# Patient Record
Sex: Male | Born: 1975 | Race: White | Hispanic: No | Marital: Single | Smoking: Former smoker
Health system: Southern US, Community
[De-identification: ages and names within clinical notes are randomized; demographics above are authoritative.]

## PROBLEM LIST (undated history)

## (undated) DIAGNOSIS — I5030 Unspecified diastolic (congestive) heart failure: Secondary | ICD-10-CM

## (undated) DIAGNOSIS — N509 Disorder of male genital organs, unspecified: Secondary | ICD-10-CM

## (undated) DIAGNOSIS — N183 Chronic kidney disease, stage 3 unspecified: Secondary | ICD-10-CM

## (undated) DIAGNOSIS — J449 Chronic obstructive pulmonary disease, unspecified: Secondary | ICD-10-CM

## (undated) DIAGNOSIS — I255 Ischemic cardiomyopathy: Secondary | ICD-10-CM

## (undated) DIAGNOSIS — L03119 Cellulitis of unspecified part of limb: Secondary | ICD-10-CM

## (undated) DIAGNOSIS — I2510 Atherosclerotic heart disease of native coronary artery without angina pectoris: Secondary | ICD-10-CM

## (undated) DIAGNOSIS — I10 Essential (primary) hypertension: Secondary | ICD-10-CM

## (undated) DIAGNOSIS — Z7902 Long term (current) use of antithrombotics/antiplatelets: Secondary | ICD-10-CM

## (undated) DIAGNOSIS — G4733 Obstructive sleep apnea (adult) (pediatric): Secondary | ICD-10-CM

## (undated) DIAGNOSIS — I700 Atherosclerosis of aorta: Secondary | ICD-10-CM

## (undated) DIAGNOSIS — I219 Acute myocardial infarction, unspecified: Secondary | ICD-10-CM

## (undated) DIAGNOSIS — F1910 Other psychoactive substance abuse, uncomplicated: Secondary | ICD-10-CM

## (undated) DIAGNOSIS — G4730 Sleep apnea, unspecified: Secondary | ICD-10-CM

## (undated) DIAGNOSIS — R3911 Hesitancy of micturition: Secondary | ICD-10-CM

## (undated) DIAGNOSIS — K449 Diaphragmatic hernia without obstruction or gangrene: Secondary | ICD-10-CM

## (undated) DIAGNOSIS — I209 Angina pectoris, unspecified: Secondary | ICD-10-CM

## (undated) DIAGNOSIS — D72829 Elevated white blood cell count, unspecified: Secondary | ICD-10-CM

## (undated) DIAGNOSIS — K219 Gastro-esophageal reflux disease without esophagitis: Secondary | ICD-10-CM

## (undated) DIAGNOSIS — E785 Hyperlipidemia, unspecified: Secondary | ICD-10-CM

## (undated) DIAGNOSIS — Z7982 Long term (current) use of aspirin: Secondary | ICD-10-CM

## (undated) DIAGNOSIS — A630 Anogenital (venereal) warts: Secondary | ICD-10-CM

## (undated) DIAGNOSIS — F1410 Cocaine abuse, uncomplicated: Secondary | ICD-10-CM

## (undated) DIAGNOSIS — I5032 Chronic diastolic (congestive) heart failure: Secondary | ICD-10-CM

## (undated) DIAGNOSIS — N179 Acute kidney failure, unspecified: Secondary | ICD-10-CM

## (undated) HISTORY — DX: Atherosclerotic heart disease of native coronary artery without angina pectoris: I25.10

## (undated) HISTORY — PX: IRRIGATION AND DEBRIDEMENT KNEE: SHX5185

## (undated) HISTORY — PX: TYMPANOSTOMY TUBE PLACEMENT: SHX32

---

## 2004-02-21 ENCOUNTER — Emergency Department: Payer: Self-pay | Admitting: Emergency Medicine

## 2007-09-29 ENCOUNTER — Inpatient Hospital Stay: Payer: Self-pay | Admitting: Internal Medicine

## 2008-05-10 ENCOUNTER — Emergency Department: Payer: Self-pay | Admitting: Emergency Medicine

## 2013-06-30 ENCOUNTER — Emergency Department: Payer: Self-pay | Admitting: Emergency Medicine

## 2013-06-30 LAB — URINALYSIS, COMPLETE
Bacteria: NONE SEEN
Bilirubin,UR: NEGATIVE
Glucose,UR: NEGATIVE mg/dL (ref 0–75)
Ketone: NEGATIVE
Leukocyte Esterase: NEGATIVE
Nitrite: NEGATIVE
Ph: 7 (ref 4.5–8.0)
SPECIFIC GRAVITY: 1.015 (ref 1.003–1.030)
WBC UR: <1 /HPF (ref 0–5)

## 2013-06-30 LAB — CBC WITH DIFFERENTIAL/PLATELET
Basophil #: 0.1 10*3/uL (ref 0.0–0.1)
Basophil %: 1.1 %
EOS ABS: 0.3 10*3/uL (ref 0.0–0.7)
EOS PCT: 4.7 %
HCT: 49 % (ref 40.0–52.0)
HGB: 16.7 g/dL (ref 13.0–18.0)
LYMPHS ABS: 2.1 10*3/uL (ref 1.0–3.6)
Lymphocyte %: 30 %
MCH: 32.4 pg (ref 26.0–34.0)
MCHC: 34.2 g/dL (ref 32.0–36.0)
MCV: 95 fL (ref 80–100)
Monocyte #: 0.7 x10 3/mm (ref 0.2–1.0)
Monocyte %: 10.5 %
NEUTROS ABS: 3.8 10*3/uL (ref 1.4–6.5)
Neutrophil %: 53.7 %
Platelet: 242 10*3/uL (ref 150–440)
RBC: 5.17 10*6/uL (ref 4.40–5.90)
RDW: 13.1 % (ref 11.5–14.5)
WBC: 7.1 10*3/uL (ref 3.8–10.6)

## 2013-06-30 LAB — BASIC METABOLIC PANEL
Anion Gap: 5 — ABNORMAL LOW (ref 7–16)
BUN: 9 mg/dL (ref 7–18)
CHLORIDE: 105 mmol/L (ref 98–107)
Calcium, Total: 8.8 mg/dL (ref 8.5–10.1)
Co2: 29 mmol/L (ref 21–32)
Creatinine: 1.31 mg/dL — ABNORMAL HIGH (ref 0.60–1.30)
GLUCOSE: 114 mg/dL — AB (ref 65–99)
Osmolality: 277 (ref 275–301)
POTASSIUM: 3.7 mmol/L (ref 3.5–5.1)
SODIUM: 139 mmol/L (ref 136–145)

## 2013-06-30 LAB — TROPONIN I

## 2013-10-03 ENCOUNTER — Emergency Department: Payer: Self-pay | Admitting: Student

## 2013-10-03 LAB — CBC WITH DIFFERENTIAL/PLATELET
Basophil #: 0.1 10*3/uL (ref 0.0–0.1)
Basophil %: 1.1 %
EOS PCT: 3.8 %
Eosinophil #: 0.5 10*3/uL (ref 0.0–0.7)
HCT: 48.3 % (ref 40.0–52.0)
HGB: 16.3 g/dL (ref 13.0–18.0)
Lymphocyte #: 2 10*3/uL (ref 1.0–3.6)
Lymphocyte %: 16.7 %
MCH: 32.3 pg (ref 26.0–34.0)
MCHC: 33.8 g/dL (ref 32.0–36.0)
MCV: 95 fL (ref 80–100)
MONO ABS: 0.9 x10 3/mm (ref 0.2–1.0)
MONOS PCT: 7.2 %
Neutrophil #: 8.7 10*3/uL — ABNORMAL HIGH (ref 1.4–6.5)
Neutrophil %: 71.2 %
PLATELETS: 239 10*3/uL (ref 150–440)
RBC: 5.06 10*6/uL (ref 4.40–5.90)
RDW: 12.5 % (ref 11.5–14.5)
WBC: 12.2 10*3/uL — AB (ref 3.8–10.6)

## 2013-10-03 LAB — BASIC METABOLIC PANEL
Anion Gap: 8 (ref 7–16)
BUN: 12 mg/dL (ref 7–18)
CO2: 26 mmol/L (ref 21–32)
Calcium, Total: 8.4 mg/dL — ABNORMAL LOW (ref 8.5–10.1)
Chloride: 106 mmol/L (ref 98–107)
Creatinine: 1.39 mg/dL — ABNORMAL HIGH (ref 0.60–1.30)
EGFR (African American): >60
EGFR (Non-African Amer.): >60
Glucose: 80 mg/dL (ref 65–99)
Osmolality: 278 (ref 275–301)
POTASSIUM: 3.8 mmol/L (ref 3.5–5.1)
SODIUM: 140 mmol/L (ref 136–145)

## 2013-10-08 LAB — CULTURE, BLOOD (SINGLE)

## 2013-11-28 ENCOUNTER — Emergency Department: Payer: Self-pay | Admitting: Emergency Medicine

## 2014-10-20 DIAGNOSIS — I10 Essential (primary) hypertension: Secondary | ICD-10-CM | POA: Diagnosis present

## 2015-05-11 IMAGING — CR RIGHT MIDDLE FINGER 2+V
1 series · 3 of 3 positions shown · non-contrast
Comparison: None.

CLINICAL DATA: Pain and swelling after getting metal in middle
finger last week.

EXAM:
RIGHT MIDDLE FINGER 2+V

[Series 1: pa · 0.17mm/px · 3 of 3 slices shown]
[im 1/3]
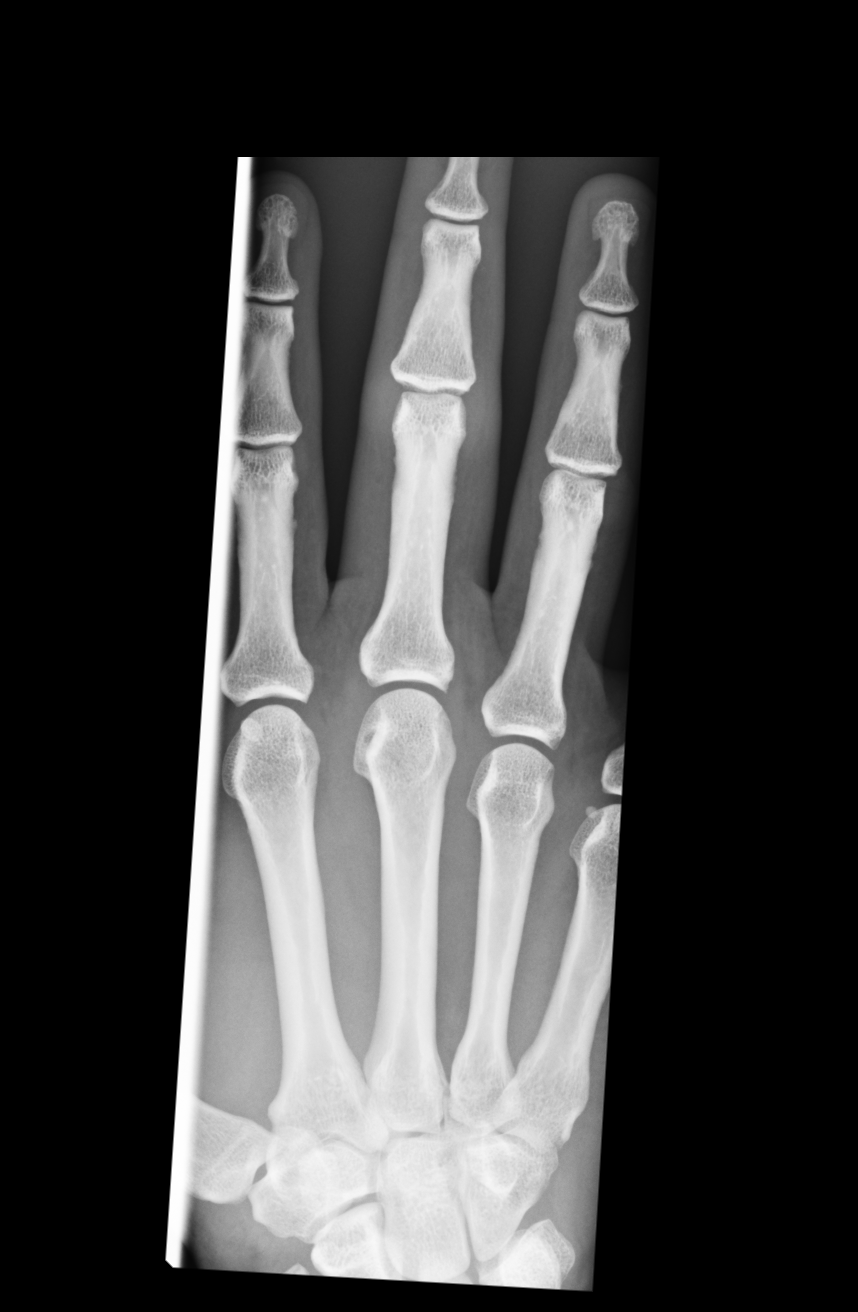
[im 2/3]
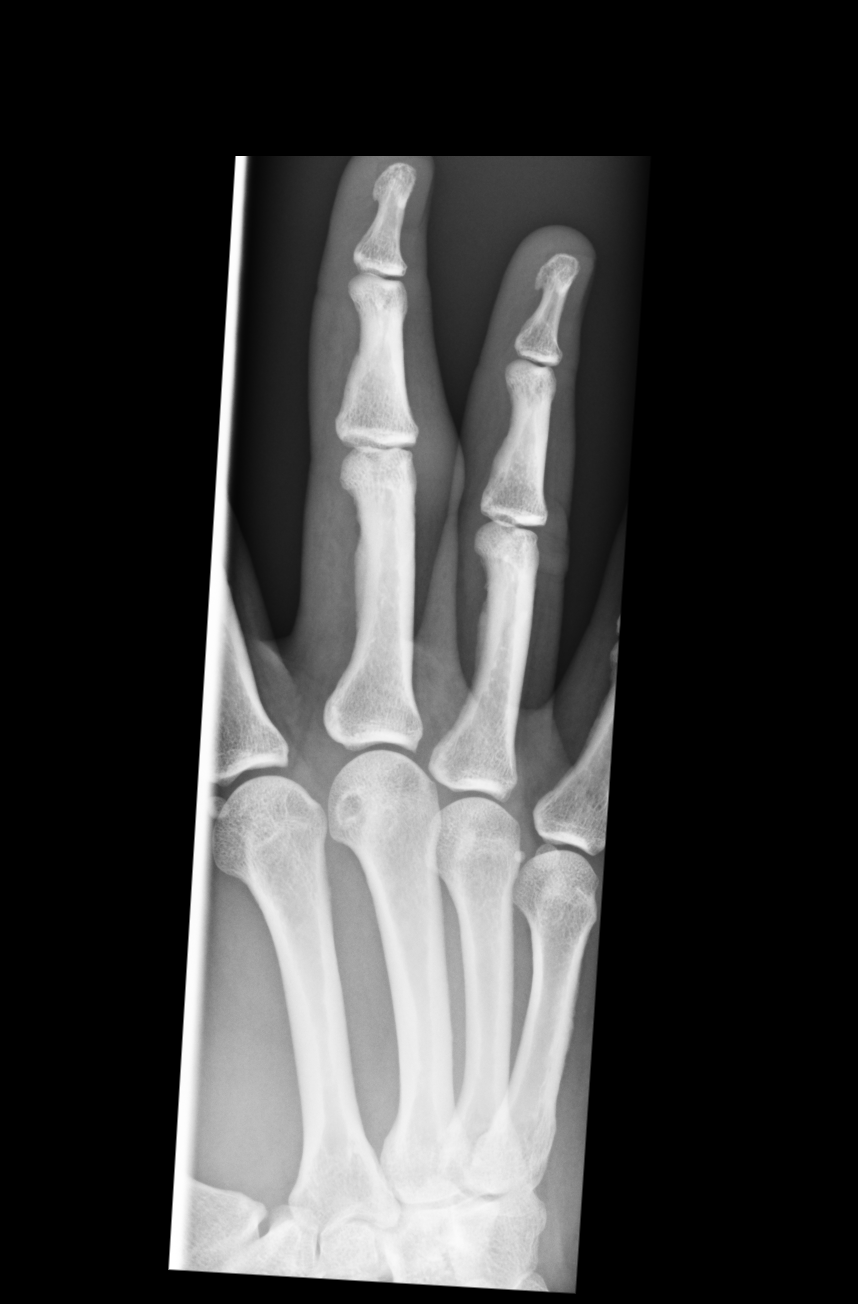
[im 3/3]
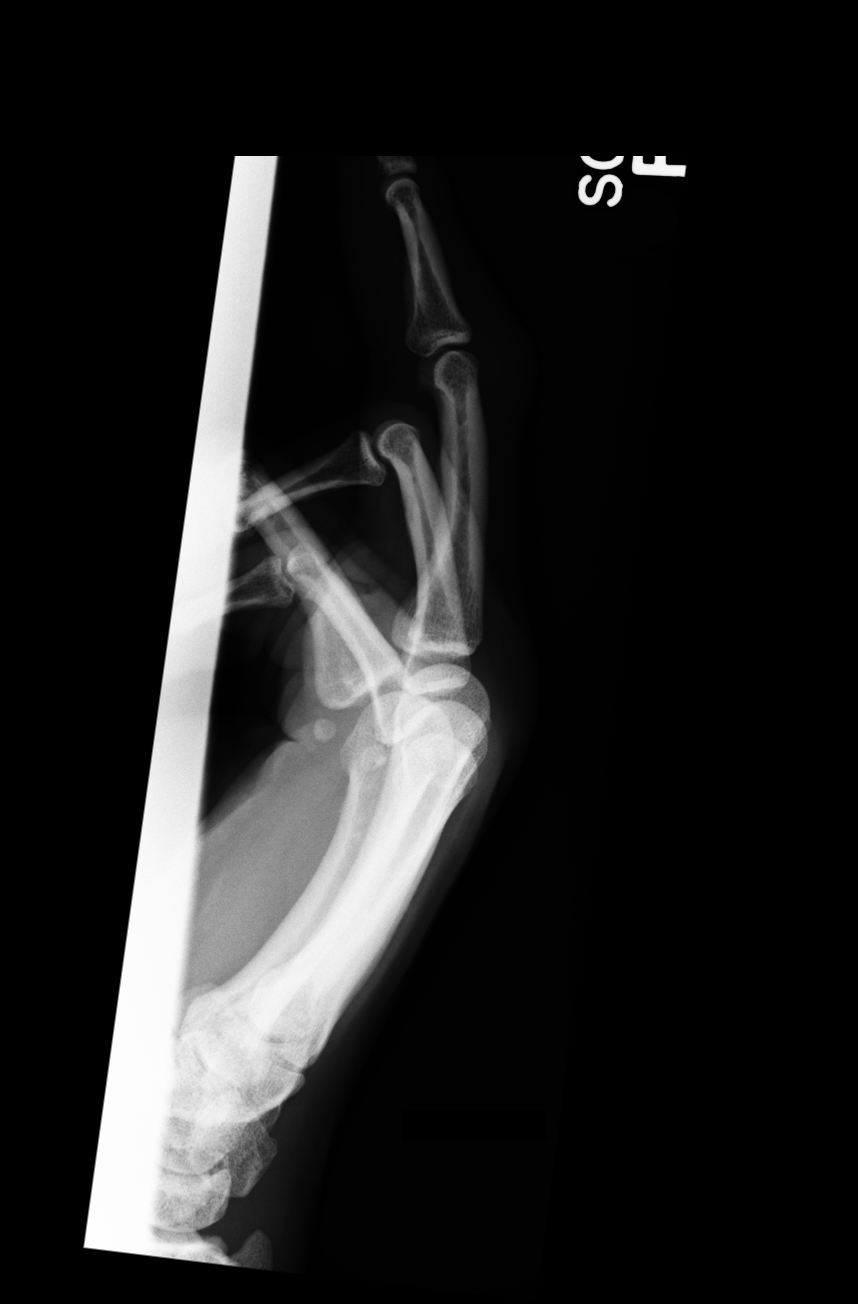

[3 of 3 positions shown; findings below may reference images not displayed]

FINDINGS: Soft tissue swelling, centered about the proximal interphalangeal
joint. No osseous destruction, fracture, or dislocation. Lateral
view is overpenetrated. No evidence of radiopaque or metallic
foreign object.
IMPRESSION: Soft tissue swelling, without radiopaque foreign object.

## 2016-07-10 DIAGNOSIS — E785 Hyperlipidemia, unspecified: Secondary | ICD-10-CM | POA: Diagnosis present

## 2016-07-10 DIAGNOSIS — E7849 Other hyperlipidemia: Secondary | ICD-10-CM | POA: Diagnosis present

## 2017-01-24 ENCOUNTER — Encounter: Payer: Self-pay | Admitting: Emergency Medicine

## 2017-01-24 ENCOUNTER — Emergency Department
Admission: EM | Admit: 2017-01-24 | Discharge: 2017-01-24 | Disposition: A | Discharge status: Home / Self Care | Payer: No Typology Code available for payment source | Attending: Emergency Medicine | Admitting: Emergency Medicine

## 2017-01-24 ENCOUNTER — Emergency Department: Payer: No Typology Code available for payment source

## 2017-01-24 ENCOUNTER — Other Ambulatory Visit: Payer: Self-pay

## 2017-01-24 DIAGNOSIS — Y92410 Unspecified street and highway as the place of occurrence of the external cause: Secondary | ICD-10-CM | POA: Insufficient documentation

## 2017-01-24 DIAGNOSIS — Y9389 Activity, other specified: Secondary | ICD-10-CM | POA: Diagnosis not present

## 2017-01-24 DIAGNOSIS — S39012A Strain of muscle, fascia and tendon of lower back, initial encounter: Secondary | ICD-10-CM | POA: Insufficient documentation

## 2017-01-24 DIAGNOSIS — Y999 Unspecified external cause status: Secondary | ICD-10-CM | POA: Diagnosis not present

## 2017-01-24 DIAGNOSIS — I10 Essential (primary) hypertension: Secondary | ICD-10-CM | POA: Diagnosis not present

## 2017-01-24 DIAGNOSIS — F17210 Nicotine dependence, cigarettes, uncomplicated: Secondary | ICD-10-CM | POA: Diagnosis not present

## 2017-01-24 DIAGNOSIS — S0990XA Unspecified injury of head, initial encounter: Secondary | ICD-10-CM | POA: Diagnosis present

## 2017-01-24 DIAGNOSIS — S161XXA Strain of muscle, fascia and tendon at neck level, initial encounter: Secondary | ICD-10-CM | POA: Diagnosis not present

## 2017-01-24 HISTORY — DX: Essential (primary) hypertension: I10

## 2017-01-24 MED ORDER — OXYCODONE-ACETAMINOPHEN 5-325 MG PO TABS
1.0000 | ORAL_TABLET | Freq: Once | ORAL | Status: AC
  Administered 2017-01-24: 1 via ORAL
  Filled 2017-01-24: qty 1

## 2017-01-24 MED ORDER — CYCLOBENZAPRINE HCL 10 MG PO TABS: 10.0000 mg | ORAL_TABLET | Freq: Three times a day (TID) | ORAL | 0 refills | Status: DC | PRN
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
## 2017-01-24 NOTE — ED Notes (Signed)

## 2019-11-25 ENCOUNTER — Emergency Department: Payer: HRSA Program

## 2019-11-25 ENCOUNTER — Observation Stay
Admission: EM | Admit: 2019-11-25 | Discharge: 2019-11-27 | Disposition: A | Discharge status: Home / Self Care | Payer: HRSA Program | Attending: Internal Medicine | Admitting: Internal Medicine

## 2019-11-25 ENCOUNTER — Other Ambulatory Visit: Payer: Self-pay

## 2019-11-25 DIAGNOSIS — R9431 Abnormal electrocardiogram [ECG] [EKG]: Secondary | ICD-10-CM | POA: Diagnosis not present

## 2019-11-25 DIAGNOSIS — U071 COVID-19: Secondary | ICD-10-CM

## 2019-11-25 DIAGNOSIS — N489 Disorder of penis, unspecified: Secondary | ICD-10-CM | POA: Diagnosis present

## 2019-11-25 DIAGNOSIS — R739 Hyperglycemia, unspecified: Secondary | ICD-10-CM | POA: Diagnosis present

## 2019-11-25 DIAGNOSIS — L03115 Cellulitis of right lower limb: Secondary | ICD-10-CM | POA: Diagnosis not present

## 2019-11-25 DIAGNOSIS — I10 Essential (primary) hypertension: Secondary | ICD-10-CM | POA: Diagnosis present

## 2019-11-25 DIAGNOSIS — E785 Hyperlipidemia, unspecified: Secondary | ICD-10-CM | POA: Diagnosis present

## 2019-11-25 DIAGNOSIS — R062 Wheezing: Secondary | ICD-10-CM | POA: Diagnosis present

## 2019-11-25 DIAGNOSIS — R55 Syncope and collapse: Secondary | ICD-10-CM | POA: Diagnosis present

## 2019-11-25 DIAGNOSIS — L03119 Cellulitis of unspecified part of limb: Secondary | ICD-10-CM | POA: Diagnosis present

## 2019-11-25 DIAGNOSIS — N4889 Other specified disorders of penis: Secondary | ICD-10-CM | POA: Diagnosis not present

## 2019-11-25 DIAGNOSIS — Z720 Tobacco use: Secondary | ICD-10-CM | POA: Diagnosis present

## 2019-11-25 DIAGNOSIS — Z79899 Other long term (current) drug therapy: Secondary | ICD-10-CM | POA: Insufficient documentation

## 2019-11-25 DIAGNOSIS — R7989 Other specified abnormal findings of blood chemistry: Secondary | ICD-10-CM | POA: Diagnosis present

## 2019-11-25 DIAGNOSIS — Z8616 Personal history of COVID-19: Secondary | ICD-10-CM

## 2019-11-25 DIAGNOSIS — E663 Overweight: Secondary | ICD-10-CM

## 2019-11-25 DIAGNOSIS — E7849 Other hyperlipidemia: Secondary | ICD-10-CM | POA: Diagnosis present

## 2019-11-25 DIAGNOSIS — Z6827 Body mass index (BMI) 27.0-27.9, adult: Secondary | ICD-10-CM

## 2019-11-25 DIAGNOSIS — Z87891 Personal history of nicotine dependence: Secondary | ICD-10-CM | POA: Diagnosis present

## 2019-11-25 DIAGNOSIS — R778 Other specified abnormalities of plasma proteins: Secondary | ICD-10-CM | POA: Diagnosis present

## 2019-11-25 HISTORY — DX: Hyperlipidemia, unspecified: E78.5

## 2019-11-25 HISTORY — DX: Personal history of COVID-19: Z86.16

## 2019-11-25 HISTORY — DX: Tobacco use: Z72.0

## 2019-11-25 HISTORY — DX: COVID-19: U07.1

## 2019-11-25 LAB — URINALYSIS, COMPLETE (UACMP) WITH MICROSCOPIC
Bacteria, UA: NONE SEEN
Bilirubin Urine: NEGATIVE
Glucose, UA: NEGATIVE mg/dL
Ketones, ur: NEGATIVE mg/dL
Leukocytes,Ua: NEGATIVE
Nitrite: NEGATIVE
Protein, ur: 30 mg/dL — AB
Specific Gravity, Urine: 1.018 (ref 1.005–1.030)
pH: 5 (ref 5.0–8.0)

## 2019-11-25 LAB — CBC
HCT: 48 % (ref 39.0–52.0)
Hemoglobin: 16.7 g/dL (ref 13.0–17.0)
MCH: 30.7 pg (ref 26.0–34.0)
MCHC: 34.8 g/dL (ref 30.0–36.0)
MCV: 88.2 fL (ref 80.0–100.0)
Platelets: 263 10*3/uL (ref 150–400)
RBC: 5.44 MIL/uL (ref 4.22–5.81)
RDW: 12.7 % (ref 11.5–15.5)
WBC: 5.6 10*3/uL (ref 4.0–10.5)
nRBC: 0 % (ref 0.0–0.2)

## 2019-11-25 LAB — HEPATIC FUNCTION PANEL
ALT: 18 U/L (ref 0–44)
AST: 20 U/L (ref 15–41)
Albumin: 3.7 g/dL (ref 3.5–5.0)
Alkaline Phosphatase: 74 U/L (ref 38–126)
Bilirubin, Direct: <0.1 mg/dL (ref 0.0–0.2)
Total Bilirubin: 0.5 mg/dL (ref 0.3–1.2)
Total Protein: 7.1 g/dL (ref 6.5–8.1)

## 2019-11-25 LAB — BASIC METABOLIC PANEL
Anion gap: 10 (ref 5–15)
BUN: 17 mg/dL (ref 6–20)
CO2: 22 mmol/L (ref 22–32)
Calcium: 9.1 mg/dL (ref 8.9–10.3)
Chloride: 107 mmol/L (ref 98–111)
Creatinine, Ser: 1.09 mg/dL (ref 0.61–1.24)
GFR calc Af Amer: >60 mL/min (ref >60)
GFR calc non Af Amer: >60 mL/min (ref >60)
Glucose, Bld: 114 mg/dL — ABNORMAL HIGH (ref 70–99)
Potassium: 4.2 mmol/L (ref 3.5–5.1)
Sodium: 139 mmol/L (ref 135–145)

## 2019-11-25 LAB — RESPIRATORY PANEL BY RT PCR (FLU A&B, COVID)
Influenza A by PCR: NEGATIVE
Influenza B by PCR: NEGATIVE
SARS Coronavirus 2 by RT PCR: POSITIVE — AB

## 2019-11-25 LAB — TROPONIN I (HIGH SENSITIVITY)
Troponin I (High Sensitivity): 16 ng/L (ref <18)
Troponin I (High Sensitivity): 32 ng/L — ABNORMAL HIGH (ref <18)
Troponin I (High Sensitivity): 24 ng/L — ABNORMAL HIGH (ref <18)

## 2019-11-25 LAB — BRAIN NATRIURETIC PEPTIDE: B Natriuretic Peptide: 27.6 pg/mL (ref 0.0–100.0)

## 2019-11-25 MED ORDER — METHYLPREDNISOLONE SODIUM SUCC 125 MG IJ SOLR: 80.0000 mg | Freq: Once | INTRAMUSCULAR | Status: DC | PRN

## 2019-11-25 MED ORDER — ENOXAPARIN SODIUM 40 MG/0.4ML ~~LOC~~ SOLN
40.0000 mg | SUBCUTANEOUS | Status: DC
  Administered 2019-11-25 – 2019-11-26 (×2): 40 mg via SUBCUTANEOUS
  Filled 2019-11-25 (×2): qty 0.4

## 2019-11-25 MED ORDER — FAMOTIDINE IN NACL 20-0.9 MG/50ML-% IV SOLN: 20.0000 mg | Freq: Once | INTRAVENOUS | Status: DC | PRN

## 2019-11-25 MED ORDER — ONDANSETRON HCL 4 MG/2ML IJ SOLN: 4.0000 mg | Freq: Four times a day (QID) | INTRAMUSCULAR | Status: DC | PRN

## 2019-11-25 MED ORDER — ACETAMINOPHEN 650 MG RE SUPP: 650.0000 mg | Freq: Four times a day (QID) | RECTAL | Status: DC | PRN

## 2019-11-25 MED ORDER — ACETAMINOPHEN 325 MG PO TABS: 650.0000 mg | ORAL_TABLET | Freq: Four times a day (QID) | ORAL | Status: DC | PRN

## 2019-11-25 MED ORDER — METHYLPREDNISOLONE SODIUM SUCC 125 MG IJ SOLR: 125.0000 mg | Freq: Once | INTRAMUSCULAR | Status: DC | PRN

## 2019-11-25 MED ORDER — HYDRALAZINE HCL 20 MG/ML IJ SOLN
10.0000 mg | Freq: Four times a day (QID) | INTRAMUSCULAR | Status: DC | PRN
  Administered 2019-11-25 – 2019-11-26 (×2): 10 mg via INTRAVENOUS
  Filled 2019-11-25 (×2): qty 1

## 2019-11-25 MED ORDER — ONDANSETRON HCL 4 MG PO TABS: 4.0000 mg | ORAL_TABLET | Freq: Four times a day (QID) | ORAL | Status: DC | PRN

## 2019-11-25 MED ORDER — LABETALOL HCL 5 MG/ML IV SOLN
10.0000 mg | INTRAVENOUS | Status: DC | PRN
  Administered 2019-11-26 (×2): 10 mg via INTRAVENOUS
  Filled 2019-11-25 (×2): qty 4

## 2019-11-25 MED ORDER — CEPHALEXIN 500 MG PO CAPS: 500.0000 mg | ORAL_CAPSULE | Freq: Three times a day (TID) | ORAL | Status: DC

## 2019-11-25 MED ORDER — METHYLPREDNISOLONE SODIUM SUCC 40 MG IJ SOLR
40.0000 mg | Freq: Once | INTRAMUSCULAR | Status: AC
  Administered 2019-11-25: 22:00:00 40 mg via INTRAVENOUS
  Filled 2019-11-25: qty 1

## 2019-11-25 MED ORDER — ENOXAPARIN SODIUM 40 MG/0.4ML ~~LOC~~ SOLN: 40.0000 mg | SUBCUTANEOUS | Status: DC

## 2019-11-25 MED ORDER — EPINEPHRINE 0.3 MG/0.3ML IJ SOAJ
0.3000 mg | Freq: Once | INTRAMUSCULAR | Status: DC | PRN
  Filled 2019-11-25: qty 0.3

## 2019-11-25 MED ORDER — CEPHALEXIN 500 MG PO CAPS
500.0000 mg | ORAL_CAPSULE | Freq: Four times a day (QID) | ORAL | Status: DC
  Administered 2019-11-25 – 2019-11-27 (×8): 500 mg via ORAL
  Filled 2019-11-25 (×7): qty 1

## 2019-11-25 MED ORDER — ALBUTEROL SULFATE HFA 108 (90 BASE) MCG/ACT IN AERS
2.0000 | INHALATION_SPRAY | Freq: Once | RESPIRATORY_TRACT | Status: DC | PRN
  Filled 2019-11-25: qty 6.7

## 2019-11-25 MED ORDER — SODIUM CHLORIDE 0.9% FLUSH
3.0000 mL | Freq: Two times a day (BID) | INTRAVENOUS | Status: DC
  Administered 2019-11-25 – 2019-11-27 (×4): 3 mL via INTRAVENOUS

## 2019-11-25 MED ORDER — NICOTINE 21 MG/24HR TD PT24: 21.0000 mg | MEDICATED_PATCH | Freq: Every day | TRANSDERMAL | Status: DC | PRN

## 2019-11-25 MED ORDER — SODIUM CHLORIDE 0.9 % IV SOLN
1200.0000 mg | Freq: Once | INTRAVENOUS | Status: AC
  Administered 2019-11-25: 22:00:00 1200 mg via INTRAVENOUS
  Filled 2019-11-25: qty 10

## 2019-11-25 MED ORDER — SODIUM CHLORIDE 0.9 % IV SOLN: INTRAVENOUS | Status: DC | PRN

## 2019-11-25 MED ORDER — MELATONIN 5 MG PO TABS
5.0000 mg | ORAL_TABLET | Freq: Every day | ORAL | Status: DC
  Administered 2019-11-26 (×2): 5 mg via ORAL
  Filled 2019-11-25 (×3): qty 1

## 2019-11-25 MED ORDER — IPRATROPIUM-ALBUTEROL 20-100 MCG/ACT IN AERS
2.0000 | INHALATION_SPRAY | Freq: Four times a day (QID) | RESPIRATORY_TRACT | Status: DC
  Administered 2019-11-25 – 2019-11-27 (×7): 2 via RESPIRATORY_TRACT
  Filled 2019-11-25: qty 4

## 2019-11-25 MED ORDER — DIPHENHYDRAMINE HCL 50 MG/ML IJ SOLN: 50.0000 mg | Freq: Once | INTRAMUSCULAR | Status: DC | PRN

## 2019-11-25 NOTE — ED Notes (Signed)
First Nurse Note: Pt to ED from [facility] for syncopal episode last night and multiple pre syncopal episodes this morning. Pt is currently c/o dizziness

## 2019-11-25 NOTE — ED Notes (Signed)
See triage note  Presents after having a syncopal episode last pm  But states he felt like he was going to pass out several times today

## 2019-11-25 NOTE — ED Notes (Signed)
Repeat EKG completed per request of MD Isaacs.

## 2019-11-25 NOTE — ED Triage Notes (Signed)
Pt comes via POV from home with c/o LOC. Pt states yesterday he was just sitting on couch watching TV and passed out. Pt states pain to left arm. Pt states no CP really.  Pt states some SOB.

## 2019-11-25 NOTE — H&P (Signed)
History and Physical    Logan Crawford TKZ:[id number] DOB: [birth year] DOA: 11/25/2019  PCP: Pcp, No   Patient coming from: Home.   I have personally briefly reviewed patient's old medical records in [facility]  Chief Complaint: Loss of consciousness.  HPI: Logan Crawford is a 44 y.o. male with medical history significant of hypertension, hyperlipidemia, tobacco use who is coming to the emergency department with a history of progressively worse weakness for the past 2 to 3 weeks associated with diarrhea earlier in the course of it, fatigue, dyspnea, wheezing, occasional nonproductive cough (particularly since yesterday) which he thinks led to him losing consciousness at home while he was sitting in his couch.  He denies any prodromal symptoms like palpitations, dizziness, diaphoresis or dyspnea.  Today he felt pressure-like, left chest pain radiated to his left arm associated with fainting sensation, but did not lose consciousness.  His chest pain resolved shortly after that.  He was chest pain-free by the time he arrived to the emergency department. He denies PND, orthopnea or pitting edema of the lower extremities. He denies fever, but complains of chills occasional night sweats.  No rhinorrhea, sore throat, but state he gets wheezing often.  He smokes almost a pack of cigarettes a day.  No abdominal pain, nausea, emesis, constipation, melena or hematochezia.  Denies dysuria, frequency hematuria.  No polyuria, polydipsia, polyphagia or blurred vision.  He states that he has a recurrent skin lesion on his inner right thigh that occasionally gets painful, swells up and then drains.  This is also affecting his glans.  He mentioned that he had a similar picture years ago, was treated with antibiotics and refer to the dermatologist for the penile lesion, which he says is mostly painless.  ED Course: His temperature was 98 F, pulse 96, RR 16, blood pressure 132/91 mmHg O2 sat 100%.  Cardiology  was consulted (Dr. Gala Romney) who suggested a repeat troponin and echocardiogram.  Discharge home if normal, but his second troponin doubled up on the second troponin measurement.  Urinalysis shows small hemoglobinuria and proteinuria 30 mg/dL.  Covid 19 PCR was positive.  CBC was normal.  BMP showed a glucose of 114 mg/dL, but all other values were normal.  LFTs were normal.  Troponin was 16 and then 32 ng/L.  BNP was 27.6 pg/mL.  EKG is sinus rhythm, but is showing now T wave inversion on the lateral leads when compared from his previous EKG from 2015.  His chest radiograph did not have any acute cardiopulmonary normality.  Review of Systems: As per HPI otherwise all other systems reviewed and are negative.  Past Medical History:  Diagnosis Date  . Hyperlipidemia   . Hypertension   . Tobacco use 11/25/2019   Past Surgical History:  Procedure Laterality Date  . IRRIGATION AND DEBRIDEMENT KNEE Left   . TYMPANOSTOMY TUBE PLACEMENT Bilateral    Social History  reports that he has been smoking. He has never used smokeless tobacco. He reports current alcohol use. He reports that he does not use drugs.  No Known Allergies  Family History  Problem Relation Age of Onset  . Coronary artery disease Mother   . Arrhythmia Mother        Pacemaker placed in 2021  . Coronary artery disease Father    Prior to Admission medications   Medication Sig Start Date End Date Taking? Authorizing Provider  amLODipine (NORVASC) 10 MG tablet Take 10 mg by mouth daily.   Yes [provider]  atorvastatin (LIPITOR) 20 MG tablet Take 20 mg by mouth daily.   Yes [provider]  sertraline (ZOLOFT) 50 MG tablet Take 50 mg by mouth daily.   Yes [provider]   Physical Exam: Vitals:   11/25/19 1209 11/25/19 1809 11/25/19 2027 11/25/19 2105  BP:  130/88 (!) 177/115 (!) 187/128  Pulse:  88 85 83  Resp:  16 18 16   Temp:    (!) 97.4 F (36.3 C)  TempSrc:    Oral  SpO2:  100% 99%    Weight: 90.7 kg     Height: 6' (1.829 m)      Constitutional: NAD, calm, comfortable Eyes: PERRL, lids and conjunctivae normal mildly injected. ENMT: Mucous membranes are moist. Posterior pharynx clear of any exudate or lesions. Neck: normal, supple, no masses, no thyromegaly Respiratory: Mildly decreased breath sounds with bilateral wheezing and rhonchi, no crackles. Normal respiratory effort. No accessory muscle use.  Cardiovascular: Regular rate and rhythm, no murmurs / rubs / gallops. No extremity edema. 2+ pedal pulses. No carotid bruits.  Abdomen:  Nondistended.  BS positive.  Soft, no tenderness, no masses palpated. No hepatosplenomegaly. GU:  Small punctuated erythematous lesion of the penile glans.  (See picture below) Musculoskeletal: no clubbing / cyanosis.  Good ROM, no contractures. Normal muscle tone.  Skin: There is 3 x 2 cm mildly erythematosus, mildly edematous and tender to palpation area in the patient's right inguinal.  Please see picture below Neurologic: CN 2-12 grossly intact. Sensation intact, DTR normal. Strength 5/5 in all 4.  Psychiatric: Normal judgment and insight. Alert and oriented x 3. Normal mood.         Labs on Admission: I have personally reviewed following labs and imaging studies  CBC: Recent Labs  Lab 11/25/19 1207  WBC 5.6  HGB 16.7  HCT 48.0  MCV 88.2  PLT 263   Basic Metabolic Panel: Recent Labs  Lab 11/25/19 1207  NA 139  K 4.2  CL 107  CO2 22  GLUCOSE 114*  BUN 17  CREATININE 1.09  CALCIUM 9.1   GFR: Estimated Creatinine Clearance: 93.9 mL/min (by C-G formula based on SCr of 1.09 mg/dL).  Liver Function Tests: Recent Labs  Lab 11/25/19 1920  AST 20  ALT 18  ALKPHOS 74  BILITOT 0.5  PROT 7.1  ALBUMIN 3.7   Urine analysis:    Component Value Date/Time   COLORURINE YELLOW (A) 11/25/2019 1759   APPEARANCEUR CLEAR (A) 11/25/2019 1759        LABSPEC 1.018 11/25/2019 1759        PHURINE 5.0 11/25/2019 1759    GLUCOSEU NEGATIVE 11/25/2019 1759        HGBUR SMALL (A) 11/25/2019 1759   BILIRUBINUR NEGATIVE 11/25/2019 1759        KETONESUR NEGATIVE 11/25/2019 1759   PROTEINUR 30 (A) 11/25/2019 1759   NITRITE NEGATIVE 11/25/2019 1759   LEUKOCYTESUR NEGATIVE 11/25/2019 1759        Radiological Exams on Admission: DG Chest 1 View  Result Date: 11/25/2019 CLINICAL DATA:  COVID LOC EXAM: CHEST  1 VIEW COMPARISON:  None. FINDINGS: The heart size and mediastinal contours are within normal limits. Both lungs are clear. The visualized skeletal structures are unremarkable. IMPRESSION: No active disease. Electronically Signed   By: 01/25/2020 M.D.   On: 11/25/2019 15:47   EKG: Independently reviewed.  Vent. rate 86 BPM PR interval 140 ms QRS duration 70 ms QT/QTc 362/433 ms P-R-T axes  63 54 150 Normal sinus rhythm ST & T wave abnormality, consider inferolateral ischemia Abnormal ECG  Assessment/Plan Principal Problem:   Syncopal episodes Observation/telemetry. Fall precaution. Trend troponin level. Check echocardiogram in a.m. Consult cardiology in a.m.  Active Problems:   Elevated troponin   Abnormal EKG Multiple risk factors for CAD: (Age, tobacco use, hypertension, hyperlipidemia, overweight) (Strong nonmodifiable risk factors with CAD history in both parents) Smoking cessation advised multiple times. Resume treatment for hypertension and hyperlipidemia. Lifestyle modifications. Consult cardiology in the morning.    COVID-19 virus infection No significant signs of respiratory disease. His symptoms have mostly been generalized and gastrointestinal. He came to the hospital due to syncopal episode/chest pain. His oxygen saturation and chest radiograph are normal. Monoclonal antibody infusion was ordered.    Wheezing Early lower COVID-19 infection?. Tobacco related chronic airway/lung disease. Smoking cessation advised. Solu-Medrol 40 mg IVP x1 dose. Combivent 2 puffs every 6  hours.    Tobacco use Tobacco cessation strongly advised. Declined nicotine replacement therapy at the moment. Nuclear patch 20 mg daily ordered as needed. Staff to provide tobacco cessation information.    Recurrent cellulitis of thigh Occasional edema and purulent discharge. Cephalexin 500 mg p.o. every 6 hours. Local care of the affected area.. May benefit from weight loss.    Penile lesion Not sure where there is correlation with tight lesion. Check RPR, HIV, hep B surface antigen and antibody. Follow as an outpatient with public health or return to dermatology.    Hyperglycemia This is not a fasting level. Repeat fasting glucose in a.m.    Hypertension Has not taken amlodipine in a while. Restart 5 mg p.o. daily. Monitor blood pressure.    Hyperlipidemia LFTs are normal. Resume atorvastatin 20 mg p.o. daily.    Overweight with body mass index (BMI) of 27 to 27.9 in adult Lifestyle modifications advised given risk for cardiovascular disease.   DVT prophylaxis: Lovenox SQ.   Code Status:   Full code. Family Communication:   Disposition Plan:   Patient is from:  Home.  Anticipated DC to:  Home.  Anticipated DC date:  11/26/2019.  Anticipated DC barriers: Clinical status.  Consults called: Admission status:  Observation/telemetry.  Severity of Illness:  Bobette Mo MD [facility]  How to contact the [facility] Attending or Consulting provider 7A - 7P or covering provider during after hours 7P -7A, for this patient?   1. Check the care team in [facility] and look for a) attending/consulting [facility] provider listed and b) the [facility] team listed 2. Log into [email or web address] and use [facility] universal password to access. If you do not have the password, please contact the hospital operator. 3. Locate the [facility] provider you are looking for under [facility] and page to a number that you can be directly reached. 4. If you still have difficulty reaching the  provider, please page the [facility] (Director on Call) for the Hospitalists listed on amion for assistance.  11/25/2019, 9:14 PM   This document was prepared using Dragon voice recognition software and may contain some unintended transcription.

## 2019-11-25 NOTE — Progress Notes (Signed)
Pharmacy COVID-19 Monoclonal Antibody Screening  Logan Crawford was identified as being not hospitalized with symptoms from Covid-19 on admission but an incidental positive PCR has been documented.  The patient may qualify for the use of monoclonal antibodies (mAB) for COVID-19 viral infection to prevent worsening symptoms stemming from Covid-19 infection.  The patient was identified based on a positive COVID-19 PCR and not requiring the use of supplemental oxygen at this time.  This patient meets the FDA criteria for Emergency Use Authorization of casirivimab/imdevimab or bamlanivimab/etesevimab.  Has a (+) direct SARS-CoV-2 viral test result  Is NOT hospitalized due to COVID-19  Is within 10 days of symptom onset  Has at least one of the high risk factor(s) for progression to severe COVID-19 and/or hospitalization as defined in EUA.  Specific high risk criteria : BMI > 25 and Cardiovascular disease or hypertension  Additionally: The patient has not had a positive COVID-19 PCR in the last 90 days.  The patient is unvaccinated against COVID-19.  Since the patient is unvaccinated and meets high risk criteria, the patient is eligible for mAB administration.   This eligibility and indication for treatment was discussed with the patient's physician: Dr. Robb Matar  Plan: Based on the above discussion, it was decided that the patient will receive one dose of the available COVID-19 mAB combination. Pharmacy will coordinate administration timing with patient's nurse. Recommended infusion monitoring parameters communicated to the nursing team.   [name] 11/25/2019  8:48 PM

## 2019-11-25 NOTE — Plan of Care (Signed)
  Problem: Education: Goal: Knowledge of risk factors and measures for prevention of condition will improve Outcome: Progressing   Problem: Coping: Goal: Psychosocial and spiritual needs will be supported Outcome: Progressing   Problem: Respiratory: Goal: Will maintain a patent airway Outcome: Progressing Goal: Complications related to the disease process, condition or treatment will be avoided or minimized Outcome: Progressing   

## 2019-11-25 NOTE — ED Notes (Signed)
Messaged NP regarding BP. Awaiting orders.

## 2019-11-25 NOTE — ED Provider Notes (Signed)
[facility] Emergency Department Provider Note  Time seen: 8:58 PM  I have reviewed the triage vital signs and the nursing notes.   HISTORY  Chief Complaint Loss of Consciousness   HPI Logan Crawford is a 44 y.o. male with a past medical history of hypertension, hyperlipidemia, presents to the emergency department  for syncopal event.  According to the patient had a loss of consciousness yesterday while sitting on the couch.  States he has had some pain to his left chest and left arm and today felt lightheaded like he was going to pass out again.  Patient does state he has had a slight cough for the past 24 hours but states over the past 2 weeks he has been feeling extremely fatigued.  Denies any chest pain currently.  Denies any fever.  No vomiting or diarrhea.  Past Medical History:  Diagnosis Date  . Hyperlipidemia   . Hypertension   . Tobacco use 11/25/2019    Patient Active Problem List   Diagnosis Date Noted  . Syncopal episodes 11/25/2019  . Tobacco use 11/25/2019  . Hyperglycemia 11/25/2019  . COVID-19 virus infection 11/25/2019  . Elevated troponin 11/25/2019  . Abnormal EKG 11/25/2019  . Wheezing 11/25/2019  . Hypertension   . Hyperlipidemia   . Overweight with body mass index (BMI) of 27 to 27.9 in adult     Past Surgical History:  Procedure Laterality Date  . IRRIGATION AND DEBRIDEMENT KNEE Left   . TYMPANOSTOMY TUBE PLACEMENT Bilateral     Prior to Admission medications   Medication Sig Start Date End Date Taking? Authorizing Provider  amLODipine (NORVASC) 10 MG tablet Take 10 mg by mouth daily.   Yes [provider]  atorvastatin (LIPITOR) 20 MG tablet Take 20 mg by mouth daily.   Yes [provider]  sertraline (ZOLOFT) 50 MG tablet Take 50 mg by mouth daily.   Yes [provider]    No Known Allergies  Family History  Problem Relation Age of Onset  . Coronary artery disease Mother   . Arrhythmia  Mother        Pacemaker placed in 2021  . Coronary artery disease Father     Social History Social History   Tobacco Use  . Smoking status: Current Some Day Smoker  . Smokeless tobacco: Never Used  Substance Use Topics  . Alcohol use: Yes    Comment: rarely  . Drug use: No    Review of Systems Constitutional: Negative for fever.  Positive for generalized fatigue. Cardiovascular: Chest tightness yesterday, now resolved.  Syncopal episode yesterday. Respiratory: Negative for shortness of breath.  Slight cough x2 to 3 days. Gastrointestinal: Negative for abdominal pain, vomiting and diarrhea. Musculoskeletal: Negative for musculoskeletal complaints Neurological: Negative for headache All other ROS negative  ____________________________________________   PHYSICAL EXAM:  VITAL SIGNS: ED Triage Vitals  Enc Vitals Group     BP 11/25/19 1204 (!) 132/91     Pulse Rate 11/25/19 1204 96     Resp 11/25/19 1204 18     Temp 11/25/19 1204 98 F (36.7 C)     Temp src --      SpO2 11/25/19 1204 100 %     Weight 11/25/19 1209 200 lb (90.7 kg)     Height 11/25/19 1209 6' (1.829 m)     Head Circumference --      Peak Flow --      Pain Score 11/25/19 1206 0  Pain Loc --      Pain Edu? --      Excl. in GC? --     Constitutional: Alert and oriented. Well appearing and in no distress. Eyes: Normal exam ENT      Head: Normocephalic and atraumatic.      Mouth/Throat: Mucous membranes are moist. Cardiovascular: Normal rate, regular rhythm. Respiratory: Normal respiratory effort without tachypnea nor retractions. Breath sounds are clear  Gastrointestinal: Soft and nontender. No distention.  Musculoskeletal: Nontender with normal range of motion in all extremities.  Neurologic:  Normal speech and language. No gross focal neurologic deficits Skin:  Skin is warm, dry and intact.  Psychiatric: Mood and affect are normal.  ____________________________________________     EKG  EKG viewed and interpreted by myself shows a sinus rhythm 86 bpm with a narrow QRS, normal axis, normal intervals.  Patient has lateral T wave inversions, new from prior EKG 5+ years ago.  ____________________________________________    RADIOLOGY  X-rays negative  ____________________________________________   INITIAL IMPRESSION / ASSESSMENT AND PLAN / ED COURSE  Pertinent labs & imaging results that were available during my care of the patient were reviewed by me and considered in my medical decision making (see chart for details).   Patient presents to the emergency department with symptoms of generalized fatigue x1 to 2 weeks, syncopal episode with chest discomfort yesterday, lightheadedness again today.  Patient has not been vaccinated against Covid, Covid test today is positive.  Patient's initial troponin of 16 however given the patient's EKG findings which are changed from his prior I spoke to Dr. Adriana Reams who recommends repeat heart enzyme if unchanged could be worked up as an outpatient.  However patient's repeat enzyme has doubled to 32.  Given the patient's increasing troponin with EKG changes and Covid positive status we will admit to the hospitalist service for further work-up and treatment and likely echocardiogram.  Patient agreeable to plan of care.  Logan Crawford was evaluated in Emergency Department on 11/25/2019 for the symptoms described in the history of present illness. He was evaluated in the context of the global COVID-19 pandemic, which necessitated consideration that the patient might be at risk for infection with the SARS-CoV-2 virus that causes COVID-19. Institutional protocols and algorithms that pertain to the evaluation of patients at risk for COVID-19 are in a state of rapid change based on information released by regulatory bodies including the CDC and federal and state organizations. These policies and algorithms were followed during the patient's  care in the ED.  ____________________________________________   FINAL CLINICAL IMPRESSION(S) / ED DIAGNOSES  COVID-19 Elevated troponin Abnormal EKG   Minna Antis, MD 11/25/19 2102

## 2019-11-26 ENCOUNTER — Observation Stay
Admit: 2019-11-26 | Discharge: 2019-11-26 | Disposition: A | Discharge status: Home / Self Care | Payer: HRSA Program | Attending: Internal Medicine | Admitting: Internal Medicine

## 2019-11-26 DIAGNOSIS — R55 Syncope and collapse: Secondary | ICD-10-CM | POA: Diagnosis not present

## 2019-11-26 DIAGNOSIS — R778 Other specified abnormalities of plasma proteins: Secondary | ICD-10-CM | POA: Diagnosis not present

## 2019-11-26 DIAGNOSIS — L03119 Cellulitis of unspecified part of limb: Secondary | ICD-10-CM

## 2019-11-26 DIAGNOSIS — U071 COVID-19: Secondary | ICD-10-CM | POA: Diagnosis not present

## 2019-11-26 DIAGNOSIS — E663 Overweight: Secondary | ICD-10-CM

## 2019-11-26 DIAGNOSIS — R9431 Abnormal electrocardiogram [ECG] [EKG]: Secondary | ICD-10-CM | POA: Diagnosis not present

## 2019-11-26 DIAGNOSIS — N489 Disorder of penis, unspecified: Secondary | ICD-10-CM

## 2019-11-26 DIAGNOSIS — Z6827 Body mass index (BMI) 27.0-27.9, adult: Secondary | ICD-10-CM

## 2019-11-26 DIAGNOSIS — R739 Hyperglycemia, unspecified: Secondary | ICD-10-CM

## 2019-11-26 DIAGNOSIS — Z720 Tobacco use: Secondary | ICD-10-CM

## 2019-11-26 DIAGNOSIS — I10 Essential (primary) hypertension: Secondary | ICD-10-CM

## 2019-11-26 DIAGNOSIS — E785 Hyperlipidemia, unspecified: Secondary | ICD-10-CM

## 2019-11-26 LAB — LIPID PANEL
Cholesterol: 266 mg/dL — ABNORMAL HIGH (ref 0–200)
HDL: 46 mg/dL (ref >40)
LDL Cholesterol: 191 mg/dL — ABNORMAL HIGH (ref 0–99)
Total CHOL/HDL Ratio: 5.8 RATIO
Triglycerides: 145 mg/dL (ref <150)
VLDL: 29 mg/dL (ref 0–40)

## 2019-11-26 LAB — ECHOCARDIOGRAM COMPLETE
AR max vel: 1.59 cm2
AV Area VTI: 1.81 cm2
AV Area mean vel: 1.84 cm2
AV Mean grad: 4 mmHg
AV Peak grad: 7.1 mmHg
Ao pk vel: 1.33 m/s
Height: 72 in
S' Lateral: 2.87 cm
Weight: 3200 oz

## 2019-11-26 LAB — HIV ANTIBODY (ROUTINE TESTING W REFLEX): HIV Screen 4th Generation wRfx: NONREACTIVE

## 2019-11-26 LAB — HEPATITIS B SURFACE ANTIGEN: Hepatitis B Surface Ag: NONREACTIVE

## 2019-11-26 LAB — TROPONIN I (HIGH SENSITIVITY): Troponin I (High Sensitivity): 13 ng/L (ref <18)

## 2019-11-26 LAB — HEMOGLOBIN A1C
Hgb A1c MFr Bld: 5.7 % — ABNORMAL HIGH (ref 4.8–5.6)
Mean Plasma Glucose: 116.89 mg/dL

## 2019-11-26 MED ORDER — ATORVASTATIN CALCIUM 20 MG PO TABS
20.0000 mg | ORAL_TABLET | Freq: Every day | ORAL | Status: DC
  Administered 2019-11-26: 14:00:00 20 mg via ORAL
  Filled 2019-11-26: qty 1

## 2019-11-26 MED ORDER — POLYETHYLENE GLYCOL 3350 17 G PO PACK: 17.0000 g | PACK | Freq: Every evening | ORAL | Status: DC | PRN

## 2019-11-26 MED ORDER — PANTOPRAZOLE SODIUM 40 MG PO TBEC
40.0000 mg | DELAYED_RELEASE_TABLET | Freq: Every day | ORAL | Status: DC
  Administered 2019-11-26 – 2019-11-27 (×2): 40 mg via ORAL
  Filled 2019-11-26 (×2): qty 1

## 2019-11-26 MED ORDER — POLYETHYLENE GLYCOL 3350 17 G PO PACK: 17.0000 g | PACK | Freq: Every day | ORAL | Status: DC | PRN

## 2019-11-26 MED ORDER — ALUM & MAG HYDROXIDE-SIMETH 200-200-20 MG/5ML PO SUSP
30.0000 mL | Freq: Four times a day (QID) | ORAL | Status: DC | PRN
  Administered 2019-11-26: 12:00:00 30 mL via ORAL
  Filled 2019-11-26: qty 30

## 2019-11-26 MED ORDER — LISINOPRIL 20 MG PO TABS
20.0000 mg | ORAL_TABLET | Freq: Every day | ORAL | Status: DC
  Administered 2019-11-26 – 2019-11-27 (×2): 20 mg via ORAL
  Filled 2019-11-26 (×2): qty 1

## 2019-11-26 MED ORDER — AMLODIPINE BESYLATE 10 MG PO TABS
10.0000 mg | ORAL_TABLET | Freq: Every day | ORAL | Status: DC
  Administered 2019-11-26 – 2019-11-27 (×2): 10 mg via ORAL
  Filled 2019-11-26 (×2): qty 1

## 2019-11-26 NOTE — Progress Notes (Signed)
   11/26/19 1135  Assess: MEWS Score  Temp 98 F (36.7 C)  BP (!) 196/98  Pulse Rate (!) 115  Resp 20  Level of Consciousness Alert  SpO2 100 %  O2 Device Room Air  Assess: MEWS Score  MEWS Temp 0  MEWS Systolic 0  MEWS Pulse 2  MEWS RR 0  MEWS LOC 0  MEWS Score 2  MEWS Score Color Yellow  Assess: if the MEWS score is Yellow or Red  Were vital signs taken at a resting state? Yes  Focused Assessment No change from prior assessment  Early Detection of Sepsis Score *See Row Information* Low  MEWS guidelines implemented *See Row Information* Yes  Treat  MEWS Interventions Administered prn meds/treatments  Pain Scale 0-10  Pain Score 0  Take Vital Signs  Increase Vital Sign Frequency  Yellow: Q 2hr X 2 then Q 4hr X 2, if remains yellow, continue Q 4hrs  Escalate  MEWS: Escalate Yellow: discuss with charge nurse/RN and consider discussing with provider and RRT  Notify: Charge Nurse/RN  Name of Charge Nurse/RN Notified Sharlett Lienemann Maisie Fus  Date Charge Nurse/RN Notified 11/26/19  Time Charge Nurse/RN Notified 1135  previously gave PRN IV hydralazine for BP; will give another PRN IV medication for BP

## 2019-11-26 NOTE — Progress Notes (Signed)
PROGRESS NOTE    Logan Crawford  ZOX:[id number] DOB: 1975-10-05 DOA: 11/25/2019 PCP: Oneita Hurt, No    Brief Narrative:  Logan Crawford is a 44 year old male with past medical history notable for essential hypertension, hyperlipidemia, tobacco abuse who presented to the emergency department with progressive weakness over the past 2-3 weeks associated with diarrhea earlier in the course, fatigue, dyspnea and nonproductive cough.  Symptoms progressed up to patient having presyncopal episode.  Patient also reported pressure-like left-sided chest pain radiation to left arm that resolved quickly.  Patient also reports skin lesion to right inner thigh that has been draining, has been present for multiple years.  Also has been referred to a dermatologist for a penile lesion that is painless.  In the ED, temperature 98.0, HR 96, RR 16, BP 132/91, SPO2 100% on room air.  Cardiology was consulted, Dr. Gala Romney who recommended trending troponin with an echocardiogram; and to discharge home if normal.  Urinalysis with small hemoglobinuria and proteinuria.  Covid-19 PCR positive.  LFTs within normal limits.  Troponin XVI, 32.  BNP 27.6.  EKG NSR.  Chest x-ray negative for acute cardiopulmonary disease process.  Hospitalist service consulted for further evaluation and treatment of Covid-19 viral pneumonia, presyncope, chest pain, groin lesion.   Assessment & Plan:   Principal Problem:   Syncopal episodes Active Problems:   Tobacco use   Hyperglycemia   COVID-19 virus infection   Elevated troponin   Abnormal EKG   Hypertension   Hyperlipidemia   Overweight with body mass index (BMI) of 27 to 27.9 in adult   Wheezing   Recurrent cellulitis of thigh   Penile lesion   Atypical chest pain Elevated troponin Patient presenting with chest pain with associated radiation to left arm that has resolved following presentation to ED.  Troponin elevated 16, 32, 24, 13.  EKG with normal sinus rhythm, rate 93, QTc  442 with T wave inversions in lead I, aVL, II, V4-V6; which is changed in comparison to previous EKG dated 06/30/2013.  BNP within normal limits.  Patient with risk factors of tobacco use, hypertension, hyperlipidemia, overweight and CAD in both parents. --TTE pending --Repeat EKG --Check lipid panel --continue to monitor on telemetry  Right groin lesion/cellulitis --Keflex 500 mg p.o. every 6 hours  Penile lesion --HIV/RPR pending --Outpatient follow-up with dermatology  Covid-19 viral infection Covid-19 PCR + 11/25/2019.  Unvaccinated.  Asymptomatic with no signs of respiratory disease.  Was having previous GI discomfort with diarrhea.  This x-ray unrevealing.  Oxygenating well on room air.  Patient received a monoclonal antibody infusion on 11/25/2019.  Recommend receive vaccination in 90 days.  Essential hypertension BP 157/97.  On amlodipine 10 mg p.o. daily at home. --Restart amlodipine 10 mg p.o. daily  Hyperlipidemia --Check lipid panel --Atorvastatin 20 mg p.o. daily  Tobacco use disorder Counseled on need for complete cessation.  Nicotine patch   DVT prophylaxis: Lovenox Code Status: Full code Family Communication: Updated patient extensively at bedside  Disposition Plan:  Status is: Observation  The patient remains OBS appropriate and will d/c before 2 midnights.  Dispo: The patient is from: Home              Anticipated d/c is to: Home              Anticipated d/c date is: 1 day              Patient currently is not medically stable to d/c.     Consultants:  Cardiology, Bensimhon; discussed with the ED provider  Procedures:   TTE: Pending  Antimicrobials:   Keflex 10/1   Subjective: Patient seen and examined at bedside, resting comfortably.  No complaints this morning.  States TTE recently accomplished this morning.  Denies headache, no current chest pain, no palpitations, no shortness of breath, no abdominal pain.  No acute events overnight per  nursing staff.  Objective: Vitals:   11/26/19 0837 11/26/19 1055 11/26/19 1135 11/26/19 1152  BP: (!) 164/112 (!) 156/94 (!) 196/98 (!) 162/87  Pulse: 92  (!) 115 (!) 104  Resp: 18  20 11   Temp: 98.5 F (36.9 C)  98 F (36.7 C)   TempSrc: Oral  Oral   SpO2: 99%  100%   Weight:      Height:       No intake or output data in the 24 hours ending 11/26/19 1154 Filed Weights   11/25/19 1209  Weight: 90.7 kg    Examination:  General exam: Appears calm and comfortable  Respiratory system: Clear to auscultation. Respiratory effort normal.  Oxygenating well on room air Cardiovascular system: S1 & S2 heard, RRR. No JVD, murmurs, rubs, gallops or clicks. No pedal edema. Gastrointestinal system: Abdomen is nondistended, soft and nontender. No organomegaly or masses felt. Normal bowel sounds heard. Central nervous system: Alert and oriented. No focal neurological deficits. Extremities: Symmetric 5 x 5 power. Skin: See pictures below regarding skin lesions Psychiatry: Judgement and insight appear normal. Mood & affect appropriate.         Data Reviewed: I have personally reviewed following labs and imaging studies  CBC: Recent Labs  Lab 11/25/19 1207  WBC 5.6  HGB 16.7  HCT 48.0  MCV 88.2  PLT 263   Basic Metabolic Panel: Recent Labs  Lab 11/25/19 1207  NA 139  K 4.2  CL 107  CO2 22  GLUCOSE 114*  BUN 17  CREATININE 1.09  CALCIUM 9.1   GFR: Estimated Creatinine Clearance: 93.9 mL/min (by C-G formula based on SCr of 1.09 mg/dL). Liver Function Tests: Recent Labs  Lab 11/25/19 1920  AST 20  ALT 18  ALKPHOS 74  BILITOT 0.5  PROT 7.1  ALBUMIN 3.7   No results for input(s): LIPASE, AMYLASE in the last 168 hours. No results for input(s): AMMONIA in the last 168 hours. Coagulation Profile: No results for input(s): INR, PROTIME in the last 168 hours. Cardiac Enzymes: No results for input(s): CKTOTAL, CKMB, CKMBINDEX, TROPONINI in the last 168 hours. BNP  (last 3 results) No results for input(s): PROBNP in the last 8760 hours. HbA1C: No results for input(s): HGBA1C in the last 72 hours. CBG: No results for input(s): GLUCAP in the last 168 hours. Lipid Profile: No results for input(s): CHOL, HDL, LDLCALC, TRIG, CHOLHDL, LDLDIRECT in the last 72 hours. Thyroid Function Tests: No results for input(s): TSH, T4TOTAL, FREET4, T3FREE, THYROIDAB in the last 72 hours. Anemia Panel: No results for input(s): VITAMINB12, FOLATE, FERRITIN, TIBC, IRON, RETICCTPCT in the last 72 hours. Sepsis Labs: No results for input(s): PROCALCITON, LATICACIDVEN in the last 168 hours.  Recent Results (from the past 240 hour(s))  Respiratory Panel by RT PCR (Flu A&B, Covid) - Nasopharyngeal Swab     Status: Abnormal   Collection Time: 11/25/19 12:24 PM   Specimen: Nasopharyngeal Swab  Result Value Ref Range Status   SARS Coronavirus 2 by RT PCR POSITIVE (A) NEGATIVE Final    Comment: RESULT CALLED TO, READ BACK BY AND VERIFIED WITH: ANGELA  ROBBINS AT 1316 ON 11/25/2019 [facility]. (NOTE) SARS-CoV-2 target nucleic acids are DETECTED.  SARS-CoV-2 RNA is generally detectable in upper respiratory specimens  during the acute phase of infection. Positive results are indicative of the presence of the identified virus, but do not rule out bacterial infection or co-infection with other pathogens not detected by the test. Clinical correlation with patient history and other diagnostic information is necessary to determine patient infection status. The expected result is Negative.  Fact Sheet for Patients:  https://www.moore.com/  Fact Sheet for Healthcare Providers: https://www.young.biz/  This test is not yet approved or cleared by the [place] FDA and  has been authorized for detection and/or diagnosis of SARS-CoV-2 by FDA under an Emergency Use Authorization (EUA).  This EUA will remain in effect (meaning this test c an be  used) for the duration of  the COVID-19 declaration under Section 564(b)(1) of the Act, 21 U.S.C. section 360bbb-3(b)(1), unless the authorization is terminated or revoked sooner.      Influenza A by PCR NEGATIVE NEGATIVE Final   Influenza B by PCR NEGATIVE NEGATIVE Final    Comment: (NOTE) The Xpert Xpress SARS-CoV-2/FLU/RSV assay is intended as an aid in  the diagnosis of influenza from Nasopharyngeal swab specimens and  should not be used as a sole basis for treatment. Nasal washings and  aspirates are unacceptable for Xpert Xpress SARS-CoV-2/FLU/RSV  testing.  Fact Sheet for Patients: https://www.moore.com/  Fact Sheet for Healthcare Providers: https://www.young.biz/  This test is not yet approved or cleared by the [place] FDA and  has been authorized for detection and/or diagnosis of SARS-CoV-2 by  FDA under an Emergency Use Authorization (EUA). This EUA will remain  in effect (meaning this test can be used) for the duration of the  Covid-19 declaration under Section 564(b)(1) of the Act, 21  U.S.C. section 360bbb-3(b)(1), unless the authorization is  terminated or revoked. Performed at [facility], [address], [place], [place] [address]          Radiology Studies: DG Chest 1 View  Result Date: 11/25/2019 CLINICAL DATA:  COVID LOC EXAM: CHEST  1 VIEW COMPARISON:  None. FINDINGS: The heart size and mediastinal contours are within normal limits. Both lungs are clear. The visualized skeletal structures are unremarkable. IMPRESSION: No active disease. Electronically Signed   By: Jasmine Pang M.D.   On: 11/25/2019 15:47        Scheduled Meds: . cephALEXin  500 mg Oral Q6H WA  . enoxaparin (LOVENOX) injection  40 mg Subcutaneous Q24H  . Ipratropium-Albuterol  2 puff Inhalation Q6H  . melatonin  5 mg Oral QHS  . sodium chloride flush  3 mL Intravenous Q12H   Continuous Infusions: . sodium chloride    .  famotidine (PEPCID) IV       LOS: 0 days    Time spent: 38 minutes spent on chart review, discussion with nursing staff, consultants, updating family and interview/physical exam; more than 50% of that time was spent in counseling and/or coordination of care.    Alvira Philips [place], DO [facility] Available via Epic secure chat 7am-7pm After these hours, please refer to coverage provider listed on [email or web address] 11/26/2019, 11:54 AM

## 2019-11-26 NOTE — Progress Notes (Signed)
Dr [place] ordered in [facility] Norvasc for pt, EKG once and Lipitor due to lab results for add ons with cholesterol and LDL

## 2019-11-26 NOTE — Progress Notes (Signed)
   11/26/19 1147  Treat  Complains of Other (Comment) (blood pressure)  Interventions Medication (see MAR)  gave PRN IV labetalol for BP

## 2019-11-26 NOTE — Progress Notes (Signed)
   11/26/19 1145  Treat  Complains of Heartburn  Interventions Medication (see MAR)  gave PRN PO Maalox for pt c/o heartburn

## 2019-11-27 DIAGNOSIS — R062 Wheezing: Secondary | ICD-10-CM

## 2019-11-27 DIAGNOSIS — U071 COVID-19: Secondary | ICD-10-CM | POA: Diagnosis not present

## 2019-11-27 DIAGNOSIS — R55 Syncope and collapse: Secondary | ICD-10-CM | POA: Diagnosis not present

## 2019-11-27 DIAGNOSIS — R9431 Abnormal electrocardiogram [ECG] [EKG]: Secondary | ICD-10-CM | POA: Diagnosis not present

## 2019-11-27 DIAGNOSIS — R778 Other specified abnormalities of plasma proteins: Secondary | ICD-10-CM | POA: Diagnosis not present

## 2019-11-27 LAB — RPR: RPR Ser Ql: NONREACTIVE

## 2019-11-27 LAB — HEPATITIS B SURFACE ANTIBODY, QUANTITATIVE: Hep B S AB Quant (Post): <3.1 m[IU]/mL — ABNORMAL LOW (ref >9.9)

## 2019-11-27 MED ORDER — ALBUTEROL SULFATE HFA 108 (90 BASE) MCG/ACT IN AERS: 2.0000 | INHALATION_SPRAY | Freq: Four times a day (QID) | RESPIRATORY_TRACT | 0 refills | Status: DC | PRN

## 2019-11-27 MED ORDER — CEPHALEXIN 500 MG PO CAPS: 500.0000 mg | ORAL_CAPSULE | Freq: Four times a day (QID) | ORAL | 0 refills | Status: AC

## 2019-11-27 MED ORDER — ATORVASTATIN CALCIUM 40 MG PO TABS: 40.0000 mg | ORAL_TABLET | Freq: Every day | ORAL | 0 refills | Status: DC

## 2019-11-27 MED ORDER — AMLODIPINE BESYLATE 10 MG PO TABS: 10.0000 mg | ORAL_TABLET | Freq: Every day | ORAL | 0 refills | Status: DC

## 2019-11-27 MED ORDER — LISINOPRIL 20 MG PO TABS: 20.0000 mg | ORAL_TABLET | Freq: Every day | ORAL | 0 refills | Status: DC

## 2019-11-27 MED ORDER — PANTOPRAZOLE SODIUM 40 MG PO TBEC: 40.0000 mg | DELAYED_RELEASE_TABLET | Freq: Every day | ORAL | 0 refills | Status: DC

## 2019-11-27 MED ORDER — ATORVASTATIN CALCIUM 20 MG PO TABS
40.0000 mg | ORAL_TABLET | Freq: Every day | ORAL | Status: DC
  Administered 2019-11-27: 40 mg via ORAL
  Filled 2019-11-27: qty 2

## 2019-11-27 NOTE — Evaluation (Signed)
Physical Therapy Evaluation Patient Details Name: Logan Crawford MRN: [id number] DOB: [birth year] Today's Date: 11/27/2019   History of Present Illness  Patient is a 44 year old male with past medical history notable for essential hypertension, hyperlipidemia, tobacco abuse who presented to the emergency department with progressive weakness over the past 2-3 weeks associated with diarrhea earlier in the course, fatigue, dyspnea and nonproductive cough.  Symptoms progressed up to patient having presyncopal episode.  Patient also reported pressure-like left-sided chest pain radiation to left arm that resolved quickly.  Patient also reports skin lesion to right inner thigh that has been draining, has been present for multiple years.  Also has been referred to a dermatologist for a penile lesion that is painless. COVID-19 virus infection with monoclonal antibody infusion on 11/25/2019  Clinical Impression  PT evaluation completed. Patient reports no pain or syncopal episodes recently. Patient agreeable to progress activity as able with physical therapy as patient reports he has not ambulated outside the room. Patient is independent with bed mobility and sit to stand transfer from bed. Patient ambulated around the nursing station on room air without loss of balance and with no assistive device. Occasional cues for safety, to decrease cadence, and for energy conservation techniques. Patient reports mild dizziness after walking with Sp02 89% on room and and heart rate 122bpm. Sp02 increased into the low 90's within a minute of seated rest break and dizziness improved. Patient is anticipated to discharge later today. No further acute PT needs at this time.      Follow Up Recommendations No PT follow up    Equipment Recommendations  None recommended by PT    Recommendations for Other Services       Precautions / Restrictions Precautions Precautions: Fall Restrictions Weight Bearing Restrictions: No       Mobility  Bed Mobility Overal bed mobility: Independent                Transfers Overall transfer level: Independent               General transfer comment: for sit to stand transfers to and from bed   Ambulation/Gait Ambulation/Gait assistance: Modified independent (Device/Increase time) Gait Distance (Feet): 360 Feet Assistive device: None Gait Pattern/deviations: WFL(Within Functional Limits)     General Gait Details: Patient ambulated around nursing unit and in the room without loss of balance with no assistive device. Sp02 89% after walking with heart rate 122bpm. Patient does report mild dizziness after ambulating that subsided with seated rest break. Educated patient to slowly progress activity as tolerated, importance of deep breathing, and taking rest breaks as needed with daily tasks for endurance management at discharge. Cues for safety with ambulation provided   Stairs            Wheelchair Mobility    Modified Rankin (Stroke Patients Only)       Balance Overall balance assessment: Needs assistance Sitting-balance support: Feet unsupported Sitting balance-Leahy Scale: Normal     Standing balance support: No upper extremity supported;During functional activity Standing balance-Leahy Scale: Normal Standing balance comment: patient is able to reach outside base of support without UE support and maintian balance without difficulty with functional ambulation and dynamic standing tasks.                              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       Higher education careers adviser        Extremity/Trunk Assessment   Upper Extremity Assessment Upper Extremity Assessment: Overall WFL for tasks assessed    Lower Extremity Assessment Lower Extremity Assessment: Overall WFL for tasks assessed       Communication   Communication: No difficulties  Cognition Arousal/Alertness: Awake/alert Behavior During Therapy: WFL for tasks assessed/performed Overall Cognitive Status: Within Functional Limits for tasks assessed                                        General Comments      Exercises     Assessment/Plan    PT Assessment Patent does not need any further PT services  PT Problem List         PT Treatment Interventions      PT Goals (Current goals can be found in the Care Plan section)  Acute Rehab PT Goals Patient Stated Goal: to go home  PT Goal Formulation: With patient Time For Goal Achievement: 11/27/19 Potential to Achieve Goals: Good    Frequency     Barriers to discharge        Co-evaluation               AM-PAC PT "6 Clicks" Mobility  Outcome Measure Help needed turning from your back to your side while in a flat bed without using bedrails?: None Help needed moving from lying on your back to sitting on the side of a flat bed without using bedrails?: None Help needed moving to and from a bed to a chair (including a wheelchair)?: None Help needed standing up from a chair using your arms (e.g., wheelchair or bedside chair)?: None Help needed to walk in hospital room?: None Help needed climbing 3-5 steps with a railing? : None 6 Click Score: 24    End of Session   Activity Tolerance: Patient tolerated treatment well Patient left: in bed;with call bell/phone within reach Nurse Communication:  (white board up to date with mobility status ) PT Visit Diagnosis: Muscle weakness (generalized) (M62.81)    Time: 0240-9735 PT Time Calculation (min) (ACUTE ONLY): 25 min   Charges:   PT  Evaluation $PT Eval Moderate Complexity: 1 Mod PT Treatments $Gait Training: 8-22 mins        Donna Bernard, PT, MPT  [employer] [employer] 11/27/2019, 11:06 AM

## 2019-11-27 NOTE — Plan of Care (Signed)
  Problem: Education: Goal: Knowledge of risk factors and measures for prevention of condition will improve Outcome: Progressing   

## 2019-11-27 NOTE — Plan of Care (Signed)

## 2019-11-27 NOTE — Progress Notes (Signed)
MD order received in [facility] to discharge pt home today; verbally reviewed AVS with pt, Rxs escribed to [facility] on [address] in [place], [place]; no questions voiced at this time; pt's discharge pending arrival of orderly for transport from 1C floor to the ED parking lot to his personal car

## 2019-11-27 NOTE — Discharge Instructions (Signed)
10 Things You Can Do to Manage Your COVID-19 Symptoms at Home If you have possible or confirmed COVID-19: 1. Stay home from work and school. And stay away from other public places. If you must go out, avoid using any kind of public transportation, ridesharing, or taxis. 2. Monitor your symptoms carefully. If your symptoms get worse, call your healthcare provider immediately. 3. Get rest and stay hydrated. 4. If you have a medical appointment, call the healthcare provider ahead of time and tell them that you have or may have COVID-19. 5. For medical emergencies, call 911 and notify the dispatch personnel that you have or may have COVID-19. 6. Cover your cough and sneezes with a tissue or use the inside of your elbow. 7. Wash your hands often with soap and water for at least 20 seconds or clean your hands with an alcohol-based hand sanitizer that contains at least 60% alcohol. 8. As much as possible, stay in a specific room and away from other people in your home. Also, you should use a separate bathroom, if available. If you need to be around other people in or outside of the home, wear a mask. 9. Avoid sharing personal items with other people in your household, like dishes, towels, and bedding. 10. Clean all surfaces that are touched often, like counters, tabletops, and doorknobs. Use household cleaning sprays or wipes according to the label instructions. cdc.gov/coronavirus 08/25/2018 This information is not intended to replace advice given to you by your health care provider. Make sure you discuss any questions you have with your health care provider. Document Revised: 01/27/2019 Document Reviewed: 01/27/2019 Elsevier Patient Education  2020 Elsevier Inc.  COVID-19: How to Protect Yourself and Others Know how it spreads  There is currently no vaccine to prevent coronavirus disease 2019 (COVID-19).  The best way to prevent illness is to avoid being exposed to this virus.  The virus is  thought to spread mainly from person-to-person. ? Between people who are in close contact with one another (within about 6 feet). ? Through respiratory droplets produced when an infected person coughs, sneezes or talks. ? These droplets can land in the mouths or noses of people who are nearby or possibly be inhaled into the lungs. ? COVID-19 may be spread by people who are not showing symptoms. Everyone should Clean your hands often  Wash your hands often with soap and water for at least 20 seconds especially after you have been in a public place, or after blowing your nose, coughing, or sneezing.  If soap and water are not readily available, use a hand sanitizer that contains at least 60% alcohol. Cover all surfaces of your hands and rub them together until they feel dry.  Avoid touching your eyes, nose, and mouth with unwashed hands. Avoid close contact  Limit contact with others as much as possible.  Avoid close contact with people who are sick.  Put distance between yourself and other people. ? Remember that some people without symptoms may be able to spread virus. ? This is especially important for people who are at higher risk of getting very sick.www.cdc.gov/coronavirus/2019-ncov/need-extra-precautions/people-at-higher-risk.html Cover your mouth and nose with a mask when around others  You could spread COVID-19 to others even if you do not feel sick.  Everyone should wear a mask in public settings and when around people not living in their household, especially when social distancing is difficult to maintain. ? Masks should not be placed on young children under age 2, anyone who   has trouble breathing, or is unconscious, incapacitated or otherwise unable to remove the mask without assistance.  The mask is meant to protect other people in case you are infected.  Do NOT use a facemask meant for a Research scientist (physical sciences).  Continue to keep about 6 feet between yourself and others. The  mask is not a substitute for social distancing. Cover coughs and sneezes  Always cover your mouth and nose with a tissue when you cough or sneeze or use the inside of your elbow.  Throw used tissues in the trash.  Immediately wash your hands with soap and water for at least 20 seconds. If soap and water are not readily available, clean your hands with a hand sanitizer that contains at least 60% alcohol. Clean and disinfect  Clean AND disinfect frequently touched surfaces daily. This includes tables, doorknobs, light switches, countertops, handles, desks, phones, keyboards, toilets, faucets, and sinks. [email or web address]  If surfaces are dirty, clean them: Use detergent or soap and water prior to disinfection.  Then, use a household disinfectant. You can see a list of EPA-registered household disinfectants here. [email or web address] 10/27/2018 This information is not intended to replace advice given to you by your health care provider. Make sure you discuss any questions you have with your health care provider. Document Revised: 11/04/2018 Document Reviewed: 09/02/2018 Elsevier Patient Education  2020 Elsevier Inc.  Hypertension, Adult High blood pressure (hypertension) is when the force of blood pumping through the arteries is too strong. The arteries are the blood vessels that carry blood from the heart throughout the body. Hypertension forces the heart to work harder to pump blood and may cause arteries to become narrow or stiff. Untreated or uncontrolled hypertension can cause a heart attack, heart failure, a stroke, kidney disease, and other problems. A blood pressure reading consists of a higher number over a lower number. Ideally, your blood pressure should be below 120/80. The first ("top") number is called the systolic pressure. It is a measure of the pressure in your arteries as your heart beats. The second ("bottom")  number is called the diastolic pressure. It is a measure of the pressure in your arteries as the heart relaxes. What are the causes? The exact cause of this condition is not known. There are some conditions that result in or are related to high blood pressure. What increases the risk? Some risk factors for high blood pressure are under your control. The following factors may make you more likely to develop this condition:  Smoking.  Having type 2 diabetes mellitus, high cholesterol, or both.  Not getting enough exercise or physical activity.  Being overweight.  Having too much fat, sugar, calories, or salt (sodium) in your diet.  Drinking too much alcohol. Some risk factors for high blood pressure may be difficult or impossible to change. Some of these factors include:  Having chronic kidney disease.  Having a family history of high blood pressure.  Age. Risk increases with age.  Race. You may be at higher risk if you are African American.  Gender. Men are at higher risk than women before age 83. After age 26, women are at higher risk than men.  Having obstructive sleep apnea.  Stress. What are the signs or symptoms? High blood pressure may not cause symptoms. Very high blood pressure (hypertensive crisis) may cause:  Headache.  Anxiety.  Shortness of breath.  Nosebleed.  Nausea and vomiting.  Vision changes.  Severe chest pain.  Seizures. How is this diagnosed? This condition is  diagnosed by measuring your blood pressure while you are seated, with your arm resting on a flat surface, your legs uncrossed, and your feet flat on the floor. The cuff of the blood pressure monitor will be placed directly against the skin of your upper arm at the level of your heart. It should be measured at least twice using the same arm. Certain conditions can cause a difference in blood pressure between your right and left arms. Certain factors can cause blood pressure readings to be  lower or higher than normal for a short period of time:  When your blood pressure is higher when you are in a health care provider's office than when you are at home, this is called white coat hypertension. Most people with this condition do not need medicines.  When your blood pressure is higher at home than when you are in a health care provider's office, this is called masked hypertension. Most people with this condition may need medicines to control blood pressure. If you have a high blood pressure reading during one visit or you have normal blood pressure with other risk factors, you may be asked to:  Return on a different day to have your blood pressure checked again.  Monitor your blood pressure at home for 1 week or longer. If you are diagnosed with hypertension, you may have other blood or imaging tests to help your health care provider understand your overall risk for other conditions. How is this treated? This condition is treated by making healthy lifestyle changes, such as eating healthy foods, exercising more, and reducing your alcohol intake. Your health care provider may prescribe medicine if lifestyle changes are not enough to get your blood pressure under control, and if:  Your systolic blood pressure is above 130.  Your diastolic blood pressure is above 80. Your personal target blood pressure may vary depending on your medical conditions, your age, and other factors. Follow these instructions at home: Eating and drinking   Eat a diet that is high in fiber and potassium, and low in sodium, added sugar, and fat. An example eating plan is called the DASH (Dietary Approaches to Stop Hypertension) diet. To eat this way: ? Eat plenty of fresh fruits and vegetables. Try to fill one half of your plate at each meal with fruits and vegetables. ? Eat whole grains, such as whole-wheat pasta, brown rice, or whole-grain bread. Fill about one fourth of your plate with whole grains. ? Eat  or drink low-fat dairy products, such as skim milk or low-fat yogurt. ? Avoid fatty cuts of meat, processed or cured meats, and poultry with skin. Fill about one fourth of your plate with lean proteins, such as fish, chicken without skin, beans, eggs, or tofu. ? Avoid pre-made and processed foods. These tend to be higher in sodium, added sugar, and fat.  Reduce your daily sodium intake. Most people with hypertension should eat less than 1,500 mg of sodium a day.  Do not drink alcohol if: ? Your health care provider tells you not to drink. ? You are pregnant, may be pregnant, or are planning to become pregnant.  If you drink alcohol: ? Limit how much you use to:  0-1 drink a day for women.  0-2 drinks a day for men. ? Be aware of how much alcohol is in your drink. In the [place] one drink equals one 12 oz bottle of beer (355 mL), one 5 oz glass of wine (148 mL), or one 1 oz  glass of hard liquor (44 mL). Lifestyle   Work with your health care provider to maintain a healthy body weight or to lose weight. Ask what an ideal weight is for you.  Get at least 30 minutes of exercise most days of the week. Activities may include walking, swimming, or biking.  Include exercise to strengthen your muscles (resistance exercise), such as Pilates or lifting weights, as part of your weekly exercise routine. Try to do these types of exercises for 30 minutes at least 3 days a week.  Do not use any products that contain nicotine or tobacco, such as cigarettes, e-cigarettes, and chewing tobacco. If you need help quitting, ask your health care provider.  Monitor your blood pressure at home as told by your health care provider.  Keep all follow-up visits as told by your health care provider. This is important. Medicines  Take over-the-counter and prescription medicines only as told by your health care provider. Follow directions carefully. Blood pressure medicines must be taken as prescribed.  Do not skip  doses of blood pressure medicine. Doing this puts you at risk for problems and can make the medicine less effective.  Ask your health care provider about side effects or reactions to medicines that you should watch for. Contact a health care provider if you:  Think you are having a reaction to a medicine you are taking.  Have headaches that keep coming back (recurring).  Feel dizzy.  Have swelling in your ankles.  Have trouble with your vision. Get help right away if you:  Develop a severe headache or confusion.  Have unusual weakness or numbness.  Feel faint.  Have severe pain in your chest or abdomen.  Vomit repeatedly.  Have trouble breathing. Summary  Hypertension is when the force of blood pumping through your arteries is too strong. If this condition is not controlled, it may put you at risk for serious complications.  Your personal target blood pressure may vary depending on your medical conditions, your age, and other factors. For most people, a normal blood pressure is less than 120/80.  Hypertension is treated with lifestyle changes, medicines, or a combination of both. Lifestyle changes include losing weight, eating a healthy, low-sodium diet, exercising more, and limiting alcohol. This information is not intended to replace advice given to you by your health care provider. Make sure you discuss any questions you have with your health care provider. Document Revised: 10/21/2017 Document Reviewed: 10/21/2017 Elsevier Patient Education  2020 Elsevier Inc.    Person Under Monitoring Name: Logan Crawford  Location: [address] [place] [address]   Infection Prevention Recommendations for Individuals Confirmed to have, or Being Evaluated for, 2019 Novel Coronavirus (COVID-19) Infection Who Receive Care at Home  Individuals who are confirmed to have, or are being evaluated for, COVID-19 should follow the prevention steps below until a healthcare  provider or local or state health department says they can return to normal activities.  Stay home except to get medical care You should restrict activities outside your home, except for getting medical care. Do not go to work, school, or public areas, and do not use public transportation or taxis.  Call ahead before visiting your doctor Before your medical appointment, call the healthcare provider and tell them that you have, or are being evaluated for, COVID-19 infection. This will help the healthcare provider's office take steps to keep other people from getting infected. Ask your healthcare provider to call the local or  state health department.  Monitor your symptoms Seek prompt medical attention if your illness is worsening (e.g., difficulty breathing). Before going to your medical appointment, call the healthcare provider and tell them that you have, or are being evaluated for, COVID-19 infection. Ask your healthcare provider to call the local or state health department.  Wear a facemask You should wear a facemask that covers your nose and mouth when you are in the same room with other people and when you visit a healthcare provider. People who live with or visit you should also wear a facemask while they are in the same room with you.  Separate yourself from other people in your home As much as possible, you should stay in a different room from other people in your home. Also, you should use a separate bathroom, if available.  Avoid sharing household items You should not share dishes, drinking glasses, cups, eating utensils, towels, bedding, or other items with other people in your home. After using these items, you should wash them thoroughly with soap and water.  Cover your coughs and sneezes Cover your mouth and nose with a tissue when you cough or sneeze, or you can cough or sneeze into your sleeve. Throw used tissues in a lined trash can, and immediately wash your hands  with soap and water for at least 20 seconds or use an alcohol-based hand rub.  Wash your [employer] your hands often and thoroughly with soap and water for at least 20 seconds. You can use an alcohol-based hand sanitizer if soap and water are not available and if your hands are not visibly dirty. Avoid touching your eyes, nose, and mouth with unwashed hands.   Prevention Steps for Caregivers and Household Members of Individuals Confirmed to have, or Being Evaluated for, COVID-19 Infection Being Cared for in the Home  If you live with, or provide care at home for, a person confirmed to have, or being evaluated for, COVID-19 infection please follow these guidelines to prevent infection:  Follow healthcare provider's instructions Make sure that you understand and can help the patient follow any healthcare provider instructions for all care.  Provide for the patient's basic needs You should help the patient with basic needs in the home and provide support for getting groceries, prescriptions, and other personal needs.  Monitor the patient's symptoms If they are getting sicker, call his or her medical provider and tell them that the patient has, or is being evaluated for, COVID-19 infection. This will help the healthcare provider's office take steps to keep other people from getting infected. Ask the healthcare provider to call the local or state health department.  Limit the number of people who have contact with the patient  If possible, have only one caregiver for the patient.  Other household members should stay in another home or place of residence. If this is not possible, they should stay  in another room, or be separated from the patient as much as possible. Use a separate bathroom, if available.  Restrict visitors who do not have an essential need to be in the home.  Keep older adults, very young children, and other sick people away from the patient Keep older adults, very  young children, and those who have compromised immune systems or chronic health conditions away from the patient. This includes people with chronic heart, lung, or kidney conditions, diabetes, and cancer.  Ensure good ventilation Make sure that shared spaces in the home have good air flow, such as  from an air conditioner or an opened window, weather permitting.  Wash your hands often  Wash your hands often and thoroughly with soap and water for at least 20 seconds. You can use an alcohol based hand sanitizer if soap and water are not available and if your hands are not visibly dirty.  Avoid touching your eyes, nose, and mouth with unwashed hands.  Use disposable paper towels to dry your hands. If not available, use dedicated cloth towels and replace them when they become wet.  Wear a facemask and gloves  Wear a disposable facemask at all times in the room and gloves when you touch or have contact with the patient's blood, body fluids, and/or secretions or excretions, such as sweat, saliva, sputum, nasal mucus, vomit, urine, or feces.  Ensure the mask fits over your nose and mouth tightly, and do not touch it during use.  Throw out disposable facemasks and gloves after using them. Do not reuse.  Wash your hands immediately after removing your facemask and gloves.  If your personal clothing becomes contaminated, carefully remove clothing and launder. Wash your hands after handling contaminated clothing.  Place all used disposable facemasks, gloves, and other waste in a lined container before disposing them with other household waste.  Remove gloves and wash your hands immediately after handling these items.  Do not share dishes, glasses, or other household items with the patient  Avoid sharing household items. You should not share dishes, drinking glasses, cups, eating utensils, towels, bedding, or other items with a patient who is confirmed to have, or being evaluated for, COVID-19  infection.  After the person uses these items, you should wash them thoroughly with soap and water.  Wash laundry thoroughly  Immediately remove and wash clothes or bedding that have blood, body fluids, and/or secretions or excretions, such as sweat, saliva, sputum, nasal mucus, vomit, urine, or feces, on them.  Wear gloves when handling laundry from the patient.  Read and follow directions on labels of laundry or clothing items and detergent. In general, wash and dry with the warmest temperatures recommended on the label.  Clean all areas the individual has used often  Clean all touchable surfaces, such as counters, tabletops, doorknobs, bathroom fixtures, toilets, phones, keyboards, tablets, and bedside tables, every day. Also, clean any surfaces that may have blood, body fluids, and/or secretions or excretions on them.  Wear gloves when cleaning surfaces the patient has come in contact with.  Use a diluted bleach solution (e.g., dilute bleach with 1 part bleach and 10 parts water) or a household disinfectant with a label that says EPA-registered for coronaviruses. To make a bleach solution at home, add 1 tablespoon of bleach to 1 quart (4 cups) of water. For a larger supply, add  cup of bleach to 1 gallon (16 cups) of water.  Read labels of cleaning products and follow recommendations provided on product labels. Labels contain instructions for safe and effective use of the cleaning product including precautions you should take when applying the product, such as wearing gloves or eye protection and making sure you have good ventilation during use of the product.  Remove gloves and wash hands immediately after cleaning.  Monitor yourself for signs and symptoms of illness Caregivers and household members are considered close contacts, should monitor their health, and will be asked to limit movement outside of the home to the extent possible. Follow the monitoring steps for close contacts  listed on the symptom monitoring form.   ?  If you have additional questions, contact your local health department or call the epidemiologist on call at [phone] (available 24/7). ? This guidance is subject to change. For the most up-to-date guidance from [facility], please refer to their website: [email or web address]

## 2019-11-27 NOTE — Discharge Summary (Signed)
Physician Discharge Summary  Logan Crawford UUV:[id number] DOB: 08-Sep-1975 DOA: 11/25/2019  PCP: Pcp, No  Admit date: 11/25/2019 Discharge date: 11/27/2019  Admitted From: Home Disposition: Home  Recommendations for Outpatient Follow-up:  1. Follow up with PCP in 1-2 weeks 2. Started on amlodipine and lisinopril for poorly controlled hypertension 3. Increase Lipitor to 40 mg daily for elevated cholesterol panel 4. Referred to outpatient cardiology for abnormal EKG 5. Continue antibiotics with Keflex 500 mg every 6 hours to complete 10-day course for cellulitis 6. Continue to encourage tobacco cessation 7. Recommend Covid-19 vaccination 90 days following monoclonal antibody infusion  Home Health: No Equipment/Devices: None.  Discharge Condition: Stable CODE STATUS: Full code Diet recommendation: Heart healthy diet  History of present illness:  Logan Crawford is a 44 year old male with past medical history notable for essential hypertension, hyperlipidemia, tobacco abuse who presented to the emergency department with progressive weakness over the past 2-3 weeks associated with diarrhea earlier in the course, fatigue, dyspnea and nonproductive cough.  Symptoms progressed up to patient having presyncopal episode.  Patient also reported pressure-like left-sided chest pain radiation to left arm that resolved quickly.  Patient also reports skin lesion to right inner thigh that has been draining, has been present for multiple years.  Also has been referred to a dermatologist for a penile lesion that is painless.  In the ED, temperature 98.0, HR 96, RR 16, BP 132/91, SPO2 100% on room air.  Cardiology was consulted, Dr. Gala Romney who recommended trending troponin with an echocardiogram; and to discharge home if normal.  Urinalysis with small hemoglobinuria and proteinuria.  Covid-19 PCR positive.  LFTs within normal limits.  Troponin XVI, 32.  BNP 27.6.  EKG NSR.  Chest x-ray negative for  acute cardiopulmonary disease process.  Hospitalist service consulted for further evaluation and treatment of Covid-19 viral pneumonia, presyncope, chest pain, groin lesion.  Hospital course:  Atypical chest pain Elevated troponin Patient presenting with chest pain with associated radiation to left arm that has resolved following presentation to ED.  Troponin elevated 16, 32, 24, 13.  EKG with normal sinus rhythm, rate 93, QTc 442 with T wave inversions in lead I, aVL, II, V4-V6; which is changed in comparison to previous EKG dated 06/30/2013.  BNP within normal limits.  Patient with risk factors of tobacco use, hypertension, hyperlipidemia, overweight and CAD in both parents.  TTE with LVEF 50-55% with grade 1 diastolic dysfunction.  Patient referred to outpatient cardiology for further evaluation and follow-up.  Right groin lesion/cellulitis Keflex 500 mg p.o. every 6 hours to complete 10-day course.  May need outpatient follow-up with general surgery versus dermatology if lesion remains for consideration of excisional biopsy for possible underlying cyst.  Penile lesion HIV negative, RPR pending at time of discharge.  Outpatient follow-up with PCP/dermatology.  Covid-19 viral infection Covid-19 PCR + 11/25/2019.  Unvaccinated.  Asymptomatic with no signs of respiratory disease.  Was having previous GI discomfort with diarrhea.  This x-ray unrevealing.  Oxygenating well on room air.  Patient received a monoclonal antibody infusion on 11/25/2019.  Recommend receive vaccination in 90 days.  Essential hypertension Restarted home amlodipine 10 mg p.o. daily and started on lisinopril 20 mg p.o. daily with improvement of his hypertension.  BP 130/90 at time of discharge.  Recommend patient monitor his blood pressure and maintain log at home.  Will need further follow-up and guidance with PCP/cardiology.  Hyperlipidemia Lipid panel with total cholesterol 266, HDL 46, LDL 191, triglycerides 145.  Increase Lipitor to 40 mg p.o. daily.  Tobacco use disorder Counseled on need for complete cessation.    Discharge Diagnoses:  Active Problems:   Tobacco use   COVID-19 virus infection   Abnormal EKG   Hypertension   Hyperlipidemia   Overweight with body mass index (BMI) of 27 to 27.9 in adult   Recurrent cellulitis of thigh   Penile lesion    Discharge Instructions  Discharge Instructions    Ambulatory referral to Cardiology   Complete by: As directed    Call MD for:  difficulty breathing, headache or visual disturbances   Complete by: As directed    Call MD for:  extreme fatigue   Complete by: As directed    Call MD for:  persistant dizziness or light-headedness   Complete by: As directed    Call MD for:  persistant nausea and vomiting   Complete by: As directed    Call MD for:  severe uncontrolled pain   Complete by: As directed    Call MD for:  temperature >100.4   Complete by: As directed    Diet - low sodium heart healthy   Complete by: As directed    Increase activity slowly   Complete by: As directed      Allergies as of 11/27/2019   No Known Allergies     Medication List    TAKE these medications   albuterol 108 (90 Base) MCG/ACT inhaler Commonly known as: VENTOLIN HFA Inhale 2 puffs into the lungs every 6 (six) hours as needed for wheezing or shortness of breath.   amLODipine 10 MG tablet Commonly known as: NORVASC Take 1 tablet (10 mg total) by mouth daily.   atorvastatin 40 MG tablet Commonly known as: LIPITOR Take 1 tablet (40 mg total) by mouth daily. What changed:   medication strength  how much to take   cephALEXin 500 MG capsule Commonly known as: KEFLEX Take 1 capsule (500 mg total) by mouth every 6 (six) hours for 9 days.   lisinopril 20 MG tablet Commonly known as: ZESTRIL Take 1 tablet (20 mg total) by mouth daily.   pantoprazole 40 MG tablet Commonly known as: PROTONIX Take 1 tablet (40 mg total) by mouth daily.     sertraline 50 MG tablet Commonly known as: ZOLOFT Take 50 mg by mouth daily.       No Known Allergies  Consultations:  Cardiology, Dr. Gala Romney; ED physician discussed case   Procedures/Studies: DG Chest 1 View  Result Date: 11/25/2019 CLINICAL DATA:  COVID LOC EXAM: CHEST  1 VIEW COMPARISON:  None. FINDINGS: The heart size and mediastinal contours are within normal limits. Both lungs are clear. The visualized skeletal structures are unremarkable. IMPRESSION: No active disease. Electronically Signed   By: Jasmine Pang M.D.   On: 11/25/2019 15:47   ECHOCARDIOGRAM COMPLETE  Result Date: 11/26/2019    ECHOCARDIOGRAM REPORT   Patient Name:   Logan Crawford Date of Exam: 11/26/2019 Medical Rec #:  [id number]        Height:       72.0 in Accession #:    [id number]       Weight:       200.0 lb Date of Birth:  July 31, 1975       BSA:          2.131 m Patient Age:    43 years         BP:  157/97 mmHg Patient Gender: M                HR:           92 bpm. Exam Location:  [facility] Procedure: 2D Echo Indications:     Elevated Troponin  History:         Patient has no prior history of Echocardiogram examinations.                  Risk Factors:Current Smoker, Uncontrolled HTN and Dyslipidemia.  Sonographer:     L Thornton-Maynard Referring Phys:  [id number] DAVID MANUEL ORTIZ Diagnosing Phys: Alwyn Pea MD IMPRESSIONS  1. Left ventricular ejection fraction, by estimation, is 50 to 55%. The left ventricle has low normal function. The left ventricle has no regional wall motion abnormalities. Left ventricular diastolic parameters are consistent with Grade I diastolic dysfunction (impaired relaxation).  2. Right ventricular systolic function is normal. The right ventricular size is normal.  3. The mitral valve is normal in structure. No evidence of mitral valve regurgitation.  4. The aortic valve is calcified. Aortic valve regurgitation is not visualized. Mild aortic valve sclerosis is present, with  no evidence of aortic valve stenosis. FINDINGS  Left Ventricle: Left ventricular ejection fraction, by estimation, is 50 to 55%. The left ventricle has low normal function. The left ventricle has no regional wall motion abnormalities. The left ventricular internal cavity size was normal in size. There is no left ventricular hypertrophy. Left ventricular diastolic parameters are consistent with Grade I diastolic dysfunction (impaired relaxation). Right Ventricle: The right ventricular size is normal. No increase in right ventricular wall thickness. Right ventricular systolic function is normal. Left Atrium: Left atrial size was normal in size. Right Atrium: Right atrial size was normal in size. Pericardium: There is no evidence of pericardial effusion. Mitral Valve: The mitral valve is normal in structure. No evidence of mitral valve regurgitation. Tricuspid Valve: The tricuspid valve is normal in structure. Tricuspid valve regurgitation is trivial. Aortic Valve: The aortic valve is calcified. Aortic valve regurgitation is not visualized. Mild aortic valve sclerosis is present, with no evidence of aortic valve stenosis. Aortic valve mean gradient measures 4.0 mmHg. Aortic valve peak gradient measures 7.1 mmHg. Aortic valve area, by VTI measures 1.81 cm. Pulmonic Valve: The pulmonic valve was normal in structure. Pulmonic valve regurgitation is not visualized. Aorta: Aortic root could not be assessed. IAS/Shunts: No atrial level shunt detected by color flow Doppler.  LEFT VENTRICLE PLAX 2D LVIDd:         3.76 cm  Diastology LVIDs:         2.87 cm  LV e' medial:    4.46 cm/s LV PW:         1.75 cm  LV E/e' medial:  17.1 LV IVS:        1.87 cm  LV e' lateral:   5.87 cm/s LVOT diam:     1.90 cm  LV E/e' lateral: 13.0 LV SV:         37 LV SV Index:   17 LVOT Area:     2.84 cm  RIGHT VENTRICLE RV S prime:     11.10 cm/s TAPSE (M-mode): 2.4 cm LEFT ATRIUM             Index LA diam:        3.20 cm 1.50 cm/m LA Vol (A2C):    19.3 ml 9.06 ml/m LA Vol (A4C):   39.4 ml 18.49 ml/m LA  Biplane Vol: 28.1 ml 13.19 ml/m  AORTIC VALVE                   PULMONIC VALVE AV Area (Vmax):    1.59 cm    PV Vmax:       1.26 m/s AV Area (Vmean):   1.84 cm    PV Peak grad:  6.4 mmHg AV Area (VTI):     1.81 cm AV Vmax:           133.00 cm/s AV Vmean:          89.100 cm/s AV VTI:            0.202 m AV Peak Grad:      7.1 mmHg AV Mean Grad:      4.0 mmHg LVOT Vmax:         74.40 cm/s LVOT Vmean:        57.800 cm/s LVOT VTI:          0.129 m LVOT/AV VTI ratio: 0.64  AORTA Ao Root diam: 3.50 cm MV E velocity: 76.30 cm/s MV A velocity: 89.50 cm/s  SHUNTS MV E/A ratio:  0.85        Systemic VTI:  0.13 m                            Systemic Diam: 1.90 cm Dwayne Salome Arnt MD Electronically signed by Alwyn Pea MD Signature Date/Time: 11/26/2019/12:26:34 PM    Final       Subjective: Patient seen and examined bedside, resting comfortably.  Chest pain has been resolved that hypertension under control.  Discussed need for complete tobacco cessation and need for Covid-19 vaccination.  No other complaints or concerns at this time.  Denies headache, no fever/chills/night sweats, no nausea/vomiting/diarrhea, no chest pain, no shortness of breath, no abdominal pain, no weakness, no fatigue, no paresthesias.  No acute events overnight per nursing staff.  Discharge Exam: Vitals:   11/27/19 0537 11/27/19 0851  BP: 130/90 (!) 143/91  Pulse: 82 82  Resp: 16 11  Temp: 97.6 F (36.4 C) (!) 97.5 F (36.4 C)  SpO2: 99% 97%   Vitals:   11/26/19 2333 11/27/19 0500 11/27/19 0537 11/27/19 0851  BP: (!) 141/74  130/90 (!) 143/91  Pulse: 90  82 82  Resp: Temp: 97.8 F (36.6 C)  97.6 F (36.4 C) (!) 97.5 F (36.4 C)  TempSrc: Oral  Oral Oral  SpO2: 98%  99% 97%  Weight:  91 kg    Height:        General: Pt is alert, awake, not in acute distress Cardiovascular: RRR, S1/S2 +, no rubs, no gallops Respiratory: CTA bilaterally, no  wheezing, no rhonchi Abdominal: Soft, NT, ND, bowel sounds + Extremities: no edema, no cyanosis Integumentary: See pictures below.         The results of significant diagnostics from this hospitalization (including imaging, microbiology, ancillary and laboratory) are listed below for reference.     Microbiology: Recent Results (from the past 240 hour(s))  Respiratory Panel by RT PCR (Flu A&B, Covid) - Nasopharyngeal Swab     Status: Abnormal   Collection Time: 11/25/19 12:24 PM   Specimen: Nasopharyngeal Swab  Result Value Ref Range Status   SARS Coronavirus 2 by RT PCR POSITIVE (A) NEGATIVE Final    Comment: RESULT CALLED TO, READ BACK BY AND VERIFIED WITH: ANGELA ROBBINS AT 1316  ON 11/25/2019 [facility]. (NOTE) SARS-CoV-2 target nucleic acids are DETECTED.  SARS-CoV-2 RNA is generally detectable in upper respiratory specimens  during the acute phase of infection. Positive results are indicative of the presence of the identified virus, but do not rule out bacterial infection or co-infection with other pathogens not detected by the test. Clinical correlation with patient history and other diagnostic information is necessary to determine patient infection status. The expected result is Negative.  Fact Sheet for Patients:  https://www.moore.com/  Fact Sheet for Healthcare Providers: https://www.young.biz/  This test is not yet approved or cleared by the [place] FDA and  has been authorized for detection and/or diagnosis of SARS-CoV-2 by FDA under an Emergency Use Authorization (EUA).  This EUA will remain in effect (meaning this test c an be used) for the duration of  the COVID-19 declaration under Section 564(b)(1) of the Act, 21 U.S.C. section 360bbb-3(b)(1), unless the authorization is terminated or revoked sooner.      Influenza A by PCR NEGATIVE NEGATIVE Final   Influenza B by PCR NEGATIVE NEGATIVE Final    Comment:  (NOTE) The Xpert Xpress SARS-CoV-2/FLU/RSV assay is intended as an aid in  the diagnosis of influenza from Nasopharyngeal swab specimens and  should not be used as a sole basis for treatment. Nasal washings and  aspirates are unacceptable for Xpert Xpress SARS-CoV-2/FLU/RSV  testing.  Fact Sheet for Patients: https://www.moore.com/  Fact Sheet for Healthcare Providers: https://www.young.biz/  This test is not yet approved or cleared by the [place] FDA and  has been authorized for detection and/or diagnosis of SARS-CoV-2 by  FDA under an Emergency Use Authorization (EUA). This EUA will remain  in effect (meaning this test can be used) for the duration of the  Covid-19 declaration under Section 564(b)(1) of the Act, 21  U.S.C. section 360bbb-3(b)(1), unless the authorization is  terminated or revoked. Performed at [facility], [address] [place], [place] [address]      Labs: BNP (last 3 results) Recent Labs    11/25/19 1904  BNP 27.6   Basic Metabolic Panel: Recent Labs  Lab 11/25/19 1207  NA 139  K 4.2  CL 107  CO2 22  GLUCOSE 114*  BUN 17  CREATININE 1.09  CALCIUM 9.1   Liver Function Tests: Recent Labs  Lab 11/25/19 1920  AST 20  ALT 18  ALKPHOS 74  BILITOT 0.5  PROT 7.1  ALBUMIN 3.7   No results for input(s): LIPASE, AMYLASE in the last 168 hours. No results for input(s): AMMONIA in the last 168 hours. CBC: Recent Labs  Lab 11/25/19 1207  WBC 5.6  HGB 16.7  HCT 48.0  MCV 88.2  PLT 263   Cardiac Enzymes: No results for input(s): CKTOTAL, CKMB, CKMBINDEX, TROPONINI in the last 168 hours. BNP: Invalid input(s): POCBNP CBG: No results for input(s): GLUCAP in the last 168 hours. D-Dimer No results for input(s): DDIMER in the last 72 hours. Hgb A1c Recent Labs    11/26/19 0702  HGBA1C 5.7*   Lipid Profile Recent Labs    11/26/19 0702  CHOL 266*  HDL 46  LDLCALC 191*  TRIG  145  CHOLHDL 5.8   Thyroid function studies No results for input(s): TSH, T4TOTAL, T3FREE, THYROIDAB in the last 72 hours.  Invalid input(s): FREET3 Anemia work up No results for input(s): VITAMINB12, FOLATE, FERRITIN, TIBC, IRON, RETICCTPCT in the last 72 hours. Urinalysis    Component Value Date/Time   COLORURINE YELLOW (A) 11/25/2019 1759  APPEARANCEUR CLEAR (A) 11/25/2019 1759   APPEARANCEUR Clear 06/30/2013 1513   LABSPEC 1.018 11/25/2019 1759   LABSPEC 1.015 06/30/2013 1513   PHURINE 5.0 11/25/2019 1759   GLUCOSEU NEGATIVE 11/25/2019 1759   GLUCOSEU Negative 06/30/2013 1513   HGBUR SMALL (A) 11/25/2019 1759   BILIRUBINUR NEGATIVE 11/25/2019 1759   BILIRUBINUR Negative 06/30/2013 1513   KETONESUR NEGATIVE 11/25/2019 1759   PROTEINUR 30 (A) 11/25/2019 1759   NITRITE NEGATIVE 11/25/2019 1759   LEUKOCYTESUR NEGATIVE 11/25/2019 1759   LEUKOCYTESUR Negative 06/30/2013 1513   Sepsis Labs Invalid input(s): PROCALCITONIN,  WBC,  LACTICIDVEN Microbiology Recent Results (from the past 240 hour(s))  Respiratory Panel by RT PCR (Flu A&B, Covid) - Nasopharyngeal Swab     Status: Abnormal   Collection Time: 11/25/19 12:24 PM   Specimen: Nasopharyngeal Swab  Result Value Ref Range Status   SARS Coronavirus 2 by RT PCR POSITIVE (A) NEGATIVE Final    Comment: RESULT CALLED TO, READ BACK BY AND VERIFIED WITH: ANGELA ROBBINS AT 1316 ON 11/25/2019 [facility]. (NOTE) SARS-CoV-2 target nucleic acids are DETECTED.  SARS-CoV-2 RNA is generally detectable in upper respiratory specimens  during the acute phase of infection. Positive results are indicative of the presence of the identified virus, but do not rule out bacterial infection or co-infection with other pathogens not detected by the test. Clinical correlation with patient history and other diagnostic information is necessary to determine patient infection status. The expected result is Negative.  Fact Sheet for Patients:   https://www.moore.com/https://www.fda.gov/media/142436/download  Fact Sheet for Healthcare Providers: https://www.young.biz/https://www.fda.gov/media/142435/download  This test is not yet approved or cleared by the [place] FDA and  has been authorized for detection and/or diagnosis of SARS-CoV-2 by FDA under an Emergency Use Authorization (EUA).  This EUA will remain in effect (meaning this test c an be used) for the duration of  the COVID-19 declaration under Section 564(b)(1) of the Act, 21 U.S.C. section 360bbb-3(b)(1), unless the authorization is terminated or revoked sooner.      Influenza A by PCR NEGATIVE NEGATIVE Final   Influenza B by PCR NEGATIVE NEGATIVE Final    Comment: (NOTE) The Xpert Xpress SARS-CoV-2/FLU/RSV assay is intended as an aid in  the diagnosis of influenza from Nasopharyngeal swab specimens and  should not be used as a sole basis for treatment. Nasal washings and  aspirates are unacceptable for Xpert Xpress SARS-CoV-2/FLU/RSV  testing.  Fact Sheet for Patients: https://www.moore.com/https://www.fda.gov/media/142436/download  Fact Sheet for Healthcare Providers: https://www.young.biz/https://www.fda.gov/media/142435/download  This test is not yet approved or cleared by the [place] FDA and  has been authorized for detection and/or diagnosis of SARS-CoV-2 by  FDA under an Emergency Use Authorization (EUA). This EUA will remain  in effect (meaning this test can be used) for the duration of the  Covid-19 declaration under Section 564(b)(1) of the Act, 21  U.S.C. section 360bbb-3(b)(1), unless the authorization is  terminated or revoked. Performed at [facility], [address] [place], [place] [address]      Time coordinating discharge: Over 30 minutes  SIGNED:   Alvira PhilipsEric J [place], DO  [facility] 11/27/2019, 9:16 AM

## 2019-11-27 NOTE — Progress Notes (Signed)
Orderly present for pt discharge from 1C to the ED parking lot to his personal automobile

## 2019-12-17 ENCOUNTER — Other Ambulatory Visit: Payer: Self-pay

## 2020-01-23 ENCOUNTER — Encounter: Payer: Self-pay | Admitting: Emergency Medicine

## 2020-01-23 ENCOUNTER — Emergency Department: Payer: Self-pay

## 2020-01-23 ENCOUNTER — Other Ambulatory Visit: Payer: Self-pay

## 2020-01-23 DIAGNOSIS — I214 Non-ST elevation (NSTEMI) myocardial infarction: Secondary | ICD-10-CM

## 2020-01-23 DIAGNOSIS — Z8616 Personal history of COVID-19: Secondary | ICD-10-CM

## 2020-01-23 DIAGNOSIS — N179 Acute kidney failure, unspecified: Secondary | ICD-10-CM | POA: Diagnosis present

## 2020-01-23 DIAGNOSIS — K219 Gastro-esophageal reflux disease without esophagitis: Secondary | ICD-10-CM | POA: Diagnosis present

## 2020-01-23 DIAGNOSIS — R7303 Prediabetes: Secondary | ICD-10-CM | POA: Diagnosis present

## 2020-01-23 DIAGNOSIS — Z79899 Other long term (current) drug therapy: Secondary | ICD-10-CM

## 2020-01-23 DIAGNOSIS — F17210 Nicotine dependence, cigarettes, uncomplicated: Secondary | ICD-10-CM | POA: Diagnosis present

## 2020-01-23 DIAGNOSIS — I129 Hypertensive chronic kidney disease with stage 1 through stage 4 chronic kidney disease, or unspecified chronic kidney disease: Secondary | ICD-10-CM | POA: Diagnosis present

## 2020-01-23 DIAGNOSIS — N189 Chronic kidney disease, unspecified: Secondary | ICD-10-CM | POA: Diagnosis present

## 2020-01-23 DIAGNOSIS — I2510 Atherosclerotic heart disease of native coronary artery without angina pectoris: Secondary | ICD-10-CM | POA: Diagnosis present

## 2020-01-23 DIAGNOSIS — E785 Hyperlipidemia, unspecified: Secondary | ICD-10-CM | POA: Diagnosis present

## 2020-01-23 HISTORY — DX: Non-ST elevation (NSTEMI) myocardial infarction: I21.4

## 2020-01-23 LAB — CBC WITH DIFFERENTIAL/PLATELET
Abs Immature Granulocytes: 0.02 10*3/uL (ref 0.00–0.07)
Basophils Absolute: 0.1 10*3/uL (ref 0.0–0.1)
Basophils Relative: 1 %
Eosinophils Absolute: 0.6 10*3/uL — ABNORMAL HIGH (ref 0.0–0.5)
Eosinophils Relative: 7 %
HCT: 45.1 % (ref 39.0–52.0)
Hemoglobin: 15.4 g/dL (ref 13.0–17.0)
Immature Granulocytes: 0 %
Lymphocytes Relative: 29 %
Lymphs Abs: 2.5 10*3/uL (ref 0.7–4.0)
MCH: 29.8 pg (ref 26.0–34.0)
MCHC: 34.1 g/dL (ref 30.0–36.0)
MCV: 87.4 fL (ref 80.0–100.0)
Monocytes Absolute: 0.6 10*3/uL (ref 0.1–1.0)
Monocytes Relative: 7 %
Neutro Abs: 4.8 10*3/uL (ref 1.7–7.7)
Neutrophils Relative %: 56 %
Platelets: 361 10*3/uL (ref 150–400)
RBC: 5.16 MIL/uL (ref 4.22–5.81)
RDW: 13 % (ref 11.5–15.5)
WBC: 8.7 10*3/uL (ref 4.0–10.5)
nRBC: 0 % (ref 0.0–0.2)

## 2020-01-23 NOTE — ED Triage Notes (Signed)
EMS brings pt in from home for c/o CP intermittently x 2wks, worse tonight; pt denies any c/o at present; pt to triage via w/c with no distress noted

## 2020-01-24 ENCOUNTER — Inpatient Hospital Stay
Admission: EM | Admit: 2020-01-24 | Discharge: 2020-01-25 | DRG: 247 | Disposition: A | Discharge status: Home / Self Care | Payer: Self-pay | Attending: Internal Medicine | Admitting: Internal Medicine

## 2020-01-24 ENCOUNTER — Other Ambulatory Visit: Payer: Self-pay

## 2020-01-24 ENCOUNTER — Inpatient Hospital Stay (HOSPITAL_COMMUNITY)
Admit: 2020-01-24 | Discharge: 2020-01-24 | Disposition: A | Discharge status: Home / Self Care | Payer: Self-pay | Attending: Cardiology | Admitting: Cardiology

## 2020-01-24 ENCOUNTER — Encounter: Payer: Self-pay | Admitting: Internal Medicine

## 2020-01-24 ENCOUNTER — Encounter
Admission: EM | Disposition: A | Discharge status: Home / Self Care | Payer: Self-pay | Source: Home / Self Care | Attending: Family Medicine

## 2020-01-24 DIAGNOSIS — I214 Non-ST elevation (NSTEMI) myocardial infarction: Secondary | ICD-10-CM

## 2020-01-24 DIAGNOSIS — K219 Gastro-esophageal reflux disease without esophagitis: Secondary | ICD-10-CM

## 2020-01-24 DIAGNOSIS — I2510 Atherosclerotic heart disease of native coronary artery without angina pectoris: Secondary | ICD-10-CM

## 2020-01-24 DIAGNOSIS — N179 Acute kidney failure, unspecified: Secondary | ICD-10-CM

## 2020-01-24 DIAGNOSIS — Z720 Tobacco use: Secondary | ICD-10-CM | POA: Diagnosis present

## 2020-01-24 DIAGNOSIS — I10 Essential (primary) hypertension: Secondary | ICD-10-CM

## 2020-01-24 DIAGNOSIS — E785 Hyperlipidemia, unspecified: Secondary | ICD-10-CM | POA: Diagnosis present

## 2020-01-24 DIAGNOSIS — E7800 Pure hypercholesterolemia, unspecified: Secondary | ICD-10-CM

## 2020-01-24 DIAGNOSIS — E7849 Other hyperlipidemia: Secondary | ICD-10-CM | POA: Diagnosis present

## 2020-01-24 DIAGNOSIS — Z87891 Personal history of nicotine dependence: Secondary | ICD-10-CM | POA: Diagnosis present

## 2020-01-24 HISTORY — PX: LEFT HEART CATH AND CORONARY ANGIOGRAPHY: CATH118249

## 2020-01-24 HISTORY — PX: CORONARY STENT INTERVENTION: CATH118234

## 2020-01-24 LAB — ECHOCARDIOGRAM COMPLETE
AR max vel: 2.16 cm2
AV Area VTI: 2.28 cm2
AV Area mean vel: 1.97 cm2
AV Mean grad: 4 mmHg
AV Peak grad: 6.1 mmHg
Ao pk vel: 1.24 m/s
Area-P 1/2: 3.17 cm2
Calc EF: 47.8 %
S' Lateral: 2.3 cm
Single Plane A2C EF: 45.5 %
Single Plane A4C EF: 48 %

## 2020-01-24 LAB — BASIC METABOLIC PANEL
Anion gap: 10 (ref 5–15)
BUN: 18 mg/dL (ref 6–20)
CO2: 23 mmol/L (ref 22–32)
Calcium: 8.9 mg/dL (ref 8.9–10.3)
Chloride: 106 mmol/L (ref 98–111)
Creatinine, Ser: 1.33 mg/dL — ABNORMAL HIGH (ref 0.61–1.24)
GFR, Estimated: >60 mL/min (ref >60)
Glucose, Bld: 127 mg/dL — ABNORMAL HIGH (ref 70–99)
Potassium: 3.8 mmol/L (ref 3.5–5.1)
Sodium: 139 mmol/L (ref 135–145)

## 2020-01-24 LAB — CBC
HCT: 40.1 % (ref 39.0–52.0)
Hemoglobin: 14.2 g/dL (ref 13.0–17.0)
MCH: 30.7 pg (ref 26.0–34.0)
MCHC: 35.4 g/dL (ref 30.0–36.0)
MCV: 86.6 fL (ref 80.0–100.0)
Platelets: 324 10*3/uL (ref 150–400)
RBC: 4.63 MIL/uL (ref 4.22–5.81)
RDW: 13.2 % (ref 11.5–15.5)
WBC: 10.1 10*3/uL (ref 4.0–10.5)
nRBC: 0 % (ref 0.0–0.2)

## 2020-01-24 LAB — COMPREHENSIVE METABOLIC PANEL
ALT: 31 U/L (ref 0–44)
AST: 37 U/L (ref 15–41)
Albumin: 4 g/dL (ref 3.5–5.0)
Alkaline Phosphatase: 85 U/L (ref 38–126)
Anion gap: 8 (ref 5–15)
BUN: 17 mg/dL (ref 6–20)
CO2: 23 mmol/L (ref 22–32)
Calcium: 8.9 mg/dL (ref 8.9–10.3)
Chloride: 107 mmol/L (ref 98–111)
Creatinine, Ser: 1.39 mg/dL — ABNORMAL HIGH (ref 0.61–1.24)
GFR, Estimated: >60 mL/min (ref >60)
Glucose, Bld: 122 mg/dL — ABNORMAL HIGH (ref 70–99)
Potassium: 3.9 mmol/L (ref 3.5–5.1)
Sodium: 138 mmol/L (ref 135–145)
Total Bilirubin: 0.4 mg/dL (ref 0.3–1.2)
Total Protein: 7.7 g/dL (ref 6.5–8.1)

## 2020-01-24 LAB — TROPONIN I (HIGH SENSITIVITY)
Troponin I (High Sensitivity): 3520 ng/L (ref <18)
Troponin I (High Sensitivity): 2990 ng/L (ref <18)

## 2020-01-24 LAB — HEPARIN LEVEL (UNFRACTIONATED): Heparin Unfractionated: 0.21 IU/mL — ABNORMAL LOW (ref 0.30–0.70)

## 2020-01-24 LAB — LIPID PANEL
Cholesterol: 173 mg/dL (ref 0–200)
HDL: 38 mg/dL — ABNORMAL LOW (ref >40)
LDL Cholesterol: 83 mg/dL (ref 0–99)
Total CHOL/HDL Ratio: 4.6 RATIO
Triglycerides: 260 mg/dL — ABNORMAL HIGH (ref <150)
VLDL: 52 mg/dL — ABNORMAL HIGH (ref 0–40)

## 2020-01-24 LAB — PROTIME-INR
INR: 0.9 (ref 0.8–1.2)
Prothrombin Time: 11.9 seconds (ref 11.4–15.2)

## 2020-01-24 LAB — APTT: aPTT: 29 seconds (ref 24–36)

## 2020-01-24 LAB — POCT ACTIVATED CLOTTING TIME: Activated Clotting Time: 309 seconds

## 2020-01-24 LAB — GLUCOSE, CAPILLARY: Glucose-Capillary: 101 mg/dL — ABNORMAL HIGH (ref 70–99)

## 2020-01-24 SURGERY — LEFT HEART CATH AND CORONARY ANGIOGRAPHY
Anesthesia: Moderate Sedation

## 2020-01-24 MED ORDER — SODIUM CHLORIDE 0.9% FLUSH: 3.0000 mL | INTRAVENOUS | Status: DC | PRN

## 2020-01-24 MED ORDER — ASPIRIN EC 81 MG PO TBEC
81.0000 mg | DELAYED_RELEASE_TABLET | Freq: Every day | ORAL | Status: DC
  Administered 2020-01-25: 81 mg via ORAL
  Filled 2020-01-24: qty 1

## 2020-01-24 MED ORDER — PANTOPRAZOLE SODIUM 40 MG PO TBEC
40.0000 mg | DELAYED_RELEASE_TABLET | Freq: Every day | ORAL | Status: DC
  Administered 2020-01-25: 40 mg via ORAL
  Filled 2020-01-24: qty 1

## 2020-01-24 MED ORDER — VERAPAMIL HCL 2.5 MG/ML IV SOLN
INTRAVENOUS | Status: AC
  Filled 2020-01-24: qty 2

## 2020-01-24 MED ORDER — AMLODIPINE BESYLATE 10 MG PO TABS: 10.0000 mg | ORAL_TABLET | Freq: Every day | ORAL | Status: DC

## 2020-01-24 MED ORDER — HEPARIN (PORCINE) IN NACL 1000-0.9 UT/500ML-% IV SOLN
INTRAVENOUS | Status: AC
  Filled 2020-01-24: qty 1000

## 2020-01-24 MED ORDER — HEPARIN SODIUM (PORCINE) 1000 UNIT/ML IJ SOLN
INTRAMUSCULAR | Status: AC
  Filled 2020-01-24: qty 1

## 2020-01-24 MED ORDER — SODIUM CHLORIDE 0.9 % IV SOLN: INTRAVENOUS | Status: DC

## 2020-01-24 MED ORDER — HEPARIN SODIUM (PORCINE) 1000 UNIT/ML IJ SOLN
INTRAMUSCULAR | Status: DC | PRN
  Administered 2020-01-24 (×2): 5000 [IU] via INTRAVENOUS

## 2020-01-24 MED ORDER — ASPIRIN 81 MG PO CHEW
CHEWABLE_TABLET | ORAL | Status: AC
  Filled 2020-01-24: qty 2

## 2020-01-24 MED ORDER — NITROGLYCERIN 1 MG/10 ML FOR IR/CATH LAB
INTRA_ARTERIAL | Status: DC | PRN
  Administered 2020-01-24: 200 ug via INTRACORONARY
  Administered 2020-01-24: 150 ug via INTRACORONARY

## 2020-01-24 MED ORDER — SODIUM CHLORIDE 0.9% FLUSH
3.0000 mL | Freq: Two times a day (BID) | INTRAVENOUS | Status: DC
  Administered 2020-01-25: 3 mL via INTRAVENOUS

## 2020-01-24 MED ORDER — LIDOCAINE HCL (PF) 1 % IJ SOLN
INTRAMUSCULAR | Status: AC
  Filled 2020-01-24: qty 30

## 2020-01-24 MED ORDER — ASPIRIN 81 MG PO CHEW: 81.0000 mg | CHEWABLE_TABLET | ORAL | Status: DC

## 2020-01-24 MED ORDER — TICAGRELOR 90 MG PO TABS
ORAL_TABLET | ORAL | Status: AC
  Filled 2020-01-24: qty 2

## 2020-01-24 MED ORDER — CARVEDILOL 6.25 MG PO TABS
6.2500 mg | ORAL_TABLET | Freq: Two times a day (BID) | ORAL | Status: DC
  Administered 2020-01-24 – 2020-01-25 (×2): 6.25 mg via ORAL
  Filled 2020-01-24 (×6): qty 1

## 2020-01-24 MED ORDER — TICAGRELOR 90 MG PO TABS
90.0000 mg | ORAL_TABLET | Freq: Two times a day (BID) | ORAL | Status: DC
  Administered 2020-01-25: 90 mg via ORAL
  Filled 2020-01-24: qty 1

## 2020-01-24 MED ORDER — ENOXAPARIN SODIUM 40 MG/0.4ML ~~LOC~~ SOLN
40.0000 mg | SUBCUTANEOUS | Status: DC
  Administered 2020-01-25: 40 mg via SUBCUTANEOUS
  Filled 2020-01-24: qty 0.4

## 2020-01-24 MED ORDER — FENTANYL CITRATE (PF) 100 MCG/2ML IJ SOLN
INTRAMUSCULAR | Status: DC | PRN
  Administered 2020-01-24 (×2): 50 ug via INTRAVENOUS

## 2020-01-24 MED ORDER — ATORVASTATIN CALCIUM 20 MG PO TABS: 40.0000 mg | ORAL_TABLET | Freq: Every day | ORAL | Status: DC

## 2020-01-24 MED ORDER — NITROGLYCERIN 0.4 MG SL SUBL: 0.4000 mg | SUBLINGUAL_TABLET | SUBLINGUAL | Status: DC | PRN

## 2020-01-24 MED ORDER — MIDAZOLAM HCL 2 MG/2ML IJ SOLN
INTRAMUSCULAR | Status: AC
  Filled 2020-01-24: qty 2

## 2020-01-24 MED ORDER — SODIUM CHLORIDE 0.9% FLUSH
3.0000 mL | Freq: Two times a day (BID) | INTRAVENOUS | Status: DC
  Administered 2020-01-25 (×2): 3 mL via INTRAVENOUS

## 2020-01-24 MED ORDER — ASPIRIN 81 MG PO CHEW
CHEWABLE_TABLET | ORAL | Status: DC | PRN
  Administered 2020-01-24: 162 mg via ORAL

## 2020-01-24 MED ORDER — MIDAZOLAM HCL 2 MG/2ML IJ SOLN
INTRAMUSCULAR | Status: DC | PRN
  Administered 2020-01-24 (×2): 1 mg via INTRAVENOUS

## 2020-01-24 MED ORDER — IOHEXOL 300 MG/ML  SOLN
INTRAMUSCULAR | Status: DC | PRN
  Administered 2020-01-24: 140 mL

## 2020-01-24 MED ORDER — TICAGRELOR 90 MG PO TABS
ORAL_TABLET | ORAL | Status: DC | PRN
  Administered 2020-01-24: 180 mg via ORAL

## 2020-01-24 MED ORDER — ASPIRIN 81 MG PO CHEW
324.0000 mg | CHEWABLE_TABLET | Freq: Once | ORAL | Status: AC
  Filled 2020-01-24: qty 4

## 2020-01-24 MED ORDER — FENTANYL CITRATE (PF) 100 MCG/2ML IJ SOLN
INTRAMUSCULAR | Status: AC
  Filled 2020-01-24: qty 2

## 2020-01-24 MED ORDER — LIDOCAINE HCL (PF) 1 % IJ SOLN
INTRAMUSCULAR | Status: DC | PRN
  Administered 2020-01-24: 2 mL

## 2020-01-24 MED ORDER — SODIUM CHLORIDE 0.9 % IV SOLN: 250.0000 mL | INTRAVENOUS | Status: DC | PRN

## 2020-01-24 MED ORDER — ONDANSETRON HCL 4 MG/2ML IJ SOLN: 4.0000 mg | Freq: Four times a day (QID) | INTRAMUSCULAR | Status: DC | PRN

## 2020-01-24 MED ORDER — METOPROLOL TARTRATE 25 MG PO TABS: 12.5000 mg | ORAL_TABLET | Freq: Two times a day (BID) | ORAL | Status: DC

## 2020-01-24 MED ORDER — ACETAMINOPHEN 325 MG PO TABS: 650.0000 mg | ORAL_TABLET | ORAL | Status: DC | PRN

## 2020-01-24 MED ORDER — HEPARIN BOLUS VIA INFUSION
4000.0000 [IU] | Freq: Once | INTRAVENOUS | Status: AC
  Administered 2020-01-24: 4000 [IU] via INTRAVENOUS
  Filled 2020-01-24: qty 4000

## 2020-01-24 MED ORDER — HEPARIN BOLUS VIA INFUSION
1400.0000 [IU] | Freq: Once | INTRAVENOUS | Status: AC
  Administered 2020-01-24: 1400 [IU] via INTRAVENOUS
  Filled 2020-01-24: qty 1400

## 2020-01-24 MED ORDER — HEPARIN (PORCINE) 25000 UT/250ML-% IV SOLN
1500.0000 [IU]/h | INTRAVENOUS | Status: DC
  Administered 2020-01-24: 1300 [IU]/h via INTRAVENOUS
  Filled 2020-01-24: qty 250

## 2020-01-24 MED ORDER — VERAPAMIL HCL 2.5 MG/ML IV SOLN
INTRAVENOUS | Status: DC | PRN
  Administered 2020-01-24 (×2): 2.5 mg via INTRA_ARTERIAL

## 2020-01-24 MED ORDER — HEPARIN (PORCINE) IN NACL 1000-0.9 UT/500ML-% IV SOLN
INTRAVENOUS | Status: DC | PRN
  Administered 2020-01-24: 500 mL

## 2020-01-24 MED ORDER — SODIUM CHLORIDE 0.9 % IV SOLN: INTRAVENOUS | Status: AC

## 2020-01-24 SURGICAL SUPPLY — 14 items
BALLN TREK RX 2.5X12 (BALLOONS) ×3
BALLN ~~LOC~~ EUPHORA RX 3.0X15 (BALLOONS) ×3
BALLOON TREK RX 2.5X12 (BALLOONS) IMPLANT
BALLOON ~~LOC~~ EUPHORA RX 3.0X15 (BALLOONS) IMPLANT
CATH INFINITI 5FR JK (CATHETERS) ×2 IMPLANT
CATH LAUNCHER 6FR EBU3.5 (CATHETERS) ×2 IMPLANT
DEVICE RAD TR BAND REGULAR (VASCULAR PRODUCTS) ×2 IMPLANT
GLIDESHEATH SLEND SS 6F .021 (SHEATH) ×2 IMPLANT
GUIDEWIRE INQWIRE 1.5J.035X260 (WIRE) IMPLANT
INQWIRE 1.5J .035X260CM (WIRE) ×3
KIT MANI 3VAL PERCEP (MISCELLANEOUS) ×3 IMPLANT
PACK CARDIAC CATH (CUSTOM PROCEDURE TRAY) ×3 IMPLANT
STENT RESOLUTE ONYX 2.75X22 (Permanent Stent) ×2 IMPLANT
WIRE RUNTHROUGH .014X180CM (WIRE) ×2 IMPLANT

## 2020-01-24 NOTE — Progress Notes (Signed)
44 year old gentleman with history of hypertension, hyperlipidemia, tobacco abuse, CAD in both parents who was admitted early morning secondary to chest pain and possible non-ST elevation MI.  Seen and examined.  Still in the ED.  He tells me that he does not have any chest pain but he feels generalized weakness.  No shortness of breath or any other complaint.  Waiting anxiously for cardiology to see him.  Troponin significantly elevated, likely indicating non-ST elevation MI.  Will likely require cardiac catheterization.  Will defer to cardiology about that decision.  Continue heparin in the meantime.  On examination, he is obese, alert and oriented, lungs clear to auscultation.  No peripheral edema.  Abdomen soft and nontender.

## 2020-01-24 NOTE — ED Notes (Signed)
Resumed care from [place], [place]. Wife at bedside. No needs at this time. Awaiting cath lab, pt stated they told him about 12pm he would go to cath lab, state already met cardiologist this AM.

## 2020-01-24 NOTE — H&P (View-Only) (Signed)
Cardiology Consultation:   Patient ID: Logan Crawford MRN: [id number]; DOB: August 01, 1975  Admit date: 01/24/2020 Date of Consult: 01/24/2020  Primary Care Provider: Oneita Hurt No [facility] Cardiologist: Debbe Odea, MD  [facility] Electrophysiologist:  None    Patient Profile:   Logan Crawford is a 44 y.o. male with a hx of hypertension, hyperlipidemia, asthma, previous COVID-19 infection, and current tobacco use who is being seen today for the evaluation of non-ST elevation myocardial infarction at the request of Dr. Jacqulyn Bath.  History of Present Illness:   Logan Crawford is a 44 year old male with PMH as above.  He reports a family history of CAD with mother having stents placed and PPM placed recently.  He denies any family history of early cardiac death to his knowledge.  He is a current smoker and has recently cut back from 1 pack/day to 1 pack every 3 days.  He reports occasional alcohol use with 1 -2 beers every 1 to 2 weeks.  He reports that back in October 2021 he had COVID-19.  Echo performed at that time showed EF 50 to 55%, G1DD, mild aortic sclerosis without stenosis.  He was reportedly then diagnosed with high blood pressure and started on blood pressure medication (amlodipine 10mg  daily, lisinopril 20mg  daily) that he unfortunately ran out of and did not refill.  On review of EMR, he was also started on Lipitor 40 mg daily.  He did not follow-up with a cardiologist, was lost to follow-up.  Approximately 2 weeks ago, he reports having episodes of chest pain that started both with exertion and at rest.  Initially, these episodes lasted approximately 5 minutes.  They are further characterized as a generalized precordial/anterior chest feeling of acid reflux that would self resolve.  Yesterday, he was bringing groceries in from outside when he had another episode of chest discomfort and eventually went away.  He then had an additional episode while lying in bed that lasted for  1 hour.  He attempted to take Tums without relief.  He further describes the CP as a severe reflux that was nonpositional, nonradiating, not tender to palpation, and not pleuritic in nature.  Associated symptoms included malaise/fatigue and shortness of breath. He reports some diaphoresis but also states this may have been related to the level of pain. He reports that the fatigue had been persistent for some time when thinking on it today and possibly since his COVID-19 infection.    He decided to present to the [facility] emergency department.   In the [facility] ED, initial vitals significant for 143/89 with heart rate 110 bpm and 97% on room air.  Labs significant for potassium 3.8, creatinine 1.39  1.33, BUN 17   18, high-sensitivity troponin 3520.0  2990.0. H&H stable.  Chest x-ray without active disease.Initial EKG NSR, 88 bpm, ischemic changes noted in the anterolateral and septal leads with TWI most notable in I, avL, V1-V6.  At the time of evaluation, he was CP free but reported an episode of CP that had resolved approximately 5 minutes before my entry into the room.   Past Medical History:  Diagnosis Date  . Hyperlipidemia   . Hypertension   . Tobacco use 11/25/2019    Past Surgical History:  Procedure Laterality Date  . IRRIGATION AND DEBRIDEMENT KNEE Left   . TYMPANOSTOMY TUBE PLACEMENT Bilateral      Home Medications:  Prior to Admission medications   Medication Sig Start Date End Date Taking? Authorizing Provider  albuterol (VENTOLIN HFA)  108 (90 Base) MCG/ACT inhaler Inhale 2 puffs into the lungs every 6 (six) hours as needed for wheezing or shortness of breath. 11/27/19 02/25/20 Yes [place], Eric J, DO  amLODipine (NORVASC) 10 MG tablet Take 1 tablet (10 mg total) by mouth daily. 11/27/19 02/25/20 Yes [place], Eric J, DO  atorvastatin (LIPITOR) 40 MG tablet Take 1 tablet (40 mg total) by mouth daily. 11/27/19 02/25/20 Yes [place], Eric J, DO  lisinopril (ZESTRIL) 20 MG tablet Take 1 tablet (20  mg total) by mouth daily. 11/27/19 02/25/20 Yes [place], Eric J, DO  pantoprazole (PROTONIX) 40 MG tablet Take 1 tablet (40 mg total) by mouth daily. 11/27/19 02/25/20 Yes [place], Eric J, DO  sertraline (ZOLOFT) 50 MG tablet Take 50 mg by mouth daily.    [provider]    Inpatient Medications: Scheduled Meds: . amLODipine  10 mg Oral Daily  . [START ON 01/25/2020] aspirin EC  81 mg Oral Daily  . atorvastatin  40 mg Oral Daily  . metoprolol tartrate  12.5 mg Oral BID  . pantoprazole  40 mg Oral Daily   Continuous Infusions: . heparin 1,500 Units/hr (01/24/20 0832)   PRN Meds: acetaminophen, nitroGLYCERIN, ondansetron (ZOFRAN) IV  Allergies:   No Known Allergies  Social History:   Social History   Socioeconomic History  . Marital status: Single    Spouse name: Not on file  . Number of children: Not on file  . Years of education: Not on file  . Highest education level: Not on file  Occupational History  . Not on file  Tobacco Use  . Smoking status: Current Some Day Smoker  . Smokeless tobacco: Never Used  Vaping Use  . Vaping Use: Never used  Substance and Sexual Activity  . Alcohol use: Yes    Comment: rarely  . Drug use: No  . Sexual activity: Not on file  Other Topics Concern  . Not on file  Social History Narrative  . Not on file   Social Determinants of Health   Financial Resource Strain:   . Difficulty of Paying Living Expenses: Not on file  Food Insecurity:   . Worried About Programme researcher, broadcasting/film/video in the Last Year: Not on file  . Ran Out of Food in the Last Year: Not on file  Transportation Needs:   . Lack of Transportation (Medical): Not on file  . Lack of Transportation (Non-Medical): Not on file  Physical Activity:   . Days of Exercise per Week: Not on file  . Minutes of Exercise per Session: Not on file  Stress:   . Feeling of Stress : Not on file  Social Connections:   . Frequency of Communication with Friends and Family: Not on file  .  Frequency of Social Gatherings with Friends and Family: Not on file  . Attends Religious Services: Not on file  . Active Member of Clubs or Organizations: Not on file  . Attends Banker Meetings: Not on file  . Marital Status: Not on file  Intimate Partner Violence:   . Fear of Current or Ex-Partner: Not on file  . Emotionally Abused: Not on file  . Physically Abused: Not on file  . Sexually Abused: Not on file    Family History:    Family History  Problem Relation Age of Onset  . Coronary artery disease Mother   . Arrhythmia Mother        Pacemaker placed in 2021  . Coronary artery disease Father  ROS:  Please see the history of present illness.  Review of Systems  Constitutional: Positive for malaise/fatigue.  Respiratory: Positive for shortness of breath. Negative for hemoptysis.   Cardiovascular: Positive for chest pain. Negative for palpitations, orthopnea and leg swelling.  Gastrointestinal: Positive for heartburn. Negative for blood in stool, melena and vomiting.  Genitourinary: Negative for hematuria.  Musculoskeletal: Negative for falls.  Neurological: Negative for dizziness and loss of consciousness.  All other systems reviewed and are negative.   All other ROS reviewed and negative.     Physical Exam/Data:   Vitals:   01/24/20 0600 01/24/20 0700 01/24/20 0730 01/24/20 0830  BP: 132/72 121/79 111/86 116/79  Pulse: 74 77 82 72  Resp: 19 14 (!) 21 11  Temp:      TempSrc:      SpO2: 96% 99% 99% 99%  Weight:      Height:       No intake or output data in the 24 hours ending 01/24/20 0840 Last 3 Weights 01/23/2020 11/27/2019 11/25/2019  Weight (lbs) 205 lb 200 lb 9.9 oz 200 lb  Weight (kg) 92.987 kg 91 kg 90.719 kg     Body mass index is 27.8 kg/m.  General:  Well nourished, well developed, in no acute distress HEENT: normal Lymph: no adenopathy Neck: no JVD Endocrine:  No thryomegaly Vascular: No carotid bruits; FA pulses 2+ bilaterally  without bruits  Cardiac:  normal S1, S2; RRR; no murmur  Lungs:  clear to auscultation bilaterally, no wheezing, rhonchi or rales  Abd: soft, nontender, no hepatomegaly  Ext: no edema Musculoskeletal:  No deformities, BUE and BLE strength normal and equal Skin: warm and dry  Neuro:  CNs 2-12 intact, no focal abnormalities noted Psych:  Normal affect   EKG:  The EKG was personally reviewed and demonstrates:  NSR, 88 bpm, ischemic changes noted in the anterolateral and septal leads with TWI most notable in I, avL, V1-V6.  Telemetry:  Telemetry was personally reviewed and demonstrates: NSR, 70s to 90s, PVCs  Relevant CV Studies: Echo 11/2019 1. Left ventricular ejection fraction, by estimation, is 50 to 55%. The  left ventricle has low normal function. The left ventricle has no regional  wall motion abnormalities. Left ventricular diastolic parameters are  consistent with Grade I diastolic  dysfunction (impaired relaxation).  2. Right ventricular systolic function is normal. The right ventricular  size is normal.  3. The mitral valve is normal in structure. No evidence of mitral valve  regurgitation.  4. The aortic valve is calcified. Aortic valve regurgitation is not  visualized. Mild aortic valve sclerosis is present, with no evidence of  aortic valve stenosis.   Laboratory Data:  High Sensitivity Troponin:   Recent Labs  Lab 01/23/20 2325 01/24/20 0105  TROPONINIHS 3,520* 2,990*     Chemistry Recent Labs  Lab 01/23/20 2325 01/24/20 0359  NA 138 139  K 3.9 3.8  CL 107 106  CO2 23 23  GLUCOSE 122* 127*  BUN 17 18  CREATININE 1.39* 1.33*  CALCIUM 8.9 8.9  GFRNONAA >60 >60  ANIONGAP 8 10    Recent Labs  Lab 01/23/20 2325  PROT 7.7  ALBUMIN 4.0  AST 37  ALT 31  ALKPHOS 85  BILITOT 0.4   Hematology Recent Labs  Lab 01/23/20 2325 01/24/20 0359  WBC 8.7 10.1  RBC 5.16 4.63  HGB 15.4 14.2  HCT 45.1 40.1  MCV 87.4 86.6  MCH 29.8 30.7  MCHC 34.1  35.4  RDW 13.0 13.2  PLT 361 324   BNPNo results for input(s): BNP, PROBNP in the last 168 hours.  DDimer No results for input(s): DDIMER in the last 168 hours.   Radiology/Studies:  DG Chest 2 View  Result Date: 01/23/2020 CLINICAL DATA:  Chest pain EXAM: CHEST - 2 VIEW COMPARISON:  11/25/2019 FINDINGS: The heart size and mediastinal contours are within normal limits. Both lungs are clear. The visualized skeletal structures are unremarkable. IMPRESSION: No active cardiopulmonary disease. Electronically Signed   By: Helyn Numbers MD   On: 01/23/2020 23:40     Assessment and Plan:   1. Non STEMI: No current chest pain, though he continues to have episodes of chest pain that occur both with exertion and at rest with longest episode lasting 1 hour, leading to his presentation to the [facility] ED.  Episode started approximately 2 weeks ago.  Risk factors for cardiac etiology include family history, hypertension, hyperlipidemia, and current tobacco use.  High-sensitivity troponin elevated with peak 3520.0.  EKG with diffuse T wave inversion and changes noted in the septal and anterolateral leads.    Presentation consistent with ACS as above.  Further ischemic workup needed with Echo and LHC.  Echo ordered and pending official results.   LHC tentatively planned with Dr. Kirke Corin for later this afternoon. Risks and benefits of cardiac catheterization have been discussed with the patient.  These include bleeding, infection, kidney damage, stroke, heart attack, death.  The patient understands these risks and is willing to proceed.  NPO in preparation for LHC today 01/24/20.  IV heparin.   Daily CBC, BMET.  Continue medical management with ASA 81mg , metoprolol, atorvastatin, and as needed sublingual nitro for chest pain.    After catheterization, consider escalation of atorvastatin to 80 mg daily for risk factor modification / high intensity statin, consolidation of metoprolol to Toprol-XL, and  transition from amlodipine to losartan for BP control.   Further recommendations s/p echo and LHC.   2. Hypertension  Current BP well controlled at 111/86.  Goal BP 130/80 or lower. As above, following LHC, consider transition from amlodipine to losartan to avoid pt being on both BB and CCB. Continue BB with consolidation to Toprol before discharge. Will defer changes for now and until after LHC.  3. HLD, goal LDL <70  Total cholesterol 173 with LDL 83.  Goal LDL below 70.  Continue atorvastatin. As above, recommend increased to atorvastatin 80 mg daily after LHC for aggressive risk factor modification.  Repeat lipid and liver function in 6 to 8 weeks as an outpatient.  4. Tobacco use, current  Smoking cessation advised. Discussed his triggers today and stressed the importance of smoking cessation from a cardiovascular perspective and given the benefits of smoking cessation on his overall health.  He is in agreement and motivated to quit smoking.  Continue nicotine patch as needed.  5. CKD  Cr appears at his baseline on review of labs. Daily BMET to monitor. Most recent Cr 1.39  1.33 with BUN 18 as above.   6. Prediabetes  A1C 10/2 was 5.7. Per IM.         TIMI Risk Score for Unstable Angina or Non-ST Elevation MI:   The patient's TIMI risk score is 5, which indicates a 26% risk of all cause mortality, new or recurrent myocardial infarction or need for urgent revascularization in the next 14 days.           For questions or updates, please contact [facility]  Please consult [email or web address] for contact info under    Signed, Lennon AlstromJacquelyn D Marc Sivertsen, PA-C  01/24/2020 8:40 AM

## 2020-01-24 NOTE — Progress Notes (Signed)
*  PRELIMINARY RESULTS* Echocardiogram 2D Echocardiogram has been performed.  Cristela Blue 01/24/2020, 8:45 AM

## 2020-01-24 NOTE — Progress Notes (Signed)
   01/24/20 1233  Vitals  Temp (!) 97.4 F (36.3 C)  Temp Source Oral  BP 115/78  MAP (mmHg) 89  BP Location Right Arm  BP Method Automatic  Patient Position (if appropriate) Sitting  Pulse Rate 72  Resp 19  Level of Consciousness  Level of Consciousness Alert  MEWS COLOR  MEWS Score Color Green  Oxygen Therapy  SpO2 98 %  O2 Device Room Air   Pt arrived to room 253 at 1230. Pt A&O. Call bell within reached.

## 2020-01-24 NOTE — Interval H&P Note (Signed)
Cath Lab Visit (complete for each Cath Lab visit)  Clinical Evaluation Leading to the Procedure:   ACS: Yes.    Non-ACS:  n/a       History and Physical Interval Note:  01/24/2020 3:54 PM  Logan Crawford  has presented today for surgery, with the diagnosis of Non ST Elevation Myocardial Infarction.  The various methods of treatment have been discussed with the patient and family. After consideration of risks, benefits and other options for treatment, the patient has consented to  Procedure(s): LEFT HEART CATH AND CORONARY ANGIOGRAPHY (N/A) as a surgical intervention.  The patient's history has been reviewed, patient examined, no change in status, stable for surgery.  I have reviewed the patient's chart and labs.  Questions were answered to the patient's satisfaction.     Lorine Bears

## 2020-01-24 NOTE — ED Provider Notes (Signed)
[facility] Emergency Department Provider Note  ____________________________________________   First MD Initiated Contact with Patient 01/24/20 [phone]     (approximate)  I have reviewed the triage vital signs and the nursing notes.   HISTORY  Chief Complaint Chest Pain    HPI Logan Crawford is a 44 y.o. male with medical history as listed below who presents for evaluation of acute onset and severe sharp and pressure-like pain in his chest.  This started about an hour prior to his arrival in the emergency department.  It was accompanied with shortness of breath and some sweating.  He feels better now and was given a full dose aspirin by the paramedics on route to the hospital.  He said that he has had episodes of chest pain for at least the last 2 weeks but they were milder than this and went away on their own in a shorter period of time.  He smokes and has high blood pressure as well as a family history of heart attacks.  He had COVID-19 approximately 2 months ago and has recovered although he said he never got back all of his energy.  When he was not having the severe chest pain, he was not having any shortness of breath, cough, or other respiratory symptoms.  He denies headache, sore throat, abdominal pain, and dysuria.  He is not having any body aches, fever, nor chills.        Past Medical History:  Diagnosis Date  . Hyperlipidemia   . Hypertension   . Tobacco use 11/25/2019    Patient Active Problem List   Diagnosis Date Noted  . NSTEMI (non-ST elevated myocardial infarction) ([place]) 01/24/2020  . Tobacco use 11/25/2019  . COVID-19 virus infection 11/25/2019  . Abnormal EKG 11/25/2019  . 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 Penile lesion 11/25/2019  . Hypertension   . Hyperlipidemia   . Overweight with body mass index (BMI) of 27 to 27.9 in adult     Past Surgical History:  Procedure Laterality Date  . IRRIGATION AND DEBRIDEMENT KNEE  Left   . TYMPANOSTOMY TUBE PLACEMENT Bilateral     Prior to Admission medications   Medication Sig Start Date End Date Taking? Authorizing Provider  albuterol (VENTOLIN HFA) 108 (90 Base) MCG/ACT inhaler Inhale 2 puffs into the lungs every 6 (six) hours as needed for wheezing or shortness of breath. 11/27/19 02/25/20  [place] Donnamarie Poag, DO  amLODipine (NORVASC) 10 MG tablet Take 1 tablet (10 mg total) by mouth daily. 11/27/19 02/25/20  [place] Donnamarie Poag, DO  atorvastatin (LIPITOR) 40 MG tablet Take 1 tablet (40 mg total) by mouth daily. 11/27/19 02/25/20  [place] Donnamarie Poag, DO  lisinopril (ZESTRIL) 20 MG tablet Take 1 tablet (20 mg total) by mouth daily. 11/27/19 02/25/20  [place] Donnamarie Poag, DO  pantoprazole (PROTONIX) 40 MG tablet Take 1 tablet (40 mg total) by mouth daily. 11/27/19 02/25/20  [place] Donnamarie Poag, DO  sertraline (ZOLOFT) 50 MG tablet Take 50 mg by mouth daily.    [provider]    Allergies Patient has no known allergies.  Family History  Problem Relation Age of Onset  . Coronary artery disease Mother   . Arrhythmia Mother        Pacemaker placed in 2021  . Coronary artery disease Father     Social History Social History   Tobacco Use  . Smoking status: Current Some Day Smoker  . Smokeless tobacco: Never Used  Vaping  Use  . Vaping Use: Never used  Substance Use Topics  . Alcohol use: Yes    Comment: rarely  . Drug use: No    Review of Systems Constitutional: No fever/chills Eyes: No visual changes. ENT: No sore throat. Cardiovascular: +chest pain. Respiratory: Shortness of breath associated with the chest pain Gastrointestinal: No abdominal pain.  No nausea, no vomiting.  No diarrhea.  No constipation. Genitourinary: Negative for dysuria. Musculoskeletal: Negative for neck pain.  Negative for back pain. Integumentary: Negative for rash. Neurological: Negative for headaches, focal weakness or numbness.   ____________________________________________   PHYSICAL  EXAM:  VITAL SIGNS: ED Triage Vitals [01/23/20 2322]  Enc Vitals Group     BP (!) 143/89     Pulse Rate (!) 110     Resp 18     Temp 98 F (36.7 C)     Temp Source Oral     SpO2 97 %     Weight 93 kg (205 lb)     Height 1.829 m (6')     Head Circumference      Peak Flow      Pain Score 0     Pain Loc      Pain Edu?      Excl. in [place]?     Constitutional: Alert and oriented.  Eyes: Conjunctivae are normal.  Head: Atraumatic. Nose: No congestion/rhinnorhea. Mouth/Throat: Patient is wearing a mask. Neck: No stridor.  No meningeal signs.   Cardiovascular: Normal rate, regular rhythm. Good peripheral circulation. Grossly normal heart sounds. Respiratory: Normal respiratory effort.  No retractions. Gastrointestinal: Soft and nontender. No distention.  Musculoskeletal: No lower extremity tenderness nor edema. No gross deformities of extremities. Neurologic:  Normal speech and language. No gross focal neurologic deficits are appreciated.  Skin:  Skin is warm, dry and intact. Psychiatric: Mood and affect are normal. Speech and behavior are normal.  ____________________________________________   LABS (all labs ordered are listed, but only abnormal results are displayed)  Labs Reviewed  CBC WITH DIFFERENTIAL/PLATELET - Abnormal; Notable for the following components:      Result Value   Eosinophils Absolute 0.6 (*)    All other components within normal limits  COMPREHENSIVE METABOLIC PANEL - Abnormal; Notable for the following components:   Glucose, Bld 122 (*)    Creatinine, Ser 1.39 (*)    All other components within normal limits  TROPONIN I (HIGH SENSITIVITY) - Abnormal; Notable for the following components:   Troponin I (High Sensitivity) 3,520 (*)    All other components within normal limits  TROPONIN I (HIGH SENSITIVITY) - Abnormal; Notable for the following components:   Troponin I (High Sensitivity) 2,990 (*)    All other components within normal limits  PROTIME-INR   APTT  HEPARIN LEVEL (UNFRACTIONATED)  BASIC METABOLIC PANEL  LIPID PANEL  CBC   ____________________________________________  EKG  ED ECG REPORT I, Hinda Kehr, the attending physician, personally viewed and interpreted this ECG.  Date: 01/23/2020 EKG Time: 23: 17 Rate: 112 Rhythm: Sinus tachycardia QRS Axis: normal Intervals: normal ST/T Wave abnormalities: Patient has some anteroseptal ST segment elevation but not enough to qualify as a STEMI with deep T waves in leads V4 and V5. Narrative Interpretation: Concerning for ischemia but does not meet STEMI criteria.    ED ECG REPORT I, Hinda Kehr, the attending physician, personally viewed and interpreted this ECG.  Date: 01/24/2020 EKG Time: 1:03 AM Rate: 88 Rhythm: normal sinus rhythm QRS Axis: Right axis deviation Intervals: normal ST/T  Wave abnormalities: Non-specific ST segment / T-wave changes, but no clear evidence of acute ischemia. Narrative Interpretation: no definitive evidence of acute ischemia; does not meet STEMI criteria.  Improved from original EKG.   ____________________________________________  RADIOLOGY Ursula Alert, personally viewed and evaluated these images (plain radiographs) as part of my medical decision making, as well as reviewing the written report by the radiologist.  ED MD interpretation:  No acute abnormalities  Official radiology report(s): DG Chest 2 View  Result Date: 01/23/2020 CLINICAL DATA:  Chest pain EXAM: CHEST - 2 VIEW COMPARISON:  11/25/2019 FINDINGS: The heart size and mediastinal contours are within normal limits. Both lungs are clear. The visualized skeletal structures are unremarkable. IMPRESSION: No active cardiopulmonary disease. Electronically Signed   By: Fidela Salisbury MD   On: 01/23/2020 23:40    ____________________________________________   PROCEDURES   Procedure(s) performed (including Critical Care):  .Critical Care Performed by: Hinda Kehr,  MD Authorized by: Hinda Kehr, MD   Critical care provider statement:    Critical care time (minutes):  45   Critical care time was exclusive of:  Separately billable procedures and treating other patients   Critical care was necessary to treat or prevent imminent or life-threatening deterioration of the following conditions:  Cardiac failure   Critical care was time spent personally by me on the following activities:  Development of treatment plan with patient or surrogate, discussions with consultants, evaluation of patient's response to treatment, examination of patient, obtaining history from patient or surrogate, ordering and performing treatments and interventions, ordering and review of laboratory studies, ordering and review of radiographic studies, pulse oximetry, re-evaluation of patient's condition and review of old charts .1-3 Lead EKG Interpretation Performed by: Hinda Kehr, MD Authorized by: Hinda Kehr, MD     Interpretation: normal     ECG rate:  86   ECG rate assessment: normal     Rhythm: sinus rhythm     Ectopy: none     Conduction: normal       ____________________________________________   INITIAL IMPRESSION / MDM / ASSESSMENT AND PLAN / ED COURSE  As part of my medical decision making, I reviewed the following data within the electronic MEDICAL RECORD NUMBER History obtained from family, Nursing notes reviewed and incorporated, Labs reviewed , EKG interpreted , Old chart reviewed, Discussed with admitting physician (Dr. Damita Dunnings), Discussed with cardiologist (Dr. Fletcher Anon) and reviewed Notes from prior ED visits.   Differential diagnosis includes, but is not limited to, ACS, PE, acute aortic syndrome.  Vital signs are stable.  The patient was asymptomatic with no chest pain upon arrival after receiving a full dose of aspirin by EMS.  I did not receive report about whether or not he got nitroglycerin.  He is currently calm and denying pain.  His initial EKG was  concerning for ischemia but I did not felt it met STEMI criteria.  A repeat EKG just under 2 hours later looked much improved.  Initial troponin was about 3500.  I started him on heparin per protocol with a bolus and infusion.  I talked with him and his wife about his history and the current diagnosis of NSTEMI and they understand and agree with the plan for admission and cardiology consultation later this morning.  The patient had COVID-19 about 2 months ago and thus as per hospital protocol he should not be tested again given that he is asymptomatic from a Covid perspective.  Given his dynamic EKG changes and history, I discussed  the case by phone with Dr. Fletcher Anon with cardiology who reviewed the EKGs and agreed with the plan for medical management rather than emergent catheterization although the patient will likely receive urgent catheterization in the morning.  The patient is on the cardiac monitor to evaluate for evidence of arrhythmia and/or significant heart rate changes.  Repeat troponin is pending.     Clinical Course as of Jan 23 250  Tue Jan 24, 2020  0203 Discussed case in person with Dr. Damita Dunnings with the hospitalist service who will admit.   [CF]  0234 Troponin is trending down.  Troponin I (High Sensitivity)(!!): 2,990 [CF]    Clinical Course User Index [CF] Hinda Kehr, MD     ____________________________________________  FINAL CLINICAL IMPRESSION(S) / ED DIAGNOSES  Final diagnoses:  NSTEMI (non-ST elevated myocardial infarction) ([place])     MEDICATIONS GIVEN DURING THIS VISIT:  Medications  heparin ADULT infusion 100 units/mL (25000 units/247m sodium chloride 0.45%) (1,300 Units/hr Intravenous New Bag/Given 01/24/20 0136)  pantoprazole (PROTONIX) EC tablet 40 mg (has no administration in time range)  amLODipine (NORVASC) tablet 10 mg (has no administration in time range)  atorvastatin (LIPITOR) tablet 40 mg (has no administration in time range)  aspirin EC tablet 81  mg (has no administration in time range)  nitroGLYCERIN (NITROSTAT) SL tablet 0.4 mg (has no administration in time range)  acetaminophen (TYLENOL) tablet 650 mg (has no administration in time range)  ondansetron (ZOFRAN) injection 4 mg (has no administration in time range)  metoprolol tartrate (LOPRESSOR) tablet 12.5 mg (has no administration in time range)  aspirin chewable tablet 324 mg (324 mg Oral Given by EMS 01/24/20 0125)  heparin bolus via infusion 4,000 Units (4,000 Units Intravenous Bolus from Bag 01/24/20 0137)     ED Discharge Orders    None      *Please note:  JMARCIN HOLTEwas evaluated in Emergency Department on 01/24/2020 for the symptoms described in the history of present illness. He was evaluated in the context of the global COVID-19 pandemic, which necessitated consideration that the patient might be at risk for infection with the SARS-CoV-2 virus that causes COVID-19. Institutional protocols and algorithms that pertain to the evaluation of patients at risk for COVID-19 are in a state of rapid change based on information released by regulatory bodies including the CDC and federal and state organizations. These policies and algorithms were followed during the patient's care in the ED.  Some ED evaluations and interventions may be delayed as a result of limited staffing during and after the pandemic.*  Note:  This document was prepared using Dragon voice recognition software and may include unintentional dictation errors.   FHinda Kehr MD 01/24/20 [phone]

## 2020-01-24 NOTE — H&P (Signed)
History and Physical    Logan Crawford QMG:[id number] DOB: October 24, 1975 DOA: 01/24/2020  PCP: Pcp, No   Patient coming from: Home  I have personally briefly reviewed patient's old medical records in [facility]  Chief Complaint: Chest pain  HPI: Logan Crawford is a 44 y.o. 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  Describes it as tightness radiating to the left arm with associated shortness of breath but no nausea vomiting or diaphoresis.  Since this hospitalization in October he has cut back on his tobacco from a pack a day to about less than half pack a day.  Patient said positive for Covid during his hospitalization but asymptomatic and was treated with monoclonal antibodies. ED Course: On arrival patient was chest pain-free.  He was afebrile, BP 143/89 slightly tachycardic at 110 with O2 sat 97% on room air.   Troponin 3520 >>2990 EKG as reviewed by me : ST depression D2-D3 aVF, T wave inversion anterolateral leads. Chest x-ray clear The emergency room provider, Dr. York Cerise spoke with on-call Dr. Kirke Corin who reviewed EKG. will see patient in the a.m.  Patient started on heparin infusion.  Hospitalist consulted for admission.  Review of Systems: As per HPI otherwise all other systems on review of systems negative.    Past Medical History:  Diagnosis Date  . Hyperlipidemia   . Hypertension   . Tobacco use 11/25/2019    Past Surgical History:  Procedure Laterality Date  . IRRIGATION AND DEBRIDEMENT KNEE Left   . TYMPANOSTOMY TUBE PLACEMENT Bilateral      reports that he has been smoking. He has never used smokeless  tobacco. He reports current alcohol use. He reports that he does not use drugs.  No Known Allergies  Family History  Problem Relation Age of Onset  . Coronary artery disease Mother   . Arrhythmia Mother        Pacemaker placed in 2021  . Coronary artery disease Father       Prior to Admission medications   Medication Sig Start Date End Date Taking? Authorizing Provider  albuterol (VENTOLIN HFA) 108 (90 Base) MCG/ACT inhaler Inhale 2 puffs into the lungs every 6 (six) hours as needed for wheezing or shortness of breath. 11/27/19 02/25/20  [place], Alvira Philips, DO  amLODipine (NORVASC) 10 MG tablet Take 1 tablet (10 mg total) by mouth daily. 11/27/19 02/25/20  [place], Alvira Philips, DO  atorvastatin (LIPITOR) 40 MG tablet Take 1 tablet (40 mg total) by mouth daily. 11/27/19 02/25/20  [place], Alvira Philips, DO  lisinopril (ZESTRIL) 20 MG tablet Take 1 tablet (20 mg total) by mouth daily. 11/27/19 02/25/20  [place], Alvira Philips, DO  pantoprazole (PROTONIX) 40 MG tablet Take 1 tablet (40 mg total) by mouth daily. 11/27/19 02/25/20  [place], Alvira Philips, DO  sertraline (ZOLOFT) 50 MG tablet Take 50 mg by mouth daily.    [provider]    Physical Exam: Vitals:   01/23/20 2322 01/24/20 0058 01/24/20 0242  BP: (!) 143/89 126/85 102/81  Pulse: (!) 110 88 84  Resp: 18 17 13   Temp: 98 F (36.7 C)    TempSrc: Oral  SpO2: 97% 100% 97%  Weight: 93 kg    Height: 6' (1.829 m)       Vitals:   01/23/20 2322 01/24/20 0058 01/24/20 0242  BP: (!) 143/89 126/85 102/81  Pulse: (!) 110 88 84  Resp: 18 17 13   Temp: 98 F (36.7 C)    TempSrc: Oral    SpO2: 97% 100% 97%  Weight: 93 kg    Height: 6' (1.829 m)        Constitutional: Alert and oriented x 3 . Not in any apparent distress HEENT:      Head: Normocephalic and atraumatic.         Eyes: PERLA, EOMI, Conjunctivae are normal. Sclera is non-icteric.       Mouth/Throat: Mucous membranes are moist.       Neck: Supple with no signs of  meningismus. Cardiovascular: Regular rate and rhythm. No murmurs, gallops, or rubs. 2+ symmetrical distal pulses are present . No JVD. No LE edema Respiratory: Respiratory effort normal .Lungs sounds clear bilaterally. No wheezes, crackles, or rhonchi.  Gastrointestinal: Soft, non tender, and non distended with positive bowel sounds. No rebound or guarding. Genitourinary: No CVA tenderness. Musculoskeletal: Nontender with normal range of motion in all extremities. No cyanosis, or erythema of extremities. Neurologic:  Face is symmetric. Moving all extremities. No gross focal neurologic deficits . Skin: Skin is warm, dry.  No rash or ulcers Psychiatric: Mood and affect are normal    Labs on Admission: I have personally reviewed following labs and imaging studies  CBC: Recent Labs  Lab 01/23/20 2325  WBC 8.7  NEUTROABS 4.8  HGB 15.4  HCT 45.1  MCV 87.4  PLT 361   Basic Metabolic Panel: Recent Labs  Lab 01/23/20 2325  NA 138  K 3.9  CL 107  CO2 23  GLUCOSE 122*  BUN 17  CREATININE 1.39*  CALCIUM 8.9   GFR: Estimated Creatinine Clearance: 72.9 mL/min (A) (by C-G formula based on SCr of 1.39 mg/dL (H)). Liver Function Tests: Recent Labs  Lab 01/23/20 2325  AST 37  ALT 31  ALKPHOS 85  BILITOT 0.4  PROT 7.7  ALBUMIN 4.0   No results for input(s): LIPASE, AMYLASE in the last 168 hours. No results for input(s): AMMONIA in the last 168 hours. Coagulation Profile: Recent Labs  Lab 01/24/20 0105  INR 0.9   Cardiac Enzymes: No results for input(s): CKTOTAL, CKMB, CKMBINDEX, TROPONINI in the last 168 hours. BNP (last 3 results) No results for input(s): PROBNP in the last 8760 hours. HbA1C: No results for input(s): HGBA1C in the last 72 hours. CBG: No results for input(s): GLUCAP in the last 168 hours. Lipid Profile: No results for input(s): CHOL, HDL, LDLCALC, TRIG, CHOLHDL, LDLDIRECT in the last 72 hours. Thyroid Function Tests: No results for input(s): TSH,  T4TOTAL, FREET4, T3FREE, THYROIDAB in the last 72 hours. Anemia Panel: No results for input(s): VITAMINB12, FOLATE, FERRITIN, TIBC, IRON, RETICCTPCT in the last 72 hours. Urine analysis:    Component Value Date/Time   COLORURINE YELLOW (A) 11/25/2019 1759   APPEARANCEUR CLEAR (A) 11/25/2019 1759   APPEARANCEUR Clear 06/30/2013 1513   LABSPEC 1.018 11/25/2019 1759   LABSPEC 1.015 06/30/2013 1513   PHURINE 5.0 11/25/2019 1759   GLUCOSEU NEGATIVE 11/25/2019 1759   GLUCOSEU Negative 06/30/2013 1513   HGBUR SMALL (A) 11/25/2019 1759   BILIRUBINUR NEGATIVE 11/25/2019 1759   BILIRUBINUR Negative 06/30/2013 1513   KETONESUR NEGATIVE 11/25/2019 1759   PROTEINUR 30 (A) 11/25/2019 1759  NITRITE NEGATIVE 11/25/2019 1759   LEUKOCYTESUR NEGATIVE 11/25/2019 1759   LEUKOCYTESUR Negative 06/30/2013 1513    Radiological Exams on Admission: DG Chest 2 View  Result Date: 01/23/2020 CLINICAL DATA:  Chest pain EXAM: CHEST - 2 VIEW COMPARISON:  11/25/2019 FINDINGS: The heart size and mediastinal contours are within normal limits. Both lungs are clear. The visualized skeletal structures are unremarkable. IMPRESSION: No active cardiopulmonary disease. 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  Hospitalized in October for same.    NSTEMI (non-ST elevated myocardial infarction) (HCC) -Patient with multiple risk factors including HTN, HLD, tobacco use and family history -Acute ST-T wave changes on EKG, reviewed by Dr. Kirke Corin -Troponin in the 3000s -Continue heparin infusion, antiplatelets and statins -Consult cardiology    Tobacco use -Patient cutting back.  States since he was hospitalized in October went down from a pack a day to less than half pack a day -Does not want nicotine patch -Continued encouragement     Hypertension -Continue home meds    Hyperlipidemia -Continue atorvastatin    DVT prophylaxis: Full dose heparin Code Status: full code  Family Communication:  none  Disposition Plan: Back to previous home environment Consults called: Dr. Kirke Corin Status:At the time of admission, it appears that the appropriate admission status for this patient is INPATIENT. This is judged to be reasonable and necessary in order to provide the required intensity of service to ensure the patient's safety given the presenting symptoms, physical exam findings, and initial radiographic and laboratory data in the context of their  Comorbid conditions.   Patient requires inpatient status due to high intensity of service, high risk for further deterioration and high frequency of surveillance required.   I certify that at the point of admission it is my clinical judgment that the patient will require inpatient hospital care spanning beyond 2 midnights     Andris Baumann MD [facility]     01/24/2020, 2:49 AM

## 2020-01-24 NOTE — Progress Notes (Signed)
ANTICOAGULATION CONSULT NOTE - Initial Consult  Pharmacy Consult for Heparin  Indication: chest pain/ACS  No Known Allergies  Patient Measurements: Height: 6' (182.9 cm) Weight: 93 kg (205 lb) IBW/kg (Calculated) : 77.6 Heparin Dosing Weight: 93 kg   Vital Signs: Temp: 98 F (36.7 C) (11/29 2322) Temp Source: Oral (11/29 2322) BP: 126/85 (11/30 0058) Pulse Rate: 88 (11/30 0058)  Labs: Recent Labs    01/23/20 2325  HGB 15.4  HCT 45.1  PLT 361  CREATININE 1.39*  TROPONINIHS 3,520*    Estimated Creatinine Clearance: 72.9 mL/min (A) (by C-G formula based on SCr of 1.39 mg/dL (H)).   Medical History: Past Medical History:  Diagnosis Date  . Hyperlipidemia   . Hypertension   . Tobacco use 11/25/2019    Medications:  (Not in a hospital admission)   Assessment: Pharmacy consulted dose heparin in this 44 year old male admitted with ACS/NSTEMI.  No prior anticoag noted.  CrCl = 72.9 ml/min  Goal of Therapy:  Heparin level 0.3-0.7 units/ml Monitor platelets by anticoagulation protocol: Yes   Plan:  Give 4000 units bolus x 1 Start heparin infusion at 1300 units/hr Check anti-Xa level in 6 hours and daily while on heparin Continue to monitor H&H and platelets  Logan Crawford D 01/24/2020,1:21 AM

## 2020-01-24 NOTE — ED Notes (Signed)
Heparin rate changed with Paulino Rily

## 2020-01-24 NOTE — Consult Note (Addendum)
Cardiology Consultation:   Patient ID: Logan Crawford MRN: [id number]; DOB: August 01, 1975  Admit date: 01/24/2020 Date of Consult: 01/24/2020  Primary Care Provider: Oneita Hurt No [facility] Cardiologist: Debbe Odea, MD  [facility] Electrophysiologist:  None    Patient Profile:   EIN Logan Crawford is a 44 y.o. male with a hx of hypertension, hyperlipidemia, asthma, previous COVID-19 infection, and current tobacco use who is being seen today for the evaluation of non-ST elevation myocardial infarction at the request of Dr. Jacqulyn Bath.  History of Present Illness:   Logan Crawford is a 44 year old male with PMH as above.  He reports a family history of CAD with mother having stents placed and PPM placed recently.  He denies any family history of early cardiac death to his knowledge.  He is a current smoker and has recently cut back from 1 pack/day to 1 pack every 3 days.  He reports occasional alcohol use with 1 -2 beers every 1 to 2 weeks.  He reports that back in October 2021 he had COVID-19.  Echo performed at that time showed EF 50 to 55%, G1DD, mild aortic sclerosis without stenosis.  He was reportedly then diagnosed with high blood pressure and started on blood pressure medication (amlodipine 10mg  daily, lisinopril 20mg  daily) that he unfortunately ran out of and did not refill.  On review of EMR, he was also started on Lipitor 40 mg daily.  He did not follow-up with a cardiologist, was lost to follow-up.  Approximately 2 weeks ago, he reports having episodes of chest pain that started both with exertion and at rest.  Initially, these episodes lasted approximately 5 minutes.  They are further characterized as a generalized precordial/anterior chest feeling of acid reflux that would self resolve.  Yesterday, he was bringing groceries in from outside when he had another episode of chest discomfort and eventually went away.  He then had an additional episode while lying in bed that lasted for  1 hour.  He attempted to take Tums without relief.  He further describes the CP as a severe reflux that was nonpositional, nonradiating, not tender to palpation, and not pleuritic in nature.  Associated symptoms included malaise/fatigue and shortness of breath. He reports some diaphoresis but also states this may have been related to the level of pain. He reports that the fatigue had been persistent for some time when thinking on it today and possibly since his COVID-19 infection.    He decided to present to the [facility] emergency department.   In the [facility] ED, initial vitals significant for 143/89 with heart rate 110 bpm and 97% on room air.  Labs significant for potassium 3.8, creatinine 1.39  1.33, BUN 17   18, high-sensitivity troponin 3520.0  2990.0. H&H stable.  Chest x-ray without active disease.Initial EKG NSR, 88 bpm, ischemic changes noted in the anterolateral and septal leads with TWI most notable in I, avL, V1-V6.  At the time of evaluation, he was CP free but reported an episode of CP that had resolved approximately 5 minutes before my entry into the room.   Past Medical History:  Diagnosis Date  . Hyperlipidemia   . Hypertension   . Tobacco use 11/25/2019    Past Surgical History:  Procedure Laterality Date  . IRRIGATION AND DEBRIDEMENT KNEE Left   . TYMPANOSTOMY TUBE PLACEMENT Bilateral      Home Medications:  Prior to Admission medications   Medication Sig Start Date End Date Taking? Authorizing Provider  albuterol (VENTOLIN HFA)  108 (90 Base) MCG/ACT inhaler Inhale 2 puffs into the lungs every 6 (six) hours as needed for wheezing or shortness of breath. 11/27/19 02/25/20 Yes [place], Eric J, DO  amLODipine (NORVASC) 10 MG tablet Take 1 tablet (10 mg total) by mouth daily. 11/27/19 02/25/20 Yes [place], Eric J, DO  atorvastatin (LIPITOR) 40 MG tablet Take 1 tablet (40 mg total) by mouth daily. 11/27/19 02/25/20 Yes [place], Eric J, DO  lisinopril (ZESTRIL) 20 MG tablet Take 1 tablet (20  mg total) by mouth daily. 11/27/19 02/25/20 Yes [place], Eric J, DO  pantoprazole (PROTONIX) 40 MG tablet Take 1 tablet (40 mg total) by mouth daily. 11/27/19 02/25/20 Yes [place], Eric J, DO  sertraline (ZOLOFT) 50 MG tablet Take 50 mg by mouth daily.    [provider]    Inpatient Medications: Scheduled Meds: . amLODipine  10 mg Oral Daily  . [START ON 01/25/2020] aspirin EC  81 mg Oral Daily  . atorvastatin  40 mg Oral Daily  . metoprolol tartrate  12.5 mg Oral BID  . pantoprazole  40 mg Oral Daily   Continuous Infusions: . heparin 1,500 Units/hr (01/24/20 0832)   PRN Meds: acetaminophen, nitroGLYCERIN, ondansetron (ZOFRAN) IV  Allergies:   No Known Allergies  Social History:   Social History   Socioeconomic History  . Marital status: Single    Spouse name: Not on file  . Number of children: Not on file  . Years of education: Not on file  . Highest education level: Not on file  Occupational History  . Not on file  Tobacco Use  . Smoking status: Current Some Day Smoker  . Smokeless tobacco: Never Used  Vaping Use  . Vaping Use: Never used  Substance and Sexual Activity  . Alcohol use: Yes    Comment: rarely  . Drug use: No  . Sexual activity: Not on file  Other Topics Concern  . Not on file  Social History Narrative  . Not on file   Social Determinants of Health   Financial Resource Strain:   . Difficulty of Paying Living Expenses: Not on file  Food Insecurity:   . Worried About Running Out of Food in the Last Year: Not on file  . Ran Out of Food in the Last Year: Not on file  Transportation Needs:   . Lack of Transportation (Medical): Not on file  . Lack of Transportation (Non-Medical): Not on file  Physical Activity:   . Days of Exercise per Week: Not on file  . Minutes of Exercise per Session: Not on file  Stress:   . Feeling of Stress : Not on file  Social Connections:   . Frequency of Communication with Friends and Family: Not on file  .  Frequency of Social Gatherings with Friends and Family: Not on file  . Attends Religious Services: Not on file  . Active Member of Clubs or Organizations: Not on file  . Attends Club or Organization Meetings: Not on file  . Marital Status: Not on file  Intimate Partner Violence:   . Fear of Current or Ex-Partner: Not on file  . Emotionally Abused: Not on file  . Physically Abused: Not on file  . Sexually Abused: Not on file    Family History:    Family History  Problem Relation Age of Onset  . Coronary artery disease Mother   . Arrhythmia Mother        Pacemaker placed in 2021  . Coronary artery disease Father        ROS:  Please see the history of present illness.  Review of Systems  Constitutional: Positive for malaise/fatigue.  Respiratory: Positive for shortness of breath. Negative for hemoptysis.   Cardiovascular: Positive for chest pain. Negative for palpitations, orthopnea and leg swelling.  Gastrointestinal: Positive for heartburn. Negative for blood in stool, melena and vomiting.  Genitourinary: Negative for hematuria.  Musculoskeletal: Negative for falls.  Neurological: Negative for dizziness and loss of consciousness.  All other systems reviewed and are negative.   All other ROS reviewed and negative.     Physical Exam/Data:   Vitals:   01/24/20 0600 01/24/20 0700 01/24/20 0730 01/24/20 0830  BP: 132/72 121/79 111/86 116/79  Pulse: 74 77 82 72  Resp: 19 14 (!) 21 11  Temp:      TempSrc:      SpO2: 96% 99% 99% 99%  Weight:      Height:       No intake or output data in the 24 hours ending 01/24/20 0840 Last 3 Weights 01/23/2020 11/27/2019 11/25/2019  Weight (lbs) 205 lb 200 lb 9.9 oz 200 lb  Weight (kg) 92.987 kg 91 kg 90.719 kg     Body mass index is 27.8 kg/m.  General:  Well nourished, well developed, in no acute distress HEENT: normal Lymph: no adenopathy Neck: no JVD Endocrine:  No thryomegaly Vascular: No carotid bruits; FA pulses 2+ bilaterally  without bruits  Cardiac:  normal S1, S2; RRR; no murmur  Lungs:  clear to auscultation bilaterally, no wheezing, rhonchi or rales  Abd: soft, nontender, no hepatomegaly  Ext: no edema Musculoskeletal:  No deformities, BUE and BLE strength normal and equal Skin: warm and dry  Neuro:  CNs 2-12 intact, no focal abnormalities noted Psych:  Normal affect   EKG:  The EKG was personally reviewed and demonstrates:  NSR, 88 bpm, ischemic changes noted in the anterolateral and septal leads with TWI most notable in I, avL, V1-V6.  Telemetry:  Telemetry was personally reviewed and demonstrates: NSR, 70s to 90s, PVCs  Relevant CV Studies: Echo 11/2019 1. Left ventricular ejection fraction, by estimation, is 50 to 55%. The  left ventricle has low normal function. The left ventricle has no regional  wall motion abnormalities. Left ventricular diastolic parameters are  consistent with Grade I diastolic  dysfunction (impaired relaxation).  2. Right ventricular systolic function is normal. The right ventricular  size is normal.  3. The mitral valve is normal in structure. No evidence of mitral valve  regurgitation.  4. The aortic valve is calcified. Aortic valve regurgitation is not  visualized. Mild aortic valve sclerosis is present, with no evidence of  aortic valve stenosis.   Laboratory Data:  High Sensitivity Troponin:   Recent Labs  Lab 01/23/20 2325 01/24/20 0105  TROPONINIHS 3,520* 2,990*     Chemistry Recent Labs  Lab 01/23/20 2325 01/24/20 0359  NA 138 139  K 3.9 3.8  CL 107 106  CO2 23 23  GLUCOSE 122* 127*  BUN 17 18  CREATININE 1.39* 1.33*  CALCIUM 8.9 8.9  GFRNONAA >60 >60  ANIONGAP 8 10    Recent Labs  Lab 01/23/20 2325  PROT 7.7  ALBUMIN 4.0  AST 37  ALT 31  ALKPHOS 85  BILITOT 0.4   Hematology Recent Labs  Lab 01/23/20 2325 01/24/20 0359  WBC 8.7 10.1  RBC 5.16 4.63  HGB 15.4 14.2  HCT 45.1 40.1  MCV 87.4 86.6  MCH 29.8 30.7  MCHC 34.1  35.4  RDW 13.0 13.2  PLT 361 324   BNPNo results for input(s): BNP, PROBNP in the last 168 hours.  DDimer No results for input(s): DDIMER in the last 168 hours.   Radiology/Studies:  DG Chest 2 View  Result Date: 01/23/2020 CLINICAL DATA:  Chest pain EXAM: CHEST - 2 VIEW COMPARISON:  11/25/2019 FINDINGS: The heart size and mediastinal contours are within normal limits. Both lungs are clear. The visualized skeletal structures are unremarkable. IMPRESSION: No active cardiopulmonary disease. Electronically Signed   By: Helyn Numbers MD   On: 01/23/2020 23:40     Assessment and Plan:   1. Non STEMI: No current chest pain, though he continues to have episodes of chest pain that occur both with exertion and at rest with longest episode lasting 1 hour, leading to his presentation to [facility] ED.  Episode started approximately 2 weeks ago.  Risk factors for cardiac etiology include family history, hypertension, hyperlipidemia, and current tobacco use.  High-sensitivity troponin elevated with peak 3520.0.  EKG with diffuse T wave inversion and changes noted in the septal and anterolateral leads.    Presentation consistent with ACS as above.  Further ischemic workup needed with Echo and LHC.  Echo ordered and pending official results.   LHC tentatively planned with Dr. Kirke Corin for later this afternoon. Risks and benefits of cardiac catheterization have been discussed with the patient.  These include bleeding, infection, kidney damage, stroke, heart attack, death.  The patient understands these risks and is willing to proceed.  NPO in preparation for LHC today 01/24/20.  IV heparin.   Daily CBC, BMET.  Continue medical management with ASA 81mg , metoprolol, atorvastatin, and as needed sublingual nitro for chest pain.    After catheterization, consider escalation of atorvastatin to 80 mg daily for risk factor modification / high intensity statin, consolidation of metoprolol to Toprol-XL, and  transition from amlodipine to losartan for BP control.   Further recommendations s/p echo and LHC.   2. Hypertension  Current BP well controlled at 111/86.  Goal BP 130/80 or lower. As above, following LHC, consider transition from amlodipine to losartan to avoid pt being on both BB and CCB. Continue BB with consolidation to Toprol before discharge. Will defer changes for now and until after LHC.  3. HLD, goal LDL <70  Total cholesterol 173 with LDL 83.  Goal LDL below 70.  Continue atorvastatin. As above, recommend increased to atorvastatin 80 mg daily after LHC for aggressive risk factor modification.  Repeat lipid and liver function in 6 to 8 weeks as an outpatient.  4. Tobacco use, current  Smoking cessation advised. Discussed his triggers today and stressed the importance of smoking cessation from a cardiovascular perspective and given the benefits of smoking cessation on his overall health.  He is in agreement and motivated to quit smoking.  Continue nicotine patch as needed.  5. CKD  Cr appears at his baseline on review of labs. Daily BMET to monitor. Most recent Cr 1.39  1.33 with BUN 18 as above.   6. Prediabetes  A1C 10/2 was 5.7. Per IM.         TIMI Risk Score for Unstable Angina or Non-ST Elevation MI:   The patient's TIMI risk score is 5, which indicates a 26% risk of all cause mortality, new or recurrent myocardial infarction or need for urgent revascularization in the next 14 days.           For questions or updates, please contact [facility]  Please consult [email or web address] for contact info under    Signed, Lennon AlstromJacquelyn D Bentley Haralson, PA-C  01/24/2020 8:40 AM

## 2020-01-24 NOTE — Plan of Care (Signed)

## 2020-01-24 NOTE — Progress Notes (Signed)
ANTICOAGULATION CONSULT NOTE   Pharmacy Consult for Heparin  Indication: chest pain/ACS  No Known Allergies  Patient Measurements: Height: 6' (182.9 cm) Weight: 93 kg (205 lb) IBW/kg (Calculated) : 77.6 Heparin Dosing Weight: 93 kg   Vital Signs: Temp: 98 F (36.7 C) (11/29 2322) Temp Source: Oral (11/29 2322) BP: 132/72 (11/30 0600) Pulse Rate: 74 (11/30 0600)  Labs: Recent Labs    01/23/20 2325 01/24/20 0105 01/24/20 0359 01/24/20 0725  HGB 15.4  --  14.2  --   HCT 45.1  --  40.1  --   PLT 361  --  324  --   APTT  --  29  --   --   LABPROT  --  11.9  --   --   INR  --  0.9  --   --   HEPARINUNFRC  --   --   --  0.21*  CREATININE 1.39*  --  1.33*  --   TROPONINIHS 3,520* 2,990*  --   --     Estimated Creatinine Clearance: 76.2 mL/min (A) (by C-G formula based on SCr of 1.33 mg/dL (H)).   Medical History: Past Medical History:  Diagnosis Date  . Hyperlipidemia   . Hypertension   . Tobacco use 11/25/2019    Medications:  (Not in a hospital admission)   Assessment: Pharmacy consulted dose heparin in this 44 year old male admitted with ACS/NSTEMI.  No prior anticoag noted.  CrCl = 72.9 ml/min  Goal of Therapy:  Heparin level 0.3-0.7 units/ml Monitor platelets by anticoagulation protocol: Yes   Plan:  Give 4000 units bolus x 1 Start heparin infusion at 1300 units/hr Check anti-Xa level in 6 hours and daily while on heparin Continue to monitor H&H and platelets   11/30 @0725  HL=0.21 subtherapeutic. Will order heparin bolus of 1400 units x 1 and increase drip to 1500 units/hr. Recheck HL in 6 hours.    Tyrea Froberg A 01/24/2020,8:20 AM

## 2020-01-24 NOTE — ED Notes (Addendum)
RN to bedside to introduce self. Pt states was having "chest tightness" within the last few minutes that has now subsided, denies CP at this time. NAD noted, RR even and unlabored. Dr Jacqulyn Bath messaged via secure chat to notify, no new orders at this time

## 2020-01-25 ENCOUNTER — Encounter: Payer: Self-pay | Admitting: Cardiovascular Disease

## 2020-01-25 DIAGNOSIS — K219 Gastro-esophageal reflux disease without esophagitis: Secondary | ICD-10-CM

## 2020-01-25 DIAGNOSIS — N179 Acute kidney failure, unspecified: Secondary | ICD-10-CM

## 2020-01-25 DIAGNOSIS — E785 Hyperlipidemia, unspecified: Secondary | ICD-10-CM

## 2020-01-25 DIAGNOSIS — Z955 Presence of coronary angioplasty implant and graft: Secondary | ICD-10-CM

## 2020-01-25 LAB — COMPREHENSIVE METABOLIC PANEL
ALT: 26 U/L (ref 0–44)
AST: 24 U/L (ref 15–41)
Albumin: 3.6 g/dL (ref 3.5–5.0)
Alkaline Phosphatase: 73 U/L (ref 38–126)
Anion gap: 11 (ref 5–15)
BUN: 19 mg/dL (ref 6–20)
CO2: 23 mmol/L (ref 22–32)
Calcium: 9 mg/dL (ref 8.9–10.3)
Chloride: 103 mmol/L (ref 98–111)
Creatinine, Ser: 0.97 mg/dL (ref 0.61–1.24)
GFR, Estimated: >60 mL/min (ref >60)
Glucose, Bld: 97 mg/dL (ref 70–99)
Potassium: 4.1 mmol/L (ref 3.5–5.1)
Sodium: 137 mmol/L (ref 135–145)
Total Bilirubin: 0.7 mg/dL (ref 0.3–1.2)
Total Protein: 6.8 g/dL (ref 6.5–8.1)

## 2020-01-25 LAB — CBC
HCT: 41.5 % (ref 39.0–52.0)
Hemoglobin: 14.8 g/dL (ref 13.0–17.0)
MCH: 31.2 pg (ref 26.0–34.0)
MCHC: 35.7 g/dL (ref 30.0–36.0)
MCV: 87.6 fL (ref 80.0–100.0)
Platelets: 319 10*3/uL (ref 150–400)
RBC: 4.74 MIL/uL (ref 4.22–5.81)
RDW: 13.3 % (ref 11.5–15.5)
WBC: 9.4 10*3/uL (ref 4.0–10.5)
nRBC: 0 % (ref 0.0–0.2)

## 2020-01-25 LAB — SARS CORONAVIRUS 2 (TAT 6-24 HRS): SARS Coronavirus 2: NEGATIVE

## 2020-01-25 MED ORDER — ATORVASTATIN CALCIUM 80 MG PO TABS
80.0000 mg | ORAL_TABLET | Freq: Every day | ORAL | Status: DC
  Administered 2020-01-25: 80 mg via ORAL
  Filled 2020-01-25: qty 1

## 2020-01-25 MED ORDER — LISINOPRIL 20 MG PO TABS
20.0000 mg | ORAL_TABLET | Freq: Every day | ORAL | Status: DC
  Administered 2020-01-25: 20 mg via ORAL
  Filled 2020-01-25: qty 1

## 2020-01-25 MED ORDER — PANTOPRAZOLE SODIUM 40 MG PO TBEC: 40.0000 mg | DELAYED_RELEASE_TABLET | Freq: Every day | ORAL | 0 refills | Status: DC

## 2020-01-25 MED ORDER — NITROGLYCERIN 0.4 MG SL SUBL: 0.4000 mg | SUBLINGUAL_TABLET | SUBLINGUAL | 0 refills | Status: DC | PRN

## 2020-01-25 MED ORDER — TICAGRELOR 90 MG PO TABS: 90.0000 mg | ORAL_TABLET | Freq: Two times a day (BID) | ORAL | 0 refills | Status: DC

## 2020-01-25 MED ORDER — IRBESARTAN 150 MG PO TABS: 150.0000 mg | ORAL_TABLET | Freq: Every day | ORAL | Status: DC

## 2020-01-25 MED ORDER — ASPIRIN 81 MG PO TBEC
81.0000 mg | DELAYED_RELEASE_TABLET | Freq: Every day | ORAL | 0 refills | Status: DC
Start: 2020-01-26 — End: 2021-11-19

## 2020-01-25 MED ORDER — ATORVASTATIN CALCIUM 80 MG PO TABS
80.0000 mg | ORAL_TABLET | Freq: Every day | ORAL | 0 refills | Status: DC
Start: 2020-01-26 — End: 2020-01-30

## 2020-01-25 MED ORDER — LISINOPRIL 20 MG PO TABS: 20.0000 mg | ORAL_TABLET | Freq: Every day | ORAL | 0 refills | Status: DC

## 2020-01-25 MED ORDER — CARVEDILOL 6.25 MG PO TABS
6.2500 mg | ORAL_TABLET | Freq: Two times a day (BID) | ORAL | 0 refills | Status: DC
Start: 2020-01-25 — End: 2020-01-30

## 2020-01-25 MED ORDER — SERTRALINE HCL 50 MG PO TABS
50.0000 mg | ORAL_TABLET | Freq: Every day | ORAL | 0 refills | Status: DC
Start: 2020-01-25 — End: 2021-05-21

## 2020-01-25 NOTE — Progress Notes (Signed)
Progress Note  Patient Name: Logan Crawford Date of Encounter: 01/25/2020  [facility] Cardiologist: Debbe Odea, MD   Subjective   Patient feels well this morning, denies chest pain.  Ambulated to bathroom without any symptoms.  Inpatient Medications    Scheduled Meds: . aspirin EC  81 mg Oral Daily  . atorvastatin  80 mg Oral Daily  . carvedilol  6.25 mg Oral BID WC  . enoxaparin (LOVENOX) injection  40 mg Subcutaneous Q24H  . lisinopril  20 mg Oral Daily  . pantoprazole  40 mg Oral Daily  . sodium chloride flush  3 mL Intravenous Q12H  . sodium chloride flush  3 mL Intravenous Q12H  . ticagrelor  90 mg Oral Q12H   Continuous Infusions: . sodium chloride     PRN Meds: sodium chloride, acetaminophen, nitroGLYCERIN, ondansetron (ZOFRAN) IV, sodium chloride flush   Vital Signs    Vitals:   01/24/20 1959 01/25/20 0018 01/25/20 0410 01/25/20 0819  BP: 103/73 127/90 126/84 125/85  Pulse: 69 69 64 75  Resp: 18 18 17    Temp: 97.7 F (36.5 C) 98.1 F (36.7 C) 97.6 F (36.4 C) 97.9 F (36.6 C)  TempSrc: Oral Oral  Oral  SpO2: 99% 100% 96% 100%  Weight:   92.9 kg   Height:        Intake/Output Summary (Last 24 hours) at 01/25/2020 0939 Last data filed at 01/25/2020 0033 Gross per 24 hour  Intake 1260.64 ml  Output 1500 ml  Net -239.36 ml   Last 3 Weights 01/25/2020 01/24/2020 01/23/2020  Weight (lbs) 204 lb 12.8 oz 203 lb 11.2 oz 205 lb  Weight (kg) 92.897 kg 92.398 kg 92.987 kg      Telemetry    Sinus rhythm- Personally Reviewed  ECG    New tracing- Personally Reviewed  Physical Exam   GEN: No acute distress.   Neck: No JVD Cardiac: RRR, no murmurs, rubs, or gallops.  Respiratory: Clear to auscultation bilaterally. GI: Soft, nontender, non-distended  MS: No edema; No deformity. Neuro:  Nonfocal  Psych: Normal affect   Labs    High Sensitivity Troponin:   Recent Labs  Lab 01/23/20 2325 01/24/20 0105  TROPONINIHS 3,520* 2,990*       Chemistry Recent Labs  Lab 01/23/20 2325 01/24/20 0359 01/25/20 0513  NA 138 139 137  K 3.9 3.8 4.1  CL 107 106 103  CO2 23 23 23   GLUCOSE 122* 127* 97  BUN 17 18 19   CREATININE 1.39* 1.33* 0.97  CALCIUM 8.9 8.9 9.0  PROT 7.7  --  6.8  ALBUMIN 4.0  --  3.6  AST 37  --  24  ALT 31  --  26  ALKPHOS 85  --  73  BILITOT 0.4  --  0.7  GFRNONAA >60 >60 >60  ANIONGAP 8 10 11      Hematology Recent Labs  Lab 01/23/20 2325 01/24/20 0359 01/25/20 0513  WBC 8.7 10.1 9.4  RBC 5.16 4.63 4.74  HGB 15.4 14.2 14.8  HCT 45.1 40.1 41.5  MCV 87.4 86.6 87.6  MCH 29.8 30.7 31.2  MCHC 34.1 35.4 35.7  RDW 13.0 13.2 13.3  PLT 361 324 319    BNPNo results for input(s): BNP, PROBNP in the last 168 hours.   DDimer No results for input(s): DDIMER in the last 168 hours.   Radiology    DG Chest 2 View  Result Date: 01/23/2020 CLINICAL DATA:  Chest pain EXAM: CHEST - 2  VIEW COMPARISON:  11/25/2019 FINDINGS: The heart size and mediastinal contours are within normal limits. Both lungs are clear. The visualized skeletal structures are unremarkable. IMPRESSION: No active cardiopulmonary disease. Electronically Signed   By: Helyn Numbers MD   On: 01/23/2020 23:40   CARDIAC CATHETERIZATION  Result Date: 01/24/2020  There is mild left ventricular systolic dysfunction.  The left ventricular ejection fraction is 45-50% by visual estimate.  LV end diastolic pressure is moderately elevated.  Ost RCA to Prox RCA lesion is 60% stenosed.  Prox RCA to Mid RCA lesion is 40% stenosed.  Mid LAD lesion is 90% stenosed.  Post intervention, there is a 0% residual stenosis.  A drug-eluting stent was successfully placed using a STENT RESOLUTE ONYX [id number].  Dist LAD lesion is 40% stenosed.  1.  Severe one-vessel coronary artery disease with 90% stenosis in the mid LAD which is the likely culprit for non-ST elevation myocardial infarction.  There is also moderate right coronary artery disease. 2.   Mildly reduced LV systolic function with an EF of 45% with moderate to severe anteroapical hypokinesis. 3.  Moderately elevated left ventricular end-diastolic pressure at 22 mmHg 4.  Successful angioplasty and drug-eluting stent placement to the mid LAD. Recommendations: Dual antiplatelet therapy for at least 1 year.  Consult case manager for assistance with Brilinta.  Brilinta can be switched to clopidogrel or prasugrel after 1 month of treatment if cost is an issue. I switch metoprolol to carvedilol.  Resume ACE inhibitor if renal function returns to baseline. Smoking cessation. Cardiac rehab. Likely discharge home tomorrow.   ECHOCARDIOGRAM COMPLETE  Result Date: 01/24/2020    ECHOCARDIOGRAM REPORT   Patient Name:   Logan Crawford Date of Exam: 01/24/2020 Medical Rec #:  [id number]        Height:       72.0 in Accession #:    [id number]       Weight:       205.0 lb Date of Birth:  1975-11-23       BSA:          2.153 m Patient Age:    44 years         BP:           132/72 mmHg Patient Gender: M                HR:           74 bpm. Exam Location:  [facility] Procedure: 2D Echo, Color Doppler, Cardiac Doppler and Strain Analysis Indications:     NSTEMI 121.4  History:         Patient has prior history of Echocardiogram examinations, most                  recent 11/26/2019. Risk Factors:Hypertension, Dyslipidemia and                  Current Smoker.  Sonographer:     Cristela Blue RDCS (AE) Referring Phys:  [id number] Debbe Odea Diagnosing Phys: Debbe Odea MD  Sonographer Comments: Global longitudinal strain was attempted. IMPRESSIONS  1. Left ventricular ejection fraction, by estimation, is 50%. The left ventricle has low normal function. The left ventricle demonstrates regional wall motion abnormalities (see scoring diagram/findings for description). There is mild left ventricular hypertrophy. Left ventricular diastolic parameters are consistent with Grade I diastolic dysfunction (impaired relaxation).  There is moderate hypokinesis of the left ventricular, mid-apical anteroseptal wall.  2. Right ventricular systolic function is normal.  The right ventricular size is normal.  3. The mitral valve is normal in structure. No evidence of mitral valve regurgitation.  4. The aortic valve is tricuspid. Aortic valve regurgitation is not visualized. Mild aortic valve sclerosis is present, with no evidence of aortic valve stenosis. FINDINGS  Left Ventricle: Left ventricular ejection fraction, by estimation, is 50%. The left ventricle has low normal function. The left ventricle demonstrates regional wall motion abnormalities. Moderate hypokinesis of the left ventricular, mid-apical anteroseptal wall. The left ventricular internal cavity size was normal in size. There is mild left ventricular hypertrophy. Left ventricular diastolic parameters are consistent with Grade I diastolic dysfunction (impaired relaxation). Right Ventricle: The right ventricular size is normal. No increase in right ventricular wall thickness. Right ventricular systolic function is normal. Left Atrium: Left atrial size was normal in size. Right Atrium: Right atrial size was normal in size. Pericardium: There is no evidence of pericardial effusion. Mitral Valve: The mitral valve is normal in structure. No evidence of mitral valve regurgitation. Tricuspid Valve: The tricuspid valve is normal in structure. Tricuspid valve regurgitation is not demonstrated. Aortic Valve: The aortic valve is tricuspid. Aortic valve regurgitation is not visualized. Mild aortic valve sclerosis is present, with no evidence of aortic valve stenosis. Aortic valve mean gradient measures 4.0 mmHg. Aortic valve peak gradient measures 6.1 mmHg. Aortic valve area, by VTI measures 2.28 cm. Pulmonic Valve: The pulmonic valve was not well visualized. Pulmonic valve regurgitation is not visualized. Aorta: The aortic root is normal in size and structure. Venous: The inferior vena cava was  not well visualized. IAS/Shunts: No atrial level shunt detected by color flow Doppler.  LEFT VENTRICLE PLAX 2D LVIDd:         3.55 cm     Diastology LVIDs:         2.30 cm     LV e' medial:    5.44 cm/s LV PW:         1.29 cm     LV E/e' medial:  12.6 LV IVS:        1.77 cm     LV e' lateral:   5.77 cm/s LVOT diam:     2.00 cm     LV E/e' lateral: 11.9 LV SV:         54 LV SV Index:   25 LVOT Area:     3.14 cm  LV Volumes (MOD) LV vol d, MOD A2C: 93.1 ml LV vol d, MOD A4C: 95.8 ml LV vol s, MOD A2C: 50.7 ml LV vol s, MOD A4C: 49.8 ml LV SV MOD A2C:     42.4 ml LV SV MOD A4C:     95.8 ml LV SV MOD BP:      47.5 ml RIGHT VENTRICLE RV Basal diam:  3.68 cm RV S prime:     14.40 cm/s TAPSE (M-mode): 3.3 cm LEFT ATRIUM             Index       RIGHT ATRIUM           Index LA diam:        3.40 cm 1.58 cm/m  RA Area:     12.60 cm LA Vol (A2C):   28.3 ml 13.14 ml/m RA Volume:   27.40 ml  12.73 ml/m LA Vol (A4C):   39.6 ml 18.39 ml/m LA Biplane Vol: 35.7 ml 16.58 ml/m  AORTIC VALVE  PULMONIC VALVE AV Area (Vmax):    2.16 cm    PV Vmax:        0.90 m/s AV Area (Vmean):   1.97 cm    PV Peak grad:   3.2 mmHg AV Area (VTI):     2.28 cm    RVOT Peak grad: 4 mmHg AV Vmax:           123.50 cm/s AV Vmean:          94.450 cm/s AV VTI:            0.237 m AV Peak Grad:      6.1 mmHg AV Mean Grad:      4.0 mmHg LVOT Vmax:         85.00 cm/s LVOT Vmean:        59.200 cm/s LVOT VTI:          0.172 m LVOT/AV VTI ratio: 0.73  AORTA Ao Root diam: 3.20 cm MITRAL VALVE               TRICUSPID VALVE MV Area (PHT): 3.17 cm    TR Peak grad:   8.8 mmHg MV Decel Time: 239 msec    TR Vmax:        148.00 cm/s MV E velocity: 68.40 cm/s MV A velocity: 72.30 cm/s  SHUNTS MV E/A ratio:  0.95        Systemic VTI:  0.17 m                            Systemic Diam: 2.00 cm Debbe OdeaBrian Agbor-Etang MD Electronically signed by Debbe OdeaBrian Agbor-Etang MD Signature Date/Time: 01/24/2020/9:59:04 AM    Final     Cardiac Studies   LHC  01/24/2020  There is mild left ventricular systolic dysfunction.  The left ventricular ejection fraction is 45-50% by visual estimate.  LV end diastolic pressure is moderately elevated.  Ost RCA to Prox RCA lesion is 60% stenosed.  Prox RCA to Mid RCA lesion is 40% stenosed.  Mid LAD lesion is 90% stenosed.  Post intervention, there is a 0% residual stenosis.  A drug-eluting stent was successfully placed using a STENT RESOLUTE ONYX [id number].  Dist LAD lesion is 40% stenosed.   1.  Severe one-vessel coronary artery disease with 90% stenosis in the mid LAD which is the likely culprit for non-ST elevation myocardial infarction.  There is also moderate right coronary artery disease. 2.  Mildly reduced LV systolic function with an EF of 45% with moderate to severe anteroapical hypokinesis. 3.  Moderately elevated left ventricular end-diastolic pressure at 22 mmHg 4.  Successful angioplasty and drug-eluting stent placement to the mid LAD.  Echo 11/30 1. Left ventricular ejection fraction, by estimation, is 50%. The left  ventricle has low normal function. The left ventricle demonstrates  regional wall motion abnormalities (see scoring diagram/findings for  description). There is mild left ventricular  hypertrophy. Left ventricular diastolic parameters are consistent with  Grade I diastolic dysfunction (impaired relaxation). There is moderate  hypokinesis of the left ventricular, mid-apical anteroseptal wall.  2. Right ventricular systolic function is normal. The right ventricular  size is normal.  3. The mitral valve is normal in structure. No evidence of mitral valve  regurgitation.  4. The aortic valve is tricuspid. Aortic valve regurgitation is not  visualized. Mild aortic valve sclerosis is present, with no evidence of  aortic valve stenosis.   Patient Profile  44 y.o. male with history of hypertension, hyperlipidemia, current smoker presenting with chest pain.   Diagnosed with NSTEMI, found to have significant stenosis 90% in the mid LAD.  Status post drug-eluting stent to the LAD.  Assessment & Plan    1. NSTEMI s/p DES to mLAD -RCA moderate disease 60% proximal -Aspirin, Brilinta x12 months -Increase Lipitor to 80 mg daily -Coreg 6.25 mg twice daily -Echo with low normal ejection fraction, hypokinesis of mid to apical anteroseptal wall consistent with LAD territory -Ambulate patient.  If symptom free, can be discharged on current medications with close outpatient follow-up.  2.  Hypertension -BP controlled -Coreg 6.25 mg twice daily, lisinopril 20 mg daily -Stop PTA amlodipine. -Coreg and lisinopril can be titrated as needed for adequate blood pressure control outpatient.  3.  Hyperlipidemia -Increase Lipitor to 80 mg daily -Goal LDL less than 70  4.  Current smoker -Cessation advised  Total encounter time 35 minutes  Greater than 50% was spent in counseling and coordination of care with the patient     Signed, Debbe Odea, MD  01/25/2020, 9:39 AM

## 2020-01-25 NOTE — TOC Initial Note (Signed)
Transition of Care [facility]) - Initial/Assessment Note    Patient Details  Name: Logan Crawford MRN: [id number] Date of Birth: 08-Aug-1975  [facility]) CM/SW Contact:    Hetty Ely, RN Phone Number: 01/25/2020, 10:44 AM  Clinical Narrative:   Patient alert and oriented x4, in no acute distress. Given Brilinta coupon per Attending order, patient states he will use the [facility] pharmacy to get medication, no issues with transportation, has a car. Lives alone, works part-time able to do own shopping and prepare meals. Mother lives nearby, 18yo kids. Will establish PCP in the [facility], brochure and application given. Patient voices understanding states he will make appointment. Barriers resolved.                      Patient Goals and CMS Choice        Expected Discharge Plan and Services           Expected Discharge Date: 01/25/20                                    Prior Living Arrangements/Services                       Activities of Daily Living Home Assistive Devices/Equipment: None ADL Screening (condition at time of admission) Patient's cognitive ability adequate to safely complete daily activities?: Yes Is the patient deaf or have difficulty hearing?: No Does the patient have difficulty seeing, even when wearing glasses/contacts?: No Does the patient have difficulty concentrating, remembering, or making decisions?: No Patient able to express need for assistance with ADLs?: No Does the patient have difficulty dressing or bathing?: No Independently performs ADLs?: Yes (appropriate for developmental age) Does the patient have difficulty walking or climbing stairs?: No Weakness of Legs: None Weakness of Arms/Hands: None  Permission Sought/Granted                  Emotional Assessment              Admission diagnosis:  NSTEMI (non-ST elevated myocardial infarction) [facility]) [I21.4] Patient Active Problem List    Diagnosis Date Noted  . NSTEMI (non-ST elevated myocardial infarction) (HCC) 01/24/2020  . Tobacco use 11/25/2019  . COVID-19 virus infection 11/25/2019  . Abnormal EKG 11/25/2019  . 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 Penile lesion 11/25/2019  . Hypertension   . Hyperlipidemia   . Overweight with body mass index (BMI) of 27 to 27.9 in adult    PCP:  Pcp, No Pharmacy:   [facility] [address] [place] - [address] [place] [address] Phone: [phone] Fax: [phone]     Social Determinants of Health (SDOH) Interventions    Readmission Risk Interventions No flowsheet data found.

## 2020-01-25 NOTE — Discharge Instructions (Signed)
Heart Attack A heart attack occurs when blood and oxygen supply to the heart is cut off. A heart attack causes damage to the heart that cannot be fixed. A heart attack is also called a myocardial infarction, or MI. If you think you are having a heart attack, do not wait to see if the symptoms will go away. Get medical help right away. What are the causes? This condition may be caused by:  A fatty substance (plaque) in the blood vessels (arteries). This can block the flow of blood to the heart.  A blood clot in the blood vessels that go to the heart. The blood clot blocks blood flow.  Low blood pressure.  An abnormal heartbeat.  Some diseases, such as problems in red blood cells (anemia)orproblems in breathing (respiratory failure).  Tightening (spasm) of a blood vessel that cuts off blood to the heart.  A tear in a blood vessel of the heart.  High blood pressure. What increases the risk? The following factors may make you more likely to develop this condition:  Aging. The older you are, the higher your risk.  Having a personal or family history of chest pain, heart attack, stroke, or narrowing of the arteries in the legs, arms, head, or stomach (peripheral artery disease).  Being male.  Smoking.  Not getting regular exercise.  Being overweight or obese.  Having high blood pressure.  Having high cholesterol.  Having diabetes.  Drinking too much alcohol.  Using illegal drugs, such as cocaine or methamphetamine. What are the signs or symptoms? Symptoms of this condition include:  Chest pain. It may feel like: ? Crushing or squeezing. ? Tightness, pressure, fullness, or heaviness.  Pain in the arm, neck, jaw, back, or upper body.  Shortness of breath.  Heartburn.  Upset stomach (indigestion).  Feeling like you may vomit (nauseous).  Cold sweats.  Feeling tired.  Sudden light-headedness. How is this treated? A heart attack must be treated as soon as  possible. Treatment may include:  Medicines to: ? Break up or dissolve blood clots. ? Thin blood and help prevent blood clots. ? Treat blood pressure. ? Improve blood flow to the heart. ? Reduce pain. ? Reduce cholesterol.  Procedures to widen a blocked artery and keep it open.  Open heart surgery.  Receiving oxygen.  Making your heart strong again (cardiac rehabilitation) through exercise, education, and counseling. Follow these instructions at home: Medicines  Take over-the-counter and prescription medicines only as told by your doctor. You may need to take medicine: ? To keep your blood from clotting too easily. ? To control blood pressure. ? To lower cholesterol. ? To control heart rhythms.  Do not take these medicines unless your doctor says it is okay: ? NSAIDs, such as ibuprofen. ? Supplements that have vitamin A, vitamin E, or both. ? Hormone replacement therapy that has estrogen with or without progestin. Lifestyle      Do not use any products that have nicotine or tobacco, such as cigarettes, e-cigarettes, and chewing tobacco. If you need help quitting, ask your doctor.  Avoid secondhand smoke.  Exercise regularly. Ask your doctor about a cardiac rehab program.  Eat heart-healthy foods. Your doctor will tell you what foods to eat.  Stay at a healthy weight.  Lower your stress level.  Do not use illegal drugs. Alcohol use  Do not drink alcohol if: ? Your doctor tells you not to drink. ? You are pregnant, may be pregnant, or are planning to become  pregnant.  If you drink alcohol: ? Limit how much you use to:  0-1 drink a day for women.  0-2 drinks a day for men. ? Know how much alcohol is in your drink. In the [place] one drink equals one 12 oz bottle of beer (355 mL), one 5 oz glass of wine (148 mL), or one 1 oz glass of hard liquor (44 mL). General instructions  Work with your doctor to treat other problems you may have, such as diabetes or high  blood pressure.  Get screened for depression. Get treatment if needed.  Keep your vaccines up to date. Get the flu shot (influenza vaccine) every year.  Keep all follow-up visits as told by your doctor. This is important. Contact a doctor if:  You feel very sad.  You have trouble doing your daily activities. Get help right away if:  You have sudden, unexplained discomfort in your chest, arms, back, neck, jaw, or upper body.  You have shortness of breath.  You have sudden sweating or clammy skin.  You feel like you may vomit.  You vomit.  You feel tired or weak.  You get light-headed or dizzy.  You feel your heart beating fast.  You feel your heart skipping beats.  You have blood pressure that is higher than 180/120. These symptoms may be an emergency. Do not wait to see if the symptoms will go away. Get medical help right away. Call your local emergency services (911 in the [place] Do not drive yourself to the hospital. Summary  A heart attack occurs when blood and oxygen supply to the heart is cut off.  Do not take NSAIDs unless your doctor says it is okay.  Do not smoke. Avoid secondhand smoke.  Exercise regularly. Ask your doctor about a cardiac rehab program. This information is not intended to replace advice given to you by your health care provider. Make sure you discuss any questions you have with your health care provider. Document Revised: 05/24/2018 Document Reviewed: 05/24/2018 [employer] Patient Education  2020 [employer] Heart-Healthy Eating Plan Many factors influence your heart (coronary) health, including eating and exercise habits. Coronary risk increases with abnormal blood fat (lipid) levels. Heart-healthy meal planning includes limiting unhealthy fats, increasing healthy fats, and making other diet and lifestyle changes. What is my plan? Your health care provider may recommend that you:  Limit your fat intake to _________% or less of your total  calories each day.  Limit your saturated fat intake to _________% or less of your total calories each day.  Limit the amount of cholesterol in your diet to less than _________ mg per day. What are tips for following this plan? Cooking Cook foods using methods other than frying. Baking, boiling, grilling, and broiling are all good options. Other ways to reduce fat include:  Removing the skin from poultry.  Removing all visible fats from meats.  Steaming vegetables in water or broth. Meal planning   At meals, imagine dividing your plate into fourths: ? Fill one-half of your plate with vegetables and green salads. ? Fill one-fourth of your plate with whole grains. ? Fill one-fourth of your plate with lean protein foods.  Eat 4-5 servings of vegetables per day. One serving equals 1 cup raw or cooked vegetable, or 2 cups raw leafy greens.  Eat 4-5 servings of fruit per day. One serving equals 1 medium whole fruit,  cup dried fruit,  cup fresh, frozen, or canned fruit, or  cup 100% fruit juice.  Eat more foods that contain soluble fiber. Examples include apples, broccoli, carrots, beans, peas, and barley. Aim to get 25-30 g of fiber per day.  Increase your consumption of legumes, nuts, and seeds to 4-5 servings per week. One serving of dried beans or legumes equals  cup cooked, 1 serving of nuts is  cup, and 1 serving of seeds equals 1 tablespoon. Fats  Choose healthy fats more often. Choose monounsaturated and polyunsaturated fats, such as olive and canola oils, flaxseeds, walnuts, almonds, and seeds.  Eat more omega-3 fats. Choose salmon, mackerel, sardines, tuna, flaxseed oil, and ground flaxseeds. Aim to eat fish at least 2 times each week.  Check food labels carefully to identify foods with trans fats or high amounts of saturated fat.  Limit saturated fats. These are found in animal products, such as meats, butter, and cream. Plant sources of saturated fats include palm oil,  palm kernel oil, and coconut oil.  Avoid foods with partially hydrogenated oils in them. These contain trans fats. Examples are stick margarine, some tub margarines, cookies, crackers, and other baked goods.  Avoid fried foods. General information  Eat more home-cooked food and less restaurant, buffet, and fast food.  Limit or avoid alcohol.  Limit foods that are high in starch and sugar.  Lose weight if you are overweight. Losing just 5-10% of your body weight can help your overall health and prevent diseases such as diabetes and heart disease.  Monitor your salt (sodium) intake, especially if you have high blood pressure. Talk with your health care provider about your sodium intake.  Try to incorporate more vegetarian meals weekly. What foods can I eat? Fruits All fresh, canned (in natural juice), or frozen fruits. Vegetables Fresh or frozen vegetables (raw, steamed, roasted, or grilled). Green salads. Grains Most grains. Choose whole wheat and whole grains most of the time. Rice and pasta, including brown rice and pastas made with whole wheat. Meats and other proteins Lean, well-trimmed beef, veal, pork, and lamb. Chicken and [place] without skin. All fish and shellfish. Wild duck, rabbit, pheasant, and venison. Egg whites or low-cholesterol egg substitutes. Dried beans, peas, lentils, and tofu. Seeds and most nuts. Dairy Low-fat or nonfat cheeses, including ricotta and mozzarella. Skim or 1% milk (liquid, powdered, or evaporated). Buttermilk made with low-fat milk. Nonfat or low-fat yogurt. Fats and oils Non-hydrogenated (trans-free) margarines. Vegetable oils, including soybean, sesame, sunflower, olive, peanut, safflower, corn, canola, and cottonseed. Salad dressings or mayonnaise made with a vegetable oil. Beverages Water (mineral or sparkling). Coffee and tea. Diet carbonated beverages. Sweets and desserts Sherbet, gelatin, and fruit ice. Small amounts of dark  chocolate. Limit all sweets and desserts. Seasonings and condiments All seasonings and condiments. The items listed above may not be a complete list of foods and beverages you can eat. Contact a dietitian for more options. What foods are not recommended? Fruits Canned fruit in heavy syrup. Fruit in cream or butter sauce. Fried fruit. Limit coconut. Vegetables Vegetables cooked in cheese, cream, or butter sauce. Fried vegetables. Grains Breads made with saturated or trans fats, oils, or whole milk. Croissants. Sweet rolls. Donuts. High-fat crackers, such as cheese crackers. Meats and other proteins Fatty meats, such as hot dogs, ribs, sausage, bacon, rib-eye roast or steak. High-fat deli meats, such as salami and bologna. Caviar. Domestic duck and goose. Organ meats, such as liver. Dairy Cream, sour cream, cream cheese, and creamed cottage cheese. Whole milk cheeses. Whole or 2% milk (liquid, evaporated, or condensed). Whole buttermilk. Cream  sauce or high-fat cheese sauce. Whole-milk yogurt. Fats and oils Meat fat, or shortening. Cocoa butter, hydrogenated oils, palm oil, coconut oil, palm kernel oil. Solid fats and shortenings, including bacon fat, salt pork, lard, and butter. Nondairy cream substitutes. Salad dressings with cheese or sour cream. Beverages Regular sodas and any drinks with added sugar. Sweets and desserts Frosting. Pudding. Cookies. Cakes. Pies. Milk chocolate or white chocolate. Buttered syrups. Full-fat ice cream or ice cream drinks. The items listed above may not be a complete list of foods and beverages to avoid. Contact a dietitian for more information. Summary  Heart-healthy meal planning includes limiting unhealthy fats, increasing healthy fats, and making other diet and lifestyle changes.  Lose weight if you are overweight. Losing just 5-10% of your body weight can help your overall health and prevent diseases such as diabetes and heart disease.  Focus on eating  a balance of foods, including fruits and vegetables, low-fat or nonfat dairy, lean protein, nuts and legumes, whole grains, and heart-healthy oils and fats. This information is not intended to replace advice given to you by your health care provider. Make sure you discuss any questions you have with your health care provider. Document Revised: 03/20/2017 Document Reviewed: 03/20/2017 [employer] Patient Education  2020 [employer].

## 2020-01-25 NOTE — Discharge Summary (Signed)
[facility] [place] [facility]   PATIENT NAME: Logan Crawford    MR#:  [id number]  DATE OF BIRTH:  1976/02/19  DATE OF ADMISSION:  01/24/2020 ADMITTING PHYSICIAN: Andris Baumann, MD  DATE OF DISCHARGE: 01/25/2020 11:32 AM  PRIMARY CARE PHYSICIAN: [facility]   ADMISSION DIAGNOSIS:  NSTEMI (non-ST elevated myocardial infarction) (HCC) [I21.4]  DISCHARGE DIAGNOSIS:  Principal Problem:   NSTEMI (non-ST elevated myocardial infarction) (HCC) Active Problems:   Tobacco use   Hypertension   Hyperlipidemia   SECONDARY DIAGNOSIS:   Past Medical History:  Diagnosis Date  . Hyperlipidemia   . Hypertension   . Tobacco use 11/25/2019    HOSPITAL COURSE:   1.  NSTEMI.  The patient was taken to the cardiac Cath Lab by Dr. Kirke Corin and a stent was placed in the LAD.  Patient is on aspirin, Brilinta, Coreg, atorvastatin and lisinopril.  Cardiac rehab referral.  30-day free Brilinta card given by transitional care team.  Troponin did peak at 3520 2.  Essential hypertension.  Norvasc stopped.  On Coreg and lisinopril. 3.  GERD on PPI 4.  Hyperlipidemia unspecified on atorvastatin increased dose.  LDL 83. 5.  Tobacco abuse.  Patient states that he will stop smoking. 6.  Acute kidney injury.  Creatinine 1.39 on presentation improved to 0.97 upon discharge.  DISCHARGE CONDITIONS:   Satisfactory  CONSULTS OBTAINED:  Treatment Team:  Debbe Odea, MD  DRUG ALLERGIES:  No Known Allergies  DISCHARGE MEDICATIONS:   Allergies as of 01/25/2020   No Known Allergies     Medication List    STOP taking these medications   amLODipine 10 MG tablet Commonly known as: NORVASC     TAKE these medications   albuterol 108 (90 Base) MCG/ACT inhaler Commonly known as: VENTOLIN HFA Inhale 2 puffs into the lungs every 6 (six) hours as needed for wheezing or shortness of breath.   aspirin 81 MG EC tablet Take 1 tablet (81 mg total) by mouth daily. Swallow  whole. Start taking on: January 26, 2020   atorvastatin 80 MG tablet Commonly known as: LIPITOR Take 1 tablet (80 mg total) by mouth daily. Start taking on: January 26, 2020 What changed:   medication strength  how much to take   carvedilol 6.25 MG tablet Commonly known as: COREG Take 1 tablet (6.25 mg total) by mouth 2 (two) times daily with a meal.   lisinopril 20 MG tablet Commonly known as: ZESTRIL Take 1 tablet (20 mg total) by mouth daily.   nitroGLYCERIN 0.4 MG SL tablet Commonly known as: NITROSTAT Place 1 tablet (0.4 mg total) under the tongue every 5 (five) minutes x 3 doses as needed for chest pain.   pantoprazole 40 MG tablet Commonly known as: PROTONIX Take 1 tablet (40 mg total) by mouth daily.   sertraline 50 MG tablet Commonly known as: ZOLOFT Take 1 tablet (50 mg total) by mouth daily.   ticagrelor 90 MG Tabs tablet Commonly known as: BRILINTA Take 1 tablet (90 mg total) by mouth every 12 (twelve) hours.        DISCHARGE INSTRUCTIONS:   [facility] when appointment available Follow-up cardiology 1 week  If you experience worsening of your admission symptoms, develop shortness of breath, life threatening emergency, suicidal or homicidal thoughts you must seek medical attention immediately by calling 911 or calling your MD immediately  if symptoms less severe.  You Must read complete instructions/literature along with all the possible adverse reactions/side effects  for all the Medicines you take and that have been prescribed to you. Take any new Medicines after you have completely understood and accept all the possible adverse reactions/side effects.   Please note  You were cared for by a hospitalist during your hospital stay. If you have any questions about your discharge medications or the care you received while you were in the hospital after you are discharged, you can call the unit and asked to speak with the hospitalist on call  if the hospitalist that took care of you is not available. Once you are discharged, your primary care physician will handle any further medical issues. Please note that NO REFILLS for any discharge medications will be authorized once you are discharged, as it is imperative that you return to your primary care physician (or establish a relationship with a primary care physician if you do not have one) for your aftercare needs so that they can reassess your need for medications and monitor your lab values.    Today   CHIEF COMPLAINT:   Chief Complaint  Patient presents with  . Chest Pain    HISTORY OF PRESENT ILLNESS:  Logan Crawford  is a 44 y.o. male came in with chest pain and found to have an NSTEMI.   VITAL SIGNS:  Blood pressure 125/85, pulse 75, temperature 97.9 F (36.6 C), temperature source Oral, resp. rate 17, height 6' (1.829 m), weight 92.9 kg, SpO2 100 %.  I/O:    Intake/Output Summary (Last 24 hours) at 01/25/2020 1607 Last data filed at 01/25/2020 1025 Gross per 24 hour  Intake 1620.64 ml  Output 1500 ml  Net 120.64 ml     PHYSICAL EXAMINATION:  GENERAL:  44 y.o.-year-old patient lying in the bed with no acute distress.  EYES: Pupils equal, round, reactive to light and accommodation. No scleral icterus. HEENT: Head atraumatic, normocephalic. Oropharynx and nasopharynx clear.  LUNGS: Normal breath sounds bilaterally, no wheezing, rales,rhonchi or crepitation. No use of accessory muscles of respiration.  CARDIOVASCULAR: S1, S2 normal. No murmurs, rubs, or gallops.  ABDOMEN: Soft, non-tender, non-distended.  EXTREMITIES: No pedal edema.  NEUROLOGIC: Cranial nerves II through XII are intact. Muscle strength 5/5 in all extremities. Sensation intact. Gait not checked.  PSYCHIATRIC: The patient is alert and oriented x 3.  SKIN: No obvious rash, lesion, or ulcer.   DATA REVIEW:   CBC Recent Labs  Lab 01/25/20 0513  WBC 9.4  HGB 14.8  HCT 41.5  PLT 319     Chemistries  Recent Labs  Lab 01/25/20 0513  NA 137  K 4.1  CL 103  CO2 23  GLUCOSE 97  BUN 19  CREATININE 0.97  CALCIUM 9.0  AST 24  ALT 26  ALKPHOS 73  BILITOT 0.7    Microbiology Results  Results for orders placed or performed during the hospital encounter of 01/24/20  SARS CORONAVIRUS 2 (TAT 6-24 HRS) Nasopharyngeal Nasopharyngeal Swab     Status: None   Collection Time: 01/24/20  1:18 AM   Specimen: Nasopharyngeal Swab  Result Value Ref Range Status   SARS Coronavirus 2 NEGATIVE NEGATIVE Final    Comment: (NOTE) SARS-CoV-2 target nucleic acids are NOT DETECTED.  The SARS-CoV-2 RNA is generally detectable in upper and lower respiratory specimens during the acute phase of infection. Negative results do not preclude SARS-CoV-2 infection, do not rule out co-infections with other pathogens, and should not be used as the sole basis for treatment or other patient management decisions. Negative results must  be combined with clinical observations, patient history, and epidemiological information. The expected result is Negative.  Fact Sheet for Patients: HairSlick.no  Fact Sheet for Healthcare Providers: quierodirigir.com  This test is not yet approved or cleared by the [place] FDA and  has been authorized for detection and/or diagnosis of SARS-CoV-2 by FDA under an Emergency Use Authorization (EUA). This EUA will remain  in effect (meaning this test can be used) for the duration of the COVID-19 declaration under Se ction 564(b)(1) of the Act, 21 U.S.C. section 360bbb-3(b)(1), unless the authorization is terminated or revoked sooner.  Performed at [facility], [address], [place], [place] [address]     RADIOLOGY:  DG Chest 2 View  Result Date: 01/23/2020 CLINICAL DATA:  Chest pain EXAM: CHEST - 2 VIEW COMPARISON:  11/25/2019 FINDINGS: The heart size and mediastinal contours are within  normal limits. Both lungs are clear. The visualized skeletal structures are unremarkable. IMPRESSION: No active cardiopulmonary disease. Electronically Signed   By: Helyn Numbers MD   On: 01/23/2020 23:40   CARDIAC CATHETERIZATION  Result Date: 01/24/2020  There is mild left ventricular systolic dysfunction.  The left ventricular ejection fraction is 45-50% by visual estimate.  LV end diastolic pressure is moderately elevated.  Ost RCA to Prox RCA lesion is 60% stenosed.  Prox RCA to Mid RCA lesion is 40% stenosed.  Mid LAD lesion is 90% stenosed.  Post intervention, there is a 0% residual stenosis.  A drug-eluting stent was successfully placed using a STENT RESOLUTE ONYX [id number].  Dist LAD lesion is 40% stenosed.  1.  Severe one-vessel coronary artery disease with 90% stenosis in the mid LAD which is the likely culprit for non-ST elevation myocardial infarction.  There is also moderate right coronary artery disease. 2.  Mildly reduced LV systolic function with an EF of 45% with moderate to severe anteroapical hypokinesis. 3.  Moderately elevated left ventricular end-diastolic pressure at 22 mmHg 4.  Successful angioplasty and drug-eluting stent placement to the mid LAD. Recommendations: Dual antiplatelet therapy for at least 1 year.  Consult case manager for assistance with Brilinta.  Brilinta can be switched to clopidogrel or prasugrel after 1 month of treatment if cost is an issue. I switch metoprolol to carvedilol.  Resume ACE inhibitor if renal function returns to baseline. Smoking cessation. Cardiac rehab. Likely discharge home tomorrow.   ECHOCARDIOGRAM COMPLETE  Result Date: 01/24/2020    ECHOCARDIOGRAM REPORT   Patient Name:   BUTCH OTTERSON Date of Exam: 01/24/2020 Medical Rec #:  [id number]        Height:       72.0 in Accession #:    [id number]       Weight:       205.0 lb Date of Birth:  [birth year]       BSA:          2.153 m Patient Age:    44 years         BP:           132/72  mmHg Patient Gender: M                HR:           74 bpm. Exam Location:  [facility] Procedure: 2D Echo, Color Doppler, Cardiac Doppler and Strain Analysis Indications:     NSTEMI 121.4  History:         Patient has prior history of Echocardiogram examinations, most  recent 11/26/2019. Risk Factors:Hypertension, Dyslipidemia and                  Current Smoker.  Sonographer:     Cristela Blue RDCS (AE) Referring Phys:  [id number] Debbe Odea Diagnosing Phys: Debbe Odea MD  Sonographer Comments: Global longitudinal strain was attempted. IMPRESSIONS  1. Left ventricular ejection fraction, by estimation, is 50%. The left ventricle has low normal function. The left ventricle demonstrates regional wall motion abnormalities (see scoring diagram/findings for description). There is mild left ventricular hypertrophy. Left ventricular diastolic parameters are consistent with Grade I diastolic dysfunction (impaired relaxation). There is moderate hypokinesis of the left ventricular, mid-apical anteroseptal wall.  2. Right ventricular systolic function is normal. The right ventricular size is normal.  3. The mitral valve is normal in structure. No evidence of mitral valve regurgitation.  4. The aortic valve is tricuspid. Aortic valve regurgitation is not visualized. Mild aortic valve sclerosis is present, with no evidence of aortic valve stenosis. FINDINGS  Left Ventricle: Left ventricular ejection fraction, by estimation, is 50%. The left ventricle has low normal function. The left ventricle demonstrates regional wall motion abnormalities. Moderate hypokinesis of the left ventricular, mid-apical anteroseptal wall. The left ventricular internal cavity size was normal in size. There is mild left ventricular hypertrophy. Left ventricular diastolic parameters are consistent with Grade I diastolic dysfunction (impaired relaxation). Right Ventricle: The right ventricular size is normal. No increase in right  ventricular wall thickness. Right ventricular systolic function is normal. Left Atrium: Left atrial size was normal in size. Right Atrium: Right atrial size was normal in size. Pericardium: There is no evidence of pericardial effusion. Mitral Valve: The mitral valve is normal in structure. No evidence of mitral valve regurgitation. Tricuspid Valve: The tricuspid valve is normal in structure. Tricuspid valve regurgitation is not demonstrated. Aortic Valve: The aortic valve is tricuspid. Aortic valve regurgitation is not visualized. Mild aortic valve sclerosis is present, with no evidence of aortic valve stenosis. Aortic valve mean gradient measures 4.0 mmHg. Aortic valve peak gradient measures 6.1 mmHg. Aortic valve area, by VTI measures 2.28 cm. Pulmonic Valve: The pulmonic valve was not well visualized. Pulmonic valve regurgitation is not visualized. Aorta: The aortic root is normal in size and structure. Venous: The inferior vena cava was not well visualized. IAS/Shunts: No atrial level shunt detected by color flow Doppler.  LEFT VENTRICLE PLAX 2D LVIDd:         3.55 cm     Diastology LVIDs:         2.30 cm     LV e' medial:    5.44 cm/s LV PW:         1.29 cm     LV E/e' medial:  12.6 LV IVS:        1.77 cm     LV e' lateral:   5.77 cm/s LVOT diam:     2.00 cm     LV E/e' lateral: 11.9 LV SV:         54 LV SV Index:   25 LVOT Area:     3.14 cm  LV Volumes (MOD) LV vol d, MOD A2C: 93.1 ml LV vol d, MOD A4C: 95.8 ml LV vol s, MOD A2C: 50.7 ml LV vol s, MOD A4C: 49.8 ml LV SV MOD A2C:     42.4 ml LV SV MOD A4C:     95.8 ml LV SV MOD BP:      47.5 ml RIGHT VENTRICLE RV Basal  diam:  3.68 cm RV S prime:     14.40 cm/s TAPSE (M-mode): 3.3 cm LEFT ATRIUM             Index       RIGHT ATRIUM           Index LA diam:        3.40 cm 1.58 cm/m  RA Area:     12.60 cm LA Vol (A2C):   28.3 ml 13.14 ml/m RA Volume:   27.40 ml  12.73 ml/m LA Vol (A4C):   39.6 ml 18.39 ml/m LA Biplane Vol: 35.7 ml 16.58 ml/m  AORTIC VALVE                    PULMONIC VALVE AV Area (Vmax):    2.16 cm    PV Vmax:        0.90 m/s AV Area (Vmean):   1.97 cm    PV Peak grad:   3.2 mmHg AV Area (VTI):     2.28 cm    RVOT Peak grad: 4 mmHg AV Vmax:           123.50 cm/s AV Vmean:          94.450 cm/s AV VTI:            0.237 m AV Peak Grad:      6.1 mmHg AV Mean Grad:      4.0 mmHg LVOT Vmax:         85.00 cm/s LVOT Vmean:        59.200 cm/s LVOT VTI:          0.172 m LVOT/AV VTI ratio: 0.73  AORTA Ao Root diam: 3.20 cm MITRAL VALVE               TRICUSPID VALVE MV Area (PHT): 3.17 cm    TR Peak grad:   8.8 mmHg MV Decel Time: 239 msec    TR Vmax:        148.00 cm/s MV E velocity: 68.40 cm/s MV A velocity: 72.30 cm/s  SHUNTS MV E/A ratio:  0.95        Systemic VTI:  0.17 m                            Systemic Diam: 2.00 cm Debbe Odea MD Electronically signed by Debbe Odea MD Signature Date/Time: 01/24/2020/9:59:04 AM    Final      Management plans discussed with the patient, and he is in agreement.  Patient deferred me calling any family  CODE STATUS:     Code Status Orders  (From admission, onward)         Start     Ordered   01/24/20 0244  Full code  Continuous        01/24/20 0247        Code Status History    Date Active Date Inactive Code Status Order ID Comments User Context   11/25/2019 1909 11/27/2019 1858 Full Code [id number]  Bobette Mo, MD ED   11/25/2019 1909 11/25/2019 1909 Full Code [id number]  Bobette Mo, MD ED   Advance Care Planning Activity      TOTAL TIME TAKING CARE OF THIS PATIENT: 35 minutes.    Alford Highland M.D on 01/25/2020 at 4:07 PM  Between 7am to 6pm - Pager - [phone]  After 6pm go to [email or web address] - password [email or web address]  [facility]  CC: Primary care physician; [facility]

## 2020-01-25 NOTE — TOC Progression Note (Signed)
Transition of Care [facility]) - Progression Note    Patient Details  Name: Logan Crawford MRN: [id number] Date of Birth: 26-Feb-1975  Transition of [facility]) CM/SW Contact  Hetty Ely, RN Phone Number: 01/25/2020, 10:48 AM  Clinical Narrative:  Patient with Brilinta coupon and [facility] brochure/application. Understands to establish primary care and to get medication and take as prescribed by Attending. Patient voices understanding, will use the [facility] in [place]. Barriers resolved.          Expected Discharge Plan and Services           Expected Discharge Date: 01/25/20                                     Social Determinants of Health (SDOH) Interventions    Readmission Risk Interventions No flowsheet data found.

## 2020-01-29 NOTE — Progress Notes (Signed)
Office Visit    Patient Name: Logan Crawford Date of Encounter: 01/30/2020  Primary Care Provider:  Pcp, No Primary Cardiologist:  Debbe Odea, MD Electrophysiologist:  None   Chief Complaint    Logan Crawford is a 44 y.o. male with a hx of  CAD s/p NSTEMI and DES to LAD 01/24/20, HTN, HLD, tobacco use presents today for follow up after NSTEMI.   Past Medical History    Past Medical History:  Diagnosis Date  . Hyperlipidemia   . Hypertension   . Tobacco use 11/25/2019   Past Surgical History:  Procedure Laterality Date  . CORONARY STENT INTERVENTION N/A 01/24/2020   Procedure: CORONARY STENT INTERVENTION;  Surgeon: Iran Ouch, MD;  Location: [facility] INVASIVE CV LAB;  Service: Cardiovascular;  Laterality: N/A;  . IRRIGATION AND DEBRIDEMENT KNEE Left   . LEFT HEART CATH AND CORONARY ANGIOGRAPHY N/A 01/24/2020   Procedure: LEFT HEART CATH AND CORONARY ANGIOGRAPHY;  Surgeon: Iran Ouch, MD;  Location: [facility] INVASIVE CV LAB;  Service: Cardiovascular;  Laterality: N/A;  . TYMPANOSTOMY TUBE PLACEMENT Bilateral     Allergies  No Known Allergies  History of Present Illness    Logan Crawford is a 44 y.o. male with a hx of CAD s/p NSTEMI and DES to LAD 01/24/20, HTN, HLD, tobacco use  last seen while hospitalized.  He presents to the [facility] ED 01/24/20 due to chest pain. Family history notable for coronary disease in his mother in her 37s. He was diagnosed with NSTEMI. Echocardiogram 01/24/20 LVEF 50%, wall motion abnormalities noted, mild LVH, gr1DD, mild aortic valve sclerosis without stenosis . Cardiac catheterization 01/24/20 with severe one-vessel coronary disease with 90% LAD stenosis with DES to mLAD. Also noted moderate RCA disease. Mildly reduced LVEF 45% with moderate to sever anteroapical hypokinesis. Moderately elevated LVEDP . Recommended for DAPT for at least 1 year. He was discharged on Brilinta, Coreg.   He presents today with his wife for  follow up. Notes not following a heart healthy diet but that his wife has been helping him make changes. He did go out with some friends over the weekends and felt poorly after 3 beers. We discussed the importance of abstaining from alcohol. He does not some right shoulder pain over the last few days that is different than his anginal equivalent. He has been playing pool and uses his right arm for this.   Reports no exertional dyspnea nor shortness of breath. He does note one episode of chest pain last night which lasted for 30 seconds and woke him from sleep. Tells me it felt similar to his anginal equivalent and he took one nitroglycerin with good relief. The pain has not recurred.   EKGs/Labs/Other Studies Reviewed:   The following studies were reviewed today: LHC 04/01/19  There is mild left ventricular systolic dysfunction.  The left ventricular ejection fraction is 45-50% by visual estimate.  LV end diastolic pressure is moderately elevated.  Ost RCA to Prox RCA lesion is 60% stenosed.  Prox RCA to Mid RCA lesion is 40% stenosed.  Mid LAD lesion is 90% stenosed.  Post intervention, there is a 0% residual stenosis.  A drug-eluting stent was successfully placed using a STENT RESOLUTE ONYX [id number].  Dist LAD lesion is 40% stenosed.   1.  Severe one-vessel coronary artery disease with 90% stenosis in the mid LAD which is the likely culprit for non-ST elevation myocardial infarction.  There is also moderate right coronary artery disease. 2.  Mildly reduced LV systolic function with an EF of 45% with moderate to severe anteroapical hypokinesis. 3.  Moderately elevated left ventricular end-diastolic pressure at 22 mmHg 4.  Successful angioplasty and drug-eluting stent placement to the mid LAD.   Recommendations: Dual antiplatelet therapy for at least 1 year.  Consult case manager for assistance with Brilinta.  Brilinta can be switched to clopidogrel or prasugrel after 1 month of  treatment if cost is an issue. I switch metoprolol to carvedilol.  Resume ACE inhibitor if renal function returns to baseline. Smoking cessation. Cardiac rehab. Likely discharge home tomorrow.  Echo 01/24/20   1. Left ventricular ejection fraction, by estimation, is 50%. The left  ventricle has low normal function. The left ventricle demonstrates  regional wall motion abnormalities (see scoring diagram/findings for  description). There is mild left ventricular  hypertrophy. Left ventricular diastolic parameters are consistent with  Grade I diastolic dysfunction (impaired relaxation). There is moderate  hypokinesis of the left ventricular, mid-apical anteroseptal wall.   2. Right ventricular systolic function is normal. The right ventricular  size is normal.   3. The mitral valve is normal in structure. No evidence of mitral valve  regurgitation.   4. The aortic valve is tricuspid. Aortic valve regurgitation is not  visualized. Mild aortic valve sclerosis is present, with no evidence of  aortic valve stenosis.   EKG:  EKG is ordered today.  The ekg ordered today demonstrates NSR 75 bpm with stable prior inferior infarct and anterolateral TWI (V2-V6). No acute St/T wave changes.   Recent Labs: 11/25/2019: B Natriuretic Peptide 27.6 01/25/2020: ALT 26; BUN 19; Creatinine, Ser 0.97; Hemoglobin 14.8; Platelets 319; Potassium 4.1; Sodium 137  Recent Lipid Panel    Component Value Date/Time   CHOL 173 01/24/2020 0359   TRIG 260 (H) 01/24/2020 0359   HDL 38 (L) 01/24/2020 0359   CHOLHDL 4.6 01/24/2020 0359   VLDL 52 (H) 01/24/2020 0359   LDLCALC 83 01/24/2020 0359    Home Medications   Current Meds  Medication Sig  . albuterol (VENTOLIN HFA) 108 (90 Base) MCG/ACT inhaler Inhale 2 puffs into the lungs every 6 (six) hours as needed for wheezing or shortness of breath.  Marland Kitchen aspirin EC 81 MG EC tablet Take 1 tablet (81 mg total) by mouth daily. Swallow whole.  Marland Kitchen atorvastatin (LIPITOR) 80  MG tablet Take 1 tablet (80 mg total) by mouth daily.  . carvedilol (COREG) 6.25 MG tablet Take 1 tablet (6.25 mg total) by mouth 2 (two) times daily with a meal.  . lisinopril (ZESTRIL) 20 MG tablet Take 1 tablet (20 mg total) by mouth daily.  . Multiple Vitamin (MULTIVITAMIN) tablet Take 1 tablet by mouth daily.  . Niacin (VITAMIN B-3 PO) Take by mouth daily.  . nitroGLYCERIN (NITROSTAT) 0.4 MG SL tablet Place 1 tablet (0.4 mg total) under the tongue every 5 (five) minutes x 3 doses as needed for chest pain.  . pantoprazole (PROTONIX) 40 MG tablet Take 1 tablet (40 mg total) by mouth daily.  . sertraline (ZOLOFT) 50 MG tablet Take 1 tablet (50 mg total) by mouth daily.  . ticagrelor (BRILINTA) 90 MG TABS tablet Take 1 tablet (90 mg total) by mouth every 12 (twelve) hours.  . [DISCONTINUED] atorvastatin (LIPITOR) 80 MG tablet Take 1 tablet (80 mg total) by mouth daily.  . [DISCONTINUED] carvedilol (COREG) 6.25 MG tablet Take 1 tablet (6.25 mg total) by mouth 2 (two) times daily with a meal.  . [DISCONTINUED] lisinopril (ZESTRIL)  20 MG tablet Take 1 tablet (20 mg total) by mouth daily.  . [DISCONTINUED] ticagrelor (BRILINTA) 90 MG TABS tablet Take 1 tablet (90 mg total) by mouth every 12 (twelve) hours.     Review of Systems  All other systems reviewed and are otherwise negative except as noted above.  Physical Exam    VS:  BP 120/80 (BP Location: Left Arm, Patient Position: Sitting, Cuff Size: Normal)   Pulse 75   Ht 6' (1.829 m)   Wt 211 lb 2 oz (95.8 kg)   SpO2 98%   BMI 28.63 kg/m  , BMI Body mass index is 28.63 kg/m.  Wt Readings from Last 3 Encounters:  01/30/20 211 lb 2 oz (95.8 kg)  01/25/20 204 lb 12.8 oz (92.9 kg)  11/27/19 200 lb 9.9 oz (91 kg)    GEN: Well nourished, well developed, in no acute distress. HEENT: normal. Neck: Supple, no JVD, carotid bruits, or masses. Cardiac: RRR, no murmurs, rubs, or gallops. No clubbing, cyanosis, edema.  Radials/DP/PT 2+ and equal  bilaterally.  Respiratory:  Respirations regular and unlabored, clear to auscultation bilaterally. GI: Soft, nontender, nondistended. MS: No deformity or atrophy. R shoulder and R trapezius tender on palpation.  Skin: Warm and dry, no rash. R radial cath site clean, dry, intact with no ecchymosis nor hematoma.  Neuro:  Strength and sensation are intact. Psych: Normal affect.  Assessment & Plan    1. CAD with NSTEMI s/p DES to mLAD 01/24/20 - LVEF during cath estimated at 45-50%. By echo 01/24/20 LVEF 50%. Known residual disease to RCA and distal LAD. Recommended for DAPT for at least 1 year from intervention - on brilinta and aspirin at discharge. 8 day Brilinta sample provided and filled out Brilinta patient assistance. If assistance denied, will transition to Plavix. Additional GDMT includes Coreg, Atorvastatin, Lisinopril, PRN nitroglycerin. Single episode of chest pain waking him from sleep since discharge relieved by 1 nitroglycerin. Encouraged to utilize PRN nitroglycerin and report recurrence. Low suspicion stent failure as pain has not recurred and EKG today shows stable anterolateral TWI with prior inferior infarct unable to be excluded. If angina recurs, consider Imdur. Encouraged to participate in cardiac rehab.   2. HTN - BP well controlled. Continue current antihypertensive regimen including Coreg 6.25mg  BID, Lisinopril 20mg  QD. Refills provided.  3. Right shoulder pain - R shoulder and R trapezius tender on palpation. R radial pulse 2+. No swelling nor redness. Full ROM and strength of bilateral UE 5/5. Suspect musculoskeletal injury. Encouraged to utilize heat, ice, Tylenol and rest the arm.   4. HLD, LDL goal <70 - 01/24/20 with HDL 38, LDL 83, triglycerides 260. His Atorvastatin was up-titrated to 80mg  QD while hospitalized. Plan for repeat lipid panel in 6 weeks. He will have lipid/CMP collected a few days prior to [facility].  5. Tobacco abuse - Has not  smoked since hospital discharge. Continued cessation encouraged.   Disposition: Follow up in 6 week(s) with Dr. 01/26/20 or APP   Signed, , NP 01/30/2020, 10:55 AM [facility]

## 2020-01-30 ENCOUNTER — Other Ambulatory Visit: Payer: Self-pay

## 2020-01-30 ENCOUNTER — Ambulatory Visit (INDEPENDENT_AMBULATORY_CARE_PROVIDER_SITE_OTHER): Payer: Self-pay | Admitting: Family

## 2020-01-30 ENCOUNTER — Encounter: Payer: Self-pay | Admitting: Family

## 2020-01-30 VITALS — BP 120/80 | HR 75 | Ht 72.0 in | Wt 211.1 lb

## 2020-01-30 DIAGNOSIS — I25118 Atherosclerotic heart disease of native coronary artery with other forms of angina pectoris: Secondary | ICD-10-CM

## 2020-01-30 DIAGNOSIS — E785 Hyperlipidemia, unspecified: Secondary | ICD-10-CM

## 2020-01-30 DIAGNOSIS — Z720 Tobacco use: Secondary | ICD-10-CM

## 2020-01-30 DIAGNOSIS — M25511 Pain in right shoulder: Secondary | ICD-10-CM

## 2020-01-30 DIAGNOSIS — E782 Mixed hyperlipidemia: Secondary | ICD-10-CM

## 2020-01-30 DIAGNOSIS — I10 Essential (primary) hypertension: Secondary | ICD-10-CM

## 2020-01-30 MED ORDER — LISINOPRIL 20 MG PO TABS: 20.0000 mg | ORAL_TABLET | Freq: Every day | ORAL | 1 refills | Status: DC

## 2020-01-30 MED ORDER — ATORVASTATIN CALCIUM 80 MG PO TABS: 80.0000 mg | ORAL_TABLET | Freq: Every day | ORAL | 1 refills | Status: DC

## 2020-01-30 MED ORDER — TICAGRELOR 90 MG PO TABS: 90.0000 mg | ORAL_TABLET | Freq: Two times a day (BID) | ORAL | 1 refills | Status: DC

## 2020-01-30 MED ORDER — CARVEDILOL 6.25 MG PO TABS: 6.2500 mg | ORAL_TABLET | Freq: Two times a day (BID) | ORAL | 1 refills | Status: DC

## 2020-01-30 NOTE — Patient Instructions (Addendum)
Medication Instructions:  No medication changes today.   Medication Samples have been provided to the patient.  Drug name: Brilinta        Strength: 90 mg         Qty: 2 bottles   LOT: [id number]   Exp.Date: 03/2022  *If you need a refill on your cardiac medications before your next appointment, please call your pharmacy*   Lab Work: Your physician recommends that you return for lab work a few days prior to your next appointment. Roughly around January 17th go to [facility] Entr[facility] to have labs done. No appointment is needed but make sure to not eat or drink anything after midnight the day before.   Testing/Procedures: Your EKG today was stable compared to previous.   Follow-Up: At [facility] you and your health needs are our priority.  As part of our continuing mission to provide you with exceptional heart care, we have created designated Provider Care Teams.  These Care Teams include your primary Cardiologist (physician) and Advanced Practice Providers (APPs -  Physician Assistants and Nurse Practitioners) who all work together to provide you with the care you need, when you need it.   Your next appointment:   6 week(s)  The format for your next appointment:   In Person  Provider:   You may see Debbe Odea, MD or one of the following Advanced Practice Providers on your designated Care Team:    Nicolasa Ducking, NP  Eula Listen, PA-C  Marisue Ivan, PA-C  Cadence [place], [place]  Gillian Shields, NP  Other instructions:   You have been referred to cardiac rehab. This is a combination program including monitored exercise, dietary education, and support group. We strongly recommend participating in the program. Expect a phone call from them in approximately 2 weeks. If you do not hear from them, the phone number for cardiac rehab at [facility] is [phone].    Fat and Cholesterol Restricted Eating Plan Getting too much fat and cholesterol in your diet may  cause health problems. Choosing the right foods helps keep your fat and cholesterol at normal levels. This can keep you from getting certain diseases.  What are tips for following this plan? Meal planning  At meals, divide your plate into four equal parts: ? Fill one-half of your plate with vegetables and green salads. ? Fill one-fourth of your plate with whole grains. ? Fill one-fourth of your plate with low-fat (lean) protein foods.  Eat fish that is high in omega-3 fats at least two times a week. This includes mackerel, tuna, sardines, and salmon.  Eat foods that are high in fiber, such as whole grains, beans, apples, broccoli, carrots, peas, and barley. General tips   Work with your doctor to lose weight if you need to.  Avoid: ? Foods with added sugar. ? Fried foods. ? Foods with partially hydrogenated oils.  Limit alcohol intake to no more than 1 drink a day for nonpregnant women and 2 drinks a day for men. One drink equals 12 oz of beer, 5 oz of wine, or 1 oz of hard liquor. Reading food labels  Check food labels for: ? Trans fats. ? Partially hydrogenated oils. ? Saturated fat (g) in each serving. ? Cholesterol (mg) in each serving. ? Fiber (g) in each serving.  Choose foods with healthy fats, such as: ? Monounsaturated fats. ? Polyunsaturated fats. ? Omega-3 fats.  Choose grain products that have whole grains. Look for the word "whole" as the  first word in the ingredient list. Cooking  Cook foods using low-fat methods. These include baking, boiling, grilling, and broiling.  Eat more home-cooked foods. Eat at restaurants and buffets less often.  Avoid cooking using saturated fats, such as butter, cream, palm oil, palm kernel oil, and coconut oil. Recommended foods  Fruits  All fresh, canned (in natural juice), or frozen fruits. Vegetables  Fresh or frozen vegetables (raw, steamed, roasted, or grilled). Green salads. Grains  Whole grains, such as whole  wheat or whole grain breads, crackers, cereals, and pasta. Unsweetened oatmeal, bulgur, barley, quinoa, or brown rice. Corn or whole wheat flour tortillas. Meats and other protein foods  Ground beef (85% or leaner), grass-fed beef, or beef trimmed of fat. Skinless chicken or [place]. Ground chicken or [place]. Pork trimmed of fat. All fish and seafood. Egg whites. Dried beans, peas, or lentils. Unsalted nuts or seeds. Unsalted canned beans. Nut butters without added sugar or oil. Dairy  Low-fat or nonfat dairy products, such as skim or 1% milk, 2% or reduced-fat cheeses, low-fat and fat-free ricotta or cottage cheese, or plain low-fat and nonfat yogurt. Fats and oils  Tub margarine without trans fats. Light or reduced-fat mayonnaise and salad dressings. Avocado. Olive, canola, sesame, or safflower oils. The items listed above may not be a complete list of foods and beverages you can eat. Contact a dietitian for more information. Foods to avoid Fruits  Canned fruit in heavy syrup. Fruit in cream or butter sauce. Fried fruit. Vegetables  Vegetables cooked in cheese, cream, or butter sauce. Fried vegetables. Grains  White bread. White pasta. White rice. Cornbread. Bagels, pastries, and croissants. Crackers and snack foods that contain trans fat and hydrogenated oils. Meats and other protein foods  Fatty cuts of meat. Ribs, chicken wings, bacon, sausage, bologna, salami, chitterlings, fatback, hot dogs, bratwurst, and packaged lunch meats. Liver and organ meats. Whole eggs and egg yolks. Chicken and [place] with skin. Fried meat. Dairy  Whole or 2% milk, cream, half-and-half, and cream cheese. Whole milk cheeses. Whole-fat or sweetened yogurt. Full-fat cheeses. Nondairy creamers and whipped toppings. Processed cheese, cheese spreads, and cheese curds. Beverages  Alcohol. Sugar-sweetened drinks such as sodas, lemonade, and fruit drinks. Fats and oils  Butter, stick margarine, lard,  shortening, ghee, or bacon fat. Coconut, palm kernel, and palm oils. Sweets and desserts  Corn syrup, sugars, honey, and molasses. Candy. Jam and jelly. Syrup. Sweetened cereals. Cookies, pies, cakes, donuts, muffins, and ice cream. The items listed above may not be a complete list of foods and beverages you should avoid. Contact a dietitian for more information. Summary  Choosing the right foods helps keep your fat and cholesterol at normal levels. This can keep you from getting certain diseases.  At meals, fill one-half of your plate with vegetables and green salads.  Eat high-fiber foods, like whole grains, beans, apples, carrots, peas, and barley.  Limit added sugar, saturated fats, alcohol, and fried foods. This information is not intended to replace advice given to you by your health care provider. Make sure you discuss any questions you have with your health care provider. Document Revised: 10/14/2017 Document Reviewed: 10/28/2016 Elsevier Patient Education  2020 [employer].

## 2020-02-10 ENCOUNTER — Encounter: Payer: Self-pay | Admitting: Family

## 2020-03-12 ENCOUNTER — Ambulatory Visit: Payer: Self-pay | Admitting: Cardiology

## 2020-03-13 ENCOUNTER — Ambulatory Visit: Payer: Self-pay | Admitting: Cardiology

## 2020-03-22 ENCOUNTER — Ambulatory Visit (INDEPENDENT_AMBULATORY_CARE_PROVIDER_SITE_OTHER): Payer: Self-pay | Admitting: Cardiology

## 2020-03-22 ENCOUNTER — Other Ambulatory Visit: Payer: Self-pay

## 2020-03-22 ENCOUNTER — Encounter: Payer: Self-pay | Admitting: Cardiology

## 2020-03-22 VITALS — BP 100/70 | HR 85 | Ht 72.0 in | Wt 207.1 lb

## 2020-03-22 DIAGNOSIS — R0683 Snoring: Secondary | ICD-10-CM

## 2020-03-22 DIAGNOSIS — I2510 Atherosclerotic heart disease of native coronary artery without angina pectoris: Secondary | ICD-10-CM

## 2020-03-22 DIAGNOSIS — E785 Hyperlipidemia, unspecified: Secondary | ICD-10-CM

## 2020-03-22 DIAGNOSIS — I10 Essential (primary) hypertension: Secondary | ICD-10-CM

## 2020-03-22 MED ORDER — ISOSORBIDE MONONITRATE ER 30 MG PO TB24: 30.0000 mg | ORAL_TABLET | Freq: Every day | ORAL | 1 refills | Status: DC

## 2020-03-22 MED ORDER — LISINOPRIL 5 MG PO TABS: 5.0000 mg | ORAL_TABLET | Freq: Every day | ORAL | 1 refills | Status: DC

## 2020-03-22 MED ORDER — NITROGLYCERIN 0.4 MG SL SUBL: 0.4000 mg | SUBLINGUAL_TABLET | SUBLINGUAL | 0 refills | Status: DC | PRN

## 2020-03-22 NOTE — Patient Instructions (Addendum)
Medication Instructions:   Your physician has recommended you make the following change in your medication:   Decrease your Lisinopril 20 mg to Lisinopril 5 mg daily.  Start Isosorbide (Imdur) 30 mg daily.  *If you need a refill on your cardiac medications before your next appointment, please call your pharmacy*   Lab Work:   None  If you have labs (blood work) drawn today and your tests are completely normal, you will receive your results only by: Marland Kitchen MyChart Message (if you have MyChart) OR . A paper copy in the mail If you have any lab test that is abnormal or we need to change your treatment, we will call you to review the results.   Testing/Procedures:  None   Follow-Up: At [facility], you and your health needs are our priority.  As part of our continuing mission to provide you with exceptional heart care, we have created designated Provider Care Teams.  These Care Teams include your primary Cardiologist (physician) and Advanced Practice Providers (APPs -  Physician Assistants and Nurse Practitioners) who all work together to provide you with the care you need, when you need it.  We recommend signing up for the patient portal called "MyChart".  Sign up information is provided on this After Visit Summary.  MyChart is used to connect with patients for Virtual Visits (Telemedicine).  Patients are able to view lab/test results, encounter notes, upcoming appointments, etc.  Non-urgent messages can be sent to your provider as well.   To learn more about what you can do with MyChart, go to [email or web address].    Your next appointment:   2 month(s)  The format for your next appointment:   In Person  Provider:   You may see Debbe Odea, MD or one of the following Advanced Practice Providers on your designated Care Team:    Nicolasa Ducking, NP  Eula Listen, PA-C  Marisue Ivan, PA-C  Cadence [place], [place]  Gillian Shields, NP    Other Instructions

## 2020-03-22 NOTE — Progress Notes (Signed)
Cardiology Office Note:    Date:  03/22/2020   ID:  [name], DOB 02-06-1976, MRN [id number]  PCP:  Aviva Kluver  [facility] Cardiologist:  Debbe Odea, MD  [facility] Electrophysiologist:  None   Referring MD: No ref. provider found   Chief Complaint  Patient presents with  . Other    6 wk f/u c/o chest pain at bedtime pt has used nitro only has 3 pills left, fatigue and not sleeping well. Meds reviewed verbally with pt.    History of Present Illness:    Logan Crawford is a 45 y.o. male with a hx of history of CAD/NSTEMI (12/2019 s/p DES to mLAD), hypertension, hyperlipidemia, former smoker x25+ years presenting for follow-up.  Patient initially seen in the hospital 12/2019 with symptoms of chest pain.  Diagnosed with NSTEMI, found to have significant 90% stenosis in the mid LAD.  Underwent drug-eluting stent placement to the mid LAD.  Was placed on aspirin and Brilinta.  Started on Coreg and lisinopril for BP support.  Amlodipine was stopped on discharge.  He states having fatigue, snoring, daytime somnolence over the past several months.  Occasionally has chest discomfort when he goes to sleep.  Relief with sublingual nitro.   Prior notes Echo 11/30, EF 50% hypokinesis of the mid to apical anteroseptal wall. Left heart cath 12/2019 severe one-vessel CAD 90% stenosis, s/p DES to mid LAD.  Proximal RCA 60%, mid RCA 40%.  Past Medical History:  Diagnosis Date  . CAD (coronary artery disease)   . Hyperlipidemia   . Hypertension   . Tobacco use 11/25/2019    Past Surgical History:  Procedure Laterality Date  . CORONARY STENT INTERVENTION N/A 01/24/2020   Procedure: CORONARY STENT INTERVENTION;  Surgeon: Iran Ouch, MD;  Location: [facility] INVASIVE CV LAB;  Service: Cardiovascular;  Laterality: N/A;  . IRRIGATION AND DEBRIDEMENT KNEE Left   . LEFT HEART CATH AND CORONARY ANGIOGRAPHY N/A 01/24/2020   Procedure: LEFT HEART CATH AND CORONARY ANGIOGRAPHY;   Surgeon: Iran Ouch, MD;  Location: [facility] INVASIVE CV LAB;  Service: Cardiovascular;  Laterality: N/A;  . TYMPANOSTOMY TUBE PLACEMENT Bilateral     Current Medications: Current Meds  Medication Sig  . albuterol (VENTOLIN HFA) 108 (90 Base) MCG/ACT inhaler Inhale 2 puffs into the lungs every 6 (six) hours as needed for wheezing or shortness of breath.  Marland Kitchen amLODipine (NORVASC) 10 MG tablet Take by mouth.  Marland Kitchen aspirin EC 81 MG EC tablet Take 1 tablet (81 mg total) by mouth daily. Swallow whole.  Marland Kitchen atorvastatin (LIPITOR) 80 MG tablet Take 1 tablet (80 mg total) by mouth daily.  . carvedilol (COREG) 6.25 MG tablet Take 1 tablet (6.25 mg total) by mouth 2 (two) times daily with a meal.  . isosorbide mononitrate (IMDUR) 30 MG 24 hr tablet Take 1 tablet (30 mg total) by mouth daily.  . Multiple Vitamin (MULTIVITAMIN) tablet Take 1 tablet by mouth daily.  . Niacin (VITAMIN B-3 PO) Take by mouth daily.  . pantoprazole (PROTONIX) 40 MG tablet Take 1 tablet (40 mg total) by mouth daily.  . ticagrelor (BRILINTA) 90 MG TABS tablet Take 1 tablet (90 mg total) by mouth every 12 (twelve) hours.  . [DISCONTINUED] lisinopril (ZESTRIL) 20 MG tablet Take 1 tablet (20 mg total) by mouth daily.  . [DISCONTINUED] nitroGLYCERIN (NITROSTAT) 0.4 MG SL tablet Place 1 tablet (0.4 mg total) under the tongue every 5 (five) minutes x 3 doses as needed for chest pain.  Allergies:   Patient has no known allergies.   Social History   Socioeconomic History  . Marital status: Single    Spouse name: Not on file  . Number of children: Not on file  . Years of education: Not on file  . Highest education level: Not on file  Occupational History  . Not on file  Tobacco Use  . Smoking status: Former Smoker    Packs/day: 1.00    Years: 22.00    Pack years: 22.00    Types: Cigarettes    Quit date: 01/23/2020    Years since quitting: 0.1  . Smokeless tobacco: Never Used  Vaping Use  . Vaping Use: Never used   Substance and Sexual Activity  . Alcohol use: Yes    Comment: rarely  . Drug use: No  . Sexual activity: Not on file  Other Topics Concern  . Not on file  Social History Narrative  . Not on file   Social Determinants of Health   Financial Resource Strain: Not on file  Food Insecurity: Not on file  Transportation Needs: Not on file  Physical Activity: Not on file  Stress: Not on file  Social Connections: Not on file     Family History: The patient's family history includes Arrhythmia in his mother; Coronary artery disease in his father and mother; Heart failure in his mother.  ROS:   Please see the history of present illness.     All other systems reviewed and are negative.  EKGs/Labs/Other Studies Reviewed:    The following studies were reviewed today:   EKG:  EKG is  ordered today.  The ekg ordered today demonstrates sinus rhythm, heart rate 85 possible old inferior infarct  Recent Labs: 11/25/2019: B Natriuretic Peptide 27.6 01/25/2020: ALT 26; BUN 19; Creatinine, Ser 0.97; Hemoglobin 14.8; Platelets 319; Potassium 4.1; Sodium 137  Recent Lipid Panel    Component Value Date/Time   CHOL 173 01/24/2020 0359   TRIG 260 (H) 01/24/2020 0359   HDL 38 (L) 01/24/2020 0359   CHOLHDL 4.6 01/24/2020 0359   VLDL 52 (H) 01/24/2020 0359   LDLCALC 83 01/24/2020 0359     Risk Assessment/Calculations:      Physical Exam:    VS:  BP 100/70 (BP Location: Left Arm, Patient Position: Sitting, Cuff Size: Normal)   Pulse 85   Ht 6' (1.829 m)   Wt 207 lb 2 oz (94 kg)   SpO2 98%   BMI 28.09 kg/m     Wt Readings from Last 3 Encounters:  03/22/20 207 lb 2 oz (94 kg)  01/30/20 211 lb 2 oz (95.8 kg)  01/25/20 204 lb 12.8 oz (92.9 kg)     GEN:  Well nourished, well developed in no acute distress HEENT: Normal NECK: No JVD; No carotid bruits LYMPHATICS: No lymphadenopathy CARDIAC: RRR, no murmurs, rubs, gallops RESPIRATORY:  Clear to auscultation without rales, wheezing  or rhonchi  ABDOMEN: Soft, non-tender, non-distended MUSCULOSKELETAL:  No edema; No deformity  SKIN: Warm and dry NEUROLOGIC:  Alert and oriented x 3 PSYCHIATRIC:  Normal affect   ASSESSMENT:    1. Coronary artery disease involving native heart, unspecified vessel or lesion type, unspecified whether angina present   2. Primary hypertension   3. Hyperlipidemia LDL goal <70   4. Snoring    PLAN:    In order of problems listed above:  1. NSTEMI, DES to mid LAD.  Occasional chest pain.  Start Imdur 30 mg daily.  Continue aspirin,  Brilinta, Lipitor, Coreg. 2. Hypertension, BP low normal.  Reduce lisinopril to 5 mg daily.  Continue Coreg and Imdur. 3. Hyperlipidemia, high intensity statin 80 mg daily. 4. Patient snores, daytime somnolence.  Symptoms consistent with OSA.  Refer to pulmonary medicine for sleep apnea eval and management if diagnosed.  Follow-up in 6 to 8 weeks      Medication Adjustments/Labs and Tests Ordered: Current medicines are reviewed at length with the patient today.  Concerns regarding medicines are outlined above.  Orders Placed This Encounter  Procedures  . Ambulatory referral to Pulmonology   Meds ordered this encounter  Medications  . nitroGLYCERIN (NITROSTAT) 0.4 MG SL tablet    Sig: Place 1 tablet (0.4 mg total) under the tongue every 5 (five) minutes x 3 doses as needed for chest pain.    Dispense:  25 tablet    Refill:  0  . isosorbide mononitrate (IMDUR) 30 MG 24 hr tablet    Sig: Take 1 tablet (30 mg total) by mouth daily.    Dispense:  30 tablet    Refill:  1  . lisinopril (ZESTRIL) 5 MG tablet    Sig: Take 1 tablet (5 mg total) by mouth daily.    Dispense:  30 tablet    Refill:  1    Patient Instructions  Medication Instructions:   Your physician has recommended you make the following change in your medication:   Decrease your Lisinopril 20 mg to Lisinopril 5 mg daily.  Start Isosorbide (Imdur) 30 mg daily.  *If you need a refill  on your cardiac medications before your next appointment, please call your pharmacy*   Lab Work:   None  If you have labs (blood work) drawn today and your tests are completely normal, you will receive your results only by: Marland Kitchen MyChart Message (if you have MyChart) OR . A paper copy in the mail If you have any lab test that is abnormal or we need to change your treatment, we will call you to review the results.   Testing/Procedures:  None   Follow-Up: At [facility], you and your health needs are our priority.  As part of our continuing mission to provide you with exceptional heart care, we have created designated Provider Care Teams.  These Care Teams include your primary Cardiologist (physician) and Advanced Practice Providers (APPs -  Physician Assistants and Nurse Practitioners) who all work together to provide you with the care you need, when you need it.  We recommend signing up for the patient portal called "MyChart".  Sign up information is provided on this After Visit Summary.  MyChart is used to connect with patients for Virtual Visits (Telemedicine).  Patients are able to view lab/test results, encounter notes, upcoming appointments, etc.  Non-urgent messages can be sent to your provider as well.   To learn more about what you can do with MyChart, go to [email or web address].    Your next appointment:   2 month(s)  The format for your next appointment:   In Person  Provider:   You may see Debbe Odea, MD or one of the following Advanced Practice Providers on your designated Care Team:    Nicolasa Ducking, NP  Eula Listen, PA-C  Marisue Ivan, PA-C  Cadence Troy, [place]  Gillian Shields, NP    Other Instructions       Signed, Debbe Odea, MD  03/22/2020 12:30 PM    [facility]

## 2020-03-23 NOTE — Addendum Note (Signed)
Addended by: Ileene Musa D on: 03/23/2020 11:24 AM   Modules accepted: Orders

## 2020-03-28 ENCOUNTER — Other Ambulatory Visit: Payer: Self-pay

## 2020-03-28 ENCOUNTER — Encounter: Payer: Self-pay | Admitting: Primary Care

## 2020-03-28 ENCOUNTER — Ambulatory Visit (INDEPENDENT_AMBULATORY_CARE_PROVIDER_SITE_OTHER): Payer: Self-pay | Admitting: Primary Care

## 2020-03-28 VITALS — BP 112/72 | HR 94 | Temp 97.1°F | Ht 72.0 in | Wt 208.6 lb

## 2020-03-28 DIAGNOSIS — R0683 Snoring: Secondary | ICD-10-CM

## 2020-03-28 NOTE — Patient Instructions (Addendum)
Untreated sleep apnea puts you at high risk for cardiovascular disease, cardiac arrhythmias, pulmonary hypertension, stroke, diabetes, increased risk for daytime accidents   Treatment options for sleep apnea include weight loss, side sleeping position, oral appliance, CPAP, referral to ENT for possible surgical intervention   Do not drive if experiencing excessive daytime fatigue or somnolence   Orders: Split night sleep study re: snoring     Follow-up: 4 weeks fu televisit (after sleep study)    Sleep Apnea Sleep apnea is a condition in which breathing pauses or becomes shallow during sleep. Episodes of sleep apnea usually last 10 seconds or longer, and they may occur as many as 20 times an hour. Sleep apnea disrupts your sleep and keeps your body from getting the rest that it needs. This condition can increase your risk of certain health problems, including:  Heart attack.  Stroke.  Obesity.  Diabetes.  Heart failure.  Irregular heartbeat. What are the causes? There are three kinds of sleep apnea:  Obstructive sleep apnea. This kind is caused by a blocked or collapsed airway.  Central sleep apnea. This kind happens when the part of the brain that controls breathing does not send the correct signals to the muscles that control breathing.  Mixed sleep apnea. This is a combination of obstructive and central sleep apnea. The most common cause of this condition is a collapsed or blocked airway. An airway can collapse or become blocked if:  Your throat muscles are abnormally relaxed.  Your tongue and tonsils are larger than normal.  You are overweight.  Your airway is smaller than normal.   What increases the risk? You are more likely to develop this condition if you:  Are overweight.  Smoke.  Have a smaller than normal airway.  Are elderly.  Are male.  Drink alcohol.  Take sedatives or tranquilizers.  Have a family history of sleep apnea. What are the  signs or symptoms? Symptoms of this condition include:  Trouble staying asleep.  Daytime sleepiness and tiredness.  Irritability.  Loud snoring.  Morning headaches.  Trouble concentrating.  Forgetfulness.  Decreased interest in sex.  Unexplained sleepiness.  Mood swings.  Personality changes.  Feelings of depression.  Waking up often during the night to urinate.  Dry mouth.  Sore throat. How is this diagnosed? This condition may be diagnosed with:  A medical history.  A physical exam.  A series of tests that are done while you are sleeping (sleep study). These tests are usually done in a sleep lab, but they may also be done at home. How is this treated? Treatment for this condition aims to restore normal breathing and to ease symptoms during sleep. It may involve managing health issues that can affect breathing, such as high blood pressure or obesity. Treatment may include:  Sleeping on your side.  Using a decongestant if you have nasal congestion.  Avoiding the use of depressants, including alcohol, sedatives, and narcotics.  Losing weight if you are overweight.  Making changes to your diet.  Quitting smoking.  Using a device to open your airway while you sleep, such as: ? An oral appliance. This is a custom-made mouthpiece that shifts your lower jaw forward. ? A continuous positive airway pressure (CPAP) device. This device blows air through a mask when you breathe out (exhale). ? A nasal expiratory positive airway pressure (EPAP) device. This device has valves that you put into each nostril. ? A bi-level positive airway pressure (BPAP) device. This device blows air  through a mask when you breathe in (inhale) and breathe out (exhale).  Having surgery if other treatments do not work. During surgery, excess tissue is removed to create a wider airway. It is important to get treatment for sleep apnea. Without treatment, this condition can lead to:  High  blood pressure.  Coronary artery disease.  In men, an inability to achieve or maintain an erection (impotence).  Reduced thinking abilities.   Follow these instructions at home: Lifestyle  Make any lifestyle changes that your health care provider recommends.  Eat a healthy, well-balanced diet.  Take steps to lose weight if you are overweight.  Avoid using depressants, including alcohol, sedatives, and narcotics.  Do not use any products that contain nicotine or tobacco, such as cigarettes, e-cigarettes, and chewing tobacco. If you need help quitting, ask your health care provider. General instructions  Take over-the-counter and prescription medicines only as told by your health care provider.  If you were given a device to open your airway while you sleep, use it only as told by your health care provider.  If you are having surgery, make sure to tell your health care provider you have sleep apnea. You may need to bring your device with you.  Keep all follow-up visits as told by your health care provider. This is important. Contact a health care provider if:  The device that you received to open your airway during sleep is uncomfortable or does not seem to be working.  Your symptoms do not improve.  Your symptoms get worse. Get help right away if:  You develop: ? Chest pain. ? Shortness of breath. ? Discomfort in your back, arms, or stomach.  You have: ? Trouble speaking. ? Weakness on one side of your body. ? Drooping in your face. These symptoms may represent a serious problem that is an emergency. Do not wait to see if the symptoms will go away. Get medical help right away. Call your local emergency services (911 in the [place] Do not drive yourself to the hospital. Summary  Sleep apnea is a condition in which breathing pauses or becomes shallow during sleep.  The most common cause is a collapsed or blocked airway.  The goal of treatment is to restore normal  breathing and to ease symptoms during sleep. This information is not intended to replace advice given to you by your health care provider. Make sure you discuss any questions you have with your health care provider. Document Revised: 07/28/2018 Document Reviewed: 10/06/2017 Elsevier Patient Education  2021 [employer].

## 2020-03-28 NOTE — Assessment & Plan Note (Signed)
-   Patient reports symptoms of loud snoring, choking/gasping for air, restless sleep, daytime fatigue. Epworth 22/24. I have a very strong suspicion patient has underlying sleep apnea, he needs split night sleep study to evaluate d/t severity of his symptoms.  - Discussed with patient that untreated sleep apnea puts patient at a higher risk for cardiovascular disease, cardiac arrhythmias, pulmonary hypertension, diabetes, stroke and increase in daytime accidents. We reviewed treatment options including weight loss, side sleeping position, oral appliance, CPAP or referral to ENT for possible surgical interventions - Advised patient not to drive if experiencing excessive daytime fatigue or somnolence - FU in 4 weeks after sleep study to review result and treatment options

## 2020-03-28 NOTE — Progress Notes (Signed)
@Patient  ID: , male    DOB: Jan 15, 1976, 45 y.o.   MRN: [id number]  Chief Complaint  Patient presents with  . sleep consult    No prior sleep study. C/o daytime sleepiness, loud snoring and chocking during sleep.     Referring provider: [id number], MD  HPI: 45 year old male, former smoker quit in November 2021. PMH significant for HTN, STEMI, GERD, hyperlipidemia, covid-19 infection, overweight.   03/28/2020 Patient presents today for sleep consult, referred by cardiology. Patient had NSTEMI in November 2021. He reports symptoms of loud snoring, choking/gasping for air at night, restless sleep and daytime fatigue. He is not currently working. He has never had a previous sleep study. He wakes up on average 10-15 times a night. His weight is down 20 lbs in the last two years. He is not on oxygen. No symptoms of sleep walking/talking, sleep paralysis, cataplexy, narcolepsy.  Sleep Questionnaire Symptoms: Loud snoring, choking/gasping for air, restless sleep, daytime fatigue Previous sleep study: None Typical bedtime: 9 pm- 12am Length of time to fall asleep: Depends Nocturnal awakenings: 10-15 times  Out of bed in the morning: Varies  Epworth Score: 22/24  No Known Allergies  Immunization History  Administered Date(s) Administered  . Pneumococcal Polysaccharide-23 06/12/2016    Past Medical History:  Diagnosis Date  . CAD (coronary artery disease)   . Hyperlipidemia   . Hypertension   . Tobacco use 11/25/2019    Tobacco History: Social History   Tobacco Use  Smoking Status Former Smoker  . Packs/day: 1.00  . Years: 22.00  . Pack years: 22.00  . Types: Cigarettes  . Quit date: 01/23/2020  . Years since quitting: 0.1  Smokeless Tobacco Never Used   Counseling given: Not Answered   Outpatient Medications Prior to Visit  Medication Sig Dispense Refill  . amLODipine (NORVASC) 10 MG tablet Take by mouth.    01/25/2020 aspirin EC 81 MG EC tablet Take 1  tablet (81 mg total) by mouth daily. Swallow whole. 30 tablet 0  . atorvastatin (LIPITOR) 80 MG tablet Take 1 tablet (80 mg total) by mouth daily. 90 tablet 1  . carvedilol (COREG) 6.25 MG tablet Take 1 tablet (6.25 mg total) by mouth 2 (two) times daily with a meal. 180 tablet 1  . isosorbide mononitrate (IMDUR) 30 MG 24 hr tablet Take 1 tablet (30 mg total) by mouth daily. 30 tablet 1  . lisinopril (ZESTRIL) 5 MG tablet Take 1 tablet (5 mg total) by mouth daily. 30 tablet 1  . Multiple Vitamin (MULTIVITAMIN) tablet Take 1 tablet by mouth daily.    . Niacin (VITAMIN B-3 PO) Take by mouth daily.    . nitroGLYCERIN (NITROSTAT) 0.4 MG SL tablet Place 1 tablet (0.4 mg total) under the tongue every 5 (five) minutes x 3 doses as needed for chest pain. 25 tablet 0  . pantoprazole (PROTONIX) 40 MG tablet Take 1 tablet (40 mg total) by mouth daily. 30 tablet 0  . ticagrelor (BRILINTA) 90 MG TABS tablet Take 1 tablet (90 mg total) by mouth every 12 (twelve) hours. 180 tablet 1  . albuterol (VENTOLIN HFA) 108 (90 Base) MCG/ACT inhaler Inhale 2 puffs into the lungs every 6 (six) hours as needed for wheezing or shortness of breath. 1 each 0  . sertraline (ZOLOFT) 50 MG tablet Take 1 tablet (50 mg total) by mouth daily. (Patient not taking: Reported on 03/28/2020) 30 tablet 0   No facility-administered medications prior to visit.  Review of Systems  Review of Systems  Constitutional: Positive for fatigue.  Psychiatric/Behavioral: Positive for sleep disturbance.   Physical Exam  BP 112/72 (BP Location: Left Arm, Cuff Size: Normal)   Pulse 94   Temp (!) 97.1 F (36.2 C) (Temporal)   Ht 6' (1.829 m)   Wt 208 lb 9.6 oz (94.6 kg)   SpO2 98%   BMI 28.29 kg/m  Physical Exam Constitutional:      Appearance: Normal appearance.  HENT:     Head: Normocephalic and atraumatic.     Mouth/Throat:     Comments: Deferred d/t masking Cardiovascular:     Rate and Rhythm: Normal rate and regular rhythm.      Heart sounds: No murmur heard.   Pulmonary:     Effort: Pulmonary effort is normal.     Breath sounds: Normal breath sounds. No wheezing or rhonchi.  Musculoskeletal:        General: Normal range of motion.  Skin:    General: Skin is warm and dry.  Neurological:     General: No focal deficit present.     Mental Status: He is alert and oriented to person, place, and time. Mental status is at baseline.  Psychiatric:        Mood and Affect: Mood normal.        Behavior: Behavior normal.        Thought Content: Thought content normal.        Judgment: Judgment normal.      Lab Results:  CBC    Component Value Date/Time   WBC 9.4 01/25/2020 0513   RBC 4.74 01/25/2020 0513   HGB 14.8 01/25/2020 0513   HGB 16.3 10/03/2013 1306   HCT 41.5 01/25/2020 0513   HCT 48.3 10/03/2013 1306   PLT 319 01/25/2020 0513   PLT 239 10/03/2013 1306   MCV 87.6 01/25/2020 0513   MCV 95 10/03/2013 1306   MCH 31.2 01/25/2020 0513   MCHC 35.7 01/25/2020 0513   RDW 13.3 01/25/2020 0513   RDW 12.5 10/03/2013 1306   LYMPHSABS 2.5 01/23/2020 2325   LYMPHSABS 2.0 10/03/2013 1306   MONOABS 0.6 01/23/2020 2325   MONOABS 0.9 10/03/2013 1306   EOSABS 0.6 (H) 01/23/2020 2325   EOSABS 0.5 10/03/2013 1306   BASOSABS 0.1 01/23/2020 2325   BASOSABS 0.1 10/03/2013 1306    BMET    Component Value Date/Time   NA 137 01/25/2020 0513   NA 140 10/03/2013 1306   K 4.1 01/25/2020 0513   K 3.8 10/03/2013 1306   CL 103 01/25/2020 0513   CL 106 10/03/2013 1306   CO2 23 01/25/2020 0513   CO2 26 10/03/2013 1306   GLUCOSE 97 01/25/2020 0513   GLUCOSE 80 10/03/2013 1306   BUN 19 01/25/2020 0513   BUN 12 10/03/2013 1306   CREATININE 0.97 01/25/2020 0513   CREATININE 1.39 (H) 10/03/2013 1306   CALCIUM 9.0 01/25/2020 0513   CALCIUM 8.4 (L) 10/03/2013 1306   GFRNONAA >60 01/25/2020 0513   GFRNONAA >60 10/03/2013 1306   GFRAA >60 11/25/2019 1207   GFRAA >60 10/03/2013 1306    BNP    Component Value  Date/Time   BNP 27.6 11/25/2019 1904    ProBNP No results found for: PROBNP  Imaging: No results found.   Assessment & Plan:   Loud snoring - Patient reports symptoms of loud snoring, choking/gasping for air, restless sleep, daytime fatigue. Epworth 22/24. I have a very strong suspicion patient has underlying  sleep apnea, he needs split night sleep study to evaluate d/t severity of his symptoms.  - Discussed with patient that untreated sleep apnea puts patient at a higher risk for cardiovascular disease, cardiac arrhythmias, pulmonary hypertension, diabetes, stroke and increase in daytime accidents. We reviewed treatment options including weight loss, side sleeping position, oral appliance, CPAP or referral to ENT for possible surgical interventions - Advised patient not to drive if experiencing excessive daytime fatigue or somnolence - FU in 4 weeks after sleep study to review result and treatment options   Glenford Bayley, NP 03/28/2020

## 2020-03-29 NOTE — Progress Notes (Signed)
Reviewed and agree with assessment/plan.   Coralyn Helling, MD [facility] 03/29/2020, 8:52 AM Pager:  [phone]

## 2020-04-24 ENCOUNTER — Telehealth: Payer: Self-pay

## 2020-04-24 NOTE — Telephone Encounter (Signed)
Called patient to see if sleep study was scheduled yet, patient says he has not had the chance to schedule that appointment yet. Let patient know we will cancel the appointment for tomorrow and to call the office back once the sleep study is completed and we will set up an appointment 2 weeks after the test is completed.

## 2020-04-25 ENCOUNTER — Ambulatory Visit: Payer: Self-pay | Admitting: Primary Care

## 2020-04-25 ENCOUNTER — Other Ambulatory Visit: Payer: Self-pay

## 2020-05-17 ENCOUNTER — Ambulatory Visit: Payer: Self-pay | Admitting: Cardiology

## 2020-05-18 ENCOUNTER — Encounter: Payer: Self-pay | Admitting: Cardiology

## 2020-05-18 ENCOUNTER — Other Ambulatory Visit: Payer: Self-pay

## 2020-05-18 ENCOUNTER — Ambulatory Visit (INDEPENDENT_AMBULATORY_CARE_PROVIDER_SITE_OTHER): Payer: Self-pay | Admitting: Cardiology

## 2020-05-18 VITALS — BP 130/84 | HR 83 | Ht 72.0 in | Wt 206.0 lb

## 2020-05-18 DIAGNOSIS — I10 Essential (primary) hypertension: Secondary | ICD-10-CM

## 2020-05-18 DIAGNOSIS — E785 Hyperlipidemia, unspecified: Secondary | ICD-10-CM

## 2020-05-18 DIAGNOSIS — I2510 Atherosclerotic heart disease of native coronary artery without angina pectoris: Secondary | ICD-10-CM

## 2020-05-18 DIAGNOSIS — R0683 Snoring: Secondary | ICD-10-CM

## 2020-05-18 MED ORDER — CARVEDILOL 6.25 MG PO TABS: 6.2500 mg | ORAL_TABLET | Freq: Two times a day (BID) | ORAL | 1 refills | Status: DC

## 2020-05-18 MED ORDER — AMLODIPINE BESYLATE 10 MG PO TABS: 10.0000 mg | ORAL_TABLET | Freq: Every day | ORAL | 3 refills | Status: DC

## 2020-05-18 MED ORDER — ISOSORBIDE MONONITRATE ER 60 MG PO TB24: 60.0000 mg | ORAL_TABLET | Freq: Every day | ORAL | 3 refills | Status: DC

## 2020-05-18 MED ORDER — ATORVASTATIN CALCIUM 80 MG PO TABS: 80.0000 mg | ORAL_TABLET | Freq: Every day | ORAL | 1 refills | Status: DC

## 2020-05-18 NOTE — Progress Notes (Signed)
Cardiology Office Note:    Date:  05/18/2020   ID:  Logan Crawford, DOB 12-17-1975, MRN [id number]  PCP:  Aviva KluverPcp, No  [facility] Cardiologist:  Debbe OdeaBrian Agbor-Etang, MD  [facility] Electrophysiologist:  None   Referring MD: No ref. provider found   Chief Complaint  Patient presents with  . Other    2 month follow up - Patient c.o chest pain yesterday - Took 4 nitros. Patient denies active chest pain at this time but is breathing heavy. Patient states he has not taken his lisinopril in 2 days and has not taken any medications today. Meds reviewed verbally with patient.     History of Present Illness:    Logan Crawford is a 45 y.o. male with a hx of history of CAD/NSTEMI (12/2019 s/p DES to mLAD), hypertension, hyperlipidemia, former smoker x25+ years presenting for follow-up.  Previously seen with symptoms of chest discomfort, snoring, daytime somnolence and fatigue.  Imdur 30 mg daily was started for antianginal benefit.  He states still having symptoms of chest discomfort despite starting Imdur.  Occasionally takes nitroglycerin pills which help.  Has not taken lisinopril over the past 2 days.  Was also referred to pulmonary medicine for sleep eval evaluation.  He saw pulmonary medicine on 03/28/2020, there is some suspicion for OSA.  Sleep study and treatment options if diagnosed with OSA being planned by pulmonary medicine.    Prior notes Echo 11/30, EF 50% hypokinesis of the mid to apical anteroseptal wall. Left heart cath 12/2019 severe one-vessel CAD 90% mid lad stenosis, s/p DES to mid LAD.  Proximal RCA 60%, mid RCA 40%.  Past Medical History:  Diagnosis Date  . CAD (coronary artery disease)   . Hyperlipidemia   . Hypertension   . Tobacco use 11/25/2019    Past Surgical History:  Procedure Laterality Date  . CORONARY STENT INTERVENTION N/A 01/24/2020   Procedure: CORONARY STENT INTERVENTION;  Surgeon: Iran OuchArida, Muhammad A, MD;  Location: [facility] INVASIVE CV LAB;   Service: Cardiovascular;  Laterality: N/A;  . IRRIGATION AND DEBRIDEMENT KNEE Left   . LEFT HEART CATH AND CORONARY ANGIOGRAPHY N/A 01/24/2020   Procedure: LEFT HEART CATH AND CORONARY ANGIOGRAPHY;  Surgeon: Iran OuchArida, Muhammad A, MD;  Location: [facility] INVASIVE CV LAB;  Service: Cardiovascular;  Laterality: N/A;  . TYMPANOSTOMY TUBE PLACEMENT Bilateral     Current Medications: Current Meds  Medication Sig  . aspirin EC 81 MG EC tablet Take 1 tablet (81 mg total) by mouth daily. Swallow whole.  . Multiple Vitamin (MULTIVITAMIN) tablet Take 1 tablet by mouth daily.  . Niacin (VITAMIN B-3 PO) Take by mouth daily.  . nitroGLYCERIN (NITROSTAT) 0.4 MG SL tablet Place 1 tablet (0.4 mg total) under the tongue every 5 (five) minutes x 3 doses as needed for chest pain.  . pantoprazole (PROTONIX) 40 MG tablet Take 1 tablet (40 mg total) by mouth daily.  . sertraline (ZOLOFT) 50 MG tablet Take 1 tablet (50 mg total) by mouth daily.  . ticagrelor (BRILINTA) 90 MG TABS tablet Take 1 tablet (90 mg total) by mouth every 12 (twelve) hours.  . [DISCONTINUED] amLODipine (NORVASC) 10 MG tablet Take by mouth.  . [DISCONTINUED] atorvastatin (LIPITOR) 80 MG tablet Take 1 tablet (80 mg total) by mouth daily.  . [DISCONTINUED] carvedilol (COREG) 6.25 MG tablet Take 1 tablet (6.25 mg total) by mouth 2 (two) times daily with a meal.  . [DISCONTINUED] isosorbide mononitrate (IMDUR) 30 MG 24 hr tablet Take 1 tablet (30  mg total) by mouth daily.  . [DISCONTINUED] lisinopril (ZESTRIL) 5 MG tablet Take 1 tablet (5 mg total) by mouth daily.     Allergies:   Patient has no known allergies.   Social History   Socioeconomic History  . Marital status: Single    Spouse name: Not on file  . Number of children: Not on file  . Years of education: Not on file  . Highest education level: Not on file  Occupational History  . Not on file  Tobacco Use  . Smoking status: Former Smoker    Packs/day: 1.00    Years: 22.00    Pack  years: 22.00    Types: Cigarettes    Quit date: 01/23/2020    Years since quitting: 0.3  . Smokeless tobacco: Never Used  Vaping Use  . Vaping Use: Never used  Substance and Sexual Activity  . Alcohol use: Yes    Comment: rarely  . Drug use: No  . Sexual activity: Not on file  Other Topics Concern  . Not on file  Social History Narrative  . Not on file   Social Determinants of Health   Financial Resource Strain: Not on file  Food Insecurity: Not on file  Transportation Needs: Not on file  Physical Activity: Not on file  Stress: Not on file  Social Connections: Not on file     Family History: The patient's family history includes Arrhythmia in his mother; Coronary artery disease in his father and mother; Heart failure in his mother.  ROS:   Please see the history of present illness.     All other systems reviewed and are negative.  EKGs/Labs/Other Studies Reviewed:    The following studies were reviewed today:   EKG:  EKG is  ordered today.  The ekg ordered today demonstrates sinus rhythm, possible old inferior infarct  Recent Labs: 11/25/2019: B Natriuretic Peptide 27.6 01/25/2020: ALT 26; BUN 19; Creatinine, Ser 0.97; Hemoglobin 14.8; Platelets 319; Potassium 4.1; Sodium 137  Recent Lipid Panel    Component Value Date/Time   CHOL 173 01/24/2020 0359   TRIG 260 (H) 01/24/2020 0359   HDL 38 (L) 01/24/2020 0359   CHOLHDL 4.6 01/24/2020 0359   VLDL 52 (H) 01/24/2020 0359   LDLCALC 83 01/24/2020 0359     Risk Assessment/Calculations:      Physical Exam:    VS:  BP 130/84 (BP Location: Left Arm, Patient Position: Sitting, Cuff Size: Normal)   Pulse 83   Ht 6' (1.829 m)   Wt 206 lb (93.4 kg)   SpO2 98%   BMI 27.94 kg/m     Wt Readings from Last 3 Encounters:  05/18/20 206 lb (93.4 kg)  03/28/20 208 lb 9.6 oz (94.6 kg)  03/22/20 207 lb 2 oz (94 kg)     GEN:  Well nourished, well developed in no acute distress HEENT: Normal NECK: No JVD; No carotid  bruits LYMPHATICS: No lymphadenopathy CARDIAC: RRR, no murmurs, rubs, gallops RESPIRATORY:  Clear to auscultation without rales, wheezing or rhonchi  ABDOMEN: Soft, non-tender, non-distended MUSCULOSKELETAL:  No edema; No deformity  SKIN: Warm and dry NEUROLOGIC:  Alert and oriented x 3 PSYCHIATRIC:  Normal affect   ASSESSMENT:    1. Coronary artery disease involving native heart, unspecified vessel or lesion type, unspecified whether angina present   2. Primary hypertension   3. Hyperlipidemia LDL goal <70   4. Snoring    PLAN:    In order of problems listed above:  1. NSTEMI, DES to mid LAD on 12/2019.  Still with occasional chest pain.  Increase Imdur to 30 mg daily.  Continue aspirin, Brilinta, Lipitor, Coreg.  Get a [employer].  If BP is elevated, will plan to increase Coreg for additional antiangina benefit and BP benefit. 2. Hypertension, BP controlled .  Continue Coreg, Imdur, amlodipine. 3. Hyperlipidemia, on high intensity statin 80 mg daily. 4. Snoring, daytime somnolence.  High suspicion for sleep apnea.  Testing and management as per pulmonary medicine.  Follow-up in 1 month.  Shared Decision Making/Informed Consent The risks [chest pain, shortness of breath, cardiac arrhythmias, dizziness, blood pressure fluctuations, myocardial infarction, stroke/transient ischemic attack, nausea, vomiting, allergic reaction, radiation exposure, metallic taste sensation and life-threatening complications (estimated to be 1 in 10,000)], benefits (risk stratification, diagnosing coronary artery disease, treatment guidance) and alternatives of a nuclear stress test were discussed in detail with Mr. Lave and he agrees to proceed.   Medication Adjustments/Labs and Tests Ordered: Current medicines are reviewed at length with the patient today.  Concerns regarding medicines are outlined above.  Orders Placed This Encounter  Procedures  . NM Myocar Multi W/Spect W/Wall Motion / EF  .  EKG 12-Lead   Meds ordered this encounter  Medications  . isosorbide mononitrate (IMDUR) 60 MG 24 hr tablet    Sig: Take 1 tablet (60 mg total) by mouth daily.    Dispense:  30 tablet    Refill:  3  . carvedilol (COREG) 6.25 MG tablet    Sig: Take 1 tablet (6.25 mg total) by mouth 2 (two) times daily with a meal.    Dispense:  180 tablet    Refill:  1  . atorvastatin (LIPITOR) 80 MG tablet    Sig: Take 1 tablet (80 mg total) by mouth daily.    Dispense:  90 tablet    Refill:  1  . amLODipine (NORVASC) 10 MG tablet    Sig: Take 1 tablet (10 mg total) by mouth daily.    Dispense:  90 tablet    Refill:  3    Patient Instructions  Medication Instructions:   Your physician has recommended you make the following change in your medication:   1.  STOP taking you Lisinopril.  2.  INCREASE your IMDUR to 60 MG once daily.  *If you need a refill on your cardiac medications before your next appointment, please call your pharmacy*   Lab Work: None ordered If you have labs (blood work) drawn today and your tests are completely normal, you will receive your results only by: Marland Kitchen MyChart Message (if you have MyChart) OR . A paper copy in the mail If you have any lab test that is abnormal or we need to change your treatment, we will call you to review the results.   Testing/Procedures:   [facility] MYOVIEW       Your caregiver has ordered a Stress Test with nuclear imaging for next week. The purpose of this test is to evaluate the blood supply to your heart muscle. This procedure is referred to as a "Non-Invasive Stress Test." This is because other than having an IV started in your vein, nothing is inserted or "invades" your body. Cardiac stress tests are done to find areas of poor blood flow to the heart by determining the extent of coronary artery disease (CAD). Some patients exercise on a treadmill, which naturally increases the blood flow to your heart, while others who are  unable to walk  on a  treadmill due to physical limitations have a pharmacologic/chemical stress agent called Lexiscan . This medicine will mimic walking on a treadmill by temporarily increasing your coronary blood flow.      PLEASE REPORT TO [facility] MEDICAL MALL ENTRANCE   THE VOLUNTEERS AT THE FIRST DESK WILL DIRECT YOU WHERE TO GO     *Please note: these test may take anywhere between 2-4 hours to complete       Date of Procedure:_____________________________________   Arrival Time for Procedure:______________________________    PLEASE NOTIFY THE OFFICE AT LEAST 24 HOURS IN ADVANCE IF YOU ARE UNABLE TO KEEP YOUR APPOINTMENT.  [phone]  PLEASE NOTIFY NUCLEAR MEDICINE AT [facility] AT LEAST 24 HOURS IN ADVANCE IF YOU ARE UNABLE TO KEEP YOUR APPOINTMENT. [phone]         How to prepare for your Myoview test:       _XX___:  Hold betablocker(s) night before procedure and morning of procedure:   carvedilol (COREG)    1. Do not eat or drink after midnight  2. No caffeine for 24 hours prior to test  3. No smoking 24 hours prior to test.  4. Unless instructed otherwise, Take your medication with a small sips of water.    5. No perfume, cologne or lotion.  6. Wear comfortable walking shoes. No heels!     Follow-Up: At [facility], you and your health needs are our priority.  As part of our continuing mission to provide you with exceptional heart care, we have created designated Provider Care Teams.  These Care Teams include your primary Cardiologist (physician) and Advanced Practice Providers (APPs -  Physician Assistants and Nurse Practitioners) who all work together to provide you with the care you need, when you need it.  We recommend signing up for the patient portal called "MyChart".  Sign up information is provided on this After Visit Summary.  MyChart is used to connect with patients for Virtual Visits (Telemedicine).  Patients are able to view lab/test results, encounter notes, upcoming  appointments, etc.  Non-urgent messages can be sent to your provider as well.   To learn more about what you can do with MyChart, go to [email or web address].    Your next appointment:   1 month(s)  The format for your next appointment:   In Person  Provider:   Debbe Odea, MD   Other Instructions      Signed, Debbe Odea, MD  05/18/2020 5:10 PM    [facility]

## 2020-05-18 NOTE — Patient Instructions (Signed)
Medication Instructions:   Your physician has recommended you make the following change in your medication:   1.  STOP taking you Lisinopril.  2.  INCREASE your IMDUR to 60 MG once daily.  *If you need a refill on your cardiac medications before your next appointment, please call your pharmacy*   Lab Work: None ordered If you have labs (blood work) drawn today and your tests are completely normal, you will receive your results only by: Marland Kitchen MyChart Message (if you have MyChart) OR . A paper copy in the mail If you have any lab test that is abnormal or we need to change your treatment, we will call you to review the results.   Testing/Procedures:   [facility] MYOVIEW       Your caregiver has ordered a Stress Test with nuclear imaging for next week. The purpose of this test is to evaluate the blood supply to your heart muscle. This procedure is referred to as a "Non-Invasive Stress Test." This is because other than having an IV started in your vein, nothing is inserted or "invades" your body. Cardiac stress tests are done to find areas of poor blood flow to the heart by determining the extent of coronary artery disease (CAD). Some patients exercise on a treadmill, which naturally increases the blood flow to your heart, while others who are  unable to walk on a treadmill due to physical limitations have a pharmacologic/chemical stress agent called Lexiscan . This medicine will mimic walking on a treadmill by temporarily increasing your coronary blood flow.      PLEASE REPORT TO [facility] MEDICAL MALL ENTRANCE   THE VOLUNTEERS AT THE FIRST DESK WILL DIRECT YOU WHERE TO GO     *Please note: these test may take anywhere between 2-4 hours to complete       Date of Procedure:_____________________________________   Arrival Time for Procedure:______________________________    PLEASE NOTIFY THE OFFICE AT LEAST 24 HOURS IN ADVANCE IF YOU ARE UNABLE TO KEEP YOUR APPOINTMENT.  [phone]  PLEASE  NOTIFY NUCLEAR MEDICINE AT [facility] AT LEAST 24 HOURS IN ADVANCE IF YOU ARE UNABLE TO KEEP YOUR APPOINTMENT. [phone]         How to prepare for your Myoview test:       _XX___:  Hold betablocker(s) night before procedure and morning of procedure:   carvedilol (COREG)    1. Do not eat or drink after midnight  2. No caffeine for 24 hours prior to test  3. No smoking 24 hours prior to test.  4. Unless instructed otherwise, Take your medication with a small sips of water.    5. No perfume, cologne or lotion.  6. Wear comfortable walking shoes. No heels!     Follow-Up: At [facility], you and your health needs are our priority.  As part of our continuing mission to provide you with exceptional heart care, we have created designated Provider Care Teams.  These Care Teams include your primary Cardiologist (physician) and Advanced Practice Providers (APPs -  Physician Assistants and Nurse Practitioners) who all work together to provide you with the care you need, when you need it.  We recommend signing up for the patient portal called "MyChart".  Sign up information is provided on this After Visit Summary.  MyChart is used to connect with patients for Virtual Visits (Telemedicine).  Patients are able to view lab/test results, encounter notes, upcoming appointments, etc.  Non-urgent messages can be sent to your provider as well.  To learn more about what you can do with MyChart, go to [email or web address].    Your next appointment:   1 month(s)  The format for your next appointment:   In Person  Provider:   Kate Sable, MD   Other Instructions

## 2020-05-23 ENCOUNTER — Other Ambulatory Visit: Payer: Self-pay

## 2020-05-23 ENCOUNTER — Encounter: Admission: RE | Admit: 2020-05-23 | Payer: Self-pay | Source: Ambulatory Visit

## 2020-06-21 ENCOUNTER — Telehealth: Payer: Self-pay

## 2020-06-21 ENCOUNTER — Encounter: Payer: Self-pay | Admitting: Cardiology

## 2020-06-21 NOTE — Telephone Encounter (Signed)
-----   Message from Andi Devon sent at 06/21/2020  7:59 AM EDT ----- Regarding: no show  ----- Message ----- From: Thayer Headings, Brandy L Sent: 06/19/2020   1:05 PM EDT To: Mickie Bail Burl Scheduling Subject: no show                                        Patient did not show for myoview-patient scheduled for F/U appointment with Dr. Azucena Cecil on 4/29. Please reschedule after testing. Thank you!

## 2020-06-21 NOTE — Telephone Encounter (Signed)
This encounter was created in error - please disregard.

## 2020-06-21 NOTE — Telephone Encounter (Signed)
Patient needs to cancel appointment for 4/29 due to no show for his lexiscan myoview. Unable to leave message, due to no vm set up. Patient needs to schedule lexiscan and follow up afterwards with Agbor-Etang

## 2020-06-22 ENCOUNTER — Ambulatory Visit: Payer: Self-pay | Admitting: Cardiology

## 2020-06-22 NOTE — Telephone Encounter (Signed)
Attempted to reschedule.  No ans no vm.  Cancelling visit . Will fu .

## 2020-06-27 ENCOUNTER — Other Ambulatory Visit: Payer: Self-pay

## 2020-06-27 MED ORDER — NITROGLYCERIN 0.4 MG SL SUBL: 0.4000 mg | SUBLINGUAL_TABLET | SUBLINGUAL | 0 refills | Status: DC | PRN

## 2020-08-06 ENCOUNTER — Other Ambulatory Visit: Payer: Self-pay

## 2020-08-06 ENCOUNTER — Emergency Department
Admission: EM | Admit: 2020-08-06 | Discharge: 2020-08-06 | Disposition: A | Discharge status: Home / Self Care | Payer: Self-pay | Attending: Emergency Medicine | Admitting: Emergency Medicine

## 2020-08-06 ENCOUNTER — Emergency Department: Payer: Self-pay

## 2020-08-06 DIAGNOSIS — I2510 Atherosclerotic heart disease of native coronary artery without angina pectoris: Secondary | ICD-10-CM | POA: Insufficient documentation

## 2020-08-06 DIAGNOSIS — R079 Chest pain, unspecified: Secondary | ICD-10-CM

## 2020-08-06 DIAGNOSIS — Z8616 Personal history of COVID-19: Secondary | ICD-10-CM | POA: Insufficient documentation

## 2020-08-06 DIAGNOSIS — Z7982 Long term (current) use of aspirin: Secondary | ICD-10-CM | POA: Insufficient documentation

## 2020-08-06 DIAGNOSIS — R531 Weakness: Secondary | ICD-10-CM | POA: Insufficient documentation

## 2020-08-06 DIAGNOSIS — Z87891 Personal history of nicotine dependence: Secondary | ICD-10-CM | POA: Insufficient documentation

## 2020-08-06 DIAGNOSIS — Z79899 Other long term (current) drug therapy: Secondary | ICD-10-CM | POA: Insufficient documentation

## 2020-08-06 DIAGNOSIS — R21 Rash and other nonspecific skin eruption: Secondary | ICD-10-CM | POA: Insufficient documentation

## 2020-08-06 DIAGNOSIS — Z20822 Contact with and (suspected) exposure to covid-19: Secondary | ICD-10-CM | POA: Insufficient documentation

## 2020-08-06 DIAGNOSIS — Z955 Presence of coronary angioplasty implant and graft: Secondary | ICD-10-CM | POA: Insufficient documentation

## 2020-08-06 DIAGNOSIS — R55 Syncope and collapse: Secondary | ICD-10-CM | POA: Insufficient documentation

## 2020-08-06 DIAGNOSIS — I10 Essential (primary) hypertension: Secondary | ICD-10-CM | POA: Insufficient documentation

## 2020-08-06 DIAGNOSIS — R0789 Other chest pain: Secondary | ICD-10-CM | POA: Insufficient documentation

## 2020-08-06 LAB — TROPONIN I (HIGH SENSITIVITY)
Troponin I (High Sensitivity): 18 ng/L — ABNORMAL HIGH (ref <18)
Troponin I (High Sensitivity): 17 ng/L (ref <18)

## 2020-08-06 LAB — BASIC METABOLIC PANEL
Anion gap: 8 (ref 5–15)
BUN: 20 mg/dL (ref 6–20)
CO2: 27 mmol/L (ref 22–32)
Calcium: 9.5 mg/dL (ref 8.9–10.3)
Chloride: 101 mmol/L (ref 98–111)
Creatinine, Ser: 1.62 mg/dL — ABNORMAL HIGH (ref 0.61–1.24)
GFR, Estimated: 53 mL/min — ABNORMAL LOW (ref >60)
Glucose, Bld: 103 mg/dL — ABNORMAL HIGH (ref 70–99)
Potassium: 3.7 mmol/L (ref 3.5–5.1)
Sodium: 136 mmol/L (ref 135–145)

## 2020-08-06 LAB — CBC
HCT: 41.9 % (ref 39.0–52.0)
Hemoglobin: 14.9 g/dL (ref 13.0–17.0)
MCH: 31.2 pg (ref 26.0–34.0)
MCHC: 35.6 g/dL (ref 30.0–36.0)
MCV: 87.7 fL (ref 80.0–100.0)
Platelets: 331 10*3/uL (ref 150–400)
RBC: 4.78 MIL/uL (ref 4.22–5.81)
RDW: 14.2 % (ref 11.5–15.5)
WBC: 9.8 10*3/uL (ref 4.0–10.5)
nRBC: 0 % (ref 0.0–0.2)

## 2020-08-06 LAB — RESP PANEL BY RT-PCR (FLU A&B, COVID) ARPGX2
Influenza A by PCR: NEGATIVE
Influenza B by PCR: NEGATIVE
SARS Coronavirus 2 by RT PCR: NEGATIVE

## 2020-08-06 MED ORDER — PERMETHRIN 5 % EX CREA: 1.0000 "application " | TOPICAL_CREAM | Freq: Once | CUTANEOUS | 0 refills | Status: AC

## 2020-08-06 MED ORDER — SODIUM CHLORIDE 0.9 % IV BOLUS
1000.0000 mL | Freq: Once | INTRAVENOUS | Status: AC
  Administered 2020-08-06: 1000 mL via INTRAVENOUS

## 2020-08-06 NOTE — ED Provider Notes (Signed)
[facility] Emergency Department Provider Note  Time seen: 6:21 PM  I have reviewed the triage vital signs and the nursing notes.   HISTORY  Chief Complaint Chest Pain   HPI Logan Crawford is a 45 y.o. male with a past medical history of CAD, hypertension, MI with stent 11/21, presents to the emergency department after near syncopal episode and chest discomfort.  According to the patient he was outside working in the heat today where he had been feeling very weak and had a near syncopal event.  Also states throughout the day he has been experiencing some mild chest discomfort although denies any currently.  Patient states a history of a stent in November this past year.  As a secondary complaint patient states he has been noticing a rash over his body little small brown patches that itch over the past several months as well.  No known bites.    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   Penile lesion 11/25/2019   Essential hypertension    Hyperlipidemia    Overweight with body mass index (BMI) of 27 to 27.9 in adult     Past Surgical History:  Procedure Laterality Date   CORONARY STENT INTERVENTION N/A 01/24/2020   Procedure: CORONARY STENT INTERVENTION;  Surgeon: Iran Ouch, MD;  Location: [facility] INVASIVE CV LAB;  Service: Cardiovascular;  Laterality: N/A;   IRRIGATION AND DEBRIDEMENT KNEE Left    LEFT HEART CATH AND CORONARY ANGIOGRAPHY N/A 01/24/2020   Procedure: LEFT HEART CATH AND CORONARY  ANGIOGRAPHY;  Surgeon: Iran Ouch, MD;  Location: [facility] INVASIVE CV LAB;  Service: Cardiovascular;  Laterality: N/A;   TYMPANOSTOMY TUBE PLACEMENT Bilateral     Prior to Admission medications   Medication Sig Start Date End Date Taking? Authorizing Provider  albuterol (VENTOLIN HFA) 108 (90 Base) MCG/ACT inhaler Inhale 2 puffs into the lungs every 6 (six) hours as needed for wheezing or shortness of breath. 11/27/19 02/25/20  [place], Alvira Philips, DO  amLODipine (NORVASC) 10 MG tablet Take 1 tablet (10 mg total) by mouth daily. 05/18/20   Debbe Odea, MD  aspirin EC 81 MG EC tablet Take 1 tablet (81 mg total) by mouth daily. Swallow whole. 01/26/20   Alford Highland, MD  atorvastatin (LIPITOR) 80 MG tablet Take 1 tablet (80 mg total) by mouth daily. 05/18/20   Debbe Odea, MD  carvedilol (COREG) 6.25 MG tablet Take 1 tablet (6.25 mg total) by mouth 2 (two) times daily with a meal. 05/18/20   Debbe Odea, MD  isosorbide mononitrate (IMDUR) 60 MG 24 hr tablet Take 1 tablet (60 mg total) by mouth daily. 05/18/20 08/16/20  Debbe Odea, MD  Multiple Vitamin (MULTIVITAMIN) tablet Take 1 tablet by mouth daily.    [provider]  Niacin (VITAMIN B-3 PO) Take by mouth daily.    [provider]  nitroGLYCERIN (NITROSTAT) 0.4 MG SL tablet Place 1 tablet (0.4 mg total) under the tongue every 5 (five) minutes x 3 doses as  needed for chest pain. 06/27/20   Debbe Odea, MD  pantoprazole (PROTONIX) 40 MG tablet Take 1 tablet (40 mg total) by mouth daily. 01/25/20 04/24/20  Alford Highland, MD  sertraline (ZOLOFT) 50 MG tablet Take 1 tablet (50 mg total) by mouth daily. 01/25/20   Alford Highland, MD  ticagrelor (BRILINTA) 90 MG TABS tablet Take 1 tablet (90 mg total) by mouth every 12 (twelve) hours. 01/30/20   Alver Sorrow, NP    No Known Allergies  Family History  Problem Relation Age of Onset   Coronary artery disease Mother    Arrhythmia Mother         Pacemaker placed in 2021   Heart failure Mother    Coronary artery disease Father     Social History Social History   Tobacco Use   Smoking status: Former    Packs/day: 1.00    Years: 22.00    Pack years: 22.00    Types: Cigarettes    Quit date: 01/23/2020    Years since quitting: 0.5   Smokeless tobacco: Never  Vaping Use   Vaping Use: Never used  Substance Use Topics   Alcohol use: Yes    Comment: rarely   Drug use: No    Review of Systems Constitutional: Negative for fever.  Weakness today. Cardiovascular: Mild chest discomfort over the past day or so.  None currently Respiratory: Negative for shortness of breath. Gastrointestinal: Negative for abdominal pain, vomiting  Musculoskeletal: Negative for musculoskeletal complaints Skin: Small itching brown patches over his skin Neurological: Negative for headache All other ROS negative  ____________________________________________   PHYSICAL EXAM:  VITAL SIGNS: ED Triage Vitals  Enc Vitals Group     BP 08/06/20 1730 139/86     Pulse Rate 08/06/20 1730 96     Resp 08/06/20 1730 18     Temp 08/06/20 1730 98 F (36.7 C)     Temp src --      SpO2 08/06/20 1730 97 %     Weight --      Height --      Head Circumference --      Peak Flow --      Pain Score 08/06/20 1728 2     Pain Loc --      Pain Edu? --      Excl. in GC? --     Constitutional: Alert and oriented. Well appearing and in no distress. Eyes: Normal exam ENT      Head: Normocephalic and atraumatic.      Mouth/Throat: Mucous membranes are moist. Cardiovascular: Normal rate, regular rhythm. No murmurs, rubs, or gallops. Respiratory: Normal respiratory effort without tachypnea nor retractions. Breath sounds are clear Gastrointestinal: Soft and nontender. No distention.   Musculoskeletal: Nontender with normal range of motion in all extremities.  Neurologic:  Normal speech and language. No gross focal neurologic deficits Skin: Small brown patches  upper back and upper extremities with small red area in the middle of some of these patches several which appear excoriated. Psychiatric: Mood and affect are normal.  ____________________________________________    EKG  EKG viewed and interpreted by myself shows a sinus rhythm at 94 bpm with a narrow QRS, normal axis, normal intervals, patient has anterolateral ST changes, overall unchanged from prior EKG.  ____________________________________________    RADIOLOGY  Chest x-ray negative  ____________________________________________   INITIAL IMPRESSION / ASSESSMENT AND PLAN / ED COURSE  Pertinent labs & imaging results that were available during my care of the  patient were reviewed by me and considered in my medical decision making (see chart for details).   Patient presents emergency department with complaints of weakness, near syncope and chest pain today.  Currently the patient appears well, no distress.  Is work-up is overall reassuring.  Troponin unchanged x2.  Lab work largely at baseline.  Patient admits he is stopped taking some of his medications recently.  Has been using nitroglycerin 5 or 6 times per week.  I discussed with patient he really needs to follow-up with his cardiologist, he is agreeable.  As far as the rash I believe a trial of permethrin cream would be useful.  I spoke to the patient regarding this and he would like to try it for possible scabies.  However I also recommended that he follow-up with a dermatologist.  Patient agreeable to plan of care.  Logan Crawford was evaluated in Emergency Department on 08/06/2020 for the symptoms described in the history of present illness. He was evaluated in the context of the global COVID-19 pandemic, which necessitated consideration that the patient might be at risk for infection with the SARS-CoV-2 virus that causes COVID-19. Institutional protocols and algorithms that pertain to the evaluation of patients at risk for  COVID-19 are in a state of rapid change based on information released by regulatory bodies including the CDC and federal and state organizations. These policies and algorithms were followed during the patient's care in the ED.  ____________________________________________   FINAL CLINICAL IMPRESSION(S) / ED DIAGNOSES  Chest pain, weakness, near syncope   Minna Antis, MD 08/06/20 2030

## 2020-08-06 NOTE — Discharge Instructions (Addendum)
Please follow-up with cardiology by calling the number provided to arrange follow-up appointment soon as possible.  As we discussed please apply your permethrin cream to your entire body besides your face allowed to rest overnight 8 hours and then shower off the next day.  Please wash all clothes and bedding.  Please follow-up with dermatology for further evaluation.  Return to the emergency department for any significant increase in chest pain, any syncopal episodes, or any other symptom personally concerning to yourself.

## 2020-08-06 NOTE — ED Triage Notes (Signed)
Pt comes with c/o left sided CP that radiates to left arm. Pt states this started earlier today. Pt states hx of stent placement and blockage. Pt states he was outside working with a friend and may have over done it.  Pt states some SOB. Pt also has several brown spots noted all over upper torso. Pt states he noticed them 2-3 months ago. Pt states the they come and go. Areas looked raised and brown in discoloration.  Pt states he took 4 or 5 nitro with little relief.

## 2020-08-14 ENCOUNTER — Telehealth: Payer: Self-pay | Admitting: Cardiology

## 2020-08-14 NOTE — Telephone Encounter (Signed)
No ans no vm   °

## 2020-08-14 NOTE — Telephone Encounter (Signed)
-----   Message from Loman Chroman sent at 06/19/2020  2:07 PM EDT ----- Regarding: FW: no show Unable to lvm for patient ----- Message ----- From: Thayer Headings, Brandy L Sent: 06/19/2020   1:05 PM EDT To: Mickie Bail Burl Scheduling Subject: no show                                        Patient did not show for myoview-patient scheduled for F/U appointment with Dr. Azucena Cecil on 4/29. Please reschedule after testing. Thank you!

## 2020-10-23 ENCOUNTER — Other Ambulatory Visit: Payer: Self-pay | Admitting: *Deleted

## 2020-10-23 MED ORDER — NITROGLYCERIN 0.4 MG SL SUBL: 0.4000 mg | SUBLINGUAL_TABLET | SUBLINGUAL | 0 refills | Status: DC | PRN

## 2020-11-13 ENCOUNTER — Telehealth: Payer: Self-pay | Admitting: Family

## 2020-11-13 NOTE — Telephone Encounter (Signed)
-----   Message from Alver Sorrow, NP sent at 11/13/2020  7:51 AM EDT ----- Received re-enrollment application for Brilinta for patient assistance. He is overdue for office visit - please facilitate to schedule. At office visit can determine whether there is ongoing need for Brilinta.   TY! Alver Sorrow, NP

## 2020-11-13 NOTE — Telephone Encounter (Signed)
Unable to LVM not set up no answer

## 2020-11-16 NOTE — Telephone Encounter (Signed)
Attempted to schedule.  

## 2020-11-26 ENCOUNTER — Encounter: Admission: RE | Admit: 2020-11-26 | Payer: Self-pay | Source: Ambulatory Visit

## 2020-11-29 ENCOUNTER — Ambulatory Visit
Admission: RE | Admit: 2020-11-29 | Discharge: 2020-11-29 | Disposition: A | Discharge status: Home / Self Care | Payer: Self-pay | Source: Ambulatory Visit | Attending: Cardiology | Admitting: Cardiology

## 2020-11-29 ENCOUNTER — Other Ambulatory Visit: Payer: Self-pay

## 2020-11-29 DIAGNOSIS — I2510 Atherosclerotic heart disease of native coronary artery without angina pectoris: Secondary | ICD-10-CM

## 2020-11-29 LAB — NM MYOCAR MULTI W/SPECT W/WALL MOTION / EF
Estimated workload: 1
Exercise duration (min): 0 min
Exercise duration (sec): 0 s
LV dias vol: 66 mL (ref 62–150)
LV sys vol: 32 mL
MPHR: 174 {beats}/min
Nuc Stress EF: 52 %
Peak HR: 99 {beats}/min
Percent HR: 56 %
Rest HR: 83 {beats}/min
Rest Nuclear Isotope Dose: 9.9 mCi
SDS: 0
SRS: 0
SSS: 0
ST Depression (mm): 0 mm
Stress Nuclear Isotope Dose: 32.5 mCi

## 2020-11-29 MED ORDER — REGADENOSON 0.4 MG/5ML IV SOLN
0.4000 mg | Freq: Once | INTRAVENOUS | Status: AC
  Administered 2020-11-29: 0.4 mg via INTRAVENOUS

## 2020-11-29 MED ORDER — TECHNETIUM TC 99M TETROFOSMIN IV KIT
10.0000 | PACK | Freq: Once | INTRAVENOUS | Status: AC | PRN
  Administered 2020-11-29: 9.9 via INTRAVENOUS

## 2020-11-29 MED ORDER — TECHNETIUM TC 99M TETROFOSMIN IV KIT
32.5000 | PACK | Freq: Once | INTRAVENOUS | Status: AC | PRN
  Administered 2020-11-29: 32.5 via INTRAVENOUS

## 2020-12-07 ENCOUNTER — Ambulatory Visit: Payer: Self-pay | Admitting: Cardiology

## 2020-12-10 ENCOUNTER — Encounter: Payer: Self-pay | Admitting: Cardiology

## 2021-05-18 ENCOUNTER — Inpatient Hospital Stay
Admission: EM | Admit: 2021-05-18 | Discharge: 2021-05-21 | DRG: 247 | Disposition: A | Discharge status: Home / Self Care | Payer: Self-pay | Attending: Internal Medicine | Admitting: Internal Medicine

## 2021-05-18 ENCOUNTER — Other Ambulatory Visit: Payer: Self-pay

## 2021-05-18 ENCOUNTER — Observation Stay (HOSPITAL_COMMUNITY)
Admit: 2021-05-18 | Discharge: 2021-05-18 | Disposition: A | Discharge status: Home / Self Care | Payer: Self-pay | Attending: Cardiovascular Disease | Admitting: Cardiovascular Disease

## 2021-05-18 ENCOUNTER — Encounter: Payer: Self-pay | Admitting: Internal Medicine

## 2021-05-18 ENCOUNTER — Observation Stay: Payer: Self-pay

## 2021-05-18 DIAGNOSIS — I252 Old myocardial infarction: Secondary | ICD-10-CM

## 2021-05-18 DIAGNOSIS — E663 Overweight: Secondary | ICD-10-CM

## 2021-05-18 DIAGNOSIS — Z91128 Patient's intentional underdosing of medication regimen for other reason: Secondary | ICD-10-CM

## 2021-05-18 DIAGNOSIS — Z9114 Patient's other noncompliance with medication regimen: Secondary | ICD-10-CM

## 2021-05-18 DIAGNOSIS — I214 Non-ST elevation (NSTEMI) myocardial infarction: Principal | ICD-10-CM | POA: Diagnosis present

## 2021-05-18 DIAGNOSIS — I2510 Atherosclerotic heart disease of native coronary artery without angina pectoris: Secondary | ICD-10-CM

## 2021-05-18 DIAGNOSIS — T465X6A Underdosing of other antihypertensive drugs, initial encounter: Secondary | ICD-10-CM | POA: Diagnosis present

## 2021-05-18 DIAGNOSIS — Z720 Tobacco use: Secondary | ICD-10-CM | POA: Diagnosis present

## 2021-05-18 DIAGNOSIS — I739 Peripheral vascular disease, unspecified: Secondary | ICD-10-CM | POA: Diagnosis present

## 2021-05-18 DIAGNOSIS — Z8249 Family history of ischemic heart disease and other diseases of the circulatory system: Secondary | ICD-10-CM

## 2021-05-18 DIAGNOSIS — T82855A Stenosis of coronary artery stent, initial encounter: Secondary | ICD-10-CM | POA: Diagnosis present

## 2021-05-18 DIAGNOSIS — E7849 Other hyperlipidemia: Secondary | ICD-10-CM | POA: Diagnosis present

## 2021-05-18 DIAGNOSIS — E785 Hyperlipidemia, unspecified: Secondary | ICD-10-CM

## 2021-05-18 DIAGNOSIS — K219 Gastro-esophageal reflux disease without esophagitis: Secondary | ICD-10-CM | POA: Diagnosis present

## 2021-05-18 DIAGNOSIS — I10 Essential (primary) hypertension: Secondary | ICD-10-CM

## 2021-05-18 DIAGNOSIS — I248 Other forms of acute ischemic heart disease: Secondary | ICD-10-CM

## 2021-05-18 DIAGNOSIS — R778 Other specified abnormalities of plasma proteins: Secondary | ICD-10-CM

## 2021-05-18 DIAGNOSIS — R7989 Other specified abnormal findings of blood chemistry: Secondary | ICD-10-CM

## 2021-05-18 DIAGNOSIS — I160 Hypertensive urgency: Secondary | ICD-10-CM | POA: Diagnosis present

## 2021-05-18 DIAGNOSIS — Z955 Presence of coronary angioplasty implant and graft: Secondary | ICD-10-CM

## 2021-05-18 DIAGNOSIS — E782 Mixed hyperlipidemia: Secondary | ICD-10-CM

## 2021-05-18 DIAGNOSIS — R079 Chest pain, unspecified: Secondary | ICD-10-CM

## 2021-05-18 DIAGNOSIS — Z87891 Personal history of nicotine dependence: Secondary | ICD-10-CM | POA: Diagnosis present

## 2021-05-18 DIAGNOSIS — E669 Obesity, unspecified: Secondary | ICD-10-CM | POA: Diagnosis present

## 2021-05-18 DIAGNOSIS — Z6828 Body mass index (BMI) 28.0-28.9, adult: Secondary | ICD-10-CM

## 2021-05-18 DIAGNOSIS — F17210 Nicotine dependence, cigarettes, uncomplicated: Secondary | ICD-10-CM | POA: Diagnosis present

## 2021-05-18 DIAGNOSIS — I2489 Other forms of acute ischemic heart disease: Secondary | ICD-10-CM

## 2021-05-18 DIAGNOSIS — I255 Ischemic cardiomyopathy: Secondary | ICD-10-CM | POA: Diagnosis present

## 2021-05-18 DIAGNOSIS — Z20822 Contact with and (suspected) exposure to covid-19: Secondary | ICD-10-CM | POA: Diagnosis present

## 2021-05-18 HISTORY — DX: Ischemic cardiomyopathy: I25.5

## 2021-05-18 LAB — CBC WITH DIFFERENTIAL/PLATELET
Abs Immature Granulocytes: 0.03 10*3/uL (ref 0.00–0.07)
Basophils Absolute: 0.1 10*3/uL (ref 0.0–0.1)
Basophils Relative: 1 %
Eosinophils Absolute: 0.8 10*3/uL — ABNORMAL HIGH (ref 0.0–0.5)
Eosinophils Relative: 9 %
HCT: 46.7 % (ref 39.0–52.0)
Hemoglobin: 15.9 g/dL (ref 13.0–17.0)
Immature Granulocytes: 0 %
Lymphocytes Relative: 26 %
Lymphs Abs: 2.3 10*3/uL (ref 0.7–4.0)
MCH: 28.7 pg (ref 26.0–34.0)
MCHC: 34 g/dL (ref 30.0–36.0)
MCV: 84.3 fL (ref 80.0–100.0)
Monocytes Absolute: 0.7 10*3/uL (ref 0.1–1.0)
Monocytes Relative: 8 %
Neutro Abs: 5.1 10*3/uL (ref 1.7–7.7)
Neutrophils Relative %: 56 %
Platelets: 333 10*3/uL (ref 150–400)
RBC: 5.54 MIL/uL (ref 4.22–5.81)
RDW: 14.1 % (ref 11.5–15.5)
WBC: 9 10*3/uL (ref 4.0–10.5)
nRBC: 0 % (ref 0.0–0.2)

## 2021-05-18 LAB — URINE DRUG SCREEN, QUALITATIVE (ARMC ONLY)
Amphetamines, Ur Screen: NOT DETECTED
Barbiturates, Ur Screen: NOT DETECTED
Benzodiazepine, Ur Scrn: NOT DETECTED
Cannabinoid 50 Ng, Ur ~~LOC~~: NOT DETECTED
Cocaine Metabolite,Ur ~~LOC~~: POSITIVE — AB
MDMA (Ecstasy)Ur Screen: NOT DETECTED
Methadone Scn, Ur: NOT DETECTED
Opiate, Ur Screen: POSITIVE — AB
Phencyclidine (PCP) Ur S: NOT DETECTED
Tricyclic, Ur Screen: NOT DETECTED

## 2021-05-18 LAB — HEPARIN LEVEL (UNFRACTIONATED)
Heparin Unfractionated: 0.18 IU/mL — ABNORMAL LOW (ref 0.30–0.70)
Heparin Unfractionated: 0.52 IU/mL (ref 0.30–0.70)

## 2021-05-18 LAB — COMPREHENSIVE METABOLIC PANEL
ALT: 18 U/L (ref 0–44)
AST: 28 U/L (ref 15–41)
Albumin: 3.8 g/dL (ref 3.5–5.0)
Alkaline Phosphatase: 88 U/L (ref 38–126)
Anion gap: 9 (ref 5–15)
BUN: 12 mg/dL (ref 6–20)
CO2: 23 mmol/L (ref 22–32)
Calcium: 8.8 mg/dL — ABNORMAL LOW (ref 8.9–10.3)
Chloride: 105 mmol/L (ref 98–111)
Creatinine, Ser: 1.11 mg/dL (ref 0.61–1.24)
GFR, Estimated: >60 mL/min (ref >60)
Glucose, Bld: 123 mg/dL — ABNORMAL HIGH (ref 70–99)
Potassium: 4 mmol/L (ref 3.5–5.1)
Sodium: 137 mmol/L (ref 135–145)
Total Bilirubin: 0.5 mg/dL (ref 0.3–1.2)
Total Protein: 7.3 g/dL (ref 6.5–8.1)

## 2021-05-18 LAB — APTT: aPTT: 30 seconds (ref 24–36)

## 2021-05-18 LAB — TROPONIN I (HIGH SENSITIVITY)
Troponin I (High Sensitivity): 101 ng/L (ref <18)
Troponin I (High Sensitivity): 110 ng/L (ref <18)
Troponin I (High Sensitivity): 175 ng/L (ref <18)

## 2021-05-18 LAB — PROTIME-INR
INR: 1.3 — ABNORMAL HIGH (ref 0.8–1.2)
Prothrombin Time: 15.7 seconds — ABNORMAL HIGH (ref 11.4–15.2)

## 2021-05-18 LAB — LIPASE, BLOOD: Lipase: 43 U/L (ref 11–51)

## 2021-05-18 LAB — HEMOGLOBIN A1C
Hgb A1c MFr Bld: 5.8 % — ABNORMAL HIGH (ref 4.8–5.6)
Mean Plasma Glucose: 119.76 mg/dL

## 2021-05-18 LAB — HIV ANTIBODY (ROUTINE TESTING W REFLEX): HIV Screen 4th Generation wRfx: NONREACTIVE

## 2021-05-18 MED ORDER — ACETAMINOPHEN 325 MG PO TABS: 650.0000 mg | ORAL_TABLET | Freq: Four times a day (QID) | ORAL | Status: DC | PRN

## 2021-05-18 MED ORDER — HYDRALAZINE HCL 20 MG/ML IJ SOLN: 5.0000 mg | INTRAMUSCULAR | Status: DC | PRN

## 2021-05-18 MED ORDER — HEPARIN BOLUS VIA INFUSION
4000.0000 [IU] | Freq: Once | INTRAVENOUS | Status: AC
  Administered 2021-05-18: 4000 [IU] via INTRAVENOUS
  Filled 2021-05-18: qty 4000

## 2021-05-18 MED ORDER — PANTOPRAZOLE SODIUM 40 MG PO TBEC
40.0000 mg | DELAYED_RELEASE_TABLET | Freq: Every day | ORAL | Status: DC
Start: 2021-05-18 — End: 2021-05-21
  Administered 2021-05-18 – 2021-05-21 (×3): 40 mg via ORAL
  Filled 2021-05-18 (×3): qty 1

## 2021-05-18 MED ORDER — MORPHINE SULFATE (PF) 4 MG/ML IV SOLN
4.0000 mg | Freq: Once | INTRAVENOUS | Status: AC
  Administered 2021-05-18: 4 mg via INTRAVENOUS
  Filled 2021-05-18: qty 1

## 2021-05-18 MED ORDER — NITROGLYCERIN 2 % TD OINT
1.0000 [in_us] | TOPICAL_OINTMENT | Freq: Once | TRANSDERMAL | Status: AC
  Administered 2021-05-18: 1 [in_us] via TOPICAL
  Filled 2021-05-18: qty 1

## 2021-05-18 MED ORDER — PNEUMOCOCCAL 20-VAL CONJ VACC 0.5 ML IM SUSY
0.5000 mL | PREFILLED_SYRINGE | INTRAMUSCULAR | Status: DC
  Filled 2021-05-18: qty 0.5

## 2021-05-18 MED ORDER — ASPIRIN EC 81 MG PO TBEC
81.0000 mg | DELAYED_RELEASE_TABLET | Freq: Every day | ORAL | Status: DC
  Administered 2021-05-19 – 2021-05-21 (×2): 81 mg via ORAL
  Filled 2021-05-18 (×2): qty 1

## 2021-05-18 MED ORDER — ADULT MULTIVITAMIN W/MINERALS CH
1.0000 | ORAL_TABLET | Freq: Every day | ORAL | Status: DC
  Administered 2021-05-18 – 2021-05-21 (×3): 1 via ORAL
  Filled 2021-05-18 (×4): qty 1

## 2021-05-18 MED ORDER — NITROGLYCERIN 0.4 MG SL SUBL: 0.4000 mg | SUBLINGUAL_TABLET | SUBLINGUAL | Status: DC | PRN

## 2021-05-18 MED ORDER — CARVEDILOL 6.25 MG PO TABS
6.2500 mg | ORAL_TABLET | Freq: Two times a day (BID) | ORAL | Status: DC
  Administered 2021-05-18 – 2021-05-21 (×6): 6.25 mg via ORAL
  Filled 2021-05-18 (×6): qty 1

## 2021-05-18 MED ORDER — ISOSORBIDE MONONITRATE ER 60 MG PO TB24
60.0000 mg | ORAL_TABLET | Freq: Every day | ORAL | Status: DC
  Administered 2021-05-18 – 2021-05-21 (×4): 60 mg via ORAL
  Filled 2021-05-18 (×4): qty 1

## 2021-05-18 MED ORDER — ALBUTEROL SULFATE (2.5 MG/3ML) 0.083% IN NEBU
3.0000 mL | INHALATION_SOLUTION | RESPIRATORY_TRACT | Status: DC | PRN
Start: 2021-05-18 — End: 2021-05-21
  Administered 2021-05-19: 3 mL via RESPIRATORY_TRACT
  Filled 2021-05-18: qty 3

## 2021-05-18 MED ORDER — HYDROMORPHONE HCL 1 MG/ML IJ SOLN
1.0000 mg | INTRAMUSCULAR | Status: DC | PRN
  Administered 2021-05-18 – 2021-05-20 (×3): 1 mg via INTRAVENOUS
  Filled 2021-05-18 (×3): qty 1

## 2021-05-18 MED ORDER — ONDANSETRON HCL 4 MG/2ML IJ SOLN
4.0000 mg | Freq: Three times a day (TID) | INTRAMUSCULAR | Status: DC | PRN
Start: 2021-05-18 — End: 2021-05-21
  Administered 2021-05-20: 4 mg via INTRAVENOUS
  Filled 2021-05-18: qty 2

## 2021-05-18 MED ORDER — HEPARIN BOLUS VIA INFUSION
2900.0000 [IU] | Freq: Once | INTRAVENOUS | Status: AC
  Administered 2021-05-18: 2900 [IU] via INTRAVENOUS
  Filled 2021-05-18: qty 2900

## 2021-05-18 MED ORDER — HEPARIN (PORCINE) 25000 UT/250ML-% IV SOLN
1600.0000 [IU]/h | INTRAVENOUS | Status: DC
  Administered 2021-05-18: 1600 [IU]/h via INTRAVENOUS
  Administered 2021-05-18: 1300 [IU]/h via INTRAVENOUS
  Administered 2021-05-19 – 2021-05-20 (×2): 1600 [IU]/h via INTRAVENOUS
  Filled 2021-05-18 (×4): qty 250

## 2021-05-18 MED ORDER — TICAGRELOR 90 MG PO TABS
90.0000 mg | ORAL_TABLET | Freq: Two times a day (BID) | ORAL | Status: DC
  Administered 2021-05-18 – 2021-05-21 (×6): 90 mg via ORAL
  Filled 2021-05-18 (×6): qty 1

## 2021-05-18 MED ORDER — SODIUM CHLORIDE 0.9% FLUSH
3.0000 mL | Freq: Two times a day (BID) | INTRAVENOUS | Status: DC
  Administered 2021-05-19 – 2021-05-21 (×4): 3 mL via INTRAVENOUS

## 2021-05-18 MED ORDER — ATORVASTATIN CALCIUM 80 MG PO TABS
80.0000 mg | ORAL_TABLET | Freq: Every day | ORAL | Status: DC
  Administered 2021-05-18 – 2021-05-21 (×4): 80 mg via ORAL
  Filled 2021-05-18 (×4): qty 1

## 2021-05-18 MED ORDER — SODIUM CHLORIDE 0.9 % IV SOLN: INTRAVENOUS | Status: DC

## 2021-05-18 MED ORDER — NICOTINE 21 MG/24HR TD PT24
21.0000 mg | MEDICATED_PATCH | Freq: Every day | TRANSDERMAL | Status: DC
Start: 2021-05-18 — End: 2021-05-21
  Filled 2021-05-18: qty 1

## 2021-05-18 MED ORDER — ONDANSETRON HCL 4 MG/2ML IJ SOLN
4.0000 mg | INTRAMUSCULAR | Status: AC
  Administered 2021-05-18: 4 mg via INTRAVENOUS
  Filled 2021-05-18: qty 2

## 2021-05-18 MED ORDER — AMLODIPINE BESYLATE 10 MG PO TABS
10.0000 mg | ORAL_TABLET | Freq: Every day | ORAL | Status: DC
Start: 2021-05-18 — End: 2021-05-21
  Administered 2021-05-18 – 2021-05-21 (×4): 10 mg via ORAL
  Filled 2021-05-18: qty 1
  Filled 2021-05-18: qty 2
  Filled 2021-05-18 (×2): qty 1

## 2021-05-18 NOTE — Consult Note (Signed)
ANTICOAGULATION CONSULT NOTE - Initial Consult ? ?Pharmacy Consult for heparin infusion ?Indication: chest pain/ACS ? ?No Known Allergies ? ?Patient Measurements: ?Height: 6' (182.9 cm) ?Weight: 98.1 kg (216 lb 4.3 oz) ?IBW/kg (Calculated) : 77.6 ?Heparin Dosing Weight: 96.5 kg ? ?Vital Signs: ?Temp: 97.9 ?F (36.6 ?C) (03/25 1202) ?Temp Source: Oral (03/25 0827) ?BP: 142/92 (03/25 1202) ?Pulse Rate: 102 (03/25 1202) ? ?Labs: ?Recent Labs  ?  05/18/21 ?0607 05/18/21 ?0711 05/18/21 ?0823 05/18/21 ?1026 05/18/21 ?1318  ?HGB 15.9  --   --   --   --   ?HCT 46.7  --   --   --   --   ?PLT 333  --   --   --   --   ?APTT  --  30  --   --   --   ?LABPROT  --  15.7*  --   --   --   ?INR  --  1.3*  --   --   --   ?HEPARINUNFRC  --   --   --   --  0.18*  ?CREATININE 1.11  --   --   --   --   ?TROPONINIHS 101*  --  110* 175*  --   ? ? ? ?Estimated Creatinine Clearance: 99.8 mL/min (by C-G formula based on SCr of 1.11 mg/dL). ? ? ?Medical History: ?Past Medical History:  ?Diagnosis Date  ? CAD (coronary artery disease)   ? a. 11/2019 NSTEMI/PCI: LM min irregs, LAD 32m (2.75x22 Resolute Onyx DES), 40d, D2 min irregs, LCX/LPAV min irregs, RCA 60ost, 40p/m, EF 45-50%; b. 11/2020 MV: EF 43%, small area of apical ischemia ->? over processing-->low risk.  ? Hyperlipidemia   ? Hypertension   ? Ischemic cardiomyopathy   ? a. 11/2019 LV Gram: EF 45-50%; b. 12/2019 Echo: EF 50% w/ mod mid-apical anteroseptal HK, GrI DD, nl RV fxn, mild Ao sclerosis.  ? Tobacco use 11/25/2019  ? ? ?Medications:  ?No prior anticoagulation noted from chart review ? ?Assessment: ?46 year old male with PHM of CAD, hypertension, MI with stent  presented with chest pain. Troponin 101 ? ?3/25 1318 HL 0.18  ? ?Goal of Therapy:  ?Heparin level 0.3-0.7 units/ml ?Monitor platelets by anticoagulation protocol: Yes ?  ?Plan:  ?Give 2900 units bolus x 1 ?Increase heparin infusion to 1600 units/hr ?Check anti-Xa level in 8 hours following rate change ?Continue to monitor  H&H and platelets ? ?Sharen Hones, PharmD, BCPS ?Clinical Pharmacist  ?05/18/2021,1:44 PM ? ? ?

## 2021-05-18 NOTE — Consult Note (Signed)
ANTICOAGULATION CONSULT NOTE - Initial Consult ? ?Pharmacy Consult for heparin infusion ?Indication: chest pain/ACS ? ?No Known Allergies ? ?Patient Measurements: ?Height: 6' (182.9 cm) ?Weight: 98.1 kg (216 lb 4.3 oz) ?IBW/kg (Calculated) : 77.6 ?Heparin Dosing Weight: 96.5 kg ? ?Vital Signs: ?Temp: 97.7 ?F (36.5 ?C) (03/25 1914) ?Temp Source: Oral (03/25 1651) ?BP: 141/74 (03/25 1914) ?Pulse Rate: 89 (03/25 1914) ? ?Labs: ?Recent Labs  ?  05/18/21 ?0607 05/18/21 ?0711 05/18/21 ?0823 05/18/21 ?1026 05/18/21 ?1318 05/18/21 ?2026  ?HGB 15.9  --   --   --   --   --   ?HCT 46.7  --   --   --   --   --   ?PLT 333  --   --   --   --   --   ?APTT  --  30  --   --   --   --   ?LABPROT  --  15.7*  --   --   --   --   ?INR  --  1.3*  --   --   --   --   ?HEPARINUNFRC  --   --   --   --  0.18* 0.52  ?CREATININE 1.11  --   --   --   --   --   ?TROPONINIHS 101*  --  110* 175*  --   --   ? ? ? ?Estimated Creatinine Clearance: 99.8 mL/min (by C-G formula based on SCr of 1.11 mg/dL). ? ? ?Medical History: ?Past Medical History:  ?Diagnosis Date  ? CAD (coronary artery disease)   ? a. 11/2019 NSTEMI/PCI: LM min irregs, LAD 22m (2.75x22 Resolute Onyx DES), 40d, D2 min irregs, LCX/LPAV min irregs, RCA 60ost, 40p/m, EF 45-50%; b. 11/2020 MV: EF 43%, small area of apical ischemia ->? over processing-->low risk.  ? Hyperlipidemia   ? Hypertension   ? Ischemic cardiomyopathy   ? a. 11/2019 LV Gram: EF 45-50%; b. 12/2019 Echo: EF 50% w/ mod mid-apical anteroseptal HK, GrI DD, nl RV fxn, mild Ao sclerosis.  ? Tobacco use 11/25/2019  ? ? ?Medications:  ?PTA: No prior anticoagulation noted from chart review ?Inpatient: Heparin Drip (3/25 >>>) ?Allergies: NKDA ? ?Assessment: ?46 year old male with PHM of CAD, hypertension, MI with stent  presented with chest pain. Troponin 101 ? ? ?Date Time HL Rate/Comment ?03/25 1318 0.18 Subtherapeutic/ 2900units x1, increase to 1600 u/hr  ?03/25 2026 0.52 Therapeutic x1 ? ?Goal of Therapy:  ?Heparin level  0.3-0.7 units/ml ?Monitor platelets by anticoagulation protocol: Yes ?  ?Plan:  ?Continue heparin infusion to 1600 units/hr ?Check anti-Xa level in 6 hours and daily once consecutively therapeutic. ?Continue to monitor H&H and platelets daily while on heparin gtt. ? ?Selinda Eon, PharmD, MS PGPM ?Clinical Pharmacist ?05/18/2021 ?10:08 PM ? ? ? ?

## 2021-05-18 NOTE — ED Notes (Signed)
Critical lab called. Trop 101. Provider notified. ?

## 2021-05-18 NOTE — Progress Notes (Signed)
*  PRELIMINARY RESULTS* ?Echocardiogram ?2D Echocardiogram has been performed. ? ?Lenor Coffin ?05/18/2021, 5:20 PM ?

## 2021-05-18 NOTE — Consult Note (Signed)
ANTICOAGULATION CONSULT NOTE - Initial Consult ? ?Pharmacy Consult for heparin infusion ?Indication: chest pain/ACS ? ?No Known Allergies ? ?Patient Measurements: ?Height: 6' (182.9 cm) ?Weight: 96.5 kg (212 lb 11.9 oz) ?IBW/kg (Calculated) : 77.6 ?Heparin Dosing Weight: 96.5 kg ? ?Vital Signs: ?Temp: 98 ?F (36.7 ?C) (03/25 0600) ?Temp Source: Oral (03/25 0600) ?BP: 184/106 (03/25 0600) ?Pulse Rate: 110 (03/25 0600) ? ?Labs: ?Recent Labs  ?  05/18/21 ?0607  ?HGB 15.9  ?HCT 46.7  ?PLT 333  ?CREATININE 1.11  ?TROPONINIHS 101*  ? ? ?Estimated Creatinine Clearance: 99.1 mL/min (by C-G formula based on SCr of 1.11 mg/dL). ? ? ?Medical History: ?Past Medical History:  ?Diagnosis Date  ? CAD (coronary artery disease)   ? Hyperlipidemia   ? Hypertension   ? Tobacco use 11/25/2019  ? ? ?Medications:  ?No prior anticoagulation noted from chart review ? ?Assessment: ?46 year old male with PHM of CAD, hypertension, MI with stent  presented with chest pain. Troponin 101 ? ?Goal of Therapy:  ?Heparin level 0.3-0.7 units/ml ?Monitor platelets by anticoagulation protocol: Yes ?  ?Plan:  ?Give 4000 units bolus x 1 ?Start heparin infusion at 1300 units/hr ?Check anti-Xa level in 6 hours and daily while on heparin ?Continue to monitor H&H and platelets ? ?Sharen Hones, PharmD, BCPS ?Clinical Pharmacist  ?05/18/2021,7:01 AM ? ? ?

## 2021-05-18 NOTE — Consult Note (Addendum)
? ?Cardiology Consult  ?  ?Patient ID: Logan Crawford ?MRN: [id number], DOB/AGE: November 12, 1975  ? ?Admit date: 05/18/2021 ?Date of Consult: 05/18/2021 ? ?Primary Physician: Pcp, No ?Primary Cardiologist: Kate Sable, MD ?Requesting Provider: Mora Bellman, MD ? ?Patient Profile  ?  ?Logan Crawford is a 46 y.o. male with a history of CAD s/p NSTEMI/PCI of the LAD in 11/2019, HTN, HL, Ischemic Cardiomyoapthy, and tob abuse, who is being seen today for the evaluation of chest pain/NSTEMI at the request of Dr. Blaine Hamper. ? ?Past Medical History  ? ?Past Medical History:  ?Diagnosis Date  ? CAD (coronary artery disease)   ? a. 11/2019 NSTEMI/PCI: LM min irregs, LAD 55m (2.75x22 Resolute Onyx DES), 40d, D2 min irregs, LCX/LPAV min irregs, RCA 60ost, 40p/m, EF 45-50%; b. 11/2020 MV: EF 43%, small area of apical ischemia ->? over processing-->low risk.  ? Hyperlipidemia   ? Hypertension   ? Ischemic cardiomyopathy   ? a. 11/2019 LV Gram: EF 45-50%; b. 12/2019 Echo: EF 50% w/ mod mid-apical anteroseptal HK, GrI DD, nl RV fxn, mild Ao sclerosis.  ? Tobacco use 11/25/2019  ?  ?Past Surgical History:  ?Procedure Laterality Date  ? CORONARY STENT INTERVENTION N/A 01/24/2020  ? Procedure: CORONARY STENT INTERVENTION;  Surgeon: Wellington Hampshire, MD;  Location: [facility];  Service: Cardiovascular;  Laterality: N/A;  ? IRRIGATION AND DEBRIDEMENT KNEE Left   ? LEFT HEART CATH AND CORONARY ANGIOGRAPHY N/A 01/24/2020  ? Procedure: LEFT HEART CATH AND CORONARY ANGIOGRAPHY;  Surgeon: Wellington Hampshire, MD;  Location: [facility];  Service: Cardiovascular;  Laterality: N/A;  ? TYMPANOSTOMY TUBE PLACEMENT Bilateral   ?  ? ?Allergies ? ?No Known Allergies ? ?History of Present Illness  ?  ?46 y/o ? w/ the above complex PMH including CAD, HTN, HL, ICM, and tob abuse. He presented in 11/2019 w/ chest pain and ruled in for NSTEMI.  Echo showed and EF of 45-50% w/ moderate mid-apical anteroseptal HK.  Cath revealed severe mid LAD  dzs, which was successfully treated w/ a DES.  He had moderate residual ostial/prox RCA dzs, which was medically managed.  At f/u in 04/2020, he reported ongoing intermittent c/p.  Stress testing was ordered, however, pt deferred scheduling until 11/2020, at which time lexiscan myoview was performed and low-risk w/ a small area of apical ischemia, which was present on attenuation correction imaging, and felt to be 2/2 over processing. ? ?Patient notes that he has been taking medication sporadically over the past year.  He experiences chest discomfort once every few weeks, usually at rest.  In the setting of an episode of chest discomfort, he will take all of his medicines, but otherwise does not use them daily.  He has started smoking again-a few cigarettes a week.  He occasionally drinks alcohol.  Over the past 10 days, he has noted more frequent rest and exertional substernal chest heaviness associated with dyspnea, lasting a few minutes, and resolving spontaneously or after taking his medicines.  He had an episode of chest pain following awakening on March 24, which resolved within a few minutes.  He was busy throughout the day without recurrent symptoms but then when he returned home last night, he laid down for bed and had severe substernal chest discomfort associated with dyspnea.  He checked his blood pressure and the systolic was over [id number].  He took 3 nitroglycerin and 3 baby aspirin without relief and then called his son, who brought him into  the emergency department.  Here, he was given nitroglycerin paste with relief of symptoms and about an hour (total duration 1 to 2 hours).  ECG shows sinus tachycardia at 109 with biatrial lodgment, prior inferior infarct, abnormal R wave progression, and inferolateral ST depression with T wave inversion which was less pronounced than on previous ECGs.  Initial troponin returned at 101.  He was placed on heparin.  Follow-up troponin this morning is slightly more elevated  110.  He is currently chest pain-free. ? ?Inpatient Medications  ?  ? amLODipine  10 mg Oral Daily  ? [START ON 05/19/2021] aspirin EC  81 mg Oral Daily  ? atorvastatin  80 mg Oral Daily  ? carvedilol  6.25 mg Oral BID WC  ? isosorbide mononitrate  60 mg Oral Daily  ? multivitamin with minerals  1 tablet Oral Daily  ? nicotine  21 mg Transdermal Daily  ? pantoprazole  40 mg Oral Daily  ? ticagrelor  90 mg Oral Q12H  ? ? ?Family History  ?  ?Family History  ?Problem Relation Age of Onset  ? Coronary artery disease Mother   ? Arrhythmia Mother   ?     Pacemaker placed in 2021  ? Heart failure Mother   ? Coronary artery disease Father   ? ?He indicated that his mother is alive. He indicated that his father is deceased. ? ? ?Social History  ?  ?Social History  ? ?Socioeconomic History  ? Marital status: Single  ?  Spouse name: Not on file  ? Number of children: Not on file  ? Years of education: Not on file  ? Highest education level: Not on file  ?Occupational History  ? Not on file  ?Tobacco Use  ? Smoking status: Former  ?  Packs/day: 1.00  ?  Years: 22.00  ?  Pack years: 22.00  ?  Types: Cigarettes  ?  Quit date: 01/23/2020  ?  Years since quitting: 1.3  ? Smokeless tobacco: Never  ?Vaping Use  ? Vaping Use: Never used  ?Substance and Sexual Activity  ? Alcohol use: Yes  ?  Comment: rarely  ? Drug use: No  ? Sexual activity: Not on file  ?Other Topics Concern  ? Not on file  ?Social History Narrative  ? Not on file  ? ?Social Determinants of Health  ? ?Financial Resource Strain: Not on file  ?Food Insecurity: Not on file  ?Transportation Needs: Not on file  ?Physical Activity: Not on file  ?Stress: Not on file  ?Social Connections: Not on file  ?Intimate Partner Violence: Not on file  ?  ? ?Review of Systems  ?  ?General:  No chills, fever, night sweats or weight changes.  ?Cardiovascular:  +++ chest pain, +++ dyspnea on exertion, no edema, orthopnea, palpitations, paroxysmal nocturnal dyspnea. ?Dermatological: No  rash, lesions/masses ?Respiratory: No cough, +++ dyspnea ?Urologic: No hematuria, dysuria ?Abdominal:   No nausea, vomiting, diarrhea, bright red blood per rectum, melena, or hematemesis ?Neurologic:  No visual changes, wkns, changes in mental status. ?All other systems reviewed and are otherwise negative except as noted above. ? ?Physical Exam  ?  ?Blood pressure (!) 154/96, pulse (!) 103, temperature 97.7 ?F (36.5 ?C), temperature source Oral, resp. rate 14, height 6' (1.829 m), weight 98.1 kg, SpO2 98 %.  ?General: Pleasant, NAD ?Psych: Normal affect. ?Neuro: Alert and oriented X 3. Moves all extremities spontaneously. ?HEENT: Normal  ?Neck: Supple without bruits or JVD. ?Lungs:  Resp regular and  unlabored, CTA. ?Heart: RRR no s3, s4, or murmurs. ?Abdomen: Soft, non-tender, non-distended, BS + x 4.  ?Extremities: No clubbing, cyanosis or edema. DP/PT2+, Radials 2+ and equal bilaterally. ? ?Labs  ?  ?Cardiac Enzymes ?Recent Labs  ?Lab 05/18/21 ?[id number] 05/18/21 ?0823  ?TROPONINIHS 101* 110*  ?   ?BNP ?   ?Component Value Date/Time  ? BNP 27.6 11/25/2019 1904  ? ? ?ProBNP ?No results found for: PROBNP ? ?Lab Results  ?Component Value Date  ? WBC 9.0 05/18/2021  ? HGB 15.9 05/18/2021  ? HCT 46.7 05/18/2021  ? MCV 84.3 05/18/2021  ? PLT 333 05/18/2021  ?  ?Recent Labs  ?Lab 05/18/21 ?0607  ?NA 137  ?K 4.0  ?CL 105  ?CO2 23  ?BUN 12  ?CREATININE 1.11  ?CALCIUM 8.8*  ?PROT 7.3  ?BILITOT 0.5  ?ALKPHOS 88  ?ALT 18  ?AST 28  ?GLUCOSE 123*  ? ?Lab Results  ?Component Value Date  ? CHOL 173 01/24/2020  ? HDL 38 (L) 01/24/2020  ? [place] 83 01/24/2020  ? TRIG 260 (H) 01/24/2020  ?  ?Radiology Studies  ?  ?No results found. ? ?ECG & Cardiac Imaging  ?  ?Sinus tachycardia, 109, biatrial enlargement, prior inf infarct, abnl R progression, inflat ST dep/TWI (less pronounced) - personally reviewed. ? ?Assessment & Plan  ?  ?1.  NSTEMI/CAD: Patient with a history of CAD status post non-STEMI and LAD stenting in October 2021.  He has  residual right coronary artery disease.  Following PCI, he continued to have sporadic chest discomfort and underwent stress testing October 2022, which was low risk with question of a small area of apic

## 2021-05-18 NOTE — ED Triage Notes (Addendum)
Pt states he has been having chest pain for the last week off and on. Reports shortness of breath. Pt took 3 nitroglycerin and (3) 81mg  ASA at home. ?

## 2021-05-18 NOTE — H&P (Addendum)
?History and Physical  ? ? ?Logan HummerJohnathan C Crawford [id number] DOB: 1975/07/26 DOA: 05/18/2021 ? ?Referring MD/NP/PA:  ? ?PCP: Pcp, No  ? ?Patient coming from:  The patient is coming from home.  At baseline, pt is independent for most of ADL.       ? ?Chief Complaint: chest pain ? ?HPI: Logan Crawford is a 46 y.o. male with medical history significant of HTN, HLD, CAD, s/p of DES 2021, GERD, depression, obesity with BMI 28.85, medication noncompliance, tobacco abuse, who presents with chest pain. ? ?Patient states that he has intermittent chest pain for more than a week, which has worsened since this morning at about 3 to 4 AM.  The chest pain is located in substernal area, 8 out of 10 in severity, pressure-like, nonradiating.  Associated with mild shortness of breath.  No cough, fever or chills.  Patient took 324 mg of aspirin at home and nitroglycerin with little improvement.  Patient does not have nausea, vomiting, diarrhea or abdominal pain.  No symptoms of UTI.  No recent fall or head injury.  No rectal bleeding or dark stool. ? ?Data Reviewed and ED Course: pt was found to have troponin 101, WBC 9.0, GFR > 60, blood pressure 189/106, heart rate 110, RR 15, oxygen saturation 94% on room air.  Patient is placed on telemetry bed for patient, Dr. Mariah MillingGollan of cardiology is consulted. ? ? ?EKG: I have personally reviewed.  Sinus rhythm, QTc 437, left axis enlargement, Q waves in the inferior leads, mild T wave inversion in lateral leads, V5-V6 and inferior leads.  ? ? ?Review of Systems:  ? ?General: no fevers, chills, no body weight gain, has fatigue ?HEENT: no blurry vision, hearing changes or sore throat ?Respiratory: has dyspnea, no coughing, wheezing ?CV: has chest pain, no palpitations ?GI: no nausea, vomiting, abdominal pain, diarrhea, constipation ?GU: no dysuria, burning on urination, increased urinary frequency, hematuria  ?Ext: no leg edema ?Neuro: no unilateral weakness, numbness, or tingling, no vision  change or hearing loss ?Skin: no rash, no skin tear. ?MSK: No muscle spasm, no deformity, no limitation of range of movement in spin ?Heme: No easy bruising.  ?Travel history: No recent long distant travel. ? ? ?Allergy: No Known Allergies ? ?Past Medical History:  ?Diagnosis Date  ? CAD (coronary artery disease)   ? a. 11/2019 NSTEMI/PCI: LM min irregs, LAD 468m (2.75x22 Resolute Onyx DES), 40d, D2 min irregs, LCX/LPAV min irregs, RCA 60ost, 40p/m, EF 45-50%; b. 11/2020 MV: EF 43%, small area of apical ischemia ->? over processing-->low risk.  ? Hyperlipidemia   ? Hypertension   ? Ischemic cardiomyopathy   ? a. 11/2019 LV Gram: EF 45-50%; b. 12/2019 Echo: EF 50% w/ mod mid-apical anteroseptal HK, GrI DD, nl RV fxn, mild Ao sclerosis.  ? Tobacco use 11/25/2019  ? ? ?Past Surgical History:  ?Procedure Laterality Date  ? CORONARY STENT INTERVENTION N/A 01/24/2020  ? Procedure: CORONARY STENT INTERVENTION;  Surgeon: Iran OuchArida, Muhammad A, MD;  Location: [facility];  Service: Cardiovascular;  Laterality: N/A;  ? IRRIGATION AND DEBRIDEMENT KNEE Left   ? LEFT HEART CATH AND CORONARY ANGIOGRAPHY N/A 01/24/2020  ? Procedure: LEFT HEART CATH AND CORONARY ANGIOGRAPHY;  Surgeon: Iran OuchArida, Muhammad A, MD;  Location: [facility];  Service: Cardiovascular;  Laterality: N/A;  ? TYMPANOSTOMY TUBE PLACEMENT Bilateral   ? ? ?Social History:  reports that he quit smoking about 15 months ago. His smoking use included cigarettes. He has a 22.00  pack-year smoking history. He has never used smokeless tobacco. He reports current alcohol use. He reports that he does not use drugs. ? ?Family History:  ?Family History  ?Problem Relation Age of Onset  ? Coronary artery disease Mother   ? Arrhythmia Mother   ?     Pacemaker placed in 2021  ? Heart failure Mother   ? Coronary artery disease Father   ?  ? ?Prior to Admission medications   ?Medication Sig Start Date End Date Taking? Authorizing Provider  ?albuterol (VENTOLIN HFA) 108  (90 Base) MCG/ACT inhaler Inhale 2 puffs into the lungs every 6 (six) hours as needed for wheezing or shortness of breath. 11/27/19 02/25/20  [place], Eric J, DO  ?amLODipine (NORVASC) 10 MG tablet Take 1 tablet (10 mg total) by mouth daily. 05/18/20   Debbe Odea, MD  ?aspirin EC 81 MG EC tablet Take 1 tablet (81 mg total) by mouth daily. Swallow whole. 01/26/20   Alford Highland, MD  ?atorvastatin (LIPITOR) 80 MG tablet Take 1 tablet (80 mg total) by mouth daily. 05/18/20   Debbe Odea, MD  ?carvedilol (COREG) 6.25 MG tablet Take 1 tablet (6.25 mg total) by mouth 2 (two) times daily with a meal. 05/18/20   Debbe Odea, MD  ?isosorbide mononitrate (IMDUR) 60 MG 24 hr tablet Take 1 tablet (60 mg total) by mouth daily. 05/18/20 08/16/20  Debbe Odea, MD  ?Multiple Vitamin (MULTIVITAMIN) tablet Take 1 tablet by mouth daily.    [provider]  ?Niacin (VITAMIN B-3 PO) Take by mouth daily.    [provider]  ?nitroGLYCERIN (NITROSTAT) 0.4 MG SL tablet Place 1 tablet (0.4 mg total) under the tongue every 5 (five) minutes x 3 doses as needed for chest pain. 10/23/20   Debbe Odea, MD  ?pantoprazole (PROTONIX) 40 MG tablet Take 1 tablet (40 mg total) by mouth daily. 01/25/20 04/24/20  Alford Highland, MD  ?sertraline (ZOLOFT) 50 MG tablet Take 1 tablet (50 mg total) by mouth daily. 01/25/20   Alford Highland, MD  ?ticagrelor (BRILINTA) 90 MG TABS tablet Take 1 tablet (90 mg total) by mouth every 12 (twelve) hours. 01/30/20   Alver Sorrow, NP  ? ? ?Physical Exam: ?Vitals:  ? 05/18/21 0700 05/18/21 0755 05/18/21 0756 05/18/21 0827  ?BP: (!) 158/96 (!) 142/91 (!) 142/91 (!) 154/96  ?Pulse: (!) 103  (!) 102 (!) 103  ?Resp: 15  19   ?Temp:   98.1 ?F (36.7 ?C) 97.7 ?F (36.5 ?C)  ?TempSrc:   Oral Oral  ?SpO2: 94%  97% 98%  ?Weight:    98.1 kg  ?Height:    6' (1.829 m)  ? ?General: Not in acute distress ?HEENT: ?      Eyes: PERRL, EOMI, no scleral icterus. ?      ENT: No discharge  from the ears and nose, no pharynx injection, no tonsillar enlargement.  ?      Neck: No JVD, no bruit, no mass felt. ?Heme: No neck lymph node enlargement. ?Cardiac: S1/S2, RRR, No murmurs, No gallops or rubs. ?Respiratory: No rales, wheezing, rhonchi or rubs. ?GI: Soft, nondistended, nontender, no rebound pain, no organomegaly, BS present. ?GU: No hematuria ?Ext: No pitting leg edema bilaterally. 1+DP/PT pulse bilaterally. ?Musculoskeletal: No joint deformities, No joint redness or warmth, no limitation of ROM in spin. ?Skin: No rashes.  ?Neuro: Alert, oriented X3, cranial nerves II-XII grossly intact, moves all extremities normally.  ?Psych: Patient is not psychotic, no suicidal or hemocidal ideation. ? ?Labs  on Admission: I have personally reviewed following labs and imaging studies ? ?CBC: ?Recent Labs  ?Lab 05/18/21 ?0607  ?WBC 9.0  ?NEUTROABS 5.1  ?HGB 15.9  ?HCT 46.7  ?MCV 84.3  ?PLT 333  ? ?Basic Metabolic Panel: ?Recent Labs  ?Lab 05/18/21 ?0607  ?NA 137  ?K 4.0  ?CL 105  ?CO2 23  ?GLUCOSE 123*  ?BUN 12  ?CREATININE 1.11  ?CALCIUM 8.8*  ? ?GFR: ?Estimated Creatinine Clearance: 99.8 mL/min (by C-G formula based on SCr of 1.11 mg/dL). ?Liver Function Tests: ?Recent Labs  ?Lab 05/18/21 ?0607  ?AST 28  ?ALT 18  ?ALKPHOS 88  ?BILITOT 0.5  ?PROT 7.3  ?ALBUMIN 3.8  ? ?Recent Labs  ?Lab 05/18/21 ?0607  ?LIPASE 43  ? ?No results for input(s): AMMONIA in the last 168 hours. ?Coagulation Profile: ?Recent Labs  ?Lab 05/18/21 ?0711  ?INR 1.3*  ? ?Cardiac Enzymes: ?No results for input(s): CKTOTAL, CKMB, CKMBINDEX, TROPONINI in the last 168 hours. ?BNP (last 3 results) ?No results for input(s): PROBNP in the last 8760 hours. ?HbA1C: ?No results for input(s): HGBA1C in the last 72 hours. ?CBG: ?No results for input(s): GLUCAP in the last 168 hours. ?Lipid Profile: ?No results for input(s): CHOL, HDL, LDLCALC, TRIG, CHOLHDL, LDLDIRECT in the last 72 hours. ?Thyroid Function Tests: ?No results for input(s): TSH, T4TOTAL,  FREET4, T3FREE, THYROIDAB in the last 72 hours. ?Anemia Panel: ?No results for input(s): VITAMINB12, FOLATE, FERRITIN, TIBC, IRON, RETICCTPCT in the last 72 hours. ?Urine analysis: ?   ?Component Value Date/Ti

## 2021-05-18 NOTE — ED Notes (Signed)
Pt requesting to have son bring him biscuit from [employer] and cup of ice. Per MD, pt to remain NPO for now. Pt updated, verbalized understanding.  ?

## 2021-05-18 NOTE — ED Provider Notes (Signed)
? ?[facility] ?Provider Note ? ? ? Event Date/Time  ? First MD Initiated Contact with Patient 05/18/21 0602   ?  (approximate) ? ? ?History  ? ?Chest Pain and Shortness of Breath ? ? ?HPI ? ?Logan Crawford is a 46 y.o. male with a history that includes multivessel coronary artery disease status post PCI with 1 drug-eluting stent after prior NSTEMI as well as hypertension, hyperlipidemia, medication noncompliance (reported by the patient), and ongoing tobacco use. ? ?He presents tonight for evaluation of persistent severe chest pain and heaviness.  He said that he has been having intermittent episodes of chest pain for the last week but they have been getting steadily worse.  Tonight he was awakened from sleep between 3:00am and 4:00am with severe crushing central chest pain.  He took 3 nitroglycerin at home and 324 mg of aspirin.  Nothing made it better or worse.  It continued for about an hour and a half before he came to the emergency department.  He said it feels like his prior heart attack. ? ?He admits that he is not compliant with his blood pressure medicine or any of his medicines.  He sometimes takes medication if he is feeling bad but he has not consistent.  He has not seen his cardiologist, Dr. Garen Lah, since at least sometime last year and may be longer.  He continues to smoke though not every day.  He admits that his dietary habits are "not great". ? ?He is having some shortness of breath but mostly it is the chest pain not alleviated with nitroglycerin.  His blood pressure was greater than [id number] systolic at home.  No recent fever, no numbness nor tingling or weakness in his extremities.  No ripping or tearing sensation.  No abdominal pain.  No nausea nor vomiting. ?  ? ? ?Physical Exam  ? ?ED Triage Vitals  ?Enc Vitals Group  ?   BP 05/18/21 0600 (!) 184/106  ?   Pulse Rate 05/18/21 0600 (!) 110  ?   Resp 05/18/21 0600 18  ?   Temp 05/18/21 0600 98 ?F (36.7 ?C)  ?   Temp  Source 05/18/21 0600 Oral  ?   SpO2 05/18/21 0600 97 %  ?   Weight 05/18/21 0600 96.5 kg (212 lb 11.9 oz)  ?   Height 05/18/21 0600 1.829 m (6')  ?   Head Circumference --   ?   Peak Flow --   ?   Pain Score 05/18/21 0559 4  ?   Pain Loc --   ?   Pain Edu? --   ?   Excl. in [place]? --   ? ? ? ?Most recent vital signs: ?Vitals:  ? 05/18/21 0600 05/18/21 0700  ?BP: (!) 184/106 (!) 158/96  ?Pulse: (!) 110 (!) 103  ?Resp: 18 15  ?Temp: 98 ?F (36.7 ?C)   ?SpO2: 97% 94%  ? ? ? ?General: Awake, appears to be in some distress due to pain. ?CV:  Good peripheral perfusion.  Normal heart sounds. ?Resp:  Normal effort.  Lungs are clear to auscultation bilaterally. ?Abd:  No distention.  No tenderness to palpation.  No pulsatile mass. ?Other:  No focal neurological deficits. ? ? ?ED Results / Procedures / Treatments  ? ?Labs ?(all labs ordered are listed, but only abnormal results are displayed) ?Labs Reviewed  ?CBC WITH DIFFERENTIAL/PLATELET - Abnormal; Notable for the following components:  ?    Result Value  ? Eosinophils Absolute  0.8 (*)   ? All other components within normal limits  ?COMPREHENSIVE METABOLIC PANEL - Abnormal; Notable for the following components:  ? Glucose, Bld 123 (*)   ? Calcium 8.8 (*)   ? All other components within normal limits  ?TROPONIN I (HIGH SENSITIVITY) - Abnormal; Notable for the following components:  ? Troponin I (High Sensitivity) 101 (*)   ? All other components within normal limits  ?LIPASE, BLOOD  ?APTT  ?PROTIME-INR  ?HEPARIN LEVEL (UNFRACTIONATED)  ?HEMOGLOBIN A1C  ?TROPONIN I (HIGH SENSITIVITY)  ? ? ? ?EKG ? ?ED ECG REPORT ?IHinda Kehr, the attending physician, personally viewed and interpreted this ECG. ? ?Date: 05/18/2021 ?EKG Time: 6:00 AM ?Rate: 109 ?Rhythm: Sinus tachycardia  ?QRS Axis: normal ?Intervals: normal ?ST/T Wave abnormalities: Patient has somewhat notched T waves in lead II and some abnormalities in lead III and aVF.  He has inverted T waves in lead aVL.  There is a  suggestion of ST segment elevation in leads V1, V2, and V3 with some notched T waves in V5 and V6. ?Narrative Interpretation: Does not meet STEMI criteria but is abnormal and suggest ischemia.  When compared to prior EKG, the overall morphology is similar but with some T wave and ST segment changes, most notable with inversions in aVL and some of the lateral changes. ? ? ? ?RADIOLOGY ?No indication for emergent imaging ? ? ? ?PROCEDURES: ? ?Critical Care performed: Yes, see critical care procedure note(s) ? ?.1-3 Lead EKG Interpretation ?Performed by: Hinda Kehr, MD ?Authorized by: Hinda Kehr, MD  ? ?  Interpretation: abnormal   ?  ECG rate:  110 ?  ECG rate assessment: tachycardic   ?  Rhythm: sinus tachycardia   ?  Ectopy: none   ?  Conduction: normal   ?.Critical Care ?Performed by: Hinda Kehr, MD ?Authorized by: Hinda Kehr, MD  ? ?Critical care provider statement:  ?  Critical care time (minutes):  30 ?  Critical care time was exclusive of:  Separately billable procedures and treating other patients ?  Critical care was necessary to treat or prevent imminent or life-threatening deterioration of the following conditions:  Cardiac failure ?  Critical care was time spent personally by me on the following activities:  Development of treatment plan with patient or surrogate, evaluation of patient's response to treatment, examination of patient, obtaining history from patient or surrogate, ordering and performing treatments and interventions, ordering and review of laboratory studies, ordering and review of radiographic studies, pulse oximetry, re-evaluation of patient's condition and review of old charts ? ? ?MEDICATIONS ORDERED IN ED: ?Medications  ?heparin bolus via infusion 4,000 Units (4,000 Units Intravenous Bolus from Bag 05/18/21 0716)  ?  Followed by  ?heparin ADULT infusion 100 units/mL (25000 units/232mL) (1,300 Units/hr Intravenous New Bag/Given 05/18/21 0716)  ?HYDROmorphone (DILAUDID) injection  1 mg (has no administration in time range)  ?albuterol (VENTOLIN HFA) 108 (90 Base) MCG/ACT inhaler 2 puff (has no administration in time range)  ?nitroGLYCERIN (NITROGLYN) 2 % ointment 1 inch (1 inch Topical Given 05/18/21 0623)  ?morphine (PF) 4 MG/ML injection 4 mg (4 mg Intravenous Given 05/18/21 0623)  ?ondansetron [facility]) injection 4 mg (4 mg Intravenous Given 05/18/21 0623)  ? ? ? ?IMPRESSION / MDM / ASSESSMENT AND PLAN / ED COURSE  ?I reviewed the triage vital signs and the nursing notes. ?             ?               ? ?  Differential diagnosis includes, but is not limited to, ACS, AAS, pneumonia, hypertensive urgency/emergency. ? ?The patient is on the cardiac monitor to evaluate for evidence of arrhythmia and/or significant heart rate changes. ? ?Patient has no infectious symptoms.  Symptoms are very consistent with unstable angina that may have progressed NSTEMI.  I personally reviewed his EKG and an old EKG on record.  Morphology is similar with some baseline abnormalities but there is a suggestion of acute ischemia.  However it does not meet STEMI criteria. ? ?Patient has chronic conditions such as hyperlipidemia and hypertension as well as known coronary artery disease, which put him at significant risk of ACS.  He is also admittedly noncompliant with his medications. ? ?Given his strong probability of ACS, I am starting him on heparin bolus plus infusion by IV.  For his hypertension and chest pain I will order nitroglycerin paste 1 inch; if upon reassessment he still has uncontrolled hypertension pain, I will consider nitroglycerin drip, but this may be a good first step.  I have also ordered morphine 4 mg IV and Zofran 4 mg IV. ? ?I anticipate the patient will need to be admitted given the high risk nature of his chest pain.  Doubt AAS and PE at this time. ? ?Clinical Course as of 05/18/21 0731  ?Sat May 18, 2021  ?0707 Troponin I (High Sensitivity)(!!): 101 ?Troponin elevated at 101, consistent with  ACS versus hypertensive emergency.  I checked on the patient and his pain is not completely resolved but much better and only mild at this time.  He is resting comfortably.  His blood pressure is down to about

## 2021-05-19 LAB — ECHOCARDIOGRAM COMPLETE
AR max vel: 2.1 cm2
AV Peak grad: 6.3 mmHg
Ao pk vel: 1.25 m/s
Area-P 1/2: 3.03 cm2
Calc EF: 54.5 %
Height: 72 in
S' Lateral: 3.1 cm
Single Plane A2C EF: 51.2 %
Single Plane A4C EF: 57.8 %
Weight: 3460.34 oz

## 2021-05-19 LAB — CBC
HCT: 44.1 % (ref 39.0–52.0)
Hemoglobin: 14.7 g/dL (ref 13.0–17.0)
MCH: 28.9 pg (ref 26.0–34.0)
MCHC: 33.3 g/dL (ref 30.0–36.0)
MCV: 86.6 fL (ref 80.0–100.0)
Platelets: 281 10*3/uL (ref 150–400)
RBC: 5.09 MIL/uL (ref 4.22–5.81)
RDW: 14.3 % (ref 11.5–15.5)
WBC: 7.1 10*3/uL (ref 4.0–10.5)
nRBC: 0 % (ref 0.0–0.2)

## 2021-05-19 LAB — TROPONIN I (HIGH SENSITIVITY): Troponin I (High Sensitivity): 131 ng/L (ref <18)

## 2021-05-19 LAB — HEPARIN LEVEL (UNFRACTIONATED): Heparin Unfractionated: 0.47 IU/mL (ref 0.30–0.70)

## 2021-05-19 LAB — LIPID PANEL
Cholesterol: 138 mg/dL (ref 0–200)
HDL: 27 mg/dL — ABNORMAL LOW (ref >40)
LDL Cholesterol: 57 mg/dL (ref 0–99)
Total CHOL/HDL Ratio: 5.1 RATIO
Triglycerides: 268 mg/dL — ABNORMAL HIGH (ref <150)
VLDL: 54 mg/dL — ABNORMAL HIGH (ref 0–40)

## 2021-05-19 LAB — GLUCOSE, CAPILLARY: Glucose-Capillary: 107 mg/dL — ABNORMAL HIGH (ref 70–99)

## 2021-05-19 MED ORDER — ASPIRIN 81 MG PO CHEW
81.0000 mg | CHEWABLE_TABLET | ORAL | Status: AC
  Administered 2021-05-20: 81 mg via ORAL
  Filled 2021-05-19: qty 1

## 2021-05-19 MED ORDER — NITROGLYCERIN 2 % TD OINT
1.0000 [in_us] | TOPICAL_OINTMENT | Freq: Four times a day (QID) | TRANSDERMAL | Status: DC | PRN
  Administered 2021-05-19: 1 [in_us] via TOPICAL
  Filled 2021-05-19: qty 1

## 2021-05-19 MED ORDER — SODIUM CHLORIDE 0.9 % IV SOLN: 250.0000 mL | INTRAVENOUS | Status: DC | PRN

## 2021-05-19 MED ORDER — SODIUM CHLORIDE 0.9 % WEIGHT BASED INFUSION
1.0000 mL/kg/h | INTRAVENOUS | Status: DC
  Administered 2021-05-20: 1 mL/kg/h via INTRAVENOUS

## 2021-05-19 MED ORDER — SODIUM CHLORIDE 0.9 % WEIGHT BASED INFUSION
3.0000 mL/kg/h | INTRAVENOUS | Status: DC
  Administered 2021-05-19: 3 mL/kg/h via INTRAVENOUS

## 2021-05-19 MED ORDER — SODIUM CHLORIDE 0.9% FLUSH: 3.0000 mL | INTRAVENOUS | Status: DC | PRN

## 2021-05-19 NOTE — Assessment & Plan Note (Addendum)
With known history of CAD, unstable angina, prior stent.  Presented with persistent chest pain over several days, worsening in severity.  Initial troponin 110, up trended to 175.  EKG without ST elevation but did have T wave changes in lateral and inferior leads. ?Cardiology consulted ?Treated with heparin drip, Nitropaste, as needed pain medication ?Cardiac cath on 3/27 - DES placed to LAD ? ?-DAPT with ASA and Brilinta without interruption for at least 12 months with continuation of ASA indefinitely  ?-Coreg, amlodipine, Imdur, Lipitor ?-Aggressive risk factor modification and secondary prevention ?-Post cath instructions ?-Cardiac rehab ?-Cardiology follow up in 1-2 weeks ?

## 2021-05-19 NOTE — Assessment & Plan Note (Signed)
See NSTEMI 

## 2021-05-19 NOTE — Assessment & Plan Note (Signed)
Initial BP 184/106, due to medication noncompliance.  Resumed on antihypertensives as outlined. ?

## 2021-05-19 NOTE — Assessment & Plan Note (Signed)
Declines nicotine patch.  Ongoing counseling for smoking cessation.  Primary care follow-up. ?

## 2021-05-19 NOTE — Assessment & Plan Note (Signed)
Continue high intensity statin, Lipitor 80 mg ?

## 2021-05-19 NOTE — Assessment & Plan Note (Signed)
Presenting with active chest pain, unstable angina.  History of prior stent to mid LAD October 2021.  Cardiology following.  See NSTEMI for assessment and plan. ?

## 2021-05-19 NOTE — Assessment & Plan Note (Addendum)
Body mass index is 29.33 kg/m?Marland Kitchen ?Recommend weight loss for cardiovascular risk reduction. ?

## 2021-05-19 NOTE — Consult Note (Signed)
ANTICOAGULATION CONSULT NOTE ? ?Pharmacy Consult for heparin infusion ?Indication: chest pain/ACS ? ?No Known Allergies ? ?Patient Measurements: ?Height: 6' (182.9 cm) ?Weight: 98.1 kg (216 lb 4.3 oz) ?IBW/kg (Calculated) : 77.6 ?Heparin Dosing Weight: 96.5 kg ? ?Vital Signs: ?Temp: 98.4 ?F (36.9 ?C) (03/25 2329) ?Temp Source: Oral (03/25 1651) ?BP: 128/76 (03/25 2329) ?Pulse Rate: 83 (03/25 2329) ? ?Labs: ?Recent Labs  ?  05/18/21 ?0607 05/18/21 ?0711 05/18/21 ?0823 05/18/21 ?1026 05/18/21 ?1318 05/18/21 ?2026 05/19/21 ?0223  ?HGB 15.9  --   --   --   --   --  14.7  ?HCT 46.7  --   --   --   --   --  44.1  ?PLT 333  --   --   --   --   --  281  ?APTT  --  30  --   --   --   --   --   ?LABPROT  --  15.7*  --   --   --   --   --   ?INR  --  1.3*  --   --   --   --   --   ?HEPARINUNFRC  --   --   --   --  0.18* 0.52 0.47  ?CREATININE 1.11  --   --   --   --   --   --   ?TROPONINIHS 101*  --  110* 175*  --   --   --   ? ? ? ?Estimated Creatinine Clearance: 99.8 mL/min (by C-G formula based on SCr of 1.11 mg/dL). ? ? ?Medical History: ?Past Medical History:  ?Diagnosis Date  ? CAD (coronary artery disease)   ? a. 11/2019 NSTEMI/PCI: LM min irregs, LAD 52m (2.75x22 Resolute Onyx DES), 40d, D2 min irregs, LCX/LPAV min irregs, RCA 60ost, 40p/m, EF 45-50%; b. 11/2020 MV: EF 43%, small area of apical ischemia ->? over processing-->low risk.  ? Hyperlipidemia   ? Hypertension   ? Ischemic cardiomyopathy   ? a. 11/2019 LV Gram: EF 45-50%; b. 12/2019 Echo: EF 50% w/ mod mid-apical anteroseptal HK, GrI DD, nl RV fxn, mild Ao sclerosis.  ? Tobacco use 11/25/2019  ? ? ?Medications:  ?PTA: No prior anticoagulation noted from chart review ?Inpatient: Heparin Drip (3/25 >>>) ?Allergies: NKDA ? ?Assessment: ?46 year old male with PHM of CAD, hypertension, MI with stent  presented with chest pain. Troponin 101 ? ? ?Date Time HL Rate/Comment ?03/25 1318 0.18 Subtherapeutic/ 2900units x1, increase to 1600  u/hr  ?03/25 2026 0.52 Therapeutic x1 ?3/26 0223 0.47 Therapeutic x 2 ? ?Goal of Therapy:  ?Heparin level 0.3-0.7 units/ml ?Monitor platelets by anticoagulation protocol: Yes ?  ?Plan:  ?Continue heparin infusion to 1600 units/hr ?Check HL daily w/ AM labs while therapeutic on heparin ?Continue to monitor H&H and platelets daily while on heparin gtt. ? ?Renda Rolls, PharmD, MBA ?05/19/2021 ?3:04 AM ? ? ? ? ?

## 2021-05-19 NOTE — Assessment & Plan Note (Signed)
Uncontrolled on admission due to medication noncompliance. ?-- Continue Imdur, Coreg, amlodipine ?-- Monitor BP and titrate meds for adequate control. ?

## 2021-05-19 NOTE — Progress Notes (Signed)
? ?Progress Note ? ?Patient Name: Logan Crawford ?Date of Encounter: 05/19/2021 ? ?[facility] Cardiologist: Debbe Odea, MD ? ?Subjective  ? ?Resting comfortably, ?Remains on heparin infusion, Nitropaste ? ?Inpatient Medications  ?  ?Scheduled Meds: ? amLODipine  10 mg Oral Daily  ? aspirin EC  81 mg Oral Daily  ? atorvastatin  80 mg Oral Daily  ? carvedilol  6.25 mg Oral BID WC  ? isosorbide mononitrate  60 mg Oral Daily  ? multivitamin with minerals  1 tablet Oral Daily  ? nicotine  21 mg Transdermal Daily  ? pantoprazole  40 mg Oral Daily  ? pneumococcal 20-valent conjugate vaccine  0.5 mL Intramuscular Tomorrow-1000  ? sodium chloride flush  3 mL Intravenous Q12H  ? ticagrelor  90 mg Oral Q12H  ? ?Continuous Infusions: ? heparin 1,600 Units/hr (05/19/21 1205)  ? ?PRN Meds: ?acetaminophen, albuterol, hydrALAZINE, HYDROmorphone (DILAUDID) injection, nitroGLYCERIN, ondansetron (ZOFRAN) IV  ? ?Vital Signs  ?  ?Vitals:  ? 05/19/21 0040 05/19/21 0450 05/19/21 0757 05/19/21 1210  ?BP:  139/81 (!) 137/91 112/73  ?Pulse:  88 82 82  ?Resp:  19 19 18   ?Temp:  98.8 ?F (37.1 ?C) 98 ?F (36.7 ?C) 98.5 ?F (36.9 ?C)  ?TempSrc:   Oral   ?SpO2: 99% 99% 97% 98%  ?Weight:      ?Height:      ? ? ?Intake/Output Summary (Last 24 hours) at 05/19/2021 1314 ?Last data filed at 05/19/2021 1211 ?Gross per 24 hour  ?Intake 896.59 ml  ?Output 3150 ml  ?Net -2253.41 ml  ? ? ?  05/18/2021  ?  8:27 AM 05/18/2021  ?  6:00 AM 05/18/2020  ?  3:29 PM  ?Last 3 Weights  ?Weight (lbs) 216 lb 4.3 oz 212 lb 11.9 oz 206 lb  ?Weight (kg) 98.1 kg 96.5 kg 93.441 kg  ?   ? ?Telemetry  ?  ?Normal sinus rhythm- Personally Reviewed ? ?ECG  ?  ?- Personally Reviewed ? ?Physical Exam  ? ?GEN: No acute distress.   ?Neck: No JVD ?Cardiac: RRR, no murmurs, rubs, or gallops.  ?Respiratory: Clear to auscultation bilaterally. ?GI: Soft, nontender, non-distended  ?MS: No edema; No deformity. ?Neuro:  Nonfocal  ?Psych: Normal affect  ? ?Labs  ?  ?High Sensitivity  Troponin:   ?Recent Labs  ?Lab 05/18/21 ?0607 05/18/21 ?0823 05/18/21 ?1026  ?TROPONINIHS 101* 110* 175*  ?   ?Chemistry ?Recent Labs  ?Lab 05/18/21 ?0607  ?NA 137  ?K 4.0  ?CL 105  ?CO2 23  ?GLUCOSE 123*  ?BUN 12  ?CREATININE 1.11  ?CALCIUM 8.8*  ?PROT 7.3  ?ALBUMIN 3.8  ?AST 28  ?ALT 18  ?ALKPHOS 88  ?BILITOT 0.5  ?GFRNONAA >60  ?ANIONGAP 9  ?  ?Lipids  ?Recent Labs  ?Lab 05/19/21 ?0223  ?CHOL 138  ?TRIG 268*  ?HDL 27*  ?LDLCALC 57  ?CHOLHDL 5.1  ?  ?Hematology ?Recent Labs  ?Lab 05/18/21 ?0607 05/19/21 ?0223  ?WBC 9.0 7.1  ?RBC 5.54 5.09  ?HGB 15.9 14.7  ?HCT 46.7 44.1  ?MCV 84.3 86.6  ?MCH 28.7 28.9  ?MCHC 34.0 33.3  ?RDW 14.1 14.3  ?PLT 333 281  ? ?Thyroid No results for input(s): TSH, FREET4 in the last 168 hours.  ?BNPNo results for input(s): BNP, PROBNP in the last 168 hours.  ?DDimer No results for input(s): DDIMER in the last 168 hours.  ? ?Radiology  ?  ?DG Chest Port 1 View ? ?Result Date: 05/18/2021 ?CLINICAL DATA:  CAD. EXAM: PORTABLE CHEST 1 VIEW COMPARISON:  08/06/2020; 01/23/2020 FINDINGS: Suspected enlargement of the cardiac silhouette. Normal mediastinal contours. No focal parenchymal opacities. No pleural effusion or pneumothorax no evidence of edema. No acute osseous abnormalities. IMPRESSION: 1. Suspected enlargement of the cardiac silhouette, potentially artifactual due to AP projection, though could be seen in the setting cardiomegaly and/or a pericardial effusion. Clinical correlation is advised. Further evaluation with cardiac echo could be performed as indicated. 2. Otherwise, no acute cardiopulmonary disease. Electronically Signed   By: Simonne Come M.D.   On: 05/18/2021 09:35   ? ?Cardiac Studies  ? ?Echocardiogram pending ? ?Patient Profile  ?   ?Logan Crawford is a 46 y.o. male with a hx of coronary artery disease, prior stent to mid LAD October 2021, essential hypertension, smoking, hyperlipidemia, medication noncompliance, presenting with unstable angina symptoms ? ?Assessment & Plan   ?  ?Non-STEMI ?Known coronary artery disease with unstable angina ?Smoker, medication noncompliance, poorly controlled blood pressure contributing ?Worsening symptoms past week or 2 with increasing nitro use, angina lasting longer, more intense leading to admission ?-Anginal symptoms stabilized with heparin, Nitropaste, pain medication ?-- Plan is for cardiac catheterization tomorrow morning ?N.p.o. after midnight ?Continue aspirin, beta-blocker, statin ?  ?Smoker ?Smoking cessation recommended ?Yesterday we discussed various strategies ?At that time declined a nicotine patch ?  ?Hyperlipidemia ?High intensity statin, Lipitor 80 restarted ?History of medication noncompliance ?  ?Essential hypertension ?On isosorbide 60 daily, carvedilol, amlodipine ?Noncompliance as outpatient ?On medications, blood pressure well controlled ? ? Total encounter time more than 40 minutes ? Greater than 50% was spent in counseling and coordination of care with the patient ? ?  ? ?For questions or updates, please contact [facility] ?Please consult [email or web address] for contact info under  ? ?  ?   ?Signed, ?Julien Nordmann, MD  ?05/19/2021, 1:14 PM   ? ?

## 2021-05-19 NOTE — TOC Initial Note (Signed)
Transition of Care (TOC) - Initial/Assessment Note  ? ? ?Patient Details  ?Name: Logan Crawford ?MRN: [id number] ?Date of Birth: 1975-04-11 ? ?Transition of Care (TOC) CM/SW Contact:    ?Joseph Art, LCSW ?Phone Number: [phone] ?05/19/2021, 11:43 AM ? ?Clinical Narrative:                 ? ?Patient presents to [facility] due to chest pain. Patient has hx of history significant of HTN, HLD, CAD, s/p of DES 2021, GERD, depression, obesity with BMI 28.85, medication noncompliance, and tobacco abuse. Patient independent with most ADLs. Plan is for patient to receive heart cath tomorrow 05/20/2021.  Main contacts mother, Eh Sesay [phone] and Emilee Hero [phone], friend. ? ?Expected Discharge Plan: Home w Home Health Services ?Barriers to Discharge: Continued Medical Work up ? ? ?Patient Goals and CMS Choice ?  ?  ?  ? ?Expected Discharge Plan and Services ?Expected Discharge Plan: Home w Home Health Services ?In-house Referral: Clinical Social Work ?  ?Post Acute Care Choice: Home Health ?Living arrangements for the past 2 months: Mobile Home ?                ?  ?  ?  ?  ?  ?  ?  ?  ?  ?  ? ?Prior Living Arrangements/Services ?Living arrangements for the past 2 months: Mobile Home ?Lives with:: Self, Domestic Partner ?Patient language and need for interpreter reviewed:: Yes ?Do you feel safe going back to the place where you live?: Yes      ?Need for Family Participation in Patient Care: No (Comment) ?Care giver support system in place?: Yes (comment) ?  ?Criminal Activity/Legal Involvement Pertinent to Current Situation/Hospitalization: No - Comment as needed ? ?Activities of Daily Living ?Home Assistive Devices/Equipment: None ?ADL Screening (condition at time of admission) ?Patient's cognitive ability adequate to safely complete daily activities?: Yes ?Is the patient deaf or have difficulty hearing?: No ?Does the patient have difficulty seeing, even when wearing glasses/contacts?: No ?Does the  patient have difficulty concentrating, remembering, or making decisions?: No ?Patient able to express need for assistance with ADLs?: Yes ?Does the patient have difficulty dressing or bathing?: No ?Independently performs ADLs?: Yes (appropriate for developmental age) ?Does the patient have difficulty walking or climbing stairs?: No ?Weakness of Legs: None ?Weakness of Arms/Hands: None ? ?Permission Sought/Granted ?Permission sought to share information with : Magazine features editor ?  ? Share Information with NAME: Estupinan,Cynthia Mother     [phone] ?   ?   ?   ? ?Emotional Assessment ?Appearance:: Appears older than stated age ?Attitude/Demeanor/Rapport: Engaged ?Affect (typically observed): Stable ?Orientation: : Oriented to Situation, Oriented to  Time, Oriented to Place, Oriented to Self ?  ?Psych Involvement: No (comment) ? ?Admission diagnosis:  Elevated troponin level [R77.8] ?Demand ischemia (HCC) [I24.8] ?Uncontrolled hypertension [I10] ?Chest pain [R07.9] ?Chest pain, unspecified type [R07.9] ?Patient Active Problem List  ? Diagnosis Date Noted  ? Chest pain 05/18/2021  ? Hypertensive urgency 05/18/2021  ? CAD (coronary artery disease) 05/18/2021  ? Loud snoring 03/28/2020  ? Gastroesophageal reflux disease without esophagitis   ? AKI (acute kidney injury) (HCC)   ? NSTEMI (non-ST elevated myocardial infarction) (HCC) 01/24/2020  ? Tobacco use 11/25/2019  ? COVID-19 virus infection 11/25/2019  ? Abnormal EKG 11/25/2019  ? 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 Penile lesion 11/25/2019  ? Essential hypertension   ? Hyperlipidemia   ? Overweight with body mass index (BMI) of 27  to 27.9 in adult   ? ?PCP:  Pcp, No ?Pharmacy:   ?[facility] [address] [place] - [address] ?[address] ?[place] [address] ?Phone: [phone] Fax: [phone] ? ? ? ? ?Social Determinants of Health (SDOH) Interventions ?  ? ?Readmission Risk Interventions ?   ? View : No data to display.  ?   ?  ?  ? ? ? ?

## 2021-05-19 NOTE — Hospital Course (Signed)
Logan Crawford is a 46 y.o. male with medical history significant of HTN, HLD, CAD, s/p of DES 2021, GERD, depression, obesity with BMI 28.85, medication noncompliance, tobacco abuse, who presented to the ED on 05/18/2021 with chest pain that had been intermittent for more than a week but worsened in severity and associated with shortness of breath.  Of note, patient reports poor compliance with medications including antihypertensives.  He had been using nitro with increasing frequency over days prior to presenting 1 nitro did not relieve the pain. ? ?In the ED, initial BP was 189/106, HR 110, initial troponin 101.  EKG was normal sinus rhythm with Q waves in the inferior leads, mild T wave inversions in the lateral leads, V5-V6 and inferior leads. ? ?Troponin trended up to 175.  Cardiology was consulted. ?Patient started on heparin drip with plan for cardiac cath on Monday ?

## 2021-05-19 NOTE — Progress Notes (Signed)
?Progress Note ? ? ?Patient: Logan Crawford JKQ:[id number] DOB: 1975-11-23 DOA: 05/18/2021     0 ?DOS: the patient was seen and examined on 05/19/2021 ?  ?Brief hospital course: ?[name] is a 46 y.o. male with medical history significant of HTN, HLD, CAD, s/p of DES 2021, GERD, depression, obesity with BMI 28.85, medication noncompliance, tobacco abuse, who presented to the ED on 05/18/2021 with chest pain that had been intermittent for more than a week but worsened in severity and associated with shortness of breath.  Of note, patient reports poor compliance with medications including antihypertensives.  He had been using nitro with increasing frequency over days prior to presenting 1 nitro did not relieve the pain. ? ?In the ED, initial BP was 189/106, HR 110, initial troponin 101.  EKG was normal sinus rhythm with Q waves in the inferior leads, mild T wave inversions in the lateral leads, V5-V6 and inferior leads. ? ?Troponin trended up to 175.  Cardiology was consulted. ?Patient started on heparin drip with plan for cardiac cath on Monday ? ?Assessment and Plan: ?* NSTEMI (non-ST elevated myocardial infarction) (HCC) ?With known history of CAD, unstable angina, prior stent.  Presented with persistent chest pain over several days, worsening in severity.  Initial troponin 110, up trended to 175.  EKG without ST elevation but did have T wave changes in lateral and inferior leads. ?-- Cardiology following ?-- Continue heparin drip, Nitropaste, as needed pain medication ?-- Cardiac cath planned for Monday ?-- Continue aspirin statin and beta-blocker ? ?Chest pain ?See NSTEMI ? ?CAD (coronary artery disease) ?Presenting with active chest pain, unstable angina.  History of prior stent to mid LAD October 2021.  Cardiology following.  See NSTEMI for assessment and plan. ? ?Hypertensive urgency ?Initial BP 184/106, due to medication noncompliance.  Resumed on antihypertensives as outlined. ? ?Overweight with body  mass index (BMI) of 27 to 27.9 in adult ?Body mass index is 29.33 kg/m?Marland Kitchen ?Recommend weight loss for cardiovascular risk reduction. ? ?Hyperlipidemia ?Continue high intensity statin, Lipitor 80 mg ? ?Essential hypertension ?Uncontrolled on admission due to medication noncompliance. ?-- Continue Imdur, Coreg, amlodipine ?-- Monitor BP and titrate meds for adequate control. ? ?Tobacco use ?Declines nicotine patch.  Ongoing counseling for smoking cessation.  Primary care follow-up. ? ? ? ? ?  ? ?Subjective: Patient awake resting in bed when seen today.  He denies any active chest pain but is feeling short of breath.  He had a nebulizer last night that he says helped.  Not currently wheezing.  Reports some mild nausea but no vomiting.  No other acute complaints.  He wonders if he has sleep apnea. ? ?Physical Exam: ?Vitals:  ? 05/19/21 0040 05/19/21 0450 05/19/21 0757 05/19/21 1210  ?BP:  139/81 (!) 137/91 112/73  ?Pulse:  88 82 82  ?Resp:  19 19 18   ?Temp:  98.8 ?F (37.1 ?C) 98 ?F (36.7 ?C) 98.5 ?F (36.9 ?C)  ?TempSrc:   Oral   ?SpO2: 99% 99% 97% 98%  ?Weight:      ?Height:      ? ? ?General exam: awake, alert, no acute distress ?HEENT: moist mucus membranes, hearing grossly normal  ?Respiratory system: CTAB, no wheezes, rales or rhonchi, normal respiratory effort. ?Cardiovascular system: normal S1/S2, RRR, no JVD, murmurs, rubs, gallops, no pedal edema.   ?Gastrointestinal system: soft, NT, ND, no HSM felt, +bowel sounds. ?Central nervous system: A&O x3. no gross focal neurologic deficits, normal speech ?Extremities: moves all, no edema,  normal tone ?Skin: dry, intact, normal temperature ?Psychiatry: normal mood, congruent affect, judgement and insight appear normal ? ? ? ? ?Data Reviewed: ? ?Labs reviewed and notable for troponin increased to 175 from initial 101.  Lipid panel notable for HDL 27, triglycerides 268, VLDL 54.  UDS positive for cocaine and opiates.  Echocardiogram is pending ? ? ?Disposition: ?Status is:  Inpatient ?Remains inpatient appropriate because: Severity of illness with ongoing evaluation not appropriate for outpatient setting.  Cardiac cath planned for tomorrow ?  ? ? Planned Discharge Destination: Home ? ? ? ?Time spent: 35 minutes ? ?Author: ?Pennie Banter, DO ?05/19/2021 3:37 PM ? ?For on call review [email or web address].  ?

## 2021-05-20 ENCOUNTER — Encounter
Admission: EM | Disposition: A | Discharge status: Home / Self Care | Payer: Self-pay | Source: Home / Self Care | Attending: Internal Medicine

## 2021-05-20 DIAGNOSIS — I2510 Atherosclerotic heart disease of native coronary artery without angina pectoris: Secondary | ICD-10-CM

## 2021-05-20 HISTORY — PX: CORONARY STENT INTERVENTION: CATH118234

## 2021-05-20 HISTORY — PX: CORONARY ULTRASOUND/IVUS: CATH118244

## 2021-05-20 HISTORY — PX: LEFT HEART CATH AND CORS/GRAFTS ANGIOGRAPHY: CATH118250

## 2021-05-20 LAB — CBC
HCT: 44.3 % (ref 39.0–52.0)
Hemoglobin: 14.9 g/dL (ref 13.0–17.0)
MCH: 28.9 pg (ref 26.0–34.0)
MCHC: 33.6 g/dL (ref 30.0–36.0)
MCV: 85.9 fL (ref 80.0–100.0)
Platelets: 302 10*3/uL (ref 150–400)
RBC: 5.16 MIL/uL (ref 4.22–5.81)
RDW: 14.2 % (ref 11.5–15.5)
WBC: 8.4 10*3/uL (ref 4.0–10.5)
nRBC: 0 % (ref 0.0–0.2)

## 2021-05-20 LAB — POCT ACTIVATED CLOTTING TIME
Activated Clotting Time: 317 seconds
Activated Clotting Time: 444 seconds

## 2021-05-20 LAB — GLUCOSE, CAPILLARY: Glucose-Capillary: 98 mg/dL (ref 70–99)

## 2021-05-20 LAB — HEPARIN LEVEL (UNFRACTIONATED): Heparin Unfractionated: 0.34 IU/mL (ref 0.30–0.70)

## 2021-05-20 SURGERY — LEFT HEART CATH AND CORS/GRAFTS ANGIOGRAPHY
Anesthesia: Moderate Sedation

## 2021-05-20 MED ORDER — SODIUM CHLORIDE 0.9 % IV SOLN: 250.0000 mL | INTRAVENOUS | Status: DC | PRN

## 2021-05-20 MED ORDER — SODIUM CHLORIDE 0.9 % IV SOLN: INTRAVENOUS | Status: AC

## 2021-05-20 MED ORDER — HEPARIN SODIUM (PORCINE) 1000 UNIT/ML IJ SOLN
INTRAMUSCULAR | Status: DC | PRN
  Administered 2021-05-20 (×2): 5000 [IU] via INTRAVENOUS

## 2021-05-20 MED ORDER — HEPARIN (PORCINE) IN NACL 1000-0.9 UT/500ML-% IV SOLN
INTRAVENOUS | Status: AC
  Filled 2021-05-20: qty 1000

## 2021-05-20 MED ORDER — SODIUM CHLORIDE 0.9% FLUSH: 3.0000 mL | INTRAVENOUS | Status: DC | PRN

## 2021-05-20 MED ORDER — MIDAZOLAM HCL 2 MG/2ML IJ SOLN
INTRAMUSCULAR | Status: DC | PRN
  Administered 2021-05-20: 1 mg via INTRAVENOUS

## 2021-05-20 MED ORDER — TICAGRELOR 90 MG PO TABS
ORAL_TABLET | ORAL | Status: AC
  Filled 2021-05-20: qty 1

## 2021-05-20 MED ORDER — NITROGLYCERIN 1 MG/10 ML FOR IR/CATH LAB
INTRA_ARTERIAL | Status: DC | PRN
  Administered 2021-05-20 (×3): 200 ug via INTRACORONARY

## 2021-05-20 MED ORDER — FENTANYL CITRATE (PF) 100 MCG/2ML IJ SOLN
INTRAMUSCULAR | Status: AC
  Filled 2021-05-20: qty 2

## 2021-05-20 MED ORDER — SODIUM CHLORIDE 0.9% FLUSH
3.0000 mL | Freq: Two times a day (BID) | INTRAVENOUS | Status: DC
  Administered 2021-05-20 – 2021-05-21 (×2): 3 mL via INTRAVENOUS

## 2021-05-20 MED ORDER — VERAPAMIL HCL 2.5 MG/ML IV SOLN
INTRAVENOUS | Status: DC | PRN
  Administered 2021-05-20: 2.5 mg via INTRA_ARTERIAL

## 2021-05-20 MED ORDER — HEPARIN (PORCINE) IN NACL 1000-0.9 UT/500ML-% IV SOLN
INTRAVENOUS | Status: DC | PRN
Start: 2021-05-20 — End: 2021-05-20
  Administered 2021-05-20 (×2): 500 mL

## 2021-05-20 MED ORDER — MIDAZOLAM HCL 2 MG/2ML IJ SOLN
INTRAMUSCULAR | Status: AC
  Filled 2021-05-20: qty 2

## 2021-05-20 MED ORDER — VERAPAMIL HCL 2.5 MG/ML IV SOLN
INTRAVENOUS | Status: AC
  Filled 2021-05-20: qty 2

## 2021-05-20 MED ORDER — ACETAMINOPHEN 325 MG PO TABS: 650.0000 mg | ORAL_TABLET | Freq: Four times a day (QID) | ORAL | Status: DC | PRN

## 2021-05-20 MED ORDER — HYDROCODONE-ACETAMINOPHEN 5-325 MG PO TABS
1.0000 | ORAL_TABLET | ORAL | Status: DC | PRN
  Administered 2021-05-21: 1 via ORAL
  Filled 2021-05-20: qty 1

## 2021-05-20 MED ORDER — TICAGRELOR 90 MG PO TABS
ORAL_TABLET | ORAL | Status: DC | PRN
  Administered 2021-05-20: 90 mg via ORAL

## 2021-05-20 MED ORDER — LIDOCAINE HCL (PF) 1 % IJ SOLN
INTRAMUSCULAR | Status: DC | PRN
  Administered 2021-05-20: 2 mL

## 2021-05-20 MED ORDER — LIDOCAINE HCL 1 % IJ SOLN
INTRAMUSCULAR | Status: AC
  Filled 2021-05-20: qty 20

## 2021-05-20 MED ORDER — IOHEXOL 300 MG/ML  SOLN
INTRAMUSCULAR | Status: DC | PRN
  Administered 2021-05-20: 155 mL

## 2021-05-20 MED ORDER — HEPARIN SODIUM (PORCINE) 1000 UNIT/ML IJ SOLN
INTRAMUSCULAR | Status: AC
  Filled 2021-05-20: qty 10

## 2021-05-20 MED ORDER — FENTANYL CITRATE (PF) 100 MCG/2ML IJ SOLN
INTRAMUSCULAR | Status: DC | PRN
  Administered 2021-05-20: 50 ug via INTRAVENOUS

## 2021-05-20 MED ORDER — NITROGLYCERIN 1 MG/10 ML FOR IR/CATH LAB
INTRA_ARTERIAL | Status: AC
  Filled 2021-05-20: qty 10

## 2021-05-20 SURGICAL SUPPLY — 25 items
BALLN TREK RX 2.5X12 (BALLOONS) ×2
BALLN ~~LOC~~ EUPHORA RX 3.25X20 (BALLOONS) ×2
BALLOON TREK RX 2.5X12 (BALLOONS) IMPLANT
BALLOON ~~LOC~~ EUPHORA RX 3.25X20 (BALLOONS) IMPLANT
CATH 5FR JR4 DIAGNOSTIC (CATHETERS) ×1 IMPLANT
CATH EAGLE EYE PLAT IMAGING (CATHETERS) ×1 IMPLANT
CATH JL3.5 FR DIAG (CATHETERS) ×1 IMPLANT
CATH LAUNCHER 5F EBU3.5 (CATHETERS) ×1 IMPLANT
CATH LAUNCHER 6FR EBU3.5 (CATHETERS) ×1 IMPLANT
DEVICE RAD COMP TR BAND LRG (VASCULAR PRODUCTS) ×1 IMPLANT
DRAPE BRACHIAL (DRAPES) ×1 IMPLANT
GLIDESHEATH SLEND SS 6F .021 (SHEATH) ×2 IMPLANT
GUIDEWIRE INQWIRE 1.5J.035X260 (WIRE) IMPLANT
INQWIRE 1.5J .035X260CM (WIRE) ×2
KIT ENCORE 26 ADVANTAGE (KITS) ×1 IMPLANT
PACK CARDIAC CATH (CUSTOM PROCEDURE TRAY) ×2 IMPLANT
PROTECTION STATION PRESSURIZED (MISCELLANEOUS) ×2
SET ATX SIMPLICITY (MISCELLANEOUS) ×1 IMPLANT
SHEATH AVANTI 5FR X 11CM (SHEATH) ×1 IMPLANT
STATION PROTECTION PRESSURIZED (MISCELLANEOUS) IMPLANT
STENT ONYX FRONTIER 2.5X08 (Permanent Stent) ×1 IMPLANT
STENT ONYX FRONTIER 2.75X34 (Permanent Stent) ×1 IMPLANT
TUBING CIL FLEX 10 FLL-RA (TUBING) ×1 IMPLANT
WIRE ASAHI PROWATER 180CM (WIRE) ×1 IMPLANT
WIRE RUNTHROUGH .014X180CM (WIRE) ×1 IMPLANT

## 2021-05-20 NOTE — Consult Note (Signed)
ANTICOAGULATION CONSULT NOTE ? ?Pharmacy Consult for heparin infusion ?Indication: chest pain/ACS ? ?No Known Allergies ? ?Patient Measurements: ?Height: 6' (182.9 cm) ?Weight: 98.3 kg (216 lb 11.4 oz) ?IBW/kg (Calculated) : 77.6 ?Heparin Dosing Weight: 96.5 kg ? ?Vital Signs: ?Temp: 98 ?F (36.7 ?C) (03/27 0406) ?Temp Source: Oral (03/26 2303) ?BP: 134/95 (03/27 0747) ?Pulse Rate: 84 (03/27 0747) ? ?Labs: ?Recent Labs  ?  05/18/21 ?0607 05/18/21 ?0711 05/18/21 ?0823 05/18/21 ?1026 05/18/21 ?1318 05/18/21 ?2026 05/19/21 ?2979 05/19/21 ?1613 05/20/21 ?8921  ?HGB 15.9  --   --   --   --   --  14.7  --  14.9  ?HCT 46.7  --   --   --   --   --  44.1  --  44.3  ?PLT 333  --   --   --   --   --  281  --  302  ?APTT  --  30  --   --   --   --   --   --   --   ?LABPROT  --  15.7*  --   --   --   --   --   --   --   ?INR  --  1.3*  --   --   --   --   --   --   --   ?HEPARINUNFRC  --   --   --   --    < > 0.52 0.47  --  0.34  ?CREATININE 1.11  --   --   --   --   --   --   --   --   ?TROPONINIHS 101*  --  110* 175*  --   --   --  131*  --   ? < > = values in this interval not displayed.  ? ? ? ?Estimated Creatinine Clearance: 100 mL/min (by C-G formula based on SCr of 1.11 mg/dL). ? ? ?Medical History: ?Past Medical History:  ?Diagnosis Date  ? CAD (coronary artery disease)   ? a. 11/2019 NSTEMI/PCI: LM min irregs, LAD 79m (2.75x22 Resolute Onyx DES), 40d, D2 min irregs, LCX/LPAV min irregs, RCA 60ost, 40p/m, EF 45-50%; b. 11/2020 MV: EF 43%, small area of apical ischemia ->? over processing-->low risk.  ? Hyperlipidemia   ? Hypertension   ? Ischemic cardiomyopathy   ? a. 11/2019 LV Gram: EF 45-50%; b. 12/2019 Echo: EF 50% w/ mod mid-apical anteroseptal HK, GrI DD, nl RV fxn, mild Ao sclerosis.  ? Tobacco use 11/25/2019  ? ? ?Medications:  ?PTA: No prior anticoagulation noted from chart review ?Inpatient: Heparin Drip (3/25 >>>) ?Allergies: NKDA ? ?Assessment: ?46 year old male with PHM of CAD, hypertension, MI with stent   presented with chest pain. Troponin 101 ? ? ?Date Time HL Rate/Comment ?03/25 2026 0.52 Therapeutic x1 ?3/26 0223 0.47 Therapeutic x 2 ?3/27 0634 0.34 Therapeutic.  ? ?Goal of Therapy:  ?Heparin level 0.3-0.7 units/ml ?Monitor platelets by anticoagulation protocol: Yes ?  ?Plan:  ?Heparin level is therapeutic. Will continue with heparin infusion at 1600 units/hr. Recheck heparin level and CBC with AM labs.  ? ?Paschal Dopp, PharmD, ?05/20/2021 ?7:49 AM ? ? ? ? ?

## 2021-05-20 NOTE — Progress Notes (Signed)
? ? ?Progress Note ? ?Patient Name: Logan Crawford ?Date of Encounter: 05/20/2021 ? ?Primary Cardiologist: Agbor-Etang ? ?Subjective  ? ?No chest pain. Dyspnea intermittent. He is for [facility] today.  ? ?Inpatient Medications  ?  ?Scheduled Meds: ? amLODipine  10 mg Oral Daily  ? aspirin EC  81 mg Oral Daily  ? atorvastatin  80 mg Oral Daily  ? carvedilol  6.25 mg Oral BID WC  ? isosorbide mononitrate  60 mg Oral Daily  ? multivitamin with minerals  1 tablet Oral Daily  ? nicotine  21 mg Transdermal Daily  ? pantoprazole  40 mg Oral Daily  ? pneumococcal 20-valent conjugate vaccine  0.5 mL Intramuscular Tomorrow-1000  ? sodium chloride flush  3 mL Intravenous Q12H  ? ticagrelor  90 mg Oral Q12H  ? ?Continuous Infusions: ? sodium chloride    ? sodium chloride 1 mL/kg/hr (05/20/21 0015)  ? heparin 1,600 Units/hr (05/20/21 0407)  ? ?PRN Meds: ?sodium chloride, acetaminophen, albuterol, hydrALAZINE, HYDROmorphone (DILAUDID) injection, nitroGLYCERIN, nitroGLYCERIN, ondansetron (ZOFRAN) IV, sodium chloride flush  ? ?Vital Signs  ?  ?Vitals:  ? 05/19/21 1933 05/19/21 2303 05/20/21 0406 05/20/21 0500  ?BP: 112/64 122/71 (!) 141/74   ?Pulse: 83 87 88   ?Resp: 17 12 19    ?Temp: 97.7 ?F (36.5 ?C) 97.7 ?F (36.5 ?C) 98 ?F (36.7 ?C)   ?TempSrc: Oral Oral    ?SpO2: 96% 97% 97%   ?Weight:    98.3 kg  ?Height:      ? ? ?Intake/Output Summary (Last 24 hours) at 05/20/2021 [id number] ?Last data filed at 05/20/2021 [id number] ?Gross per 24 hour  ?Intake 1977.13 ml  ?Output 3550 ml  ?Net -1572.87 ml  ? ?Filed Weights  ? 05/18/21 0600 05/18/21 0827 05/20/21 0500  ?Weight: 96.5 kg 98.1 kg 98.3 kg  ? ? ?Telemetry  ?  ?SR - Personally Reviewed ? ?ECG  ?  ?No new tracings - Personally Reviewed ? ?Physical Exam  ? ?GEN: No acute distress.   ?Neck: No JVD. ?Cardiac: RRR, no murmurs, rubs, or gallops.  ?Respiratory: Clear to auscultation bilaterally.  ?GI: Soft, nontender, non-distended.   ?MS: No edema; No deformity. ?Neuro:  Alert and oriented x 3; Nonfocal.   ?Psych: Normal affect. ? ?Labs  ?  ?Chemistry ?Recent Labs  ?Lab 05/18/21 ?0607  ?NA 137  ?K 4.0  ?CL 105  ?CO2 23  ?GLUCOSE 123*  ?BUN 12  ?CREATININE 1.11  ?CALCIUM 8.8*  ?PROT 7.3  ?ALBUMIN 3.8  ?AST 28  ?ALT 18  ?ALKPHOS 88  ?BILITOT 0.5  ?GFRNONAA >60  ?ANIONGAP 9  ?  ? ?Hematology ?Recent Labs  ?Lab 05/18/21 ?[id number] 05/19/21 ?0223 05/20/21 ?[id number]  ?WBC 9.0 7.1 8.4  ?RBC 5.54 5.09 5.16  ?HGB 15.9 14.7 14.9  ?HCT 46.7 44.1 44.3  ?MCV 84.3 86.6 85.9  ?MCH 28.7 28.9 28.9  ?MCHC 34.0 33.3 33.6  ?RDW 14.1 14.3 14.2  ?PLT 333 281 302  ? ? ?Cardiac EnzymesNo results for input(s): TROPONINI in the last 168 hours. No results for input(s): TROPIPOC in the last 168 hours.  ? ?BNPNo results for input(s): BNP, PROBNP in the last 168 hours.  ? ?DDimer No results for input(s): DDIMER in the last 168 hours.  ? ?Radiology  ?  ?DG Chest Port 1 View ? ?Result Date: 05/18/2021 ?IMPRESSION: 1. Suspected enlargement of the cardiac silhouette, potentially artifactual due to AP projection, though could be seen in the setting cardiomegaly and/or a pericardial effusion. Clinical correlation is  advised. Further evaluation with cardiac echo could be performed as indicated. 2. Otherwise, no acute cardiopulmonary disease. Electronically Signed   By: Sandi Mariscal M.D.   On: 05/18/2021 09:35  ? ?Cardiac Studies  ? ?2D echo 05/18/2021: ?1. Left ventricular ejection fraction, by estimation, is 50 to 55%. The  ?left ventricle has low normal function. The left ventricle demonstrates  ?regional wall motion abnormalities (hypokinesis of the mid to distal  ?anteroseptal/septal, apical region There  ? is moderate left ventricular hypertrophy. Left ventricular diastolic  ?parameters are consistent with Grade I diastolic dysfunction (impaired  ?relaxation).  ? 2. Right ventricular systolic function is normal. The right ventricular  ?size is normal. Tricuspid regurgitation signal is inadequate for assessing  ?PA pressure.  ? 3. The mitral valve is normal in  structure. Mild mitral valve  ?regurgitation. No evidence of mitral stenosis.  ? 4. The aortic valve was not well visualized. Aortic valve regurgitation  ?is not visualized. Aortic valve sclerosis is present, with no evidence of  ?aortic valve stenosis.  ? 5. The inferior vena cava is normal in size with greater than 50%  ?respiratory variability, suggesting right atrial pressure of 3 mmHg. ? ?Patient Profile  ?   ?46 y.o. male with history of CAD s/p LAD stenting in 11/2019, HTN, HLD, medication nonadherence, cocaine use and smoking who was admitted with an NSTEMI.  ? ?Assessment & Plan  ?  ?1. CAD involving the native coronary arteries with NSTEMI: ?-Currently, without angina ?-HS-Tn peaked at 175 ?-Heparin gtt ?-ASA, Brilinta, Coreg, amlodipine, Lipitor, Imdur ?-NPO ?-LHC today ?-Echo with low normal LVSF with anteroseptal/septal and apical hypokinesis (consistent with prior study) ? ?2. HTN: ?-Blood pressure reasonably controlled  ?-Medication nonadherence as an outpatient ?-Continue current therapy ? ?3. HLD: ?-LDL 57 ?-Lipitor ? ?4. Tobacco use/cocaine use: ?-Admits to using cocaine a couple of days prior to admission ?-Complete smoking and cocaine cessation is encouraged  ? ?5. Sleep disordered breathing: ?-Needs outpatient sleep study ? ? ?Shared Decision Making/Informed Consent{ ? ?The risks [stroke (1 in 1000), death (1 in 17), kidney failure [usually temporary] (1 in 500), bleeding (1 in 200), allergic reaction [possibly serious] (1 in 200)], benefits (diagnostic support and management of coronary artery disease) and alternatives of a cardiac catheterization were discussed in detail with Mr. Poznanski and he is willing to proceed.  ? ?For questions or updates, please contact [place] ?Please consult [email or web address] for contact info under Cardiology/STEMI. ?  ? ?Signed, ?Christell Faith, PA-C ?[facility] ?Pager: [phone] ?05/20/2021, 7:22 AM ? ?

## 2021-05-20 NOTE — Progress Notes (Signed)
Patient to his assigned room. Alert and oriented at time of face to face with 2A nurse [place]. Patient again instructed not to use the right arm for anything especially texting on his phone. Verbalized understanding.  ?

## 2021-05-20 NOTE — Progress Notes (Signed)
?Progress Note ? ? ?Patient: Logan Crawford [id number]:[id number] DOB: 1975-04-04 DOA: 05/18/2021     1 ?DOS: the patient was seen and examined on 05/20/2021 ?  ?Brief hospital course: ?Logan Crawford is a 46 y.o. male with medical history significant of HTN, HLD, CAD, s/p of DES 2021, GERD, depression, obesity with BMI 28.85, medication noncompliance, tobacco abuse, who presented to the ED on 05/18/2021 with chest pain that had been intermittent for more than a week but worsened in severity and associated with shortness of breath.  Of note, patient reports poor compliance with medications including antihypertensives.  He had been using nitro with increasing frequency over days prior to presenting 1 nitro did not relieve the pain. ? ?In the ED, initial BP was 189/106, HR 110, initial troponin 101.  EKG was normal sinus rhythm with Q waves in the inferior leads, mild T wave inversions in the lateral leads, V5-V6 and inferior leads. ? ?Troponin trended up to 175.  Cardiology was consulted. ?Patient started on heparin drip with plan for cardiac cath on Monday ? ?Assessment and Plan: ?* NSTEMI (non-ST elevated myocardial infarction) (HCC) ?With known history of CAD, unstable angina, prior stent.  Presented with persistent chest pain over several days, worsening in severity.  Initial troponin 110, up trended to 175.  EKG without ST elevation but did have T wave changes in lateral and inferior leads. ?-- Cardiology following ?-- Continue heparin drip, Nitropaste, as needed pain medication ?-- Cardiac cath today ?-- Continue aspirin statin and beta-blocker ? ?Chest pain ?See NSTEMI ? ?CAD (coronary artery disease) ?Presenting with active chest pain, unstable angina.  History of prior stent to mid LAD October 2021.  Cardiology following.  See NSTEMI for assessment and plan. ? ?Hypertensive urgency ?Initial BP 184/106, due to medication noncompliance.  Resumed on antihypertensives as outlined. ? ?Overweight with body mass index  (BMI) of 27 to 27.9 in adult ?Body mass index is 29.33 kg/m?Marland Kitchen ?Recommend weight loss for cardiovascular risk reduction. ? ?Hyperlipidemia ?Continue high intensity statin, Lipitor 80 mg ? ?Essential hypertension ?Uncontrolled on admission due to medication noncompliance. ?-- Continue Imdur, Coreg, amlodipine ?-- Monitor BP and titrate meds for adequate control. ? ?Tobacco use ?Declines nicotine patch.  Ongoing counseling for smoking cessation.  Primary care follow-up. ? ? ? ? ?  ? ?Subjective: Patient seen awake resting in bed this morning.  He says going for cardiac cath around lunchtime today.  Denies having any chest pain right now.  Intermittent mild shortness of breath but overall feeling better.  No other acute complaints at this time. ? ?Physical Exam: ?Vitals:  ? 05/20/21 1358 05/20/21 1403 05/20/21 1418 05/20/21 1430  ?BP: (!) 128/93 (!) 121/92 118/82 121/86  ?Pulse: 77 79 78 80  ?Resp: (!) 22 (!) 21 18 13   ?Temp:      ?TempSrc:      ?SpO2: 99% 99% 95% 98%  ?Weight:      ?Height:      ? ?General exam: awake, alert, no acute distress ?HEENT: atraumatic, clear conjunctiva, anicteric sclera, moist mucus membranes, hearing grossly normal  ?Respiratory system: CTAB, no wheezes, rales or rhonchi, normal respiratory effort. ?Cardiovascular system: normal S1/S2, RR, no JVD, murmurs, rubs, gallops, no pedal edema.   ?Gastrointestinal system: soft, NT, ND, no HSM felt, +bowel sounds. ?Central nervous system: A&O x3. no gross focal neurologic deficits, normal speech ?Extremities: moves all, no edema, normal tone ?Skin: dry, intact, normal temperature ?Psychiatry: normal mood, congruent affect, judgement and insight appear normal ? ? ? ?  Data Reviewed: ? ?Labs reviewed and unremarkable ? ?Family Communication: None at bedside on rounds.  Patient able to update ? ?Disposition: ?Status is: Inpatient ?Remains inpatient appropriate because: Ongoing diagnostic evaluation not appropriate for the outpatient setting, for cardiac  cath today. ? ? Planned Discharge Destination: Home ? ? ? ?Time spent: 35 minutes ? ?Author: ?Pennie Banter, DO ?05/20/2021 2:48 PM ? ?For on call review [email or web address].  ?

## 2021-05-21 ENCOUNTER — Encounter: Payer: Self-pay | Admitting: Cardiovascular Disease

## 2021-05-21 LAB — GLUCOSE, CAPILLARY: Glucose-Capillary: 105 mg/dL — ABNORMAL HIGH (ref 70–99)

## 2021-05-21 LAB — BASIC METABOLIC PANEL
Anion gap: 8 (ref 5–15)
BUN: 19 mg/dL (ref 6–20)
CO2: 27 mmol/L (ref 22–32)
Calcium: 9.3 mg/dL (ref 8.9–10.3)
Chloride: 102 mmol/L (ref 98–111)
Creatinine, Ser: 1.3 mg/dL — ABNORMAL HIGH (ref 0.61–1.24)
GFR, Estimated: >60 mL/min (ref >60)
Glucose, Bld: 90 mg/dL (ref 70–99)
Potassium: 4.3 mmol/L (ref 3.5–5.1)
Sodium: 137 mmol/L (ref 135–145)

## 2021-05-21 LAB — CBC
HCT: 45.7 % (ref 39.0–52.0)
Hemoglobin: 15.2 g/dL (ref 13.0–17.0)
MCH: 29 pg (ref 26.0–34.0)
MCHC: 33.3 g/dL (ref 30.0–36.0)
MCV: 87 fL (ref 80.0–100.0)
Platelets: 324 10*3/uL (ref 150–400)
RBC: 5.25 MIL/uL (ref 4.22–5.81)
RDW: 14.5 % (ref 11.5–15.5)
WBC: 8.9 10*3/uL (ref 4.0–10.5)
nRBC: 0 % (ref 0.0–0.2)

## 2021-05-21 MED ORDER — ISOSORBIDE MONONITRATE ER 60 MG PO TB24: 60.0000 mg | ORAL_TABLET | Freq: Every day | ORAL | 1 refills | Status: DC

## 2021-05-21 MED ORDER — MELATONIN 5 MG PO TABS: 5.0000 mg | ORAL_TABLET | Freq: Every evening | ORAL | Status: DC | PRN

## 2021-05-21 MED ORDER — AMLODIPINE BESYLATE 10 MG PO TABS: 10.0000 mg | ORAL_TABLET | Freq: Every day | ORAL | 1 refills | Status: DC

## 2021-05-21 MED ORDER — ATORVASTATIN CALCIUM 80 MG PO TABS: 80.0000 mg | ORAL_TABLET | Freq: Every day | ORAL | 1 refills | Status: DC

## 2021-05-21 NOTE — Discharge Summary (Addendum)
?Physician Discharge Summary ?  ?Patient: Logan Crawford MRN: [id number] DOB: September 25, 1975  ?Admit date:     05/18/2021  ?Discharge date: 05/21/2021  ?Discharge Physician: Ezekiel Slocumb  ? ?PCP: Pcp, No  ? ?Recommendations at discharge:  ? ?Follow-up with cardiology in 1 to 2 weeks ? ?Discharge Diagnoses: ?Principal Problem: ?  NSTEMI (non-ST elevated myocardial infarction) ([place]) ?Active Problems: ?  Chest pain ?  Tobacco use ?  Essential hypertension ?  Hyperlipidemia ?  Overweight with body mass index (BMI) of 27 to 27.9 in adult ?  Hypertensive urgency ?  CAD (coronary artery disease) ? ? ?Hospital Course: ?Logan Crawford is a 46 y.o. male with medical history significant of HTN, HLD, CAD, s/p of DES 2021, GERD, depression, obesity with BMI 28.85, medication noncompliance, tobacco abuse, who presented to the ED on 05/18/2021 with chest pain that had been intermittent for more than a week but worsened in severity and associated with shortness of breath.  Of note, patient reports poor compliance with medications including antihypertensives.  He had been using nitro with increasing frequency over days prior to presenting 1 nitro did not relieve the pain. ? ?In the ED, initial BP was 189/106, HR 110, initial troponin 101.  EKG was normal sinus rhythm with Q waves in the inferior leads, mild T wave inversions in the lateral leads, V5-V6 and inferior leads. ? ?Troponin trended up to 175.  Cardiology was consulted. ?Patient started on heparin drip with plan for cardiac cath on Monday ? ?Assessment and Plan: ?* NSTEMI (non-ST elevated myocardial infarction) ([place]) ?With known history of CAD, unstable angina, prior stent.  Presented with persistent chest pain over several days, worsening in severity.  Initial troponin 110, up trended to 175.  EKG without ST elevation but did have T wave changes in lateral and inferior leads. ?Cardiology consulted ?Treated with heparin drip, Nitropaste, as needed pain medication ?Cardiac  cath on 3/27 - DES placed to LAD ? ?-DAPT with ASA and Brilinta without interruption for at least 12 months with continuation of ASA indefinitely  ?-Coreg, amlodipine, Imdur, Lipitor ?-Aggressive risk factor modification and secondary prevention ?-Post cath instructions ?-Cardiac rehab ?-Cardiology follow up in 1-2 weeks ? ?Chest pain ?See NSTEMI ? ?CAD (coronary artery disease) ?Presenting with active chest pain, unstable angina.  History of prior stent to mid LAD October 2021.  Cardiology following.  See NSTEMI for assessment and plan. ? ?Hypertensive urgency ?Initial BP 184/106, due to medication noncompliance.  Resumed on antihypertensives as outlined. ? ?Overweight with body mass index (BMI) of 27 to 27.9 in adult ?Body mass index is 29.33 kg/m?[place] Kitchen ?Recommend weight loss for cardiovascular risk reduction. ? ?Hyperlipidemia ?Continue high intensity statin, Lipitor 80 mg ? ?Essential hypertension ?Uncontrolled on admission due to medication noncompliance. ?-- Continue Imdur, Coreg, amlodipine ?-- Monitor BP and titrate meds for adequate control. ? ?Tobacco use ?Declines nicotine patch.  Ongoing counseling for smoking cessation.  Primary care follow-up. ? ? ? ? ?  ? ? ?Consultants: cardiology ?Procedures performed: echo, left heart cath, stent placed  ?Disposition: Home ?Diet recommendation:  ?Cardiac diet ?DISCHARGE MEDICATION: ?Allergies as of 05/21/2021   ?No Known Allergies ?  ? ?  ?Medication List  ?  ? ?STOP taking these medications   ? ?sertraline 50 MG tablet ?Commonly known as: ZOLOFT ?  ? ?  ? ?TAKE these medications   ? ?albuterol 108 (90 Base) MCG/ACT inhaler ?Commonly known as: VENTOLIN HFA ?Inhale 2 puffs into the lungs every 6 (  six) hours as needed for wheezing or shortness of breath. ?  ?amLODipine 10 MG tablet ?Commonly known as: NORVASC ?Take 1 tablet (10 mg total) by mouth daily. ?  ?aspirin 81 MG EC tablet ?Take 1 tablet (81 mg total) by mouth daily. Swallow whole. ?  ?atorvastatin 80 MG  tablet ?Commonly known as: LIPITOR ?Take 1 tablet (80 mg total) by mouth daily. ?  ?carvedilol 6.25 MG tablet ?Commonly known as: COREG ?Take 1 tablet (6.25 mg total) by mouth 2 (two) times daily with a meal. ?  ?isosorbide mononitrate 60 MG 24 hr tablet ?Commonly known as: IMDUR ?Take 1 tablet (60 mg total) by mouth daily. ?  ?multivitamin tablet ?Take 1 tablet by mouth daily. ?  ?nitroGLYCERIN 0.4 MG SL tablet ?Commonly known as: NITROSTAT ?Place 1 tablet (0.4 mg total) under the tongue every 5 (five) minutes x 3 doses as needed for chest pain. ?  ?pantoprazole 40 MG tablet ?Commonly known as: PROTONIX ?Take 1 tablet (40 mg total) by mouth daily. ?  ?ticagrelor 90 MG Tabs tablet ?Commonly known as: BRILINTA ?Take 1 tablet (90 mg total) by mouth every 12 (twelve) hours. ?  ?VITAMIN B-3 PO ?Take by mouth daily. ?  ? ?  ? ? ?Discharge Exam: ?Filed Weights  ? 05/18/21 0600 05/18/21 0827 05/20/21 0500  ?Weight: 96.5 kg 98.1 kg 98.3 kg  ? ?General exam: awake, alert, no acute distress ?HEENT: atraumatic, clear conjunctiva, anicteric sclera, moist mucus membranes, hearing grossly normal  ?Respiratory system: CTAB, no wheezes, rales or rhonchi, normal respiratory effort. ?Cardiovascular system: normal S1/S2,  RRR, no JVD, murmurs, rubs, gallops,  no pedal edema.   ?Gastrointestinal system: soft, NT, ND, no HSM felt, +bowel sounds. ?Central nervous system: A&O x3. no gross focal neurologic deficits, normal speech ?Extremities: moves all , no edema, normal tone ?Skin: dry, intact, normal temperature, normal color, No rashes, lesions or ulcers ?Psychiatry: normal mood, congruent affect, judgement and insight appear normal ? ? ?Condition at discharge: stable ? ?The results of significant diagnostics from this hospitalization (including imaging, microbiology, ancillary and laboratory) are listed below for reference.  ? ?Imaging Studies: ?CARDIAC CATHETERIZATION ? ?Addendum Date: 05/20/2021   ?  Dist LAD lesion is 40% stenosed.    Prox RCA to Mid RCA lesion is 40% stenosed.   Ost RCA to Prox RCA lesion is 50% stenosed.   RPDA lesion is 40% stenosed.   Ost LM lesion is 30% stenosed.   Mid LAD lesion is 85% stenosed.   2nd Diag lesion is 60% stenosed.   A drug-eluting stent was successfully placed using a [place] [id number].   Balloon angioplasty was performed using a BALL[place] TREK RX 2.5X12.   Post intervention, there is a 0% residual stenosis.   Post intervention, there is a 30% residual stenosis.   LV end diastolic pressure is moderately elevated.   The left ventricular ejection fraction is 50-55% by visual estimate. 1.  Severe one-vessel coronary artery disease due to distal edge restenosis of the previously placed stent in the left anterior descending artery.  In addition, there is extensive plaque distal to the stent involving the whole mid segment.  Stable moderate right coronary artery disease. 2.  Low normal LV systolic function with an EF of 50 to 55% with mild anterior apical hypokinesis.  Moderately elevated left ventricular end-diastolic pressure. 3.  Successful complex IVUS guided bifurcation angioplasty and drug-eluting stent placement to the mid LAD.  IVUS before PCI showed stent underexpansion distally as  well as extensive plaque distal to the stent.  No stent fractures.  Another stent was added distally due to suspected distal edge dissection.  Balloon angioplasty was performed and the ostial second diagonal before placing the stent. Recommendations: Dual antiplatelet therapy for at least 1 year. Aggressive treatment of risk factors. I discussed with the patient the importance of compliance with medications. AVOID catheterization via the right radial artery in the future due to significant radial artery spasm.  Only a 5 [place] guide could be advanced. ? ?Result Date: 05/20/2021 ?  Dist LAD lesion is 40% stenosed.   Prox RCA to Mid RCA lesion is 40% stenosed.   Ost RCA to Prox RCA lesion is 50% stenosed.   RPDA lesion is  40% stenosed.   Ost LM lesion is 30% stenosed.   Mid LAD lesion is 85% stenosed.   2nd Diag lesion is 60% stenosed.   A drug-eluting stent was successfully placed using a [place] [id number].   [place]

## 2021-05-21 NOTE — Progress Notes (Signed)
? ? ?Progress Note ? ?Patient Name: Logan Crawford ?Date of Encounter: 05/21/2021 ? ?Primary Cardiologist: Agbor[place]Etang ? ?Subjective  ? ?No chest pain since admission. Dyspnea improved following PCI. Post cath labs and vitals stable.  ? ?Inpatient Medications  ?  ?Scheduled Meds: ? amLODipine  10 mg Oral Daily  ? aspirin EC  81 mg Oral Daily  ? atorvastatin  80 mg Oral Daily  ? carvedilol  6.25 mg Oral BID WC  ? isosorbide mononitrate  60 mg Oral Daily  ? multivitamin with minerals  1 tablet Oral Daily  ? nicotine  21 mg Transdermal Daily  ? pantoprazole  40 mg Oral Daily  ? pneumococcal 20-valent conjugate vaccine  0.5 mL Intramuscular Tomorrow-1000  ? sodium chloride flush  3 mL Intravenous Q12H  ? sodium chloride flush  3 mL Intravenous Q12H  ? ticagrelor  90 mg Oral Q12H  ? ?Continuous Infusions: ? sodium chloride    ? ?PRN Meds: ?sodium chloride, acetaminophen, albuterol, hydrALAZINE, HYDROcodone-acetaminophen, HYDROmorphone (DILAUDID) injection, melatonin, nitroGLYCERIN, nitroGLYCERIN, ondansetron (ZOFRAN) IV, sodium chloride flush  ? ?Vital Signs  ?  ?Vitals:  ? 05/20/21 1943 05/21/21 0001 05/21/21 0447 05/21/21 0728  ?BP: 114/80 133/88 109/77 127/81  ?Pulse: 79 86 78 81  ?Resp: 20 20 14 16   ?Temp: (!) 97.5 ?F (36.4 ?C) 97.6 ?F (36.4 ?C) 97.7 ?F (36.5 ?C) 98 ?F (36.7 ?C)  ?TempSrc: Oral Oral Oral   ?SpO2: 97% 100% 97% 98%  ?Weight:      ?Height:      ? ? ?Intake/Output Summary (Last 24 hours) at 05/21/2021 0831 ?Last data filed at 05/21/2021 0000 ?Gross per 24 hour  ?Intake 434.93 ml  ?Output --  ?Net 434.93 ml  ? ?Filed Weights  ? 05/18/21 0600 05/18/21 0827 05/20/21 0500  ?Weight: 96.5 kg 98.1 kg 98.3 kg  ? ? ?Telemetry  ?  ?SR - Personally Reviewed ? ?ECG  ?  ?No new tracings available for review - Personally Reviewed ? ?Physical Exam  ? ?GEN: No acute distress.   ?Neck: No JVD. ?Cardiac: RRR, no murmurs, rubs, or gallops. Right radial arteriotomy site without active bleeding, bruising, swelling, warmth,  erythema, swelling, or TTP. Radial pulse 2+ proximal and distal to the arteriotomy site.  ?Respiratory: Clear to auscultation bilaterally.  ?GI: Soft, nontender, non-distended.   ?MS: No edema; No deformity. ?Neuro:  Alert and oriented x 3; Nonfocal.  ?Psych: Normal affect. ? ?Labs  ?  ?Chemistry ?Recent Labs  ?Lab 05/18/21 ?[id number] 05/21/21 ?[id number]  ?NA 137 137  ?K 4.0 4.3  ?CL 105 102  ?CO2 23 27  ?GLUCOSE 123* 90  ?BUN 12 19  ?CREATININE 1.11 1.30*  ?CALCIUM 8.8* 9.3  ?PROT 7.3  --   ?ALBUMIN 3.8  --   ?AST 28  --   ?ALT 18  --   ?ALKPHOS 88  --   ?BILITOT 0.5  --   ?GFRNONAA >60 >60  ?ANIONGAP 9 8  ?  ? ?Hematology ?Recent Labs  ?Lab 05/19/21 ?0223 05/20/21 ?[id number] 05/21/21 ?[id number]  ?WBC 7.1 8.4 8.9  ?RBC 5.09 5.16 5.25  ?HGB 14.7 14.9 15.2  ?HCT 44.1 44.3 45.7  ?MCV 86.6 85.9 87.0  ?MCH 28.9 28.9 29.0  ?MCHC 33.3 33.6 33.3  ?RDW 14.3 14.2 14.5  ?PLT 281 302 324  ? ? ?Cardiac EnzymesNo results for input(s): TROPONINI in the last 168 hours. No results for input(s): TROPIPOC in the last 168 hours.  ? ?BNPNo results for input(s): BNP, PROBNP in the  last 168 hours.  ? ?DDimer No results for input(s): DDIMER in the last 168 hours.  ? ?Radiology  ?  ?DG Chest Port 1 View ?  ?Result Date: 05/18/2021 ?IMPRESSION: 1. Suspected enlargement of the cardiac silhouette, potentially artifactual due to AP projection, though could be seen in the setting cardiomegaly and/or a pericardial effusion. Clinical correlation is advised. Further evaluation with cardiac echo could be performed as indicated. 2. Otherwise, no acute cardiopulmonary disease. Electronically Signed   By: Sandi Mariscal M.D.   On: 05/18/2021 09:35  ? ?Cardiac Studies  ? ?2D echo 05/18/2021: ?1. Left ventricular ejection fraction, by estimation, is 50 to 55%. The  ?left ventricle has low normal function. The left ventricle demonstrates  ?regional wall motion abnormalities (hypokinesis of the mid to distal  ?anteroseptal/septal, apical region There  ? is moderate left  ventricular hypertrophy. Left ventricular diastolic  ?parameters are consistent with Grade I diastolic dysfunction (impaired  ?relaxation).  ? 2. Right ventricular systolic function is normal. The right ventricular  ?size is normal. Tricuspid regurgitation signal is inadequate for assessing  ?PA pressure.  ? 3. The mitral valve is normal in structure. Mild mitral valve  ?regurgitation. No evidence of mitral stenosis.  ? 4. The aortic valve was not well visualized. Aortic valve regurgitation  ?is not visualized. Aortic valve sclerosis is present, with no evidence of  ?aortic valve stenosis.  ? 5. The inferior vena cava is normal in size with greater than 50%  ?respiratory variability, suggesting right atrial pressure of 3 mmHg. ?__________ ? ?LHC 05/20/2021: ?  Dist LAD lesion is 40% stenosed. ?  Prox RCA to Mid RCA lesion is 40% stenosed. ?  Ost RCA to Prox RCA lesion is 50% stenosed. ?  RPDA lesion is 40% stenosed. ?  Ost LM lesion is 30% stenosed. ?  Mid LAD lesion is 85% stenosed. ?  2nd Diag lesion is 60% stenosed. ?  A drug-eluting stent was successfully placed using a [place] 2.75X34. ?  Balloon angioplasty was performed using a BALLN TREK RX 2.5X12. ?  Post intervention, there is a 0% residual stenosis. ?  Post intervention, there is a 30% residual stenosis. ?  LV end diastolic pressure is moderately elevated. ?  The left ventricular ejection fraction is 50-55% by visual estimate. ?  ?1.  Severe one-vessel coronary artery disease due to distal edge restenosis of the previously placed stent in the left anterior descending artery.  In addition, there is extensive plaque distal to the stent involving the whole mid segment.  Stable moderate right coronary artery disease. ?2.  Low normal LV systolic function with an EF of 50 to 55% with mild anterior apical hypokinesis.  Moderately elevated left ventricular end-diastolic pressure. ?3.  Successful complex IVUS guided bifurcation angioplasty and  drug-eluting stent placement to the mid LAD.  IVUS before PCI showed stent underexpansion distally as well as extensive plaque distal to the stent.  No stent fractures.  Another stent was added distally due to suspected distal edge dissection.  Balloon angioplasty was performed and the ostial second diagonal before placing the stent.  ?  ?Recommendations: ?Dual antiplatelet therapy for at least 1 year. ?Aggressive treatment of risk factors. ?I discussed with the patient the importance of compliance with medications. ?AVOID catheterization via the right radial artery in the future due to significant radial artery spasm.  Only a 5 [place] guide could be advanced. ? ?Patient Profile  ?   ?46 y.o. male with history of  CAD s/p LAD stenting in 11/2019, HTN, HLD, medication nonadherence, cocaine use and smoking who was admitted with an NSTEMI.  ? ?Assessment & Plan  ?  ?1. CAD involving the native coronary arteries with NSTEMI: ?-Status post successful complex IVUS-guided bifurcation PCI/DES to the mid LAD as outlined above with a 2nd stent being placed distally due to suspected distal edge dissection and PTCA of the ostial D2 prior to stent placement of the LAD ?-DAPT with ASA and Brilinta without interruption for at least 12 months with continuation of ASA indefinitely  ?-Coreg, amlodipine, Imdur, Lipitor ?-Aggressive risk factor modification and secondary prevention ?-Post cath instructions ?-Cardiac rehab ? ?2. HTN: ?-Blood pressure is well controlled ?-Medication nonadherence as an outpatient ?-Continue current therapy ?  ?3. HLD: ?-LDL 57 ?-Lipitor ?  ?4. Tobacco use/cocaine use: ?-Admits to using cocaine a couple of days prior to admission ?-Declines nicotine patch at discharge  ?-Complete smoking and cocaine cessation is encouraged  ?  ?5. Sleep disordered breathing: ?-Needs outpatient sleep study ? ? ?We will arrange for follow up in our office in 1-2 weeks.  ? ? ? ?For questions or updates, please contact [place] ?Please consult [email or web address] for contact info under Cardiology/STEMI. ?  ? ?Signed, ?Christell Faith, PA-C ?[facility] ?Pager: [phone] ?05/21/2021, 8:31 AM ? ?

## 2021-05-29 NOTE — Progress Notes (Signed)
?Cardiology Office Note:   ? ?Date:  05/30/2021  ? ?ID:  Logan Crawford, DOB 1975-09-08, MRN [id number] ? ?PCP:  Pcp, No  ?[facility] Cardiologist:  Kate Sable, MD  ?[place] Electrophysiologist:  None  ? ?Referring MD: No ref. provider found  ? ?Chief Complaint: Hospital follow-up ? ?History of Present Illness:   ? ?Logan Crawford is a 46 y.o. male with a hx of  CAD s/p LAD stenting in 11/2019, HTN, HLD, medication nonadherence, cocaine use and smoking who was admitted with an NSTEMI.  ? ? He presented in 11/2019 w/ chest pain and ruled in for NSTEMI.  Echo showed and EF of 45-50% w/ moderate mid-apical anteroseptal HK.  Cath revealed severe mid LAD dzs, which was successfully treated w/ a DES.  He had moderate residual ostial/prox RCA dzs, which was medically managed.  At f/u in 04/2020, he reported ongoing intermittent c/p.  Stress testing was ordered, however, pt deferred scheduling until 11/2020, at which time lexiscan myoview was performed and low-risk w/ a small area of apical ischemia, which was present on attenuation correction imaging, and felt to be 2/2 over processing. ? ?The patient was recently admitted for NSTEMI. Reported noncompliance with medications and admitted cocaine use a few days prior to admission. He underwent cardiac cath and was treated with successful complex IVUS-guided bifurcation PCI/DES to the mid LAD as outlined in the report with a 2nd stent being placed distally due to suspected distal edge dissection and PTCA of the ostial D2 prior to stent placement of the LAD. He was started on DAPT with ASA and Brilinta for 12 months.  ? ?Today, the patient reports he has been doing well since being home. He didn't have an episode of chest pain and radiating left arm pain. He took SL NTG with improvement. Has minimal DOE. NO LLE. He has trouble sleeping due to sleep apnea, needs referral. Cath site, right radial is stable. He denies recent tobacco and drug use. He has two  boys that go to the gym and plans on doing this in the future. 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  ? ?Past Medical History:  ?Diagnosis Date  ? CAD (coronary artery disease)   ? a. 11/2019 NSTEMI/PCI: LM min irregs, LAD 30m (2.75x22 Resolute Onyx DES), 40d, D2 min irregs, LCX/LPAV min irregs, RCA 60ost, 40p/m, EF 45-50%; b. 11/2020 MV: EF 43%, small area of apical ischemia ->? over processing-->low risk.  ? Hyperlipidemia   ? Hypertension   ? Ischemic cardiomyopathy   ? a. 11/2019 LV Gram: EF 45-50%; b. 12/2019 Echo: EF 50% w/ mod mid-apical anteroseptal HK, GrI DD, nl RV fxn, mild Ao sclerosis.  ? Tobacco use 11/25/2019  ? ? ?Past Surgical History:  ?Procedure Laterality Date  ? CORONARY STENT INTERVENTION N/A 01/24/2020  ? Procedure: CORONARY STENT INTERVENTION;  Surgeon: Wellington Hampshire, MD;  Location: [place] [facility];  Service: Cardiovascular;  Laterality: N/A;  ? CORONARY STENT INTERVENTION N/A 05/20/2021  ? Procedure: CORONARY STENT INTERVENTION;  Surgeon: Wellington Hampshire, MD;  Location: [place] [facility];  Service: Cardiovascular;  Laterality: N/A;  ? INTRAVASCULAR ULTRASOUND/IVUS N/A 05/20/2021  ? Procedure: Intravascular Ultrasound/IVUS;  Surgeon: Wellington Hampshire, MD;  Location: [place] [facility];  Service: Cardiovascular;  Laterality: N/A;  ? IRRIGATION AND DEBRIDEMENT KNEE Left   ? LEFT HEART CATH AND CORONARY ANGIOGRAPHY N/A 01/24/2020  ? Procedure: LEFT HEART CATH AND CORONARY ANGIOGRAPHY;  Surgeon: [name]  A, MD;  Location: [place] [facility];  Service: Cardiovascular;  Laterality: N/A;  ? LEFT HEART CATH AND CORS/GRAFTS ANGIOGRAPHY N/A 05/20/2021  ? Procedure: LEFT HEART CATH AND CORS/GRAFTS ANGIOGRAPHY;  Surgeon: Wellington Hampshire, MD;  Location: [place] [facility];  Service: Cardiovascular;  Laterality: N/A;  ? TYMPANOSTOMY TUBE PLACEMENT Bilateral   ? ? ?Current Medications: ?Current Meds  ?Medication Sig  ? albuterol (VENTOLIN HFA) 108 (90 Base)  MCG/ACT inhaler Inhale 2 puffs into the lungs every 6 (six) hours as needed for wheezing or shortness of breath.  ? amLODipine (NORVASC) 10 MG tablet Take 1 tablet (10 mg total) by mouth daily.  ? aspirin EC 81 MG EC tablet Take 1 tablet (81 mg total) by mouth daily. Swallow whole.  ? atorvastatin (LIPITOR) 80 MG tablet Take 1 tablet (80 mg total) by mouth daily.  ? carvedilol (COREG) 6.25 MG tablet Take 1 tablet (6.25 mg total) by mouth 2 (two) times daily with a meal.  ? Multiple Vitamin (MULTIVITAMIN) tablet Take 1 tablet by mouth daily.  ? Niacin (VITAMIN B-3 PO) Take by mouth daily.  ? nitroGLYCERIN (NITROSTAT) 0.4 MG SL tablet Place 1 tablet (0.4 mg total) under the tongue every 5 (five) minutes x 3 doses as needed for chest pain.  ? ticagrelor (BRILINTA) 90 MG TABS tablet Take 1 tablet (90 mg total) by mouth every 12 (twelve) hours.  ? [DISCONTINUED] isosorbide mononitrate (IMDUR) 60 MG 24 hr tablet Take 1 tablet (60 mg total) by mouth daily.  ?  ? ?Allergies:   Patient has no known allergies.  ? ?Social History  ? ?Socioeconomic History  ? Marital status: Single  ?  Spouse name: Not on file  ? Number of children: Not on file  ? Years of education: Not on file  ? Highest education level: Not on file  ?Occupational History  ? Not on file  ?Tobacco Use  ? Smoking status: Former  ?  Packs/day: 1.00  ?  Years: 22.00  ?  Pack years: 22.00  ?  Types: Cigarettes  ?  Quit date: 01/23/2020  ?  Years since quitting: 1.3  ? Smokeless tobacco: Never  ?Vaping Use  ? Vaping Use: Never used  ?Substance and Sexual Activity  ? Alcohol use: Yes  ?  Comment: rarely  ? Drug use: No  ? Sexual activity: Not on file  ?Other Topics Concern  ? Not on file  ?Social History Narrative  ? Not on file  ? ?Social Determinants of Health  ? ?Financial Resource Strain: Not on file  ?Food Insecurity: Not on file  ?Transportation Needs: Not on file  ?Physical Activity: Not on file  ?Stress: Not on file  ?Social Connections: Not on file  ?   ? ?Family History: ?The patient's family history includes Arrhythmia in his mother; Coronary artery disease in his father and mother; Heart failure in his mother. ? ?ROS:   ?Please see the history of present illness.    ? All other systems reviewed and are negative. ? ?EKGs/Labs/Other Studies Reviewed:   ? ?The following studies were reviewed today: ? ?2D echo 05/18/2021: ?1. Left ventricular ejection fraction, by estimation, is 50 to 55%. The  ?left ventricle has low normal function. The left ventricle demonstrates  ?regional wall motion abnormalities (hypokinesis of the mid to distal  ?anteroseptal/septal, apical region There  ? is moderate left ventricular hypertrophy. Left ventricular diastolic  ?parameters are consistent with Grade I diastolic dysfunction (impaired  ?relaxation).  ?  2. Right ventricular systolic function is normal. The right ventricular  ?size is normal. Tricuspid regurgitation signal is inadequate for assessing  ?PA pressure.  ? 3. The mitral valve is normal in structure. Mild mitral valve  ?regurgitation. No evidence of mitral stenosis.  ? 4. The aortic valve was not well visualized. Aortic valve regurgitation  ?is not visualized. Aortic valve sclerosis is present, with no evidence of  ?aortic valve stenosis.  ? 5. The inferior vena cava is normal in size with greater than 50%  ?respiratory variability, suggesting right atrial pressure of 3 mmHg. ?__________ ?  ?LHC 05/20/2021: ?  Dist LAD lesion is 40% stenosed. ?  Prox RCA to Mid RCA lesion is 40% stenosed. ?  Ost RCA to Prox RCA lesion is 50% stenosed. ?  RPDA lesion is 40% stenosed. ?  Ost LM lesion is 30% stenosed. ?  Mid LAD lesion is 85% stenosed. ?  2nd Diag lesion is 60% stenosed. ?  A drug-eluting stent was successfully placed using a [place] 2.75X34. ?  Balloon angioplasty was performed using a BALLN TREK RX 2.5X12. ?  Post intervention, there is a 0% residual stenosis. ?  Post intervention, there is a 30% residual  stenosis. ?  LV end diastolic pressure is moderately elevated. ?  The left ventricular ejection fraction is 50-55% by visual estimate. ?  ?1.  Severe one-vessel coronary artery disease due to distal edge restenosis of

## 2021-05-30 ENCOUNTER — Ambulatory Visit (INDEPENDENT_AMBULATORY_CARE_PROVIDER_SITE_OTHER): Payer: Self-pay | Admitting: Medical

## 2021-05-30 ENCOUNTER — Encounter: Payer: Self-pay | Admitting: Medical

## 2021-05-30 VITALS — BP 138/88 | HR 89 | Ht 72.0 in | Wt 214.6 lb

## 2021-05-30 DIAGNOSIS — F1490 Cocaine use, unspecified, uncomplicated: Secondary | ICD-10-CM

## 2021-05-30 DIAGNOSIS — E785 Hyperlipidemia, unspecified: Secondary | ICD-10-CM

## 2021-05-30 DIAGNOSIS — I2510 Atherosclerotic heart disease of native coronary artery without angina pectoris: Secondary | ICD-10-CM

## 2021-05-30 DIAGNOSIS — I214 Non-ST elevation (NSTEMI) myocardial infarction: Secondary | ICD-10-CM

## 2021-05-30 DIAGNOSIS — R0683 Snoring: Secondary | ICD-10-CM

## 2021-05-30 DIAGNOSIS — I10 Essential (primary) hypertension: Secondary | ICD-10-CM

## 2021-05-30 DIAGNOSIS — Z720 Tobacco use: Secondary | ICD-10-CM

## 2021-05-30 MED ORDER — ISOSORBIDE MONONITRATE ER 60 MG PO TB24: 90.0000 mg | ORAL_TABLET | Freq: Every day | ORAL | 5 refills | Status: DC

## 2021-05-30 NOTE — Patient Instructions (Signed)
Medication Instructions:  ?Your physician has recommended you make the following change in your medication:  ? ?INCREASE Imdur to 90 mg (1 and 1/2 tablet) daily. An Rx has been sent to your pharmacy. ? ?*If you need a refill on your cardiac medications before your next appointment, please call your pharmacy* ? ? ?Lab Work: ?None ordered ?If you have labs (blood work) drawn today and your tests are completely normal, you will receive your results only by: ?MyChart Message (if you have MyChart) OR ?A paper copy in the mail ?If you have any lab test that is abnormal or we need to change your treatment, we will call you to review the results. ? ? ?Testing/Procedures: ?None ordered ? ? ?Follow-Up: ?At [facility], you and your health needs are our priority.  As part of our continuing mission to provide you with exceptional heart care, we have created designated Provider Care Teams.  These Care Teams include your primary Cardiologist (physician) and Advanced Practice Providers (APPs -  Physician Assistants and Nurse Practitioners) who all work together to provide you with the care you need, when you need it. ? ?We recommend signing up for the patient portal called "MyChart".  Sign up information is provided on this After Visit Summary.  MyChart is used to connect with patients for Virtual Visits (Telemedicine).  Patients are able to view lab/test results, encounter notes, upcoming appointments, etc.  Non-urgent messages can be sent to your provider as well.   ?To learn more about what you can do with MyChart, go to [email or web address].   ? ?Your next appointment:   ?4 week(s) ? ?The format for your next appointment:   ?In Person ? ?Provider:   ?You may see Debbe Odea, MD or one of the following Advanced Practice Providers on your designated Care Team:   ?Nicolasa Ducking, NP ?Eula Listen, PA-C ?Cadence Fransico Michael, PA-C{ ? ? ? ?Other Instructions ?You have been referred to [facility] Pulmonology for a sleep study  consult. They will call you to schedule. ? ?You have been referred to [facility] Cardiac Rehab. They will call you to schedule. ? ? ? ?

## 2021-06-26 ENCOUNTER — Ambulatory Visit: Payer: Self-pay | Admitting: Primary Care

## 2021-06-27 ENCOUNTER — Ambulatory Visit
Admission: RE | Admit: 2021-06-27 | Discharge: 2021-06-27 | Disposition: A | Discharge status: Home / Self Care | Payer: Self-pay | Attending: Medical | Admitting: Medical

## 2021-06-27 ENCOUNTER — Encounter: Payer: Self-pay | Admitting: Medical

## 2021-06-27 ENCOUNTER — Other Ambulatory Visit
Admission: RE | Admit: 2021-06-27 | Discharge: 2021-06-27 | Disposition: A | Discharge status: Home / Self Care | Payer: Self-pay | Source: Home / Self Care | Attending: Medical | Admitting: Medical

## 2021-06-27 ENCOUNTER — Ambulatory Visit (INDEPENDENT_AMBULATORY_CARE_PROVIDER_SITE_OTHER): Payer: Self-pay | Admitting: Medical

## 2021-06-27 ENCOUNTER — Ambulatory Visit
Admission: RE | Admit: 2021-06-27 | Discharge: 2021-06-27 | Disposition: A | Discharge status: Home / Self Care | Payer: Self-pay | Source: Ambulatory Visit | Attending: Medical | Admitting: Medical

## 2021-06-27 VITALS — BP 110/80 | HR 86 | Ht 72.0 in | Wt 219.0 lb

## 2021-06-27 DIAGNOSIS — I10 Essential (primary) hypertension: Secondary | ICD-10-CM

## 2021-06-27 DIAGNOSIS — R0683 Snoring: Secondary | ICD-10-CM

## 2021-06-27 DIAGNOSIS — E782 Mixed hyperlipidemia: Secondary | ICD-10-CM

## 2021-06-27 DIAGNOSIS — R0602 Shortness of breath: Secondary | ICD-10-CM

## 2021-06-27 DIAGNOSIS — I25118 Atherosclerotic heart disease of native coronary artery with other forms of angina pectoris: Secondary | ICD-10-CM

## 2021-06-27 DIAGNOSIS — Z720 Tobacco use: Secondary | ICD-10-CM

## 2021-06-27 DIAGNOSIS — E785 Hyperlipidemia, unspecified: Secondary | ICD-10-CM

## 2021-06-27 LAB — CBC WITH DIFFERENTIAL/PLATELET
Abs Immature Granulocytes: 0.03 10*3/uL (ref 0.00–0.07)
Basophils Absolute: 0.1 10*3/uL (ref 0.0–0.1)
Basophils Relative: 1 %
Eosinophils Absolute: 1 10*3/uL — ABNORMAL HIGH (ref 0.0–0.5)
Eosinophils Relative: 11 %
HCT: 42.3 % (ref 39.0–52.0)
Hemoglobin: 14.2 g/dL (ref 13.0–17.0)
Immature Granulocytes: 0 %
Lymphocytes Relative: 23 %
Lymphs Abs: 2 10*3/uL (ref 0.7–4.0)
MCH: 28.9 pg (ref 26.0–34.0)
MCHC: 33.6 g/dL (ref 30.0–36.0)
MCV: 86 fL (ref 80.0–100.0)
Monocytes Absolute: 0.6 10*3/uL (ref 0.1–1.0)
Monocytes Relative: 7 %
Neutro Abs: 5.2 10*3/uL (ref 1.7–7.7)
Neutrophils Relative %: 58 %
Platelets: 294 10*3/uL (ref 150–400)
RBC: 4.92 MIL/uL (ref 4.22–5.81)
RDW: 14.2 % (ref 11.5–15.5)
WBC: 8.9 10*3/uL (ref 4.0–10.5)
nRBC: 0 % (ref 0.0–0.2)

## 2021-06-27 LAB — BASIC METABOLIC PANEL
Anion gap: 8 (ref 5–15)
BUN: 17 mg/dL (ref 6–20)
CO2: 26 mmol/L (ref 22–32)
Calcium: 9.2 mg/dL (ref 8.9–10.3)
Chloride: 105 mmol/L (ref 98–111)
Creatinine, Ser: 1.2 mg/dL (ref 0.61–1.24)
GFR, Estimated: >60 mL/min (ref >60)
Glucose, Bld: 119 mg/dL — ABNORMAL HIGH (ref 70–99)
Potassium: 4.2 mmol/L (ref 3.5–5.1)
Sodium: 139 mmol/L (ref 135–145)

## 2021-06-27 LAB — BRAIN NATRIURETIC PEPTIDE: B Natriuretic Peptide: 18.9 pg/mL (ref 0.0–100.0)

## 2021-06-27 NOTE — Patient Instructions (Signed)
Medication Instructions:  ?Your physician recommends that you continue on your current medications as directed. Please refer to the Current Medication list given to you today. ? ?*If you need a refill on your cardiac medications before your next appointment, please call your pharmacy* ? ? ?Lab Work: ?Bmp, Bnp, Cbc today. ? ?Please have your labs drawn at the [facility]. Stop at the Registration desk to check in. ? ?If you have labs (blood work) drawn today and your tests are completely normal, you will receive your results only by: ?MyChart Message (if you have MyChart) OR ?A paper copy in the mail ?If you have any lab test that is abnormal or we need to change your treatment, we will call you to review the results. ? ? ?Testing/Procedures: ?Your physician has requested that you have an echocardiogram. Echocardiography is a painless test that uses sound waves to create images of your heart. It provides your doctor with information about the size and shape of your heart and how well your heart?s chambers and valves are working. This procedure takes approximately one hour. There are no restrictions for this procedure. ? ?A chest x-ray takes a picture of the organs and structures inside the chest, including the heart, lungs, and blood vessels. This test can show several things, including, whether the heart is enlarges; whether fluid is building up in the lungs; and whether pacemaker / defibrillator leads are still in place. ?(Today at the [facility]. Stop at the Registration desk to check in) ? ? ?Follow-Up: ?At [facility], you and your health needs are our priority.  As part of our continuing mission to provide you with exceptional heart care, we have created designated Provider Care Teams.  These Care Teams include your primary Cardiologist (physician) and Advanced Practice Providers (APPs -  Physician Assistants and Nurse Practitioners) who all work together to provide you with the care you need, when you  need it. ? ?We recommend signing up for the patient portal called "MyChart".  Sign up information is provided on this After Visit Summary.  MyChart is used to connect with patients for Virtual Visits (Telemedicine).  Patients are able to view lab/test results, encounter notes, upcoming appointments, etc.  Non-urgent messages can be sent to your provider as well.   ?To learn more about what you can do with MyChart, go to [email or web address].   ? ?Your next appointment:   ?3 week(s) ? ?The format for your next appointment:   ?In Person ? ?Provider:   ?You may see Debbe Odea, MD or one of the following Advanced Practice Providers on your designated Care Team:   ?Nicolasa Ducking, NP ?Eula Listen, PA-C ?Cadence Fransico Michael, PA-C{ ? ? ? ?Other Instructions ?N/A ? ?Important Information About Sugar ? ? ? ? ? ? ?

## 2021-06-27 NOTE — Progress Notes (Signed)
?Cardiology Office Note:   ? ?Date:  06/27/2021  ? ?ID:  Logan Crawford, DOB [birth year], MRN [id number] ? ?PCP:  Pcp, No  ?[facility] Cardiologist:  Kate Sable, MD  ?[place] Electrophysiologist:  None  ? ?Referring MD: No ref. provider found  ? ?Chief Complaint: 4 week follow-up ? ?History of Present Illness:   ? ?Logan Crawford is a 46 y.o. male with a hx of CAD s/p LAD stenting in 11/2019, HTN, HLD, medication nonadherence, cocaine use and smoking who was admitted with an NSTEMI.  ?  ? He presented in 11/2019 w/ chest pain and ruled in for NSTEMI.  Echo showed and EF of 45-50% w/ moderate mid-apical anteroseptal HK.  Cath revealed severe mid LAD dzs, which was successfully treated w/ a DES.  He had moderate residual ostial/prox RCA dzs, which was medically managed.  At f/u in 04/2020, he reported ongoing intermittent c/p.  Stress testing was ordered, however, pt deferred scheduling until 11/2020, at which time lexiscan myoview was performed and low-risk w/ a small area of apical ischemia, which was present on attenuation correction imaging, and felt to be 2/2 over processing. ?  ?The patient was admitted 04/2021 for NSTEMI. Reported noncompliance with medications and admitted cocaine use a few days prior to admission. He underwent cardiac cath and was treated with successful complex IVUS-guided bifurcation PCI/DES to the mid LAD as outlined in the report with a 2nd stent being placed distally due to suspected distal edge dissection and PTCA of the ostial D2 prior to stent placement of the LAD. He was started on DAPT with ASA and Brilinta for 12 months.  ? ?He was seen 05/30/2021 and was overall doing well. Imdur was increased for chest pain. He was referred to pulmonology for a sleep study.  ? ?Today, the patient reports SOB. It is worse with exertion. Feels it has been acutely worse. On exam he is obviously short of breath. No orthopnea or pnd. No LLE. NO chest pain. Also has allergies. He reports  swelling in his left hand, it feels tight and hard to close. He denies any new foods or medications. Also has some bilateral knee pain. He is still smoking cigarettes. He was late to the pulmonology appointment and this was re-scheduled.   ? ?Past Medical History:  ?Diagnosis Date  ? CAD (coronary artery disease)   ? a. 11/2019 NSTEMI/PCI: LM min irregs, LAD 26m (2.75x22 Resolute Onyx DES), 40d, D2 min irregs, LCX/LPAV min irregs, RCA 60ost, 40p/m, EF 45-50%; b. 11/2020 MV: EF 43%, small area of apical ischemia ->? over processing-->low risk.  ? Hyperlipidemia   ? Hypertension   ? Ischemic cardiomyopathy   ? a. 11/2019 LV Gram: EF 45-50%; b. 12/2019 Echo: EF 50% w/ mod mid-apical anteroseptal HK, GrI DD, nl RV fxn, mild Ao sclerosis.  ? Tobacco use 11/25/2019  ? ? ?Past Surgical History:  ?Procedure Laterality Date  ? CORONARY STENT INTERVENTION N/A 01/24/2020  ? Procedure: CORONARY STENT INTERVENTION;  Surgeon: Wellington Hampshire, MD;  Location: [place] [facility];  Service: Cardiovascular;  Laterality: N/A;  ? CORONARY STENT INTERVENTION N/A 05/20/2021  ? Procedure: CORONARY STENT INTERVENTION;  Surgeon: Wellington Hampshire, MD;  Location: [place] [facility];  Service: Cardiovascular;  Laterality: N/A;  ? INTRAVASCULAR ULTRASOUND/IVUS N/A 05/20/2021  ? Procedure: Intravascular Ultrasound/IVUS;  Surgeon: Wellington Hampshire, MD;  Location: [place] [facility];  Service: Cardiovascular;  Laterality: N/A;  ? IRRIGATION AND DEBRIDEMENT KNEE Left   ?  LEFT HEART CATH AND CORONARY ANGIOGRAPHY N/A 01/24/2020  ? Procedure: LEFT HEART CATH AND CORONARY ANGIOGRAPHY;  Surgeon: Wellington Hampshire, MD;  Location: [place] [facility];  Service: Cardiovascular;  Laterality: N/A;  ? LEFT HEART CATH AND CORS/GRAFTS ANGIOGRAPHY N/A 05/20/2021  ? Procedure: LEFT HEART CATH AND CORS/GRAFTS ANGIOGRAPHY;  Surgeon: Wellington Hampshire, MD;  Location: [place] [facility];  Service: Cardiovascular;  Laterality: N/A;  ? TYMPANOSTOMY TUBE  PLACEMENT Bilateral   ? ? ?Current Medications: ?Current Meds  ?Medication Sig  ? albuterol (VENTOLIN HFA) 108 (90 Base) MCG/ACT inhaler Inhale 2 puffs into the lungs every 6 (six) hours as needed for wheezing or shortness of breath.  ? amLODipine (NORVASC) 10 MG tablet Take 1 tablet (10 mg total) by mouth daily.  ? aspirin EC 81 MG EC tablet Take 1 tablet (81 mg total) by mouth daily. Swallow whole.  ? atorvastatin (LIPITOR) 80 MG tablet Take 1 tablet (80 mg total) by mouth daily.  ? carvedilol (COREG) 6.25 MG tablet Take 1 tablet (6.25 mg total) by mouth 2 (two) times daily with a meal.  ? isosorbide mononitrate (IMDUR) 60 MG 24 hr tablet Take 1.5 tablets (90 mg total) by mouth daily.  ? Multiple Vitamin (MULTIVITAMIN) tablet Take 1 tablet by mouth daily.  ? Niacin (VITAMIN B-3 PO) Take by mouth daily.  ? nitroGLYCERIN (NITROSTAT) 0.4 MG SL tablet Place 1 tablet (0.4 mg total) under the tongue every 5 (five) minutes x 3 doses as needed for chest pain.  ? pantoprazole (PROTONIX) 40 MG tablet Take 1 tablet (40 mg total) by mouth daily.  ? ticagrelor (BRILINTA) 90 MG TABS tablet Take 1 tablet (90 mg total) by mouth every 12 (twelve) hours.  ?  ? ?Allergies:   Patient has no known allergies.  ? ?Social History  ? ?Socioeconomic History  ? Marital status: Single  ?  Spouse name: Not on file  ? Number of children: Not on file  ? Years of education: Not on file  ? Highest education level: Not on file  ?Occupational History  ? Not on file  ?Tobacco Use  ? Smoking status: Former  ?  Packs/day: 1.00  ?  Years: 22.00  ?  Pack years: 22.00  ?  Types: Cigarettes  ?  Quit date: 01/23/2020  ?  Years since quitting: 1.4  ? Smokeless tobacco: Never  ? Tobacco comments:  ?  Smokes 1-2 cigarettes daily    ?Vaping Use  ? Vaping Use: Never used  ?Substance and Sexual Activity  ? Alcohol use: Yes  ?  Alcohol/week: 1.0 standard drink  ?  Types: 1 Shots of liquor per week  ?  Comment: socially  ? Drug use: No  ? Sexual activity: Not on  file  ?Other Topics Concern  ? Not on file  ?Social History Narrative  ? Not on file  ? ?Social Determinants of Health  ? ?Financial Resource Strain: Not on file  ?Food Insecurity: Not on file  ?Transportation Needs: Not on file  ?Physical Activity: Not on file  ?Stress: Not on file  ?Social Connections: Not on file  ?  ? ?Family History: ?The patient's family history includes Arrhythmia in his mother; Coronary artery disease in his father and mother; Heart failure in his mother. ? ?ROS:   ?Please see the history of present illness.    ? All other systems reviewed and are negative. ? ?EKGs/Labs/Other Studies Reviewed:   ? ?The following studies were reviewed today: ?  2D echo 05/18/2021: ?1. Left ventricular ejection fraction, by estimation, is 50 to 55%. The  ?left ventricle has low normal function. The left ventricle demonstrates  ?regional wall motion abnormalities (hypokinesis of the mid to distal  ?anteroseptal/septal, apical region There  ? is moderate left ventricular hypertrophy. Left ventricular diastolic  ?parameters are consistent with Grade I diastolic dysfunction (impaired  ?relaxation).  ? 2. Right ventricular systolic function is normal. The right ventricular  ?size is normal. Tricuspid regurgitation signal is inadequate for assessing  ?PA pressure.  ? 3. The mitral valve is normal in structure. Mild mitral valve  ?regurgitation. No evidence of mitral stenosis.  ? 4. The aortic valve was not well visualized. Aortic valve regurgitation  ?is not visualized. Aortic valve sclerosis is present, with no evidence of  ?aortic valve stenosis.  ? 5. The inferior vena cava is normal in size with greater than 50%  ?respiratory variability, suggesting right atrial pressure of 3 mmHg. ?__________ ?  ?LHC 05/20/2021: ?  Dist LAD lesion is 40% stenosed. ?  Prox RCA to Mid RCA lesion is 40% stenosed. ?  Ost RCA to Prox RCA lesion is 50% stenosed. ?  RPDA lesion is 40% stenosed. ?  Ost LM lesion is 30% stenosed. ?  Mid LAD  lesion is 85% stenosed. ?  2nd Diag lesion is 60% stenosed. ?  A drug-eluting stent was successfully placed using a [place] 2.75X34. ?  Balloon angioplasty was performed using a BALLN TREK RX 2

## 2021-07-05 ENCOUNTER — Other Ambulatory Visit: Payer: Self-pay

## 2021-07-16 ENCOUNTER — Ambulatory Visit (INDEPENDENT_AMBULATORY_CARE_PROVIDER_SITE_OTHER): Payer: Self-pay

## 2021-07-16 DIAGNOSIS — R0602 Shortness of breath: Secondary | ICD-10-CM

## 2021-07-16 LAB — ECHOCARDIOGRAM LIMITED
Area-P 1/2: 3.68 cm2
Calc EF: 56.1 %
S' Lateral: 2.6 cm
Single Plane A2C EF: 56.2 %
Single Plane A4C EF: 54.6 %

## 2021-07-17 ENCOUNTER — Telehealth: Payer: Self-pay

## 2021-07-17 NOTE — Telephone Encounter (Signed)
-----   Message from Cadence David Stall, PA-C sent at 07/17/2021  1:12 PM EDT ----- Echo showed normal pump function, impaired relaxation. No significant changes from prior.

## 2021-07-17 NOTE — Telephone Encounter (Signed)
Called to give the patient echo results. Unable to lmom. Pt voicemail is not set up. 

## 2021-07-19 ENCOUNTER — Ambulatory Visit: Payer: Self-pay | Admitting: Medical

## 2021-07-19 NOTE — Progress Notes (Unsigned)
Cardiology Office Note:    Date:  07/19/2021   ID:  ZAHMIR STELLHORN, DOB 1975/04/11, MRN [id number]  PCP:  Kathyrn Lass  [facility] Cardiologist:  Kate Sable, MD  [place] Electrophysiologist:  None   Referring MD: No ref. provider found   Chief Complaint: f/u imaging  History of Present Illness:    Logan Crawford is a 46 y.o. male with a hx of  CAD s/p LAD stenting in 11/2019, HTN, HLD, medication nonadherence, cocaine use and smoking who was admitted with an NSTEMI.     He presented in 11/2019 w/ chest pain and ruled in for NSTEMI.  Echo showed and EF of 45-50% w/ moderate mid-apical anteroseptal HK.  Cath revealed severe mid LAD dzs, which was successfully treated w/ a DES.  He had moderate residual ostial/prox RCA dzs, which was medically managed.  At f/u in 04/2020, he reported ongoing intermittent c/p.  Stress testing was ordered, however, pt deferred scheduling until 11/2020, at which time lexiscan myoview was performed and low-risk w/ a small area of apical ischemia, which was present on attenuation correction imaging, and felt to be 2/2 over processing.   The patient was admitted 04/2021 for NSTEMI. Reported noncompliance with medications and admitted cocaine use a few days prior to admission. He underwent cardiac cath and was treated with successful complex IVUS-guided bifurcation PCI/DES to the mid LAD as outlined in the report with a 2nd stent being placed distally due to suspected distal edge dissection and PTCA of the ostial D2 prior to stent placement of the LAD. He was started on DAPT with ASA and Brilinta for 12 months.    He was seen 05/30/2021 and was overall doing well. Imdur was increased for chest pain. He was referred to pulmonology for a sleep study.   Last seen 06/27/21 and reported worsening shortness of breath. May need to switch Brilinta. Labs were ordered and a limited echo and CXR.   Today,      Past Medical History:  Diagnosis Date   CAD  (coronary artery disease)    a. 11/2019 NSTEMI/PCI: LM min irregs, LAD 32m (2.75x22 Resolute Onyx DES), 40d, D2 min irregs, LCX/LPAV min irregs, RCA 60ost, 40p/m, EF 45-50%; b. 11/2020 MV: EF 43%, small area of apical ischemia ->? over processing-->low risk.   Hyperlipidemia    Hypertension    Ischemic cardiomyopathy    a. 11/2019 LV Gram: EF 45-50%; b. 12/2019 Echo: EF 50% w/ mod mid-apical anteroseptal HK, GrI DD, nl RV fxn, mild Ao sclerosis.   Tobacco use 11/25/2019    Past Surgical History:  Procedure Laterality Date   CORONARY STENT INTERVENTION N/A 01/24/2020   Procedure: CORONARY STENT INTERVENTION;  Surgeon: Wellington Hampshire, MD;  Location: [place] [facility];  Service: Cardiovascular;  Laterality: N/A;   CORONARY STENT INTERVENTION N/A 05/20/2021   Procedure: CORONARY STENT INTERVENTION;  Surgeon: Wellington Hampshire, MD;  Location: [place] [facility];  Service: Cardiovascular;  Laterality: N/A;   INTRAVASCULAR ULTRASOUND/IVUS N/A 05/20/2021   Procedure: Intravascular Ultrasound/IVUS;  Surgeon: Wellington Hampshire, MD;  Location: [place] [facility];  Service: Cardiovascular;  Laterality: N/A;   IRRIGATION AND DEBRIDEMENT KNEE Left    LEFT HEART CATH AND CORONARY ANGIOGRAPHY N/A 01/24/2020   Procedure: LEFT HEART CATH AND CORONARY ANGIOGRAPHY;  Surgeon: Wellington Hampshire, MD;  Location: [place] [facility];  Service: Cardiovascular;  Laterality: N/A;   LEFT HEART CATH AND CORS/GRAFTS ANGIOGRAPHY N/A 05/20/2021   Procedure: LEFT  HEART CATH AND CORS/GRAFTS ANGIOGRAPHY;  Surgeon: Wellington Hampshire, MD;  Location: [place] [facility];  Service: Cardiovascular;  Laterality: N/A;   TYMPANOSTOMY TUBE PLACEMENT Bilateral     Current Medications: No outpatient medications have been marked as taking for the 07/19/21 encounter (Appointment) with Logan Crawford, Logan Common H, PA-C.     Allergies:   Patient has no known allergies.   Social History   Socioeconomic History   Marital status: Single     Spouse name: Not on file   Number of children: Not on file   Years of education: Not on file   Highest education level: Not on file  Occupational History   Not on file  Tobacco Use   Smoking status: Former    Packs/day: 1.00    Years: 22.00    Pack years: 22.00    Types: Cigarettes    Quit date: 01/23/2020    Years since quitting: 1.4   Smokeless tobacco: Never   Tobacco comments:    Smokes 1-2 cigarettes daily    Vaping Use   Vaping Use: Never used  Substance and Sexual Activity   Alcohol use: Yes    Alcohol/week: 1.0 standard drink    Types: 1 Shots of liquor per week    Comment: socially   Drug use: No   Sexual activity: Not on file  Other Topics Concern   Not on file  Social History Narrative   Not on file   Social Determinants of Health   Financial Resource Strain: Not on file  Food Insecurity: Not on file  Transportation Needs: Not on file  Physical Activity: Not on file  Stress: Not on file  Social Connections: Not on file     Family History: The patient's family history includes Arrhythmia in his mother; Coronary artery disease in his father and mother; Heart failure in his mother.  ROS:   Please see the history of present illness.     All other systems reviewed and are negative.  EKGs/Labs/Other Studies Reviewed:    The following studies were reviewed today:  2D echo 05/18/2021: 1. Left ventricular ejection fraction, by estimation, is 50 to 55%. The  left ventricle has low normal function. The left ventricle demonstrates  regional wall motion abnormalities (hypokinesis of the mid to distal  anteroseptal/septal, apical region There   is moderate left ventricular hypertrophy. Left ventricular diastolic  parameters are consistent with Grade I diastolic dysfunction (impaired  relaxation).   2. Right ventricular systolic function is normal. The right ventricular  size is normal. Tricuspid regurgitation signal is inadequate for assessing  PA  pressure.   3. The mitral valve is normal in structure. Mild mitral valve  regurgitation. No evidence of mitral stenosis.   4. The aortic valve was not well visualized. Aortic valve regurgitation  is not visualized. Aortic valve sclerosis is present, with no evidence of  aortic valve stenosis.   5. The inferior vena cava is normal in size with greater than 50%  respiratory variability, suggesting right atrial pressure of 3 mmHg. __________   LHC 05/20/2021:   Dist LAD lesion is 40% stenosed.   Prox RCA to Mid RCA lesion is 40% stenosed.   Ost RCA to Prox RCA lesion is 50% stenosed.   RPDA lesion is 40% stenosed.   Ost LM lesion is 30% stenosed.   Mid LAD lesion is 85% stenosed.   2nd Diag lesion is 60% stenosed.   A drug-eluting stent was successfully placed using a STENT  ONYX FRONTIER [id number].   Balloon angioplasty was performed using a BALLN TREK RX [id number].   Post intervention, there is a 0% residual stenosis.   Post intervention, there is a 30% residual stenosis.   LV end diastolic pressure is moderately elevated.   The left ventricular ejection fraction is 50-55% by visual estimate.   1.  Severe one-vessel coronary artery disease due to distal edge restenosis of the previously placed stent in the left anterior descending artery.  In addition, there is extensive plaque distal to the stent involving the whole mid segment.  Stable moderate right coronary artery disease. 2.  Low normal LV systolic function with an EF of 50 to 55% with mild anterior apical hypokinesis.  Moderately elevated left ventricular end-diastolic pressure. 3.  Successful complex IVUS guided bifurcation angioplasty and drug-eluting stent placement to the mid LAD.  IVUS before PCI showed stent underexpansion distally as well as extensive plaque distal to the stent.  No stent fractures.  Another stent was added distally due to suspected distal edge dissection.  Balloon angioplasty was performed and the ostial second  diagonal before placing the stent.    Recommendations: Dual antiplatelet therapy for at least 1 year. Aggressive treatment of risk factors. I discussed with the patient the importance of compliance with medications. AVOID catheterization via the right radial artery in the future due to significant radial artery spasm.  Only a 5 [place] guide could be advanced.  EKG:  EKG is *** ordered today.  The ekg ordered today demonstrates ***  Recent Labs: 05/18/2021: ALT 18 06/27/2021: B Natriuretic Peptide 18.9; BUN 17; Creatinine, Ser 1.20; Hemoglobin 14.2; Platelets 294; Potassium 4.2; Sodium 139  Recent Lipid Panel    Component Value Date/Time   CHOL 138 05/19/2021 0223   TRIG 268 (Crawford) 05/19/2021 0223   HDL 27 (L) 05/19/2021 0223   CHOLHDL 5.1 05/19/2021 0223   VLDL 54 (Crawford) 05/19/2021 0223   LDLCALC 57 05/19/2021 0223     Risk Assessment/Calculations:   {Does this patient have ATRIAL FIBRILLATION?[phone]}   Physical Exam:    VS:  There were no vitals taken for this visit.    Wt Readings from Last 3 Encounters:  06/27/21 219 lb (99.3 kg)  05/30/21 214 lb 9.6 oz (97.3 kg)  05/20/21 216 lb 11.4 oz (98.3 kg)     GEN: *** Well nourished, well developed in no acute distress HEENT: Normal NECK: No JVD; No carotid bruits LYMPHATICS: No lymphadenopathy CARDIAC: ***RRR, no murmurs, rubs, gallops RESPIRATORY:  Clear to auscultation without rales, wheezing or rhonchi  ABDOMEN: Soft, non-tender, non-distended MUSCULOSKELETAL:  No edema; No deformity  SKIN: Warm and dry NEUROLOGIC:  Alert and oriented x 3 PSYCHIATRIC:  Normal affect   ASSESSMENT:    No diagnosis found. PLAN:    In order of problems listed above:  SOB  CAD s/p DESx2 mLAD and balloon angioplasty  HTN  HLD  Tobacco use  Sleep disorder  Crawford/o cocaine use  Disposition: Follow up {follow up:15908} with ***   Shared Decision Making/Informed Consent   {Are you ordering a CV Procedure (e.g. stress test,  cath, DCCV, TEE, etc)?   Press F2        :[id number]    Signed, Camie Hauss Ninfa Meeker, PA-C  07/19/2021 12:21 PM    [facility]

## 2021-07-23 NOTE — Telephone Encounter (Signed)
2nd attempt to contact the patient with results. Unable to lmom. Pt voicemail is not set up. 

## 2021-08-23 ENCOUNTER — Ambulatory Visit: Payer: Self-pay | Admitting: Primary Care

## 2021-11-06 ENCOUNTER — Encounter: Payer: Self-pay | Admitting: *Deleted

## 2021-11-06 DIAGNOSIS — Z006 Encounter for examination for normal comparison and control in clinical research program: Secondary | ICD-10-CM

## 2021-11-06 NOTE — Research (Signed)
Spoke with Logan Crawford. He states he would like more information about [employer] research study. Mailed consent for him to review. Encouraged him to call with any questions.

## 2021-11-16 ENCOUNTER — Emergency Department: Payer: Self-pay

## 2021-11-16 ENCOUNTER — Observation Stay (HOSPITAL_BASED_OUTPATIENT_CLINIC_OR_DEPARTMENT_OTHER)
Admit: 2021-11-16 | Discharge: 2021-11-16 | Disposition: A | Discharge status: Home / Self Care | Payer: Self-pay | Attending: Cardiovascular Disease | Admitting: Cardiovascular Disease

## 2021-11-16 ENCOUNTER — Other Ambulatory Visit: Payer: Self-pay

## 2021-11-16 ENCOUNTER — Inpatient Hospital Stay
Admission: EM | Admit: 2021-11-16 | Discharge: 2021-11-19 | DRG: 281 | Disposition: A | Discharge status: Home / Self Care | Payer: Self-pay | Attending: Osteopathic Medicine | Admitting: Osteopathic Medicine

## 2021-11-16 DIAGNOSIS — Z7982 Long term (current) use of aspirin: Secondary | ICD-10-CM

## 2021-11-16 DIAGNOSIS — Z955 Presence of coronary angioplasty implant and graft: Secondary | ICD-10-CM

## 2021-11-16 DIAGNOSIS — Z8249 Family history of ischemic heart disease and other diseases of the circulatory system: Secondary | ICD-10-CM

## 2021-11-16 DIAGNOSIS — E7849 Other hyperlipidemia: Secondary | ICD-10-CM | POA: Diagnosis present

## 2021-11-16 DIAGNOSIS — Z7902 Long term (current) use of antithrombotics/antiplatelets: Secondary | ICD-10-CM

## 2021-11-16 DIAGNOSIS — Z20822 Contact with and (suspected) exposure to covid-19: Secondary | ICD-10-CM | POA: Diagnosis present

## 2021-11-16 DIAGNOSIS — Z6829 Body mass index (BMI) 29.0-29.9, adult: Secondary | ICD-10-CM

## 2021-11-16 DIAGNOSIS — F1410 Cocaine abuse, uncomplicated: Secondary | ICD-10-CM | POA: Diagnosis present

## 2021-11-16 DIAGNOSIS — I25118 Atherosclerotic heart disease of native coronary artery with other forms of angina pectoris: Secondary | ICD-10-CM

## 2021-11-16 DIAGNOSIS — Z79899 Other long term (current) drug therapy: Secondary | ICD-10-CM

## 2021-11-16 DIAGNOSIS — I2510 Atherosclerotic heart disease of native coronary artery without angina pectoris: Secondary | ICD-10-CM | POA: Diagnosis present

## 2021-11-16 DIAGNOSIS — Z91148 Patient's other noncompliance with medication regimen for other reason: Secondary | ICD-10-CM

## 2021-11-16 DIAGNOSIS — Y831 Surgical operation with implant of artificial internal device as the cause of abnormal reaction of the patient, or of later complication, without mention of misadventure at the time of the procedure: Secondary | ICD-10-CM | POA: Diagnosis present

## 2021-11-16 DIAGNOSIS — Z720 Tobacco use: Secondary | ICD-10-CM | POA: Diagnosis present

## 2021-11-16 DIAGNOSIS — E663 Overweight: Secondary | ICD-10-CM | POA: Diagnosis present

## 2021-11-16 DIAGNOSIS — I200 Unstable angina: Secondary | ICD-10-CM

## 2021-11-16 DIAGNOSIS — T82855A Stenosis of coronary artery stent, initial encounter: Secondary | ICD-10-CM | POA: Diagnosis present

## 2021-11-16 DIAGNOSIS — Z91199 Patient's noncompliance with other medical treatment and regimen due to unspecified reason: Secondary | ICD-10-CM

## 2021-11-16 DIAGNOSIS — R079 Chest pain, unspecified: Secondary | ICD-10-CM

## 2021-11-16 DIAGNOSIS — Z87891 Personal history of nicotine dependence: Secondary | ICD-10-CM | POA: Diagnosis present

## 2021-11-16 DIAGNOSIS — I25110 Atherosclerotic heart disease of native coronary artery with unstable angina pectoris: Secondary | ICD-10-CM | POA: Diagnosis present

## 2021-11-16 DIAGNOSIS — F1910 Other psychoactive substance abuse, uncomplicated: Secondary | ICD-10-CM | POA: Diagnosis present

## 2021-11-16 DIAGNOSIS — I214 Non-ST elevation (NSTEMI) myocardial infarction: Principal | ICD-10-CM | POA: Diagnosis present

## 2021-11-16 DIAGNOSIS — F17210 Nicotine dependence, cigarettes, uncomplicated: Secondary | ICD-10-CM | POA: Diagnosis present

## 2021-11-16 DIAGNOSIS — Z951 Presence of aortocoronary bypass graft: Secondary | ICD-10-CM

## 2021-11-16 DIAGNOSIS — I255 Ischemic cardiomyopathy: Secondary | ICD-10-CM | POA: Diagnosis present

## 2021-11-16 DIAGNOSIS — I10 Essential (primary) hypertension: Secondary | ICD-10-CM | POA: Diagnosis present

## 2021-11-16 DIAGNOSIS — E781 Pure hyperglyceridemia: Secondary | ICD-10-CM | POA: Diagnosis present

## 2021-11-16 DIAGNOSIS — Z6827 Body mass index (BMI) 27.0-27.9, adult: Secondary | ICD-10-CM

## 2021-11-16 DIAGNOSIS — F1210 Cannabis abuse, uncomplicated: Secondary | ICD-10-CM | POA: Diagnosis present

## 2021-11-16 DIAGNOSIS — E785 Hyperlipidemia, unspecified: Secondary | ICD-10-CM | POA: Diagnosis present

## 2021-11-16 DIAGNOSIS — F1110 Opioid abuse, uncomplicated: Secondary | ICD-10-CM | POA: Diagnosis present

## 2021-11-16 LAB — PROTIME-INR
INR: 1 (ref 0.8–1.2)
Prothrombin Time: 13 seconds (ref 11.4–15.2)

## 2021-11-16 LAB — CBC WITH DIFFERENTIAL/PLATELET
Abs Immature Granulocytes: 0.01 10*3/uL (ref 0.00–0.07)
Basophils Absolute: 0 10*3/uL (ref 0.0–0.1)
Basophils Relative: 0 %
Eosinophils Absolute: 0.6 10*3/uL — ABNORMAL HIGH (ref 0.0–0.5)
Eosinophils Relative: 7 %
HCT: 44.3 % (ref 39.0–52.0)
Hemoglobin: 15.2 g/dL (ref 13.0–17.0)
Immature Granulocytes: 0 %
Lymphocytes Relative: 26 %
Lymphs Abs: 2.1 10*3/uL (ref 0.7–4.0)
MCH: 29.5 pg (ref 26.0–34.0)
MCHC: 34.3 g/dL (ref 30.0–36.0)
MCV: 86 fL (ref 80.0–100.0)
Monocytes Absolute: 0.5 10*3/uL (ref 0.1–1.0)
Monocytes Relative: 6 %
Neutro Abs: 5 10*3/uL (ref 1.7–7.7)
Neutrophils Relative %: 61 %
Platelets: 319 10*3/uL (ref 150–400)
RBC: 5.15 MIL/uL (ref 4.22–5.81)
RDW: 13.7 % (ref 11.5–15.5)
WBC: 8.2 10*3/uL (ref 4.0–10.5)
nRBC: 0 % (ref 0.0–0.2)

## 2021-11-16 LAB — COMPREHENSIVE METABOLIC PANEL
ALT: 20 U/L (ref 0–44)
AST: 29 U/L (ref 15–41)
Albumin: 3.5 g/dL (ref 3.5–5.0)
Alkaline Phosphatase: 79 U/L (ref 38–126)
Anion gap: 8 (ref 5–15)
BUN: 17 mg/dL (ref 6–20)
CO2: 22 mmol/L (ref 22–32)
Calcium: 9.2 mg/dL (ref 8.9–10.3)
Chloride: 110 mmol/L (ref 98–111)
Creatinine, Ser: 1.18 mg/dL (ref 0.61–1.24)
GFR, Estimated: >60 mL/min (ref >60)
Glucose, Bld: 114 mg/dL — ABNORMAL HIGH (ref 70–99)
Potassium: 3.9 mmol/L (ref 3.5–5.1)
Sodium: 140 mmol/L (ref 135–145)
Total Bilirubin: 0.6 mg/dL (ref 0.3–1.2)
Total Protein: 7 g/dL (ref 6.5–8.1)

## 2021-11-16 LAB — URINE DRUG SCREEN, QUALITATIVE (ARMC ONLY)
Amphetamines, Ur Screen: NOT DETECTED
Barbiturates, Ur Screen: NOT DETECTED
Benzodiazepine, Ur Scrn: NOT DETECTED
Cannabinoid 50 Ng, Ur ~~LOC~~: POSITIVE — AB
Cocaine Metabolite,Ur ~~LOC~~: POSITIVE — AB
MDMA (Ecstasy)Ur Screen: NOT DETECTED
Methadone Scn, Ur: NOT DETECTED
Opiate, Ur Screen: POSITIVE — AB
Phencyclidine (PCP) Ur S: NOT DETECTED
Tricyclic, Ur Screen: NOT DETECTED

## 2021-11-16 LAB — ECHOCARDIOGRAM COMPLETE
AR max vel: 2.39 cm2
AV Peak grad: 9.4 mmHg
Ao pk vel: 1.54 m/s
Area-P 1/2: 4.16 cm2
Calc EF: 58.8 %
Height: 73 in
S' Lateral: 2.5 cm
Single Plane A2C EF: 57.1 %
Single Plane A4C EF: 62.3 %
Weight: 3568 oz

## 2021-11-16 LAB — TROPONIN I (HIGH SENSITIVITY)
Troponin I (High Sensitivity): 21 ng/L — ABNORMAL HIGH (ref <18)
Troponin I (High Sensitivity): 29 ng/L — ABNORMAL HIGH (ref <18)

## 2021-11-16 LAB — HEPARIN LEVEL (UNFRACTIONATED): Heparin Unfractionated: 0.44 IU/mL (ref 0.30–0.70)

## 2021-11-16 LAB — APTT: aPTT: 28 seconds (ref 24–36)

## 2021-11-16 LAB — SARS CORONAVIRUS 2 BY RT PCR: SARS Coronavirus 2 by RT PCR: NEGATIVE

## 2021-11-16 MED ORDER — TICAGRELOR 90 MG PO TABS
90.0000 mg | ORAL_TABLET | Freq: Two times a day (BID) | ORAL | Status: DC
  Administered 2021-11-16 – 2021-11-19 (×5): 90 mg via ORAL
  Filled 2021-11-16 (×5): qty 1

## 2021-11-16 MED ORDER — ONDANSETRON HCL 4 MG/2ML IJ SOLN
4.0000 mg | Freq: Once | INTRAMUSCULAR | Status: AC
  Administered 2021-11-16: 4 mg via INTRAVENOUS
  Filled 2021-11-16: qty 2

## 2021-11-16 MED ORDER — OXYCODONE-ACETAMINOPHEN 5-325 MG PO TABS
1.0000 | ORAL_TABLET | Freq: Once | ORAL | Status: DC
  Filled 2021-11-16: qty 1

## 2021-11-16 MED ORDER — PANTOPRAZOLE SODIUM 40 MG PO TBEC
40.0000 mg | DELAYED_RELEASE_TABLET | Freq: Every day | ORAL | Status: DC
  Administered 2021-11-16 – 2021-11-19 (×4): 40 mg via ORAL
  Filled 2021-11-16 (×4): qty 1

## 2021-11-16 MED ORDER — ISOSORBIDE MONONITRATE ER 60 MG PO TB24
90.0000 mg | ORAL_TABLET | Freq: Every day | ORAL | Status: DC
  Administered 2021-11-16 – 2021-11-19 (×4): 90 mg via ORAL
  Filled 2021-11-16: qty 2
  Filled 2021-11-16: qty 1
  Filled 2021-11-16 (×2): qty 2

## 2021-11-16 MED ORDER — ASPIRIN 81 MG PO TBEC
81.0000 mg | DELAYED_RELEASE_TABLET | Freq: Every day | ORAL | Status: DC
  Administered 2021-11-17: 81 mg via ORAL
  Filled 2021-11-16: qty 1

## 2021-11-16 MED ORDER — ROSUVASTATIN CALCIUM 20 MG PO TABS
20.0000 mg | ORAL_TABLET | Freq: Every day | ORAL | Status: DC
  Administered 2021-11-16: 20 mg via ORAL
  Filled 2021-11-16: qty 1

## 2021-11-16 MED ORDER — HEPARIN (PORCINE) 25000 UT/250ML-% IV SOLN
1600.0000 [IU]/h | INTRAVENOUS | Status: DC
  Administered 2021-11-16 – 2021-11-17 (×3): 1600 [IU]/h via INTRAVENOUS
  Filled 2021-11-16 (×3): qty 250

## 2021-11-16 MED ORDER — MORPHINE SULFATE (PF) 4 MG/ML IV SOLN
4.0000 mg | INTRAVENOUS | Status: DC | PRN
  Administered 2021-11-16 (×2): 4 mg via INTRAVENOUS
  Filled 2021-11-16 (×2): qty 1

## 2021-11-16 MED ORDER — CARVEDILOL 6.25 MG PO TABS
6.2500 mg | ORAL_TABLET | Freq: Two times a day (BID) | ORAL | Status: DC
  Administered 2021-11-16 – 2021-11-19 (×5): 6.25 mg via ORAL
  Filled 2021-11-16 (×5): qty 1

## 2021-11-16 MED ORDER — NITROGLYCERIN 0.4 MG SL SUBL
0.4000 mg | SUBLINGUAL_TABLET | SUBLINGUAL | Status: DC | PRN
  Administered 2021-11-16 – 2021-11-17 (×2): 0.4 mg via SUBLINGUAL
  Filled 2021-11-16 (×2): qty 1

## 2021-11-16 MED ORDER — HEPARIN BOLUS VIA INFUSION
4000.0000 [IU] | Freq: Once | INTRAVENOUS | Status: AC
  Administered 2021-11-16: 4000 [IU] via INTRAVENOUS
  Filled 2021-11-16: qty 4000

## 2021-11-16 MED ORDER — ONDANSETRON HCL 4 MG/2ML IJ SOLN
4.0000 mg | Freq: Four times a day (QID) | INTRAMUSCULAR | Status: DC | PRN
  Administered 2021-11-17: 4 mg via INTRAVENOUS
  Filled 2021-11-16: qty 2

## 2021-11-16 MED ORDER — ACETAMINOPHEN 325 MG PO TABS: 650.0000 mg | ORAL_TABLET | ORAL | Status: DC | PRN

## 2021-11-16 MED ORDER — ATORVASTATIN CALCIUM 80 MG PO TABS
80.0000 mg | ORAL_TABLET | Freq: Every day | ORAL | Status: DC
  Administered 2021-11-17 – 2021-11-19 (×3): 80 mg via ORAL
  Filled 2021-11-16: qty 4
  Filled 2021-11-16: qty 1
  Filled 2021-11-16: qty 4

## 2021-11-16 MED ORDER — AMLODIPINE BESYLATE 10 MG PO TABS
10.0000 mg | ORAL_TABLET | Freq: Every day | ORAL | Status: DC
  Administered 2021-11-16 – 2021-11-19 (×4): 10 mg via ORAL
  Filled 2021-11-16: qty 2
  Filled 2021-11-16: qty 1
  Filled 2021-11-16 (×2): qty 2

## 2021-11-16 MED ORDER — ALBUTEROL SULFATE (2.5 MG/3ML) 0.083% IN NEBU
3.0000 mL | INHALATION_SOLUTION | Freq: Four times a day (QID) | RESPIRATORY_TRACT | Status: DC | PRN
  Administered 2021-11-16 – 2021-11-18 (×2): 3 mL via RESPIRATORY_TRACT
  Filled 2021-11-16 (×2): qty 3

## 2021-11-16 NOTE — ED Triage Notes (Signed)
Pt reports central chest pain intermittent last night, constant today starting at 7am. Describes as sharp. Improved some with the SL nitro spray given by EMS.   Pt reports taking 5 baby aspirin at home

## 2021-11-16 NOTE — H&P (Signed)
History and Physical    Patient: Logan Crawford NOM:[id number] DOB: 11/11/1975 DOA: 11/16/2021 DOS: the patient was seen and examined on 11/16/2021 PCP: Pcp, No  Patient coming from: Home  Chief Complaint:  Chief Complaint  Patient presents with   Chest Pain   HPI: Logan Crawford is a 46 y.o. male with PMH of CAD s/p angioplasty and DES to mid LAD on 05/20/2021, HTN, tobacco use disorder and noncompliance with medication presenting with chest pain.  Patient reports brief intermittent chest pain for about a month.  Woke up this morning about 7 AM with significant anterior chest pain.  Describes the pain as sharp and pressure.  No radiation to his arms or his back.  He tried his Imdur, aspirin and Brilinta without improvement.  He was out of his nitroglycerin tablets.  Pain lasted about 15 minutes and resolved after nitro spray by EMS.  He has associated nausea and shortness of breath.  Denies diaphoresis, emesis, fever, chills, abdominal pain, UTI symptoms.  He had another spell of chest pain in route to ED, and another episode while talking to me.  He currently rates his pain 5/10.  He denies leg swelling, orthopnea or PND.  He denies new medication.  He reports missing his medication including DAPT every other day on average.  He reports smoking cigarettes occasionally.  He states a pack lasts him about 10 days or more.  He denies binge drinking.  He denies recreational drug use.  Prefers to remain full code.  In ED, vital signs stable.  98% on RA.  CMP and CBC without significant finding.  Troponin 21.  EKG with sinus tachycardia to 105 but no acute ischemic finding.  Received IV morphine and Zofran.  IV heparin ordered.  Hospitalist service consulted for unstable angina.  Review of Systems: As mentioned in the history of present illness. All other systems reviewed and are negative. Past Medical History:  Diagnosis Date   CAD (coronary artery disease)    a. 11/2019 NSTEMI/PCI: LM min  irregs, LAD 33m (2.75x22 Resolute Onyx DES), 40d, D2 min irregs, LCX/LPAV min irregs, RCA 60ost, 40p/m, EF 45-50%; b. 11/2020 MV: EF 43%, small area of apical ischemia ->? over processing-->low risk.   Hyperlipidemia    Hypertension    Ischemic cardiomyopathy    a. 11/2019 LV Gram: EF 45-50%; b. 12/2019 Echo: EF 50% w/ mod mid-apical anteroseptal HK, GrI DD, nl RV fxn, mild Ao sclerosis.   Tobacco use 11/25/2019   Past Surgical History:  Procedure Laterality Date   CORONARY STENT INTERVENTION N/A 01/24/2020   Procedure: CORONARY STENT INTERVENTION;  Surgeon: Wellington Hampshire, MD;  Location: [place] [facility];  Service: Cardiovascular;  Laterality: N/A;   CORONARY STENT INTERVENTION N/A 05/20/2021   Procedure: CORONARY STENT INTERVENTION;  Surgeon: Wellington Hampshire, MD;  Location: [place] [facility];  Service: Cardiovascular;  Laterality: N/A;   INTRAVASCULAR ULTRASOUND/IVUS N/A 05/20/2021   Procedure: Intravascular Ultrasound/IVUS;  Surgeon: Wellington Hampshire, MD;  Location: [place] [facility];  Service: Cardiovascular;  Laterality: N/A;   IRRIGATION AND DEBRIDEMENT KNEE Left    LEFT HEART CATH AND CORONARY ANGIOGRAPHY N/A 01/24/2020   Procedure: LEFT HEART CATH AND CORONARY ANGIOGRAPHY;  Surgeon: Wellington Hampshire, MD;  Location: [place] [facility];  Service: Cardiovascular;  Laterality: N/A;   LEFT HEART CATH AND CORS/GRAFTS ANGIOGRAPHY N/A 05/20/2021   Procedure: LEFT HEART CATH AND CORS/GRAFTS ANGIOGRAPHY;  Surgeon: Wellington Hampshire, MD;  Location: [place]  [facility];  Service: Cardiovascular;  Laterality: N/A;   TYMPANOSTOMY TUBE PLACEMENT Bilateral    Social History:  reports that he quit smoking about 21 months ago. His smoking use included cigarettes. He has a 22.00 pack-year smoking history. He has never used smokeless tobacco. He reports current alcohol use of about 1.0 standard drink of alcohol per week. He reports that he does not use drugs.  No Known  Allergies  Family History  Problem Relation Age of Onset   Coronary artery disease Mother    Arrhythmia Mother        Pacemaker placed in 2021   Heart failure Mother    Coronary artery disease Father     Prior to Admission medications   Medication Sig Start Date End Date Taking? Authorizing Provider  amLODipine (NORVASC) 10 MG tablet Take 1 tablet (10 mg total) by mouth daily. 05/21/21  Yes Pennie Banter, DO  aspirin EC 81 MG EC tablet Take 1 tablet (81 mg total) by mouth daily. Swallow whole. 01/26/20  Yes Wieting, Richard, MD  atorvastatin (LIPITOR) 80 MG tablet Take 1 tablet (80 mg total) by mouth daily. 05/21/21  Yes Esaw Grandchild A, DO  isosorbide mononitrate (IMDUR) 60 MG 24 hr tablet Take 1.5 tablets (90 mg total) by mouth daily. 05/30/21 11/16/21 Yes Furth, Cadence H, PA-C  ticagrelor (BRILINTA) 90 MG TABS tablet Take 1 tablet (90 mg total) by mouth every 12 (twelve) hours. 01/30/20  Yes Alver Sorrow, NP  albuterol (VENTOLIN HFA) 108 (90 Base) MCG/ACT inhaler Inhale 2 puffs into the lungs every 6 (six) hours as needed for wheezing or shortness of breath. 11/27/19   [place], Alvira Philips, DO  carvedilol (COREG) 6.25 MG tablet Take 1 tablet (6.25 mg total) by mouth 2 (two) times daily with a meal. Patient not taking: Reported on 11/16/2021 05/18/20   Debbe Odea, MD  Multiple Vitamin (MULTIVITAMIN) tablet Take 1 tablet by mouth daily. Patient not taking: Reported on 11/16/2021    [provider]  Niacin (VITAMIN B-3 PO) Take by mouth daily. Patient not taking: Reported on 11/16/2021    [provider]  nitroGLYCERIN (NITROSTAT) 0.4 MG SL tablet Place 1 tablet (0.4 mg total) under the tongue every 5 (five) minutes x 3 doses as needed for chest pain. 10/23/20   Debbe Odea, MD  pantoprazole (PROTONIX) 40 MG tablet Take 1 tablet (40 mg total) by mouth daily. 01/25/20 06/27/21  Alford Highland, MD    Physical Exam: Vitals:   11/16/21 1000 11/16/21 1020 11/16/21  1025 11/16/21 1030  BP: (!) 146/85   (!) 147/95  Pulse: 95   98  Resp: 20   16  Temp:      TempSrc:      SpO2: 97%  98% 97%  Weight:  101.2 kg    Height:       GENERAL: No apparent distress.  Nontoxic. HEENT: MMM.  Vision and hearing grossly intact.  NECK: Supple.  No apparent JVD.  RESP:  No IWOB.  Fair aeration bilaterally. CVS:  RRR. Heart sounds normal.  ABD/GI/GU: BS+. Abd soft, NTND.  MSK/EXT:  Moves extremities. No apparent deformity. No edema.  SKIN: no apparent skin lesion or wound NEURO: Awake and alert. Oriented appropriately.  No apparent focal neuro deficit. PSYCH: Calm. Normal affect.   Data Reviewed: See HPI  Assessment and Plan: Principal Problem:   Unstable angina (HCC) Active Problems:   Tobacco use   Essential hypertension   Other hyperlipidemia  CAD (coronary artery disease)   Noncompliance   Unstable angina in patient with history of CAD and DES stent in 04/2021: History concerning.  Patient is noncompliant with DAPT.  Significant risk factor.  Initial troponin 21.  EKG without acute ischemic finding.  Low suspicion for PE or infectious process. -Continue IV heparin, IV morphine, low-dose aspirin, Brilinta, Coreg, Imdur and Lipitor -Cardiology consulted -Nitro as needed  Essential hypertension: SBP in the 140s. -Resume home antihypertensive meds after med rec  Noncompliance:  -Counseled on the importance of compliance with medication especially with his DAPT  Tobacco use disorder: Reports smoking about 2 cigarettes not consistently. -Encourage smoking cessation  Overweight Body mass index is 29.42 kg/m.    Advance Care Planning:   Code Status: Full Code   Consults: Cardiology  Family Communication: None at bedside  Severity of Illness: The appropriate patient status for this patient is OBSERVATION. Observation status is judged to be reasonable and necessary in order to provide the required intensity of service to ensure the patient's  safety. The patient's presenting symptoms, physical exam findings, and initial radiographic and laboratory data in the context of their medical condition is felt to place them at decreased risk for further clinical deterioration. Furthermore, it is anticipated that the patient will be medically stable for discharge from the hospital within 2 midnights of admission.   Author: Almon Hercules, MD 11/16/2021 12:00 PM  For on call review [email or web address].

## 2021-11-16 NOTE — Progress Notes (Signed)
[place] for initiation and monitoring of heparin infusion Indication: chest pain/ACS  No Known Allergies  Patient Measurements: Height: 6\' 1"  (185.4 cm) Weight: 101.2 kg (223 lb) IBW/kg (Calculated) : 79.9 Heparin Dosing Weight: 101.2 kg  Vital Signs: Temp: 98.3 F (36.8 C) (09/23 1645) Temp Source: Oral (09/23 0924) BP: 121/77 (09/23 1645) Pulse Rate: 88 (09/23 1645)  Labs: Recent Labs    11/16/21 0930 11/16/21 1137 11/16/21 1822  HGB 15.2  --   --   HCT 44.3  --   --   PLT 319  --   --   APTT  --  28  --   LABPROT  --  13.0  --   INR  --  1.0  --   HEPARINUNFRC  --   --  0.44  CREATININE 1.18  --   --   TROPONINIHS 21* 29*  --      Estimated Creatinine Clearance: 96.8 mL/min (by C-G formula based on SCr of 1.18 mg/dL).   Assessment: 46 year old male with PHM of CAD, hypertension, MI with stent  presented with chest pain. A review of dispense records reveals no chronic anticoagulation prior to arrival  9/23 1822   HL 0.44, therapeutic x 1  Goal of Therapy:  Heparin level 0.3-0.7 units/ml Monitor platelets by anticoagulation protocol: Yes   Plan:  Heparin level of 0.44 is therapeutic x 1, continue with current rate of 1600 units/hr Check HL in 6 hours for confirmation Daily CBC while on Heparin drip  Paulina Fusi, PharmD, BCPS 11/16/2021 7:20 PM

## 2021-11-16 NOTE — Progress Notes (Signed)
[place] for initiation and monitoring of heparin infusion Indication: chest pain/ACS  No Known Allergies  Patient Measurements: Height: 6\' 1"  (185.4 cm) IBW/kg (Calculated) : 79.9 Heparin Dosing Weight: 101.2 kg  Vital Signs: Temp: 97.7 F (36.5 C) (09/23 0924) Temp Source: Oral (09/23 0924) BP: 128/77 (09/23 0924) Pulse Rate: 97 (09/23 0924)  Labs: Recent Labs    11/16/21 0930  HGB 15.2  HCT 44.3  PLT 319  CREATININE 1.18  TROPONINIHS 21*    CrCl cannot be calculated (Unknown ideal weight.).   Assessment: 46 year old male with PHM of CAD, hypertension, MI with stent  presented with chest pain. A review of dispense records reveals no chronic anticoagulation prior to arrival  Goal of Therapy:  Heparin level 0.3-0.7 units/ml Monitor platelets by anticoagulation protocol: Yes   Plan:  Give 4000 units bolus x 1 Start heparin infusion at 1600 units/hr (rate based on data from previous admission 04/2021) Check anti-Xa level in 6 hours and daily while on heparin Continue to monitor H&H and platelets  Dallie Piles 11/16/2021,10:14 AM

## 2021-11-16 NOTE — Progress Notes (Signed)
*  PRELIMINARY RESULTS* Echocardiogram 2D Echocardiogram has been performed.  Logan Crawford 11/16/2021, 2:06 PM

## 2021-11-16 NOTE — ED Provider Notes (Signed)
[facility] Provider Note    Event Date/Time   First MD Initiated Contact with Patient 11/16/21 [phone]     (approximate)   History   Chest Pain   HPI  Logan Crawford is a 46 y.o. male sensitive history of CAD hypertension smoking history with stents history of intermittent compliance with his medication presents to the ER for evaluation of chest pain that woke him from sleep described as sharp as well as pressure in his chest as if something is sitting on it.  No radiation to arms or jaw no ripping or tearing pain through to his back.  No diaphoresis.  States it feels similar to his previous heart attacks.  States he has not been compliant with his medication.  States that symptoms did improve with nitro he did take aspirin this morning.  States he still has mild to moderate pain.     Physical Exam   Triage Vital Signs: ED Triage Vitals  Enc Vitals Group     BP 11/16/21 0924 128/77     Pulse Rate 11/16/21 0924 97     Resp 11/16/21 0924 (!) 22     Temp 11/16/21 0924 97.7 F (36.5 C)     Temp Source 11/16/21 0924 Oral     SpO2 11/16/21 0924 98 %     Weight --      Height 11/16/21 0928 6\' 1"  (1.854 m)     Head Circumference --      Peak Flow --      Pain Score 11/16/21 0928 4     Pain Loc --      Pain Edu? --      Excl. in [place]? --     Most recent vital signs: Vitals:   11/16/21 0924  BP: 128/77  Pulse: 97  Resp: (!) 22  Temp: 97.7 F (36.5 C)  SpO2: 98%     Constitutional: Alert  Eyes: Conjunctivae are normal.  Head: Atraumatic. Nose: No congestion/rhinnorhea. Mouth/Throat: Mucous membranes are moist.   Neck: Painless ROM.  Cardiovascular:   Good peripheral circulation. Respiratory: Normal respiratory effort.  No retractions.  Gastrointestinal: Soft and nontender.  Musculoskeletal:  no deformity Neurologic:  MAE spontaneously. No gross focal neurologic deficits are appreciated.  Skin:  Skin is warm, dry and intact. No rash  noted. Psychiatric: Mood and affect are normal. Speech and behavior are normal.    ED Results / Procedures / Treatments   Labs (all labs ordered are listed, but only abnormal results are displayed) Labs Reviewed  CBC WITH DIFFERENTIAL/PLATELET - Abnormal; Notable for the following components:      Result Value   Eosinophils Absolute 0.6 (*)    All other components within normal limits  COMPREHENSIVE METABOLIC PANEL - Abnormal; Notable for the following components:   Glucose, Bld 114 (*)    All other components within normal limits  TROPONIN I (HIGH SENSITIVITY) - Abnormal; Notable for the following components:   Troponin I (High Sensitivity) 21 (*)    All other components within normal limits  SARS CORONAVIRUS 2 BY RT PCR     EKG  ED ECG REPORT I, Merlyn Lot, the attending physician, personally viewed and interpreted this ECG.   Date: 11/16/2021  EKG Time: 15:47  Rate: 85  Rhythm: sinus  Axis: normal  Intervals: normal  ST&T Change: nonspecific st and t wave abn, twave abn less prominent as compared to prior    RADIOLOGY Please see ED Course for  my review and interpretation.  I personally reviewed all radiographic images ordered to evaluate for the above acute complaints and reviewed radiology reports and findings.  These findings were personally discussed with the patient.  Please see medical record for radiology report.    PROCEDURES:  Critical Care performed: yes  .Critical Care  Performed by: Willy Eddy, MD Authorized by: Willy Eddy, MD   Critical care provider statement:    Critical care time (minutes):  35   Critical care was necessary to treat or prevent imminent or life-threatening deterioration of the following conditions:  Cardiac failure   Critical care was time spent personally by me on the following activities:  Ordering and performing treatments and interventions, ordering and review of laboratory studies, ordering and review  of radiographic studies, pulse oximetry, re-evaluation of patient's condition, review of old charts, obtaining history from patient or surrogate, examination of patient, evaluation of patient's response to treatment, discussions with primary provider, discussions with consultants and development of treatment plan with patient or surrogate    MEDICATIONS ORDERED IN ED: Medications  nitroGLYCERIN (NITROSTAT) SL tablet 0.4 mg (has no administration in time range)  morphine (PF) 4 MG/ML injection 4 mg (4 mg Intravenous Given 11/16/21 1013)  ondansetron (ZOFRAN) injection 4 mg (4 mg Intravenous Given 11/16/21 1011)     IMPRESSION / MDM / ASSESSMENT AND PLAN / ED COURSE  I reviewed the triage vital signs and the nursing notes.                              Differential diagnosis includes, but is not limited to, ACS, pericarditis, esophagitis, boerhaaves, pe, dissection, pna, bronchitis, costochondritis  Patient presented to the ER for evaluation of symptoms as described above.  This presenting complaint could reflect a potentially life-threatening illness therefore the patient will be placed on continuous pulse oximetry and telemetry for monitoring.  Laboratory evaluation will be sent to evaluate for the above complaints.      Clinical Course as of 11/16/21 1023  Sat Nov 16, 2021  1022 X-ray on my review and interpretation without evidence of infiltrate or pneumothorax.  Troponin is mildly elevated but improved as compared to prior.  Given his extensive cardiac history known stents intermittent compliance with his medications and symptoms concerning for unstable angina will heparinize consult hospitalist for admission.  Have given IV morphine for pain.  Patient agreeable to plan. [PR]    Clinical Course User Index [PR] Willy Eddy, MD      FINAL CLINICAL IMPRESSION(S) / ED DIAGNOSES   Final diagnoses:  Unstable angina (HCC)     Rx / DC Orders   ED Discharge Orders     None         Note:  This document was prepared using Dragon voice recognition software and may include unintentional dictation errors.    Willy Eddy, MD 11/16/21 1023

## 2021-11-16 NOTE — Consult Note (Signed)
Cardiology Consultation:  Patient ID: Logan Crawford MRN: [id number]; DOB: 1976-02-21  Admit date: 11/16/2021 Date of Consult: 11/16/2021  Primary Care Provider: Merryl Hacker, No Primary Cardiologist: Kate Sable, MD  Primary Electrophysiologist:  None   Patient Profile:  Logan Crawford is a 46 y.o. male with a hx of CAD status post PCI, hypertension, hyperlipidemia, cocaine abuse who is being seen today for the evaluation of chest pain/elevated troponin at the request of Wendee Beavers, MD.  History of Present Illness:  Logan Crawford presents with 1 to 2 days of acute chest pain.  He reports last night he developed sharp chest pain while sitting.  Apparently symptoms resolved within a few minutes and he went to bed.  He awoke this morning at 7 AM with sharp chest pain again.  Symptoms appear to be coming and going.  Symptoms are occurring at rest.  He reports no association with exertion.  He does have an extensive history of CAD.  Underwent PCI to the mid LAD in 2021.  Admitted in March with an acute coronary syndrome found to have a severe stenosis after the mid LAD stent.  He has diffuse mild to moderate disease in the other vessels.  He is still using cocaine and tobacco products.  He tells me he used cocaine 3 weeks ago.  He is not taking his medications reliably.  He is also smoking cigarettes.  He reports he only takes his medications when he has chest pain.  Currently without any chest pain symptoms.  Laboratory data notable for normal serum creatinine at 1.18.  High-sensitivity troponin 21 and 29 on repeat.  Hemoglobin 15.2.  Blood pressure is stable.  EKG demonstrates sinus tachycardia heart rate 105 with anterior Q waves.  He also has inferior Q waves.  No acute ST elevation or acute changes compared to prior tracing.  Heart Pathway Score:       Past Medical History: Past Medical History:  Diagnosis Date   CAD (coronary artery disease)    a. 11/2019 NSTEMI/PCI: LM min irregs, LAD 53m  (2.75x22 Resolute Onyx DES), 40d, D2 min irregs, LCX/LPAV min irregs, RCA 60ost, 40p/m, EF 45-50%; b. 11/2020 MV: EF 43%, small area of apical ischemia ->? over processing-->low risk.   Hyperlipidemia    Hypertension    Ischemic cardiomyopathy    a. 11/2019 LV Gram: EF 45-50%; b. 12/2019 Echo: EF 50% w/ mod mid-apical anteroseptal HK, GrI DD, nl RV fxn, mild Ao sclerosis.   Tobacco use 11/25/2019    Past Surgical History: Past Surgical History:  Procedure Laterality Date   CORONARY STENT INTERVENTION N/A 01/24/2020   Procedure: CORONARY STENT INTERVENTION;  Surgeon: Wellington Hampshire, MD;  Location: [facility];  Service: Cardiovascular;  Laterality: N/A;   CORONARY STENT INTERVENTION N/A 05/20/2021   Procedure: CORONARY STENT INTERVENTION;  Surgeon: Wellington Hampshire, MD;  Location: [facility];  Service: Cardiovascular;  Laterality: N/A;   INTRAVASCULAR ULTRASOUND/IVUS N/A 05/20/2021   Procedure: Intravascular Ultrasound/IVUS;  Surgeon: Wellington Hampshire, MD;  Location: [facility];  Service: Cardiovascular;  Laterality: N/A;   IRRIGATION AND DEBRIDEMENT KNEE Left    LEFT HEART CATH AND CORONARY ANGIOGRAPHY N/A 01/24/2020   Procedure: LEFT HEART CATH AND CORONARY ANGIOGRAPHY;  Surgeon: Wellington Hampshire, MD;  Location: [facility];  Service: Cardiovascular;  Laterality: N/A;   LEFT HEART CATH AND CORS/GRAFTS ANGIOGRAPHY N/A 05/20/2021   Procedure: LEFT HEART CATH AND CORS/GRAFTS ANGIOGRAPHY;  Surgeon: [name]  A, MD;  Location: [facility];  Service: Cardiovascular;  Laterality: N/A;   TYMPANOSTOMY TUBE PLACEMENT Bilateral      Home Medications:  Prior to Admission medications   Medication Sig Start Date End Date Taking? Authorizing Provider  amLODipine (NORVASC) 10 MG tablet Take 1 tablet (10 mg total) by mouth daily. 05/21/21  Yes Ezekiel Slocumb, DO  aspirin EC 81 MG EC tablet Take 1 tablet (81 mg total) by mouth daily. Swallow  whole. 01/26/20  Yes Wieting, Richard, MD  atorvastatin (LIPITOR) 80 MG tablet Take 1 tablet (80 mg total) by mouth daily. 05/21/21  Yes Nicole Kindred A, DO  isosorbide mononitrate (IMDUR) 60 MG 24 hr tablet Take 1.5 tablets (90 mg total) by mouth daily. 05/30/21 11/16/21 Yes Furth, Cadence H, PA-C  ticagrelor (BRILINTA) 90 MG TABS tablet Take 1 tablet (90 mg total) by mouth every 12 (twelve) hours. 01/30/20  Yes Loel Dubonnet, NP  albuterol (VENTOLIN HFA) 108 (90 Base) MCG/ACT inhaler Inhale 2 puffs into the lungs every 6 (six) hours as needed for wheezing or shortness of breath. 11/27/19   [place] Donnamarie Poag, DO  carvedilol (COREG) 6.25 MG tablet Take 1 tablet (6.25 mg total) by mouth 2 (two) times daily with a meal. Patient not taking: Reported on 11/16/2021 05/18/20   Kate Sable, MD  Multiple Vitamin (MULTIVITAMIN) tablet Take 1 tablet by mouth daily. Patient not taking: Reported on 11/16/2021    [provider]  Niacin (VITAMIN B-3 PO) Take by mouth daily. Patient not taking: Reported on 11/16/2021    [provider]  nitroGLYCERIN (NITROSTAT) 0.4 MG SL tablet Place 1 tablet (0.4 mg total) under the tongue every 5 (five) minutes x 3 doses as needed for chest pain. 10/23/20   Kate Sable, MD  pantoprazole (PROTONIX) 40 MG tablet Take 1 tablet (40 mg total) by mouth daily. 01/25/20 06/27/21  Loletha Grayer, MD    Inpatient Medications: Scheduled Meds:  amLODipine  10 mg Oral Daily   [START ON 11/17/2021] aspirin EC  81 mg Oral Daily   [START ON 11/17/2021] atorvastatin  80 mg Oral Daily   carvedilol  6.25 mg Oral BID WC   heparin  4,000 Units Intravenous Once   isosorbide mononitrate  90 mg Oral Daily   pantoprazole  40 mg Oral Daily   ticagrelor  90 mg Oral Q12H   Continuous Infusions:  heparin     PRN Meds: acetaminophen, albuterol, morphine injection, nitroGLYCERIN, ondansetron (ZOFRAN) IV  Allergies:    No Known Allergies  Social History:   Social  History   Socioeconomic History   Marital status: Single    Spouse name: Not on file   Number of children: Not on file   Years of education: Not on file   Highest education level: Not on file  Occupational History   Not on file  Tobacco Use   Smoking status: Former    Packs/day: 1.00    Years: 22.00    Total pack years: 22.00    Types: Cigarettes    Quit date: 01/23/2020    Years since quitting: 1.8   Smokeless tobacco: Never   Tobacco comments:    Smokes 1-2 cigarettes daily    Vaping Use   Vaping Use: Never used  Substance and Sexual Activity   Alcohol use: Yes    Alcohol/week: 1.0 standard drink of alcohol    Types: 1 Shots of liquor per week    Comment: socially   Drug  use: No   Sexual activity: Not on file  Other Topics Concern   Not on file  Social History Narrative   Not on file   Social Determinants of Health   Financial Resource Strain: Not on file  Food Insecurity: Not on file  Transportation Needs: Not on file  Physical Activity: Not on file  Stress: Not on file  Social Connections: Not on file  Intimate Partner Violence: Not on file     Family History:   Family History  Problem Relation Age of Onset   Coronary artery disease Mother    Arrhythmia Mother        Pacemaker placed in 2021   Heart failure Mother    Coronary artery disease Father      ROS:  All other ROS reviewed and negative. Pertinent positives noted in the HPI.     Physical Exam/Data:   Vitals:   11/16/21 1020 11/16/21 1025 11/16/21 1030 11/16/21 1223  BP:   (!) 147/95 (!) 139/92  Pulse:   98 78  Resp:   16 20  Temp:    98.3 F (36.8 C)  TempSrc:      SpO2:  98% 97% 99%  Weight: 101.2 kg     Height:       No intake or output data in the 24 hours ending 11/16/21 1227     11/16/2021   10:20 AM 06/27/2021    3:39 PM 05/30/2021   10:14 AM  Last 3 Weights  Weight (lbs) 223 lb 219 lb 214 lb 9.6 oz  Weight (kg) 101.152 kg 99.338 kg 97.342 kg    Body mass index is 29.42  kg/m.  General: Well nourished, well developed, in no acute distress Head: Atraumatic, normal size  Eyes: PEERLA, EOMI  Neck: Supple, no JVD Endocrine: No thryomegaly Cardiac: Normal S1, S2; RRR; no murmurs, rubs, or gallops Lungs: Clear to auscultation bilaterally, no wheezing, rhonchi or rales  Abd: Soft, nontender, no hepatomegaly  Ext: No edema, pulses 2+ Musculoskeletal: No deformities, BUE and BLE strength normal and equal Skin: Warm and dry, no rashes   Neuro: Alert and oriented to person, place, time, and situation, CNII-XII grossly intact, no focal deficits  Psych: Normal mood and affect   EKG:  The EKG was personally reviewed and demonstrates: Sinus tachycardia heart rate 105, old anteroseptal infarct, inferior infarct, no acute ischemic changes Telemetry:  Telemetry was personally reviewed and demonstrates: None  Relevant CV Studies: TTE 07/16/2021  1. Left ventricular ejection fraction, by estimation, is 60 to 65%. The  left ventricle has normal function. The left ventricle has no regional  wall motion abnormalities. There is moderate left ventricular hypertrophy.  Left ventricular diastolic  parameters are consistent with Grade II diastolic dysfunction  (pseudonormalization). The average left ventricular global longitudinal  strain is -11.7 %.   2. Right ventricular systolic function is normal. The right ventricular  size is normal. Tricuspid regurgitation signal is inadequate for assessing  PA pressure.   3. The mitral valve is normal in structure. Mild mitral valve  regurgitation. No evidence of mitral stenosis.   4. The aortic valve is normal in structure. Aortic valve regurgitation is  not visualized. No aortic stenosis is present.   5. The inferior vena cava is normal in size with greater than 50%  respiratory variability, suggesting right atrial pressure of 3 mmHg.  [place] 3/27/202   Dist LAD lesion is 40% stenosed.   Prox RCA to Mid RCA lesion is 40%  stenosed.   Ost RCA to Prox RCA lesion is 50% stenosed.   RPDA lesion is 40% stenosed.   Ost LM lesion is 30% stenosed.   Mid LAD lesion is 85% stenosed.   2nd Diag lesion is 60% stenosed.   A drug-eluting stent was successfully placed using a [place] [id number].   Balloon angioplasty was performed using a BALLN TREK RX 2.5X12.   Post intervention, there is a 0% residual stenosis.   Post intervention, there is a 30% residual stenosis.   LV end diastolic pressure is moderately elevated.   The left ventricular ejection fraction is 50-55% by visual estimate.   Laboratory Data: High Sensitivity Troponin:   Recent Labs  Lab 11/16/21 0930  TROPONINIHS 21*     Cardiac EnzymesNo results for input(s): "TROPONINI" in the last 168 hours. No results for input(s): "TROPIPOC" in the last 168 hours.  Chemistry Recent Labs  Lab 11/16/21 0930  NA 140  K 3.9  CL 110  CO2 22  GLUCOSE 114*  BUN 17  CREATININE 1.18  CALCIUM 9.2  GFRNONAA >60  ANIONGAP 8    Recent Labs  Lab 11/16/21 0930  PROT 7.0  ALBUMIN 3.5  AST 29  ALT 20  ALKPHOS 79  BILITOT 0.6   Hematology Recent Labs  Lab 11/16/21 0930  WBC 8.2  RBC 5.15  HGB 15.2  HCT 44.3  MCV 86.0  MCH 29.5  MCHC 34.3  RDW 13.7  PLT 319   BNPNo results for input(s): "BNP", "PROBNP" in the last 168 hours.  DDimer No results for input(s): "DDIMER" in the last 168 hours.  Radiology/Studies:  DG Chest Portable 1 View  Result Date: 11/16/2021 CLINICAL DATA:  Chest pain beginning last night. Improved with sublingual nitroglycerin. EXAM: PORTABLE CHEST 1 VIEW COMPARISON:  06/27/2021 FINDINGS: The heart size and mediastinal contours are within normal limits. Both lungs are clear. The visualized skeletal structures are unremarkable. IMPRESSION: No active disease. Electronically Signed   By: Marlaine Hind M.D.   On: 11/16/2021 10:15    Assessment and Plan:   #Acute chest pain, unstable angina #History of CAD status post PCI  to the mid LAD x2 #Medication noncompliance #Cocaine abuse -Presents with acute sharp chest discomfort.  Troponin is minimally elevated 21 and 29 on repeat.  His EKG demonstrates no acute changes compared to his prior tracing. -Known history of CAD with PCI to the LAD in 2021.  He underwent repeat intervention to the mid LAD in March of this year.  He has diffuse 50% disease in the RCA as well as 60% and a second diagonal branch. -Ongoing cocaine use.  Not taking medications reliably. -Overall symptoms are concerning for unstable angina.  Would recommend to continue his aspirin and home Brilinta.  Again he has not been taking this.  We will start him on a heparin drip. -We will control his medications with his home beta-blocker.  He is on a nonselective beta-blocker which is okay.  I am not bothered by this on cocaine. -We will give him his home Imdur.  If symptoms persist we will put him on a nitroglycerin drip. -We will plan to treat this medically for now.  We will tentatively plan for left heart catheterization on Monday. -We will only pursue left heart catheterization if symptoms cannot be managed on medical therapy, he develops congestive heart failure or electrical instability such as ventricular arrhythmias.  Would remain on telemetry. -I have reordered a lipid profile.  Continue high  intensity statin for now as well.  #HTN -Continue Coreg, amlodipine, Imdur.  #Hyperlipidemia -Lipitor 80.  Repeat lipids tomorrow.  #Cocaine Abuse -Repeat UDS.  Cessation advised.  For questions or updates, please contact [place] Please consult [email or web address] for contact info under   Signed, [place] T. Audie Box, MD, [place]  11/16/2021 12:27 PM

## 2021-11-17 DIAGNOSIS — F1910 Other psychoactive substance abuse, uncomplicated: Secondary | ICD-10-CM | POA: Diagnosis present

## 2021-11-17 DIAGNOSIS — E663 Overweight: Secondary | ICD-10-CM

## 2021-11-17 DIAGNOSIS — Z6827 Body mass index (BMI) 27.0-27.9, adult: Secondary | ICD-10-CM

## 2021-11-17 DIAGNOSIS — I214 Non-ST elevation (NSTEMI) myocardial infarction: Secondary | ICD-10-CM

## 2021-11-17 LAB — CBC
HCT: 41.5 % (ref 39.0–52.0)
Hemoglobin: 14.3 g/dL (ref 13.0–17.0)
MCH: 29.9 pg (ref 26.0–34.0)
MCHC: 34.5 g/dL (ref 30.0–36.0)
MCV: 86.6 fL (ref 80.0–100.0)
Platelets: 309 10*3/uL (ref 150–400)
RBC: 4.79 MIL/uL (ref 4.22–5.81)
RDW: 14 % (ref 11.5–15.5)
WBC: 7.4 10*3/uL (ref 4.0–10.5)
nRBC: 0 % (ref 0.0–0.2)

## 2021-11-17 LAB — HEPARIN LEVEL (UNFRACTIONATED): Heparin Unfractionated: 0.39 IU/mL (ref 0.30–0.70)

## 2021-11-17 LAB — LIPID PANEL
Cholesterol: 179 mg/dL (ref 0–200)
HDL: 29 mg/dL — ABNORMAL LOW (ref >40)
LDL Cholesterol: 93 mg/dL (ref 0–99)
Total CHOL/HDL Ratio: 6.2 RATIO
Triglycerides: 284 mg/dL — ABNORMAL HIGH (ref <150)
VLDL: 57 mg/dL — ABNORMAL HIGH (ref 0–40)

## 2021-11-17 LAB — BRAIN NATRIURETIC PEPTIDE: B Natriuretic Peptide: 23.6 pg/mL (ref 0.0–100.0)

## 2021-11-17 MED ORDER — ASPIRIN 81 MG PO CHEW
81.0000 mg | CHEWABLE_TABLET | ORAL | Status: DC
  Filled 2021-11-17: qty 1

## 2021-11-17 MED ORDER — SODIUM CHLORIDE 0.9 % WEIGHT BASED INFUSION
3.0000 mL/kg/h | INTRAVENOUS | Status: DC
  Administered 2021-11-18: 3 mL/kg/h via INTRAVENOUS

## 2021-11-17 MED ORDER — SODIUM CHLORIDE 0.9 % IV SOLN: 250.0000 mL | INTRAVENOUS | Status: DC | PRN

## 2021-11-17 MED ORDER — SODIUM CHLORIDE 0.9% FLUSH: 3.0000 mL | Freq: Two times a day (BID) | INTRAVENOUS | Status: DC

## 2021-11-17 MED ORDER — SODIUM CHLORIDE 0.9 % WEIGHT BASED INFUSION: 1.0000 mL/kg/h | INTRAVENOUS | Status: DC

## 2021-11-17 MED ORDER — SODIUM CHLORIDE 0.9% FLUSH: 3.0000 mL | INTRAVENOUS | Status: DC | PRN

## 2021-11-17 NOTE — H&P (View-Only) (Signed)
Cardiology Progress Note  Patient ID: Logan Crawford MRN: [id number] DOB: Mar 29, 1975 Date of Encounter: 11/17/2021  Primary Cardiologist: Kate Sable, MD  Subjective   Chief Complaint: Chest pain  HPI: Intermittent chest pain last night.  Seems to be improved with nitroglycerin.  Troponin is minimally elevated.  Echo was normal.  UDS positive for cocaine.  ROS:  All other ROS reviewed and negative. Pertinent positives noted in the HPI.     Inpatient Medications  Scheduled Meds:  amLODipine  10 mg Oral Daily   aspirin EC  81 mg Oral Daily   atorvastatin  80 mg Oral Daily   carvedilol  6.25 mg Oral BID WC   isosorbide mononitrate  90 mg Oral Daily   pantoprazole  40 mg Oral Daily   ticagrelor  90 mg Oral Q12H   Continuous Infusions:  heparin 1,600 Units/hr (11/17/21 0801)   PRN Meds: acetaminophen, albuterol, morphine injection, nitroGLYCERIN, ondansetron (ZOFRAN) IV   Vital Signs   Vitals:   11/17/21 0040 11/17/21 0413 11/17/21 0525 11/17/21 0928  BP: 139/80 (!) 141/84 121/81 129/87  Pulse: 89 78 85 89  Resp: 16 17 20 18   Temp: 98.2 F (36.8 C) 98.3 F (36.8 C) 98.1 F (36.7 C) (!) 97.5 F (36.4 C)  TempSrc:  Oral Oral Oral  SpO2: 99% 100% 97% 99%  Weight:   100.8 kg   Height:        Intake/Output Summary (Last 24 hours) at 11/17/2021 1035 Last data filed at 11/17/2021 0801 Gross per 24 hour  Intake 351.06 ml  Output 100 ml  Net 251.06 ml      11/17/2021    5:25 AM 11/16/2021   10:20 AM 06/27/2021    3:39 PM  Last 3 Weights  Weight (lbs) 222 lb 3.2 oz 223 lb 219 lb  Weight (kg) 100.789 kg 101.152 kg 99.338 kg      Telemetry  Overnight telemetry shows sinus rhythm in the 90s, which I personally reviewed.   ECG  The most recent ECG shows sinus tachycardia heart rate 105, old anteroseptal infarct, inferior infarct, no acute ischemic changes, unchanged from prior, which I personally reviewed.   Physical Exam   Vitals:   11/17/21 0040 11/17/21  0413 11/17/21 0525 11/17/21 0928  BP: 139/80 (!) 141/84 121/81 129/87  Pulse: 89 78 85 89  Resp: 16 17 20 18   Temp: 98.2 F (36.8 C) 98.3 F (36.8 C) 98.1 F (36.7 C) (!) 97.5 F (36.4 C)  TempSrc:  Oral Oral Oral  SpO2: 99% 100% 97% 99%  Weight:   100.8 kg   Height:        Intake/Output Summary (Last 24 hours) at 11/17/2021 1035 Last data filed at 11/17/2021 0801 Gross per 24 hour  Intake 351.06 ml  Output 100 ml  Net 251.06 ml       11/17/2021    5:25 AM 11/16/2021   10:20 AM 06/27/2021    3:39 PM  Last 3 Weights  Weight (lbs) 222 lb 3.2 oz 223 lb 219 lb  Weight (kg) 100.789 kg 101.152 kg 99.338 kg    Body mass index is 29.32 kg/m.  General: Well nourished, well developed, in no acute distress Head: Atraumatic, normal size  Eyes: PEERLA, EOMI  Neck: Supple, no JVD Endocrine: No thryomegaly Cardiac: Normal S1, S2; RRR; no murmurs, rubs, or gallops Lungs: Clear to auscultation bilaterally, no wheezing, rhonchi or rales  Abd: Soft, nontender, no hepatomegaly  Ext: No edema, pulses 2+ Musculoskeletal:  No deformities, BUE and BLE strength normal and equal Skin: Warm and dry, no rashes   Neuro: Alert and oriented to person, place, time, and situation, CNII-XII grossly intact, no focal deficits  Psych: Normal mood and affect   Labs  High Sensitivity Troponin:   Recent Labs  Lab 11/16/21 0930 11/16/21 1137  TROPONINIHS 21* 29*     Cardiac EnzymesNo results for input(s): "TROPONINI" in the last 168 hours. No results for input(s): "TROPIPOC" in the last 168 hours.  Chemistry Recent Labs  Lab 11/16/21 0930  NA 140  K 3.9  CL 110  CO2 22  GLUCOSE 114*  BUN 17  CREATININE 1.18  CALCIUM 9.2  PROT 7.0  ALBUMIN 3.5  AST 29  ALT 20  ALKPHOS 79  BILITOT 0.6  GFRNONAA >60  ANIONGAP 8    Hematology Recent Labs  Lab 11/16/21 0930 11/17/21 0043  WBC 8.2 7.4  RBC 5.15 4.79  HGB 15.2 14.3  HCT 44.3 41.5  MCV 86.0 86.6  MCH 29.5 29.9  MCHC 34.3 34.5  RDW 13.7  14.0  PLT 319 309   BNP Recent Labs  Lab 11/17/21 0043  BNP 23.6    DDimer No results for input(s): "DDIMER" in the last 168 hours.   Radiology  ECHOCARDIOGRAM COMPLETE  Result Date: 11/16/2021    ECHOCARDIOGRAM REPORT   Patient Name:   RASHEE MARSCHALL Date of Exam: 11/16/2021 Medical Rec #:  [id number]        Height:       73.0 in Accession #:    [id number]       Weight:       223.0 lb Date of Birth:  [birth year]       BSA:          2.254 m Patient Age:    46 years         BP:           139/92 mmHg Patient Gender: M                HR:           85 bpm. Exam Location:  [facility] Procedure: 2D Echo, Strain Analysis and 3D Echo Indications:     NSTEMI I21.4  History:         Patient has prior history of Echocardiogram examinations, most                  recent 07/16/2021.  Sonographer:     Kathlen Brunswick RDCS Referring Phys:  [id number] [place] Diagnosing Phys: Eleonore Chiquito MD  Sonographer Comments: Global longitudinal strain was attempted. IMPRESSIONS  1. Left ventricular ejection fraction, by estimation, is 60 to 65%. The left ventricle has normal function. The left ventricle has no regional wall motion abnormalities. There is moderate concentric left ventricular hypertrophy. Left ventricular diastolic parameters are consistent with Grade I diastolic dysfunction (impaired relaxation).  2. Right ventricular systolic function is normal. The right ventricular size is normal. Tricuspid regurgitation signal is inadequate for assessing PA pressure.  3. The mitral valve is grossly normal. Trivial mitral valve regurgitation. No evidence of mitral stenosis.  4. The aortic valve is tricuspid. There is mild calcification of the aortic valve. Aortic valve regurgitation is not visualized. Aortic valve sclerosis is present, with no evidence of aortic valve stenosis.  5. The inferior vena cava is normal in size with greater than 50% respiratory variability, suggesting right atrial pressure of 3 mmHg.  Comparison(s): No  significant change from prior study. FINDINGS  Left Ventricle: Left ventricular ejection fraction, by estimation, is 60 to 65%. The left ventricle has normal function. The left ventricle has no regional wall motion abnormalities. Global longitudinal strain performed but not reported based on interpreter judgement due to suboptimal tracking. The left ventricular internal cavity size was normal in size. There is moderate concentric left ventricular hypertrophy. Left ventricular diastolic parameters are consistent with Grade I diastolic dysfunction (impaired relaxation). Right Ventricle: The right ventricular size is normal. No increase in right ventricular wall thickness. Right ventricular systolic function is normal. Tricuspid regurgitation signal is inadequate for assessing PA pressure. Left Atrium: Left atrial size was normal in size. Right Atrium: Right atrial size was normal in size. Pericardium: There is no evidence of pericardial effusion. Mitral Valve: The mitral valve is grossly normal. Trivial mitral valve regurgitation. No evidence of mitral valve stenosis. Tricuspid Valve: The tricuspid valve is grossly normal. Tricuspid valve regurgitation is not demonstrated. No evidence of tricuspid stenosis. Aortic Valve: The aortic valve is tricuspid. There is mild calcification of the aortic valve. Aortic valve regurgitation is not visualized. Aortic valve sclerosis is present, with no evidence of aortic valve stenosis. Aortic valve peak gradient measures 9.4 mmHg. Pulmonic Valve: The pulmonic valve was grossly normal. Pulmonic valve regurgitation is not visualized. No evidence of pulmonic stenosis. Aorta: The aortic root and ascending aorta are structurally normal, with no evidence of dilitation. Venous: The right lower pulmonary vein is normal. The inferior vena cava is normal in size with greater than 50% respiratory variability, suggesting right atrial pressure of 3 mmHg. IAS/Shunts: The atrial  septum is grossly normal.  LEFT VENTRICLE PLAX 2D LVIDd:         3.80 cm     Diastology LVIDs:         2.50 cm     LV e' medial:    5.50 cm/s LV PW:         1.30 cm     LV E/e' medial:  11.9 LV IVS:        1.50 cm     LV e' lateral:   6.35 cm/s LVOT diam:     2.00 cm     LV E/e' lateral: 10.3 LV SV:         66 LV SV Index:   29 LVOT Area:     3.14 cm                             3D Volume EF: LV Volumes (MOD)           3D EF:        55 % LV vol d, MOD A2C: 64.4 ml LV EDV:       96 ml LV vol d, MOD A4C: 62.1 ml LV ESV:       43 ml LV vol s, MOD A2C: 27.6 ml LV SV:        52 ml LV vol s, MOD A4C: 23.4 ml LV SV MOD A2C:     36.8 ml LV SV MOD A4C:     62.1 ml LV SV MOD BP:      37.5 ml RIGHT VENTRICLE RV Basal diam:  2.90 cm RV S prime:     15.30 cm/s TAPSE (M-mode): 1.8 cm LEFT ATRIUM             Index        RIGHT  ATRIUM           Index LA diam:        3.30 cm 1.46 cm/m   RA Area:     11.30 cm LA Vol (A2C):   32.3 ml 14.33 ml/m  RA Volume:   23.00 ml  10.21 ml/m LA Vol (A4C):   32.1 ml 14.24 ml/m LA Biplane Vol: 33.0 ml 14.64 ml/m  AORTIC VALVE                 PULMONIC VALVE AV Area (Vmax): 2.39 cm     PV Vmax:       1.41 m/s AV Vmax:        153.67 cm/s  PV Peak grad:  7.9 mmHg AV Peak Grad:   9.4 mmHg LVOT Vmax:      117.00 cm/s LVOT Vmean:     78.900 cm/s LVOT VTI:       0.210 m  AORTA Ao Root diam: 3.50 cm Ao Asc diam:  2.90 cm MITRAL VALVE MV Area (PHT): 4.16 cm    SHUNTS MV Decel Time: 183 msec    Systemic VTI:  0.21 m MV E velocity: 65.25 cm/s  Systemic Diam: 2.00 cm MV A velocity: 84.90 cm/s MV E/A ratio:  0.77 Eleonore Chiquito MD Electronically signed by Eleonore Chiquito MD Signature Date/Time: 11/16/2021/3:02:31 PM    Final    DG Chest Portable 1 View  Result Date: 11/16/2021 CLINICAL DATA:  Chest pain beginning last night. Improved with sublingual nitroglycerin. EXAM: PORTABLE CHEST 1 VIEW COMPARISON:  06/27/2021 FINDINGS: The heart size and mediastinal contours are within normal limits. Both lungs are  clear. The visualized skeletal structures are unremarkable. IMPRESSION: No active disease. Electronically Signed   By: Marlaine Hind M.D.   On: 11/16/2021 10:15    Cardiac Studies  TTE 11/16/2021  1. Left ventricular ejection fraction, by estimation, is 60 to 65%. The  left ventricle has normal function. The left ventricle has no regional  wall motion abnormalities. There is moderate concentric left ventricular  hypertrophy. Left ventricular  diastolic parameters are consistent with Grade I diastolic dysfunction  (impaired relaxation).   2. Right ventricular systolic function is normal. The right ventricular  size is normal. Tricuspid regurgitation signal is inadequate for assessing  PA pressure.   3. The mitral valve is grossly normal. Trivial mitral valve  regurgitation. No evidence of mitral stenosis.   4. The aortic valve is tricuspid. There is mild calcification of the  aortic valve. Aortic valve regurgitation is not visualized. Aortic valve  sclerosis is present, with no evidence of aortic valve stenosis.   5. The inferior vena cava is normal in size with greater than 50%  respiratory variability, suggesting right atrial pressure of 3 mmHg.   Patient Profile  NICOLAAS LANDUYT is a 46 y.o. male with CAD status post PCI, hypertension, hyperlipidemia, substance abuse who was admitted on 11/16/2021 for unstable angina.  Assessment & Plan   #Unstable angina #Recent PCI to the mid LAD #Medication noncompliance #Cocaine abuse -History of recent PCI to the mid LAD in March 2023.  Underwent PCI to the more proximal segment of the mid LAD 2021.  He also has diffuse 50% disease in the RCA as well as 60% disease in the second diagonal branch. -Not taking his medications.  Ongoing cocaine use. -Now with symptoms of chest discomfort similar to prior episodes as well as minimally elevated troponins. -EKG unchanged from prior.  Echocardiogram shows normal LV function  with no wall motion  abnormality. -Still having intermittent chest discomfort.  Nitro seems to help. -Tentatively planning for left heart catheterization tomorrow.  N.p.o. at midnight. -Continue aspirin and Brilinta.  Again he is not taking this at home.  He is on a heparin drip.  Currently on carvedilol, amlodipine and Imdur.  If needed could add nitro drip.  Seems comfortable in the room during my examination.  #Hypertension -Continue Coreg, amlodipine, Imdur.  #Hyperlipidemia -Lipitor 80.  Repeat lipid should be drawn.  #Cocaine abuse -Cessation advised    For questions or updates, please contact [place] Please consult [email or web address] for contact info under        Signed, [place] T. Audie Box, MD, [place]  11/17/2021 10:35 AM

## 2021-11-17 NOTE — Assessment & Plan Note (Signed)
Counseled.  At this time, declines nicotine patch

## 2021-11-17 NOTE — Assessment & Plan Note (Signed)
History of PCI in March 2023.  Also with diffuse disease and has been noncompliant with medication plus ongoing drug use.  Ejection fraction unremarkable on echocardiogram.  On aspirin and Brilinta and heparin drip.  Tentative plans by cardiology for cardiac cath tomorrow.  Mostly chest pain-free

## 2021-11-17 NOTE — Hospital Course (Addendum)
46 year old male past medical history of CAD status post angioplasty and stent to mid LAD in March 2023 as well as hypertension and tobacco abuse who presents to the emergency room on 9/23 with complaints of chest pain.  Specifically, patient has had intermittent chest pain for the past month and on the morning of presentation, woke up with sharp anterior pressure-like pain which lasted for about 15 minutes and resolved after nitro spray.  Patient reports only taking his medicine including DAPT every other day and smokes a 2 or 3 cigarettes daily as well as uses cocaine.  EKG noteworthy for inferior and anterior Q waves.  Echocardiogram noteworthy for grade 1 diastolic dysfunction. Patient started on heparin with plan for cardiac catheterization on Monday, 9/25.     Consultants:  cardiology  Procedures: 11/18/21 cardiac catheterization       ASSESSMENT & PLAN:   Active Problems:   NSTEMI (non-ST elevated myocardial infarction) ([place])   Polysubstance abuse ([place])   Essential hypertension   Other hyperlipidemia   Tobacco use   Noncompliance   Overweight with body mass index (BMI) of 27 to 27.9 in adult  NSTEMI (non-ST elevated myocardial infarction) ([place]) History of PCI in March 2023.  Also with diffuse disease and has been noncompliant with medication plus ongoing drug use.  Ejection fraction unremarkable on echocardiogram.  On aspirin and Brilinta and heparin drip. Cardiac cath 11/18/21 significant 2 vessel coronary disease, cardiology to discuss options for further management   Polysubstance abuse ([place]) Counseled on drug use.  Urine drug screen positive for cocaine, THC and opiates.  Noncompliance Counseled on the dangers of missing his medication.  Overweight with body mass index (BMI) of 27 to 27.9 in adult Meets criteria with BMI at 29  Tobacco use Counseled.  At this time, declines nicotine patch  Essential hypertension Restarted on medications.  Blood pressure remained  stable.  Other hyperlipidemia Triglycerides of 284 and HDL 29 with LDL 93.  On high-dose Lipitor.    DVT prophylaxis: heparin infusion  Pertinent IV fluids/nutrition: none, tolerating po  Central lines / invasive devices: none  Code Status: FULL Family Communication: none at this time   Disposition: inpatient  TOC needs: none at this time Barriers to discharge / significant pending items: significant 2 vessel coronary disease, cardiology to discuss options for further management

## 2021-11-17 NOTE — Assessment & Plan Note (Signed)
Restarted on medications.  Blood pressure remained stable.

## 2021-11-17 NOTE — Progress Notes (Signed)
[facility] Progress Note  Patient: Logan Crawford    FXT:[id number]  DOA: 11/16/2021    Date of Service: the patient was seen and examined on 11/17/2021  Brief hospital course: 46 year old male past medical history of CAD status post angioplasty and stent to mid LAD in March 2023 as well as hypertension and tobacco abuse who presents to the emergency room on 9/23 with complaints of chest pain.  Specifically, patient has had intermittent chest pain for the past month and on the morning of presentation, woke up with sharp anterior pressure-like pain which lasted for about 15 minutes and resolved after nitro spray.  Patient reports only taking his medicine including DAPT every other day and smokes a 2 or 3 cigarettes daily as well as uses cocaine.  EKG noteworthy for inferior and anterior Q waves.  Patient started on heparin with tentative plan for cardiac catheterization on Monday, 9/25.  Echocardiogram noteworthy for grade 1 diastolic dysfunction.  Assessment and Plan: Assessment and Plan: NSTEMI (non-ST elevated myocardial infarction) [facility]) History of PCI in March 2023.  Also with diffuse disease and has been noncompliant with medication plus ongoing drug use.  Ejection fraction unremarkable on echocardiogram.  On aspirin and Brilinta and heparin drip.  Tentative plans by cardiology for cardiac cath tomorrow.  Mostly chest pain-free  Polysubstance abuse ([place]) Counseled on drug use.  Urine drug screen positive for cocaine, THC and opiates.  Essential hypertension Restarted on medications.  Blood pressure remained stable.  Other hyperlipidemia Triglycerides of 284 and HDL 29 with LDL 93.  On high-dose Lipitor.  Tobacco use Counseled.  At this time, declines nicotine patch  Noncompliance Counseled on the dangers of missing his medication.  Overweight with body mass index (BMI) of 27 to 27.9 in adult Meets criteria with BMI at 29       Body mass index is 29.32 kg/m.         Consultants: Cardiology  Procedures: Echocardiogram with preserved ejection fraction and grade 1 diastolic dysfunction  Antimicrobials: None  Code Status: Full code   Subjective: Patient states for the most part chest pain-free although still having some intermittent chest pain.  Objective: Vital signs were reviewed and unremarkable. Vitals:   11/17/21 0928 11/17/21 1246  BP: 129/87 139/85  Pulse: 89 90  Resp: 18 20  Temp: (!) 97.5 F (36.4 C) 98 F (36.7 C)  SpO2: 99% 98%    Intake/Output Summary (Last 24 hours) at 11/17/2021 1335 Last data filed at 11/17/2021 0801 Gross per 24 hour  Intake 351.06 ml  Output 100 ml  Net 251.06 ml   Filed Weights   11/16/21 1020 11/17/21 0525  Weight: 101.2 kg 100.8 kg   Body mass index is 29.32 kg/m.  Exam:  General: Alert and oriented x3, no acute distress HEENT: Normocephalic, atraumatic, mucous membranes are moist Cardiovascular: Regular rate and rhythm, S1-S2, 2 out of 6 systolic ejection murmur Respiratory: Clear to auscultation bilaterally Abdomen: Soft, nontender, nondistended, positive bowel sounds Musculoskeletal: No clubbing or cyanosis or edema  Skin: No skin breaks, tears or lesions Psychiatry: Appropriate, no evidence of psychoses Neurology: No focal deficits  Data Reviewed: Lipid panel noting high triglycerides and low HDL plus LDL of 93.  BNP of 23.6.  Disposition:  Status is: Observation     Anticipated discharge date: 9/26  Remaining issues to be resolved so that patient can be discharged:  -Cardiac catheterization with possible stent placement   Family Communication: Declined for me to call anyone DVT  Prophylaxis:   Heparin infusion    Author: Annita Brod ,MD 11/17/2021 1:35 PM  To reach On-call, see care teams to locate the attending and reach out via [email or web address]. Between 7PM-7AM, please contact night-coverage If you still have difficulty reaching the attending provider,  please page the [facility] (Director on Call) for [facility] on amion for assistance.

## 2021-11-17 NOTE — Progress Notes (Signed)
[place] for initiation and monitoring of heparin infusion Indication: chest pain/ACS  No Known Allergies  Patient Measurements: Height: 6\' 1"  (185.4 cm) Weight: 101.2 kg (223 lb) IBW/kg (Calculated) : 79.9 Heparin Dosing Weight: 101.2 kg  Vital Signs: Temp: 98.2 F (36.8 C) (09/24 0040) Temp Source: Oral (09/23 1942) BP: 139/80 (09/24 0040) Pulse Rate: 89 (09/24 0040)  Labs: Recent Labs    11/16/21 0930 11/16/21 1137 11/16/21 1822 11/17/21 0043  HGB 15.2  --   --  14.3  HCT 44.3  --   --  41.5  PLT 319  --   --  309  APTT  --  28  --   --   LABPROT  --  13.0  --   --   INR  --  1.0  --   --   HEPARINUNFRC  --   --  0.44 0.39  CREATININE 1.18  --   --   --   TROPONINIHS 21* 29*  --   --      Estimated Creatinine Clearance: 96.8 mL/min (by C-G formula based on SCr of 1.18 mg/dL).   Assessment: 46 year old male with PHM of CAD, hypertension, MI with stent  presented with chest pain. A review of dispense records reveals no chronic anticoagulation prior to arrival  9/23 1822   HL 0.44, therapeutic x 1 9/24 0043  HL 0.39, therapeutic x 2  Goal of Therapy:  Heparin level 0.3-0.7 units/ml Monitor platelets by anticoagulation protocol: Yes   Plan:  Continue with current rate of 1600 units/hr Check HL daily w/ AM labs while therapeutic Daily CBC while on Heparin drip  Renda Rolls, PharmD, [facility] 11/17/2021 1:28 AM

## 2021-11-17 NOTE — Assessment & Plan Note (Signed)
Counseled on the dangers of missing his medication.

## 2021-11-17 NOTE — Progress Notes (Signed)
Cardiology Progress Note  Patient ID: Logan Crawford MRN: [id number] DOB: 1976-01-11 Date of Encounter: 11/17/2021  Primary Cardiologist: Kate Sable, MD  Subjective   Chief Complaint: Chest pain  HPI: Intermittent chest pain last night.  Seems to be improved with nitroglycerin.  Troponin is minimally elevated.  Echo was normal.  UDS positive for cocaine.  ROS:  All other ROS reviewed and negative. Pertinent positives noted in the HPI.     Inpatient Medications  Scheduled Meds:  amLODipine  10 mg Oral Daily   aspirin EC  81 mg Oral Daily   atorvastatin  80 mg Oral Daily   carvedilol  6.25 mg Oral BID WC   isosorbide mononitrate  90 mg Oral Daily   pantoprazole  40 mg Oral Daily   ticagrelor  90 mg Oral Q12H   Continuous Infusions:  heparin 1,600 Units/hr (11/17/21 0801)   PRN Meds: acetaminophen, albuterol, morphine injection, nitroGLYCERIN, ondansetron (ZOFRAN) IV   Vital Signs   Vitals:   11/17/21 0040 11/17/21 0413 11/17/21 0525 11/17/21 0928  BP: 139/80 (!) 141/84 121/81 129/87  Pulse: 89 78 85 89  Resp: 16 17 20 18   Temp: 98.2 F (36.8 C) 98.3 F (36.8 C) 98.1 F (36.7 C) (!) 97.5 F (36.4 C)  TempSrc:  Oral Oral Oral  SpO2: 99% 100% 97% 99%  Weight:   100.8 kg   Height:        Intake/Output Summary (Last 24 hours) at 11/17/2021 1035 Last data filed at 11/17/2021 0801 Gross per 24 hour  Intake 351.06 ml  Output 100 ml  Net 251.06 ml      11/17/2021    5:25 AM 11/16/2021   10:20 AM 06/27/2021    3:39 PM  Last 3 Weights  Weight (lbs) 222 lb 3.2 oz 223 lb 219 lb  Weight (kg) 100.789 kg 101.152 kg 99.338 kg      Telemetry  Overnight telemetry shows sinus rhythm in the 90s, which I personally reviewed.   ECG  The most recent ECG shows sinus tachycardia heart rate 105, old anteroseptal infarct, inferior infarct, no acute ischemic changes, unchanged from prior, which I personally reviewed.   Physical Exam   Vitals:   11/17/21 0040 11/17/21  0413 11/17/21 0525 11/17/21 0928  BP: 139/80 (!) 141/84 121/81 129/87  Pulse: 89 78 85 89  Resp: 16 17 20 18   Temp: 98.2 F (36.8 C) 98.3 F (36.8 C) 98.1 F (36.7 C) (!) 97.5 F (36.4 C)  TempSrc:  Oral Oral Oral  SpO2: 99% 100% 97% 99%  Weight:   100.8 kg   Height:        Intake/Output Summary (Last 24 hours) at 11/17/2021 1035 Last data filed at 11/17/2021 0801 Gross per 24 hour  Intake 351.06 ml  Output 100 ml  Net 251.06 ml       11/17/2021    5:25 AM 11/16/2021   10:20 AM 06/27/2021    3:39 PM  Last 3 Weights  Weight (lbs) 222 lb 3.2 oz 223 lb 219 lb  Weight (kg) 100.789 kg 101.152 kg 99.338 kg    Body mass index is 29.32 kg/m.  General: Well nourished, well developed, in no acute distress Head: Atraumatic, normal size  Eyes: PEERLA, EOMI  Neck: Supple, no JVD Endocrine: No thryomegaly Cardiac: Normal S1, S2; RRR; no murmurs, rubs, or gallops Lungs: Clear to auscultation bilaterally, no wheezing, rhonchi or rales  Abd: Soft, nontender, no hepatomegaly  Ext: No edema, pulses 2+ Musculoskeletal:  No deformities, BUE and BLE strength normal and equal Skin: Warm and dry, no rashes   Neuro: Alert and oriented to person, place, time, and situation, CNII-XII grossly intact, no focal deficits  Psych: Normal mood and affect   Labs  High Sensitivity Troponin:   Recent Labs  Lab 11/16/21 0930 11/16/21 1137  TROPONINIHS 21* 29*     Cardiac EnzymesNo results for input(s): "TROPONINI" in the last 168 hours. No results for input(s): "TROPIPOC" in the last 168 hours.  Chemistry Recent Labs  Lab 11/16/21 0930  NA 140  K 3.9  CL 110  CO2 22  GLUCOSE 114*  BUN 17  CREATININE 1.18  CALCIUM 9.2  PROT 7.0  ALBUMIN 3.5  AST 29  ALT 20  ALKPHOS 79  BILITOT 0.6  GFRNONAA >60  ANIONGAP 8    Hematology Recent Labs  Lab 11/16/21 0930 11/17/21 0043  WBC 8.2 7.4  RBC 5.15 4.79  HGB 15.2 14.3  HCT 44.3 41.5  MCV 86.0 86.6  MCH 29.5 29.9  MCHC 34.3 34.5  RDW 13.7  14.0  PLT 319 309   BNP Recent Labs  Lab 11/17/21 0043  BNP 23.6    DDimer No results for input(s): "DDIMER" in the last 168 hours.   Radiology  ECHOCARDIOGRAM COMPLETE  Result Date: 11/16/2021    ECHOCARDIOGRAM REPORT   Patient Name:   Logan Crawford Date of Exam: 11/16/2021 Medical Rec #:  [id number]        Height:       73.0 in Accession #:    [id number]       Weight:       223.0 lb Date of Birth:  [birth year]       BSA:          2.254 m Patient Age:    46 years         BP:           139/92 mmHg Patient Gender: M                HR:           85 bpm. Exam Location:  [facility] Procedure: 2D Echo, Strain Analysis and 3D Echo Indications:     NSTEMI I21.4  History:         Patient has prior history of Echocardiogram examinations, most                  recent 07/16/2021.  Sonographer:     Kathlen Brunswick RDCS Referring Phys:  [id number] [place] Diagnosing Phys: Eleonore Chiquito MD  Sonographer Comments: Global longitudinal strain was attempted. IMPRESSIONS  1. Left ventricular ejection fraction, by estimation, is 60 to 65%. The left ventricle has normal function. The left ventricle has no regional wall motion abnormalities. There is moderate concentric left ventricular hypertrophy. Left ventricular diastolic parameters are consistent with Grade I diastolic dysfunction (impaired relaxation).  2. Right ventricular systolic function is normal. The right ventricular size is normal. Tricuspid regurgitation signal is inadequate for assessing PA pressure.  3. The mitral valve is grossly normal. Trivial mitral valve regurgitation. No evidence of mitral stenosis.  4. The aortic valve is tricuspid. There is mild calcification of the aortic valve. Aortic valve regurgitation is not visualized. Aortic valve sclerosis is present, with no evidence of aortic valve stenosis.  5. The inferior vena cava is normal in size with greater than 50% respiratory variability, suggesting right atrial pressure of 3 mmHg.  Comparison(s): No  significant change from prior study. FINDINGS  Left Ventricle: Left ventricular ejection fraction, by estimation, is 60 to 65%. The left ventricle has normal function. The left ventricle has no regional wall motion abnormalities. Global longitudinal strain performed but not reported based on interpreter judgement due to suboptimal tracking. The left ventricular internal cavity size was normal in size. There is moderate concentric left ventricular hypertrophy. Left ventricular diastolic parameters are consistent with Grade I diastolic dysfunction (impaired relaxation). Right Ventricle: The right ventricular size is normal. No increase in right ventricular wall thickness. Right ventricular systolic function is normal. Tricuspid regurgitation signal is inadequate for assessing PA pressure. Left Atrium: Left atrial size was normal in size. Right Atrium: Right atrial size was normal in size. Pericardium: There is no evidence of pericardial effusion. Mitral Valve: The mitral valve is grossly normal. Trivial mitral valve regurgitation. No evidence of mitral valve stenosis. Tricuspid Valve: The tricuspid valve is grossly normal. Tricuspid valve regurgitation is not demonstrated. No evidence of tricuspid stenosis. Aortic Valve: The aortic valve is tricuspid. There is mild calcification of the aortic valve. Aortic valve regurgitation is not visualized. Aortic valve sclerosis is present, with no evidence of aortic valve stenosis. Aortic valve peak gradient measures 9.4 mmHg. Pulmonic Valve: The pulmonic valve was grossly normal. Pulmonic valve regurgitation is not visualized. No evidence of pulmonic stenosis. Aorta: The aortic root and ascending aorta are structurally normal, with no evidence of dilitation. Venous: The right lower pulmonary vein is normal. The inferior vena cava is normal in size with greater than 50% respiratory variability, suggesting right atrial pressure of 3 mmHg. IAS/Shunts: The atrial  septum is grossly normal.  LEFT VENTRICLE PLAX 2D LVIDd:         3.80 cm     Diastology LVIDs:         2.50 cm     LV e' medial:    5.50 cm/s LV PW:         1.30 cm     LV E/e' medial:  11.9 LV IVS:        1.50 cm     LV e' lateral:   6.35 cm/s LVOT diam:     2.00 cm     LV E/e' lateral: 10.3 LV SV:         66 LV SV Index:   29 LVOT Area:     3.14 cm                             3D Volume EF: LV Volumes (MOD)           3D EF:        55 % LV vol d, MOD A2C: 64.4 ml LV EDV:       96 ml LV vol d, MOD A4C: 62.1 ml LV ESV:       43 ml LV vol s, MOD A2C: 27.6 ml LV SV:        52 ml LV vol s, MOD A4C: 23.4 ml LV SV MOD A2C:     36.8 ml LV SV MOD A4C:     62.1 ml LV SV MOD BP:      37.5 ml RIGHT VENTRICLE RV Basal diam:  2.90 cm RV S prime:     15.30 cm/s TAPSE (M-mode): 1.8 cm LEFT ATRIUM             Index        RIGHT  ATRIUM           Index LA diam:        3.30 cm 1.46 cm/m   RA Area:     11.30 cm LA Vol (A2C):   32.3 ml 14.33 ml/m  RA Volume:   23.00 ml  10.21 ml/m LA Vol (A4C):   32.1 ml 14.24 ml/m LA Biplane Vol: 33.0 ml 14.64 ml/m  AORTIC VALVE                 PULMONIC VALVE AV Area (Vmax): 2.39 cm     PV Vmax:       1.41 m/s AV Vmax:        153.67 cm/s  PV Peak grad:  7.9 mmHg AV Peak Grad:   9.4 mmHg LVOT Vmax:      117.00 cm/s LVOT Vmean:     78.900 cm/s LVOT VTI:       0.210 m  AORTA Ao Root diam: 3.50 cm Ao Asc diam:  2.90 cm MITRAL VALVE MV Area (PHT): 4.16 cm    SHUNTS MV Decel Time: 183 msec    Systemic VTI:  0.21 m MV E velocity: 65.25 cm/s  Systemic Diam: 2.00 cm MV A velocity: 84.90 cm/s MV E/A ratio:  0.77 Eleonore Chiquito MD Electronically signed by Eleonore Chiquito MD Signature Date/Time: 11/16/2021/3:02:31 PM    Final    DG Chest Portable 1 View  Result Date: 11/16/2021 CLINICAL DATA:  Chest pain beginning last night. Improved with sublingual nitroglycerin. EXAM: PORTABLE CHEST 1 VIEW COMPARISON:  06/27/2021 FINDINGS: The heart size and mediastinal contours are within normal limits. Both lungs are  clear. The visualized skeletal structures are unremarkable. IMPRESSION: No active disease. Electronically Signed   By: Marlaine Hind M.D.   On: 11/16/2021 10:15    Cardiac Studies  TTE 11/16/2021  1. Left ventricular ejection fraction, by estimation, is 60 to 65%. The  left ventricle has normal function. The left ventricle has no regional  wall motion abnormalities. There is moderate concentric left ventricular  hypertrophy. Left ventricular  diastolic parameters are consistent with Grade I diastolic dysfunction  (impaired relaxation).   2. Right ventricular systolic function is normal. The right ventricular  size is normal. Tricuspid regurgitation signal is inadequate for assessing  PA pressure.   3. The mitral valve is grossly normal. Trivial mitral valve  regurgitation. No evidence of mitral stenosis.   4. The aortic valve is tricuspid. There is mild calcification of the  aortic valve. Aortic valve regurgitation is not visualized. Aortic valve  sclerosis is present, with no evidence of aortic valve stenosis.   5. The inferior vena cava is normal in size with greater than 50%  respiratory variability, suggesting right atrial pressure of 3 mmHg.   Patient Profile  Logan Crawford is a 46 y.o. male with CAD status post PCI, hypertension, hyperlipidemia, substance abuse who was admitted on 11/16/2021 for unstable angina.  Assessment & Plan   #Unstable angina #Recent PCI to the mid LAD #Medication noncompliance #Cocaine abuse -History of recent PCI to the mid LAD in March 2023.  Underwent PCI to the more proximal segment of the mid LAD 2021.  He also has diffuse 50% disease in the RCA as well as 60% disease in the second diagonal branch. -Not taking his medications.  Ongoing cocaine use. -Now with symptoms of chest discomfort similar to prior episodes as well as minimally elevated troponins. -EKG unchanged from prior.  Echocardiogram shows normal LV function  with no wall motion  abnormality. -Still having intermittent chest discomfort.  Nitro seems to help. -Tentatively planning for left heart catheterization tomorrow.  N.p.o. at midnight. -Continue aspirin and Brilinta.  Again he is not taking this at home.  He is on a heparin drip.  Currently on carvedilol, amlodipine and Imdur.  If needed could add nitro drip.  Seems comfortable in the room during my examination.  #Hypertension -Continue Coreg, amlodipine, Imdur.  #Hyperlipidemia -Lipitor 80.  Repeat lipid should be drawn.  #Cocaine abuse -Cessation advised    For questions or updates, please contact [place] Please consult [email or web address] for contact info under        Signed, [place] T. Audie Box, MD, [place]  11/17/2021 10:35 AM

## 2021-11-17 NOTE — Assessment & Plan Note (Signed)
Triglycerides of 284 and HDL 29 with LDL 93.  On high-dose Lipitor.

## 2021-11-17 NOTE — Assessment & Plan Note (Signed)
Counseled on drug use.  Urine drug screen positive for cocaine, THC and opiates.

## 2021-11-17 NOTE — Assessment & Plan Note (Signed)
Meets criteria with BMI at 29

## 2021-11-18 ENCOUNTER — Encounter
Admission: EM | Disposition: A | Discharge status: Home / Self Care | Payer: Self-pay | Source: Home / Self Care | Attending: Osteopathic Medicine

## 2021-11-18 DIAGNOSIS — F1410 Cocaine abuse, uncomplicated: Secondary | ICD-10-CM

## 2021-11-18 DIAGNOSIS — I25110 Atherosclerotic heart disease of native coronary artery with unstable angina pectoris: Secondary | ICD-10-CM

## 2021-11-18 HISTORY — PX: LEFT HEART CATH AND CORONARY ANGIOGRAPHY: CATH118249

## 2021-11-18 LAB — CBC
HCT: 42.3 % (ref 39.0–52.0)
Hemoglobin: 14.7 g/dL (ref 13.0–17.0)
MCH: 29.8 pg (ref 26.0–34.0)
MCHC: 34.8 g/dL (ref 30.0–36.0)
MCV: 85.8 fL (ref 80.0–100.0)
Platelets: 313 10*3/uL (ref 150–400)
RBC: 4.93 MIL/uL (ref 4.22–5.81)
RDW: 13.7 % (ref 11.5–15.5)
WBC: 8 10*3/uL (ref 4.0–10.5)
nRBC: 0 % (ref 0.0–0.2)

## 2021-11-18 LAB — BASIC METABOLIC PANEL
Anion gap: 8 (ref 5–15)
BUN: 13 mg/dL (ref 6–20)
CO2: 22 mmol/L (ref 22–32)
Calcium: 9 mg/dL (ref 8.9–10.3)
Chloride: 110 mmol/L (ref 98–111)
Creatinine, Ser: 1.13 mg/dL (ref 0.61–1.24)
GFR, Estimated: >60 mL/min (ref >60)
Glucose, Bld: 88 mg/dL (ref 70–99)
Potassium: 4 mmol/L (ref 3.5–5.1)
Sodium: 140 mmol/L (ref 135–145)

## 2021-11-18 LAB — HEPARIN LEVEL (UNFRACTIONATED): Heparin Unfractionated: 0.51 IU/mL (ref 0.30–0.70)

## 2021-11-18 LAB — LIPOPROTEIN A (LPA): Lipoprotein (a): 50 nmol/L — ABNORMAL HIGH (ref <75.0)

## 2021-11-18 SURGERY — LEFT HEART CATH AND CORONARY ANGIOGRAPHY
Anesthesia: Moderate Sedation

## 2021-11-18 MED ORDER — SODIUM CHLORIDE 0.9 % IV SOLN: 250.0000 mL | INTRAVENOUS | Status: DC | PRN

## 2021-11-18 MED ORDER — SODIUM CHLORIDE 0.9% FLUSH: 3.0000 mL | INTRAVENOUS | Status: DC | PRN

## 2021-11-18 MED ORDER — ASPIRIN 81 MG PO CHEW: 81.0000 mg | CHEWABLE_TABLET | ORAL | Status: DC

## 2021-11-18 MED ORDER — HEPARIN SODIUM (PORCINE) 1000 UNIT/ML IJ SOLN
INTRAMUSCULAR | Status: AC
  Filled 2021-11-18: qty 10

## 2021-11-18 MED ORDER — MIDAZOLAM HCL 2 MG/2ML IJ SOLN
INTRAMUSCULAR | Status: DC | PRN
  Administered 2021-11-18: 2 mg via INTRAVENOUS

## 2021-11-18 MED ORDER — LIDOCAINE HCL 1 % IJ SOLN
INTRAMUSCULAR | Status: AC
  Filled 2021-11-18: qty 20

## 2021-11-18 MED ORDER — SODIUM CHLORIDE 0.9% FLUSH: 3.0000 mL | Freq: Two times a day (BID) | INTRAVENOUS | Status: DC

## 2021-11-18 MED ORDER — MIDAZOLAM HCL 2 MG/2ML IJ SOLN
INTRAMUSCULAR | Status: AC
  Filled 2021-11-18: qty 2

## 2021-11-18 MED ORDER — HEPARIN (PORCINE) IN NACL 1000-0.9 UT/500ML-% IV SOLN
INTRAVENOUS | Status: DC | PRN
  Administered 2021-11-18: 1000 mL

## 2021-11-18 MED ORDER — SODIUM CHLORIDE 0.9 % IV SOLN: INTRAVENOUS | Status: DC

## 2021-11-18 MED ORDER — LIDOCAINE HCL (PF) 1 % IJ SOLN
INTRAMUSCULAR | Status: DC | PRN
  Administered 2021-11-18: 20 mL via SUBCUTANEOUS

## 2021-11-18 MED ORDER — IOHEXOL 300 MG/ML  SOLN
INTRAMUSCULAR | Status: DC | PRN
  Administered 2021-11-18: 72 mL

## 2021-11-18 MED ORDER — ASPIRIN 81 MG PO TBEC
81.0000 mg | DELAYED_RELEASE_TABLET | Freq: Every day | ORAL | Status: DC
  Administered 2021-11-19: 81 mg via ORAL
  Filled 2021-11-18: qty 1

## 2021-11-18 MED ORDER — NITROGLYCERIN 1 MG/10 ML FOR IR/CATH LAB
INTRA_ARTERIAL | Status: DC | PRN
  Administered 2021-11-18: 200 ug via INTRACORONARY

## 2021-11-18 MED ORDER — HEPARIN (PORCINE) IN NACL 1000-0.9 UT/500ML-% IV SOLN
INTRAVENOUS | Status: AC
  Filled 2021-11-18: qty 500

## 2021-11-18 MED ORDER — ORAL CARE MOUTH RINSE: 15.0000 mL | OROMUCOSAL | Status: DC | PRN

## 2021-11-18 MED ORDER — SODIUM CHLORIDE 0.9 % IV SOLN: INTRAVENOUS | Status: AC

## 2021-11-18 MED ORDER — FENTANYL CITRATE (PF) 100 MCG/2ML IJ SOLN
INTRAMUSCULAR | Status: AC
  Filled 2021-11-18: qty 2

## 2021-11-18 MED ORDER — HEPARIN (PORCINE) 25000 UT/250ML-% IV SOLN
INTRAVENOUS | Status: AC
  Filled 2021-11-18: qty 250

## 2021-11-18 SURGICAL SUPPLY — 10 items
CANNULA 5F STIFF (CANNULA) IMPLANT
CATH INFINITI 5FR MULTPACK ANG (CATHETERS) IMPLANT
DEVICE CLOSURE MYNXGRIP 5F (Vascular Products) IMPLANT
DRAPE BRACHIAL (DRAPES) IMPLANT
PACK CARDIAC CATH (CUSTOM PROCEDURE TRAY) ×1 IMPLANT
PROTECTION STATION PRESSURIZED (MISCELLANEOUS) ×1
SET ATX SIMPLICITY (MISCELLANEOUS) IMPLANT
SHEATH AVANTI 5FR X 11CM (SHEATH) IMPLANT
STATION PROTECTION PRESSURIZED (MISCELLANEOUS) IMPLANT
WIRE GUIDERIGHT .035X150 (WIRE) IMPLANT

## 2021-11-18 NOTE — Progress Notes (Signed)
PROGRESS NOTE    Logan Crawford   ZOX:[id number] DOB: 04/20/1975  DOA: 11/16/2021 Date of Service: 11/18/21 PCP: Oneita Hurt, No     Brief Narrative / Hospital Course:  46 year old male past medical history of CAD status post angioplasty and stent to mid LAD in March 2023 as well as hypertension and tobacco abuse who presents to the emergency room on 9/23 with complaints of chest pain.  Specifically, patient has had intermittent chest pain for the past month and on the morning of presentation, woke up with sharp anterior pressure-like pain which lasted for about 15 minutes and resolved after nitro spray.  Patient reports only taking his medicine including DAPT every other day and smokes a 2 or 3 cigarettes daily as well as uses cocaine.  EKG noteworthy for inferior and anterior Q waves.  Echocardiogram noteworthy for grade 1 diastolic dysfunction. Patient started on heparin with plan for cardiac catheterization on Monday, 9/25.     Consultants:  cardiology  Procedures: 11/18/21 cardiac catheterization       ASSESSMENT & PLAN:   Active Problems:   NSTEMI (non-ST elevated myocardial infarction) (HCC)   Polysubstance abuse (HCC)   Essential hypertension   Other hyperlipidemia   Tobacco use   Noncompliance   Overweight with body mass index (BMI) of 27 to 27.9 in adult  NSTEMI (non-ST elevated myocardial infarction) (HCC) History of PCI in March 2023.  Also with diffuse disease and has been noncompliant with medication plus ongoing drug use.  Ejection fraction unremarkable on echocardiogram.  On aspirin and Brilinta and heparin drip. Cardiac cath 11/18/21 significant 2 vessel coronary disease, cardiology to discuss options for further management   Polysubstance abuse (HCC) Counseled on drug use.  Urine drug screen positive for cocaine, THC and opiates.  Noncompliance Counseled on the dangers of missing his medication.  Overweight with body mass index (BMI) of 27 to 27.9 in  adult Meets criteria with BMI at 29  Tobacco use Counseled.  At this time, declines nicotine patch  Essential hypertension Restarted on medications.  Blood pressure remained stable.  Other hyperlipidemia Triglycerides of 284 and HDL 29 with LDL 93.  On high-dose Lipitor.    DVT prophylaxis: heparin infusion  Pertinent IV fluids/nutrition: none, tolerating po  Central lines / invasive devices: none  Code Status: FULL Family Communication: none at this time   Disposition: inpatient  TOC needs: none at this time Barriers to discharge / significant pending items: significant 2 vessel coronary disease, cardiology to discuss options for further management              Subjective:  Patient reports chest pain is resolved, he's hungry prior to procedure.        Objective:  Vitals:   11/18/21 1330 11/18/21 1345 11/18/21 1400 11/18/21 1438  BP: (!) 158/118 (!) 143/107 (!) 160/105 (!) 150/101  Pulse: 91 86 85 81  Resp: 15 13 15 17   Temp:    97.7 F (36.5 C)  TempSrc:    Oral  SpO2: 98% 96% 99% 96%  Weight:      Height:        Intake/Output Summary (Last 24 hours) at 11/18/2021 1536 Last data filed at 11/18/2021 1340 Gross per 24 hour  Intake 396.37 ml  Output 625 ml  Net -228.63 ml   Filed Weights   11/17/21 0525 11/18/21 0400 11/18/21 1127  Weight: 100.8 kg 100.6 kg 99.8 kg    Examination:  Constitutional:  VS as above General Appearance: alert,  well-developed, well-nourished, NAD Eyes: Normal lids and conjunctive, non-icteric sclera Ears, Nose, Mouth, Throat: Normal external appearance MMM Neck: No masses, trachea midline Respiratory: Normal respiratory effort No wheeze No rhonchi No rales Cardiovascular: S1/S2 normal No murmur No rub/gallop auscultated No lower extremity edema Gastrointestinal: No tenderness Musculoskeletal:  No clubbing/cyanosis of digits Symmetrical movement in all extremities Neurological: No cranial nerve  deficit on limited exam Alert Psychiatric: Normal judgment/insight Normal mood and affect       Scheduled Medications:   amLODipine  10 mg Oral Daily   [START ON 11/19/2021] aspirin EC  81 mg Oral Daily   atorvastatin  80 mg Oral Daily   carvedilol  6.25 mg Oral BID WC   isosorbide mononitrate  90 mg Oral Daily   pantoprazole  40 mg Oral Daily   sodium chloride flush  3 mL Intravenous Q12H   ticagrelor  90 mg Oral Q12H    Continuous Infusions:  sodium chloride 100 mL/hr at 11/18/21 1342   sodium chloride      PRN Medications:  sodium chloride, acetaminophen, albuterol, morphine injection, nitroGLYCERIN, ondansetron (ZOFRAN) IV, mouth rinse, sodium chloride flush  Antimicrobials:  Anti-infectives (From admission, onward)    None       Data Reviewed: I have personally reviewed following labs and imaging studies  CBC: Recent Labs  Lab 11/16/21 0930 11/17/21 0043 11/18/21 0625  WBC 8.2 7.4 8.0  NEUTROABS 5.0  --   --   HGB 15.2 14.3 14.7  HCT 44.3 41.5 42.3  MCV 86.0 86.6 85.8  PLT 319 309 [id number]   Basic Metabolic Panel: Recent Labs  Lab 11/16/21 0930 11/18/21 0625  NA 140 140  K 3.9 4.0  CL 110 110  CO2 22 22  GLUCOSE 114* 88  BUN 17 13  CREATININE 1.18 1.13  CALCIUM 9.2 9.0   GFR: Estimated Creatinine Clearance: 98.9 mL/min (by C-G formula based on SCr of 1.13 mg/dL). Liver Function Tests: Recent Labs  Lab 11/16/21 0930  AST 29  ALT 20  ALKPHOS 79  BILITOT 0.6  PROT 7.0  ALBUMIN 3.5   No results for input(s): "LIPASE", "AMYLASE" in the last 168 hours. No results for input(s): "AMMONIA" in the last 168 hours. Coagulation Profile: Recent Labs  Lab 11/16/21 1137  INR 1.0   Cardiac Enzymes: No results for input(s): "CKTOTAL", "CKMB", "CKMBINDEX", "TROPONINI" in the last 168 hours. BNP (last 3 results) No results for input(s): "PROBNP" in the last 8760 hours. HbA1C: No results for input(s): "HGBA1C" in the last 72 hours. CBG: No  results for input(s): "GLUCAP" in the last 168 hours. Lipid Profile: Recent Labs    11/17/21 1216  CHOL 179  HDL 29*  LDLCALC 93  TRIG 284*  CHOLHDL 6.2   Thyroid Function Tests: No results for input(s): "TSH", "T4TOTAL", "FREET4", "T3FREE", "THYROIDAB" in the last 72 hours. Anemia Panel: No results for input(s): "VITAMINB12", "FOLATE", "FERRITIN", "TIBC", "IRON", "RETICCTPCT" in the last 72 hours. Urine analysis:    Component Value Date/Time   COLORURINE YELLOW (A) 11/25/2019 1759   APPEARANCEUR CLEAR (A) 11/25/2019 1759   APPEARANCEUR Clear 06/30/2013 1513   LABSPEC 1.018 11/25/2019 1759   LABSPEC 1.015 06/30/2013 1513   PHURINE 5.0 11/25/2019 1759   GLUCOSEU NEGATIVE 11/25/2019 1759   GLUCOSEU Negative 06/30/2013 1513   HGBUR SMALL (A) 11/25/2019 1759   BILIRUBINUR NEGATIVE 11/25/2019 1759   BILIRUBINUR Negative 06/30/2013 1513   KETONESUR NEGATIVE 11/25/2019 1759   PROTEINUR 30 (A) 11/25/2019 1759   NITRITE  NEGATIVE 11/25/2019 1759   LEUKOCYTESUR NEGATIVE 11/25/2019 1759   LEUKOCYTESUR Negative 06/30/2013 1513   Sepsis Labs: @LABRCNTIP (procalcitonin:4,lacticidven:4)  Recent Results (from the past 240 hour(s))  SARS Coronavirus 2 by RT PCR (hospital order, performed in [facility] lab) *cepheid single result test* Anterior Nasal Swab     Status: None   Collection Time: 11/16/21 10:15 AM   Specimen: Anterior Nasal Swab  Result Value Ref Range Status   SARS Coronavirus 2 by RT PCR NEGATIVE NEGATIVE Final    Comment: (NOTE) SARS-CoV-2 target nucleic acids are NOT DETECTED.  The SARS-CoV-2 RNA is generally detectable in upper and lower respiratory specimens during the acute phase of infection. The lowest concentration of SARS-CoV-2 viral copies this assay can detect is 250 copies / mL. A negative result does not preclude SARS-CoV-2 infection and should not be used as the sole basis for treatment or other patient management decisions.  A negative result may  occur with improper specimen collection / handling, submission of specimen other than nasopharyngeal swab, presence of viral mutation(s) within the areas targeted by this assay, and inadequate number of viral copies (<250 copies / mL). A negative result must be combined with clinical observations, patient history, and epidemiological information.  Fact Sheet for Patients:   https://www.patel.info/  Fact Sheet for Healthcare Providers: https://hall.com/  This test is not yet approved or  cleared by the [place] FDA and has been authorized for detection and/or diagnosis of SARS-CoV-2 by FDA under an Emergency Use Authorization (EUA).  This EUA will remain in effect (meaning this test can be used) for the duration of the COVID-19 declaration under Section 564(b)(1) of the Act, 21 U.S.C. section 360bbb-3(b)(1), unless the authorization is terminated or revoked sooner.  Performed at [facility], [address], [place], [place] [address]          Radiology Studies: ECHOCARDIOGRAM COMPLETE  Result Date: 11/16/2021    ECHOCARDIOGRAM REPORT   Patient Name:   JAYMARI MANCERA Date of Exam: 11/16/2021 Medical Rec #:  [id number]        Height:       73.0 in Accession #:    [id number]       Weight:       223.0 lb Date of Birth:  Nov 02, 1975       BSA:          2.254 m Patient Age:    60 years         BP:           139/92 mmHg Patient Gender: M                HR:           85 bpm. Exam Location:  [facility] Procedure: 2D Echo, Strain Analysis and 3D Echo Indications:     NSTEMI I21.4  History:         Patient has prior history of Echocardiogram examinations, most                  recent 07/16/2021.  Sonographer:     Kathlen Brunswick RDCS Referring Phys:  [id number] [place] Diagnosing Phys: Eleonore Chiquito MD  Sonographer Comments: Global longitudinal strain was attempted. IMPRESSIONS  1. Left ventricular ejection fraction, by estimation, is  60 to 65%. The left ventricle has normal function. The left ventricle has no regional wall motion abnormalities. There is moderate concentric left ventricular hypertrophy. Left ventricular diastolic parameters are consistent with Grade I  diastolic dysfunction (impaired relaxation).  2. Right ventricular systolic function is normal. The right ventricular size is normal. Tricuspid regurgitation signal is inadequate for assessing PA pressure.  3. The mitral valve is grossly normal. Trivial mitral valve regurgitation. No evidence of mitral stenosis.  4. The aortic valve is tricuspid. There is mild calcification of the aortic valve. Aortic valve regurgitation is not visualized. Aortic valve sclerosis is present, with no evidence of aortic valve stenosis.  5. The inferior vena cava is normal in size with greater than 50% respiratory variability, suggesting right atrial pressure of 3 mmHg. Comparison(s): No significant change from prior study. FINDINGS  Left Ventricle: Left ventricular ejection fraction, by estimation, is 60 to 65%. The left ventricle has normal function. The left ventricle has no regional wall motion abnormalities. Global longitudinal strain performed but not reported based on interpreter judgement due to suboptimal tracking. The left ventricular internal cavity size was normal in size. There is moderate concentric left ventricular hypertrophy. Left ventricular diastolic parameters are consistent with Grade I diastolic dysfunction (impaired relaxation). Right Ventricle: The right ventricular size is normal. No increase in right ventricular wall thickness. Right ventricular systolic function is normal. Tricuspid regurgitation signal is inadequate for assessing PA pressure. Left Atrium: Left atrial size was normal in size. Right Atrium: Right atrial size was normal in size. Pericardium: There is no evidence of pericardial effusion. Mitral Valve: The mitral valve is grossly normal. Trivial mitral valve  regurgitation. No evidence of mitral valve stenosis. Tricuspid Valve: The tricuspid valve is grossly normal. Tricuspid valve regurgitation is not demonstrated. No evidence of tricuspid stenosis. Aortic Valve: The aortic valve is tricuspid. There is mild calcification of the aortic valve. Aortic valve regurgitation is not visualized. Aortic valve sclerosis is present, with no evidence of aortic valve stenosis. Aortic valve peak gradient measures 9.4 mmHg. Pulmonic Valve: The pulmonic valve was grossly normal. Pulmonic valve regurgitation is not visualized. No evidence of pulmonic stenosis. Aorta: The aortic root and ascending aorta are structurally normal, with no evidence of dilitation. Venous: The right lower pulmonary vein is normal. The inferior vena cava is normal in size with greater than 50% respiratory variability, suggesting right atrial pressure of 3 mmHg. IAS/Shunts: The atrial septum is grossly normal.  LEFT VENTRICLE PLAX 2D LVIDd:         3.80 cm     Diastology LVIDs:         2.50 cm     LV e' medial:    5.50 cm/s LV PW:         1.30 cm     LV E/e' medial:  11.9 LV IVS:        1.50 cm     LV e' lateral:   6.35 cm/s LVOT diam:     2.00 cm     LV E/e' lateral: 10.3 LV SV:         66 LV SV Index:   29 LVOT Area:     3.14 cm                             3D Volume EF: LV Volumes (MOD)           3D EF:        55 % LV vol d, MOD A2C: 64.4 ml LV EDV:       96 ml LV vol d, MOD A4C: 62.1 ml LV ESV:  43 ml LV vol s, MOD A2C: 27.6 ml LV SV:        52 ml LV vol s, MOD A4C: 23.4 ml LV SV MOD A2C:     36.8 ml LV SV MOD A4C:     62.1 ml LV SV MOD BP:      37.5 ml RIGHT VENTRICLE RV Basal diam:  2.90 cm RV S prime:     15.30 cm/s TAPSE (M-mode): 1.8 cm LEFT ATRIUM             Index        RIGHT ATRIUM           Index LA diam:        3.30 cm 1.46 cm/m   RA Area:     11.30 cm LA Vol (A2C):   32.3 ml 14.33 ml/m  RA Volume:   23.00 ml  10.21 ml/m LA Vol (A4C):   32.1 ml 14.24 ml/m LA Biplane Vol: 33.0 ml 14.64  ml/m  AORTIC VALVE                 PULMONIC VALVE AV Area (Vmax): 2.39 cm     PV Vmax:       1.41 m/s AV Vmax:        153.67 cm/s  PV Peak grad:  7.9 mmHg AV Peak Grad:   9.4 mmHg LVOT Vmax:      117.00 cm/s LVOT Vmean:     78.900 cm/s LVOT VTI:       0.210 m  AORTA Ao Root diam: 3.50 cm Ao Asc diam:  2.90 cm MITRAL VALVE MV Area (PHT): 4.16 cm    SHUNTS MV Decel Time: 183 msec    Systemic VTI:  0.21 m MV E velocity: 65.25 cm/s  Systemic Diam: 2.00 cm MV A velocity: 84.90 cm/s MV E/A ratio:  0.77 Eleonore Chiquito MD Electronically signed by Eleonore Chiquito MD Signature Date/Time: 11/16/2021/3:02:31 PM    Final    DG Chest Portable 1 View  Result Date: 11/16/2021 CLINICAL DATA:  Chest pain beginning last night. Improved with sublingual nitroglycerin. EXAM: PORTABLE CHEST 1 VIEW COMPARISON:  06/27/2021 FINDINGS: The heart size and mediastinal contours are within normal limits. Both lungs are clear. The visualized skeletal structures are unremarkable. IMPRESSION: No active disease. Electronically Signed   By: Marlaine Hind M.D.   On: 11/16/2021 10:15            LOS: 0 days      Emeterio Reeve, DO [facility] 11/18/2021, 3:36 PM   Staff may message me via secure chat in [place]  but this may not receive immediate response,  please page for urgent matters!  If 7PM-7AM, please contact night-coverage [email or web address]  Dictation software was used to generate the above note. Typos may occur and escape review, as with typed/written notes. Please contact Dr Sheppard Coil directly for clarity if needed.

## 2021-11-18 NOTE — Interval H&P Note (Signed)
History and Physical Interval Note:  11/18/2021 12:35 PM  Logan Crawford  has presented today for surgery, with the diagnosis of Unstable angina.  The various methods of treatment have been discussed with the patient and family. After consideration of risks, benefits and other options for treatment, the patient has consented to  Procedure(s): LEFT HEART CATH AND CORONARY ANGIOGRAPHY (N/A) as a surgical intervention.  The patient's history has been reviewed, patient examined, no change in status, stable for surgery.  I have reviewed the patient's chart and labs.  Questions were answered to the patient's satisfaction.     [name]

## 2021-11-18 NOTE — TOC Initial Note (Signed)
Transition of Care [facility]) - Initial/Assessment Note    Patient Details  Name: Logan Crawford MRN: [id number] Date of Birth: 1976/01/12  [facility] CM/SW Contact:    Candie Chroman, LCSW Phone Number: 11/18/2021, 4:33 PM  Clinical Narrative:    CSW met with patient. No supports at bedside. CSW introduced role and inquired about not having a PCP or insurance which patient confirmed. Gave packet for free/low-cost healthcare and intake paperwork for [facility]. No further concerns. CSW encouraged patient to contact CSW as needed. CSW will continue to follow patient for support and facilitate return home once stable.              Expected Discharge Plan: Home/Self Care Barriers to Discharge: Continued Medical Work up   Patient Goals and CMS Choice   CMS Medicare.gov Compare Post Acute Care list provided to::  (N/A) Choice offered to / list presented to :  (N/A)  Expected Discharge Plan and Services Expected Discharge Plan: Home/Self Care     Post Acute Care Choice: NA Living arrangements for the past 2 months: Mobile Home                                      Prior Living Arrangements/Services Living arrangements for the past 2 months: Mobile Home   Patient language and need for interpreter reviewed:: Yes              Criminal Activity/Legal Involvement Pertinent to Current Situation/Hospitalization: No - Comment as needed  Activities of Daily Living Home Assistive Devices/Equipment: None ADL Screening (condition at time of admission) Patient's cognitive ability adequate to safely complete daily activities?: Yes Is the patient deaf or have difficulty hearing?: No Does the patient have difficulty seeing, even when wearing glasses/contacts?: No Does the patient have difficulty concentrating, remembering, or making decisions?: No Patient able to express need for assistance with ADLs?: Yes Does the patient have difficulty dressing or bathing?:  No Independently performs ADLs?: Yes (appropriate for developmental age) Does the patient have difficulty walking or climbing stairs?: No Weakness of Legs: None Weakness of Arms/Hands: None  Permission Sought/Granted                  Emotional Assessment Appearance:: Appears stated age Attitude/Demeanor/Rapport: Engaged, Gracious Affect (typically observed): Accepting, Appropriate, Calm, Pleasant Orientation: : Oriented to Self, Oriented to Place, Oriented to  Time, Oriented to Situation Alcohol / Substance Use: Not Applicable Psych Involvement: No (comment)  Admission diagnosis:  Unstable angina (HCC) [I20.0] NSTEMI (non-ST elevated myocardial infarction) ([place]) [I21.4] Patient Active Problem List   Diagnosis Date Noted   Polysubstance abuse ([place]) 11/17/2021   Unstable angina ([place]) 11/16/2021   Noncompliance 11/16/2021   Chest pain 05/18/2021   Hypertensive urgency 05/18/2021   CAD (coronary artery disease) 05/18/2021   Loud snoring 03/28/2020   Gastroesophageal reflux disease without esophagitis    AKI (acute kidney injury) ([place])    NSTEMI (non-ST elevated myocardial infarction) ([place]) 01/24/2020   Tobacco use 11/25/2019   COVID-19 virus infection 11/25/2019   Abnormal EKG 11/25/2019   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

## 2021-11-18 NOTE — Progress Notes (Signed)
Cardiology Progress Note  Patient ID: MARSDEN WALLACE MRN: [id number] DOB: 10-01-1975 Date of Encounter: 11/18/2021  Primary Cardiologist: Kate Sable, MD  Subjective   Chief Complaint: Chest pain  HPI: He underwent cardiac catheterization earlier today which showed 80% proximal in-stent restenosis in the LAD and mild progression of RCA disease to 70%.  However, the whole RCA is diffusely diseased.  ROS:  All other ROS reviewed and negative. Pertinent positives noted in the HPI.     Inpatient Medications  Scheduled Meds:  amLODipine  10 mg Oral Daily   [START ON 11/19/2021] aspirin EC  81 mg Oral Daily   atorvastatin  80 mg Oral Daily   carvedilol  6.25 mg Oral BID WC   isosorbide mononitrate  90 mg Oral Daily   pantoprazole  40 mg Oral Daily   sodium chloride flush  3 mL Intravenous Q12H   ticagrelor  90 mg Oral Q12H   Continuous Infusions:  sodium chloride 100 mL/hr at 11/18/21 1342   sodium chloride     PRN Meds: sodium chloride, acetaminophen, albuterol, morphine injection, nitroGLYCERIN, ondansetron (ZOFRAN) IV, mouth rinse, sodium chloride flush   Vital Signs   Vitals:   11/18/21 1330 11/18/21 1345 11/18/21 1400 11/18/21 1438  BP: (!) 158/118 (!) 143/107 (!) 160/105 (!) 150/101  Pulse: 91 86 85 81  Resp: 15 13 15 17   Temp:    97.7 F (36.5 C)  TempSrc:    Oral  SpO2: 98% 96% 99% 96%  Weight:      Height:        Intake/Output Summary (Last 24 hours) at 11/18/2021 1721 Last data filed at 11/18/2021 1340 Gross per 24 hour  Intake 266.67 ml  Output 625 ml  Net -358.33 ml       11/18/2021   11:27 AM 11/18/2021    4:00 AM 11/17/2021    5:25 AM  Last 3 Weights  Weight (lbs) 220 lb 221 lb 11.2 oz 222 lb 3.2 oz  Weight (kg) 99.791 kg 100.562 kg 100.789 kg      Telemetry  Overnight telemetry shows sinus rhythm in the 90s, which I personally reviewed.   ECG  The most recent ECG shows sinus tachycardia heart rate 105, old anteroseptal infarct,  inferior infarct, no acute ischemic changes, unchanged from prior, which I personally reviewed.   Physical Exam   Vitals:   11/18/21 1330 11/18/21 1345 11/18/21 1400 11/18/21 1438  BP: (!) 158/118 (!) 143/107 (!) 160/105 (!) 150/101  Pulse: 91 86 85 81  Resp: 15 13 15 17   Temp:    97.7 F (36.5 C)  TempSrc:    Oral  SpO2: 98% 96% 99% 96%  Weight:      Height:        Intake/Output Summary (Last 24 hours) at 11/18/2021 1721 Last data filed at 11/18/2021 1340 Gross per 24 hour  Intake 266.67 ml  Output 625 ml  Net -358.33 ml        11/18/2021   11:27 AM 11/18/2021    4:00 AM 11/17/2021    5:25 AM  Last 3 Weights  Weight (lbs) 220 lb 221 lb 11.2 oz 222 lb 3.2 oz  Weight (kg) 99.791 kg 100.562 kg 100.789 kg    Body mass index is 29.84 kg/m.  General: Well nourished, well developed, in no acute distress Head: Atraumatic, normal size  Eyes: PEERLA, EOMI  Neck: Supple, no JVD Endocrine: No thryomegaly Cardiac: Normal S1, S2; RRR; no murmurs, rubs, or gallops  Lungs: Clear to auscultation bilaterally, no wheezing, rhonchi or rales  Abd: Soft, nontender, no hepatomegaly  Ext: No edema, pulses 2+ Musculoskeletal: No deformities, BUE and BLE strength normal and equal Skin: Warm and dry, no rashes   Neuro: Alert and oriented to person, place, time, and situation, CNII-XII grossly intact, no focal deficits  Psych: Normal mood and affect  Right groin: No hematoma.  Labs  High Sensitivity Troponin:   Recent Labs  Lab 11/16/21 0930 11/16/21 1137  TROPONINIHS 21* 29*      Cardiac EnzymesNo results for input(s): "TROPONINI" in the last 168 hours. No results for input(s): "TROPIPOC" in the last 168 hours.  Chemistry Recent Labs  Lab 11/16/21 0930 11/18/21 0625  NA 140 140  K 3.9 4.0  CL 110 110  CO2 22 22  GLUCOSE 114* 88  BUN 17 13  CREATININE 1.18 1.13  CALCIUM 9.2 9.0  PROT 7.0  --   ALBUMIN 3.5  --   AST 29  --   ALT 20  --   ALKPHOS 79  --   BILITOT 0.6  --    GFRNONAA >60 >60  ANIONGAP 8 8     Hematology Recent Labs  Lab 11/16/21 0930 11/17/21 0043 11/18/21 0625  WBC 8.2 7.4 8.0  RBC 5.15 4.79 4.93  HGB 15.2 14.3 14.7  HCT 44.3 41.5 42.3  MCV 86.0 86.6 85.8  MCH 29.5 29.9 29.8  MCHC 34.3 34.5 34.8  RDW 13.7 14.0 13.7  PLT 319 309 313    BNP Recent Labs  Lab 11/17/21 0043  BNP 23.6     DDimer No results for input(s): "DDIMER" in the last 168 hours.   Radiology  CARDIAC CATHETERIZATION  Result Date: 11/18/2021   Ost LM lesion is 30% stenosed.   Dist LAD lesion is 40% stenosed.   Ost RCA to Prox RCA lesion is 50% stenosed.   Prox RCA to Mid RCA lesion is 40% stenosed.   2nd Diag lesion is 30% stenosed.   RPDA lesion is 40% stenosed.   Mid LAD-3 lesion is 60% stenosed.   Mid LAD-1 lesion is 80% stenosed.   Mid RCA lesion is 70% stenosed.   The left ventricular systolic function is normal.   LV end diastolic pressure is mildly elevated.   The left ventricular ejection fraction is 55-65% by visual estimate. 1.  Significant two-vessel coronary artery disease.  Patent overlapped LAD stents but there is evidence of significant in-stent restenosis in the proximal segment.  In addition, there is borderline 60% stenosis distal to the stent.  Known moderate RCA disease but the distal stenosis appears worse. Intracoronary nitroglycerin was given which improved the overall appearance of the coronary arteries. 2.  Normal LV systolic function and moderately elevated left ventricular end-diastolic pressure. Recommendations: Difficult situation overall given the patient's poor compliance with medical therapy and continued cocaine use.  This is a second in-stent restenosis in the LAD.  Management options include CABG or balloon angioplasty of the proximal portion of the LAD stent.  Will discuss options with the patient.    Cardiac Studies  TTE 11/16/2021  1. Left ventricular ejection fraction, by estimation, is 60 to 65%. The  left ventricle has normal  function. The left ventricle has no regional  wall motion abnormalities. There is moderate concentric left ventricular  hypertrophy. Left ventricular  diastolic parameters are consistent with Grade I diastolic dysfunction  (impaired relaxation).   2. Right ventricular systolic function is normal. The right ventricular  size is normal. Tricuspid regurgitation signal is inadequate for assessing  PA pressure.   3. The mitral valve is grossly normal. Trivial mitral valve  regurgitation. No evidence of mitral stenosis.   4. The aortic valve is tricuspid. There is mild calcification of the  aortic valve. Aortic valve regurgitation is not visualized. Aortic valve  sclerosis is present, with no evidence of aortic valve stenosis.   5. The inferior vena cava is normal in size with greater than 50%  respiratory variability, suggesting right atrial pressure of 3 mmHg.   Patient Profile  ALLYSON HOLZHAUSEN is a 46 y.o. male with CAD status post PCI, hypertension, hyperlipidemia, substance abuse who was admitted on 11/16/2021 for unstable angina.  Assessment & Plan   #Unstable angina #Recent PCI to the mid LAD #Medication noncompliance #Cocaine abuse -History of recent PCI to the mid LAD in March 2023.  Underwent PCI to the more proximal segment of the mid LAD 2021.   -Not taking his medications.  Ongoing cocaine use. -Presented with symptoms of chest discomfort similar to prior episodes as well as minimally elevated troponins. -EKG unchanged from prior.  Echocardiogram shows normal LV function with no wall motion abnormality. -I had a prolonged discussion with him about cardiac cath findings.  I discussed different management options including maximizing his antianginal therapy and making sure he is taking his medications before considering further revascularization.  The other option would be to do balloon angioplasty on the proximal portion of the LAD stent.  However, I suspect that the risk of future  restenosis is high and also I am concerned about his compliance with medications.  The third option is CABG. After discussing all these options, the recommendation is to continue maximal antianginal therapy and outpatient follow-up to determine if he is still symptomatic before considering revascularization.  #Hypertension -Continue Coreg, amlodipine, Imdur.  #Hyperlipidemia -Lipitor 80.    #Cocaine abuse -Cessation advised  Disposition: The patient can likely be discharged home tomorrow if he has no further chest pain. He has no health insurance and thus recommend sending prescription to medication management.  He does have Brilinta at home though.    For questions or updates, please contact [place] Please consult [email or web address] for contact info under        Signed, [name], MD [place]  11/18/2021 5:21 PM

## 2021-11-18 NOTE — Progress Notes (Signed)
81 mg ASA given this morning at 0536 per nurse.

## 2021-11-18 NOTE — Progress Notes (Addendum)
Pt eating meal tray and drinking water; pt talking on cell phone

## 2021-11-19 ENCOUNTER — Other Ambulatory Visit: Payer: Self-pay

## 2021-11-19 ENCOUNTER — Encounter: Payer: Self-pay | Admitting: Cardiovascular Disease

## 2021-11-19 LAB — CBC
HCT: 43.9 % (ref 39.0–52.0)
Hemoglobin: 15.3 g/dL (ref 13.0–17.0)
MCH: 29.7 pg (ref 26.0–34.0)
MCHC: 34.9 g/dL (ref 30.0–36.0)
MCV: 85.2 fL (ref 80.0–100.0)
Platelets: 311 10*3/uL (ref 150–400)
RBC: 5.15 MIL/uL (ref 4.22–5.81)
RDW: 13.6 % (ref 11.5–15.5)
WBC: 7.5 10*3/uL (ref 4.0–10.5)
nRBC: 0 % (ref 0.0–0.2)

## 2021-11-19 LAB — BASIC METABOLIC PANEL
Anion gap: 7 (ref 5–15)
BUN: 15 mg/dL (ref 6–20)
CO2: 24 mmol/L (ref 22–32)
Calcium: 8.9 mg/dL (ref 8.9–10.3)
Chloride: 105 mmol/L (ref 98–111)
Creatinine, Ser: 1.16 mg/dL (ref 0.61–1.24)
GFR, Estimated: >60 mL/min (ref >60)
Glucose, Bld: 162 mg/dL — ABNORMAL HIGH (ref 70–99)
Potassium: 3.8 mmol/L (ref 3.5–5.1)
Sodium: 136 mmol/L (ref 135–145)

## 2021-11-19 MED ORDER — CARVEDILOL 6.25 MG PO TABS
6.2500 mg | ORAL_TABLET | Freq: Two times a day (BID) | ORAL | 0 refills | Status: DC
  Filled 2021-11-19: qty 60, 30d supply, fill #0

## 2021-11-19 MED ORDER — ALBUTEROL SULFATE HFA 108 (90 BASE) MCG/ACT IN AERS
2.0000 | INHALATION_SPRAY | Freq: Four times a day (QID) | RESPIRATORY_TRACT | 0 refills | Status: DC | PRN
  Filled 2021-11-19: qty 6.7, 25d supply, fill #0

## 2021-11-19 MED ORDER — LOSARTAN POTASSIUM 50 MG PO TABS
25.0000 mg | ORAL_TABLET | Freq: Every day | ORAL | 0 refills | Status: DC
  Filled 2021-11-19: qty 30, 60d supply, fill #0

## 2021-11-19 MED ORDER — LOSARTAN POTASSIUM 25 MG PO TABS
25.0000 mg | ORAL_TABLET | Freq: Every day | ORAL | Status: DC
  Administered 2021-11-19: 25 mg via ORAL
  Filled 2021-11-19: qty 1

## 2021-11-19 MED ORDER — TICAGRELOR 90 MG PO TABS
90.0000 mg | ORAL_TABLET | Freq: Two times a day (BID) | ORAL | 0 refills | Status: DC
  Filled 2021-11-19 (×2): qty 60, 30d supply, fill #0

## 2021-11-19 MED ORDER — PANTOPRAZOLE SODIUM 40 MG PO TBEC
40.0000 mg | DELAYED_RELEASE_TABLET | Freq: Every day | ORAL | 0 refills | Status: DC
  Filled 2021-11-19: qty 30, 30d supply, fill #0

## 2021-11-19 MED ORDER — ONE-DAILY MULTI VITAMINS PO TABS
1.0000 | ORAL_TABLET | Freq: Every day | ORAL | 0 refills | Status: DC
  Filled 2021-11-19: qty 30, 30d supply, fill #0

## 2021-11-19 MED ORDER — ISOSORBIDE MONONITRATE ER 30 MG PO TB24
90.0000 mg | ORAL_TABLET | Freq: Every day | ORAL | 0 refills | Status: DC
  Filled 2021-11-19: qty 90, 30d supply, fill #0

## 2021-11-19 MED ORDER — ATORVASTATIN CALCIUM 80 MG PO TABS
80.0000 mg | ORAL_TABLET | Freq: Every day | ORAL | 0 refills | Status: DC
  Filled 2021-11-19: qty 30, 30d supply, fill #0

## 2021-11-19 MED ORDER — ASPIRIN 81 MG PO TBEC
81.0000 mg | DELAYED_RELEASE_TABLET | Freq: Every day | ORAL | 0 refills | Status: DC
  Filled 2021-11-19: qty 30, 30d supply, fill #0

## 2021-11-19 MED ORDER — NITROGLYCERIN 0.4 MG SL SUBL
0.4000 mg | SUBLINGUAL_TABLET | SUBLINGUAL | 0 refills | Status: DC | PRN
  Filled 2021-11-19: qty 25, 8d supply, fill #0

## 2021-11-19 MED ORDER — AMLODIPINE BESYLATE 10 MG PO TABS
5.0000 mg | ORAL_TABLET | Freq: Every day | ORAL | 0 refills | Status: DC
  Filled 2021-11-19: qty 30, 60d supply, fill #0

## 2021-11-19 NOTE — TOC Transition Note (Signed)
Transition of Care [facility]) - CM/SW Discharge Note   Patient Details  Name: Logan Crawford MRN: [id number] Date of Birth: 1975/09/18  [facility] of [facility]) CM/SW Contact:  Candie Chroman, LCSW Phone Number: 11/19/2021, 10:02 AM   Clinical Narrative:   Patient has orders to discharge home today. Pharmacist is aware of prescriptions being sent to employee pharmacy. No further concerns. CSW signing off.  Final next level of care: Home/Self Care Barriers to Discharge: Barriers Resolved   Patient Goals and CMS Choice   CMS Medicare.gov Compare Post Acute Care list provided to::  (N/A) Choice offered to / list presented to :  (N/A)  Discharge Placement                    Patient and family notified of of transfer: 11/19/21  Discharge Plan and Services     Post Acute Care Choice: NA                               Social Determinants of Health (SDOH) Interventions     Readmission Risk Interventions     No data to display

## 2021-11-19 NOTE — Discharge Summary (Signed)
Physician Discharge Summary   Patient: Logan Crawford MRN: [id number]  DOB: 1975/03/26   Admit:     Date of Admission: 11/16/2021 Admitted from: home   Discharge: Date of discharge: 11/19/21 Disposition: Home Condition at discharge: good  CODE STATUS: FULL     Discharge Physician: Emeterio Reeve, DO [facility]     PCP: Pcp, No  Recommendations for Outpatient Follow-up:  Establish with PCP in 2-4 weeks Please obtain labs/tests: CBC, BMP in 2-4 weeks Follow as directed w/ cardiology Please follow up on the following pending results: none PCP AND OTHER OUTPATIENT PROVIDERS: SEE BELOW FOR SPECIFIC DISCHARGE INSTRUCTIONS PRINTED FOR PATIENT IN ADDITION TO GENERIC AVS PATIENT INFO     Discharge Instructions     Call MD for:  difficulty breathing, headache or visual disturbances   Complete by: As directed    Call MD for:  severe uncontrolled pain   Complete by: As directed    Diet - low sodium heart healthy   Complete by: As directed    Increase activity slowly   Complete by: As directed          Discharge Diagnoses: Principal Problem:   NSTEMI (non-ST elevated myocardial infarction) ([place]) Active Problems:   Polysubstance abuse ([place])   Essential hypertension   Other hyperlipidemia   Tobacco use   Noncompliance   Overweight with body mass index (BMI) of 27 to 27.9 in adult       Hospital Course: 46 year old male past medical history of CAD status post angioplasty and stent to mid LAD in March 2023 as well as hypertension and tobacco abuse who presents to the emergency room on 9/23 with complaints of chest pain.  Specifically, patient has had intermittent chest pain for the past month and on the morning of presentation, woke up with sharp anterior pressure-like pain which lasted for about 15 minutes and resolved after nitro spray.  Patient reports only taking his medicine including DAPT every other day and smokes a 2 or 3 cigarettes daily as  well as uses cocaine.  EKG noteworthy for inferior and anterior Q waves.  Echocardiogram noteworthy for grade 1 diastolic dysfunction. Patient started on heparin with plan for cardiac catheterization on Monday, 9/25.     Consultants:  cardiology  Procedures: 11/18/21 cardiac catheterization       ASSESSMENT & PLAN:   Active Problems:   NSTEMI (non-ST elevated myocardial infarction) ([place])   Polysubstance abuse ([place])   Essential hypertension   Other hyperlipidemia   Tobacco use   Noncompliance   Overweight with body mass index (BMI) of 27 to 27.9 in adult  NSTEMI (non-ST elevated myocardial infarction) ([place]) History of PCI in March 2023.  Also with diffuse disease and has been noncompliant with medication plus ongoing drug use.  Ejection fraction unremarkable on echocardiogram.  On aspirin and Brilinta. Cardiac cath 11/18/21 significant 2 vessel coronary disease, cardiology to discuss options for further management --> medical management and may consider revascularization at some point   Polysubstance abuse ([place]) Counseled on drug use.  Urine drug screen positive for cocaine, THC and opiates.  Noncompliance Counseled on the dangers of missing his medication.  Overweight with body mass index (BMI) of 27 to 27.9 in adult Meets criteria with BMI at 29  Tobacco use Counseled.  At this time, declines nicotine patch  Essential hypertension Restarted on medications.  Blood pressure improved, stable.  Other hyperlipidemia Triglycerides of 284 and HDL 29 with LDL 93.  Restarted on  high-dose Lipitor.             Discharge Instructions  Allergies as of 11/19/2021   No Known Allergies      Medication List     STOP taking these medications    VITAMIN B-3 PO       TAKE these medications    albuterol 108 (90 Base) MCG/ACT inhaler Commonly known as: VENTOLIN HFA Inhale 2 puffs into the lungs every 6 (six) hours as needed for wheezing or shortness of  breath.   amLODipine 10 MG tablet Commonly known as: NORVASC Take 0.5 tablets (5 mg total) by mouth daily. What changed: how much to take   aspirin EC 81 MG tablet Take 1 tablet (81 mg total) by mouth daily. Swallow whole.   atorvastatin 80 MG tablet Commonly known as: LIPITOR Take 1 tablet (80 mg total) by mouth daily.   Brilinta 90 MG Tabs tablet Generic drug: ticagrelor Take 1 tablet (90 mg total) by mouth every 12 (twelve) hours.   carvedilol 6.25 MG tablet Commonly known as: COREG Take 1 tablet (6.25 mg total) by mouth 2 (two) times daily with a meal.   isosorbide mononitrate 30 MG 24 hr tablet Commonly known as: IMDUR Take 3 tablets (90 mg total) by mouth daily. What changed: medication strength   losartan 50 MG tablet Commonly known as: COZAAR Take 0.5 tablets (25 mg total) by mouth daily.   multivitamin tablet Take 1 tablet by mouth daily.   nitroGLYCERIN 0.4 MG SL tablet Commonly known as: NITROSTAT Place 1 tablet (0.4 mg total) under the tongue every 5 (five) minutes as needed for chest pain (if chest pain not resolved after 3 doses, please call 911). What changed:  when to take this reasons to take this   pantoprazole 40 MG tablet Commonly known as: PROTONIX Take 1 tablet (40 mg total) by mouth daily.         Follow-up Information     Wellington Hampshire, MD. Go to.   Specialty: Cardiology Why: Appointment on Thursday, 11/21/2021 at 10:05am with Christell Faith, PA Contact information: [address] [place] [address] [phone]                 No Known Allergies   Subjective: feels well today, no chest pain overnight    Discharge Exam: BP 137/88 (BP Location: Right Arm)   Pulse 76   Temp 97.8 F (36.6 C) (Oral)   Resp 16   Ht 6' (1.829 m)   Wt 99.8 kg   SpO2 99%   BMI 29.84 kg/m  General: Pt is alert, awake, not in acute distress Cardiovascular: RRR, S1/S2 +, no rubs, no gallops Respiratory: CTA  bilaterally, no wheezing, no rhonchi Abdominal: Soft, NT, ND, bowel sounds + Extremities: no edema, no cyanosis     The results of significant diagnostics from this hospitalization (including imaging, microbiology, ancillary and laboratory) are listed below for reference.     Microbiology: Recent Results (from the past 240 hour(s))  SARS Coronavirus 2 by RT PCR (hospital order, performed in [facility] hospital lab) *cepheid single result test* Anterior Nasal Swab     Status: None   Collection Time: 11/16/21 10:15 AM   Specimen: Anterior Nasal Swab  Result Value Ref Range Status   SARS Coronavirus 2 by RT PCR NEGATIVE NEGATIVE Final    Comment: (NOTE) SARS-CoV-2 target nucleic acids are NOT DETECTED.  The SARS-CoV-2 RNA is generally detectable in upper and lower  respiratory specimens during the acute phase of infection. The lowest concentration of SARS-CoV-2 viral copies this assay can detect is 250 copies / mL. A negative result does not preclude SARS-CoV-2 infection and should not be used as the sole basis for treatment or other patient management decisions.  A negative result may occur with improper specimen collection / handling, submission of specimen other than nasopharyngeal swab, presence of viral mutation(s) within the areas targeted by this assay, and inadequate number of viral copies (<250 copies / mL). A negative result must be combined with clinical observations, patient history, and epidemiological information.  Fact Sheet for Patients:   https://www.patel.info/  Fact Sheet for Healthcare Providers: https://hall.com/  This test is not yet approved or  cleared by the [place] FDA and has been authorized for detection and/or diagnosis of SARS-CoV-2 by FDA under an Emergency Use Authorization (EUA).  This EUA will remain in effect (meaning this test can be used) for the duration of the COVID-19 declaration under  Section 564(b)(1) of the Act, 21 U.S.C. section 360bbb-3(b)(1), unless the authorization is terminated or revoked sooner.  Performed at [facility], [address] [place], [place] [address]      Labs: BNP (last 3 results) Recent Labs    06/27/21 1707 11/17/21 0043  BNP 18.9 123456   Basic Metabolic Panel: Recent Labs  Lab 11/16/21 0930 11/18/21 0625 11/19/21 0657  NA 140 140 136  K 3.9 4.0 3.8  CL 110 110 105  CO2 22 22 24   GLUCOSE 114* 88 162*  BUN 17 13 15   CREATININE 1.18 1.13 1.16  CALCIUM 9.2 9.0 8.9   Liver Function Tests: Recent Labs  Lab 11/16/21 0930  AST 29  ALT 20  ALKPHOS 79  BILITOT 0.6  PROT 7.0  ALBUMIN 3.5   No results for input(s): "LIPASE", "AMYLASE" in the last 168 hours. No results for input(s): "AMMONIA" in the last 168 hours. CBC: Recent Labs  Lab 11/16/21 0930 11/17/21 0043 11/18/21 0625 11/19/21 0657  WBC 8.2 7.4 8.0 7.5  NEUTROABS 5.0  --   --   --   HGB 15.2 14.3 14.7 15.3  HCT 44.3 41.5 42.3 43.9  MCV 86.0 86.6 85.8 85.2  PLT 319 309 313 311   Cardiac Enzymes: No results for input(s): "CKTOTAL", "CKMB", "CKMBINDEX", "TROPONINI" in the last 168 hours. BNP: Invalid input(s): "POCBNP" CBG: No results for input(s): "GLUCAP" in the last 168 hours. D-Dimer No results for input(s): "DDIMER" in the last 72 hours. Hgb A1c No results for input(s): "HGBA1C" in the last 72 hours. Lipid Profile Recent Labs    11/17/21 1216  CHOL 179  HDL 29*  LDLCALC 93  TRIG 284*  CHOLHDL 6.2   Thyroid function studies No results for input(s): "TSH", "T4TOTAL", "T3FREE", "THYROIDAB" in the last 72 hours.  Invalid input(s): "FREET3" Anemia work up No results for input(s): "VITAMINB12", "FOLATE", "FERRITIN", "TIBC", "IRON", "RETICCTPCT" in the last 72 hours. Urinalysis    Component Value Date/Time   COLORURINE YELLOW (A) 11/25/2019 1759   APPEARANCEUR CLEAR (A) 11/25/2019 1759   APPEARANCEUR Clear 06/30/2013 1513    LABSPEC 1.018 11/25/2019 1759   LABSPEC 1.015 06/30/2013 1513   PHURINE 5.0 11/25/2019 1759   GLUCOSEU NEGATIVE 11/25/2019 1759   GLUCOSEU Negative 06/30/2013 1513   HGBUR SMALL (A) 11/25/2019 1759   BILIRUBINUR NEGATIVE 11/25/2019 1759   BILIRUBINUR Negative 06/30/2013 1513   KETONESUR NEGATIVE 11/25/2019 1759   PROTEINUR 30 (A) 11/25/2019 1759   NITRITE NEGATIVE  11/25/2019 1759   LEUKOCYTESUR NEGATIVE 11/25/2019 1759   LEUKOCYTESUR Negative 06/30/2013 1513   Sepsis Labs Recent Labs  Lab 11/16/21 0930 11/17/21 0043 11/18/21 0625 11/19/21 0657  WBC 8.2 7.4 8.0 7.5   Microbiology Recent Results (from the past 240 hour(s))  SARS Coronavirus 2 by RT PCR (hospital order, performed in [facility] hospital lab) *cepheid single result test* Anterior Nasal Swab     Status: None   Collection Time: 11/16/21 10:15 AM   Specimen: Anterior Nasal Swab  Result Value Ref Range Status   SARS Coronavirus 2 by RT PCR NEGATIVE NEGATIVE Final    Comment: (NOTE) SARS-CoV-2 target nucleic acids are NOT DETECTED.  The SARS-CoV-2 RNA is generally detectable in upper and lower respiratory specimens during the acute phase of infection. The lowest concentration of SARS-CoV-2 viral copies this assay can detect is 250 copies / mL. A negative result does not preclude SARS-CoV-2 infection and should not be used as the sole basis for treatment or other patient management decisions.  A negative result may occur with improper specimen collection / handling, submission of specimen other than nasopharyngeal swab, presence of viral mutation(s) within the areas targeted by this assay, and inadequate number of viral copies (<250 copies / mL). A negative result must be combined with clinical observations, patient history, and epidemiological information.  Fact Sheet for Patients:   https://www.patel.info/  Fact Sheet for Healthcare  Providers: https://hall.com/  This test is not yet approved or  cleared by the [place] FDA and has been authorized for detection and/or diagnosis of SARS-CoV-2 by FDA under an Emergency Use Authorization (EUA).  This EUA will remain in effect (meaning this test can be used) for the duration of the COVID-19 declaration under Section 564(b)(1) of the Act, 21 U.S.C. section 360bbb-3(b)(1), unless the authorization is terminated or revoked sooner.  Performed at [facility], [address] [place], [place] [address]    Imaging ECHOCARDIOGRAM COMPLETE  Result Date: 11/16/2021    ECHOCARDIOGRAM REPORT   Patient Name:   Logan Crawford Date of Exam: 11/16/2021 Medical Rec #:  [id number]        Height:       73.0 in Accession #:    [id number]       Weight:       223.0 lb Date of Birth:  December 25, 1975       BSA:          2.254 m Patient Age:    46 years         BP:           139/92 mmHg Patient Gender: M                HR:           85 bpm. Exam Location:  [facility] Procedure: 2D Echo, Strain Analysis and 3D Echo Indications:     NSTEMI I21.4  History:         Patient has prior history of Echocardiogram examinations, most                  recent 07/16/2021.  Sonographer:     Kathlen Brunswick RDCS Referring Phys:  [id number] [place] Diagnosing Phys: Eleonore Chiquito MD  Sonographer Comments: Global longitudinal strain was attempted. IMPRESSIONS  1. Left ventricular ejection fraction, by estimation, is 60 to 65%. The left ventricle has normal function. The left ventricle has no regional wall motion abnormalities. There is moderate concentric left ventricular  hypertrophy. Left ventricular diastolic parameters are consistent with Grade I diastolic dysfunction (impaired relaxation).  2. Right ventricular systolic function is normal. The right ventricular size is normal. Tricuspid regurgitation signal is inadequate for assessing PA pressure.  3. The mitral valve is grossly  normal. Trivial mitral valve regurgitation. No evidence of mitral stenosis.  4. The aortic valve is tricuspid. There is mild calcification of the aortic valve. Aortic valve regurgitation is not visualized. Aortic valve sclerosis is present, with no evidence of aortic valve stenosis.  5. The inferior vena cava is normal in size with greater than 50% respiratory variability, suggesting right atrial pressure of 3 mmHg. Comparison(s): No significant change from prior study. FINDINGS  Left Ventricle: Left ventricular ejection fraction, by estimation, is 60 to 65%. The left ventricle has normal function. The left ventricle has no regional wall motion abnormalities. Global longitudinal strain performed but not reported based on interpreter judgement due to suboptimal tracking. The left ventricular internal cavity size was normal in size. There is moderate concentric left ventricular hypertrophy. Left ventricular diastolic parameters are consistent with Grade I diastolic dysfunction (impaired relaxation). Right Ventricle: The right ventricular size is normal. No increase in right ventricular wall thickness. Right ventricular systolic function is normal. Tricuspid regurgitation signal is inadequate for assessing PA pressure. Left Atrium: Left atrial size was normal in size. Right Atrium: Right atrial size was normal in size. Pericardium: There is no evidence of pericardial effusion. Mitral Valve: The mitral valve is grossly normal. Trivial mitral valve regurgitation. No evidence of mitral valve stenosis. Tricuspid Valve: The tricuspid valve is grossly normal. Tricuspid valve regurgitation is not demonstrated. No evidence of tricuspid stenosis. Aortic Valve: The aortic valve is tricuspid. There is mild calcification of the aortic valve. Aortic valve regurgitation is not visualized. Aortic valve sclerosis is present, with no evidence of aortic valve stenosis. Aortic valve peak gradient measures 9.4 mmHg. Pulmonic Valve: The  pulmonic valve was grossly normal. Pulmonic valve regurgitation is not visualized. No evidence of pulmonic stenosis. Aorta: The aortic root and ascending aorta are structurally normal, with no evidence of dilitation. Venous: The right lower pulmonary vein is normal. The inferior vena cava is normal in size with greater than 50% respiratory variability, suggesting right atrial pressure of 3 mmHg. IAS/Shunts: The atrial septum is grossly normal.  LEFT VENTRICLE PLAX 2D LVIDd:         3.80 cm     Diastology LVIDs:         2.50 cm     LV e' medial:    5.50 cm/s LV PW:         1.30 cm     LV E/e' medial:  11.9 LV IVS:        1.50 cm     LV e' lateral:   6.35 cm/s LVOT diam:     2.00 cm     LV E/e' lateral: 10.3 LV SV:         66 LV SV Index:   29 LVOT Area:     3.14 cm                             3D Volume EF: LV Volumes (MOD)           3D EF:        55 % LV vol d, MOD A2C: 64.4 ml LV EDV:       96 ml LV vol d, MOD  A4C: 62.1 ml LV ESV:       43 ml LV vol s, MOD A2C: 27.6 ml LV SV:        52 ml LV vol s, MOD A4C: 23.4 ml LV SV MOD A2C:     36.8 ml LV SV MOD A4C:     62.1 ml LV SV MOD BP:      37.5 ml RIGHT VENTRICLE RV Basal diam:  2.90 cm RV S prime:     15.30 cm/s TAPSE (M-mode): 1.8 cm LEFT ATRIUM             Index        RIGHT ATRIUM           Index LA diam:        3.30 cm 1.46 cm/m   RA Area:     11.30 cm LA Vol (A2C):   32.3 ml 14.33 ml/m  RA Volume:   23.00 ml  10.21 ml/m LA Vol (A4C):   32.1 ml 14.24 ml/m LA Biplane Vol: 33.0 ml 14.64 ml/m  AORTIC VALVE                 PULMONIC VALVE AV Area (Vmax): 2.39 cm     PV Vmax:       1.41 m/s AV Vmax:        153.67 cm/s  PV Peak grad:  7.9 mmHg AV Peak Grad:   9.4 mmHg LVOT Vmax:      117.00 cm/s LVOT Vmean:     78.900 cm/s LVOT VTI:       0.210 m  AORTA Ao Root diam: 3.50 cm Ao Asc diam:  2.90 cm MITRAL VALVE MV Area (PHT): 4.16 cm    SHUNTS MV Decel Time: 183 msec    Systemic VTI:  0.21 m MV E velocity: 65.25 cm/s  Systemic Diam: 2.00 cm MV A velocity: 84.90  cm/s MV E/A ratio:  0.77 Eleonore Chiquito MD Electronically signed by Eleonore Chiquito MD Signature Date/Time: 11/16/2021/3:02:31 PM    Final    DG Chest Portable 1 View  Result Date: 11/16/2021 CLINICAL DATA:  Chest pain beginning last night. Improved with sublingual nitroglycerin. EXAM: PORTABLE CHEST 1 VIEW COMPARISON:  06/27/2021 FINDINGS: The heart size and mediastinal contours are within normal limits. Both lungs are clear. The visualized skeletal structures are unremarkable. IMPRESSION: No active disease. Electronically Signed   By: Marlaine Hind M.D.   On: 11/16/2021 10:15      Time coordinating discharge: over 30 minutes  SIGNED:  Emeterio Reeve DO [facility]

## 2021-11-19 NOTE — Progress Notes (Signed)
Rounding Note    Patient Name: Logan Crawford Date of Encounter: 11/19/2021  [place] Cardiologist: Kate Sable, MD   Subjective   Patient seen on AM rounds. Denies any chest pain or shortness of breath. Underwent cardiac catheterization on 11/18/21 that revealed 80% proximal in-stent restenosis in the LAD and mils progression of RCA disease to 70%. The entire RCA is diffusely diseased.  Inpatient Medications    Scheduled Meds:  amLODipine  10 mg Oral Daily   aspirin EC  81 mg Oral Daily   atorvastatin  80 mg Oral Daily   carvedilol  6.25 mg Oral BID WC   isosorbide mononitrate  90 mg Oral Daily   losartan  25 mg Oral Daily   pantoprazole  40 mg Oral Daily   sodium chloride flush  3 mL Intravenous Q12H   ticagrelor  90 mg Oral Q12H   Continuous Infusions:  sodium chloride     PRN Meds: sodium chloride, acetaminophen, albuterol, morphine injection, nitroGLYCERIN, ondansetron (ZOFRAN) IV, mouth rinse, sodium chloride flush   Vital Signs    Vitals:   11/18/21 1944 11/18/21 2333 11/19/21 0445 11/19/21 0829  BP: 127/85 (!) 137/93 (!) 142/96 137/88  Pulse: 80 79 80 76  Resp: 20 20 20 16   Temp: 97.8 F (36.6 C) 97.6 F (36.4 C) 97.9 F (36.6 C) 97.8 F (36.6 C)  TempSrc:  Oral  Oral  SpO2: 98% 97% 98% 99%  Weight:      Height:        Intake/Output Summary (Last 24 hours) at 11/19/2021 0931 Last data filed at 11/18/2021 1801 Gross per 24 hour  Intake 698.34 ml  Output 625 ml  Net 73.34 ml      11/18/2021   11:27 AM 11/18/2021    4:00 AM 11/17/2021    5:25 AM  Last 3 Weights  Weight (lbs) 220 lb 221 lb 11.2 oz 222 lb 3.2 oz  Weight (kg) 99.791 kg 100.562 kg 100.789 kg      Telemetry    Sinus 70-80 - Personally Reviewed  ECG    No new tracings - Personally Reviewed  Physical Exam   GEN: No acute distress.   Neck: No JVD Cardiac: RRR, no murmurs, rubs, or gallops. Right groin cath site with opsite and gauze dressing clean,dry, and  intact, wihout bleeding or hematoma, 2+ femoral and PT pulse Respiratory: Clear to auscultation bilaterally. Respirations are unlabored at rest on room air. GI: Soft, nontender, non-distended  MS: No edema; No deformity. Neuro:  Nonfocal  Psych: Normal affect   Labs    High Sensitivity Troponin:   Recent Labs  Lab 11/16/21 0930 11/16/21 1137  TROPONINIHS 21* 29*     Chemistry Recent Labs  Lab 11/16/21 0930 11/18/21 0625 11/19/21 0657  NA 140 140 136  K 3.9 4.0 3.8  CL 110 110 105  CO2 22 22 24   GLUCOSE 114* 88 162*  BUN 17 13 15   CREATININE 1.18 1.13 1.16  CALCIUM 9.2 9.0 8.9  PROT 7.0  --   --   ALBUMIN 3.5  --   --   AST 29  --   --   ALT 20  --   --   ALKPHOS 79  --   --   BILITOT 0.6  --   --   GFRNONAA >60 >60 >60  ANIONGAP 8 8 7     Lipids  Recent Labs  Lab 11/17/21 1216  CHOL 179  TRIG 284*  HDL 29*  LDLCALC 93  CHOLHDL 6.2    Hematology Recent Labs  Lab 11/17/21 0043 11/18/21 0625 11/19/21 0657  WBC 7.4 8.0 7.5  RBC 4.79 4.93 5.15  HGB 14.3 14.7 15.3  HCT 41.5 42.3 43.9  MCV 86.6 85.8 85.2  MCH 29.9 29.8 29.7  MCHC 34.5 34.8 34.9  RDW 14.0 13.7 13.6  PLT 309 313 311   Thyroid No results for input(s): "TSH", "FREET4" in the last 168 hours.  BNP Recent Labs  Lab 11/17/21 0043  BNP 23.6    DDimer No results for input(s): "DDIMER" in the last 168 hours.   Radiology     Cardiac Studies  [facility] 11/18/21   Ost LM lesion is 30% stenosed.   Dist LAD lesion is 40% stenosed.   Ost RCA to Prox RCA lesion is 50% stenosed.   Prox RCA to Mid RCA lesion is 40% stenosed.   2nd Diag lesion is 30% stenosed.   RPDA lesion is 40% stenosed.   Mid LAD-3 lesion is 60% stenosed.   Mid LAD-1 lesion is 80% stenosed.   Mid RCA lesion is 70% stenosed.   The left ventricular systolic function is normal.   LV end diastolic pressure is mildly elevated.   The left ventricular ejection fraction is 55-65% by visual estimate.   1.  Significant two-vessel  coronary artery disease.  Patent overlapped LAD stents but there is evidence of significant in-stent restenosis in the proximal segment.  In addition, there is borderline 60% stenosis distal to the stent.  Known moderate RCA disease but the distal stenosis appears worse. Intracoronary nitroglycerin was given which improved the overall appearance of the coronary arteries. 2.  Normal LV systolic function and moderately elevated left ventricular end-diastolic pressure.   Recommendations: Difficult situation overall given the patient's poor compliance with medical therapy and continued cocaine use.  This is a second in-stent restenosis in the LAD.  Management options include CABG or balloon angioplasty of the proximal portion of the LAD stent.  Will discuss options with the patient. Diagnostic Dominance: Co-dominant   Patient Profile     46 y.o. male with a history of CAD s/p PCI, hypertension, hyperlipidemia, substance abuse, who is being seen and evaluated for unstable angina.  Assessment & Plan    Unstable angina/ recent PCI to the mid LAD -history of recent PCI to the mid LAD in 04/2021 -underwent PCI to the more proximal segment of the mid LAD in 2021 -presented with symptoms of chest discomfort -EKG was unchanged from previous studies -echocardiogram with normal function and NWMA -underwent cardiac cath on 11/18/21 and discussed the following options of maximizing antianginal therapy, increasing medication compliance prior to revascularization, balloon angioplasty on the proximal portion of the LAD stent, or CABG -after continued discussion patient has opted for maximizing medications  and verbalizing the importance of taking his medications as prescribed  Medication noncompliance -advised to continue to take all medications as prescribed -long standing history of not taking medications as indicated  -patient states understanding  Hypertension -blood pressure 137/88 -continue  amlodipine, coreg, losartan, and imdur -vital signs per unit protocol  Hyperlipidemia -LDL 57 05/19/21 -continued on atorvastatin 80 mg daily  Cocaine abuse -total cessation is advised     For questions or updates, please contact [facility] Please consult [email or web address] for contact info under        Signed, Catricia Scheerer, NP  11/19/2021, 9:31 AM

## 2021-11-20 NOTE — Progress Notes (Unsigned)
Cardiology Office Note    Date:  11/21/2021   ID:  Logan Crawford, DOB 1975-11-23, MRN [id number]  PCP:  Pcp, No  Cardiologist:  Debbe Odea, MD  Electrophysiologist:  None   Chief Complaint: Hospital follow-up  History of Present Illness:   Logan Crawford is a 46 y.o. male with history of CAD with NSTEMI status post PCI/DES to the LAD in 12/2019 status post PCI to the LAD due to in-stent restenosis, medication nonadherence, HTN, HLD, and polysubstance use including cocaine and tobacco use who presents for hospital follow-up as outlined below.  He was admitted to the hospital in 11/2019 with progressive weakness, fatigue, dyspnea, cough, and diarrhea.  He was found to be COVID-positive.  High-sensitivity troponin peaked at 32.  Echo showed an EF of 50 to 55%, no regional wall motion abnormalities, grade 1 diastolic dysfunction, normal RV systolic function and ventricular cavity size, and no significant valvular abnormalities.  He was readmitted to the hospital in 12/2019 with an NSTEMI with troponin peaking at 3520.  Echo at that time showed an EF of 50%, moderate hypokinesis of the mid apical anteroseptal wall, mild LVH, diastolic dysfunction, normal RV systolic function and ventricular cavity size, and mild aortic valve sclerosis without evidence of stenosis.  LHC showed severe one-vessel CAD with 90% stenosis in the mid LAD which was felt to be the likely culprit for the NSTEMI.  There was also 60% ostial to proximal RCA stenosis, 40% proximal to mid RCA stenosis, and 40% distal LAD stenosis.  LVEF was 45% with moderate to severe anterior apical hypokinesis.  He underwent successful PCI/DES to the mid LAD.  Lexiscan MPI in 11/2020 showed a small region of ischemia in the apical wall versus concern for over a processing, and was overall low risk.  He was admitted to the hospital in 04/2021 with chest pain with troponin peaking at 175.  Urine drug screen positive for cocaine.  Echo  showed an EF of 50 to 55%, hypokinesis of the mid to distal anteroseptal/septal and apical region, moderate LVH, grade 1 diastolic dysfunction, normal RV systolic function and ventricular cavity size, mild mitral regurgitation, aortic valve sclerosis without evidence of stenosis, and an estimated right atrial pressure of 3 mmHg.  LHC showed severe one-vessel CAD due to distal edge restenosis of the previously placed stent in the LAD.  In addition, there was extensive plaque distal to the stent involving the whole mid segment.  There was stable moderate RCA disease.  He underwent successful complex IVUS guided bifurcation angioplasty and DES placement to the mid LAD.  IVUS before PCI showed underexpansion distally as well as extensive plaque in distal to the stent.  No stent fractures.  Another stent was added distally due to suspected distal edge dissection.  PTCA was performed in the ostial D2 before placing the stent.  Echo in 06/2021 demonstrated an EF of 60 to 65%, no regional wall motion abnormalities, moderate LVH, grade 2 diastolic dysfunction, normal RV systolic function and ventricular cavity size, mild mitral regurgitation, and an estimated right atrial pressure of 3 mmHg.  He was most recently admitted to the hospital from 9/23 through 11/19/2021 with unstable angina with troponin peaking at 29.  He was not taking his medications.  Urine drug screen positive for cocaine.  Echo showed an EF of 60 to 65%, no regional wall motion abnormalities, moderate concentric LVH, grade 1 diastolic dysfunction, normal RV systolic function and ventricular cavity size, trivial mitral regurgitation, aortic valve  sclerosis without evidence of stenosis, and an estimated right atrial pressure of 3 mmHg.  LHC showed significant two-vessel CAD.  Patent overlapped LAD stents with evidence of significant in-stent restenosis in the proximal segment.  In addition, there was borderline 60% stenosis distal to the stent.  There was  known moderate RCA disease, though the distal stenosis appeared worse.  Intracoronary nitroglycerin was given which improved the overall appearance of the coronary arteries.  Normal LV systolic function with moderately elevated LVEDP.  Intervention was deferred at that time given overall difficult situation due to patient poor compliance and medical therapy and continued cocaine use.  After further discussion with the patient, the decision was made to optimize antianginal therapy with plan for outpatient follow-up and reconsideration of revascularization if he remained symptomatic and adherent to medications.  He comes in doing well from a cardiac perspective, and is without symptoms of angina or decompensation.  No dyspnea, palpitations, dizziness, presyncope, or syncope.  He has not smoked or used cocaine since his hospital discharge.  He reports adherence to all cardiac medications.  No falls, hematochezia, or melena.  He does note some mild erythema at the site of one of his left upper extremity peripheral IVs.  He indicates there was some purulent discharge from the site yesterday.  He has been without fever or chills.  He does not have any active cardiac issues or concerns at this time.   Labs independently reviewed: 10/2021 - potassium 3.8, BUN 15, serum creatinine 1.16, Hgb 15.3, PLT 311, TC 179, TG 284, HDL 29, LDL 93, LP(a) 50, cocaine positive, albumin 3.5, AST/ALT normal 04/2021 - A1c 5.8 07/2017 - TSH normal  Past Medical History:  Diagnosis Date   CAD (coronary artery disease)    a. 11/2019 NSTEMI/PCI: LM min irregs, LAD 74m (2.75x22 Resolute Onyx DES), 40d, D2 min irregs, LCX/LPAV min irregs, RCA 60ost, 40p/m, EF 45-50%; b. 11/2020 MV: EF 43%, small area of apical ischemia ->? over processing-->low risk.   Hyperlipidemia    Hypertension    Ischemic cardiomyopathy    a. 11/2019 LV Gram: EF 45-50%; b. 12/2019 Echo: EF 50% w/ mod mid-apical anteroseptal HK, GrI DD, nl RV fxn, mild Ao  sclerosis.   Tobacco use 11/25/2019    Past Surgical History:  Procedure Laterality Date   CORONARY STENT INTERVENTION N/A 01/24/2020   Procedure: CORONARY STENT INTERVENTION;  Surgeon: Iran Ouch, MD;  Location: [facility];  Service: Cardiovascular;  Laterality: N/A;   CORONARY STENT INTERVENTION N/A 05/20/2021   Procedure: CORONARY STENT INTERVENTION;  Surgeon: Iran Ouch, MD;  Location: [facility];  Service: Cardiovascular;  Laterality: N/A;   INTRAVASCULAR ULTRASOUND/IVUS N/A 05/20/2021   Procedure: Intravascular Ultrasound/IVUS;  Surgeon: Iran Ouch, MD;  Location: [facility];  Service: Cardiovascular;  Laterality: N/A;   IRRIGATION AND DEBRIDEMENT KNEE Left    LEFT HEART CATH AND CORONARY ANGIOGRAPHY N/A 01/24/2020   Procedure: LEFT HEART CATH AND CORONARY ANGIOGRAPHY;  Surgeon: Iran Ouch, MD;  Location: [facility];  Service: Cardiovascular;  Laterality: N/A;   LEFT HEART CATH AND CORONARY ANGIOGRAPHY N/A 11/18/2021   Procedure: LEFT HEART CATH AND CORONARY ANGIOGRAPHY;  Surgeon: Iran Ouch, MD;  Location: [facility];  Service: Cardiovascular;  Laterality: N/A;   LEFT HEART CATH AND CORS/GRAFTS ANGIOGRAPHY N/A 05/20/2021   Procedure: LEFT HEART CATH AND CORS/GRAFTS ANGIOGRAPHY;  Surgeon: Iran Ouch, MD;  Location: [facility];  Service: Cardiovascular;  Laterality: N/A;   TYMPANOSTOMY TUBE PLACEMENT Bilateral     Current Medications: Current Meds  Medication Sig   albuterol (VENTOLIN HFA) 108 (90 Base) MCG/ACT inhaler Inhale 2 puffs into the lungs every 6 (six) hours as needed for wheezing or shortness of breath.   amLODipine (NORVASC) 10 MG tablet Take 0.5 tablets (5 mg total) by mouth daily.   aspirin EC 81 MG tablet Take 1 tablet (81 mg total) by mouth daily. Swallow whole.   atorvastatin (LIPITOR) 80 MG tablet Take 1 tablet (80 mg total) by mouth daily.   carvedilol (COREG) 6.25 MG  tablet Take 1 tablet (6.25 mg total) by mouth 2 (two) times daily with a meal.   cephALEXin (KEFLEX) 500 MG capsule Take 1 capsule (500 mg total) by mouth 2 (two) times daily.   isosorbide mononitrate (IMDUR) 30 MG 24 hr tablet Take 3 tablets (90 mg total) by mouth daily.   losartan (COZAAR) 50 MG tablet Take 0.5 tablets (25 mg total) by mouth daily.   Multiple Vitamin (MULTIVITAMIN) tablet Take 1 tablet by mouth daily.   nitroGLYCERIN (NITROSTAT) 0.4 MG SL tablet Place 1 tablet (0.4 mg total) under the tongue every 5 (five) minutes as needed for chest pain (if chest pain not resolved after 3 doses, please call 911).   pantoprazole (PROTONIX) 40 MG tablet Take 1 tablet (40 mg total) by mouth daily.   ticagrelor (BRILINTA) 90 MG TABS tablet Take 1 tablet (90 mg total) by mouth every 12 (twelve) hours.    Allergies:   Patient has no known allergies.   Social History   Socioeconomic History   Marital status: Single    Spouse name: Not on file   Number of children: Not on file   Years of education: Not on file   Highest education level: Not on file  Occupational History   Not on file  Tobacco Use   Smoking status: Former    Packs/day: 1.00    Years: 22.00    Total pack years: 22.00    Types: Cigarettes    Quit date: 01/23/2020    Years since quitting: 1.8   Smokeless tobacco: Never   Tobacco comments:    Smokes 1-2 cigarettes daily    Vaping Use   Vaping Use: Never used  Substance and Sexual Activity   Alcohol use: Yes    Alcohol/week: 1.0 standard drink of alcohol    Types: 1 Shots of liquor per week    Comment: socially   Drug use: No   Sexual activity: Not on file  Other Topics Concern   Not on file  Social History Narrative   Not on file   Social Determinants of Health   Financial Resource Strain: Not on file  Food Insecurity: No Food Insecurity (11/16/2021)   Hunger Vital Sign    Worried About Running Out of Food in the Last Year: Never true    Ran Out of Food in  the Last Year: Never true  Transportation Needs: No Transportation Needs (11/16/2021)   PRAPARE - Hydrologist (Medical): No    Lack of Transportation (Non-Medical): No  Physical Activity: Not on file  Stress: Not on file  Social Connections: Not on file     Family History:  The patient's family history includes Arrhythmia in his mother; Coronary artery disease in his father and mother; Heart failure in his mother.  ROS:   12-point review of systems is negative unless  otherwise noted in the HPI.   EKGs/Labs/Other Studies Reviewed:    Studies reviewed were summarized above. The additional studies were reviewed today:  LHC 11/18/2021:   Ost LM lesion is 30% stenosed.   Dist LAD lesion is 40% stenosed.   Ost RCA to Prox RCA lesion is 50% stenosed.   Prox RCA to Mid RCA lesion is 40% stenosed.   2nd Diag lesion is 30% stenosed.   RPDA lesion is 40% stenosed.   Mid LAD-3 lesion is 60% stenosed.   Mid LAD-1 lesion is 80% stenosed.   Mid RCA lesion is 70% stenosed.   The left ventricular systolic function is normal.   LV end diastolic pressure is mildly elevated.   The left ventricular ejection fraction is 55-65% by visual estimate.   1.  Significant two-vessel coronary artery disease.  Patent overlapped LAD stents but there is evidence of significant in-stent restenosis in the proximal segment.  In addition, there is borderline 60% stenosis distal to the stent.  Known moderate RCA disease but the distal stenosis appears worse. Intracoronary nitroglycerin was given which improved the overall appearance of the coronary arteries. 2.  Normal LV systolic function and moderately elevated left ventricular end-diastolic pressure.   Recommendations: Difficult situation overall given the patient's poor compliance with medical therapy and continued cocaine use.  This is a second in-stent restenosis in the LAD.  Management options include CABG or balloon angioplasty of  the proximal portion of the LAD stent.  Will discuss options with the patient. __________  2D echo 11/16/2021: 1. Left ventricular ejection fraction, by estimation, is 60 to 65%. The  left ventricle has normal function. The left ventricle has no regional  wall motion abnormalities. There is moderate concentric left ventricular  hypertrophy. Left ventricular  diastolic parameters are consistent with Grade I diastolic dysfunction  (impaired relaxation).   2. Right ventricular systolic function is normal. The right ventricular  size is normal. Tricuspid regurgitation signal is inadequate for assessing  PA pressure.   3. The mitral valve is grossly normal. Trivial mitral valve  regurgitation. No evidence of mitral stenosis.   4. The aortic valve is tricuspid. There is mild calcification of the  aortic valve. Aortic valve regurgitation is not visualized. Aortic valve  sclerosis is present, with no evidence of aortic valve stenosis.   5. The inferior vena cava is normal in size with greater than 50%  respiratory variability, suggesting right atrial pressure of 3 mmHg.   Comparison(s): No significant change from prior study. __________  Limited echo 07/16/2021: 1. Left ventricular ejection fraction, by estimation, is 60 to 65%. The  left ventricle has normal function. The left ventricle has no regional  wall motion abnormalities. There is moderate left ventricular hypertrophy.  Left ventricular diastolic  parameters are consistent with Grade II diastolic dysfunction  (pseudonormalization). The average left ventricular global longitudinal  strain is -11.7 %.   2. Right ventricular systolic function is normal. The right ventricular  size is normal. Tricuspid regurgitation signal is inadequate for assessing  PA pressure.   3. The mitral valve is normal in structure. Mild mitral valve  regurgitation. No evidence of mitral stenosis.   4. The aortic valve is normal in structure. Aortic valve  regurgitation is  not visualized. No aortic stenosis is present.   5. The inferior vena cava is normal in size with greater than 50%  respiratory variability, suggesting right atrial pressure of 3 mmHg. __________  LHC 05/20/2021:   Dist LAD lesion  is 40% stenosed.   Prox RCA to Mid RCA lesion is 40% stenosed.   Ost RCA to Prox RCA lesion is 50% stenosed.   RPDA lesion is 40% stenosed.   Ost LM lesion is 30% stenosed.   Mid LAD lesion is 85% stenosed.   2nd Diag lesion is 60% stenosed.   A drug-eluting stent was successfully placed using a [employer] [id number].   Balloon angioplasty was performed using a BALLN TREK RX [id number].   Post intervention, there is a 0% residual stenosis.   Post intervention, there is a 30% residual stenosis.   LV end diastolic pressure is moderately elevated.   The left ventricular ejection fraction is 50-55% by visual estimate.   1.  Severe one-vessel coronary artery disease due to distal edge restenosis of the previously placed stent in the left anterior descending artery.  In addition, there is extensive plaque distal to the stent involving the whole mid segment.  Stable moderate right coronary artery disease. 2.  Low normal LV systolic function with an EF of 50 to 55% with mild anterior apical hypokinesis.  Moderately elevated left ventricular end-diastolic pressure. 3.  Successful complex IVUS guided bifurcation angioplasty and drug-eluting stent placement to the mid LAD.  IVUS before PCI showed stent underexpansion distally as well as extensive plaque distal to the stent.  No stent fractures.  Another stent was added distally due to suspected distal edge dissection.  Balloon angioplasty was performed and the ostial second diagonal before placing the stent.    Recommendations: Dual antiplatelet therapy for at least 1 year. Aggressive treatment of risk factors. I discussed with the patient the importance of compliance with medications. AVOID  catheterization via the right radial artery in the future due to significant radial artery spasm.  Only a 5 [place] guide could be advanced. __________  2D echo 05/18/2021:  1. Left ventricular ejection fraction, by estimation, is 50 to 55%. The  left ventricle has low normal function. The left ventricle demonstrates  regional wall motion abnormalities (hypokinesis of the mid to distal  anteroseptal/septal, apical region There   is moderate left ventricular hypertrophy. Left ventricular diastolic  parameters are consistent with Grade I diastolic dysfunction (impaired  relaxation).   2. Right ventricular systolic function is normal. The right ventricular  size is normal. Tricuspid regurgitation signal is inadequate for assessing  PA pressure.   3. The mitral valve is normal in structure. Mild mitral valve  regurgitation. No evidence of mitral stenosis.   4. The aortic valve was not well visualized. Aortic valve regurgitation  is not visualized. Aortic valve sclerosis is present, with no evidence of  aortic valve stenosis.   5. The inferior vena cava is normal in size with greater than 50%  respiratory variability, suggesting right atrial pressure of 3 mmHg. __________  [facility] MPI 11/29/2020: Pharmacological myocardial perfusion imaging study with small region of ischemia in the apical wall on attenuation corrected images  This defect is not noted on non-attenuation corrected images, raising the concern for over processing. Normal wall motion, EF estimated at 43% (unable to exclude component of GI uptake artifact) No EKG changes concerning for ischemia at peak stress or in recovery. Resting EKG with diffuse ST and T wave abnormality V3 through V6, I, II and aVL CT attenuation correction images with heavy coronary calcification in the LAD (stent), mild aortic atherosclerosis in the arch Low risk scan __________  [facility] 01/24/2020: There is mild left ventricular systolic dysfunction. The  left ventricular  ejection fraction is 45-50% by visual estimate. LV end diastolic pressure is moderately elevated. Ost RCA to Prox RCA lesion is 60% stenosed. Prox RCA to Mid RCA lesion is 40% stenosed. Mid LAD lesion is 90% stenosed. Post intervention, there is a 0% residual stenosis. A drug-eluting stent was successfully placed using a STENT RESOLUTE ONYX [id number]. Dist LAD lesion is 40% stenosed.   1.  Severe one-vessel coronary artery disease with 90% stenosis in the mid LAD which is the likely culprit for non-ST elevation myocardial infarction.  There is also moderate right coronary artery disease. 2.  Mildly reduced LV systolic function with an EF of 45% with moderate to severe anteroapical hypokinesis. 3.  Moderately elevated left ventricular end-diastolic pressure at 22 mmHg 4.  Successful angioplasty and drug-eluting stent placement to the mid LAD.   Recommendations: Dual antiplatelet therapy for at least 1 year.  Consult case manager for assistance with [place].  Brilinta can be switched to clopidogrel or prasugrel after 1 month of treatment if cost is an issue. I switch metoprolol to carvedilol.  Resume ACE inhibitor if renal function returns to baseline. Smoking cessation. Cardiac rehab. Likely discharge home tomorrow. __________  2D echo 01/24/2020: 1. Left ventricular ejection fraction, by estimation, is 50%. The left  ventricle has low normal function. The left ventricle demonstrates  regional wall motion abnormalities (see scoring diagram/findings for  description). There is mild left ventricular  hypertrophy. Left ventricular diastolic parameters are consistent with  Grade I diastolic dysfunction (impaired relaxation). There is moderate  hypokinesis of the left ventricular, mid-apical anteroseptal wall.   2. Right ventricular systolic function is normal. The right ventricular  size is normal.   3. The mitral valve is normal in structure. No evidence of mitral valve   regurgitation.   4. The aortic valve is tricuspid. Aortic valve regurgitation is not  visualized. Mild aortic valve sclerosis is present, with no evidence of  aortic valve stenosis. __________  2D echo 11/26/2019: 1. Left ventricular ejection fraction, by estimation, is 50 to 55%. The  left ventricle has low normal function. The left ventricle has no regional  wall motion abnormalities. Left ventricular diastolic parameters are  consistent with Grade I diastolic  dysfunction (impaired relaxation).   2. Right ventricular systolic function is normal. The right ventricular  size is normal.   3. The mitral valve is normal in structure. No evidence of mitral valve  regurgitation.   4. The aortic valve is calcified. Aortic valve regurgitation is not  visualized. Mild aortic valve sclerosis is present, with no evidence of  aortic valve stenosis.   EKG:  EKG is ordered today.  The EKG ordered today demonstrates NSR, 87 bpm, prior inferior and anterior infarcts, lateral T wave inversion  Recent Labs: 11/16/2021: ALT 20 11/17/2021: B Natriuretic Peptide 23.6 11/19/2021: BUN 15; Creatinine, Ser 1.16; Hemoglobin 15.3; Platelets 311; Potassium 3.8; Sodium 136  Recent Lipid Panel    Component Value Date/Time   CHOL 179 11/17/2021 1216   TRIG 284 (H) 11/17/2021 1216   HDL 29 (L) 11/17/2021 1216   CHOLHDL 6.2 11/17/2021 1216   VLDL 57 (H) 11/17/2021 1216   LDLCALC 93 11/17/2021 1216    PHYSICAL EXAM:    VS:  BP 104/74 (BP Location: Left Arm, Patient Position: Sitting, Cuff Size: Normal)   Pulse 87   Ht 6' (1.829 m)   Wt 225 lb 12.8 oz (102.4 kg)   SpO2 98%   BMI 30.62 kg/m   BMI: Body mass  index is 30.62 kg/m.  Physical Exam Vitals reviewed.  Constitutional:      Appearance: He is well-developed.  HENT:     Head: Normocephalic and atraumatic.  Eyes:     General:        Right eye: No discharge.        Left eye: No discharge.  Neck:     Vascular: No JVD.  Cardiovascular:      Rate and Rhythm: Normal rate and regular rhythm.     Pulses:          Posterior tibial pulses are 2+ on the right side and 2+ on the left side.     Heart sounds: Normal heart sounds, S1 normal and S2 normal. Heart sounds not distant. No midsystolic click and no opening snap. No murmur heard.    No friction rub.     Comments: No right femoral arteriotomy site complications.  No bleeding, bruising, swelling, warmth, erythema, or tenderness to palpation.  No bruit. Pulmonary:     Effort: Pulmonary effort is normal. No respiratory distress.     Breath sounds: Normal breath sounds. No decreased breath sounds, wheezing or rales.  Chest:     Chest wall: No tenderness.  Abdominal:     General: There is no distension.  Musculoskeletal:     Cervical back: Normal range of motion.     Right lower leg: No edema.     Left lower leg: No edema.  Skin:    General: Skin is warm and dry.     Nails: There is no clubbing.     Comments: Mildly erythematous left antecubital lesion without active bleeding.  Neurological:     Mental Status: He is alert and oriented to person, place, and time.  Psychiatric:        Speech: Speech normal.        Behavior: Behavior normal.        Thought Content: Thought content normal.        Judgment: Judgment normal.     Wt Readings from Last 3 Encounters:  11/21/21 225 lb 12.8 oz (102.4 kg)  11/18/21 220 lb (99.8 kg)  06/27/21 219 lb (99.3 kg)     ASSESSMENT & PLAN:   CAD involving the native coronary arteries with stable angina: No further chest pain since hospital discharge.  Continue aggressive risk factor modification and secondary prevention including aspirin, ticagrelor, amlodipine, atorvastatin, carvedilol, isosorbide mononitrate, losartan, and as needed SL NTG.  Given lack of anginal symptoms, and in the context of prior medication adherence with ongoing cocaine use, preference has been to medically manage at this time with reservation for PTCA of the LAD  in-stent restenosis for refractory angina despite optimization of medical therapy, medication adherence, and polysubstance use cessation.  No right femoral arteriotomy site complications.  Recommend cardiac rehab.  HTN: Blood pressure is well controlled in the office today.  Continue current medical therapy.  HLD: LDL 93 in 10/2021 with normal AST/ALT at that time.  Now on atorvastatin 80 mg.  Follow-up fasting lipid panel and LFT in 2 months.  Polysubstance use: Reports no further cocaine or tobacco use since hospital discharge.  Medication nonadherence: He reports adherence to medications.  Cellulitis: Appears to be from IV site.  Keflex 5 mg twice daily for 7 days.   Disposition: F/u with Dr. Garen Lah or an APP in 1 month.   Medication Adjustments/Labs and Tests Ordered: Current medicines are reviewed at length with the patient today.  Concerns  regarding medicines are outlined above. Medication changes, Labs and Tests ordered today are summarized above and listed in the Patient Instructions accessible in Encounters.   Signed, Christell Faith, PA-C 11/21/2021 11:58 AM     Logan Crawford [address] [place], [place] [address] [phone]

## 2021-11-21 ENCOUNTER — Ambulatory Visit: Payer: Self-pay | Attending: Physician Assistant | Admitting: Physician Assistant

## 2021-11-21 ENCOUNTER — Encounter: Payer: Self-pay | Admitting: Physician Assistant

## 2021-11-21 VITALS — BP 104/74 | HR 87 | Ht 72.0 in | Wt 225.8 lb

## 2021-11-21 DIAGNOSIS — I10 Essential (primary) hypertension: Secondary | ICD-10-CM

## 2021-11-21 DIAGNOSIS — L03114 Cellulitis of left upper limb: Secondary | ICD-10-CM

## 2021-11-21 DIAGNOSIS — Z9189 Other specified personal risk factors, not elsewhere classified: Secondary | ICD-10-CM

## 2021-11-21 DIAGNOSIS — E785 Hyperlipidemia, unspecified: Secondary | ICD-10-CM

## 2021-11-21 DIAGNOSIS — R0602 Shortness of breath: Secondary | ICD-10-CM

## 2021-11-21 DIAGNOSIS — I25118 Atherosclerotic heart disease of native coronary artery with other forms of angina pectoris: Secondary | ICD-10-CM

## 2021-11-21 DIAGNOSIS — Z720 Tobacco use: Secondary | ICD-10-CM

## 2021-11-21 DIAGNOSIS — F1490 Cocaine use, unspecified, uncomplicated: Secondary | ICD-10-CM

## 2021-11-21 MED ORDER — CEPHALEXIN 500 MG PO CAPS: 500.0000 mg | ORAL_CAPSULE | Freq: Two times a day (BID) | ORAL | 0 refills | Status: DC

## 2021-11-21 NOTE — Patient Instructions (Signed)
Medication Instructions:  Your physician has recommended you make the following change in your medication:   START Keflex 500 mg twice a day for 7 days.  *If you need a refill on your cardiac medications before your next appointment, please call your pharmacy*   Lab Work: None  If you have labs (blood work) drawn today and your tests are completely normal, you will receive your results only by: [place] (if you have MyChart) OR A paper copy in the mail If you have any lab test that is abnormal or we need to change your treatment, we will call you to review the results.   Testing/Procedures: None   Follow-Up: At [facility], you and your health needs are our priority.  As part of our continuing mission to provide you with exceptional heart care, we have created designated Provider Care Teams.  These Care Teams include your primary Cardiologist (physician) and Advanced Practice Providers (APPs -  Physician Assistants and Nurse Practitioners) who all work together to provide you with the care you need, when you need it.  Your next appointment:   1 month(s)  The format for your next appointment:   In Person  Provider:   Kate Sable, MD or Christell Faith, PA-C         Important Information About Sugar

## 2021-11-22 ENCOUNTER — Other Ambulatory Visit: Payer: Self-pay

## 2021-12-02 ENCOUNTER — Encounter: Payer: Self-pay | Admitting: Pharmacy Technician

## 2021-12-02 NOTE — Patient Outreach (Signed)
Attempted to contact patient to discuss PAP for Brilinta.  Unable to leave message.  Voice mailbox not set-up.  Completing application and mailing to patient along with requesting patient provide 2023 proof of income.  Jacquelynn Cree Patient Advocate Specialist [place] at [facility]

## 2021-12-10 ENCOUNTER — Encounter: Payer: Self-pay | Admitting: *Deleted

## 2021-12-10 DIAGNOSIS — Z006 Encounter for examination for normal comparison and control in clinical research program: Secondary | ICD-10-CM

## 2021-12-10 NOTE — Research (Signed)
Attempted to reach Logan Crawford about [employer]. His voice mail has not been set up, so I was not able to leave a message.

## 2021-12-12 ENCOUNTER — Other Ambulatory Visit: Payer: Self-pay

## 2021-12-12 MED ORDER — TICAGRELOR 90 MG PO TABS: ORAL_TABLET | ORAL | 3 refills | Status: DC

## 2021-12-20 ENCOUNTER — Other Ambulatory Visit: Payer: Self-pay

## 2021-12-23 ENCOUNTER — Ambulatory Visit: Payer: Self-pay | Admitting: Cardiology

## 2022-01-20 NOTE — Progress Notes (Deleted)
Cardiology Office Note    Date:  01/20/2022   ID:  Logan HummerJohnathan C Chou, DOB 05/02/1975, MRN [id number]  PCP:  Pcp, No  Cardiologist:  Debbe OdeaBrian Agbor-Etang, MD  Electrophysiologist:  None   Chief Complaint: Follow-up  History of Present Illness:   Logan Crawford is a 46 y.o. male with history of CAD with NSTEMI status post PCI/DES to the LAD in 12/2019 status post PCI to the LAD due to in-stent restenosis, medication nonadherence, HTN, HLD, and polysubstance use including cocaine and tobacco use who presents for follow-up of his CAD.   He was admitted to the hospital in 11/2019 with progressive weakness, fatigue, dyspnea, cough, and diarrhea.  He was found to be COVID-positive.  High-sensitivity troponin peaked at 32.  Echo showed an EF of 50 to 55%, no regional wall motion abnormalities, grade 1 diastolic dysfunction, normal RV systolic function and ventricular cavity size, and no significant valvular abnormalities.   He was readmitted to the hospital in 12/2019 with an NSTEMI with troponin peaking at 3520.  Echo at that time showed an EF of 50%, moderate hypokinesis of the mid apical anteroseptal wall, mild LVH, diastolic dysfunction, normal RV systolic function and ventricular cavity size, and mild aortic valve sclerosis without evidence of stenosis.  LHC showed severe one-vessel CAD with 90% stenosis in the mid LAD which was felt to be the likely culprit for the NSTEMI.  There was also 60% ostial to proximal RCA stenosis, 40% proximal to mid RCA stenosis, and 40% distal LAD stenosis.  LVEF was 45% with moderate to severe anterior apical hypokinesis.  He underwent successful PCI/DES to the mid LAD.  Lexiscan MPI in 11/2020 showed a small region of ischemia in the apical wall versus concern for over a processing, and was overall low risk.  He was admitted to the hospital in 04/2021 with chest pain with troponin peaking at 175.  Urine drug screen positive for cocaine.  Echo showed an EF of 50 to 55%,  hypokinesis of the mid to distal anteroseptal/septal and apical region, moderate LVH, grade 1 diastolic dysfunction, normal RV systolic function and ventricular cavity size, mild mitral regurgitation, aortic valve sclerosis without evidence of stenosis, and an estimated right atrial pressure of 3 mmHg.  LHC showed severe one-vessel CAD due to distal edge restenosis of the previously placed stent in the LAD.  In addition, there was extensive plaque distal to the stent involving the whole mid segment.  There was stable moderate RCA disease.  He underwent successful complex IVUS guided bifurcation angioplasty and DES placement to the mid LAD.  IVUS before PCI showed underexpansion distally as well as extensive plaque in distal to the stent.  No stent fractures.  Another stent was added distally due to suspected distal edge dissection.  PTCA was performed in the ostial D2 before placing the stent.  Echo in 06/2021 demonstrated an EF of 60 to 65%, no regional wall motion abnormalities, moderate LVH, grade 2 diastolic dysfunction, normal RV systolic function and ventricular cavity size, mild mitral regurgitation, and an estimated right atrial pressure of 3 mmHg.   He was most recently admitted to the hospital from 9/23 through 11/19/2021 with unstable angina with troponin peaking at 29.  He was not taking his medications.  Urine drug screen positive for cocaine.  Echo showed an EF of 60 to 65%, no regional wall motion abnormalities, moderate concentric LVH, grade 1 diastolic dysfunction, normal RV systolic function and ventricular cavity size, trivial mitral regurgitation, aortic  valve sclerosis without evidence of stenosis, and an estimated right atrial pressure of 3 mmHg.  LHC showed significant two-vessel CAD.  Patent overlapped LAD stents with evidence of significant in-stent restenosis in the proximal segment.  In addition, there was borderline 60% stenosis distal to the stent.  There was known moderate RCA disease,  though the distal stenosis appeared worse.  Intracoronary nitroglycerin was given which improved the overall appearance of the coronary arteries.  Normal LV systolic function with moderately elevated LVEDP.  Intervention was deferred at that time given overall difficult situation due to patient poor compliance and medical therapy and continued cocaine use.  After further discussion with the patient, the decision was made to optimize antianginal therapy with plan for outpatient follow-up and reconsideration of revascularization if he remained symptomatic and adherent to medications.  He was seen in hospital follow-up on 11/21/2021 and was without symptoms of angina or decompensation.  He was abstaining from tobacco/cocaine use.  He reported adherence to cardiac medications.  He did have some cellulitis at his peripheral IV site and was prescribed Keflex.  ***   Labs independently reviewed: 10/2021 - potassium 3.8, BUN 15, serum creatinine 1.16, Hgb 15.3, PLT 311, TC 179, TG 284, HDL 29, LDL 93, LP(a) 50, cocaine positive, albumin 3.5, AST/ALT normal 04/2021 - A1c 5.8 07/2017 - TSH normal  Past Medical History:  Diagnosis Date   CAD (coronary artery disease)    a. 11/2019 NSTEMI/PCI: LM min irregs, LAD 35m (2.75x22 Resolute Onyx DES), 40d, D2 min irregs, LCX/LPAV min irregs, RCA 60ost, 40p/m, EF 45-50%; b. 11/2020 MV: EF 43%, small area of apical ischemia ->? over processing-->low risk.   Hyperlipidemia    Hypertension    Ischemic cardiomyopathy    a. 11/2019 LV Gram: EF 45-50%; b. 12/2019 Echo: EF 50% w/ mod mid-apical anteroseptal HK, GrI DD, nl RV fxn, mild Ao sclerosis.   Tobacco use 11/25/2019    Past Surgical History:  Procedure Laterality Date   CORONARY STENT INTERVENTION N/A 01/24/2020   Procedure: CORONARY STENT INTERVENTION;  Surgeon: Iran Ouch, MD;  Location: [facility] INVASIVE CV LAB;  Service: Cardiovascular;  Laterality: N/A;   CORONARY STENT INTERVENTION N/A 05/20/2021    Procedure: CORONARY STENT INTERVENTION;  Surgeon: Iran Ouch, MD;  Location: [facility] INVASIVE CV LAB;  Service: Cardiovascular;  Laterality: N/A;   INTRAVASCULAR ULTRASOUND/IVUS N/A 05/20/2021   Procedure: Intravascular Ultrasound/IVUS;  Surgeon: Iran Ouch, MD;  Location: [facility] INVASIVE CV LAB;  Service: Cardiovascular;  Laterality: N/A;   IRRIGATION AND DEBRIDEMENT KNEE Left    LEFT HEART CATH AND CORONARY ANGIOGRAPHY N/A 01/24/2020   Procedure: LEFT HEART CATH AND CORONARY ANGIOGRAPHY;  Surgeon: Iran Ouch, MD;  Location: [facility] INVASIVE CV LAB;  Service: Cardiovascular;  Laterality: N/A;   LEFT HEART CATH AND CORONARY ANGIOGRAPHY N/A 11/18/2021   Procedure: LEFT HEART CATH AND CORONARY ANGIOGRAPHY;  Surgeon: Iran Ouch, MD;  Location: [facility] INVASIVE CV LAB;  Service: Cardiovascular;  Laterality: N/A;   LEFT HEART CATH AND CORS/GRAFTS ANGIOGRAPHY N/A 05/20/2021   Procedure: LEFT HEART CATH AND CORS/GRAFTS ANGIOGRAPHY;  Surgeon: Iran Ouch, MD;  Location: [facility] INVASIVE CV LAB;  Service: Cardiovascular;  Laterality: N/A;   TYMPANOSTOMY TUBE PLACEMENT Bilateral     Current Medications: No outpatient medications have been marked as taking for the 01/24/22 encounter (Appointment) with Sondra Barges, PA-C.    Allergies:   Patient has no known allergies.   Social History   Socioeconomic History  Marital status: Single    Spouse name: Not on file   Number of children: Not on file   Years of education: Not on file   Highest education level: Not on file  Occupational History   Not on file  Tobacco Use   Smoking status: Former    Packs/day: 1.00    Years: 22.00    Total pack years: 22.00    Types: Cigarettes    Quit date: 01/23/2020    Years since quitting: 1.9   Smokeless tobacco: Never   Tobacco comments:    Smokes 1-2 cigarettes daily    Vaping Use   Vaping Use: Never used  Substance and Sexual Activity   Alcohol use: Yes    Alcohol/week: 1.0 standard  drink of alcohol    Types: 1 Shots of liquor per week    Comment: socially   Drug use: No   Sexual activity: Not on file  Other Topics Concern   Not on file  Social History Narrative   Not on file   Social Determinants of Health   Financial Resource Strain: Not on file  Food Insecurity: No Food Insecurity (11/16/2021)   Hunger Vital Sign    Worried About Running Out of Food in the Last Year: Never true    Ran Out of Food in the Last Year: Never true  Transportation Needs: No Transportation Needs (11/16/2021)   PRAPARE - Administrator, Civil Service (Medical): No    Lack of Transportation (Non-Medical): No  Physical Activity: Not on file  Stress: Not on file  Social Connections: Not on file     Family History:  The patient's family history includes Arrhythmia in his mother; Coronary artery disease in his father and mother; Heart failure in his mother.  ROS:   ROS   EKGs/Labs/Other Studies Reviewed:    Studies reviewed were summarized above. The additional studies were reviewed today:  LHC 11/18/2021:   Ost LM lesion is 30% stenosed.   Dist LAD lesion is 40% stenosed.   Ost RCA to Prox RCA lesion is 50% stenosed.   Prox RCA to Mid RCA lesion is 40% stenosed.   2nd Diag lesion is 30% stenosed.   RPDA lesion is 40% stenosed.   Mid LAD-3 lesion is 60% stenosed.   Mid LAD-1 lesion is 80% stenosed.   Mid RCA lesion is 70% stenosed.   The left ventricular systolic function is normal.   LV end diastolic pressure is mildly elevated.   The left ventricular ejection fraction is 55-65% by visual estimate.   1.  Significant two-vessel coronary artery disease.  Patent overlapped LAD stents but there is evidence of significant in-stent restenosis in the proximal segment.  In addition, there is borderline 60% stenosis distal to the stent.  Known moderate RCA disease but the distal stenosis appears worse. Intracoronary nitroglycerin was given which improved the overall  appearance of the coronary arteries. 2.  Normal LV systolic function and moderately elevated left ventricular end-diastolic pressure.   Recommendations: Difficult situation overall given the patient's poor compliance with medical therapy and continued cocaine use.  This is a second in-stent restenosis in the LAD.  Management options include CABG or balloon angioplasty of the proximal portion of the LAD stent.  Will discuss options with the patient. __________   2D echo 11/16/2021: 1. Left ventricular ejection fraction, by estimation, is 60 to 65%. The  left ventricle has normal function. The left ventricle has no regional  wall motion  abnormalities. There is moderate concentric left ventricular  hypertrophy. Left ventricular  diastolic parameters are consistent with Grade I diastolic dysfunction  (impaired relaxation).   2. Right ventricular systolic function is normal. The right ventricular  size is normal. Tricuspid regurgitation signal is inadequate for assessing  PA pressure.   3. The mitral valve is grossly normal. Trivial mitral valve  regurgitation. No evidence of mitral stenosis.   4. The aortic valve is tricuspid. There is mild calcification of the  aortic valve. Aortic valve regurgitation is not visualized. Aortic valve  sclerosis is present, with no evidence of aortic valve stenosis.   5. The inferior vena cava is normal in size with greater than 50%  respiratory variability, suggesting right atrial pressure of 3 mmHg.   Comparison(s): No significant change from prior study. __________   Limited echo 07/16/2021: 1. Left ventricular ejection fraction, by estimation, is 60 to 65%. The  left ventricle has normal function. The left ventricle has no regional  wall motion abnormalities. There is moderate left ventricular hypertrophy.  Left ventricular diastolic  parameters are consistent with Grade II diastolic dysfunction  (pseudonormalization). The average left ventricular  global longitudinal  strain is -11.7 %.   2. Right ventricular systolic function is normal. The right ventricular  size is normal. Tricuspid regurgitation signal is inadequate for assessing  PA pressure.   3. The mitral valve is normal in structure. Mild mitral valve  regurgitation. No evidence of mitral stenosis.   4. The aortic valve is normal in structure. Aortic valve regurgitation is  not visualized. No aortic stenosis is present.   5. The inferior vena cava is normal in size with greater than 50%  respiratory variability, suggesting right atrial pressure of 3 mmHg. __________   LHC 05/20/2021:   Dist LAD lesion is 40% stenosed.   Prox RCA to Mid RCA lesion is 40% stenosed.   Ost RCA to Prox RCA lesion is 50% stenosed.   RPDA lesion is 40% stenosed.   Ost LM lesion is 30% stenosed.   Mid LAD lesion is 85% stenosed.   2nd Diag lesion is 60% stenosed.   A drug-eluting stent was successfully placed using a STENT ONYX FRONTIER [id number].   Balloon angioplasty was performed using a BALLN TREK RX 2.5X12.   Post intervention, there is a 0% residual stenosis.   Post intervention, there is a 30% residual stenosis.   LV end diastolic pressure is moderately elevated.   The left ventricular ejection fraction is 50-55% by visual estimate.   1.  Severe one-vessel coronary artery disease due to distal edge restenosis of the previously placed stent in the left anterior descending artery.  In addition, there is extensive plaque distal to the stent involving the whole mid segment.  Stable moderate right coronary artery disease. 2.  Low normal LV systolic function with an EF of 50 to 55% with mild anterior apical hypokinesis.  Moderately elevated left ventricular end-diastolic pressure. 3.  Successful complex IVUS guided bifurcation angioplasty and drug-eluting stent placement to the mid LAD.  IVUS before PCI showed stent underexpansion distally as well as extensive plaque distal to the stent.  No stent  fractures.  Another stent was added distally due to suspected distal edge dissection.  Balloon angioplasty was performed and the ostial second diagonal before placing the stent.    Recommendations: Dual antiplatelet therapy for at least 1 year. Aggressive treatment of risk factors. I discussed with the patient the importance of compliance with medications. AVOID catheterization  via the right radial artery in the future due to significant radial artery spasm.  Only a 5 [place] guide could be advanced. __________   2D echo 05/18/2021:  1. Left ventricular ejection fraction, by estimation, is 50 to 55%. The  left ventricle has low normal function. The left ventricle demonstrates  regional wall motion abnormalities (hypokinesis of the mid to distal  anteroseptal/septal, apical region There   is moderate left ventricular hypertrophy. Left ventricular diastolic  parameters are consistent with Grade I diastolic dysfunction (impaired  relaxation).   2. Right ventricular systolic function is normal. The right ventricular  size is normal. Tricuspid regurgitation signal is inadequate for assessing  PA pressure.   3. The mitral valve is normal in structure. Mild mitral valve  regurgitation. No evidence of mitral stenosis.   4. The aortic valve was not well visualized. Aortic valve regurgitation  is not visualized. Aortic valve sclerosis is present, with no evidence of  aortic valve stenosis.   5. The inferior vena cava is normal in size with greater than 50%  respiratory variability, suggesting right atrial pressure of 3 mmHg. __________   Eugenie Birks MPI 11/29/2020: Pharmacological myocardial perfusion imaging study with small region of ischemia in the apical wall on attenuation corrected images  This defect is not noted on non-attenuation corrected images, raising the concern for over processing. Normal wall motion, EF estimated at 43% (unable to exclude component of GI uptake artifact) No EKG  changes concerning for ischemia at peak stress or in recovery. Resting EKG with diffuse ST and T wave abnormality V3 through V6, I, II and aVL CT attenuation correction images with heavy coronary calcification in the LAD (stent), mild aortic atherosclerosis in the arch Low risk scan __________   [facility] 01/24/2020: There is mild left ventricular systolic dysfunction. The left ventricular ejection fraction is 45-50% by visual estimate. LV end diastolic pressure is moderately elevated. Ost RCA to Prox RCA lesion is 60% stenosed. Prox RCA to Mid RCA lesion is 40% stenosed. Mid LAD lesion is 90% stenosed. Post intervention, there is a 0% residual stenosis. A drug-eluting stent was successfully placed using a STENT RESOLUTE ONYX [id number]. Dist LAD lesion is 40% stenosed.   1.  Severe one-vessel coronary artery disease with 90% stenosis in the mid LAD which is the likely culprit for non-ST elevation myocardial infarction.  There is also moderate right coronary artery disease. 2.  Mildly reduced LV systolic function with an EF of 45% with moderate to severe anteroapical hypokinesis. 3.  Moderately elevated left ventricular end-diastolic pressure at 22 mmHg 4.  Successful angioplasty and drug-eluting stent placement to the mid LAD.   Recommendations: Dual antiplatelet therapy for at least 1 year.  Consult case manager for assistance with Brilinta.  Brilinta can be switched to clopidogrel or prasugrel after 1 month of treatment if cost is an issue. I switch metoprolol to carvedilol.  Resume ACE inhibitor if renal function returns to baseline. Smoking cessation. Cardiac rehab. Likely discharge home tomorrow. __________   2D echo 01/24/2020: 1. Left ventricular ejection fraction, by estimation, is 50%. The left  ventricle has low normal function. The left ventricle demonstrates  regional wall motion abnormalities (see scoring diagram/findings for  description). There is mild left ventricular   hypertrophy. Left ventricular diastolic parameters are consistent with  Grade I diastolic dysfunction (impaired relaxation). There is moderate  hypokinesis of the left ventricular, mid-apical anteroseptal wall.   2. Right ventricular systolic function is normal. The right ventricular  size is  normal.   3. The mitral valve is normal in structure. No evidence of mitral valve  regurgitation.   4. The aortic valve is tricuspid. Aortic valve regurgitation is not  visualized. Mild aortic valve sclerosis is present, with no evidence of  aortic valve stenosis. __________   2D echo 11/26/2019: 1. Left ventricular ejection fraction, by estimation, is 50 to 55%. The  left ventricle has low normal function. The left ventricle has no regional  wall motion abnormalities. Left ventricular diastolic parameters are  consistent with Grade I diastolic  dysfunction (impaired relaxation).   2. Right ventricular systolic function is normal. The right ventricular  size is normal.   3. The mitral valve is normal in structure. No evidence of mitral valve  regurgitation.   4. The aortic valve is calcified. Aortic valve regurgitation is not  visualized. Mild aortic valve sclerosis is present, with no evidence of  aortic valve stenosis.    EKG:  EKG is ordered today.  The EKG ordered today demonstrates ***  Recent Labs: 11/16/2021: ALT 20 11/17/2021: B Natriuretic Peptide 23.6 11/19/2021: BUN 15; Creatinine, Ser 1.16; Hemoglobin 15.3; Platelets 311; Potassium 3.8; Sodium 136  Recent Lipid Panel    Component Value Date/Time   CHOL 179 11/17/2021 1216   TRIG 284 (H) 11/17/2021 1216   HDL 29 (L) 11/17/2021 1216   CHOLHDL 6.2 11/17/2021 1216   VLDL 57 (H) 11/17/2021 1216   LDLCALC 93 11/17/2021 1216    PHYSICAL EXAM:    VS:  There were no vitals taken for this visit.  BMI: There is no height or weight on file to calculate BMI.  Physical Exam  Wt Readings from Last 3 Encounters:  11/21/21 225 lb  12.8 oz (102.4 kg)  11/18/21 220 lb (99.8 kg)  06/27/21 219 lb (99.3 kg)     ASSESSMENT & PLAN:   CAD involving the native coronary arteries with stable***angina:  HTN: Blood pressure  HLD: LDL 93 in 10/2021 with normal AST/ALT at that time.  Polysubstance use:  Cellulitis: Felt to be from IV site.  Medication nonadherence:   {Are you ordering a CV Procedure (e.g. stress test, cath, DCCV, TEE, etc)?   Press F2        :[id number]}     Disposition: F/u with Dr. Azucena Cecil or an APP in ***.   Medication Adjustments/Labs and Tests Ordered: Current medicines are reviewed at length with the patient today.  Concerns regarding medicines are outlined above. Medication changes, Labs and Tests ordered today are summarized above and listed in the Patient Instructions accessible in Encounters.   Signed, Eula Listen, PA-C 01/20/2022 2:56 PM     [facility] [place] [address] [place], [place] [address] [phone]

## 2022-01-24 ENCOUNTER — Ambulatory Visit: Payer: Self-pay | Attending: Cardiology | Admitting: Physician Assistant

## 2022-01-27 ENCOUNTER — Encounter: Payer: Self-pay | Admitting: Physician Assistant

## 2022-02-10 ENCOUNTER — Other Ambulatory Visit (HOSPITAL_BASED_OUTPATIENT_CLINIC_OR_DEPARTMENT_OTHER): Payer: Self-pay | Admitting: Osteopathic Medicine

## 2022-02-10 DIAGNOSIS — E785 Hyperlipidemia, unspecified: Secondary | ICD-10-CM

## 2022-02-10 DIAGNOSIS — I10 Essential (primary) hypertension: Secondary | ICD-10-CM

## 2022-02-11 ENCOUNTER — Inpatient Hospital Stay
Admission: EM | Admit: 2022-02-11 | Discharge: 2022-02-17 | DRG: 281 | Disposition: A | Discharge status: Other Acute Inpatient Hospital | Payer: Medicaid Other | Attending: Family Medicine | Admitting: Family Medicine

## 2022-02-11 ENCOUNTER — Other Ambulatory Visit: Payer: Self-pay

## 2022-02-11 ENCOUNTER — Emergency Department: Payer: Medicaid Other

## 2022-02-11 DIAGNOSIS — I214 Non-ST elevation (NSTEMI) myocardial infarction: Principal | ICD-10-CM | POA: Diagnosis present

## 2022-02-11 DIAGNOSIS — F1410 Cocaine abuse, uncomplicated: Secondary | ICD-10-CM | POA: Diagnosis not present

## 2022-02-11 DIAGNOSIS — I5032 Chronic diastolic (congestive) heart failure: Secondary | ICD-10-CM | POA: Diagnosis present

## 2022-02-11 DIAGNOSIS — Z91148 Patient's other noncompliance with medication regimen for other reason: Secondary | ICD-10-CM

## 2022-02-11 DIAGNOSIS — I252 Old myocardial infarction: Secondary | ICD-10-CM | POA: Diagnosis not present

## 2022-02-11 DIAGNOSIS — Z951 Presence of aortocoronary bypass graft: Secondary | ICD-10-CM | POA: Diagnosis not present

## 2022-02-11 DIAGNOSIS — Z720 Tobacco use: Secondary | ICD-10-CM | POA: Diagnosis present

## 2022-02-11 DIAGNOSIS — I255 Ischemic cardiomyopathy: Secondary | ICD-10-CM | POA: Diagnosis present

## 2022-02-11 DIAGNOSIS — N179 Acute kidney failure, unspecified: Secondary | ICD-10-CM | POA: Diagnosis not present

## 2022-02-11 DIAGNOSIS — T82855A Stenosis of coronary artery stent, initial encounter: Secondary | ICD-10-CM | POA: Diagnosis present

## 2022-02-11 DIAGNOSIS — Z87891 Personal history of nicotine dependence: Secondary | ICD-10-CM | POA: Diagnosis present

## 2022-02-11 DIAGNOSIS — I25110 Atherosclerotic heart disease of native coronary artery with unstable angina pectoris: Secondary | ICD-10-CM | POA: Diagnosis present

## 2022-02-11 DIAGNOSIS — Z1152 Encounter for screening for COVID-19: Secondary | ICD-10-CM | POA: Diagnosis not present

## 2022-02-11 DIAGNOSIS — I259 Chronic ischemic heart disease, unspecified: Secondary | ICD-10-CM

## 2022-02-11 DIAGNOSIS — I071 Rheumatic tricuspid insufficiency: Secondary | ICD-10-CM | POA: Diagnosis present

## 2022-02-11 DIAGNOSIS — I110 Hypertensive heart disease with heart failure: Secondary | ICD-10-CM | POA: Diagnosis present

## 2022-02-11 DIAGNOSIS — Z7902 Long term (current) use of antithrombotics/antiplatelets: Secondary | ICD-10-CM

## 2022-02-11 DIAGNOSIS — Z79899 Other long term (current) drug therapy: Secondary | ICD-10-CM

## 2022-02-11 DIAGNOSIS — I472 Ventricular tachycardia, unspecified: Secondary | ICD-10-CM | POA: Diagnosis not present

## 2022-02-11 DIAGNOSIS — Y831 Surgical operation with implant of artificial internal device as the cause of abnormal reaction of the patient, or of later complication, without mention of misadventure at the time of the procedure: Secondary | ICD-10-CM | POA: Diagnosis present

## 2022-02-11 DIAGNOSIS — Z8249 Family history of ischemic heart disease and other diseases of the circulatory system: Secondary | ICD-10-CM

## 2022-02-11 DIAGNOSIS — F1910 Other psychoactive substance abuse, uncomplicated: Secondary | ICD-10-CM | POA: Diagnosis present

## 2022-02-11 DIAGNOSIS — F17210 Nicotine dependence, cigarettes, uncomplicated: Secondary | ICD-10-CM | POA: Diagnosis present

## 2022-02-11 DIAGNOSIS — Z7982 Long term (current) use of aspirin: Secondary | ICD-10-CM | POA: Diagnosis not present

## 2022-02-11 DIAGNOSIS — I10 Essential (primary) hypertension: Secondary | ICD-10-CM | POA: Diagnosis present

## 2022-02-11 DIAGNOSIS — Z8616 Personal history of COVID-19: Secondary | ICD-10-CM | POA: Diagnosis not present

## 2022-02-11 DIAGNOSIS — R570 Cardiogenic shock: Secondary | ICD-10-CM | POA: Diagnosis not present

## 2022-02-11 DIAGNOSIS — E785 Hyperlipidemia, unspecified: Secondary | ICD-10-CM | POA: Diagnosis present

## 2022-02-11 LAB — CBC
HCT: 44 % (ref 39.0–52.0)
Hemoglobin: 15.1 g/dL (ref 13.0–17.0)
MCH: 30.2 pg (ref 26.0–34.0)
MCHC: 34.3 g/dL (ref 30.0–36.0)
MCV: 88 fL (ref 80.0–100.0)
Platelets: 325 10*3/uL (ref 150–400)
RBC: 5 MIL/uL (ref 4.22–5.81)
RDW: 13.8 % (ref 11.5–15.5)
WBC: 9.2 10*3/uL (ref 4.0–10.5)
nRBC: 0 % (ref 0.0–0.2)

## 2022-02-11 LAB — RESP PANEL BY RT-PCR (RSV, FLU A&B, COVID)  RVPGX2
Influenza A by PCR: NEGATIVE
Influenza B by PCR: NEGATIVE
Resp Syncytial Virus by PCR: NEGATIVE
SARS Coronavirus 2 by RT PCR: NEGATIVE

## 2022-02-11 LAB — BASIC METABOLIC PANEL
Anion gap: 8 (ref 5–15)
BUN: 13 mg/dL (ref 6–20)
CO2: 24 mmol/L (ref 22–32)
Calcium: 9.2 mg/dL (ref 8.9–10.3)
Chloride: 107 mmol/L (ref 98–111)
Creatinine, Ser: 1.34 mg/dL — ABNORMAL HIGH (ref 0.61–1.24)
GFR, Estimated: >60 mL/min (ref >60)
Glucose, Bld: 143 mg/dL — ABNORMAL HIGH (ref 70–99)
Potassium: 3.8 mmol/L (ref 3.5–5.1)
Sodium: 139 mmol/L (ref 135–145)

## 2022-02-11 LAB — PROTIME-INR
INR: 1.1 (ref 0.8–1.2)
Prothrombin Time: 13.9 seconds (ref 11.4–15.2)

## 2022-02-11 LAB — TROPONIN I (HIGH SENSITIVITY)
Troponin I (High Sensitivity): 256 ng/L (ref <18)
Troponin I (High Sensitivity): 429 ng/L (ref <18)

## 2022-02-11 LAB — APTT: aPTT: 28 seconds (ref 24–36)

## 2022-02-11 MED ORDER — MORPHINE SULFATE (PF) 2 MG/ML IV SOLN
2.0000 mg | INTRAVENOUS | Status: DC | PRN
  Administered 2022-02-13 – 2022-02-15 (×3): 2 mg via INTRAVENOUS
  Filled 2022-02-11 (×3): qty 1

## 2022-02-11 MED ORDER — CARVEDILOL 6.25 MG PO TABS
6.2500 mg | ORAL_TABLET | Freq: Two times a day (BID) | ORAL | Status: DC
  Administered 2022-02-12: 6.25 mg via ORAL
  Filled 2022-02-11: qty 1

## 2022-02-11 MED ORDER — ONDANSETRON HCL 4 MG/2ML IJ SOLN
4.0000 mg | Freq: Four times a day (QID) | INTRAMUSCULAR | Status: DC | PRN
  Filled 2022-02-11: qty 2

## 2022-02-11 MED ORDER — ALBUTEROL SULFATE (2.5 MG/3ML) 0.083% IN NEBU
2.5000 mL | INHALATION_SOLUTION | Freq: Four times a day (QID) | RESPIRATORY_TRACT | Status: DC | PRN
  Administered 2022-02-12: 2.5 mL via RESPIRATORY_TRACT
  Filled 2022-02-11: qty 3

## 2022-02-11 MED ORDER — ACETAMINOPHEN 325 MG PO TABS
650.0000 mg | ORAL_TABLET | ORAL | Status: DC | PRN
  Administered 2022-02-13: 650 mg via ORAL
  Filled 2022-02-11: qty 2

## 2022-02-11 MED ORDER — TICAGRELOR 90 MG PO TABS
90.0000 mg | ORAL_TABLET | Freq: Two times a day (BID) | ORAL | Status: DC
  Administered 2022-02-11 – 2022-02-13 (×4): 90 mg via ORAL
  Filled 2022-02-11 (×4): qty 1

## 2022-02-11 MED ORDER — ATORVASTATIN CALCIUM 80 MG PO TABS
80.0000 mg | ORAL_TABLET | Freq: Every day | ORAL | Status: DC
  Administered 2022-02-12 – 2022-02-17 (×6): 80 mg via ORAL
  Filled 2022-02-11 (×3): qty 1
  Filled 2022-02-11: qty 4
  Filled 2022-02-11: qty 1
  Filled 2022-02-11: qty 4

## 2022-02-11 MED ORDER — HEPARIN BOLUS VIA INFUSION
4000.0000 [IU] | Freq: Once | INTRAVENOUS | Status: AC
  Administered 2022-02-11: 4000 [IU] via INTRAVENOUS
  Filled 2022-02-11: qty 4000

## 2022-02-11 MED ORDER — NITROGLYCERIN IN D5W 200-5 MCG/ML-% IV SOLN
0.0000 ug/min | INTRAVENOUS | Status: DC
  Administered 2022-02-11: 5 ug/min via INTRAVENOUS
  Administered 2022-02-11: 15 ug/min via INTRAVENOUS
  Filled 2022-02-11: qty 250

## 2022-02-11 MED ORDER — ONDANSETRON HCL 4 MG PO TABS
4.0000 mg | ORAL_TABLET | Freq: Four times a day (QID) | ORAL | Status: DC | PRN
  Administered 2022-02-16: 4 mg via ORAL

## 2022-02-11 MED ORDER — ACETAMINOPHEN 650 MG RE SUPP: 650.0000 mg | Freq: Four times a day (QID) | RECTAL | Status: DC | PRN

## 2022-02-11 MED ORDER — ASPIRIN 81 MG PO TBEC
81.0000 mg | DELAYED_RELEASE_TABLET | Freq: Every day | ORAL | Status: DC
  Administered 2022-02-12 – 2022-02-17 (×6): 81 mg via ORAL
  Filled 2022-02-11 (×6): qty 1

## 2022-02-11 MED ORDER — HEPARIN (PORCINE) 25000 UT/250ML-% IV SOLN
1500.0000 [IU]/h | INTRAVENOUS | Status: DC
  Administered 2022-02-11: 1200 [IU]/h via INTRAVENOUS
  Administered 2022-02-12 – 2022-02-13 (×2): 1500 [IU]/h via INTRAVENOUS
  Filled 2022-02-11 (×3): qty 250

## 2022-02-11 MED ORDER — LOSARTAN POTASSIUM 25 MG PO TABS
25.0000 mg | ORAL_TABLET | Freq: Every day | ORAL | Status: DC
  Administered 2022-02-12 – 2022-02-15 (×4): 25 mg via ORAL
  Filled 2022-02-11 (×4): qty 1

## 2022-02-11 MED ORDER — ONDANSETRON HCL 4 MG/2ML IJ SOLN
4.0000 mg | Freq: Four times a day (QID) | INTRAMUSCULAR | Status: DC | PRN
  Administered 2022-02-12 – 2022-02-13 (×3): 4 mg via INTRAVENOUS
  Filled 2022-02-11 (×2): qty 2

## 2022-02-11 MED ORDER — PANTOPRAZOLE SODIUM 40 MG PO TBEC
40.0000 mg | DELAYED_RELEASE_TABLET | Freq: Every day | ORAL | Status: DC
  Administered 2022-02-12 – 2022-02-17 (×6): 40 mg via ORAL
  Filled 2022-02-11 (×6): qty 1

## 2022-02-11 MED ORDER — ACETAMINOPHEN 325 MG PO TABS
650.0000 mg | ORAL_TABLET | Freq: Four times a day (QID) | ORAL | Status: DC | PRN
  Administered 2022-02-13: 650 mg via ORAL

## 2022-02-11 NOTE — ED Triage Notes (Signed)
Pts CP started at 1600 today, he took 324 mg of aspirin prior to calling EMS. EMS gave him 4mg  of zofran and three sprays of nitro, last dose at 1722. His pain went from 10/10 to 0/10 after his last dose. Hx of 3 Mis, 3 stents.

## 2022-02-11 NOTE — Assessment & Plan Note (Signed)
Continue atorvastatin

## 2022-02-11 NOTE — ED Notes (Signed)
MD aware of pts elevated trop

## 2022-02-11 NOTE — Assessment & Plan Note (Addendum)
CAD s/p PCI 04/2021 and 11/18/21   cath on 9/25 showing significant two-vessel coronary artery disease treated medically  Continue heparin infusion Continue nitroglycerin infusion started in the ED and hold home Imdur Continue carvedilol, aspirin, Brilinta and atorvastatin Cardiology consult We will keep n.p.o. tonight

## 2022-02-11 NOTE — Consult Note (Addendum)
ANTICOAGULATION CONSULT NOTE - Initial Consult  Pharmacy Consult for heparin Indication: chest pain/ACS  No Known Allergies  Patient Measurements: Height: 6' (182.9 cm) Weight: 95.3 kg (210 lb) IBW/kg (Calculated) : 77.6 Heparin Dosing Weight: 95.3  Vital Signs: Temp: 97.6 F (36.4 C) (12/19 1747) Temp Source: Oral (12/19 1747) BP: 163/113 (12/19 1859) Pulse Rate: 105 (12/19 1859)  Labs: Recent Labs    02/11/22 1800  HGB 15.1  HCT 44.0  PLT 325  CREATININE 1.34*  TROPONINIHS 256*    Estimated Creatinine Clearance: 80.8 mL/min (A) (by C-G formula based on SCr of 1.34 mg/dL (H)).   Medical History: Past Medical History:  Diagnosis Date   CAD (coronary artery disease)    a. 11/2019 NSTEMI/PCI: LM min irregs, LAD 38m (2.75x22 Resolute Onyx DES), 40d, D2 min irregs, LCX/LPAV min irregs, RCA 60ost, 40p/m, EF 45-50%; b. 11/2020 MV: EF 43%, small area of apical ischemia ->? over processing-->low risk.   Hyperlipidemia    Hypertension    Ischemic cardiomyopathy    a. 11/2019 LV Gram: EF 45-50%; b. 12/2019 Echo: EF 50% w/ mod mid-apical anteroseptal HK, GrI DD, nl RV fxn, mild Ao sclerosis.   Tobacco use 11/25/2019    Medications: No chronic anticoag PTA Aspirin 81 mg, Ticagrelor 90 mg  Assessment: 46 yo M with PMH HTN, HLD, CAD, NSTEMI (12/2019), unstable angina presents with chest pain. Troponins elevated at 256 and ECG shows possible STEMI, infarcts of indeterminate age, and sinus tachycardia. No chronic anticoag PTA noted from chart review. Hgb, Hct, PLT stable.  Date Time aPTT/HL Rate/Comment       Baseline Labs: aPTT - 28; INR - 1.1 Hgb - 15.1; Plts - 325  Goal of Therapy:  Heparin level 0.3-0.7 units/ml Monitor platelets by anticoagulation protocol: Yes   Plan:  Give 4000 units bolus x1; then start heparin infusion at 1200 units/hr. Check anti-Xa level in 6 hours and daily once consecutively therapeutic. Continue to monitor H&H and platelets daily while  on heparin gtt.   Will M. Dareen Piano, PharmD PGY-1 Pharmacy Resident 02/11/2022 7:12 PM

## 2022-02-11 NOTE — Assessment & Plan Note (Signed)
Continue losartan and carvedilol

## 2022-02-11 NOTE — ED Provider Notes (Signed)
[facility] Provider Note   Event Date/Time   First MD Initiated Contact with Patient 02/11/22 1740     (approximate) History  Chest Pain  HPI Logan Crawford is a 46 y.o. male with a past medical history of CAD and status post stent placement last year who presents for worsening chest pain via EMS.  Patient states that this pain is not present over the last couple hours with no relieving symptoms except for aspirin and nitroglycerin that was given by EMS prior to arrival.  Patient states that his initial chest pain was 10/10 that reduced to 0/10 after the nitroglycerin however it is starting to come back at this time and is currently 3/10.  Patient does endorse worsening chest pain with exertion and states that this does feel similar to previous episodes of angina that he has had in the past. ROS: Patient currently denies any vision changes, tinnitus, difficulty speaking, facial droop, sore throat, shortness of breath, abdominal pain, nausea/vomiting/diarrhea, dysuria, or weakness/numbness/paresthesias in any extremity   Physical Exam  Triage Vital Signs: ED Triage Vitals  Enc Vitals Group     BP      Pulse      Resp      Temp      Temp src      SpO2      Weight      Height      Head Circumference      Peak Flow      Pain Score      Pain Loc      Pain Edu?      Excl. in GC?    Most recent vital signs: Vitals:   02/11/22 2100 02/11/22 2332  BP: (!) 149/98 (!) 159/102  Pulse: 95 97  Resp: 17 15  Temp:  (!) 97.5 F (36.4 C)  SpO2:  98%   General: Awake, oriented x4. CV:  Good peripheral perfusion.  Resp:  Normal effort.  Abd:  No distention.  Other:  Middle-aged Caucasian male laying in bed in no acute distress ED Results / Procedures / Treatments  Labs (all labs ordered are listed, but only abnormal results are displayed) Labs Reviewed  BASIC METABOLIC PANEL - Abnormal; Notable for the following components:      Result Value   Glucose, Bld  143 (*)    Creatinine, Ser 1.34 (*)    All other components within normal limits  TROPONIN I (HIGH SENSITIVITY) - Abnormal; Notable for the following components:   Troponin I (High Sensitivity) 256 (*)    All other components within normal limits  TROPONIN I (HIGH SENSITIVITY) - Abnormal; Notable for the following components:   Troponin I (High Sensitivity) 429 (*)    All other components within normal limits  RESP PANEL BY RT-PCR (RSV, FLU A&B, COVID)  RVPGX2  CBC  APTT  PROTIME-INR  HEPARIN LEVEL (UNFRACTIONATED)  CBC   EKG ED ECG REPORT I, Merwyn Katos, the attending physician, personally viewed and interpreted this ECG. Date: 02/11/2022 EKG Time: 1808 Rate: 105 Rhythm: Tachycardic sinus rhythm QRS Axis: normal Intervals: normal ST/T Wave abnormalities: normal Narrative Interpretation: Tachycardic sinus rhythm.  No evidence of acute ischemia RADIOLOGY ED MD interpretation: One-view portable chest x-ray interpreted by me shows no evidence of acute abnormalities including no pneumonia, pneumothorax, or widened mediastinum -Agree with radiology assessment Official radiology report(s): DG Chest Port 1 View  Result Date: 02/11/2022 CLINICAL DATA:  Chest pain. EXAM: PORTABLE CHEST 1  VIEW COMPARISON:  November 16, 2021. FINDINGS: The heart size and mediastinal contours are within normal limits. Both lungs are clear. The visualized skeletal structures are unremarkable. IMPRESSION: No active disease. Electronically Signed   By: Lupita Raider M.D.   On: 02/11/2022 18:27   PROCEDURES: Critical Care performed: Yes, see critical care procedure note(s) .1-3 Lead EKG Interpretation  Performed by: Merwyn Katos, MD Authorized by: Merwyn Katos, MD     Interpretation: normal     ECG rate:  96   ECG rate assessment: normal     Rhythm: sinus rhythm     Ectopy: none     Conduction: normal   CRITICAL CARE Performed by: Merwyn Katos  Total critical care time: 33  minutes  Critical care time was exclusive of separately billable procedures and treating other patients.  Critical care was necessary to treat or prevent imminent or life-threatening deterioration.  Critical care was time spent personally by me on the following activities: development of treatment plan with patient and/or surrogate as well as nursing, discussions with consultants, evaluation of patient's response to treatment, examination of patient, obtaining history from patient or surrogate, ordering and performing treatments and interventions, ordering and review of laboratory studies, ordering and review of radiographic studies, pulse oximetry and re-evaluation of patient's condition.  MEDICATIONS ORDERED IN ED: Medications  nitroGLYCERIN 50 mg in dextrose 5 % 250 mL (0.2 mg/mL) infusion (20 mcg/min Intravenous Infusion Verify 02/11/22 2333)  heparin ADULT infusion 100 units/mL (25000 units/267mL) (1,200 Units/hr Intravenous Infusion Verify 02/11/22 2333)  aspirin EC tablet 81 mg (has no administration in time range)  atorvastatin (LIPITOR) tablet 80 mg (has no administration in time range)  carvedilol (COREG) tablet 6.25 mg (has no administration in time range)  losartan (COZAAR) tablet 25 mg (has no administration in time range)  pantoprazole (PROTONIX) EC tablet 40 mg (has no administration in time range)  ticagrelor (BRILINTA) tablet 90 mg (90 mg Oral Given 02/11/22 2217)  albuterol (PROVENTIL) (2.5 MG/3ML) 0.083% nebulizer solution 2.5 mL (has no administration in time range)  acetaminophen (TYLENOL) tablet 650 mg (has no administration in time range)  ondansetron (ZOFRAN) injection 4 mg (has no administration in time range)  acetaminophen (TYLENOL) tablet 650 mg (has no administration in time range)    Or  acetaminophen (TYLENOL) suppository 650 mg (has no administration in time range)  ondansetron (ZOFRAN) tablet 4 mg (has no administration in time range)    Or  ondansetron  (ZOFRAN) injection 4 mg (has no administration in time range)  morphine (PF) 2 MG/ML injection 2 mg (has no administration in time range)  heparin bolus via infusion 4,000 Units (4,000 Units Intravenous Bolus from Bag 02/11/22 1916)   IMPRESSION / MDM / ASSESSMENT AND PLAN / ED COURSE  I reviewed the triage vital signs and the nursing notes.                             The patient is on the cardiac monitor to evaluate for evidence of arrhythmia and/or significant heart rate changes. Patient's presentation is most consistent with acute presentation with potential threat to life or bodily function. Workup: ECG, CXR, CBC, CMP, Troponin Findings: ECG: No overt evidence of STEMI. No evidence of Brugadas sign, delta wave, epsilon wave, significantly prolonged QTc, or malignant arrhythmia Troponin: 256 Other Labs unremarkable for emergent problems. CXR: Without PTX, PNA, or widened mediastinum Last Heart Catheterization:  10/2021 HEART  Score: 5  Given History, Exam, and Workup concern for NSTEMI.  I have low suspicion for Pneumothorax, Pneumonia, Pulmonary Embolus, Tamponade, Aortic Dissection  Interventions: ASA 324or325mg  Heparin Bolus 60-70u/kg (max 5000) Heparin gtt about 12-15u/kg/hr (max 1000/hr) PRN analgesia with fentanyl, morphine PRN antiemetic therapy  Dispo: Admit   FINAL CLINICAL IMPRESSION(S) / ED DIAGNOSES   Final diagnoses:  NSTEMI (non-ST elevated myocardial infarction) (HCC)  Chest pain due to myocardial ischemia, unspecified ischemic chest pain type  Hypertension, unspecified type   Rx / DC Orders   ED Discharge Orders     None      Note:  This document was prepared using Dragon voice recognition software and may include unintentional dictation errors.   Merwyn Katos, MD 02/11/22 [phone]

## 2022-02-11 NOTE — ED Notes (Signed)
EKG initally given to MD when pt arrived. At that time his chest pain was a 0/10. While in the room with the pt drawing blood, he stated that his chest pain increased to a 6/10. EKG done again and given to MD. MD notified that the pt received 324mg  of asprin, 3 nitrosprays with pain relief and 4 of zofran prior to arrival. Blood sent to lab. Cont. Cardiac monitor.

## 2022-02-11 NOTE — IPAL (Signed)
  Interdisciplinary Goals of Care Family Meeting   Date carried out: 02/11/2022  Location of the meeting: Bedside  Member's involved: Physician  Durable Power of Attorney or acting medical decision maker: patient    Discussion: We discussed goals of care for Logan Crawford .    Code status:   Code Status: Full Code   Disposition: Continue current acute care  Time spent for the meeting: 16     Andris Baumann, MD  02/11/2022, 11:21 PM

## 2022-02-11 NOTE — Assessment & Plan Note (Signed)
Clinically euvolemic Echo 11/18/2021 showing G1 DD, EF 60 to 65% Continue Coreg and losartan

## 2022-02-11 NOTE — Assessment & Plan Note (Addendum)
States it has been 6 weeks since he used cocaine

## 2022-02-11 NOTE — Assessment & Plan Note (Addendum)
Down to 2 cigarettes/day

## 2022-02-11 NOTE — H&P (Signed)
History and Physical    Patient: Logan Crawford TIR:[id number] DOB: [birth year] DOA: 02/11/2022 DOS: the patient was seen and examined on 02/11/2022 PCP: Pcp, No  Patient coming from: Home  Chief Complaint:  Chief Complaint  Patient presents with   Chest Pain    HPI: Logan Crawford is a 46 y.o. male with medical history significant for HTN, polysubstance abuse, tobacco use disorder, CAD s/p PCI 04/2021, G1 DD, EF 60-65 10/2021, hospitalized from 9/23 to 9/26 with NSTEMI for which she underwent repeat cardiac cath on 9/25 showing significant two-vessel coronary artery disease treated medically and on DAPT, who presents To the ED with chest pain typical to prior NSTEMI.  EMS administered aspirin and sublingual nitroglycerin which relieved his pain but after 20 minutes the pain recurred.  He had associated nausea but denied shortness of breath, fever or chills, lightheadedness or palpitations. ED course and data review: Afebrile, BP 173/114 on arrival with pulse 106 respirations 20 and O2 sat 97% on room air. Troponin [phone].  CBC and BMP unremarkable.  EKG, personally viewed and interpreted showing sinus tachycardia at 105 with ST depression D2-D3 aVF.  Chest x-ray shows no acute disease. The ED provider spoke with cardiology fellow, Dr. Elayne Guerin on call for [facility] who recommended heparin infusion as well as nitroglycerin infusion given recurrence of chest pain on arrival.  Hospitalist consulted for admission.   Review of Systems: As mentioned in the history of present illness. All other systems reviewed and are negative.  Past Medical History:  Diagnosis Date   CAD (coronary artery disease)    a. 11/2019 NSTEMI/PCI: LM min irregs, LAD 3m (2.75x22 Resolute Onyx DES), 40d, D2 min irregs, LCX/LPAV min irregs, RCA 60ost, 40p/m, EF 45-50%; b. 11/2020 MV: EF 43%, small area of apical ischemia ->? over processing-->low risk.   Hyperlipidemia    Hypertension    Ischemic cardiomyopathy    a.  11/2019 LV Gram: EF 45-50%; b. 12/2019 Echo: EF 50% w/ mod mid-apical anteroseptal HK, GrI DD, nl RV fxn, mild Ao sclerosis.   Tobacco use 11/25/2019   Past Surgical History:  Procedure Laterality Date   CORONARY STENT INTERVENTION N/A 01/24/2020   Procedure: CORONARY STENT INTERVENTION;  Surgeon: Iran Ouch, MD;  Location: [facility] INVASIVE CV LAB;  Service: Cardiovascular;  Laterality: N/A;   CORONARY STENT INTERVENTION N/A 05/20/2021   Procedure: CORONARY STENT INTERVENTION;  Surgeon: Iran Ouch, MD;  Location: [facility] INVASIVE CV LAB;  Service: Cardiovascular;  Laterality: N/A;   INTRAVASCULAR ULTRASOUND/IVUS N/A 05/20/2021   Procedure: Intravascular Ultrasound/IVUS;  Surgeon: Iran Ouch, MD;  Location: [facility] INVASIVE CV LAB;  Service: Cardiovascular;  Laterality: N/A;   IRRIGATION AND DEBRIDEMENT KNEE Left    LEFT HEART CATH AND CORONARY ANGIOGRAPHY N/A 01/24/2020   Procedure: LEFT HEART CATH AND CORONARY ANGIOGRAPHY;  Surgeon: Iran Ouch, MD;  Location: [facility] INVASIVE CV LAB;  Service: Cardiovascular;  Laterality: N/A;   LEFT HEART CATH AND CORONARY ANGIOGRAPHY N/A 11/18/2021   Procedure: LEFT HEART CATH AND CORONARY ANGIOGRAPHY;  Surgeon: Iran Ouch, MD;  Location: [facility] INVASIVE CV LAB;  Service: Cardiovascular;  Laterality: N/A;   LEFT HEART CATH AND CORS/GRAFTS ANGIOGRAPHY N/A 05/20/2021   Procedure: LEFT HEART CATH AND CORS/GRAFTS ANGIOGRAPHY;  Surgeon: Iran Ouch, MD;  Location: [facility] INVASIVE CV LAB;  Service: Cardiovascular;  Laterality: N/A;   TYMPANOSTOMY TUBE PLACEMENT Bilateral    Social History:  reports that he quit smoking about 2 years ago. His smoking use included  cigarettes. He has a 22.00 pack-year smoking history. He has never used smokeless tobacco. He reports current alcohol use of about 1.0 standard drink of alcohol per week. He reports that he does not use drugs.  No Known Allergies  Family History  Problem Relation Age of Onset    Coronary artery disease Mother    Arrhythmia Mother        Pacemaker placed in 2021   Heart failure Mother    Coronary artery disease Father     Prior to Admission medications   Medication Sig Start Date End Date Taking? Authorizing Provider  aspirin EC 81 MG tablet Take 1 tablet (81 mg total) by mouth daily. Swallow whole. 11/19/21  Yes Sunnie Nielsen, DO  carvedilol (COREG) 6.25 MG tablet Take 1 tablet (6.25 mg total) by mouth 2 (two) times daily with a meal. 11/19/21  Yes Sunnie Nielsen, DO  isosorbide mononitrate (IMDUR) 30 MG 24 hr tablet Take 3 tablets (90 mg total) by mouth daily. 11/19/21 02/11/22 Yes Sunnie Nielsen, DO  ticagrelor (BRILINTA) 90 MG TABS tablet Take one tablet by mouth every 12 hours 12/06/21  Yes   albuterol (VENTOLIN HFA) 108 (90 Base) MCG/ACT inhaler Inhale 2 puffs into the lungs every 6 (six) hours as needed for wheezing or shortness of breath. 11/19/21 02/17/22  Sunnie Nielsen, DO  amLODipine (NORVASC) 10 MG tablet Take 0.5 tablets (5 mg total) by mouth daily. Patient not taking: Reported on 02/11/2022 11/19/21   Sunnie Nielsen, DO  atorvastatin (LIPITOR) 80 MG tablet Take 1 tablet (80 mg total) by mouth daily. Patient not taking: Reported on 02/11/2022 11/19/21   Sunnie Nielsen, DO  cephALEXin (KEFLEX) 500 MG capsule Take 1 capsule (500 mg total) by mouth 2 (two) times daily. Patient not taking: Reported on 02/11/2022 11/21/21   Sondra Barges, PA-C  losartan (COZAAR) 50 MG tablet Take 0.5 tablets (25 mg total) by mouth daily. Patient not taking: Reported on 02/11/2022 11/19/21   Sunnie Nielsen, DO  Multiple Vitamin (MULTIVITAMIN) tablet Take 1 tablet by mouth daily. Patient not taking: Reported on 02/11/2022 11/19/21   Sunnie Nielsen, DO  nitroGLYCERIN (NITROSTAT) 0.4 MG SL tablet Place 1 tablet (0.4 mg total) under the tongue every 5 (five) minutes as needed for chest pain (if chest pain not resolved after 3 doses, please call 911). 11/19/21    Sunnie Nielsen, DO  pantoprazole (PROTONIX) 40 MG tablet Take 1 tablet (40 mg total) by mouth daily. Patient not taking: Reported on 02/11/2022 11/19/21 02/17/22  Sunnie Nielsen, DO    Physical Exam: Vitals:   02/11/22 1919 02/11/22 1924 02/11/22 1941 02/11/22 2054  BP: (!) 132/91 (!) 158/98 (!) 151/97 (!) 134/90  Pulse: (!) 102 (!) 102 97 93  Resp: (!) 21 16 11 17   Temp:      TempSrc:      SpO2: 97% 99%  98%  Weight:      Height:       Physical Exam Vitals and nursing note reviewed.  Constitutional:      General: He is not in acute distress. HENT:     Head: Normocephalic and atraumatic.  Cardiovascular:     Rate and Rhythm: Normal rate and regular rhythm.     Heart sounds: Normal heart sounds.  Pulmonary:     Effort: Pulmonary effort is normal.     Breath sounds: Normal breath sounds.  Abdominal:     Palpations: Abdomen is soft.     Tenderness: There is no abdominal tenderness.  Neurological:     Mental Status: Mental status is at baseline.     Labs on Admission: I have personally reviewed following labs and imaging studies  CBC: Recent Labs  Lab 02/11/22 1800  WBC 9.2  HGB 15.1  HCT 44.0  MCV 88.0  PLT 325   Basic Metabolic Panel: Recent Labs  Lab 02/11/22 1800  NA 139  K 3.8  CL 107  CO2 24  GLUCOSE 143*  BUN 13  CREATININE 1.34*  CALCIUM 9.2   GFR: Estimated Creatinine Clearance: 80.8 mL/min (A) (by C-G formula based on SCr of 1.34 mg/dL (H)). Liver Function Tests: No results for input(s): "AST", "ALT", "ALKPHOS", "BILITOT", "PROT", "ALBUMIN" in the last 168 hours. No results for input(s): "LIPASE", "AMYLASE" in the last 168 hours. No results for input(s): "AMMONIA" in the last 168 hours. Coagulation Profile: Recent Labs  Lab 02/11/22 1800  INR 1.1   Cardiac Enzymes: No results for input(s): "CKTOTAL", "CKMB", "CKMBINDEX", "TROPONINI" in the last 168 hours. BNP (last 3 results) No results for input(s): "PROBNP" in the last 8760  hours. HbA1C: No results for input(s): "HGBA1C" in the last 72 hours. CBG: No results for input(s): "GLUCAP" in the last 168 hours. Lipid Profile: No results for input(s): "CHOL", "HDL", "LDLCALC", "TRIG", "CHOLHDL", "LDLDIRECT" in the last 72 hours. Thyroid Function Tests: No results for input(s): "TSH", "T4TOTAL", "FREET4", "T3FREE", "THYROIDAB" in the last 72 hours. Anemia Panel: No results for input(s): "VITAMINB12", "FOLATE", "FERRITIN", "TIBC", "IRON", "RETICCTPCT" in the last 72 hours. Urine analysis:    Component Value Date/Time   COLORURINE YELLOW (A) 11/25/2019 1759   APPEARANCEUR CLEAR (A) 11/25/2019 1759   APPEARANCEUR Clear 06/30/2013 1513   LABSPEC 1.018 11/25/2019 1759   LABSPEC 1.015 06/30/2013 1513   PHURINE 5.0 11/25/2019 1759   GLUCOSEU NEGATIVE 11/25/2019 1759   GLUCOSEU Negative 06/30/2013 1513   HGBUR SMALL (A) 11/25/2019 1759   BILIRUBINUR NEGATIVE 11/25/2019 1759   BILIRUBINUR Negative 06/30/2013 1513   KETONESUR NEGATIVE 11/25/2019 1759   PROTEINUR 30 (A) 11/25/2019 1759   NITRITE NEGATIVE 11/25/2019 1759   LEUKOCYTESUR NEGATIVE 11/25/2019 1759   LEUKOCYTESUR Negative 06/30/2013 1513    Radiological Exams on Admission: DG Chest Port 1 View  Result Date: 02/11/2022 CLINICAL DATA:  Chest pain. EXAM: PORTABLE CHEST 1 VIEW COMPARISON:  November 16, 2021. FINDINGS: The heart size and mediastinal contours are within normal limits. Both lungs are clear. The visualized skeletal structures are unremarkable. IMPRESSION: No active disease. Electronically Signed   By: Lupita RaiderJames  Green Jr M.D.   On: 02/11/2022 18:27     Data Reviewed: Relevant notes from primary care and specialist visits, past discharge summaries as available in EHR, including Care Everywhere. Prior diagnostic testing as pertinent to current admission diagnoses Updated medications and problem lists for reconciliation ED course, including vitals, labs, imaging, treatment and response to  treatment Triage notes, nursing and pharmacy notes and ED provider's notes Notable results as noted in HPI   Assessment and Plan: * NSTEMI (non-ST elevated myocardial infarction) (HCC) CAD s/p PCI 04/2021 and 11/18/21   cath on 9/25 showing significant two-vessel coronary artery disease treated medically  Continue heparin infusion Continue nitroglycerin infusion started in the ED and hold home Imdur Continue carvedilol, aspirin, Brilinta and atorvastatin Cardiology consult We will keep n.p.o. tonight  Polysubstance abuse (HCC) States it has been 6 weeks since he used cocaine  Essential hypertension Continue losartan and carvedilol  Hyperlipidemia Continue atorvastatin  Tobacco use Down to 2 cigarettes/day  Chronic  diastolic CHF (congestive heart failure) (HCC) Clinically euvolemic Echo 11/18/2021 showing G1 DD, EF 60 to 65% Continue Coreg and losartan        DVT prophylaxis: Heparin infusion  Consults: Cardiology, Dr. Jodelle Red  Advance Care Planning:   Code Status: Prior   Family Communication: none  Disposition Plan: Back to previous home environment  Severity of Illness: The appropriate patient status for this patient is INPATIENT. Inpatient status is judged to be reasonable and necessary in order to provide the required intensity of service to ensure the patient's safety. The patient's presenting symptoms, physical exam findings, and initial radiographic and laboratory data in the context of their chronic comorbidities is felt to place them at high risk for further clinical deterioration. Furthermore, it is not anticipated that the patient will be medically stable for discharge from the hospital within 2 midnights of admission.   * I certify that at the point of admission it is my clinical judgment that the patient will require inpatient hospital care spanning beyond 2 midnights from the point of admission due to high intensity of service, high risk for  further deterioration and high frequency of surveillance required.*  Author: Andris Baumann, MD 02/11/2022 9:06 PM  For on call review [email or web address].

## 2022-02-12 ENCOUNTER — Inpatient Hospital Stay (HOSPITAL_COMMUNITY)
Admit: 2022-02-12 | Discharge: 2022-02-12 | Disposition: A | Discharge status: Home / Self Care | Payer: Medicaid Other | Attending: Cardiology | Admitting: Cardiology

## 2022-02-12 ENCOUNTER — Other Ambulatory Visit: Payer: Self-pay

## 2022-02-12 ENCOUNTER — Encounter: Payer: Self-pay | Admitting: Internal Medicine

## 2022-02-12 DIAGNOSIS — I214 Non-ST elevation (NSTEMI) myocardial infarction: Secondary | ICD-10-CM

## 2022-02-12 DIAGNOSIS — Z720 Tobacco use: Secondary | ICD-10-CM

## 2022-02-12 DIAGNOSIS — I10 Essential (primary) hypertension: Secondary | ICD-10-CM

## 2022-02-12 LAB — CBC
HCT: 42.5 % (ref 39.0–52.0)
Hemoglobin: 14.5 g/dL (ref 13.0–17.0)
MCH: 30.3 pg (ref 26.0–34.0)
MCHC: 34.1 g/dL (ref 30.0–36.0)
MCV: 88.7 fL (ref 80.0–100.0)
Platelets: 295 10*3/uL (ref 150–400)
RBC: 4.79 MIL/uL (ref 4.22–5.81)
RDW: 13.7 % (ref 11.5–15.5)
WBC: 8 10*3/uL (ref 4.0–10.5)
nRBC: 0 % (ref 0.0–0.2)

## 2022-02-12 LAB — HEPARIN LEVEL (UNFRACTIONATED)
Heparin Unfractionated: 0.19 IU/mL — ABNORMAL LOW (ref 0.30–0.70)
Heparin Unfractionated: 0.38 IU/mL (ref 0.30–0.70)
Heparin Unfractionated: 0.41 IU/mL (ref 0.30–0.70)

## 2022-02-12 LAB — URINE DRUG SCREEN, QUALITATIVE (ARMC ONLY)
Amphetamines, Ur Screen: NOT DETECTED
Barbiturates, Ur Screen: NOT DETECTED
Benzodiazepine, Ur Scrn: NOT DETECTED
Cannabinoid 50 Ng, Ur ~~LOC~~: NOT DETECTED
Cocaine Metabolite,Ur ~~LOC~~: POSITIVE — AB
MDMA (Ecstasy)Ur Screen: NOT DETECTED
Methadone Scn, Ur: NOT DETECTED
Opiate, Ur Screen: NOT DETECTED
Phencyclidine (PCP) Ur S: NOT DETECTED
Tricyclic, Ur Screen: NOT DETECTED

## 2022-02-12 LAB — ECHOCARDIOGRAM LIMITED
Height: 72 in
S' Lateral: 2.5 cm
Weight: 3360 oz

## 2022-02-12 LAB — TROPONIN I (HIGH SENSITIVITY): Troponin I (High Sensitivity): 839 ng/L (ref <18)

## 2022-02-12 MED ORDER — SODIUM CHLORIDE 0.9% FLUSH: 3.0000 mL | INTRAVENOUS | Status: DC | PRN

## 2022-02-12 MED ORDER — CARVEDILOL 12.5 MG PO TABS
12.5000 mg | ORAL_TABLET | Freq: Two times a day (BID) | ORAL | Status: DC
  Administered 2022-02-13 – 2022-02-15 (×4): 12.5 mg via ORAL
  Filled 2022-02-12: qty 2
  Filled 2022-02-12 (×3): qty 1

## 2022-02-12 MED ORDER — ASPIRIN 81 MG PO CHEW
81.0000 mg | CHEWABLE_TABLET | ORAL | Status: AC
  Administered 2022-02-13: 81 mg via ORAL
  Filled 2022-02-12: qty 1

## 2022-02-12 MED ORDER — SODIUM CHLORIDE 0.9 % WEIGHT BASED INFUSION
1.0000 mL/kg/h | INTRAVENOUS | Status: DC
  Administered 2022-02-13: 1 mL/kg/h via INTRAVENOUS

## 2022-02-12 MED ORDER — HEPARIN BOLUS VIA INFUSION
2800.0000 [IU] | Freq: Once | INTRAVENOUS | Status: AC
  Administered 2022-02-12: 2800 [IU] via INTRAVENOUS
  Filled 2022-02-12: qty 2800

## 2022-02-12 MED ORDER — SODIUM CHLORIDE 0.9 % IV SOLN: 250.0000 mL | INTRAVENOUS | Status: DC | PRN

## 2022-02-12 MED ORDER — ISOSORBIDE MONONITRATE ER 60 MG PO TB24
60.0000 mg | ORAL_TABLET | Freq: Every day | ORAL | Status: DC
  Administered 2022-02-12 – 2022-02-15 (×4): 60 mg via ORAL
  Filled 2022-02-12 (×4): qty 1

## 2022-02-12 MED ORDER — SODIUM CHLORIDE 0.9 % WEIGHT BASED INFUSION
3.0000 mL/kg/h | INTRAVENOUS | Status: AC
  Administered 2022-02-13: 3 mL/kg/h via INTRAVENOUS

## 2022-02-12 MED ORDER — CARVEDILOL 6.25 MG PO TABS
6.2500 mg | ORAL_TABLET | Freq: Once | ORAL | Status: AC
  Administered 2022-02-12: 6.25 mg via ORAL
  Filled 2022-02-12: qty 1

## 2022-02-12 MED ORDER — SODIUM CHLORIDE 0.9% FLUSH
3.0000 mL | Freq: Two times a day (BID) | INTRAVENOUS | Status: DC
  Administered 2022-02-13 (×2): 3 mL via INTRAVENOUS

## 2022-02-12 NOTE — ED Notes (Signed)
Pt remains NPO since midnight per H&P note pending cardiology consult.

## 2022-02-12 NOTE — Consult Note (Signed)
ANTICOAGULATION CONSULT NOTE - Initial Consult  Pharmacy Consult for heparin Indication: chest pain/ACS  No Known Allergies  Patient Measurements: Height: 6' (182.9 cm) Weight: 95.3 kg (210 lb) IBW/kg (Calculated) : 77.6 Heparin Dosing Weight: 95.3  Vital Signs: Temp: 97.4 F (36.3 C) (12/20 0920) Temp Source: Oral (12/20 0920) BP: 160/106 (12/20 0900) Pulse Rate: 81 (12/20 0930)  Labs: Recent Labs    02/11/22 1800 02/11/22 2018 02/12/22 0149 02/12/22 0540 02/12/22 0855  HGB 15.1  --   --  14.5  --   HCT 44.0  --   --  42.5  --   PLT 325  --   --  295  --   APTT 28  --   --   --   --   LABPROT 13.9  --   --   --   --   INR 1.1  --   --   --   --   HEPARINUNFRC  --   --  0.19*  --  0.38  CREATININE 1.34*  --   --   --   --   TROPONINIHS 256* 429*  --   --  839*     Estimated Creatinine Clearance: 80.8 mL/min (A) (by C-G formula based on SCr of 1.34 mg/dL (H)).   Medical History: Past Medical History:  Diagnosis Date   CAD (coronary artery disease)    a. 11/2019 NSTEMI/PCI: LM min irregs, LAD 58m (2.75x22 Resolute Onyx DES), 40d, D2 min irregs, LCX/LPAV min irregs, RCA 60ost, 40p/m, EF 45-50%; b. 11/2020 MV: EF 43%, small area of apical ischemia ->? over processing-->low risk.   Hyperlipidemia    Hypertension    Ischemic cardiomyopathy    a. 11/2019 LV Gram: EF 45-50%; b. 12/2019 Echo: EF 50% w/ mod mid-apical anteroseptal HK, GrI DD, nl RV fxn, mild Ao sclerosis.   Tobacco use 11/25/2019    Medications: No chronic anticoag PTA Aspirin 81 mg, Ticagrelor 90 mg  Assessment: 46 yo M with PMH HTN, HLD, CAD, NSTEMI (12/2019), unstable angina presents with chest pain. Troponins elevated at 256 and ECG shows possible STEMI, infarcts of indeterminate age, and sinus tachycardia. No chronic anticoag PTA noted from chart review. Hgb, Hct, PLT stable.  Date Time aPTT/HL Rate/Comment 12/20 0149 HL 0.19 Subtherapeutic  12/20 0855 HL 0.38 Therapeutic x 1 @ 1500  units/hr     Baseline Labs: aPTT - 28; INR - 1.1 Hgb - 15.1; Plts - 325  Goal of Therapy:  Heparin level 0.3-0.7 units/ml Monitor platelets by anticoagulation protocol: Yes   Plan:  Heparin therapeutic x 1  Continue heparin infusion at 1500 units/hr.  Will recheck HL in 6 hours to confirm  Sharen Hones, PharmD, BCPS Clinical Pharmacist   02/12/2022 9:50 AM

## 2022-02-12 NOTE — Progress Notes (Signed)
[facility]  Date: 02/12/22  Time: 4:39 PM  Patient referred for cardiac catheterization in the setting of NSTEMI.  Due to delays in cath lab related to emergent procedures, Mr. Silber cath could not be completed today.  As he is current chest pain free and hemodynamically stable, cath has been rescheduled for tomorrow (02/13/2022) with Dr. Herbie Baltimore.  Plan has been discussed with Mr. Kruszka, who is in agreement.  Diet order has been placed; he will be NPO after midnight tonight.  Yvonne Kendall, MD [facility]

## 2022-02-12 NOTE — Progress Notes (Signed)
*  PRELIMINARY RESULTS* Echocardiogram 2D Echocardiogram has been performed.  Cristela Blue 02/12/2022, 1:39 PM

## 2022-02-12 NOTE — Consult Note (Signed)
Cardiology Consultation   Patient ID: Logan Crawford MRN: [id number]; DOB: 1976/01/26  Admit date: 02/11/2022 Date of Consult: 02/12/2022  PCP:  Aviva KluverPcp, No   [facility] Providers Cardiologist:  Debbe OdeaBrian Agbor-Etang, MD        Patient Profile:   Logan Crawford is a [age] y.o. male with a hx of coronary artery disease with NSTEMI status post PCI/DES to the LAD in 12/2019, status post PCI to the LAD due to in-stent restenosis, medication nonadherence, hypertension, hyperlipidemia and polysubstance abuse including cocaine and tobacco, who is being seen 02/12/2022 for the evaluation of chest pain and elevated high-sensitivity troponins at the request of Dr. Para Marchuncan.  History of Present Illness:   Logan Crawford was admitted to the hospital 11/2019 with progressive weakness, fatigue, dyspnea, cough and diarrhea.  Was found to be COVID positive.  High-sensitivity troponin peaked at 32.  Echo showed EF 55%, no regional motion abnormalities, G1 DD, normal RV systolic function, with no significant valvular abnormalities.  He was readmitted to the hospital 12/2019 with NSTEMI with high-sensitivity troponin peaking at 3520.  Repeat echocardiogram revealed an EF 50%, moderate hypokinesis of the mid apical anterior septal wall, mild LVH, diastolic dysfunction, and mild aortic valve sclerosis without evidence of stenosis.  Left heart catheterization showed severe one-vessel coronary artery disease 90% stenosis in the mid LAD which was felt to be likely the culprit for the NSTEMI.  There was 60% ostial proximal RCA stenosis, 40% proximal to mid RCA stenosis and 40% distal LAD stenosis.  LVEF was 45% with moderate to severe anterior apical hypokinesis.  He underwent successful PCI/DES to the mid LAD.  Lexiscan MPI 12/13/2020 showed small region of ischemia in the apical wall versus concern for over processing, was overall considered low risk study.  He was readmitted to the hospital again 04/2021 with chest  pain troponin peaked at 175.  Urine drug screen positive for cocaine.  Echocardiogram showed EF of 50-55%, hypokinesis to the mid distal anteroseptal/septal and apical region, G1 DD, mild mitral regurgitation, aortic valve sclerosis without evidence of stenosis.  Left heart cath showed severe one-vessel coronary artery disease and due to distal edge restenosis of the previously placed stent to the LAD.  In addition there was extensive plaque distal to the stent involving the whole mid segment.  There was stable moderate RCA disease.  He underwent successful complex IVUS guided bifurcation angioplasty and DES placement to the mid LAD.  IVUS before PCI showed underexpansion distally as well as extensive plaque in the distal to the stent.  Another stent was added distally due to suspected distal edge dissection.  PTCA was performed to the ostial D2 before placing the stent.  Echocardiogram in 06/2021 demonstrated an EF of 60 to 65%, no regional wall motion abnormalities, moderate LVH, G2 DD, and mild mitral regurgitation.  He was then readmitted to the hospital 11/16/2021 through 11/19/2021 with unstable angina with troponin peaking at 29.  He was not taking his medication.  Urine drug screen was positive for cocaine.  Echo revealed EF 60-65%, no regional wall motion abnormalities, moderate concentric LVH, G1 DD, trivial mitral regurgitation, aortic valve sclerosis without evidence of stenosis.  Left heart cath showed significant two-vessel CAD.  Patient overlapped LAD stents with evidence of significant in-stent restenosis in the proximal segment.  Borderline 60% stenosis distal to the stent.  There was noted moderate RCA disease, though the distal stenosis appeared worse.  Intracoronary nitroglycerin was given which improved the overall appearance  of the coronary arteries.  Normal LV systolic function with moderately elevated LVEDP.  Intervention was deferred at this time given overall difficult situation due to patient  poor compliance and medical therapy as well as continued cocaine use.  He presented to the [facility] emergency department 02/11/2022 with complaints of chest pain.  He states that the pain was heaviness and sharp type of pain was present over a couple of hours with no alleviating or aggravating symptoms. He stated that he was laying on the couch watching TV, so no exertion. He was given aspirin and nitroglycerin by EMS prior to arrival.  Patient states that his initial chest pain was rated 10/10 and reduced to 0/10 after the nitroglycerin however pain returned to 3/10.  He does endorse worsening chest pain with exertion and states that it does feel similar to previous episodes of angina he has had in the past.  Endorses shortness of breath associated with chest pain on exertion and at rest. He denies any associated symptoms of nausea, vomiting, lightheadedness, or palpitations.  Initial vital signs: Blood pressure 149/98, pulse of 97, respirations 15, temperature 97.5.  Labs: Blood glucose of 143, serum creatinine of 1.34, high-sensitivity troponin of 256 and 429, respiratory panel is negative, urine drug screen is still pending, CBC was unremarkable  Medications given in the emergency department: Heparin bolus and infusion, nitro drip was started  Imaging: Chest x-ray reveals mediastinal contours within normal limits, both lungs clear, no active disease   Past Medical History:  Diagnosis Date   CAD (coronary artery disease)    a. 11/2019 NSTEMI/PCI: LM min irregs, LAD 75m (2.75x22 Resolute Onyx DES), 40d, D2 min irregs, LCX/LPAV min irregs, RCA 60ost, 40p/m, EF 45-50%; b. 11/2020 MV: EF 43%, small area of apical ischemia ->? over processing-->low risk.   Hyperlipidemia    Hypertension    Ischemic cardiomyopathy    a. 11/2019 LV Gram: EF 45-50%; b. 12/2019 Echo: EF 50% w/ mod mid-apical anteroseptal HK, GrI DD, nl RV fxn, mild Ao sclerosis.   Tobacco use 11/25/2019    Past Surgical History:   Procedure Laterality Date   CORONARY STENT INTERVENTION N/A 01/24/2020   Procedure: CORONARY STENT INTERVENTION;  Surgeon: Iran Ouch, MD;  Location: [facility] INVASIVE CV LAB;  Service: Cardiovascular;  Laterality: N/A;   CORONARY STENT INTERVENTION N/A 05/20/2021   Procedure: CORONARY STENT INTERVENTION;  Surgeon: Iran Ouch, MD;  Location: [facility] INVASIVE CV LAB;  Service: Cardiovascular;  Laterality: N/A;   INTRAVASCULAR ULTRASOUND/IVUS N/A 05/20/2021   Procedure: Intravascular Ultrasound/IVUS;  Surgeon: Iran Ouch, MD;  Location: [facility] INVASIVE CV LAB;  Service: Cardiovascular;  Laterality: N/A;   IRRIGATION AND DEBRIDEMENT KNEE Left    LEFT HEART CATH AND CORONARY ANGIOGRAPHY N/A 01/24/2020   Procedure: LEFT HEART CATH AND CORONARY ANGIOGRAPHY;  Surgeon: Iran Ouch, MD;  Location: [facility] INVASIVE CV LAB;  Service: Cardiovascular;  Laterality: N/A;   LEFT HEART CATH AND CORONARY ANGIOGRAPHY N/A 11/18/2021   Procedure: LEFT HEART CATH AND CORONARY ANGIOGRAPHY;  Surgeon: Iran Ouch, MD;  Location: [facility] INVASIVE CV LAB;  Service: Cardiovascular;  Laterality: N/A;   LEFT HEART CATH AND CORS/GRAFTS ANGIOGRAPHY N/A 05/20/2021   Procedure: LEFT HEART CATH AND CORS/GRAFTS ANGIOGRAPHY;  Surgeon: Iran Ouch, MD;  Location: [facility] INVASIVE CV LAB;  Service: Cardiovascular;  Laterality: N/A;   TYMPANOSTOMY TUBE PLACEMENT Bilateral      Home Medications:  Prior to Admission medications   Medication Sig Start Date End Date Taking?  Authorizing Provider  aspirin EC 81 MG tablet Take 1 tablet (81 mg total) by mouth daily. Swallow whole. 11/19/21  Yes Sunnie Nielsen, DO  carvedilol (COREG) 6.25 MG tablet Take 1 tablet (6.25 mg total) by mouth 2 (two) times daily with a meal. 11/19/21  Yes Sunnie Nielsen, DO  isosorbide mononitrate (IMDUR) 30 MG 24 hr tablet Take 3 tablets (90 mg total) by mouth daily. 11/19/21 02/11/22 Yes Sunnie Nielsen, DO  ticagrelor (BRILINTA) 90 MG  TABS tablet Take one tablet by mouth every 12 hours 12/06/21  Yes   albuterol (VENTOLIN HFA) 108 (90 Base) MCG/ACT inhaler Inhale 2 puffs into the lungs every 6 (six) hours as needed for wheezing or shortness of breath. 11/19/21 02/17/22  Sunnie Nielsen, DO  amLODipine (NORVASC) 10 MG tablet Take 0.5 tablets (5 mg total) by mouth daily. Patient not taking: Reported on 02/11/2022 11/19/21   Sunnie Nielsen, DO  atorvastatin (LIPITOR) 80 MG tablet Take 1 tablet (80 mg total) by mouth daily. Patient not taking: Reported on 02/11/2022 11/19/21   Sunnie Nielsen, DO  cephALEXin (KEFLEX) 500 MG capsule Take 1 capsule (500 mg total) by mouth 2 (two) times daily. Patient not taking: Reported on 02/11/2022 11/21/21   Sondra Barges, PA-C  losartan (COZAAR) 50 MG tablet Take 0.5 tablets (25 mg total) by mouth daily. Patient not taking: Reported on 02/11/2022 11/19/21   Sunnie Nielsen, DO  Multiple Vitamin (MULTIVITAMIN) tablet Take 1 tablet by mouth daily. Patient not taking: Reported on 02/11/2022 11/19/21   Sunnie Nielsen, DO  nitroGLYCERIN (NITROSTAT) 0.4 MG SL tablet Place 1 tablet (0.4 mg total) under the tongue every 5 (five) minutes as needed for chest pain (if chest pain not resolved after 3 doses, please call 911). 11/19/21   Sunnie Nielsen, DO  pantoprazole (PROTONIX) 40 MG tablet Take 1 tablet (40 mg total) by mouth daily. Patient not taking: Reported on 02/11/2022 11/19/21 02/17/22  Sunnie Nielsen, DO    Inpatient Medications: Scheduled Meds:  aspirin EC  81 mg Oral Daily   atorvastatin  80 mg Oral Daily   carvedilol  6.25 mg Oral BID WC   losartan  25 mg Oral Daily   pantoprazole  40 mg Oral Daily   ticagrelor  90 mg Oral BID   Continuous Infusions:  heparin 1,500 Units/hr (02/12/22 0744)   nitroGLYCERIN 20 mcg/min (02/12/22 0742)   PRN Meds: acetaminophen **OR** acetaminophen, acetaminophen, albuterol, morphine injection, ondansetron (ZOFRAN) IV, ondansetron **OR**  ondansetron (ZOFRAN) IV  Allergies:   No Known Allergies  Social History:   Social History   Socioeconomic History   Marital status: Single    Spouse name: Not on file   Number of children: Not on file   Years of education: Not on file   Highest education level: Not on file  Occupational History   Not on file  Tobacco Use   Smoking status: Former    Packs/day: 1.00    Years: 22.00    Total pack years: 22.00    Types: Cigarettes    Quit date: 01/23/2020    Years since quitting: 2.0   Smokeless tobacco: Never   Tobacco comments:    Smokes 1-2 cigarettes daily    Vaping Use   Vaping Use: Never used  Substance and Sexual Activity   Alcohol use: Yes    Alcohol/week: 1.0 standard drink of alcohol    Types: 1 Shots of liquor per week    Comment: socially   Drug use: No  Sexual activity: Not on file  Other Topics Concern   Not on file  Social History Narrative   Not on file   Social Determinants of Health   Financial Resource Strain: Not on file  Food Insecurity: No Food Insecurity (11/16/2021)   Hunger Vital Sign    Worried About Running Out of Food in the Last Year: Never true    Ran Out of Food in the Last Year: Never true  Transportation Needs: No Transportation Needs (11/16/2021)   PRAPARE - Administrator, Civil Service (Medical): No    Lack of Transportation (Non-Medical): No  Physical Activity: Not on file  Stress: Not on file  Social Connections: Not on file  Intimate Partner Violence: Not At Risk (11/16/2021)   Humiliation, Afraid, Rape, and Kick questionnaire    Fear of Current or Ex-Partner: No    Emotionally Abused: No    Physically Abused: No    Sexually Abused: No    Family History:    Family History  Problem Relation Age of Onset   Coronary artery disease Mother    Arrhythmia Mother        Pacemaker placed in 2021   Heart failure Mother    Coronary artery disease Father      ROS:  Please see the history of present illness.   Review of Systems  Respiratory:  Positive for shortness of breath and wheezing.   Cardiovascular:  Positive for chest pain.    All other ROS reviewed and negative.     Physical Exam/Data:   Vitals:   02/12/22 0600 02/12/22 0700 02/12/22 0800 02/12/22 0857  BP:  (!) 147/103 (!) 167/109   Pulse: 79 83 79   Resp: 17 16 16    Temp:    (!) 97.4 F (36.3 C)  TempSrc:      SpO2: 99%     Weight:      Height:        Intake/Output Summary (Last 24 hours) at 02/12/2022 0904 Last data filed at 02/12/2022 0252 Gross per 24 hour  Intake 179.66 ml  Output --  Net 179.66 ml      02/11/2022    5:49 PM 11/21/2021   10:24 AM 11/18/2021   11:27 AM  Last 3 Weights  Weight (lbs) 210 lb 225 lb 12.8 oz 220 lb  Weight (kg) 95.255 kg 102.422 kg 99.791 kg     Body mass index is 28.48 kg/m.  General:  Well nourished, well developed, in no acute distress HEENT: normal Neck: no JVD Vascular: No carotid bruits; Distal pulses 2+ bilaterally Cardiac:  normal S1, S2; RRR; no murmur  Lungs:  clear to auscultation bilaterally, no wheezing, rhonchi or rales  Abd: soft, nontender, no hepatomegaly  Ext: no edema Musculoskeletal:  No deformities, BUE and BLE strength normal and equal Skin: warm and dry  Neuro:  CNs 2-12 intact, no focal abnormalities noted Psych:  Normal affect   EKG:  The EKG was personally reviewed and demonstrates: Sinus tachycardia with a rate of 109, LVH, ST depression in inferior lateral leads Telemetry:  Telemetry was personally reviewed and demonstrates:  sinus 80's with LVH and ST depression  Relevant CV Studies: LHC 11/18/2021:   Ost LM lesion is 30% stenosed.   Dist LAD lesion is 40% stenosed.   Ost RCA to Prox RCA lesion is 50% stenosed.   Prox RCA to Mid RCA lesion is 40% stenosed.   2nd Diag lesion is 30% stenosed.   RPDA  lesion is 40% stenosed.   Mid LAD-3 lesion is 60% stenosed.   Mid LAD-1 lesion is 80% stenosed.   Mid RCA lesion is 70% stenosed.   The left  ventricular systolic function is normal.   LV end diastolic pressure is mildly elevated.   The left ventricular ejection fraction is 55-65% by visual estimate.   1.  Significant two-vessel coronary artery disease.  Patent overlapped LAD stents but there is evidence of significant in-stent restenosis in the proximal segment.  In addition, there is borderline 60% stenosis distal to the stent.  Known moderate RCA disease but the distal stenosis appears worse. Intracoronary nitroglycerin was given which improved the overall appearance of the coronary arteries. 2.  Normal LV systolic function and moderately elevated left ventricular end-diastolic pressure.   Recommendations: Difficult situation overall given the patient's poor compliance with medical therapy and continued cocaine use.  This is a second in-stent restenosis in the LAD.  Management options include CABG or balloon angioplasty of the proximal portion of the LAD stent.  Will discuss options with the patient. __________   2D echo 11/16/2021: 1. Left ventricular ejection fraction, by estimation, is 60 to 65%. The  left ventricle has normal function. The left ventricle has no regional  wall motion abnormalities. There is moderate concentric left ventricular  hypertrophy. Left ventricular  diastolic parameters are consistent with Grade I diastolic dysfunction  (impaired relaxation).   2. Right ventricular systolic function is normal. The right ventricular  size is normal. Tricuspid regurgitation signal is inadequate for assessing  PA pressure.   3. The mitral valve is grossly normal. Trivial mitral valve  regurgitation. No evidence of mitral stenosis.   4. The aortic valve is tricuspid. There is mild calcification of the  aortic valve. Aortic valve regurgitation is not visualized. Aortic valve  sclerosis is present, with no evidence of aortic valve stenosis.   5. The inferior vena cava is normal in size with greater than 50%  respiratory  variability, suggesting right atrial pressure of 3 mmHg.   Comparison(s): No significant change from prior study. __________   Limited echo 07/16/2021: 1. Left ventricular ejection fraction, by estimation, is 60 to 65%. The  left ventricle has normal function. The left ventricle has no regional  wall motion abnormalities. There is moderate left ventricular hypertrophy.  Left ventricular diastolic  parameters are consistent with Grade II diastolic dysfunction  (pseudonormalization). The average left ventricular global longitudinal  strain is -11.7 %.   2. Right ventricular systolic function is normal. The right ventricular  size is normal. Tricuspid regurgitation signal is inadequate for assessing  PA pressure.   3. The mitral valve is normal in structure. Mild mitral valve  regurgitation. No evidence of mitral stenosis.   4. The aortic valve is normal in structure. Aortic valve regurgitation is  not visualized. No aortic stenosis is present.   5. The inferior vena cava is normal in size with greater than 50%  respiratory variability, suggesting right atrial pressure of 3 mmHg. __________   LHC 05/20/2021:   Dist LAD lesion is 40% stenosed.   Prox RCA to Mid RCA lesion is 40% stenosed.   Ost RCA to Prox RCA lesion is 50% stenosed.   RPDA lesion is 40% stenosed.   Ost LM lesion is 30% stenosed.   Mid LAD lesion is 85% stenosed.   2nd Diag lesion is 60% stenosed.   A drug-eluting stent was successfully placed using a STENT ONYX FRONTIER [id number].   Balloon  angioplasty was performed using a BALLN TREK RX 2.5X12.   Post intervention, there is a 0% residual stenosis.   Post intervention, there is a 30% residual stenosis.   LV end diastolic pressure is moderately elevated.   The left ventricular ejection fraction is 50-55% by visual estimate.   1.  Severe one-vessel coronary artery disease due to distal edge restenosis of the previously placed stent in the left anterior descending  artery.  In addition, there is extensive plaque distal to the stent involving the whole mid segment.  Stable moderate right coronary artery disease. 2.  Low normal LV systolic function with an EF of 50 to 55% with mild anterior apical hypokinesis.  Moderately elevated left ventricular end-diastolic pressure. 3.  Successful complex IVUS guided bifurcation angioplasty and drug-eluting stent placement to the mid LAD.  IVUS before PCI showed stent underexpansion distally as well as extensive plaque distal to the stent.  No stent fractures.  Another stent was added distally due to suspected distal edge dissection.  Balloon angioplasty was performed and the ostial second diagonal before placing the stent.    Recommendations: Dual antiplatelet therapy for at least 1 year. Aggressive treatment of risk factors. I discussed with the patient the importance of compliance with medications. AVOID catheterization via the right radial artery in the future due to significant radial artery spasm.  Only a 5 [place] guide could be advanced. __________   2D echo 05/18/2021:  1. Left ventricular ejection fraction, by estimation, is 50 to 55%. The  left ventricle has low normal function. The left ventricle demonstrates  regional wall motion abnormalities (hypokinesis of the mid to distal  anteroseptal/septal, apical region There   is moderate left ventricular hypertrophy. Left ventricular diastolic  parameters are consistent with Grade I diastolic dysfunction (impaired  relaxation).   2. Right ventricular systolic function is normal. The right ventricular  size is normal. Tricuspid regurgitation signal is inadequate for assessing  PA pressure.   3. The mitral valve is normal in structure. Mild mitral valve  regurgitation. No evidence of mitral stenosis.   4. The aortic valve was not well visualized. Aortic valve regurgitation  is not visualized. Aortic valve sclerosis is present, with no evidence of  aortic valve  stenosis.   5. The inferior vena cava is normal in size with greater than 50%  respiratory variability, suggesting right atrial pressure of 3 mmHg.  Laboratory Data:  High Sensitivity Troponin:   Recent Labs  Lab 02/11/22 1800 02/11/22 2018  TROPONINIHS 256* 429*     Chemistry Recent Labs  Lab 02/11/22 1800  NA 139  K 3.8  CL 107  CO2 24  GLUCOSE 143*  BUN 13  CREATININE 1.34*  CALCIUM 9.2  GFRNONAA >60  ANIONGAP 8    No results for input(s): "PROT", "ALBUMIN", "AST", "ALT", "ALKPHOS", "BILITOT" in the last 168 hours. Lipids No results for input(s): "CHOL", "TRIG", "HDL", "LABVLDL", "LDLCALC", "CHOLHDL" in the last 168 hours.  Hematology Recent Labs  Lab 02/11/22 1800 02/12/22 0540  WBC 9.2 8.0  RBC 5.00 4.79  HGB 15.1 14.5  HCT 44.0 42.5  MCV 88.0 88.7  MCH 30.2 30.3  MCHC 34.3 34.1  RDW 13.8 13.7  PLT 325 295   Thyroid No results for input(s): "TSH", "FREET4" in the last 168 hours.  BNPNo results for input(s): "BNP", "PROBNP" in the last 168 hours.  DDimer No results for input(s): "DDIMER" in the last 168 hours.   Radiology/Studies:  [facility] Chest Port 1 [address]  Result Date: 02/11/2022 CLINICAL DATA:  Chest pain. EXAM: PORTABLE CHEST 1 VIEW COMPARISON:  November 16, 2021. FINDINGS: The heart size and mediastinal contours are within normal limits. Both lungs are clear. The visualized skeletal structures are unremarkable. IMPRESSION: No active disease. Electronically Signed   By: Lupita Raider M.D.   On: 02/11/2022 18:27     Assessment and Plan:   Unstable angina, NSTEMI, CAD s/p PCI/DES -Presented with several hours of chest discomfort -Pain was initially relieved with Nitrostat -Patient continued on nitro drip and heparin drip -He has remained n.p.o. since arrival -He is chest pain-free on nitro drip -He has been scheduled for left heart catheterization this afternoon -Will need cardiac rehab on discharge -States that he has been compliant with taking  aspirin, Brilinta, carvedilol, and isosorbide unfortunately has been out of amlodipine, atorvastatin, and losartan -Continued on atorvastatin 80 mg daily -Continue with aspirin 81 mg daily -Continued on Brilinta 90 mg twice daily,  previously was advised that he will need to continue with DAPT for minimum of 1 year -Known history of CAD with PCI to the LAD in 2021.  Underwent repeat intervention to the mid LAD in March 2023.  Had diffuse 50% disease in the RCA as well as 60% in the second diagonal branch -Further recommendations to follow after left heart catheterization  Essential hypertension -Blood pressure 147/103 -Currently on nitro drip at 20 mics per minute -Carvedilol 6.25 mg twice daily, losartan 25 mg daily have been ordered but not yet given -Vital signs per unit protocol  Hyperlipidemia -LDL 93 in 10/2021 with normal AST and ALT -He was continued on atorvastatin 80 mg daily -Unfortunately states that he had ran out of cholesterol medication  Polysubstance abuse with cocaine and tobacco -Reports no further cocaine use for the last 1 to 2 months -Urine drug screen ordered and pending -Continue to recommend cessation   Medication nonadherence -Patient states he has had several bouts of chest discomfort since his last hospital discharge in 10/2021 -Reports unfortunately he had lost his sublingual Nitrostat -He had also ran out of amlodipine, atorvastatin, and losartan -Reiterated the importance of medication compliance  Shared Decision Making/Informed Consent The risks [stroke (1 in 1000), death (1 in 1000), kidney failure [usually temporary] (1 in 500), bleeding (1 in 200), allergic reaction [possibly serious] (1 in 200)], benefits (diagnostic support and management of coronary artery disease) and alternatives of a cardiac catheterization were discussed in detail with Logan Crawford and he is willing to proceed.  Risk Assessment/Risk Scores:     TIMI Risk Score for Unstable Angina  or Non-ST Elevation MI:   The patient's TIMI risk score is 5, which indicates a 26% risk of all cause mortality, new or recurrent myocardial infarction or need for urgent revascularization in the next 14 days.          For questions or updates, please contact [facility] Please consult [email or web address] for contact info under    Signed, Alylah Blakney, NP  02/12/2022 9:04 AM

## 2022-02-12 NOTE — Progress Notes (Signed)
PROGRESS NOTE    Logan Crawford  FTD:[id number] DOB: 1975-08-17 DOA: 02/11/2022  PCP: Pcp, No    Brief Narrative:  This 46 y.o. male with medical history significant for HTN, polysubstance abuse, tobacco use disorder, CAD s/p PCI in 04/2021, G1 DD, EF 60-65 10/2021, hospitalized from 11/16/21 to 11/19/21 with NSTEMI for which she underwent repeat Left cardiac cath on 9/25 showing significant two-vessel coronary artery disease which was  treated medically and on DAPT, He presents to the ED with chest pain typical to prior NSTEMI. EMS administered aspirin and sublingual nitro which relieved his pain but after 20 minutes the pain recurred.  Troponin trending up 29> 256> 429>835.  EKG shows ST depression V1 to V3 and aVF.  Chest x-ray no acute disease.  Patient is started on IV heparin and admitted for further evaluation.  Patient was evaluated by cardiology,  recommended left heart catheter today.  Assessment & Plan:   Principal Problem:   NSTEMI (non-ST elevated myocardial infarction) (HCC) Active Problems:   Polysubstance abuse (HCC)   Primary hypertension   Hyperlipidemia   Tobacco use   Chronic diastolic CHF (congestive heart failure) (HCC)  NSTEMI: CAD s/p PCI: Patient presented with typical chest pain like prior NSTEMI. LHC on 11/18/2021 showed significant two-vessel coronary artery disease. He was advised medical management only. Troponin trending up 29> 256> 429>835 Continue IV heparin.  Continue nitroglycerin infusion. Continue aspirin, Brilinta, Lipitor, carvedilol. Cardiology is consulted, plan for left heart cath today.  Polysubstance abuse: Urine drug screen positive for cocaine. Counseled on quitting substance abuse.  Essential hypertension Continue losartan and carvedilol.  Hyperlipidemia: Continue Lipitor  Tobacco use: Counseled on quit smoking.  Chronic diastolic CHF: Appears euvolemic. Last echo showed LVEF 60 to 65%. Continue Coreg and losartan.   DVT  prophylaxis: Heparin Code Status: Full code Family Communication: No family at bed side Disposition Plan:   Status is: Inpatient Remains inpatient appropriate because: Admitted for NSTEMI, started on IV heparin , Cardiology is consulted,  patient is scheduled for [facility] today   Consultants:  Cardiology  Procedures: Scheduled left heart cath today Antimicrobials: None  Subjective: Patient was seen and examined at bedside.  Overnight events noted.  Patient denies any further chest pain.  Patient states cardiology is planning to do left heart cath today.  Objective: Vitals:   02/12/22 1409 02/12/22 1430 02/12/22 1445 02/12/22 1500  BP: (!) 133/90 118/79  (!) 111/90  Pulse: 82 76 84 83  Resp: 15 15 13 13   Temp: 97.9 F (36.6 C)     TempSrc: Oral     SpO2: 96%   96%  Weight:      Height:        Intake/Output Summary (Last 24 hours) at 02/12/2022 1525 Last data filed at 02/12/2022 0252 Gross per 24 hour  Intake 179.66 ml  Output --  Net 179.66 ml   Filed Weights   02/11/22 1749  Weight: 95.3 kg    Examination:  General exam: Appears comfortable, not in any acute distress.  Deconditioned. Respiratory system: Clear to auscultation. Respiratory effort normal. Cardiovascular system: S1 & S2 heard, RRR.  Regular rate and rhythm, no murmur. Gastrointestinal system: Abdomen is soft, non tender, non distended, BS+ Central nervous system: Alert and oriented x 3. No focal neurological deficits. Extremities:  No edema, no cyanosis, no clubbing Skin: No rashes, lesions or ulcers Psychiatry: Judgement and insight appear normal. Mood & affect appropriate.     Data Reviewed: I have personally reviewed  following labs and imaging studies  CBC: Recent Labs  Lab 02/11/22 1800 02/12/22 0540  WBC 9.2 8.0  HGB 15.1 14.5  HCT 44.0 42.5  MCV 88.0 88.7  PLT 325 295   Basic Metabolic Panel: Recent Labs  Lab 02/11/22 1800  NA 139  K 3.8  CL 107  CO2 24  GLUCOSE 143*  BUN 13   CREATININE 1.34*  CALCIUM 9.2   GFR: Estimated Creatinine Clearance: 80.8 mL/min (A) (by C-G formula based on SCr of 1.34 mg/dL (H)). Liver Function Tests: No results for input(s): "AST", "ALT", "ALKPHOS", "BILITOT", "PROT", "ALBUMIN" in the last 168 hours. No results for input(s): "LIPASE", "AMYLASE" in the last 168 hours. No results for input(s): "AMMONIA" in the last 168 hours. Coagulation Profile: Recent Labs  Lab 02/11/22 1800  INR 1.1   Cardiac Enzymes: No results for input(s): "CKTOTAL", "CKMB", "CKMBINDEX", "TROPONINI" in the last 168 hours. BNP (last 3 results) No results for input(s): "PROBNP" in the last 8760 hours. HbA1C: No results for input(s): "HGBA1C" in the last 72 hours. CBG: No results for input(s): "GLUCAP" in the last 168 hours. Lipid Profile: No results for input(s): "CHOL", "HDL", "LDLCALC", "TRIG", "CHOLHDL", "LDLDIRECT" in the last 72 hours. Thyroid Function Tests: No results for input(s): "TSH", "T4TOTAL", "FREET4", "T3FREE", "THYROIDAB" in the last 72 hours. Anemia Panel: No results for input(s): "VITAMINB12", "FOLATE", "FERRITIN", "TIBC", "IRON", "RETICCTPCT" in the last 72 hours. Sepsis Labs: No results for input(s): "PROCALCITON", "LATICACIDVEN" in the last 168 hours.  Recent Results (from the past 240 hour(s))  Resp panel by RT-PCR (RSV, Flu A&B, Covid) Anterior Nasal Swab     Status: None   Collection Time: 02/11/22  8:34 PM   Specimen: Anterior Nasal Swab  Result Value Ref Range Status   SARS Coronavirus 2 by RT PCR NEGATIVE NEGATIVE Final    Comment: (NOTE) SARS-CoV-2 target nucleic acids are NOT DETECTED.  The SARS-CoV-2 RNA is generally detectable in upper respiratory specimens during the acute phase of infection. The lowest concentration of SARS-CoV-2 viral copies this assay can detect is 138 copies/mL. A negative result does not preclude SARS-Cov-2 infection and should not be used as the sole basis for treatment or other patient  management decisions. A negative result may occur with  improper specimen collection/handling, submission of specimen other than nasopharyngeal swab, presence of viral mutation(s) within the areas targeted by this assay, and inadequate number of viral copies(<138 copies/mL). A negative result must be combined with clinical observations, patient history, and epidemiological information. The expected result is Negative.  Fact Sheet for Patients:  BloggerCourse.com  Fact Sheet for Healthcare Providers:  SeriousBroker.it  This test is no t yet approved or cleared by the [place] FDA and  has been authorized for detection and/or diagnosis of SARS-CoV-2 by FDA under an Emergency Use Authorization (EUA). This EUA will remain  in effect (meaning this test can be used) for the duration of the COVID-19 declaration under Section 564(b)(1) of the Act, 21 U.S.C.section 360bbb-3(b)(1), unless the authorization is terminated  or revoked sooner.       Influenza A by PCR NEGATIVE NEGATIVE Final   Influenza B by PCR NEGATIVE NEGATIVE Final    Comment: (NOTE) The Xpert Xpress SARS-CoV-2/FLU/RSV plus assay is intended as an aid in the diagnosis of influenza from Nasopharyngeal swab specimens and should not be used as a sole basis for treatment. Nasal washings and aspirates are unacceptable for Xpert Xpress SARS-CoV-2/FLU/RSV testing.  Fact Sheet for Patients: BloggerCourse.com  Fact  Sheet for Healthcare Providers: SeriousBroker.it  This test is not yet approved or cleared by the [place] and has been authorized for detection and/or diagnosis of SARS-CoV-2 by FDA under an Emergency Use Authorization (EUA). This EUA will remain in effect (meaning this test can be used) for the duration of the COVID-19 declaration under Section 564(b)(1) of the Act, 21 U.S.C. section 360bbb-3(b)(1),  unless the authorization is terminated or revoked.     Resp Syncytial Virus by PCR NEGATIVE NEGATIVE Final    Comment: (NOTE) Fact Sheet for Patients: BloggerCourse.com  Fact Sheet for Healthcare Providers: SeriousBroker.it  This test is not yet approved or cleared by the [place] FDA and has been authorized for detection and/or diagnosis of SARS-CoV-2 by FDA under an Emergency Use Authorization (EUA). This EUA will remain in effect (meaning this test can be used) for the duration of the COVID-19 declaration under Section 564(b)(1) of the Act, 21 U.S.C. section 360bbb-3(b)(1), unless the authorization is terminated or revoked.  Performed at [facility], [address] [place], [place] [address]     Radiology Studies: ECHOCARDIOGRAM LIMITED  Result Date: 02/12/2022    ECHOCARDIOGRAM LIMITED REPORT   Patient Name:   GARREN GREENMAN Date of Exam: 02/12/2022 Medical Rec #:  [id number]        Height:       72.0 in Accession #:    [id number]       Weight:       210.0 lb Date of Birth:  17-Jul-1975       BSA:          2.175 m Patient Age:    46 years         BP:           137/92 mmHg Patient Gender: M                HR:           85 bpm. Exam Location:  [facility] Procedure: Limited Echo, Cardiac Doppler and Color Doppler Indications:     NSTEMI I21.4  History:         Patient has prior history of Echocardiogram examinations, most                  recent 11/16/2021. CAD; Risk Factors:Hypertension. Ischemic                  cardiomyopathy, tobacco abuse.  Sonographer:     Cristela Blue Referring Phys:  [id number] Debbe Odea Diagnosing Phys: Debbe Odea MD  Sonographer Comments: Image quality was fair. IMPRESSIONS  1. Left ventricular ejection fraction, by estimation, is 60 to 65%. The left ventricle has normal function. The left ventricle has no regional wall motion abnormalities.  2. Right ventricular systolic function is normal.  The right ventricular size is normal.  3. The mitral valve is normal in structure. Mild mitral valve regurgitation. FINDINGS  Left Ventricle: Left ventricular ejection fraction, by estimation, is 60 to 65%. The left ventricle has normal function. The left ventricle has no regional wall motion abnormalities. Right Ventricle: The right ventricular size is normal. No increase in right ventricular wall thickness. Right ventricular systolic function is normal. Left Atrium: Left atrial size was normal in size. Right Atrium: Right atrial size was normal in size. Pericardium: There is no evidence of pericardial effusion. Mitral Valve: The mitral valve is normal in structure. Mild mitral valve regurgitation. Pulmonic Valve: The pulmonic valve was not well visualized. Pulmonic  valve regurgitation is not visualized. Additional Comments: Spectral Doppler performed. Color Doppler performed.  LEFT VENTRICLE PLAX 2D LVIDd:         4.10 cm LVIDs:         2.50 cm LV PW:         1.30 cm LV IVS:        1.30 cm  Debbe OdeaBrian Agbor-Etang MD Electronically signed by Debbe OdeaBrian Agbor-Etang MD Signature Date/Time: 02/12/2022/1:56:15 PM    Final    DG Chest Port 1 View  Result Date: 02/11/2022 CLINICAL DATA:  Chest pain. EXAM: PORTABLE CHEST 1 VIEW COMPARISON:  November 16, 2021. FINDINGS: The heart size and mediastinal contours are within normal limits. Both lungs are clear. The visualized skeletal structures are unremarkable. IMPRESSION: No active disease. Electronically Signed   By: Lupita RaiderJames  Green Jr M.D.   On: 02/11/2022 18:27    Scheduled Meds:  [START ON 02/13/2022] aspirin  81 mg Oral Pre-Cath   aspirin EC  81 mg Oral Daily   atorvastatin  80 mg Oral Daily   carvedilol  12.5 mg Oral BID WC   isosorbide mononitrate  60 mg Oral Daily   losartan  25 mg Oral Daily   pantoprazole  40 mg Oral Daily   sodium chloride flush  3 mL Intravenous Q12H   ticagrelor  90 mg Oral BID   Continuous Infusions:  sodium chloride     [START ON  02/13/2022] sodium chloride     Followed by   Melene Muller[START ON 02/13/2022] sodium chloride     heparin 1,500 Units/hr (02/12/22 0914)   nitroGLYCERIN 20 mcg/min (02/12/22 0742)     LOS: 1 day    Time spent: 50 mins    Dakoda Laventure, MD [facility]   If 7PM-7AM, please contact night-coverage

## 2022-02-12 NOTE — ED Notes (Signed)
Msg'd MD about pt's repeat troponin level of 839 and let him know that it is trending upward.

## 2022-02-12 NOTE — Consult Note (Signed)
ANTICOAGULATION CONSULT NOTE - Initial Consult  Pharmacy Consult for heparin Indication: chest pain/ACS  No Known Allergies  Patient Measurements: Height: 6' (182.9 cm) Weight: 95.3 kg (210 lb) IBW/kg (Calculated) : 77.6 Heparin Dosing Weight: 95.3  Vital Signs: Temp: 97.5 F (36.4 C) (12/19 2332) Temp Source: Oral (12/19 2332) BP: 148/90 (12/20 0200) Pulse Rate: 85 (12/20 0230)  Labs: Recent Labs    02/11/22 1800 02/11/22 2018 02/12/22 0149  HGB 15.1  --   --   HCT 44.0  --   --   PLT 325  --   --   APTT 28  --   --   LABPROT 13.9  --   --   INR 1.1  --   --   HEPARINUNFRC  --   --  0.19*  CREATININE 1.34*  --   --   TROPONINIHS 256* 429*  --      Estimated Creatinine Clearance: 80.8 mL/min (A) (by C-G formula based on SCr of 1.34 mg/dL (H)).   Medical History: Past Medical History:  Diagnosis Date   CAD (coronary artery disease)    a. 11/2019 NSTEMI/PCI: LM min irregs, LAD 71m (2.75x22 Resolute Onyx DES), 40d, D2 min irregs, LCX/LPAV min irregs, RCA 60ost, 40p/m, EF 45-50%; b. 11/2020 MV: EF 43%, small area of apical ischemia ->? over processing-->low risk.   Hyperlipidemia    Hypertension    Ischemic cardiomyopathy    a. 11/2019 LV Gram: EF 45-50%; b. 12/2019 Echo: EF 50% w/ mod mid-apical anteroseptal HK, GrI DD, nl RV fxn, mild Ao sclerosis.   Tobacco use 11/25/2019    Medications: No chronic anticoag PTA Aspirin 81 mg, Ticagrelor 90 mg  Assessment: 46 yo M with PMH HTN, HLD, CAD, NSTEMI (12/2019), unstable angina presents with chest pain. Troponins elevated at 256 and ECG shows possible STEMI, infarcts of indeterminate age, and sinus tachycardia. No chronic anticoag PTA noted from chart review. Hgb, Hct, PLT stable.  Date Time aPTT/HL Rate/Comment       Baseline Labs: aPTT - 28; INR - 1.1 Hgb - 15.1; Plts - 325  Goal of Therapy:  Heparin level 0.3-0.7 units/ml Monitor platelets by anticoagulation protocol: Yes   Plan:  12/20:  HL @ 0149 =  0.19, SUBtherapeutic  Will order heparin 2800 units IV X 1 bolus and increase drip rate to 1500 units/hr.  Will recheck HL 6 hrs after rate change.  Mandalyn Pasqua D  02/12/2022 2:46 AM

## 2022-02-12 NOTE — ED Notes (Signed)
Pt to in room restroom. Hooked pt back up to monitor and assessed VS.

## 2022-02-12 NOTE — Consult Note (Signed)
ANTICOAGULATION CONSULT NOTE - Initial Consult  Pharmacy Consult for heparin Indication: chest pain/ACS  No Known Allergies  Patient Measurements: Height: 6' (182.9 cm) Weight: 95.3 kg (210 lb) IBW/kg (Calculated) : 77.6 Heparin Dosing Weight: 95.3  Vital Signs: Temp: 97.9 F (36.6 C) (12/20 1409) Temp Source: Oral (12/20 1409) BP: 111/90 (12/20 1500) Pulse Rate: 65 (12/20 1515)  Labs: Recent Labs    02/11/22 1800 02/11/22 2018 02/12/22 0149 02/12/22 0540 02/12/22 0855 02/12/22 1645  HGB 15.1  --   --  14.5  --   --   HCT 44.0  --   --  42.5  --   --   PLT 325  --   --  295  --   --   APTT 28  --   --   --   --   --   LABPROT 13.9  --   --   --   --   --   INR 1.1  --   --   --   --   --   HEPARINUNFRC  --   --  0.19*  --  0.38 0.41  CREATININE 1.34*  --   --   --   --   --   TROPONINIHS 256* 429*  --   --  839*  --      Estimated Creatinine Clearance: 80.8 mL/min (A) (by C-G formula based on SCr of 1.34 mg/dL (H)).   Medical History: Past Medical History:  Diagnosis Date   CAD (coronary artery disease)    a. 11/2019 NSTEMI/PCI: LM min irregs, LAD 76m (2.75x22 Resolute Onyx DES), 40d, D2 min irregs, LCX/LPAV min irregs, RCA 60ost, 40p/m, EF 45-50%; b. 11/2020 MV: EF 43%, small area of apical ischemia ->? over processing-->low risk.   Hyperlipidemia    Hypertension    Ischemic cardiomyopathy    a. 11/2019 LV Gram: EF 45-50%; b. 12/2019 Echo: EF 50% w/ mod mid-apical anteroseptal HK, GrI DD, nl RV fxn, mild Ao sclerosis.   Tobacco use 11/25/2019    Medications: No chronic anticoag PTA Aspirin 81 mg, Ticagrelor 90 mg  Assessment: 46 yo M with PMH HTN, HLD, CAD, NSTEMI (12/2019), unstable angina presents with chest pain. Troponins elevated at 256 and ECG shows possible STEMI, infarcts of indeterminate age, and sinus tachycardia. No chronic anticoag PTA noted from chart review. Hgb, Hct, PLT stable.  Date Time aPTT/HL Rate/Comment 12/20 0149 HL  0.19 Subtherapeutic  12/20 0855 HL 0.38 Therapeutic x 1 @ 1500 units/hr  12/20 1645 HL 0.41 Therapeutic x 2     Baseline Labs: aPTT - 28; INR - 1.1 Hgb - 15.1; Plts - 325  Goal of Therapy:  Heparin level 0.3-0.7 units/ml Monitor platelets by anticoagulation protocol: Yes   Plan:  Heparin remains therapeutic Continue heparin infusion at 1500 units/hr.  Will recheck HL and CBC with AM labs  Sharen Hones, PharmD, BCPS Clinical Pharmacist   48/20/2023 5:13 PM

## 2022-02-13 ENCOUNTER — Encounter (HOSPITAL_COMMUNITY): Payer: Self-pay

## 2022-02-13 ENCOUNTER — Other Ambulatory Visit: Payer: Self-pay

## 2022-02-13 ENCOUNTER — Encounter
Admission: EM | Disposition: A | Discharge status: Other Acute Inpatient Hospital | Payer: Self-pay | Source: Home / Self Care | Attending: Family Medicine

## 2022-02-13 DIAGNOSIS — F1410 Cocaine abuse, uncomplicated: Secondary | ICD-10-CM

## 2022-02-13 DIAGNOSIS — I259 Chronic ischemic heart disease, unspecified: Secondary | ICD-10-CM

## 2022-02-13 DIAGNOSIS — I25110 Atherosclerotic heart disease of native coronary artery with unstable angina pectoris: Secondary | ICD-10-CM

## 2022-02-13 HISTORY — PX: LEFT HEART CATH AND CORONARY ANGIOGRAPHY: CATH118249

## 2022-02-13 LAB — CBC
HCT: 41.9 % (ref 39.0–52.0)
Hemoglobin: 14.2 g/dL (ref 13.0–17.0)
MCH: 30.3 pg (ref 26.0–34.0)
MCHC: 33.9 g/dL (ref 30.0–36.0)
MCV: 89.3 fL (ref 80.0–100.0)
Platelets: 296 10*3/uL (ref 150–400)
RBC: 4.69 MIL/uL (ref 4.22–5.81)
RDW: 13.9 % (ref 11.5–15.5)
WBC: 7.7 10*3/uL (ref 4.0–10.5)
nRBC: 0 % (ref 0.0–0.2)

## 2022-02-13 LAB — BASIC METABOLIC PANEL
Anion gap: 7 (ref 5–15)
BUN: 14 mg/dL (ref 6–20)
CO2: 22 mmol/L (ref 22–32)
Calcium: 8.5 mg/dL — ABNORMAL LOW (ref 8.9–10.3)
Chloride: 107 mmol/L (ref 98–111)
Creatinine, Ser: 1.33 mg/dL — ABNORMAL HIGH (ref 0.61–1.24)
GFR, Estimated: >60 mL/min (ref >60)
Glucose, Bld: 141 mg/dL — ABNORMAL HIGH (ref 70–99)
Potassium: 4 mmol/L (ref 3.5–5.1)
Sodium: 136 mmol/L (ref 135–145)

## 2022-02-13 LAB — HEPARIN LEVEL (UNFRACTIONATED): Heparin Unfractionated: 0.37 IU/mL (ref 0.30–0.70)

## 2022-02-13 LAB — TROPONIN I (HIGH SENSITIVITY): Troponin I (High Sensitivity): 341 ng/L (ref <18)

## 2022-02-13 SURGERY — LEFT HEART CATH AND CORONARY ANGIOGRAPHY
Anesthesia: Moderate Sedation

## 2022-02-13 MED ORDER — HEPARIN (PORCINE) IN NACL 1000-0.9 UT/500ML-% IV SOLN
INTRAVENOUS | Status: AC
  Filled 2022-02-13: qty 1000

## 2022-02-13 MED ORDER — FENTANYL CITRATE (PF) 100 MCG/2ML IJ SOLN
INTRAMUSCULAR | Status: DC | PRN
  Administered 2022-02-13 (×2): 25 ug via INTRAVENOUS

## 2022-02-13 MED ORDER — SODIUM CHLORIDE 0.9% FLUSH
3.0000 mL | Freq: Two times a day (BID) | INTRAVENOUS | Status: DC
  Administered 2022-02-15: 3 mL via INTRAVENOUS

## 2022-02-13 MED ORDER — SODIUM CHLORIDE 0.9% FLUSH: 3.0000 mL | INTRAVENOUS | Status: DC | PRN

## 2022-02-13 MED ORDER — ACETAMINOPHEN 325 MG PO TABS: 650.0000 mg | ORAL_TABLET | Freq: Four times a day (QID) | ORAL | Status: DC | PRN

## 2022-02-13 MED ORDER — ATORVASTATIN CALCIUM 80 MG PO TABS: 80.0000 mg | ORAL_TABLET | Freq: Every day | ORAL | Status: DC

## 2022-02-13 MED ORDER — HEPARIN (PORCINE) IN NACL 1000-0.9 UT/500ML-% IV SOLN
INTRAVENOUS | Status: DC | PRN
  Administered 2022-02-13 (×2): 500 mL

## 2022-02-13 MED ORDER — ONDANSETRON HCL 4 MG PO TABS: 4.0000 mg | ORAL_TABLET | Freq: Three times a day (TID) | ORAL | Status: DC | PRN

## 2022-02-13 MED ORDER — HEPARIN (PORCINE) 25000 UT/250ML-% IV SOLN
1800.0000 [IU]/h | INTRAVENOUS | Status: DC
  Administered 2022-02-13: 1500 [IU]/h via INTRAVENOUS
  Administered 2022-02-14 – 2022-02-17 (×5): 1700 [IU]/h via INTRAVENOUS
  Filled 2022-02-13 (×5): qty 250

## 2022-02-13 MED ORDER — HEPARIN SODIUM (PORCINE) 1000 UNIT/ML IJ SOLN
INTRAMUSCULAR | Status: DC | PRN
  Administered 2022-02-13: 4500 [IU] via INTRAVENOUS

## 2022-02-13 MED ORDER — FENTANYL CITRATE (PF) 100 MCG/2ML IJ SOLN
INTRAMUSCULAR | Status: AC
  Filled 2022-02-13: qty 2

## 2022-02-13 MED ORDER — HYDRALAZINE HCL 20 MG/ML IJ SOLN: 10.0000 mg | INTRAMUSCULAR | Status: AC | PRN

## 2022-02-13 MED ORDER — NITROGLYCERIN IN D5W 200-5 MCG/ML-% IV SOLN: 0.0000 ug/min | INTRAVENOUS | Status: DC

## 2022-02-13 MED ORDER — IOHEXOL 300 MG/ML  SOLN
INTRAMUSCULAR | Status: DC | PRN
  Administered 2022-02-13: 45 mL

## 2022-02-13 MED ORDER — LIDOCAINE HCL (PF) 1 % IJ SOLN: INTRAMUSCULAR | Status: DC | PRN

## 2022-02-13 MED ORDER — NITROGLYCERIN IN D5W 200-5 MCG/ML-% IV SOLN
0.0000 ug/min | INTRAVENOUS | Status: DC
  Filled 2022-02-13: qty 250

## 2022-02-13 MED ORDER — ONDANSETRON HCL 4 MG/2ML IJ SOLN
INTRAMUSCULAR | Status: AC
  Filled 2022-02-13: qty 2

## 2022-02-13 MED ORDER — ACETAMINOPHEN 325 MG PO TABS
ORAL_TABLET | ORAL | Status: AC
  Filled 2022-02-13: qty 2

## 2022-02-13 MED ORDER — ONDANSETRON HCL 4 MG/2ML IJ SOLN
INTRAMUSCULAR | Status: DC | PRN
  Administered 2022-02-13: 4 mg via INTRAVENOUS

## 2022-02-13 MED ORDER — ALBUTEROL SULFATE HFA 108 (90 BASE) MCG/ACT IN AERS: 1.0000 | INHALATION_SPRAY | RESPIRATORY_TRACT | Status: DC | PRN

## 2022-02-13 MED ORDER — ISOSORBIDE MONONITRATE ER 60 MG PO TB24: 60.0000 mg | ORAL_TABLET | Freq: Every day | ORAL | Status: DC

## 2022-02-13 MED ORDER — MIDAZOLAM HCL 2 MG/2ML IJ SOLN
INTRAMUSCULAR | Status: DC | PRN
  Administered 2022-02-13: 1 mg via INTRAVENOUS

## 2022-02-13 MED ORDER — HEPARIN SODIUM (PORCINE) 1000 UNIT/ML IJ SOLN
INTRAMUSCULAR | Status: AC
  Filled 2022-02-13: qty 10

## 2022-02-13 MED ORDER — NITROGLYCERIN 0.4 MG SL SUBL
0.4000 mg | SUBLINGUAL_TABLET | SUBLINGUAL | Status: DC | PRN
  Administered 2022-02-13 – 2022-02-16 (×6): 0.4 mg via SUBLINGUAL
  Filled 2022-02-13 (×4): qty 1

## 2022-02-13 MED ORDER — MIDAZOLAM HCL 2 MG/2ML IJ SOLN
INTRAMUSCULAR | Status: AC
  Filled 2022-02-13: qty 2

## 2022-02-13 MED ORDER — LABETALOL HCL 5 MG/ML IV SOLN: 10.0000 mg | INTRAVENOUS | Status: AC | PRN

## 2022-02-13 MED ORDER — VERAPAMIL HCL 2.5 MG/ML IV SOLN
INTRAVENOUS | Status: AC
  Filled 2022-02-13: qty 2

## 2022-02-13 MED ORDER — ONDANSETRON HCL 4 MG/2ML IJ SOLN: 4.0000 mg | Freq: Four times a day (QID) | INTRAMUSCULAR | Status: DC | PRN

## 2022-02-13 MED ORDER — ASPIRIN 81 MG PO TBEC: 81.0000 mg | DELAYED_RELEASE_TABLET | Freq: Every day | ORAL | Status: DC

## 2022-02-13 MED ORDER — NITROGLYCERIN IN D5W 200-5 MCG/ML-% IV SOLN
INTRAVENOUS | Status: AC
  Filled 2022-02-13: qty 250

## 2022-02-13 MED ORDER — LOSARTAN POTASSIUM 25 MG PO TABS: 25.0000 mg | ORAL_TABLET | Freq: Every day | ORAL | Status: DC

## 2022-02-13 MED ORDER — PANTOPRAZOLE SODIUM 40 MG PO TBEC: 40.0000 mg | DELAYED_RELEASE_TABLET | Freq: Every day | ORAL | Status: DC

## 2022-02-13 MED ORDER — SODIUM CHLORIDE 0.9 % IV SOLN: 250.0000 mL | INTRAVENOUS | Status: DC | PRN

## 2022-02-13 MED ORDER — VERAPAMIL HCL 2.5 MG/ML IV SOLN
INTRAVENOUS | Status: DC | PRN
  Administered 2022-02-13: 2.5 mg via INTRA_ARTERIAL

## 2022-02-13 MED ORDER — SODIUM CHLORIDE 0.9 % IV SOLN: INTRAVENOUS | Status: AC

## 2022-02-13 MED ORDER — CARVEDILOL 12.5 MG PO TABS: 12.5000 mg | ORAL_TABLET | Freq: Two times a day (BID) | ORAL | Status: DC

## 2022-02-13 SURGICAL SUPPLY — 11 items
BAND CMPR LRG ZPHR (HEMOSTASIS) ×1
BAND ZEPHYR COMPRESS 30 LONG (HEMOSTASIS) IMPLANT
CATH 5FR JL3.5 JR4 ANG PIG MP (CATHETERS) IMPLANT
DRAPE BRACHIAL (DRAPES) IMPLANT
GLIDESHEATH SLEND SS 6F .021 (SHEATH) IMPLANT
GUIDEWIRE INQWIRE 1.5J.035X260 (WIRE) IMPLANT
INQWIRE 1.5J .035X260CM (WIRE) ×1
PACK CARDIAC CATH (CUSTOM PROCEDURE TRAY) ×1 IMPLANT
PROTECTION STATION PRESSURIZED (MISCELLANEOUS) ×1
SET ATX SIMPLICITY (MISCELLANEOUS) IMPLANT
STATION PROTECTION PRESSURIZED (MISCELLANEOUS) IMPLANT

## 2022-02-13 NOTE — Progress Notes (Signed)
Rounding Note    Patient Name: Logan Crawford Date of Encounter: 02/13/2022  [facility] Cardiologist: Debbe Odea, MD   Subjective   Reports having some chest discomfort this morning, 3/10, resolved without intervention Lab work indicating cocaine positive Talked with other members of our team, concern for noncompliance with his antiplatelet therapy Brilinta  Inpatient Medications    Scheduled Meds:  aspirin EC  81 mg Oral Daily   atorvastatin  80 mg Oral Daily   carvedilol  12.5 mg Oral BID WC   isosorbide mononitrate  60 mg Oral Daily   losartan  25 mg Oral Daily   pantoprazole  40 mg Oral Daily   sodium chloride flush  3 mL Intravenous Q12H   ticagrelor  90 mg Oral BID   Continuous Infusions:  sodium chloride     sodium chloride     heparin 1,500 Units/hr (02/13/22 0104)   nitroGLYCERIN 20 mcg/min (02/12/22 0742)   PRN Meds: sodium chloride, acetaminophen **OR** acetaminophen, acetaminophen, albuterol, morphine injection, ondansetron (ZOFRAN) IV, ondansetron **OR** ondansetron (ZOFRAN) IV, sodium chloride flush   Vital Signs    Vitals:   02/13/22 0900 02/13/22 1000 02/13/22 1030 02/13/22 1100  BP: (!) 151/110 (!) 142/97 124/71 123/89  Pulse: 78 85 73 63  Resp:  15 15 12   Temp:      TempSrc:      SpO2:      Weight:      Height:        Intake/Output Summary (Last 24 hours) at 02/13/2022 1230 Last data filed at 02/13/2022 0912 Gross per 24 hour  Intake 3 ml  Output --  Net 3 ml      02/11/2022    5:49 PM 11/21/2021   10:24 AM 11/18/2021   11:27 AM  Last 3 Weights  Weight (lbs) 210 lb 225 lb 12.8 oz 220 lb  Weight (kg) 95.255 kg 102.422 kg 99.791 kg      Telemetry    Normal sinus rhythm- Personally Reviewed  ECG     - Personally Reviewed  Physical Exam   GEN: No acute distress.   Neck: No JVD Cardiac: RRR, no murmurs, rubs, or gallops.  Respiratory: Clear to auscultation bilaterally. GI: Soft, nontender,  non-distended  MS: No edema; No deformity. Neuro:  Nonfocal  Psych: Normal affect   Labs    High Sensitivity Troponin:   Recent Labs  Lab 02/11/22 1800 02/11/22 2018 02/12/22 0855  TROPONINIHS 256* 429* 839*     Chemistry Recent Labs  Lab 02/11/22 1800  NA 139  K 3.8  CL 107  CO2 24  GLUCOSE 143*  BUN 13  CREATININE 1.34*  CALCIUM 9.2  GFRNONAA >60  ANIONGAP 8    Lipids No results for input(s): "CHOL", "TRIG", "HDL", "LABVLDL", "LDLCALC", "CHOLHDL" in the last 168 hours.  Hematology Recent Labs  Lab 02/11/22 1800 02/12/22 0540 02/13/22 0515  WBC 9.2 8.0 7.7  RBC 5.00 4.79 4.69  HGB 15.1 14.5 14.2  HCT 44.0 42.5 41.9  MCV 88.0 88.7 89.3  MCH 30.2 30.3 30.3  MCHC 34.3 34.1 33.9  RDW 13.8 13.7 13.9  PLT 325 295 296   Thyroid No results for input(s): "TSH", "FREET4" in the last 168 hours.  BNPNo results for input(s): "BNP", "PROBNP" in the last 168 hours.  DDimer No results for input(s): "DDIMER" in the last 168 hours.   Radiology    ECHOCARDIOGRAM LIMITED  Result Date: 02/12/2022    ECHOCARDIOGRAM LIMITED REPORT  Patient Name:   Logan Crawford Date of Exam: 02/12/2022 Medical Rec #:  [id number]        Height:       72.0 in Accession #:    [id number]       Weight:       210.0 lb Date of Birth:  1975/10/02       BSA:          2.175 m Patient Age:    46 years         BP:           137/92 mmHg Patient Gender: M                HR:           85 bpm. Exam Location:  [facility] Procedure: Limited Echo, Cardiac Doppler and Color Doppler Indications:     NSTEMI I21.4  History:         Patient has prior history of Echocardiogram examinations, most                  recent 11/16/2021. CAD; Risk Factors:Hypertension. Ischemic                  cardiomyopathy, tobacco abuse.  Sonographer:     Cristela Blue Referring Phys:  [id number] Debbe Odea Diagnosing Phys: Debbe Odea MD  Sonographer Comments: Image quality was fair. IMPRESSIONS  1. Left ventricular ejection fraction, by  estimation, is 60 to 65%. The left ventricle has normal function. The left ventricle has no regional wall motion abnormalities.  2. Right ventricular systolic function is normal. The right ventricular size is normal.  3. The mitral valve is normal in structure. Mild mitral valve regurgitation. FINDINGS  Left Ventricle: Left ventricular ejection fraction, by estimation, is 60 to 65%. The left ventricle has normal function. The left ventricle has no regional wall motion abnormalities. Right Ventricle: The right ventricular size is normal. No increase in right ventricular wall thickness. Right ventricular systolic function is normal. Left Atrium: Left atrial size was normal in size. Right Atrium: Right atrial size was normal in size. Pericardium: There is no evidence of pericardial effusion. Mitral Valve: The mitral valve is normal in structure. Mild mitral valve regurgitation. Pulmonic Valve: The pulmonic valve was not well visualized. Pulmonic valve regurgitation is not visualized. Additional Comments: Spectral Doppler performed. Color Doppler performed.  LEFT VENTRICLE PLAX 2D LVIDd:         4.10 cm LVIDs:         2.50 cm LV PW:         1.30 cm LV IVS:        1.30 cm  Debbe Odea MD Electronically signed by Debbe Odea MD Signature Date/Time: 02/12/2022/1:56:15 PM    Final    DG Chest Port 1 View  Result Date: 02/11/2022 CLINICAL DATA:  Chest pain. EXAM: PORTABLE CHEST 1 VIEW COMPARISON:  November 16, 2021. FINDINGS: The heart size and mediastinal contours are within normal limits. Both lungs are clear. The visualized skeletal structures are unremarkable. IMPRESSION: No active disease. Electronically Signed   By: Lupita Raider M.D.   On: 02/11/2022 18:27    Cardiac Studies   Echocardiogram performed yesterday   1. Left ventricular ejection fraction, by estimation, is 60 to 65%. The  left ventricle has normal function. The left ventricle has no regional  wall motion abnormalities.   2.  Right ventricular systolic function is normal. The right ventricular  size is  normal.   3. The mitral valve is normal in structure. Mild mitral valve  regurgitation.    Patient Profile     46 y.o. male with history of CAD/PCI x2( mid LAD 2021, 04/2021), hypertension, current smoker, prior cocaine use presenting with chest pain/nstemi.   Assessment & Plan    Coronary artery disease, prior PCI of mid LAD, non-STEMI Troponin greater than 800 Treated with heparin infusion, nitro drip 3/10 chest pain this morning, unable to get to cardiac catheterization yesterday, scheduled for today at 1230 with Dr. Herbie Baltimore -Of note he is cocaine positive and has been positive last several admissions -No new focal wall motion abnormality on echocardiogram -Will continue heparin and nitro infusions On his medication list also with carvedilol, losartan, Imdur, Lipitor, aspirin, Brilinta  Essential hypertension Restarted on carvedilol, Imdur, losartan, blood pressure improving Also nitro infusion  Smoking, history of cocaine Cocaine positive this admission, also +2 months ago and 9 months ago Cessation recommended  Long discussion concerning in-stent restenosis, medication compliance needed, discussed CABG if needed  Total encounter time more than 50 minutes  Greater than 50% was spent in counseling and coordination of care with the patient    For questions or updates, please contact [facility] Please consult [email or web address] for contact info under        Signed, Julien Nordmann, MD  02/13/2022, 12:30 PM

## 2022-02-13 NOTE — Consult Note (Signed)
ANTICOAGULATION CONSULT NOTE - Initial Consult  Pharmacy Consult for heparin Indication: chest pain/ACS  No Known Allergies  Patient Measurements: Height: 6' (182.9 cm) Weight: 95.3 kg (210 lb) IBW/kg (Calculated) : 77.6 Heparin Dosing Weight: 95.3  Vital Signs: Temp: 97.5 F (36.4 C) (12/21 1315) Temp Source: Oral (12/21 1315) BP: 144/78 (12/21 1730) Pulse Rate: 67 (12/21 1800)  Labs: Recent Labs    02/11/22 1800 02/11/22 2018 02/12/22 0149 02/12/22 0540 02/12/22 0855 02/12/22 1645 02/13/22 0515  HGB 15.1  --   --  14.5  --   --  14.2  HCT 44.0  --   --  42.5  --   --  41.9  PLT 325  --   --  295  --   --  296  APTT 28  --   --   --   --   --   --   LABPROT 13.9  --   --   --   --   --   --   INR 1.1  --   --   --   --   --   --   HEPARINUNFRC  --   --    < >  --  0.38 0.41 0.37  CREATININE 1.34*  --   --   --   --   --   --   TROPONINIHS 256* 429*  --   --  839*  --   --    < > = values in this interval not displayed.     Estimated Creatinine Clearance: 80.8 mL/min (A) (by C-G formula based on SCr of 1.34 mg/dL (H)).   Medical History: Past Medical History:  Diagnosis Date   CAD (coronary artery disease)    a. 11/2019 NSTEMI/PCI: LM min irregs, LAD 55m (2.75x22 Resolute Onyx DES), 40d, D2 min irregs, LCX/LPAV min irregs, RCA 60ost, 40p/m, EF 45-50%; b. 11/2020 MV: EF 43%, small area of apical ischemia ->? over processing-->low risk.   Hyperlipidemia    Hypertension    Ischemic cardiomyopathy    a. 11/2019 LV Gram: EF 45-50%; b. 12/2019 Echo: EF 50% w/ mod mid-apical anteroseptal HK, GrI DD, nl RV fxn, mild Ao sclerosis.   Tobacco use 11/25/2019    Medications: No chronic anticoag PTA Aspirin 81 mg, Ticagrelor 90 mg  Assessment: 46 yo M with PMH HTN, HLD, CAD, NSTEMI (12/2019), unstable angina presents with chest pain. Troponins elevated at 256 and ECG shows possible STEMI, infarcts of indeterminate age, and sinus tachycardia. No chronic anticoag PTA  noted from chart review. Hgb, Hct, PLT stable.  Date Time aPTT/HL Rate/Comment 12/20 0149 HL 0.19 Subtherapeutic  12/20 0855 HL 0.38 Therapeutic x 1 @ 1500 units/hr  12/20 1645 HL 0.41 Therapeutic x 2    12/21   0515   HL 0.37           Therapeutic X 3   Baseline Labs: aPTT - 28; INR - 1.1 Hgb - 15.1; Plts - 325  Goal of Therapy:  Heparin level 0.3-0.7 units/ml Monitor platelets by anticoagulation protocol: Yes   Plan:  -Heparin drip to be resumed 2 hours after arterial band removal -Restart heparin drip at 1500 units/hr 12/21 @ 2000 -Check HL 6 hours after restart -Daily CBC while on Heparin  Barrie Folk, PharmD Clinical Pharmacist   02/13/2022 6:17 PM

## 2022-02-13 NOTE — H&P (View-Only) (Signed)
Rounding Note    Patient Name: Logan Crawford Date of Encounter: 02/13/2022  [facility] Cardiologist: Debbe Odea, MD   Subjective   Reports having some chest discomfort this morning, 3/10, resolved without intervention Lab work indicating cocaine positive Talked with other members of our team, concern for noncompliance with his antiplatelet therapy Brilinta  Inpatient Medications    Scheduled Meds:  aspirin EC  81 mg Oral Daily   atorvastatin  80 mg Oral Daily   carvedilol  12.5 mg Oral BID WC   isosorbide mononitrate  60 mg Oral Daily   losartan  25 mg Oral Daily   pantoprazole  40 mg Oral Daily   sodium chloride flush  3 mL Intravenous Q12H   ticagrelor  90 mg Oral BID   Continuous Infusions:  sodium chloride     sodium chloride     heparin 1,500 Units/hr (02/13/22 0104)   nitroGLYCERIN 20 mcg/min (02/12/22 0742)   PRN Meds: sodium chloride, acetaminophen **OR** acetaminophen, acetaminophen, albuterol, morphine injection, ondansetron (ZOFRAN) IV, ondansetron **OR** ondansetron (ZOFRAN) IV, sodium chloride flush   Vital Signs    Vitals:   02/13/22 0900 02/13/22 1000 02/13/22 1030 02/13/22 1100  BP: (!) 151/110 (!) 142/97 124/71 123/89  Pulse: 78 85 73 63  Resp:  15 15 12   Temp:      TempSrc:      SpO2:      Weight:      Height:        Intake/Output Summary (Last 24 hours) at 02/13/2022 1230 Last data filed at 02/13/2022 0912 Gross per 24 hour  Intake 3 ml  Output --  Net 3 ml      02/11/2022    5:49 PM 11/21/2021   10:24 AM 11/18/2021   11:27 AM  Last 3 Weights  Weight (lbs) 210 lb 225 lb 12.8 oz 220 lb  Weight (kg) 95.255 kg 102.422 kg 99.791 kg      Telemetry    Normal sinus rhythm- Personally Reviewed  ECG     - Personally Reviewed  Physical Exam   GEN: No acute distress.   Neck: No JVD Cardiac: RRR, no murmurs, rubs, or gallops.  Respiratory: Clear to auscultation bilaterally. GI: Soft, nontender,  non-distended  MS: No edema; No deformity. Neuro:  Nonfocal  Psych: Normal affect   Labs    High Sensitivity Troponin:   Recent Labs  Lab 02/11/22 1800 02/11/22 2018 02/12/22 0855  TROPONINIHS 256* 429* 839*     Chemistry Recent Labs  Lab 02/11/22 1800  NA 139  K 3.8  CL 107  CO2 24  GLUCOSE 143*  BUN 13  CREATININE 1.34*  CALCIUM 9.2  GFRNONAA >60  ANIONGAP 8    Lipids No results for input(s): "CHOL", "TRIG", "HDL", "LABVLDL", "LDLCALC", "CHOLHDL" in the last 168 hours.  Hematology Recent Labs  Lab 02/11/22 1800 02/12/22 0540 02/13/22 0515  WBC 9.2 8.0 7.7  RBC 5.00 4.79 4.69  HGB 15.1 14.5 14.2  HCT 44.0 42.5 41.9  MCV 88.0 88.7 89.3  MCH 30.2 30.3 30.3  MCHC 34.3 34.1 33.9  RDW 13.8 13.7 13.9  PLT 325 295 296   Thyroid No results for input(s): "TSH", "FREET4" in the last 168 hours.  BNPNo results for input(s): "BNP", "PROBNP" in the last 168 hours.  DDimer No results for input(s): "DDIMER" in the last 168 hours.   Radiology    ECHOCARDIOGRAM LIMITED  Result Date: 02/12/2022    ECHOCARDIOGRAM LIMITED REPORT  Patient Name:   Logan Crawford Date of Exam: 02/12/2022 Medical Rec #:  [id number]        Height:       72.0 in Accession #:    [id number]       Weight:       210.0 lb Date of Birth:  10/13/1975       BSA:          2.175 m Patient Age:    46 years         BP:           137/92 mmHg Patient Gender: M                HR:           85 bpm. Exam Location:  [facility] Procedure: Limited Echo, Cardiac Doppler and Color Doppler Indications:     NSTEMI I21.4  History:         Patient has prior history of Echocardiogram examinations, most                  recent 11/16/2021. CAD; Risk Factors:Hypertension. Ischemic                  cardiomyopathy, tobacco abuse.  Sonographer:     Jerry Hege Referring Phys:  [id number] BRIAN AGBOR-ETANG Diagnosing Phys: Brian Agbor-Etang MD  Sonographer Comments: Image quality was fair. IMPRESSIONS  1. Left ventricular ejection fraction, by  estimation, is 60 to 65%. The left ventricle has normal function. The left ventricle has no regional wall motion abnormalities.  2. Right ventricular systolic function is normal. The right ventricular size is normal.  3. The mitral valve is normal in structure. Mild mitral valve regurgitation. FINDINGS  Left Ventricle: Left ventricular ejection fraction, by estimation, is 60 to 65%. The left ventricle has normal function. The left ventricle has no regional wall motion abnormalities. Right Ventricle: The right ventricular size is normal. No increase in right ventricular wall thickness. Right ventricular systolic function is normal. Left Atrium: Left atrial size was normal in size. Right Atrium: Right atrial size was normal in size. Pericardium: There is no evidence of pericardial effusion. Mitral Valve: The mitral valve is normal in structure. Mild mitral valve regurgitation. Pulmonic Valve: The pulmonic valve was not well visualized. Pulmonic valve regurgitation is not visualized. Additional Comments: Spectral Doppler performed. Color Doppler performed.  LEFT VENTRICLE PLAX 2D LVIDd:         4.10 cm LVIDs:         2.50 cm LV PW:         1.30 cm LV IVS:        1.30 cm  Brian Agbor-Etang MD Electronically signed by Brian Agbor-Etang MD Signature Date/Time: 02/12/2022/1:56:15 PM    Final    DG Chest Port 1 View  Result Date: 02/11/2022 CLINICAL DATA:  Chest pain. EXAM: PORTABLE CHEST 1 VIEW COMPARISON:  November 16, 2021. FINDINGS: The heart size and mediastinal contours are within normal limits. Both lungs are clear. The visualized skeletal structures are unremarkable. IMPRESSION: No active disease. Electronically Signed   By: James  Green Jr M.D.   On: 02/11/2022 18:27    Cardiac Studies   Echocardiogram performed yesterday   1. Left ventricular ejection fraction, by estimation, is 60 to 65%. The  left ventricle has normal function. The left ventricle has no regional  wall motion abnormalities.   2.  Right ventricular systolic function is normal. The right ventricular  size is   normal.   3. The mitral valve is normal in structure. Mild mitral valve  regurgitation.    Patient Profile     46 y.o. male with history of CAD/PCI x2( mid LAD 2021, 04/2021), hypertension, current smoker, prior cocaine use presenting with chest pain/nstemi.   Assessment & Plan    Coronary artery disease, prior PCI of mid LAD, non-STEMI Troponin greater than 800 Treated with heparin infusion, nitro drip 3/10 chest pain this morning, unable to get to cardiac catheterization yesterday, scheduled for today at 1230 with Dr. 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    

## 2022-02-13 NOTE — Consult Note (Signed)
ANTICOAGULATION CONSULT NOTE - Initial Consult  Pharmacy Consult for heparin Indication: chest pain/ACS  No Known Allergies  Patient Measurements: Height: 6' (182.9 cm) Weight: 95.3 kg (210 lb) IBW/kg (Calculated) : 77.6 Heparin Dosing Weight: 95.3  Vital Signs: Temp: 98.5 F (36.9 C) (12/21 0512) Temp Source: Oral (12/21 0512) BP: 131/59 (12/21 0512) Pulse Rate: 74 (12/21 0512)  Labs: Recent Labs    02/11/22 1800 02/11/22 2018 02/12/22 0149 02/12/22 0540 02/12/22 0855 02/12/22 1645 02/13/22 0515  HGB 15.1  --   --  14.5  --   --  14.2  HCT 44.0  --   --  42.5  --   --  41.9  PLT 325  --   --  295  --   --  296  APTT 28  --   --   --   --   --   --   LABPROT 13.9  --   --   --   --   --   --   INR 1.1  --   --   --   --   --   --   HEPARINUNFRC  --   --    < >  --  0.38 0.41 0.37  CREATININE 1.34*  --   --   --   --   --   --   TROPONINIHS 256* 429*  --   --  839*  --   --    < > = values in this interval not displayed.     Estimated Creatinine Clearance: 80.8 mL/min (A) (by C-G formula based on SCr of 1.34 mg/dL (H)).   Medical History: Past Medical History:  Diagnosis Date   CAD (coronary artery disease)    a. 11/2019 NSTEMI/PCI: LM min irregs, LAD 59m (2.75x22 Resolute Onyx DES), 40d, D2 min irregs, LCX/LPAV min irregs, RCA 60ost, 40p/m, EF 45-50%; b. 11/2020 MV: EF 43%, small area of apical ischemia ->? over processing-->low risk.   Hyperlipidemia    Hypertension    Ischemic cardiomyopathy    a. 11/2019 LV Gram: EF 45-50%; b. 12/2019 Echo: EF 50% w/ mod mid-apical anteroseptal HK, GrI DD, nl RV fxn, mild Ao sclerosis.   Tobacco use 11/25/2019    Medications: No chronic anticoag PTA Aspirin 81 mg, Ticagrelor 90 mg  Assessment: 46 yo M with PMH HTN, HLD, CAD, NSTEMI (12/2019), unstable angina presents with chest pain. Troponins elevated at 256 and ECG shows possible STEMI, infarcts of indeterminate age, and sinus tachycardia. No chronic anticoag PTA  noted from chart review. Hgb, Hct, PLT stable.  Date Time aPTT/HL Rate/Comment 12/20 0149 HL 0.19 Subtherapeutic  12/20 0855 HL 0.38 Therapeutic x 1 @ 1500 units/hr  12/20 1645 HL 0.41 Therapeutic x 2    12/21   0515   HL 0.37           Therapeutic X 3   Baseline Labs: aPTT - 28; INR - 1.1 Hgb - 15.1; Plts - 325  Goal of Therapy:  Heparin level 0.3-0.7 units/ml Monitor platelets by anticoagulation protocol: Yes   Plan:  Heparin remains therapeutic Continue heparin infusion at 1500 units/hr.  Will recheck HL and CBC with AM labs  Marjory Meints D, PharmD Clinical Pharmacist   02/13/2022 5:46 AM

## 2022-02-13 NOTE — Progress Notes (Signed)
Received patient from cath lab and was told in handoff by special recovery RN that patient would not restart nitro gtt. Orders updated in computer that included nitro gtt. Attempted to contact day shift cardiology team to clarify order with no success as it was after 1900.  Paged Dr Mariah Milling to clarify order. New orders in place to give 3 doses of SL nitro for chest pain. If unrelieved by SL nitro, may start nitro infusion to keep chest pain under control.   Patient having no chest pain at this time.

## 2022-02-13 NOTE — H&P (Signed)
Cardiology Admission History and Physical   Patient ID: Logan Crawford MRN: [id number]; DOB: June 10, 1975   Admission date: (Not on file)  PCP:  Pcp, No   [facility] Providers Cardiologist:  Debbe Odea, MD        Chief Complaint:  Unstable Angina  Patient Profile:   Logan Crawford is a 46 y.o. male with a hx of coronary artery disease with NSTEMI status post PCI/DES to the LAD in 12/2019, status post PCI to the LAD due to in-stent restenosis, medication nonadherence, hypertension, hyperlipidemia and polysubstance abuse including cocaine and tobacco, who is being seen 02/13/2022 for the evaluation of severe multiple vessel disease s/p left heart catheterization. Marland Kitchen  History of Present Illness:   Logan Crawford was admitted to the hospital 11/2019 with progressive weakness, fatigue, dyspnea, cough and diarrhea.  Was found to be COVID positive.  High-sensitivity troponin peaked at 32.  Echo showed EF 55%, no regional motion abnormalities, G1 DD, normal RV systolic function, with no significant valvular abnormalities.  He was readmitted to the hospital 12/2019 with NSTEMI with high-sensitivity troponin peaking at 3520.  Repeat echocardiogram revealed an EF 50%, moderate hypokinesis of the mid apical anterior septal wall, mild LVH, diastolic dysfunction, and mild aortic valve sclerosis without evidence of stenosis.  Left heart catheterization showed severe one-vessel coronary artery disease 90% stenosis in the mid LAD which was felt to be likely the culprit for the NSTEMI.  There was 60% ostial proximal RCA stenosis, 40% proximal to mid RCA stenosis and 40% distal LAD stenosis.  LVEF was 45% with moderate to severe anterior apical hypokinesis.  He underwent successful PCI/DES to the mid LAD.  Lexiscan MPI 12/13/2020 showed small region of ischemia in the apical wall versus concern for over processing, was overall considered low risk study.  He was readmitted to the hospital again  04/2021 with chest pain troponin peaked at 175.  Urine drug screen positive for cocaine. Echocardiogram showed EF of 50-55%, hypokinesis to the mid distal anteroseptal/septal and apical region, G1 DD, mild mitral regurgitation, aortic valve sclerosis without evidence of stenosis.  Left heart cath showed severe one-vessel coronary artery disease and due to distal edge restenosis of the previously placed stent to the LAD.  In addition there was extensive plaque distal to the stent involving the whole mid segment.  There was stable moderate RCA disease.  He underwent successful complex IVUS guided bifurcation angioplasty and DES placement to the mid LAD.  IVUS before PCI showed underexpansion distally as well as extensive plaque in the distal to the stent.  Another stent was added distally due to suspected distal edge dissection.  PTCA was performed to the ostial D2 before placing the stent.  Echocardiogram in 06/2021 demonstrated an EF of 60 to 65%, no regional wall motion abnormalities, moderate LVH, G2 DD, and mild mitral regurgitation.  He was then readmitted to the hospital 11/16/2021 through 11/19/2021 with unstable angina with troponin peaking at 29.  He was not taking his medication.  Urine drug screen was positive for cocaine.  Echo revealed EF 60-65%, no regional wall motion abnormalities, moderate concentric LVH, G1 DD, trivial mitral regurgitation, aortic valve sclerosis without evidence of stenosis.  Left heart cath showed significant two-vessel CAD.  Patient overlapped LAD stents with evidence of significant in-stent restenosis in the proximal segment.  Borderline 60% stenosis distal to the stent.  There was noted moderate RCA disease, though the distal stenosis appeared worse.  Intracoronary nitroglycerin was given which  improved the overall appearance of the coronary arteries.  Normal LV systolic function with moderately elevated LVEDP.  Intervention was deferred at this time given overall difficult  situation due to patient poor compliance and medical therapy as well as continued cocaine use.   He presented to the [facility] emergency department 02/11/2022 with complaints of chest pain.  He states that the pain was heaviness and sharp type of pain was present over a couple of hours with no alleviating or aggravating symptoms. He stated that he was laying on the couch watching TV, so no exertion. He was given aspirin and nitroglycerin by EMS prior to arrival.  Patient states that his initial chest pain was rated 10/10 and reduced to 0/10 after the nitroglycerin however pain returned to 3/10.  He does endorse worsening chest pain with exertion and states that it does feel similar to previous episodes of angina he has had in the past.  Endorses shortness of breath associated with chest pain on exertion and at rest. He denies any associated symptoms of nausea, vomiting, lightheadedness, or palpitations.   Initial vital signs: Blood pressure 149/98, pulse of 97, respirations 15, temperature 97.5.   Labs: Blood glucose of 143, serum creatinine of 1.34, high-sensitivity troponin of 256 and 429, respiratory panel is negative, urine drug screen positive for cocaine, CBC was unremarkable   Medications given in the emergency department: Heparin bolus and infusion, nitro drip was started   Imaging: Chest x-ray reveals mediastinal contours within normal limits, both lungs clear, no active disease  He was pain free on Nitro drip and heparin drip this morning. He was taken to the cath lab by Dr Herbie Baltimore this afternoon for left heart catheterization with possible percutaneous coronary intervention. Procedure revealed progression of severe multivessel disease. He was to be restarted on heparin infusion 2 hours post procedure and is awaiting for a bed to be transferred to [facility] for evaluation by CVTS for surgery. Nitroglycerin drip was held at the beginning of the procedure as he was chest pain free but may need to be  restarted later in the evening if he has recurrent chest pain.  Past Medical History:  Diagnosis Date   CAD (coronary artery disease)    a. 11/2019 NSTEMI/PCI: LM min irregs, LAD 12m (2.75x22 Resolute Onyx DES), 40d, D2 min irregs, LCX/LPAV min irregs, RCA 60ost, 40p/m, EF 45-50%; b. 11/2020 MV: EF 43%, small area of apical ischemia ->? over processing-->low risk.   Hyperlipidemia    Hypertension    Ischemic cardiomyopathy    a. 11/2019 LV Gram: EF 45-50%; b. 12/2019 Echo: EF 50% w/ mod mid-apical anteroseptal HK, GrI DD, nl RV fxn, mild Ao sclerosis.   Tobacco use 11/25/2019    Past Surgical History:  Procedure Laterality Date   CORONARY STENT INTERVENTION N/A 01/24/2020   Procedure: CORONARY STENT INTERVENTION;  Surgeon: Iran Ouch, MD;  Location: [facility] INVASIVE CV LAB;  Service: Cardiovascular;  Laterality: N/A;   CORONARY STENT INTERVENTION N/A 05/20/2021   Procedure: CORONARY STENT INTERVENTION;  Surgeon: Iran Ouch, MD;  Location: [facility] INVASIVE CV LAB;  Service: Cardiovascular;  Laterality: N/A;   INTRAVASCULAR ULTRASOUND/IVUS N/A 05/20/2021   Procedure: Intravascular Ultrasound/IVUS;  Surgeon: Iran Ouch, MD;  Location: [facility] INVASIVE CV LAB;  Service: Cardiovascular;  Laterality: N/A;   IRRIGATION AND DEBRIDEMENT KNEE Left    LEFT HEART CATH AND CORONARY ANGIOGRAPHY N/A 01/24/2020   Procedure: LEFT HEART CATH AND CORONARY ANGIOGRAPHY;  Surgeon: Iran Ouch, MD;  Location:  [facility] INVASIVE CV LAB;  Service: Cardiovascular;  Laterality: N/A;   LEFT HEART CATH AND CORONARY ANGIOGRAPHY N/A 11/18/2021   Procedure: LEFT HEART CATH AND CORONARY ANGIOGRAPHY;  Surgeon: Iran OuchArida, Muhammad A, MD;  Location: [facility] INVASIVE CV LAB;  Service: Cardiovascular;  Laterality: N/A;   LEFT HEART CATH AND CORS/GRAFTS ANGIOGRAPHY N/A 05/20/2021   Procedure: LEFT HEART CATH AND CORS/GRAFTS ANGIOGRAPHY;  Surgeon: Iran OuchArida, Muhammad A, MD;  Location: [facility] INVASIVE CV LAB;  Service:  Cardiovascular;  Laterality: N/A;   TYMPANOSTOMY TUBE PLACEMENT Bilateral      Medications Prior to Admission: Prior to Admission medications   Medication Sig Start Date End Date Taking? Authorizing Provider  albuterol (VENTOLIN HFA) 108 (90 Base) MCG/ACT inhaler Inhale 2 puffs into the lungs every 6 (six) hours as needed for wheezing or shortness of breath. 11/19/21 02/17/22  Sunnie NielsenAlexander, Natalie, DO  amLODipine (NORVASC) 10 MG tablet Take 0.5 tablets (5 mg total) by mouth daily. Patient not taking: Reported on 02/11/2022 11/19/21   Sunnie NielsenAlexander, Natalie, DO  aspirin EC 81 MG tablet Take 1 tablet (81 mg total) by mouth daily. Swallow whole. 11/19/21   Sunnie NielsenAlexander, Natalie, DO  atorvastatin (LIPITOR) 80 MG tablet Take 1 tablet (80 mg total) by mouth daily. Patient not taking: Reported on 02/11/2022 11/19/21   Sunnie NielsenAlexander, Natalie, DO  carvedilol (COREG) 6.25 MG tablet Take 1 tablet (6.25 mg total) by mouth 2 (two) times daily with a meal. 11/19/21   Sunnie NielsenAlexander, Natalie, DO  cephALEXin (KEFLEX) 500 MG capsule Take 1 capsule (500 mg total) by mouth 2 (two) times daily. Patient not taking: Reported on 02/11/2022 11/21/21   Sondra Bargesunn, Ryan M, PA-C  isosorbide mononitrate (IMDUR) 30 MG 24 hr tablet Take 3 tablets (90 mg total) by mouth daily. 11/19/21 02/11/22  Sunnie NielsenAlexander, Natalie, DO  losartan (COZAAR) 50 MG tablet Take 0.5 tablets (25 mg total) by mouth daily. Patient not taking: Reported on 02/11/2022 11/19/21   Sunnie NielsenAlexander, Natalie, DO  Multiple Vitamin (MULTIVITAMIN) tablet Take 1 tablet by mouth daily. Patient not taking: Reported on 02/11/2022 11/19/21   Sunnie NielsenAlexander, Natalie, DO  nitroGLYCERIN (NITROSTAT) 0.4 MG SL tablet Place 1 tablet (0.4 mg total) under the tongue every 5 (five) minutes as needed for chest pain (if chest pain not resolved after 3 doses, please call 911). 11/19/21   Sunnie NielsenAlexander, Natalie, DO  pantoprazole (PROTONIX) 40 MG tablet Take 1 tablet (40 mg total) by mouth daily. Patient not taking: Reported on  02/11/2022 11/19/21 02/17/22  Sunnie NielsenAlexander, Natalie, DO  ticagrelor (BRILINTA) 90 MG TABS tablet Take one tablet by mouth every 12 hours 12/06/21        Allergies:   No Known Allergies  Social History:   Social History   Socioeconomic History   Marital status: Single    Spouse name: Not on file   Number of children: Not on file   Years of education: Not on file   Highest education level: Not on file  Occupational History   Not on file  Tobacco Use   Smoking status: Former    Packs/day: 1.00    Years: 22.00    Total pack years: 22.00    Types: Cigarettes    Quit date: 01/23/2020    Years since quitting: 2.0   Smokeless tobacco: Never   Tobacco comments:    Smokes 1-2 cigarettes daily    Vaping Use   Vaping Use: Never used  Substance and Sexual Activity   Alcohol use: Yes    Alcohol/week: 1.0 standard drink of  alcohol    Types: 1 Shots of liquor per week    Comment: socially   Drug use: No   Sexual activity: Not on file  Other Topics Concern   Not on file  Social History Narrative   Not on file   Social Determinants of Health   Financial Resource Strain: Not on file  Food Insecurity: No Food Insecurity (02/12/2022)   Hunger Vital Sign    Worried About Running Out of Food in the Last Year: Never true    Ran Out of Food in the Last Year: Never true  Transportation Needs: No Transportation Needs (02/12/2022)   PRAPARE - Administrator, Civil Service (Medical): No    Lack of Transportation (Non-Medical): No  Physical Activity: Not on file  Stress: Not on file  Social Connections: Not on file  Intimate Partner Violence: Not At Risk (02/12/2022)   Humiliation, Afraid, Rape, and Kick questionnaire    Fear of Current or Ex-Partner: No    Emotionally Abused: No    Physically Abused: No    Sexually Abused: No    Family History:   The patient's family history includes Arrhythmia in his mother; Coronary artery disease in his father and mother; Heart failure in  his mother.    ROS:  Please see the history of present illness.  Review of Systems  Respiratory:  Positive for shortness of breath.   Cardiovascular:  Positive for chest pain and leg swelling.  Gastrointestinal:  Positive for abdominal pain.  Neurological:  Positive for weakness.   All other ROS reviewed and negative.     Physical Exam/Data:  There were no vitals filed for this visit.  Intake/Output Summary (Last 24 hours) at 02/13/2022 1647 Last data filed at 02/13/2022 0912 Gross per 24 hour  Intake 3 ml  Output --  Net 3 ml      02/13/2022    1:15 PM 02/11/2022    5:49 PM 11/21/2021   10:24 AM  Last 3 Weights  Weight (lbs) 210 lb 210 lb 225 lb 12.8 oz  Weight (kg) 95.255 kg 95.255 kg 102.422 kg     There is no height or weight on file to calculate BMI.  General:  Well nourished, well developed, in no acute distress HEENT: normal Neck: no JVD Vascular: No carotid bruits; Distal pulses 2+ bilaterally   Cardiac:  normal S1, S2; RRR; no murmur  Lungs:  clear to auscultation bilaterally, no wheezing, rhonchi or rales  Abd: soft, nontender, no hepatomegaly  Ext: no edema Musculoskeletal:  No deformities, BUE and BLE strength normal and equal Skin: warm and dry  Neuro:  CNs 2-12 intact, no focal abnormalities noted Psych:  Normal affect    EKG:  The ECG that was done 02/12/22 was personally reviewed and demonstrates sinus rate of 68, right atrial enlargement, LVH, ST depression in the lateral leads  Relevant CV Studies:  [facility] 02/13/2022   Proximal to distal LAD stents: Prox LAD to Mid LAD lesion is 70% stenosed. Jailed 2nd Diag lesion is 80% stenosed.   Mid LAD-1 lesion is 80% stenosed. Mid LAD-2 lesion is 50% stenosed.  (All ISR).  Dist LAD lesion is 99% stenosed (at distal stent edge)   Prox RCA-1 lesion is 70% stenosed. Prox RCA-2 lesion is 70% stenosed.   Mid RCA-1 lesion is 70% stenosed.  Mid RCA-2 lesion is 95% stenosed (ulcerated flat lesion)   RV Branch lesion  is 60% stenosed.   RPDA lesion is 60%  stenosed.   LV end diastolic pressure is severely elevated.   There is no aortic valve stenosis.   Progression of Severe Multivessel: - 2 potential culprit lesions: distal-mid RCA ulcerated 95% ruptured plaque (mid #2), distal mid LAD stent edge 99% with diffuse disease elsewhere RCA:-proximal #1 70%, followed by proximal #2 70%, mid #1 1 70% and mid #2 95%, ostial PDA 60% LAD: Long stented area with proximal concentric 70% are, jailed D2 85% Zio, distal stent focal 80% followed by segmental 50% then 99% at distal edge. LCx-small OM1 and OM 2 with bifurcating OM 3, AVG circumflex with small branches      Recommendation: Restart IV heparin 2 hours after sheath removal.  CVTS consultation which would mean transferred to [facility]  Hospitalist and cardiology team was notified.  Transfer process has been initiated pending bed  Diagnostic Dominance: Co-dominant   TTE 02/12/22 1. Left ventricular ejection fraction, by estimation, is 60 to 65%. The  left ventricle has normal function. The left ventricle has no regional  wall motion abnormalities.   2. Right ventricular systolic function is normal. The right ventricular  size is normal.   3. The mitral valve is normal in structure. Mild mitral valve  regurgitation.   Laboratory Data:  High Sensitivity Troponin:   Recent Labs  Lab 02/11/22 1800 02/11/22 2018 02/12/22 0855  TROPONINIHS 256* 429* 839*      Chemistry Recent Labs  Lab 02/11/22 1800  NA 139  K 3.8  CL 107  CO2 24  GLUCOSE 143*  BUN 13  CREATININE 1.34*  CALCIUM 9.2  GFRNONAA >60  ANIONGAP 8    No results for input(s): "PROT", "ALBUMIN", "AST", "ALT", "ALKPHOS", "BILITOT" in the last 168 hours. Lipids No results for input(s): "CHOL", "TRIG", "HDL", "LABVLDL", "LDLCALC", "CHOLHDL" in the last 168 hours. Hematology Recent Labs  Lab 02/12/22 0540 02/13/22 0515  WBC 8.0 7.7  RBC 4.79 4.69  HGB 14.5 14.2  HCT  42.5 41.9  MCV 88.7 89.3  MCH 30.3 30.3  MCHC 34.1 33.9  RDW 13.7 13.9  PLT 295 296   Thyroid No results for input(s): "TSH", "FREET4" in the last 168 hours. BNPNo results for input(s): "BNP", "PROBNP" in the last 168 hours.  DDimer No results for input(s): "DDIMER" in the last 168 hours.   Radiology/Studies:  CARDIAC CATHETERIZATION  Result Date: 02/13/2022   Proximal to distal LAD stents: Prox LAD to Mid LAD lesion is 70% stenosed. Jailed 2nd Diag lesion is 80% stenosed.   Mid LAD-1 lesion is 80% stenosed. Mid LAD-2 lesion is 50% stenosed.  (All ISR).  Dist LAD lesion is 99% stenosed (at distal stent edge)   Prox RCA-1 lesion is 70% stenosed. Prox RCA-2 lesion is 70% stenosed.   Mid RCA-1 lesion is 70% stenosed.  Mid RCA-2 lesion is 95% stenosed (ulcerated flat lesion)   RV Branch lesion is 60% stenosed.   RPDA lesion is 60% stenosed.   LV end diastolic pressure is severely elevated.   There is no aortic valve stenosis. Progression of Severe Multivessel: - 2 potential culprit lesions: distal-mid RCA ulcerated 95% ruptured plaque (mid #2), distal mid LAD stent edge 99% with diffuse disease elsewhere RCA:-proximal #1 70%, followed by proximal #2 70%, mid #1 1 70% and mid #2 95%, ostial PDA 60% LAD: Long stented area with proximal concentric 70% are, jailed D2 85% Zio, distal stent focal 80% followed by segmental 50% then 99% at distal edge. LCx-small OM1 and OM 2 with  bifurcating OM 3, AVG circumflex with small branches Recommendation: Restart IV heparin 2 hours after sheath removal.  CVTS consultation which would mean transferred to [facility].  Hospitalist and cardiology team was notified.  Transfer process has been initiated pending bed He was on IV NTG upon arrival, but this was discontinued and he has been pain-free.  May need to restart. Bryan Lemma, MD    Assessment and Plan:   Unstable angina, NSTEMI, CAD s/p PCI/DES -Presented with several hours of chest discomfort -Pain  was initially relieved with Nitrostat -Patient was placed on on nitro drip and heparin drip on arrival -Heparin to be restarted 2 hours post procedure if no bleeding is noted from the site -He is chest pain-free on nitro drip -Will need cardiac rehab on discharge -States that he has been compliant with taking aspirin, Brilinta, carvedilol, and isosorbide unfortunately has been out of amlodipine, atorvastatin, and losartan -patients pharmacy states [facility] has not been refilled since September 2023 -Continued on atorvastatin 80 mg daily -Continue with aspirin 81 mg daily -Brilinta 90 mg twice daily has been placed on hold, lose dose given was 02/13/22 at 0902 -Known history of CAD with PCI to the LAD in 2021.  Underwent repeat intervention to the mid LAD in March 2023.  Had diffuse 50% disease in the RCA as well as 60% in the second diagonal branch -LHC done today 02/13/22 revealed progression of severe multivessel disease, patient is awaiting transfer to [facility] for evaluation of surgery   Essential hypertension -Blood pressure 149/77 -Nitro drip placed on hold prior to [facility], may need to be restarted if reoccurrence of chest pain -Carvedilol 12.5 mg twice daily, losartan 25 mg daily  -Vital signs per unit protocol   Hyperlipidemia -LDL 93 in 10/2021 with normal AST and ALT -He was continued on atorvastatin 80 mg daily -Unfortunately states that he had ran out of cholesterol medication   Polysubstance abuse with cocaine and tobacco -Reports no further cocaine use for the last 1 to 2 months -Urine drug screen positive for cocaine for the third time -Continue to recommend cessation    Medication nonadherence -Patient states he has had several bouts of chest discomfort since his last hospital discharge in 10/2021 -Reports unfortunately he had lost his sublingual Nitrostat -He had also ran out of amlodipine, atorvastatin, and losartan -Reiterated the importance of medication  compliance   Risk Assessment/Risk Scores:  { Severity of Illness: The appropriate patient status for this patient is INPATIENT. Inpatient status is judged to be reasonable and necessary in order to provide the required intensity of service to ensure the patient's safety. The patient's presenting symptoms, physical exam findings, and initial radiographic and laboratory data in the context of their chronic comorbidities is felt to place them at high risk for further clinical deterioration. Furthermore, it is not anticipated that the patient will be medically stable for discharge from the hospital within 2 midnights of admission.   * I certify that at the point of admission it is my clinical judgment that the patient will require inpatient hospital care spanning beyond 2 midnights from the point of admission due to high intensity of service, high risk for further deterioration and high frequency of surveillance required.*   For questions or updates, please contact [facility] Please consult [email or web address] for contact info under     Signed, Loukisha Gunnerson, NP  02/13/2022 4:47 PM

## 2022-02-13 NOTE — Progress Notes (Signed)
PROGRESS NOTE    Logan Crawford  [id number] DOB: [birth year] DOA: 02/11/2022  PCP: Pcp, No    Brief Narrative:  This 46 y.o. male with medical history significant for HTN, polysubstance abuse, tobacco use disorder, CAD s/p PCI in 04/2021, G1 DD, EF 60-65 10/2021, hospitalized from 11/16/21 to 11/19/21 with NSTEMI for which she underwent repeat Left cardiac cath on 9/25 showing significant two-vessel coronary artery disease which was  treated medically and on DAPT, He presents to the ED with chest pain typical to prior NSTEMI. EMS administered aspirin and sublingual nitro which relieved his pain but after 20 minutes the pain recurred.  Troponin trending up 29> 256> 429>835.  EKG shows ST depression V1 to V3 and aVF.  Chest x-ray no acute disease.  Patient is started on IV heparin and admitted for further evaluation.  Patient was evaluated by cardiology,  recommended left heart catheter today 12/21.  Assessment & Plan:   Principal Problem:   NSTEMI (non-ST elevated myocardial infarction) ([place]) Active Problems:   Polysubstance abuse ([place])   Hypertension   Hyperlipidemia   Tobacco use   Chronic diastolic CHF (congestive heart failure) ([place])   Cocaine abuse ([place])  NSTEMI: CAD s/p PCI: Patient presented with typical chest pain like prior NSTEMI. LHC on 11/18/2021 showed significant two-vessel coronary artery disease. He was advised medical management only. Troponin trending up 29> 256> 429>835 Continue IV heparin.  Continue nitroglycerin infusion. Continue aspirin, Brilinta, Lipitor, carvedilol. Cardiology is consulted, he was scheduled for left heart cath yesterday but could not happen due to no chest pain. Patient is scheduled for left heart cath today 12/21.  Polysubstance abuse: Urine drug screen positive for cocaine. Counseled on quitting substance abuse.  Essential hypertension Continue losartan ,imdur and carvedilol.  Hyperlipidemia: Continue Lipitor  Tobacco  use: Counseled on quit smoking.  Chronic diastolic CHF: Appears euvolemic. Last echo showed LVEF 60 to 65%. Continue Coreg and losartan.   DVT prophylaxis: Heparin Code Status: Full code Family Communication: No family at bed side Disposition Plan:   Status is: Inpatient Remains inpatient appropriate because: Admitted for NSTEMI, started on IV heparin , Cardiology is consulted,  patient is scheduled for LHC today 12/21   Consultants:  Cardiology  Procedures: Scheduled left heart cath today Antimicrobials: None  Subjective: Patient was seen and examined at bedside.  Overnight events noted. Patient denies any further chest pain. He reports had chest pain in the morning which spontaneously resolved.  Patient is going to have left heart cath today.  Objective: Vitals:   02/13/22 1130 02/13/22 1200 02/13/22 1230 02/13/22 1315  BP: 130/83 (!) 142/99 (!) 110/93 124/84  Pulse: 74 74 73 70  Resp: 14 (!) 9 15 16   Temp:    (!) 97.5 F (36.4 C)  TempSrc:    Oral  SpO2:    95%  Weight:    95.3 kg  Height:    6' (1.829 m)    Intake/Output Summary (Last 24 hours) at 02/13/2022 1335 Last data filed at 02/13/2022 0912 Gross per 24 hour  Intake 3 ml  Output --  Net 3 ml   Filed Weights   02/11/22 1749 02/13/22 1315  Weight: 95.3 kg 95.3 kg    Examination:  General exam: Appears comfortable, not in any acute distress.  Deconditioned. Respiratory system: Clear to auscultation. Respiratory effort normal. Cardiovascular system: S1 & S2 heard, RRR.  Regular rate and rhythm, no murmur. Gastrointestinal system: Abdomen is soft, non tender, non distended, BS+ Central nervous  system: Alert and oriented x 3. No focal neurological deficits. Extremities:  No edema, no cyanosis, no clubbing Skin: No rashes, lesions or ulcers Psychiatry: Judgement and insight appear normal. Mood & affect appropriate.     Data Reviewed: I have personally reviewed following labs and imaging  studies  CBC: Recent Labs  Lab 02/11/22 1800 02/12/22 0540 02/13/22 0515  WBC 9.2 8.0 7.7  HGB 15.1 14.5 14.2  HCT 44.0 42.5 41.9  MCV 88.0 88.7 89.3  PLT 325 295 0000000   Basic Metabolic Panel: Recent Labs  Lab 02/11/22 1800  NA 139  K 3.8  CL 107  CO2 24  GLUCOSE 143*  BUN 13  CREATININE 1.34*  CALCIUM 9.2   GFR: Estimated Creatinine Clearance: 80.8 mL/min (A) (by C-G formula based on SCr of 1.34 mg/dL (H)). Liver Function Tests: No results for input(s): "AST", "ALT", "ALKPHOS", "BILITOT", "PROT", "ALBUMIN" in the last 168 hours. No results for input(s): "LIPASE", "AMYLASE" in the last 168 hours. No results for input(s): "AMMONIA" in the last 168 hours. Coagulation Profile: Recent Labs  Lab 02/11/22 1800  INR 1.1   Cardiac Enzymes: No results for input(s): "CKTOTAL", "CKMB", "CKMBINDEX", "TROPONINI" in the last 168 hours. BNP (last 3 results) No results for input(s): "PROBNP" in the last 8760 hours. HbA1C: No results for input(s): "HGBA1C" in the last 72 hours. CBG: No results for input(s): "GLUCAP" in the last 168 hours. Lipid Profile: No results for input(s): "CHOL", "HDL", "LDLCALC", "TRIG", "CHOLHDL", "LDLDIRECT" in the last 72 hours. Thyroid Function Tests: No results for input(s): "TSH", "T4TOTAL", "FREET4", "T3FREE", "THYROIDAB" in the last 72 hours. Anemia Panel: No results for input(s): "VITAMINB12", "FOLATE", "FERRITIN", "TIBC", "IRON", "RETICCTPCT" in the last 72 hours. Sepsis Labs: No results for input(s): "PROCALCITON", "LATICACIDVEN" in the last 168 hours.  Recent Results (from the past 240 hour(s))  Resp panel by RT-PCR (RSV, Flu A&B, Covid) Anterior Nasal Swab     Status: None   Collection Time: 02/11/22  8:34 PM   Specimen: Anterior Nasal Swab  Result Value Ref Range Status   SARS Coronavirus 2 by RT PCR NEGATIVE NEGATIVE Final    Comment: (NOTE) SARS-CoV-2 target nucleic acids are NOT DETECTED.  The SARS-CoV-2 RNA is generally  detectable in upper respiratory specimens during the acute phase of infection. The lowest concentration of SARS-CoV-2 viral copies this assay can detect is 138 copies/mL. A negative result does not preclude SARS-Cov-2 infection and should not be used as the sole basis for treatment or other patient management decisions. A negative result may occur with  improper specimen collection/handling, submission of specimen other than nasopharyngeal swab, presence of viral mutation(s) within the areas targeted by this assay, and inadequate number of viral copies(<138 copies/mL). A negative result must be combined with clinical observations, patient history, and epidemiological information. The expected result is Negative.  Fact Sheet for Patients:  EntrepreneurPulse.com.au  Fact Sheet for Healthcare Providers:  IncredibleEmployment.be  This test is no t yet approved or cleared by the [place] FDA and  has been authorized for detection and/or diagnosis of SARS-CoV-2 by FDA under an Emergency Use Authorization (EUA). This EUA will remain  in effect (meaning this test can be used) for the duration of the COVID-19 declaration under Section 564(b)(1) of the Act, 21 U.S.C.section 360bbb-3(b)(1), unless the authorization is terminated  or revoked sooner.       Influenza A by PCR NEGATIVE NEGATIVE Final   Influenza B by PCR NEGATIVE NEGATIVE Final    Comment: (  NOTE) The Xpert Xpress SARS-CoV-2/FLU/RSV plus assay is intended as an aid in the diagnosis of influenza from Nasopharyngeal swab specimens and should not be used as a sole basis for treatment. Nasal washings and aspirates are unacceptable for Xpert Xpress SARS-CoV-2/FLU/RSV testing.  Fact Sheet for Patients: BloggerCourse.com  Fact Sheet for Healthcare Providers: SeriousBroker.it  This test is not yet approved or cleared by the [place] FDA  and has been authorized for detection and/or diagnosis of SARS-CoV-2 by FDA under an Emergency Use Authorization (EUA). This EUA will remain in effect (meaning this test can be used) for the duration of the COVID-19 declaration under Section 564(b)(1) of the Act, 21 U.S.C. section 360bbb-3(b)(1), unless the authorization is terminated or revoked.     Resp Syncytial Virus by PCR NEGATIVE NEGATIVE Final    Comment: (NOTE) Fact Sheet for Patients: BloggerCourse.com  Fact Sheet for Healthcare Providers: SeriousBroker.it  This test is not yet approved or cleared by the [place] FDA and has been authorized for detection and/or diagnosis of SARS-CoV-2 by FDA under an Emergency Use Authorization (EUA). This EUA will remain in effect (meaning this test can be used) for the duration of the COVID-19 declaration under Section 564(b)(1) of the Act, 21 U.S.C. section 360bbb-3(b)(1), unless the authorization is terminated or revoked.  Performed at [facility], [address], [place], [place] [address]     Radiology Studies: ECHOCARDIOGRAM LIMITED  Result Date: 02/12/2022    ECHOCARDIOGRAM LIMITED REPORT   Patient Name:   TOMOYA RINGWALD Date of Exam: 02/12/2022 Medical Rec #:  [id number]        Height:       72.0 in Accession #:    [id number]       Weight:       210.0 lb Date of Birth:  03/16/1975       BSA:          2.175 m Patient Age:    46 years         BP:           137/92 mmHg Patient Gender: M                HR:           85 bpm. Exam Location:  [facility] Procedure: Limited Echo, Cardiac Doppler and Color Doppler Indications:     NSTEMI I21.4  History:         Patient has prior history of Echocardiogram examinations, most                  recent 11/16/2021. CAD; Risk Factors:Hypertension. Ischemic                  cardiomyopathy, tobacco abuse.  Sonographer:     Cristela Blue Referring Phys:  [id number] Debbe Odea Diagnosing Phys:  Debbe Odea MD  Sonographer Comments: Image quality was fair. IMPRESSIONS  1. Left ventricular ejection fraction, by estimation, is 60 to 65%. The left ventricle has normal function. The left ventricle has no regional wall motion abnormalities.  2. Right ventricular systolic function is normal. The right ventricular size is normal.  3. The mitral valve is normal in structure. Mild mitral valve regurgitation. FINDINGS  Left Ventricle: Left ventricular ejection fraction, by estimation, is 60 to 65%. The left ventricle has normal function. The left ventricle has no regional wall motion abnormalities. Right Ventricle: The right ventricular size is normal. No increase in right ventricular wall thickness. Right ventricular systolic function  is normal. Left Atrium: Left atrial size was normal in size. Right Atrium: Right atrial size was normal in size. Pericardium: There is no evidence of pericardial effusion. Mitral Valve: The mitral valve is normal in structure. Mild mitral valve regurgitation. Pulmonic Valve: The pulmonic valve was not well visualized. Pulmonic valve regurgitation is not visualized. Additional Comments: Spectral Doppler performed. Color Doppler performed.  LEFT VENTRICLE PLAX 2D LVIDd:         4.10 cm LVIDs:         2.50 cm LV PW:         1.30 cm LV IVS:        1.30 cm  Kate Sable MD Electronically signed by Kate Sable MD Signature Date/Time: 02/12/2022/1:56:15 PM    Final    DG Chest Port 1 View  Result Date: 02/11/2022 CLINICAL DATA:  Chest pain. EXAM: PORTABLE CHEST 1 VIEW COMPARISON:  November 16, 2021. FINDINGS: The heart size and mediastinal contours are within normal limits. Both lungs are clear. The visualized skeletal structures are unremarkable. IMPRESSION: No active disease. Electronically Signed   By: Marijo Conception M.D.   On: 02/11/2022 18:27    Scheduled Meds:  aspirin EC  81 mg Oral Daily   atorvastatin  80 mg Oral Daily   carvedilol  12.5 mg Oral BID WC    isosorbide mononitrate  60 mg Oral Daily   losartan  25 mg Oral Daily   pantoprazole  40 mg Oral Daily   sodium chloride flush  3 mL Intravenous Q12H   ticagrelor  90 mg Oral BID   Continuous Infusions:  nitroGLYCERIN     sodium chloride     sodium chloride 1 mL/kg/hr (02/13/22 1323)   heparin Stopped (02/13/22 1323)   nitroGLYCERIN 20 mcg/min (02/12/22 0742)     LOS: 2 days    Time spent: 35 mins    Pharoah Goggins, MD [facility]   If 7PM-7AM, please contact night-coverage

## 2022-02-13 NOTE — ED Notes (Signed)
Called cathlab, was told to give all morning medications

## 2022-02-13 NOTE — ED Notes (Signed)
Gave report to specials, RN states she will come get him shortly.

## 2022-02-13 NOTE — Interval H&P Note (Signed)
History and Physical Interval Note:  02/13/2022 2:52 PM  Logan Crawford  has presented today for surgery, with the diagnosis of non ST elevation myocardial infarction.  The various methods of treatment have been discussed with the patient and family. After consideration of risks, benefits and other options for treatment, the patient has consented to  Procedure(s): LEFT HEART CATH AND CORONARY ANGIOGRAPHY (N/A) PERCUTANEOUS CORONARY INTERVENTION    as a surgical intervention.  The patient's history has been reviewed, patient examined, no change in status, stable for surgery.  I have reviewed the patient's chart and labs.  Questions were answered to the patient's satisfaction.    Cath Lab Visit (complete for each Cath Lab visit)  Clinical Evaluation Leading to the Procedure:   ACS: Yes.    Non-ACS:    Anginal Classification: CCS IV  Anti-ischemic medical therapy: Maximal Therapy (2 or more classes of medications)  Non-Invasive Test Results: No non-invasive testing performed  Prior CABG: No previous CABG    Bryan Lemma

## 2022-02-14 ENCOUNTER — Other Ambulatory Visit: Payer: Self-pay

## 2022-02-14 DIAGNOSIS — I214 Non-ST elevation (NSTEMI) myocardial infarction: Principal | ICD-10-CM

## 2022-02-14 LAB — BASIC METABOLIC PANEL
Anion gap: 6 (ref 5–15)
BUN: 15 mg/dL (ref 6–20)
CO2: 23 mmol/L (ref 22–32)
Calcium: 8.3 mg/dL — ABNORMAL LOW (ref 8.9–10.3)
Chloride: 109 mmol/L (ref 98–111)
Creatinine, Ser: 1.59 mg/dL — ABNORMAL HIGH (ref 0.61–1.24)
GFR, Estimated: 53 mL/min — ABNORMAL LOW (ref >60)
Glucose, Bld: 100 mg/dL — ABNORMAL HIGH (ref 70–99)
Potassium: 4.1 mmol/L (ref 3.5–5.1)
Sodium: 138 mmol/L (ref 135–145)

## 2022-02-14 LAB — CBC
HCT: 40.8 % (ref 39.0–52.0)
Hemoglobin: 14.1 g/dL (ref 13.0–17.0)
MCH: 30.4 pg (ref 26.0–34.0)
MCHC: 34.6 g/dL (ref 30.0–36.0)
MCV: 87.9 fL (ref 80.0–100.0)
Platelets: 286 10*3/uL (ref 150–400)
RBC: 4.64 MIL/uL (ref 4.22–5.81)
RDW: 14 % (ref 11.5–15.5)
WBC: 6.9 10*3/uL (ref 4.0–10.5)
nRBC: 0 % (ref 0.0–0.2)

## 2022-02-14 LAB — HEPARIN LEVEL (UNFRACTIONATED)
Heparin Unfractionated: 0.25 IU/mL — ABNORMAL LOW (ref 0.30–0.70)
Heparin Unfractionated: 0.35 IU/mL (ref 0.30–0.70)
Heparin Unfractionated: 0.37 IU/mL (ref 0.30–0.70)

## 2022-02-14 LAB — MAGNESIUM: Magnesium: 2.1 mg/dL (ref 1.7–2.4)

## 2022-02-14 LAB — PHOSPHORUS: Phosphorus: 4.5 mg/dL (ref 2.5–4.6)

## 2022-02-14 MED ORDER — ISOSORBIDE MONONITRATE ER 60 MG PO TB24: 60.0000 mg | ORAL_TABLET | Freq: Every day | ORAL | 1 refills | Status: DC

## 2022-02-14 MED ORDER — HEPARIN BOLUS VIA INFUSION
1400.0000 [IU] | Freq: Once | INTRAVENOUS | Status: AC
  Administered 2022-02-14: 1400 [IU] via INTRAVENOUS
  Filled 2022-02-14: qty 1400

## 2022-02-14 MED ORDER — ACETAMINOPHEN 650 MG RE SUPP
650.0000 mg | Freq: Four times a day (QID) | RECTAL | Status: DC | PRN
  Filled 2022-02-14: qty 1

## 2022-02-14 MED ORDER — ACETAMINOPHEN 325 MG PO TABS
650.0000 mg | ORAL_TABLET | Freq: Four times a day (QID) | ORAL | Status: DC | PRN
  Administered 2022-02-15: 650 mg via ORAL
  Filled 2022-02-14: qty 2

## 2022-02-14 MED ORDER — CARVEDILOL 12.5 MG PO TABS: 12.5000 mg | ORAL_TABLET | Freq: Two times a day (BID) | ORAL | 1 refills | Status: DC

## 2022-02-14 MED ORDER — NITROGLYCERIN 2 % TD OINT
0.5000 [in_us] | TOPICAL_OINTMENT | Freq: Four times a day (QID) | TRANSDERMAL | Status: DC
  Filled 2022-02-14: qty 1

## 2022-02-14 MED ORDER — HYDRALAZINE HCL 25 MG PO TABS: 25.0000 mg | ORAL_TABLET | Freq: Three times a day (TID) | ORAL | Status: DC | PRN

## 2022-02-14 NOTE — Discharge Instructions (Signed)
Patient is being transferred to [facility] for CABG versus complex PCI.

## 2022-02-14 NOTE — Progress Notes (Signed)
ANTICOAGULATION CONSULT NOTE   Pharmacy Consult for heparin Indication: chest pain/ACS  No Known Allergies  Patient Measurements: Height: 6' (182.9 cm) Weight: 95.3 kg (210 lb) IBW/kg (Calculated) : 77.6 Heparin Dosing Weight: 95.3  Vital Signs: Temp: 97.7 F (36.5 C) (12/22 0824) BP: 111/76 (12/22 1419) Pulse Rate: 76 (12/22 1216)  Labs: Recent Labs    02/11/22 1800 02/11/22 2018 02/12/22 0149 02/12/22 0540 02/12/22 0855 02/12/22 1645 02/13/22 0515 02/13/22 1915 02/13/22 1916 02/14/22 0228 02/14/22 0956 02/14/22 1550  HGB 15.1  --   --  14.5  --   --  14.2  --   --  14.1  --   --   HCT 44.0  --   --  42.5  --   --  41.9  --   --  40.8  --   --   PLT 325  --   --  295  --   --  296  --   --  286  --   --   APTT 28  --   --   --   --   --   --   --   --   --   --   --   LABPROT 13.9  --   --   --   --   --   --   --   --   --   --   --   INR 1.1  --   --   --   --   --   --   --   --   --   --   --   HEPARINUNFRC  --   --    < >  --  0.38   < > 0.37  --   --  0.25* 0.35 0.37  CREATININE 1.34*  --   --   --   --   --   --   --  1.33* 1.59*  --   --   TROPONINIHS 256* 429*  --   --  839*  --   --  341*  --   --   --   --    < > = values in this interval not displayed.     Estimated Creatinine Clearance: 68.1 mL/min (A) (by C-G formula based on SCr of 1.59 mg/dL (H)).   Medical History: Past Medical History:  Diagnosis Date   CAD (coronary artery disease)    a. 11/2019 NSTEMI/PCI: LM min irregs, LAD 98m (2.75x22 Resolute Onyx DES), 40d, D2 min irregs, LCX/LPAV min irregs, RCA 60ost, 40p/m, EF 45-50%; b. 11/2020 MV: EF 43%, small area of apical ischemia ->? over processing-->low risk.   Hyperlipidemia    Hypertension    Ischemic cardiomyopathy    a. 11/2019 LV Gram: EF 45-50%; b. 12/2019 Echo: EF 50% w/ mod mid-apical anteroseptal HK, GrI DD, nl RV fxn, mild Ao sclerosis.   Tobacco use 11/25/2019    Medications: No chronic anticoag PTA Aspirin 81 mg,  Ticagrelor 90 mg  Assessment: 46 yo M with PMH HTN, HLD, CAD, NSTEMI (12/2019), unstable angina presents with chest pain. Troponins elevated at 256 and ECG shows possible STEMI, infarcts of indeterminate age, and sinus tachycardia. No chronic anticoag PTA noted from chart review. Hgb, Hct, PLT stable. S/p cath with progression of severe multivessel disease.    Date Time aPTT/HL Rate/Comment 12/20 0149 HL 0.19 Subtherapeutic  12/20 0855 HL 0.38 Therapeutic x 1 @ 1500 units/hr  12/20 1645 HL 0.41 Therapeutic x 2    12/21   0515   HL 0.37           Therapeutic X 3  12/22   0228    HL 0.25          SUBtherapeutic  12/22 0956 HL 0.35  Therapeutic 12/22 1550 HL 0.37 Therapeutic  Goal of Therapy:  Heparin level 0.3-0.7 units/ml Monitor platelets by anticoagulation protocol: Yes   Plan:  Heparin level therapeutic x 2, will order next heparin level with AM labs Will continue to follow CBC daily while on heparin therapy  Shaylene Paganelli Rodriguez-Guzman PharmD, BCPS 02/14/2022 4:35 PM

## 2022-02-14 NOTE — Consult Note (Signed)
ANTICOAGULATION CONSULT NOTE   Pharmacy Consult for heparin Indication: chest pain/ACS  No Known Allergies  Patient Measurements: Height: 6' (182.9 cm) Weight: 95.3 kg (210 lb) IBW/kg (Calculated) : 77.6 Heparin Dosing Weight: 95.3  Vital Signs: Temp: 97.7 F (36.5 C) (12/22 0824) Temp Source: Oral (12/21 2324) BP: 162/114 (12/22 0824) Pulse Rate: 82 (12/22 0824)  Labs: Recent Labs    02/11/22 1800 02/11/22 2018 02/12/22 0149 02/12/22 0540 02/12/22 0855 02/12/22 1645 02/13/22 0515 02/13/22 1915 02/13/22 1916 02/14/22 0228 02/14/22 0956  HGB 15.1  --   --  14.5  --   --  14.2  --   --  14.1  --   HCT 44.0  --   --  42.5  --   --  41.9  --   --  40.8  --   PLT 325  --   --  295  --   --  296  --   --  286  --   APTT 28  --   --   --   --   --   --   --   --   --   --   LABPROT 13.9  --   --   --   --   --   --   --   --   --   --   INR 1.1  --   --   --   --   --   --   --   --   --   --   HEPARINUNFRC  --   --    < >  --  0.38   < > 0.37  --   --  0.25* 0.35  CREATININE 1.34*  --   --   --   --   --   --   --  1.33* 1.59*  --   TROPONINIHS 256* 429*  --   --  839*  --   --  341*  --   --   --    < > = values in this interval not displayed.     Estimated Creatinine Clearance: 68.1 mL/min (A) (by C-G formula based on SCr of 1.59 mg/dL (H)).   Medical History: Past Medical History:  Diagnosis Date   CAD (coronary artery disease)    a. 11/2019 NSTEMI/PCI: LM min irregs, LAD 97m (2.75x22 Resolute Onyx DES), 40d, D2 min irregs, LCX/LPAV min irregs, RCA 60ost, 40p/m, EF 45-50%; b. 11/2020 MV: EF 43%, small area of apical ischemia ->? over processing-->low risk.   Hyperlipidemia    Hypertension    Ischemic cardiomyopathy    a. 11/2019 LV Gram: EF 45-50%; b. 12/2019 Echo: EF 50% w/ mod mid-apical anteroseptal HK, GrI DD, nl RV fxn, mild Ao sclerosis.   Tobacco use 11/25/2019    Medications: No chronic anticoag PTA Aspirin 81 mg, Ticagrelor 90  mg  Assessment: 46 yo M with PMH HTN, HLD, CAD, NSTEMI (12/2019), unstable angina presents with chest pain. Troponins elevated at 256 and ECG shows possible STEMI, infarcts of indeterminate age, and sinus tachycardia. No chronic anticoag PTA noted from chart review. Hgb, Hct, PLT stable. S/p cath with progression of severe multivessel disease.    Date Time aPTT/HL Rate/Comment 12/20 0149 HL 0.19 Subtherapeutic  12/20 0855 HL 0.38 Therapeutic x 1 @ 1500 units/hr  12/20 1645 HL 0.41 Therapeutic x 2    12/21   0515   HL 0.37  Therapeutic X 3  12/22   0228    HL 0.25          SUBtherapeutic  12/22 0956 HL 0.35  Therapeutic.    Goal of Therapy:  Heparin level 0.3-0.7 units/ml Monitor platelets by anticoagulation protocol: Yes   Plan:  Heparin level is therapeutic. Will continue heparin at 1700 units/hr. Recheck heparin level in 6 hours. CBC daily while on heparin. Plan to transfer to [facility].   Ronnald Ramp, PharmD Clinical Pharmacist   02/14/2022 10:45 AM

## 2022-02-14 NOTE — Consult Note (Signed)
ANTICOAGULATION CONSULT NOTE - Initial Consult  Pharmacy Consult for heparin Indication: chest pain/ACS  No Known Allergies  Patient Measurements: Height: 6' (182.9 cm) Weight: 95.3 kg (210 lb) IBW/kg (Calculated) : 77.6 Heparin Dosing Weight: 95.3  Vital Signs: Temp: 97.4 F (36.3 C) (12/22 0247) Temp Source: Oral (12/21 2324) BP: 135/88 (12/22 0247) Pulse Rate: 68 (12/22 0247)  Labs: Recent Labs    02/11/22 1800 02/11/22 2018 02/12/22 0149 02/12/22 0540 02/12/22 0855 02/12/22 1645 02/13/22 0515 02/13/22 1915 02/13/22 1916 02/14/22 0228  HGB 15.1  --   --  14.5  --   --  14.2  --   --  14.1  HCT 44.0  --   --  42.5  --   --  41.9  --   --  40.8  PLT 325  --   --  295  --   --  296  --   --  286  APTT 28  --   --   --   --   --   --   --   --   --   LABPROT 13.9  --   --   --   --   --   --   --   --   --   INR 1.1  --   --   --   --   --   --   --   --   --   HEPARINUNFRC  --   --    < >  --  0.38 0.41 0.37  --   --  0.25*  CREATININE 1.34*  --   --   --   --   --   --   --  1.33* 1.59*  TROPONINIHS 256* 429*  --   --  839*  --   --  341*  --   --    < > = values in this interval not displayed.     Estimated Creatinine Clearance: 68.1 mL/min (A) (by C-G formula based on SCr of 1.59 mg/dL (H)).   Medical History: Past Medical History:  Diagnosis Date   CAD (coronary artery disease)    a. 11/2019 NSTEMI/PCI: LM min irregs, LAD 15m (2.75x22 Resolute Onyx DES), 40d, D2 min irregs, LCX/LPAV min irregs, RCA 60ost, 40p/m, EF 45-50%; b. 11/2020 MV: EF 43%, small area of apical ischemia ->? over processing-->low risk.   Hyperlipidemia    Hypertension    Ischemic cardiomyopathy    a. 11/2019 LV Gram: EF 45-50%; b. 12/2019 Echo: EF 50% w/ mod mid-apical anteroseptal HK, GrI DD, nl RV fxn, mild Ao sclerosis.   Tobacco use 11/25/2019    Medications: No chronic anticoag PTA Aspirin 81 mg, Ticagrelor 90 mg  Assessment: 46 yo M with PMH HTN, HLD, CAD, NSTEMI  (12/2019), unstable angina presents with chest pain. Troponins elevated at 256 and ECG shows possible STEMI, infarcts of indeterminate age, and sinus tachycardia. No chronic anticoag PTA noted from chart review. Hgb, Hct, PLT stable.  Date Time aPTT/HL Rate/Comment 12/20 0149 HL 0.19 Subtherapeutic  12/20 0855 HL 0.38 Therapeutic x 1 @ 1500 units/hr  12/20 1645 HL 0.41 Therapeutic x 2    12/21   0515   HL 0.37           Therapeutic X 3  12/22   0228    HL 0.25          SUBtherapeutic   Baseline Labs: aPTT - 28; INR -  1.1 Hgb - 15.1; Plts - 325  Goal of Therapy:  Heparin level 0.3-0.7 units/ml Monitor platelets by anticoagulation protocol: Yes   Plan:  12/22:  HL @ 0228 = 0.25, SUBtherapeutic  Will order heparin 1400 units IV X 1 bolus and increase drip rate to 1700 units/hr.  Will recheck HL 6 hrs after rate change.  -Daily CBC while on Heparin  Dontai Pember D, PharmD Clinical Pharmacist   02/14/2022 3:03 AM

## 2022-02-14 NOTE — Discharge Summary (Addendum)
Physician Discharge Summary  Logan Crawford:[id number] DOB: 25-Feb-1975 DOA: 02/11/2022  PCP: Merryl Hacker, No  Admit date: 02/11/2022  Discharge date: 02/17/22   Admitted From: Home. Disposition:  [place]   Recommendations for Outpatient Follow-up:  Follow up with PCP in 1-2 weeks Please obtain BMP/CBC in one week Patient is being transferred to [facility] for CABG .  Home Health:None Equipment/Devices:None  Discharge Condition: Stable CODE STATUS:Full code Diet recommendation: Heart Healthy   Brief [facility] Course: This 46 y.o. male with medical history significant for HTN, polysubstance abuse, tobacco use disorder, CAD s/p PCI in 04/2021, G1 DD, EF 60-65 on  10/2021, hospitalized from 11/16/21 to 11/19/21 with NSTEMI for which she underwent repeat Left cardiac cath on 9/25 showing significant two-vessel coronary artery disease which was  treated medically and on DAPT, He presents to the ED with chest pain typical to prior NSTEMI. EMS administered aspirin and sublingual nitro which relieved his pain but after 20 minutes the pain recurred. Troponin trending up 29> 256> 429>835.  EKG shows ST depression V1 to V3 and aVF. Chest x-ray no acute disease.  Patient is started on IV heparin and admitted for further evaluation.  Patient was evaluated by cardiology, recommended left heart cath. Patient underwent left heart cath 02/13/22 which showed recurrent instent restenosis in the LAD in the setting of continued cocaine use and poor compliance with antiplatelet medications.  Patient also has significant progression of the RCA disease.  Cardiologist recommended transfer to [facility] for CABG. Patient is being discharged to [facility] for CABG.  Discharge Diagnoses:  Principal Problem:   NSTEMI (non-ST elevated myocardial infarction) ([place]) Active Problems:   Polysubstance abuse ([place])   Hypertension   Hyperlipidemia   Tobacco use   Chronic diastolic CHF (congestive heart  failure) ([place])   Cocaine abuse ([place])  NSTEMI: CAD s/p PCI: Patient presented with typical chest pain like prior NSTEMI. LHC on 11/18/2021 showed significant two-vessel coronary artery disease. He was advised medical management only. Troponin trending up 29> 256> 429>835 Continue IV heparin.  Continue nitroglycerin infusion. Continue aspirin, Brilinta, Lipitor, carvedilol. Cardiology is consulted, Patient underwent left heart cath 12/21 which showed recurrent instent restenosis in the LAD in the setting of continued cocaine use and poor compliance with antiplatelet medications.  Patient also has significant progression of the RCA disease.   Cardiologist recommended transfer to [facility] for CABG.  Polysubstance abuse: Urine drug screen positive for cocaine. Counseled on quitting substance abuse.   Essential hypertension Continue losartan ,imdur and carvedilol.   Hyperlipidemia: Continue Lipitor   Tobacco use: Counseled on quit smoking.   Chronic diastolic CHF: Appears euvolemic. Last echo showed LVEF 60 to 65%. Continue Coreg and losartan.    Discharge Instructions  Discharge Instructions     AMB referral to Phase II Cardiac Rehabilitation   Complete by: As directed    Being transferred to [facility] for CVTS consult   Diagnosis:  NSTEMI Other     After initial evaluation and assessments completed: Virtual Based Care may be provided alone or in conjunction with Phase 2 Cardiac Rehab based on patient barriers.: Yes   Intensive Cardiac Rehabilitation (ICR) [place] location only OR Traditional Cardiac Rehabilitation (TCR) *If criteria for ICR are not met will enroll in TCR [facility] only): Yes   Call MD for:  difficulty breathing, headache or visual disturbances   Complete by: As directed    Call MD for:  persistant nausea and vomiting   Complete by:  As directed    Call MD for:  severe uncontrolled pain   Complete by: As directed    Diet - low sodium heart healthy   Complete by:  As directed    Diet Carb Modified   Complete by: As directed    Discharge instructions   Complete by: As directed    Patient is being transferred to [facility] for CABG versus complex PCI.   Increase activity slowly   Complete by: As directed       Allergies as of 02/17/2022   No Known Allergies      Medication List     STOP taking these medications    amLODipine 10 MG tablet Commonly known as: NORVASC   atorvastatin 80 MG tablet Commonly known as: LIPITOR   cephALEXin 500 MG capsule Commonly known as: Keflex   losartan 50 MG tablet Commonly known as: COZAAR   multivitamin tablet   pantoprazole 40 MG tablet Commonly known as: PROTONIX   ticagrelor 90 MG Tabs tablet Commonly known as: BRILINTA       TAKE these medications    albuterol 108 (90 Base) MCG/ACT inhaler Commonly known as: VENTOLIN HFA Inhale 2 puffs into the lungs every 6 (six) hours as needed for wheezing or shortness of breath.   aspirin EC 81 MG tablet Take 1 tablet (81 mg total) by mouth daily. Swallow whole.   carvedilol 12.5 MG tablet Commonly known as: COREG Take 1 tablet (12.5 mg total) by mouth 2 (two) times daily with a meal. What changed:  medication strength how much to take   carvedilol 25 MG tablet Commonly known as: COREG Take 1 tablet (25 mg total) by mouth 2 (two) times daily with a meal. What changed: You were already taking a medication with the same name, and this prescription was added. Make sure you understand how and when to take each.   isosorbide mononitrate 60 MG 24 hr tablet Commonly known as: IMDUR Take 1 tablet (60 mg total) by mouth daily. What changed:  medication strength how much to take   isosorbide mononitrate 60 MG 24 hr tablet Commonly known as: IMDUR Take 1 tablet (60 mg total) by mouth 2 (two) times daily. What changed: You were already taking a medication with the same name, and this prescription was added. Make sure you understand how and  when to take each.   nitroGLYCERIN 0.4 MG SL tablet Commonly known as: NITROSTAT Place 1 tablet (0.4 mg total) under the tongue every 5 (five) minutes as needed for chest pain (if chest pain not resolved after 3 doses, please call 911).         No Known Allergies  Consultations: Cardiology   Procedures/Studies: CARDIAC CATHETERIZATION  Result Date: 02/13/2022   Proximal to distal LAD stents: Prox LAD to Mid LAD lesion is 70% stenosed. Jailed 2nd Diag lesion is 80% stenosed.   Mid LAD-1 lesion is 80% stenosed. Mid LAD-2 lesion is 50% stenosed.  (All ISR).  Dist LAD lesion is 99% stenosed (at distal stent edge)   Prox RCA-1 lesion is 70% stenosed. Prox RCA-2 lesion is 70% stenosed.   Mid RCA-1 lesion is 70% stenosed.  Mid RCA-2 lesion is 95% stenosed (ulcerated flat lesion)   RV Branch lesion is 60% stenosed.   RPDA lesion is 60% stenosed.   LV end diastolic pressure is severely elevated.   There is no aortic valve stenosis. Progression of Severe Multivessel: - 2 potential culprit lesions: distal-mid RCA ulcerated 95% ruptured  plaque (mid #2), distal mid LAD stent edge 99% with diffuse disease elsewhere RCA:-proximal #1 70%, followed by proximal #2 70%, mid #1 1 70% and mid #2 95%, ostial PDA 60% LAD: Long stented area with proximal concentric 70% are, jailed D2 85% Zio, distal stent focal 80% followed by segmental 50% then 99% at distal edge. LCx-small OM1 and OM 2 with bifurcating OM 3, AVG circumflex with small branches Recommendation: Restart IV heparin 2 hours after sheath removal.  CVTS consultation which would mean transferred to [facility] and cardiology team was notified.  Transfer process has been initiated pending bed He was on IV NTG upon arrival, but this was discontinued and he has been pain-free.  May need to restart. Glenetta Hew, MD  ECHOCARDIOGRAM LIMITED  Result Date: 02/12/2022    ECHOCARDIOGRAM LIMITED REPORT   Patient Name:   SAIGE BUSBY Date  of Exam: 02/12/2022 Medical Rec #:  [id number]        Height:       72.0 in Accession #:    [id number]       Weight:       210.0 lb Date of Birth:  Jul 18, 1975       BSA:          2.175 m Patient Age:    46 years         BP:           137/92 mmHg Patient Gender: M                HR:           85 bpm. Exam Location:  [facility] Procedure: Limited Echo, Cardiac Doppler and Color Doppler Indications:     NSTEMI I21.4  History:         Patient has prior history of Echocardiogram examinations, most                  recent 11/16/2021. CAD; Risk Factors:Hypertension. Ischemic                  cardiomyopathy, tobacco abuse.  Sonographer:     Sherrie Sport Referring Phys:  [id number] Kate Sable Diagnosing Phys: Kate Sable MD  Sonographer Comments: Image quality was fair. IMPRESSIONS  1. Left ventricular ejection fraction, by estimation, is 60 to 65%. The left ventricle has normal function. The left ventricle has no regional wall motion abnormalities.  2. Right ventricular systolic function is normal. The right ventricular size is normal.  3. The mitral valve is normal in structure. Mild mitral valve regurgitation. FINDINGS  Left Ventricle: Left ventricular ejection fraction, by estimation, is 60 to 65%. The left ventricle has normal function. The left ventricle has no regional wall motion abnormalities. Right Ventricle: The right ventricular size is normal. No increase in right ventricular wall thickness. Right ventricular systolic function is normal. Left Atrium: Left atrial size was normal in size. Right Atrium: Right atrial size was normal in size. Pericardium: There is no evidence of pericardial effusion. Mitral Valve: The mitral valve is normal in structure. Mild mitral valve regurgitation. Pulmonic Valve: The pulmonic valve was not well visualized. Pulmonic valve regurgitation is not visualized. Additional Comments: Spectral Doppler performed. Color Doppler performed.  LEFT VENTRICLE PLAX 2D LVIDd:         4.10 cm  LVIDs:         2.50 cm LV PW:         1.30 cm LV IVS:  1.30 cm  Kate Sable MD Electronically signed by Kate Sable MD Signature Date/Time: 02/12/2022/1:56:15 PM    Final    DG Chest Port 1 View  Result Date: 02/11/2022 CLINICAL DATA:  Chest pain. EXAM: PORTABLE CHEST 1 VIEW COMPARISON:  November 16, 2021. FINDINGS: The heart size and mediastinal contours are within normal limits. Both lungs are clear. The visualized skeletal structures are unremarkable. IMPRESSION: No active disease. Electronically Signed   By: Marijo Conception M.D.   On: 02/11/2022 18:27   (Echo, Carotid, EGD, Colonoscopy, ERCP)    Subjective: Patient was seen and examined at bedside.  Overnight event noted. He continues to have chest pain.  Patient was given sublingual nitro. Patient had abnormal LHC,  awaiting transfer to [facility] for CABG.  Discharge Exam: Vitals:   02/17/22 0828 02/17/22 1139  BP: (!) 143/92 122/82  Pulse: 71 72  Resp: 18 20  Temp: 98.4 F (36.9 C) 98 F (36.7 C)  SpO2: 97% 94%   Vitals:   02/17/22 0055 02/17/22 0454 02/17/22 0828 02/17/22 1139  BP: 122/77 124/87 (!) 143/92 122/82  Pulse: 70 65 71 72  Resp: _0 Temp: 97.7 F (36.5 C) 98.4 F (36.9 C) 98.4 F (36.9 C) 98 F (36.7 C)  TempSrc:  Oral Oral Oral  SpO2: 96% 95% 97% 94%  Weight:      Height:        General: Pt is alert, awake, not in acute distress Cardiovascular: RRR, S1/S2 +, no rubs, no gallops Respiratory: CTA bilaterally, no wheezing, no rhonchi Abdominal: Soft, NT, ND, bowel sounds + Extremities: no edema, no cyanosis    The results of significant diagnostics from this hospitalization (including imaging, microbiology, ancillary and laboratory) are listed below for reference.     Microbiology: Recent Results (from the past 240 hour(s))  Resp panel by RT-PCR (RSV, Flu A&B, Covid) Anterior Nasal Swab     Status: None   Collection Time: 02/11/22  8:34 PM   Specimen: Anterior Nasal  Swab  Result Value Ref Range Status   SARS Coronavirus 2 by RT PCR NEGATIVE NEGATIVE Final    Comment: (NOTE) SARS-CoV-2 target nucleic acids are NOT DETECTED.  The SARS-CoV-2 RNA is generally detectable in upper respiratory specimens during the acute phase of infection. The lowest concentration of SARS-CoV-2 viral copies this assay can detect is 138 copies/mL. A negative result does not preclude SARS-Cov-2 infection and should not be used as the sole basis for treatment or other patient management decisions. A negative result may occur with  improper specimen collection/handling, submission of specimen other than nasopharyngeal swab, presence of viral mutation(s) within the areas targeted by this assay, and inadequate number of viral copies(<138 copies/mL). A negative result must be combined with clinical observations, patient history, and epidemiological information. The expected result is Negative.  Fact Sheet for Patients:  EntrepreneurPulse.com.au  Fact Sheet for Healthcare Providers:  IncredibleEmployment.be  This test is no t yet approved or cleared by the [place] FDA and  has been authorized for detection and/or diagnosis of SARS-CoV-2 by FDA under an Emergency Use Authorization (EUA). This EUA will remain  in effect (meaning this test can be used) for the duration of the COVID-19 declaration under Section 564(b)(1) of the Act, 21 U.S.C.section 360bbb-3(b)(1), unless the authorization is terminated  or revoked sooner.       Influenza A by PCR NEGATIVE NEGATIVE Final   Influenza B by PCR NEGATIVE NEGATIVE Final  Comment: (NOTE) The Xpert Xpress SARS-CoV-2/FLU/RSV plus assay is intended as an aid in the diagnosis of influenza from Nasopharyngeal swab specimens and should not be used as a sole basis for treatment. Nasal washings and aspirates are unacceptable for Xpert Xpress SARS-CoV-2/FLU/RSV testing.  Fact Sheet for  Patients: EntrepreneurPulse.com.au  Fact Sheet for Healthcare Providers: IncredibleEmployment.be  This test is not yet approved or cleared by the [place] FDA and has been authorized for detection and/or diagnosis of SARS-CoV-2 by FDA under an Emergency Use Authorization (EUA). This EUA will remain in effect (meaning this test can be used) for the duration of the COVID-19 declaration under Section 564(b)(1) of the Act, 21 U.S.C. section 360bbb-3(b)(1), unless the authorization is terminated or revoked.     Resp Syncytial Virus by PCR NEGATIVE NEGATIVE Final    Comment: (NOTE) Fact Sheet for Patients: EntrepreneurPulse.com.au  Fact Sheet for Healthcare Providers: IncredibleEmployment.be  This test is not yet approved or cleared by the [place] FDA and has been authorized for detection and/or diagnosis of SARS-CoV-2 by FDA under an Emergency Use Authorization (EUA). This EUA will remain in effect (meaning this test can be used) for the duration of the COVID-19 declaration under Section 564(b)(1) of the Act, 21 U.S.C. section 360bbb-3(b)(1), unless the authorization is terminated or revoked.  Performed at [facility], [address] [place], [place] [address]      Labs: BNP (last 3 results) Recent Labs    06/27/21 1707 11/17/21 0043  BNP 18.9 10.6   Basic Metabolic Panel: Recent Labs  Lab 02/13/22 1916 02/14/22 0228 02/15/22 0639 02/16/22 0559 02/17/22 0644  NA 136 138 136 138 138  K 4.0 4.1 4.9 4.6 4.6  CL 107 109 108 110 106  CO2 22 23 18* 22 23  GLUCOSE 141* 100* 70 83 95  BUN _0 CREATININE 1.33* 1.59* 1.44* 1.29* 1.47*  CALCIUM 8.5* 8.3* 8.9 8.9 9.1  MG  --  2.1  --   --   --   PHOS  --  4.5  --   --   --    Liver Function Tests: No results for input(s): "AST", "ALT", "ALKPHOS", "BILITOT", "PROT", "ALBUMIN" in the last 168 hours. No results for  input(s): "LIPASE", "AMYLASE" in the last 168 hours. No results for input(s): "AMMONIA" in the last 168 hours. CBC: Recent Labs  Lab 02/11/22 1800 02/12/22 0540 02/13/22 0515 02/14/22 0228 02/15/22 0646  WBC 9.2 8.0 7.7 6.9 7.5  HGB 15.1 14.5 14.2 14.1 14.7  HCT 44.0 42.5 41.9 40.8 43.3  MCV 88.0 88.7 89.3 87.9 89.5  PLT 325 295 296 286 271   Cardiac Enzymes: No results for input(s): "CKTOTAL", "CKMB", "CKMBINDEX", "TROPONINI" in the last 168 hours. BNP: Invalid input(s): "POCBNP" CBG: No results for input(s): "GLUCAP" in the last 168 hours. D-Dimer No results for input(s): "DDIMER" in the last 72 hours. Hgb A1c No results for input(s): "HGBA1C" in the last 72 hours. Lipid Profile No results for input(s): "CHOL", "HDL", "LDLCALC", "TRIG", "CHOLHDL", "LDLDIRECT" in the last 72 hours. Thyroid function studies No results for input(s): "TSH", "T4TOTAL", "T3FREE", "THYROIDAB" in the last 72 hours.  Invalid input(s): "FREET3" Anemia work up No results for input(s): "VITAMINB12", "FOLATE", "FERRITIN", "TIBC", "IRON", "RETICCTPCT" in the last 72 hours. Urinalysis    Component Value Date/Time   COLORURINE YELLOW (A) 11/25/2019 1759   APPEARANCEUR CLEAR (A) 11/25/2019 1759   APPEARANCEUR Clear 06/30/2013 1513   LABSPEC 1.018 11/25/2019 1759  LABSPEC 1.015 06/30/2013 1513   PHURINE 5.0 11/25/2019 1759   GLUCOSEU NEGATIVE 11/25/2019 1759   GLUCOSEU Negative 06/30/2013 1513   HGBUR SMALL (A) 11/25/2019 1759   BILIRUBINUR NEGATIVE 11/25/2019 1759   BILIRUBINUR Negative 06/30/2013 1513   KETONESUR NEGATIVE 11/25/2019 1759   PROTEINUR 30 (A) 11/25/2019 1759   NITRITE NEGATIVE 11/25/2019 1759   LEUKOCYTESUR NEGATIVE 11/25/2019 1759   LEUKOCYTESUR Negative 06/30/2013 1513   Sepsis Labs Recent Labs  Lab 02/12/22 0540 02/13/22 0515 02/14/22 0228 02/15/22 0646  WBC 8.0 7.7 6.9 7.5   Microbiology Recent Results (from the past 240 hour(s))  Resp panel by RT-PCR (RSV, Flu  A&B, Covid) Anterior Nasal Swab     Status: None   Collection Time: 02/11/22  8:34 PM   Specimen: Anterior Nasal Swab  Result Value Ref Range Status   SARS Coronavirus 2 by RT PCR NEGATIVE NEGATIVE Final    Comment: (NOTE) SARS-CoV-2 target nucleic acids are NOT DETECTED.  The SARS-CoV-2 RNA is generally detectable in upper respiratory specimens during the acute phase of infection. The lowest concentration of SARS-CoV-2 viral copies this assay can detect is 138 copies/mL. A negative result does not preclude SARS-Cov-2 infection and should not be used as the sole basis for treatment or other patient management decisions. A negative result may occur with  improper specimen collection/handling, submission of specimen other than nasopharyngeal swab, presence of viral mutation(s) within the areas targeted by this assay, and inadequate number of viral copies(<138 copies/mL). A negative result must be combined with clinical observations, patient history, and epidemiological information. The expected result is Negative.  Fact Sheet for Patients:  EntrepreneurPulse.com.au  Fact Sheet for Healthcare Providers:  IncredibleEmployment.be  This test is no t yet approved or cleared by the [place] FDA and  has been authorized for detection and/or diagnosis of SARS-CoV-2 by FDA under an Emergency Use Authorization (EUA). This EUA will remain  in effect (meaning this test can be used) for the duration of the COVID-19 declaration under Section 564(b)(1) of the Act, 21 U.S.C.section 360bbb-3(b)(1), unless the authorization is terminated  or revoked sooner.       Influenza A by PCR NEGATIVE NEGATIVE Final   Influenza B by PCR NEGATIVE NEGATIVE Final    Comment: (NOTE) The Xpert Xpress SARS-CoV-2/FLU/RSV plus assay is intended as an aid in the diagnosis of influenza from Nasopharyngeal swab specimens and should not be used as a sole basis for treatment.  Nasal washings and aspirates are unacceptable for Xpert Xpress SARS-CoV-2/FLU/RSV testing.  Fact Sheet for Patients: EntrepreneurPulse.com.au  Fact Sheet for Healthcare Providers: IncredibleEmployment.be  This test is not yet approved or cleared by the [place] FDA and has been authorized for detection and/or diagnosis of SARS-CoV-2 by FDA under an Emergency Use Authorization (EUA). This EUA will remain in effect (meaning this test can be used) for the duration of the COVID-19 declaration under Section 564(b)(1) of the Act, 21 U.S.C. section 360bbb-3(b)(1), unless the authorization is terminated or revoked.     Resp Syncytial Virus by PCR NEGATIVE NEGATIVE Final    Comment: (NOTE) Fact Sheet for Patients: EntrepreneurPulse.com.au  Fact Sheet for Healthcare Providers: IncredibleEmployment.be  This test is not yet approved or cleared by the [place] FDA and has been authorized for detection and/or diagnosis of SARS-CoV-2 by FDA under an Emergency Use Authorization (EUA). This EUA will remain in effect (meaning this test can be used) for the duration of the COVID-19 declaration under Section 564(b)(1) of the Act, 21  U.S.C. section 360bbb-3(b)(1), unless the authorization is terminated or revoked.  Performed at [facility], [address], [place], [place] [address]      Time coordinating discharge: Over 30 minutes  SIGNED:   Shawna Clamp, MD  [facility] 02/17/2022, 1:54 PM Pager   If 7PM-7AM, please contact night-coverage

## 2022-02-14 NOTE — TOC Initial Note (Signed)
Transition of Care [facility]) - Initial/Assessment Note    Patient Details  Name: Logan Crawford MRN: [id number] Date of Birth: 08-12-1975  Transition of [facility]) CM/SW Contact:    Truddie Hidden, RN Phone Number: 02/14/2022, 12:40 PM  Clinical Narrative:                 Patient transferring to [facility] for LHC.   TOC signing off.         Patient Goals and CMS Choice            Expected Discharge Plan and Services         Expected Discharge Date: 02/14/22                                    Prior Living Arrangements/Services                       Activities of Daily Living Home Assistive Devices/Equipment: None ADL Screening (condition at time of admission) Patient's cognitive ability adequate to safely complete daily activities?: Yes Is the patient deaf or have difficulty hearing?: No Does the patient have difficulty seeing, even when wearing glasses/contacts?: No Does the patient have difficulty concentrating, remembering, or making decisions?: No Patient able to express need for assistance with ADLs?: Yes Does the patient have difficulty dressing or bathing?: No Independently performs ADLs?: Yes (appropriate for developmental age) Does the patient have difficulty walking or climbing stairs?: No Weakness of Legs: None Weakness of Arms/Hands: None  Permission Sought/Granted                  Emotional Assessment              Admission diagnosis:  NSTEMI (non-ST elevated myocardial infarction) (HCC) [I21.4] Chest pain due to myocardial ischemia, unspecified ischemic chest pain type [I25.9] Hypertension, unspecified type [I10] Patient Active Problem List   Diagnosis Date Noted   Cocaine abuse (HCC) 02/13/2022   Chronic diastolic CHF (congestive heart failure) (HCC) 02/11/2022   Polysubstance abuse (HCC) 11/17/2021   Unstable angina (HCC) 11/16/2021   Noncompliance 11/16/2021   Chest pain 05/18/2021   Hypertensive urgency  05/18/2021   CAD (coronary artery disease) 05/18/2021   Loud snoring 03/28/2020   Gastroesophageal reflux disease without esophagitis    AKI (acute kidney injury) (HCC)    NSTEMI (non-ST elevated myocardial infarction) (HCC) 01/24/2020   Tobacco use 11/25/2019   COVID-19 virus infection 11/25/2019   Abnormal EKG 11/25/2019   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/25/2019   Penile lesion 11/25/2019   Overweight with body mass index (BMI) of 27 to 27.9 in adult    Hyperlipidemia 07/10/2016   Hypertension 10/20/2014   PCP:  Oneita Hurt No Pharmacy:   [facility] [address], [place] - [address] [place] [address] Phone: [phone] Fax: [phone]  [facility] [address] [place] [address] Phone: [phone] Fax: [phone]     Social Determinants of Health (SDOH) Social History: SDOH Screenings   Food Insecurity: No Food Insecurity (02/12/2022)  Housing: Low Risk  (02/12/2022)  Transportation Needs: No Transportation Needs (02/12/2022)  Utilities: Not At Risk (02/12/2022)  Tobacco Use: Medium Risk (02/12/2022)   SDOH Interventions:     Readmission Risk Interventions     No data to display

## 2022-02-14 NOTE — Progress Notes (Signed)
Rounding Note    Patient Name: Logan Crawford Date of Encounter: 02/14/2022  [facility] Cardiologist: Debbe Odea, MD   Subjective   Seen on AM rounds. Denies any shortness of breath. Has had two episodes of chest pain over night described as a tightening sensation. Treated with morphine and nitro.Remains on heparin drip. Awaiting transfer to [facility].   Inpatient Medications    Scheduled Meds:  aspirin EC  81 mg Oral Daily   atorvastatin  80 mg Oral Daily   carvedilol  12.5 mg Oral BID WC   isosorbide mononitrate  60 mg Oral Daily   losartan  25 mg Oral Daily   pantoprazole  40 mg Oral Daily   sodium chloride flush  3 mL Intravenous Q12H   sodium chloride flush  3 mL Intravenous Q12H   Continuous Infusions:  sodium chloride     sodium chloride     sodium chloride 1 mL/kg/hr (02/13/22 1323)   heparin 1,700 Units/hr (02/14/22 0306)   nitroGLYCERIN     PRN Meds: sodium chloride, sodium chloride, acetaminophen **OR** acetaminophen, acetaminophen, albuterol, morphine injection, nitroGLYCERIN, ondansetron **OR** ondansetron (ZOFRAN) IV, sodium chloride flush, sodium chloride flush   Vital Signs    Vitals:   02/14/22 0247 02/14/22 0323 02/14/22 0328 02/14/22 0336  BP: 135/88 (!) 151/93 (!) 145/91 (!) 138/95  Pulse: 68  81 76  Resp: 18 (!) 26 18 16   Temp: (!) 97.4 F (36.3 C)     TempSrc:      SpO2: 98% 97% 98% 97%  Weight:      Height:        Intake/Output Summary (Last 24 hours) at 02/14/2022 0741 Last data filed at 02/14/2022 02/16/2022 Gross per 24 hour  Intake 358.14 ml  Output --  Net 358.14 ml      02/13/2022    1:15 PM 02/11/2022    5:49 PM 11/21/2021   10:24 AM  Last 3 Weights  Weight (lbs) 210 lb 210 lb 225 lb 12.8 oz  Weight (kg) 95.255 kg 95.255 kg 102.422 kg      Telemetry    Sinus with ST depression rates from 60-70 - Personally Reviewed  ECG    No new tracings - Personally Reviewed  Physical Exam   GEN: No acute  distress.   Neck: No JVD Cardiac: RRR, no murmurs, rubs, or gallops. Right radial cath site with gauze and opsite dressing clean, dry, and intact, 1+ radial pulse, no bleeding or hematoma noted from site, mild tenderness noted with palpitation Respiratory: Clear to auscultation bilaterally.Respirations are unlabored on room air. GI: Soft, nontender, non-distended  MS: No edema; No deformity. Neuro:  Nonfocal  Psych: Normal affect   Labs    High Sensitivity Troponin:   Recent Labs  Lab 02/11/22 1800 02/11/22 2018 02/12/22 0855 02/13/22 1915  TROPONINIHS 256* 429* 839* 341*     Chemistry Recent Labs  Lab 02/11/22 1800 02/13/22 1916 02/14/22 0228  NA 139 136 138  K 3.8 4.0 4.1  CL 107 107 109  CO2 24 22 23   GLUCOSE 143* 141* 100*  BUN 13 14 15   CREATININE 1.34* 1.33* 1.59*  CALCIUM 9.2 8.5* 8.3*  MG  --   --  2.1  GFRNONAA >60 >60 53*  ANIONGAP 8 7 6     Lipids No results for input(s): "CHOL", "TRIG", "HDL", "LABVLDL", "LDLCALC", "CHOLHDL" in the last 168 hours.  Hematology Recent Labs  Lab 02/12/22 0540 02/13/22 0515 02/14/22 0228  WBC  8.0 7.7 6.9  RBC 4.79 4.69 4.64  HGB 14.5 14.2 14.1  HCT 42.5 41.9 40.8  MCV 88.7 89.3 87.9  MCH 30.3 30.3 30.4  MCHC 34.1 33.9 34.6  RDW 13.7 13.9 14.0  PLT 295 296 286   Thyroid No results for input(s): "TSH", "FREET4" in the last 168 hours.  BNPNo results for input(s): "BNP", "PROBNP" in the last 168 hours.  DDimer No results for input(s): "DDIMER" in the last 168 hours.   Radiology      Cardiac Studies  LHC 02/13/22   Proximal to distal LAD stents: Prox LAD to Mid LAD lesion is 70% stenosed. Jailed 2nd Diag lesion is 80% stenosed.   Mid LAD-1 lesion is 80% stenosed. Mid LAD-2 lesion is 50% stenosed.  (All ISR).  Dist LAD lesion is 99% stenosed (at distal stent edge)   Prox RCA-1 lesion is 70% stenosed. Prox RCA-2 lesion is 70% stenosed.   Mid RCA-1 lesion is 70% stenosed.  Mid RCA-2 lesion is 95% stenosed (ulcerated  flat lesion)   RV Branch lesion is 60% stenosed.   RPDA lesion is 60% stenosed.   LV end diastolic pressure is severely elevated.   There is no aortic valve stenosis.   Progression of Severe Multivessel: - 2 potential culprit lesions: distal-mid RCA ulcerated 95% ruptured plaque (mid #2), distal mid LAD stent edge 99% with diffuse disease elsewhere RCA:-proximal #1 70%, followed by proximal #2 70%, mid #1 1 70% and mid #2 95%, ostial PDA 60% LAD: Long stented area with proximal concentric 70% are, jailed D2 85% Zio, distal stent focal 80% followed by segmental 50% then 99% at distal edge. LCx-small OM1 and OM 2 with bifurcating OM 3, AVG circumflex with small branches      Recommendation: Restart IV heparin 2 hours after sheath removal.  CVTS consultation which would mean transferred to [facility]  Hospitalist and cardiology team was notified.  Transfer process has been initiated pending bed    Diagnostic Dominance: Co-dominant   TTE 02/12/22 1. Left ventricular ejection fraction, by estimation, is 60 to 65%. The  left ventricle has normal function. The left ventricle has no regional  wall motion abnormalities.   2. Right ventricular systolic function is normal. The right ventricular  size is normal.   3. The mitral valve is normal in structure. Mild mitral valve  regurgitation.   Patient Profile     46 y.o. male with a history of CAD/PCI x 2 (mid LAD 2021, and 04/3021), hypertension, polysubstance abuse with cocaine and tobacco, who was seen and evaluated for NSTEMI and is not awaiting transfer to [facility]   Assessment & Plan    Coronary artery disease, prior PCI of the mid LAD/ NSTEMI -presented with several hours of chest pain -HS troponins peaked at 839 -was started on heparin and nitro drips -nitro drip placed on hold prior to cath as patient was pain free -known CAD with PCI to the LAD in 2021, repeat intervention in 04/2021, had diffuse 50% disease in the RCA  as well as 60% in the second diagonal branch -echo revealed LVEF 60-65%, no RWMA, and mild MR -left heart catheterization completed 02/13/22 with progression of multivessel disease -continued on heparin drip -awaiting transfer to [facility] for evaluation by [facility] -continued on asa, atorvastatin, imdur -Brilinta remains on hold pending surgery (to allow for minimum 5 day wash-out) -had continued chest pain overnight, given morphine and nitrostat, if continued recurrence restart Nitro drip  Essential hypertension -  blood pressure 138/95 (0336) -continued on coreg 12.5 mg bid, losartan 25 mg daily, imdur 60 mg daily -likely pain response from bouts of chest pain, waiting on AM vitals to be completed -vitals per unit protocol  Polysubstance abuse of cocaine and tobacco -urine drug screen positive for cocaine -recommend total cessation -continues to smoke a few cigarettes a day (3-5)  Hyperlipidemia -LDL 93 in 10/2021 -continued on atorvastatin 80 mg daily  Medication nonadherence -patient reported lost and not taken medications he was previously discharged with in 10/2021 -long discussion had about importance of prescribed medications     For questions or updates, please contact  [facility] Please consult [email or web address] for contact info under        Signed, Dandrae Kustra, NP  02/14/2022, 7:41 AM

## 2022-02-15 LAB — BASIC METABOLIC PANEL
Anion gap: 10 (ref 5–15)
BUN: 13 mg/dL (ref 6–20)
CO2: 18 mmol/L — ABNORMAL LOW (ref 22–32)
Calcium: 8.9 mg/dL (ref 8.9–10.3)
Chloride: 108 mmol/L (ref 98–111)
Creatinine, Ser: 1.44 mg/dL — ABNORMAL HIGH (ref 0.61–1.24)
GFR, Estimated: 60 mL/min — ABNORMAL LOW (ref >60)
Glucose, Bld: 70 mg/dL (ref 70–99)
Potassium: 4.9 mmol/L (ref 3.5–5.1)
Sodium: 136 mmol/L (ref 135–145)

## 2022-02-15 LAB — CBC
HCT: 43.3 % (ref 39.0–52.0)
Hemoglobin: 14.7 g/dL (ref 13.0–17.0)
MCH: 30.4 pg (ref 26.0–34.0)
MCHC: 33.9 g/dL (ref 30.0–36.0)
MCV: 89.5 fL (ref 80.0–100.0)
Platelets: 271 10*3/uL (ref 150–400)
RBC: 4.84 MIL/uL (ref 4.22–5.81)
RDW: 14.2 % (ref 11.5–15.5)
WBC: 7.5 10*3/uL (ref 4.0–10.5)
nRBC: 0 % (ref 0.0–0.2)

## 2022-02-15 LAB — HEPARIN LEVEL (UNFRACTIONATED): Heparin Unfractionated: 0.42 IU/mL (ref 0.30–0.70)

## 2022-02-15 MED ORDER — CARVEDILOL 25 MG PO TABS
25.0000 mg | ORAL_TABLET | Freq: Two times a day (BID) | ORAL | Status: DC
  Administered 2022-02-15 – 2022-02-17 (×4): 25 mg via ORAL
  Filled 2022-02-15 (×4): qty 1

## 2022-02-15 MED ORDER — ORAL CARE MOUTH RINSE: 15.0000 mL | OROMUCOSAL | Status: DC | PRN

## 2022-02-15 MED ORDER — ISOSORBIDE MONONITRATE ER 60 MG PO TB24
60.0000 mg | ORAL_TABLET | Freq: Two times a day (BID) | ORAL | Status: DC
  Administered 2022-02-15 – 2022-02-17 (×4): 60 mg via ORAL
  Filled 2022-02-15 (×4): qty 1

## 2022-02-15 NOTE — Progress Notes (Signed)
PROGRESS NOTE    Logan Crawford  JIR:[id number] DOB: September 14, 1975 DOA: 02/11/2022 PCP: Pcp, No    Brief Narrative:  This 46 y.o. male with medical history significant for HTN, polysubstance abuse, tobacco use disorder, CAD s/p PCI in 04/2021, G1 DD, EF 60-65 on  10/2021, hospitalized from 11/16/21 to 11/19/21 with NSTEMI for which she underwent repeat Left cardiac cath on 9/25 showing significant two-vessel coronary artery disease which was  treated medically and on DAPT, He presents to the ED with chest pain typical to prior NSTEMI. EMS administered aspirin and sublingual nitro which relieved his pain but after 20 minutes the pain recurred. Troponin trending up 29> 256> 429>835.  EKG shows ST depression V1 to V3 and aVF. Chest x-ray no acute disease.  Patient is started on IV heparin and admitted for further evaluation.  Patient was evaluated by cardiology, recommended left heart cath. Patient underwent left heart cath 02/13/22 which showed recurrent in-stent restenosis in the LAD in the setting of continued cocaine use and poor compliance with antiplatelet medications.  Patient also has significant progression of the RCA disease.  Cardiologist recommended transfer to [facility] for CABG.  Assessment & Plan:   Principal Problem:   NSTEMI (non-ST elevated myocardial infarction) (HCC) Active Problems:   Polysubstance abuse (HCC)   Hypertension   Hyperlipidemia   Tobacco use   Chronic diastolic CHF (congestive heart failure) (HCC)   Cocaine abuse (HCC)  NSTEMI: CAD s/p PCI: Patient presented with typical chest pain like prior NSTEMI. LHC on 11/18/2021 showed significant two-vessel coronary artery disease. He was advised medical management only. Troponin trending up 29> 256> 429>835 Continue IV heparin.  Continue nitroglycerin infusion. Continue aspirin, Brilinta, Lipitor, carvedilol. Cardiology is consulted, Patient underwent left heart cath 12/21 which showed recurrent instent restenosis  in the LAD in the setting of continued cocaine use and poor compliance with antiplatelet medications.  Patient also has significant progression of the RCA disease.   Cardiologist recommended transfer to [facility] for CABG.   Polysubstance abuse: Urine drug screen positive for cocaine. Counseled on quitting substance abuse.   Essential hypertension Continue losartan ,imdur and carvedilol.   Hyperlipidemia: Continue Lipitor   Tobacco use: Counseled on quit smoking.   Chronic diastolic CHF: Appears euvolemic. Last echo showed LVEF 60 to 65%. Continue Coreg and losartan.   DVT prophylaxis: Heparin iv Code Status: Full code Family Communication: No family at bed side. Disposition Plan:    Status is: Inpatient Remains inpatient appropriate because:   Awaiting transfer to [facility] for CABG, given severe diffuse coronary artery disease    Consultants:  Cardiology  Procedures: LHC Antimicrobials: None  Subjective: Patient was seen and examined at bedside.  Overnight events noted.   Patient reported having pain overnight but got better with Imdur. Denies any chest pain at this time,  awaiting transfer to [facility] for CABG.  Objective: Vitals:   02/15/22 0403 02/15/22 0816 02/15/22 0823 02/15/22 0840  BP: 125/68 (!) 154/101 (!) 136/103   Pulse: 69 72  75  Resp: 19 18  16   Temp: 98 F (36.7 C) 98.2 F (36.8 C)  97.9 F (36.6 C)  TempSrc: Oral Oral    SpO2: 97% 100%  97%  Weight:      Height:        Intake/Output Summary (Last 24 hours) at 02/15/2022 1130 Last data filed at 02/15/2022 0824 Gross per 24 hour  Intake 1418.46 ml  Output --  Net 1418.46 ml  Filed Weights   02/11/22 1749 02/13/22 1315  Weight: 95.3 kg 95.3 kg    Examination:  General exam: Appears comfortable, deconditioned, not in any distress. Respiratory system: CTA bilaterally, respiratory effort normal, RR 15. Cardiovascular system: S1 & S2 heard, regular rate and rhythm, no  murmur. Gastrointestinal system: Abdomen is soft, non tender, non distended, BS+ Central nervous system: Alert and oriented x 3. No focal neurological deficits. Extremities: No edema, no cyanosis, no clubbing. Skin: No rashes, lesions or ulcers Psychiatry: Judgement and insight appear normal. Mood & affect appropriate.     Data Reviewed: I have personally reviewed following labs and imaging studies  CBC: Recent Labs  Lab 02/11/22 1800 02/12/22 0540 02/13/22 0515 02/14/22 0228 02/15/22 0646  WBC 9.2 8.0 7.7 6.9 7.5  HGB 15.1 14.5 14.2 14.1 14.7  HCT 44.0 42.5 41.9 40.8 43.3  MCV 88.0 88.7 89.3 87.9 89.5  PLT 325 295 296 286 271   Basic Metabolic Panel: Recent Labs  Lab 02/11/22 1800 02/13/22 1916 02/14/22 0228 02/15/22 0639  NA 139 136 138 136  K 3.8 4.0 4.1 4.9  CL 107 107 109 108  CO2 24 22 23  18*  GLUCOSE 143* 141* 100* 70  BUN 13 14 15 13   CREATININE 1.34* 1.33* 1.59* 1.44*  CALCIUM 9.2 8.5* 8.3* 8.9  MG  --   --  2.1  --   PHOS  --   --  4.5  --    GFR: Estimated Creatinine Clearance: 75.2 mL/min (A) (by C-G formula based on SCr of 1.44 mg/dL (H)). Liver Function Tests: No results for input(s): "AST", "ALT", "ALKPHOS", "BILITOT", "PROT", "ALBUMIN" in the last 168 hours. No results for input(s): "LIPASE", "AMYLASE" in the last 168 hours. No results for input(s): "AMMONIA" in the last 168 hours. Coagulation Profile: Recent Labs  Lab 02/11/22 1800  INR 1.1   Cardiac Enzymes: No results for input(s): "CKTOTAL", "CKMB", "CKMBINDEX", "TROPONINI" in the last 168 hours. BNP (last 3 results) No results for input(s): "PROBNP" in the last 8760 hours. HbA1C: No results for input(s): "HGBA1C" in the last 72 hours. CBG: No results for input(s): "GLUCAP" in the last 168 hours. Lipid Profile: No results for input(s): "CHOL", "HDL", "LDLCALC", "TRIG", "CHOLHDL", "LDLDIRECT" in the last 72 hours. Thyroid Function Tests: No results for input(s): "TSH", "T4TOTAL",  "FREET4", "T3FREE", "THYROIDAB" in the last 72 hours. Anemia Panel: No results for input(s): "VITAMINB12", "FOLATE", "FERRITIN", "TIBC", "IRON", "RETICCTPCT" in the last 72 hours. Sepsis Labs: No results for input(s): "PROCALCITON", "LATICACIDVEN" in the last 168 hours.  Recent Results (from the past 240 hour(s))  Resp panel by RT-PCR (RSV, Flu A&B, Covid) Anterior Nasal Swab     Status: None   Collection Time: 02/11/22  8:34 PM   Specimen: Anterior Nasal Swab  Result Value Ref Range Status   SARS Coronavirus 2 by RT PCR NEGATIVE NEGATIVE Final    Comment: (NOTE) SARS-CoV-2 target nucleic acids are NOT DETECTED.  The SARS-CoV-2 RNA is generally detectable in upper respiratory specimens during the acute phase of infection. The lowest concentration of SARS-CoV-2 viral copies this assay can detect is 138 copies/mL. A negative result does not preclude SARS-Cov-2 infection and should not be used as the sole basis for treatment or other patient management decisions. A negative result may occur with  improper specimen collection/handling, submission of specimen other than nasopharyngeal swab, presence of viral mutation(s) within the areas targeted by this assay, and inadequate number of viral copies(<138 copies/mL). A negative result must  be combined with clinical observations, patient history, and epidemiological information. The expected result is Negative.  Fact Sheet for Patients:  BloggerCourse.comhttps://www.fda.gov/media/152166/download  Fact Sheet for Healthcare Providers:  SeriousBroker.ithttps://www.fda.gov/media/152162/download  This test is no t yet approved or cleared by the [place] FDA and  has been authorized for detection and/or diagnosis of SARS-CoV-2 by FDA under an Emergency Use Authorization (EUA). This EUA will remain  in effect (meaning this test can be used) for the duration of the COVID-19 declaration under Section 564(b)(1) of the Act, 21 U.S.C.section 360bbb-3(b)(1), unless the  authorization is terminated  or revoked sooner.       Influenza A by PCR NEGATIVE NEGATIVE Final   Influenza B by PCR NEGATIVE NEGATIVE Final    Comment: (NOTE) The Xpert Xpress SARS-CoV-2/FLU/RSV plus assay is intended as an aid in the diagnosis of influenza from Nasopharyngeal swab specimens and should not be used as a sole basis for treatment. Nasal washings and aspirates are unacceptable for Xpert Xpress SARS-CoV-2/FLU/RSV testing.  Fact Sheet for Patients: BloggerCourse.comhttps://www.fda.gov/media/152166/download  Fact Sheet for Healthcare Providers: SeriousBroker.ithttps://www.fda.gov/media/152162/download  This test is not yet approved or cleared by the [place] FDA and has been authorized for detection and/or diagnosis of SARS-CoV-2 by FDA under an Emergency Use Authorization (EUA). This EUA will remain in effect (meaning this test can be used) for the duration of the COVID-19 declaration under Section 564(b)(1) of the Act, 21 U.S.C. section 360bbb-3(b)(1), unless the authorization is terminated or revoked.     Resp Syncytial Virus by PCR NEGATIVE NEGATIVE Final    Comment: (NOTE) Fact Sheet for Patients: BloggerCourse.comhttps://www.fda.gov/media/152166/download  Fact Sheet for Healthcare Providers: SeriousBroker.ithttps://www.fda.gov/media/152162/download  This test is not yet approved or cleared by the [place] FDA and has been authorized for detection and/or diagnosis of SARS-CoV-2 by FDA under an Emergency Use Authorization (EUA). This EUA will remain in effect (meaning this test can be used) for the duration of the COVID-19 declaration under Section 564(b)(1) of the Act, 21 U.S.C. section 360bbb-3(b)(1), unless the authorization is terminated or revoked.  Performed at [facility], [address] [place], [place] [id number]      Radiology Studies: CARDIAC CATHETERIZATION  Result Date: 02/13/2022   Proximal to distal LAD stents: Prox LAD to Mid LAD lesion is 70% stenosed. Jailed 2nd Diag lesion  is 80% stenosed.   Mid LAD-1 lesion is 80% stenosed. Mid LAD-2 lesion is 50% stenosed.  (All ISR).  Dist LAD lesion is 99% stenosed (at distal stent edge)   Prox RCA-1 lesion is 70% stenosed. Prox RCA-2 lesion is 70% stenosed.   Mid RCA-1 lesion is 70% stenosed.  Mid RCA-2 lesion is 95% stenosed (ulcerated flat lesion)   RV Branch lesion is 60% stenosed.   RPDA lesion is 60% stenosed.   LV end diastolic pressure is severely elevated.   There is no aortic valve stenosis. Progression of Severe Multivessel: - 2 potential culprit lesions: distal-mid RCA ulcerated 95% ruptured plaque (mid #2), distal mid LAD stent edge 99% with diffuse disease elsewhere RCA:-proximal #1 70%, followed by proximal #2 70%, mid #1 1 70% and mid #2 95%, ostial PDA 60% LAD: Long stented area with proximal concentric 70% are, jailed D2 85% Zio, distal stent focal 80% followed by segmental 50% then 99% at distal edge. LCx-small OM1 and OM 2 with bifurcating OM 3, AVG circumflex with small branches Recommendation: Restart IV heparin 2 hours after sheath removal.  CVTS consultation which [facility].  Hospitalist  and cardiology team was notified.  Transfer process has been initiated pending bed He was on IV NTG upon arrival, but this was discontinued and he has been pain-free.  May need to restart. Bryan Lemma, MD    Scheduled Meds:  aspirin EC  81 mg Oral Daily   atorvastatin  80 mg Oral Daily   carvedilol  25 mg Oral BID WC   isosorbide mononitrate  60 mg Oral BID   pantoprazole  40 mg Oral Daily   sodium chloride flush  3 mL Intravenous Q12H   sodium chloride flush  3 mL Intravenous Q12H   Continuous Infusions:  sodium chloride     sodium chloride     sodium chloride 1 mL/kg/hr (02/13/22 1323)   heparin 1,700 Units/hr (02/15/22 0311)   nitroGLYCERIN       LOS: 4 days    Time spent: 35 mins    Roy Tokarz, MD [facility]   If 7PM-7AM, please contact night-coverage

## 2022-02-15 NOTE — Progress Notes (Addendum)
Rounding Note    Patient Name: Logan Crawford Date of Encounter: 02/15/2022  [facility] Cardiologist: Debbe Odea, MD   Subjective   Patient seen on AM rounds. Denies any current chest pain but did have one episode overnight. Denies any shortness of breath. Remains on Heparin infusion awaiting transport to [facility]  Inpatient Medications    Scheduled Meds:  aspirin EC  81 mg Oral Daily   atorvastatin  80 mg Oral Daily   carvedilol  12.5 mg Oral BID WC   isosorbide mononitrate  60 mg Oral Daily   losartan  25 mg Oral Daily   nitroGLYCERIN  0.5 inch Topical Q6H   pantoprazole  40 mg Oral Daily   sodium chloride flush  3 mL Intravenous Q12H   sodium chloride flush  3 mL Intravenous Q12H   Continuous Infusions:  sodium chloride     sodium chloride     sodium chloride 1 mL/kg/hr (02/13/22 1323)   heparin 1,700 Units/hr (02/15/22 0311)   nitroGLYCERIN     PRN Meds: sodium chloride, sodium chloride, acetaminophen **OR** acetaminophen, albuterol, hydrALAZINE, morphine injection, nitroGLYCERIN, ondansetron **OR** ondansetron (ZOFRAN) IV, mouth rinse, sodium chloride flush, sodium chloride flush   Vital Signs    Vitals:   02/15/22 0403 02/15/22 0816 02/15/22 0823 02/15/22 0840  BP: 125/68 (!) 154/101 (!) 136/103   Pulse: 69 72  75  Resp: 19 18  16   Temp: 98 F (36.7 C) 98.2 F (36.8 C)  97.9 F (36.6 C)  TempSrc: Oral Oral    SpO2: 97% 100%  97%  Weight:      Height:        Intake/Output Summary (Last 24 hours) at 02/15/2022 0913 Last data filed at 02/15/2022 0824 Gross per 24 hour  Intake 1298.46 ml  Output --  Net 1298.46 ml      02/13/2022    1:15 PM 02/11/2022    5:49 PM 11/21/2021   10:24 AM  Last 3 Weights  Weight (lbs) 210 lb 210 lb 225 lb 12.8 oz  Weight (kg) 95.255 kg 95.255 kg 102.422 kg      Telemetry    Sinus rate 60-70 with 1 four beat run of NSVT - Personally Reviewed  ECG    No new tracings - Personally  Reviewed  Physical Exam   GEN: No acute distress.   Neck: No JVD Cardiac: RRR, no murmurs, rubs, or gallops. Right radial cath site 1+ pulse, mild tenderness to palpitation without bleeding or hematoma Respiratory: Clear to auscultation bilaterally.Respirations are unlabored on room air. GI: Soft, nontender, non-distended  MS: No edema; No deformity. Neuro:  Nonfocal  Psych: Normal affect   Labs    High Sensitivity Troponin:   Recent Labs  Lab 02/11/22 1800 02/11/22 2018 02/12/22 0855 02/13/22 1915  TROPONINIHS 256* 429* 839* 341*     Chemistry Recent Labs  Lab 02/11/22 1800 02/13/22 1916 02/14/22 0228  NA 139 136 138  K 3.8 4.0 4.1  CL 107 107 109  CO2 24 22 23   GLUCOSE 143* 141* 100*  BUN 13 14 15   CREATININE 1.34* 1.33* 1.59*  CALCIUM 9.2 8.5* 8.3*  MG  --   --  2.1  GFRNONAA >60 >60 53*  ANIONGAP 8 7 6     Lipids No results for input(s): "CHOL", "TRIG", "HDL", "LABVLDL", "LDLCALC", "CHOLHDL" in the last 168 hours.  Hematology Recent Labs  Lab 02/13/22 0515 02/14/22 0228 02/15/22 0646  WBC 7.7 6.9 7.5  RBC  4.69 4.64 4.84  HGB 14.2 14.1 14.7  HCT 41.9 40.8 43.3  MCV 89.3 87.9 89.5  MCH 30.3 30.4 30.4  MCHC 33.9 34.6 33.9  RDW 13.9 14.0 14.2  PLT 296 286 271   Thyroid No results for input(s): "TSH", "FREET4" in the last 168 hours.  BNPNo results for input(s): "BNP", "PROBNP" in the last 168 hours.  DDimer No results for input(s): "DDIMER" in the last 168 hours.   Radiology      Cardiac Studies   LHC 02/13/22   Proximal to distal LAD stents: Prox LAD to Mid LAD lesion is 70% stenosed. Jailed 2nd Diag lesion is 80% stenosed.   Mid LAD-1 lesion is 80% stenosed. Mid LAD-2 lesion is 50% stenosed.  (All ISR).  Dist LAD lesion is 99% stenosed (at distal stent edge)   Prox RCA-1 lesion is 70% stenosed. Prox RCA-2 lesion is 70% stenosed.   Mid RCA-1 lesion is 70% stenosed.  Mid RCA-2 lesion is 95% stenosed (ulcerated flat lesion)   RV Branch lesion is  60% stenosed.   RPDA lesion is 60% stenosed.   LV end diastolic pressure is severely elevated.   There is no aortic valve stenosis.   Progression of Severe Multivessel: - 2 potential culprit lesions: distal-mid RCA ulcerated 95% ruptured plaque (mid #2), distal mid LAD stent edge 99% with diffuse disease elsewhere RCA:-proximal #1 70%, followed by proximal #2 70%, mid #1 1 70% and mid #2 95%, ostial PDA 60% LAD: Long stented area with proximal concentric 70% are, jailed D2 85% Zio, distal stent focal 80% followed by segmental 50% then 99% at distal edge. LCx-small OM1 and OM 2 with bifurcating OM 3, AVG circumflex with small branches      Recommendation: Restart IV heparin 2 hours after sheath removal.  CVTS consultation which would mean transferred to [facility].  Hospitalist and cardiology team was notified.  Transfer process has been initiated pending bed    Diagnostic Dominance: Co-dominant    TTE 02/12/22 1. Left ventricular ejection fraction, by estimation, is 60 to 65%. The  left ventricle has normal function. The left ventricle has no regional  wall motion abnormalities.   2. Right ventricular systolic function is normal. The right ventricular  size is normal.   3. The mitral valve is normal in structure. Mild mitral valve  regurgitation.   Patient Profile     46 y.o. male with a history of CAD/PCI x 2 (mid LAD 2021 and 04/2021)hypertension, polysubstance abuse with cocaine and tobacco, who is being seen and evaluated for NSTEMI and is awaiting transfer to [facility].  Assessment & Plan    CAD, prior PCI of the mid LAD/NSTEMI -presented with several hours of chest pain -HS troponins peaked at 839 -remains on Heparin drip -daily cbc while on heparin -known CAD with PCI to LAD in 2021, repeat intervention 04/2021, had diffuse 50% disease in the RCA as well as 60% in the second diagonal branch -echocardiogram revealed LVEF 60-65%, no RWMA, nd mild MR -LHC done  02/13/22 with progression of multivessel disease -awaiting transfer to [facility] for evaluation by CTVS -continued on asa, coreg,atorvastatin, and imdur -imdur increased to 60 mg bid for continued break through chest pain -coreg increased to 25 mg bid for 1  four beat run of NSVT noted on telemetry overnight -brilinta remains on hold pending surgery(washout of 5 days) -continue with cardiac monitoring  -BMP ordered for today  Essential hypertension -blood pressure prior to medications 136/103 -  continued on losartan, coreg, and imdur -vital signs per unit protocol  3.    Polysubstance abuse of cocaine and tobacco -urine drug screen positive for cocaine on the last three screens -continues to smoke 3-5 cigarettes a day -total cessation recommended  4.    Hyperlipidemia -LDL 93 not at goal -continued on atorvastatin 80 mg daily  5.    Medication nonadherence -patient reported he lost or ran out of several of his medication -pharmacy stated last brilinta was filled in September -continue to discuss the importance of taking medication as prescribed     For questions or updates, please contact [facility] Please consult [email or web address] for contact info under        Signed, Timonthy Hovater, NP  02/15/2022, 9:13 AM

## 2022-02-15 NOTE — Progress Notes (Signed)
ANTICOAGULATION CONSULT NOTE   Pharmacy Consult for heparin Indication: chest pain/ACS  No Known Allergies  Patient Measurements: Height: 6' (182.9 cm) Weight: 95.3 kg (210 lb) IBW/kg (Calculated) : 77.6 Heparin Dosing Weight: 95.3  Vital Signs: Temp: 98 F (36.7 C) (12/23 0403) Temp Source: Oral (12/23 0403) BP: 125/68 (12/23 0403) Pulse Rate: 69 (12/23 0403)  Labs: Recent Labs    02/12/22 0855 02/12/22 1645 02/13/22 0515 02/13/22 1915 02/13/22 1916 02/14/22 0228 02/14/22 0956 02/14/22 1550 02/15/22 0646  HGB  --    < > 14.2  --   --  14.1  --   --  14.7  HCT  --   --  41.9  --   --  40.8  --   --  43.3  PLT  --   --  296  --   --  286  --   --  271  HEPARINUNFRC 0.38   < > 0.37  --   --  0.25* 0.35 0.37 0.42  CREATININE  --   --   --   --  1.33* 1.59*  --   --   --   TROPONINIHS 839*  --   --  341*  --   --   --   --   --    < > = values in this interval not displayed.     Estimated Creatinine Clearance: 68.1 mL/min (A) (by C-G formula based on SCr of 1.59 mg/dL (H)).   Medical History: Past Medical History:  Diagnosis Date   CAD (coronary artery disease)    a. 11/2019 NSTEMI/PCI: LM min irregs, LAD 63m (2.75x22 Resolute Onyx DES), 40d, D2 min irregs, LCX/LPAV min irregs, RCA 60ost, 40p/m, EF 45-50%; b. 11/2020 MV: EF 43%, small area of apical ischemia ->? over processing-->low risk.   Hyperlipidemia    Hypertension    Ischemic cardiomyopathy    a. 11/2019 LV Gram: EF 45-50%; b. 12/2019 Echo: EF 50% w/ mod mid-apical anteroseptal HK, GrI DD, nl RV fxn, mild Ao sclerosis.   Tobacco use 11/25/2019    Medications: No chronic anticoag PTA Aspirin 81 mg, Ticagrelor 90 mg  Assessment: 46 yo M with PMH HTN, HLD, CAD, NSTEMI (12/2019), unstable angina presents with chest pain. Troponins elevated at 256 and ECG shows possible STEMI, infarcts of indeterminate age, and sinus tachycardia. No chronic anticoag PTA noted from chart review. Hgb, Hct, PLT stable. S/p  cath with progression of severe multivessel disease.    Date Time aPTT/HL Rate/Comment 12/20 0149 HL 0.19 Subtherapeutic  12/20 0855 HL 0.38 Therapeutic x 1 @ 1500 units/hr  12/20 1645 HL 0.41 Therapeutic x 2    12/21   0515   HL 0.37           Therapeutic X 3  12/22   0228    HL 0.25          SUBtherapeutic  12/22 0956 HL 0.35  Therapeutic 12/22 1550 HL 0.37 Therapeutic 12/23 0646 HL 0.42 Therapeutic   Goal of Therapy:  Heparin level 0.3-0.7 units/ml Monitor platelets by anticoagulation protocol: Yes   Plan:  Continue current heparin infusion rate, will order next heparin level with AM labs Will continue to follow CBC daily while on heparin therapy  Bettey Costa, PharmD Clinical Pharmacist 02/15/2022 7:29 AM

## 2022-02-16 DIAGNOSIS — N179 Acute kidney failure, unspecified: Secondary | ICD-10-CM

## 2022-02-16 LAB — BASIC METABOLIC PANEL
Anion gap: 6 (ref 5–15)
BUN: 12 mg/dL (ref 6–20)
CO2: 22 mmol/L (ref 22–32)
Calcium: 8.9 mg/dL (ref 8.9–10.3)
Chloride: 110 mmol/L (ref 98–111)
Creatinine, Ser: 1.29 mg/dL — ABNORMAL HIGH (ref 0.61–1.24)
GFR, Estimated: >60 mL/min (ref >60)
Glucose, Bld: 83 mg/dL (ref 70–99)
Potassium: 4.6 mmol/L (ref 3.5–5.1)
Sodium: 138 mmol/L (ref 135–145)

## 2022-02-16 LAB — HEPARIN LEVEL (UNFRACTIONATED): Heparin Unfractionated: 0.47 IU/mL (ref 0.30–0.70)

## 2022-02-16 MED ORDER — HYDROCORTISONE 1 % EX CREA
TOPICAL_CREAM | CUTANEOUS | Status: DC | PRN
  Filled 2022-02-16: qty 28

## 2022-02-16 NOTE — Progress Notes (Signed)
ANTICOAGULATION CONSULT NOTE   Pharmacy Consult for heparin Indication: chest pain/ACS  No Known Allergies  Patient Measurements: Height: 6' (182.9 cm) Weight: 95.3 kg (210 lb) IBW/kg (Calculated) : 77.6 Heparin Dosing Weight: 95.3  Vital Signs: Temp: 98.1 F (36.7 C) (12/24 0335) BP: 124/79 (12/24 0335) Pulse Rate: 70 (12/24 0335)  Labs: Recent Labs    02/13/22 1915 02/13/22 1916 02/14/22 0228 02/14/22 0956 02/14/22 1550 02/15/22 0639 02/15/22 0646 02/16/22 0600  HGB  --   --  14.1  --   --   --  14.7  --   HCT  --   --  40.8  --   --   --  43.3  --   PLT  --   --  286  --   --   --  271  --   HEPARINUNFRC  --   --  0.25*   < > 0.37  --  0.42 0.47  CREATININE  --  1.33* 1.59*  --   --  1.44*  --   --   TROPONINIHS 341*  --   --   --   --   --   --   --    < > = values in this interval not displayed.     Estimated Creatinine Clearance: 75.2 mL/min (A) (by C-G formula based on SCr of 1.44 mg/dL (H)).   Medical History: Past Medical History:  Diagnosis Date   CAD (coronary artery disease)    a. 11/2019 NSTEMI/PCI: LM min irregs, LAD 75m (2.75x22 Resolute Onyx DES), 40d, D2 min irregs, LCX/LPAV min irregs, RCA 60ost, 40p/m, EF 45-50%; b. 11/2020 MV: EF 43%, small area of apical ischemia ->? over processing-->low risk.   Hyperlipidemia    Hypertension    Ischemic cardiomyopathy    a. 11/2019 LV Gram: EF 45-50%; b. 12/2019 Echo: EF 50% w/ mod mid-apical anteroseptal HK, GrI DD, nl RV fxn, mild Ao sclerosis.   Tobacco use 11/25/2019    Medications: No chronic anticoag PTA Aspirin 81 mg, Ticagrelor 90 mg  Assessment: 46 yo M with PMH HTN, HLD, CAD, NSTEMI (12/2019), unstable angina presents with chest pain. Troponins elevated at 256 and ECG shows possible STEMI, infarcts of indeterminate age, and sinus tachycardia. No chronic anticoag PTA noted from chart review. Hgb, Hct, PLT stable. S/p cath with progression of severe multivessel disease.     Date Time aPTT/HL Rate/Comment 12/20 0149 HL 0.19 Subtherapeutic  12/20 0855 HL 0.38 Therapeutic x 1 @ 1500 units/hr  12/20 1645 HL 0.41 Therapeutic x 2    12/21   0515   HL 0.37           Therapeutic X 3  12/22   0228    HL 0.25          SUBtherapeutic  12/22 0956 HL 0.35  Therapeutic 12/22 1550 HL 0.37 Therapeutic 12/23 0646 HL 0.42 Therapeutic  12/24   0600   HL 0.47            Therapeutic X 4   Goal of Therapy:  Heparin level 0.3-0.7 units/ml Monitor platelets by anticoagulation protocol: Yes   Plan:  12/24:  HL @ 0600 = 0.47, therapeutic X 4 Will continue pt on current rate and recheck HL on 12/25 with AM labs.   Scherrie Gerlach, PharmD Clinical Pharmacist 02/16/2022 6:39 AM

## 2022-02-16 NOTE — Progress Notes (Signed)
PROGRESS NOTE    Logan Crawford  MLJ:[id number] DOB: 12-12-1975 DOA: 02/11/2022 PCP: Pcp, No    Brief Narrative:  This 46 y.o. male with medical history significant for HTN, polysubstance abuse, tobacco use disorder, CAD s/p PCI in 04/2021, G1 DD, EF 60-65 on  10/2021, hospitalized from 11/16/21 to 11/19/21 with NSTEMI for which she underwent repeat Left cardiac cath on 9/25 showing significant two-vessel coronary artery disease which was  treated medically and on DAPT, He presents to the ED with chest pain typical to prior NSTEMI. EMS administered aspirin and sublingual nitro which relieved his pain but after 20 minutes the pain recurred. Troponin trending up 29> 256> 429>835.  EKG shows ST depression V1 to V3 and aVF. Chest x-ray no acute disease.  Patient is started on IV heparin and admitted for further evaluation.  Patient was evaluated by cardiology, recommended left heart cath. Patient underwent left heart cath 02/13/22 which showed recurrent in-stent restenosis in the LAD in the setting of continued cocaine use and poor compliance with antiplatelet medications.  Patient also has significant progression of the RCA disease.  Cardiologist recommended transfer to [facility] for CABG.  Assessment & Plan:   Principal Problem:   NSTEMI (non-ST elevated myocardial infarction) (HCC) Active Problems:   Polysubstance abuse (HCC)   Hypertension   Hyperlipidemia   Tobacco use   Chronic diastolic CHF (congestive heart failure) (HCC)   Cocaine abuse (HCC)  NSTEMI: CAD s/p PCI: Patient presented with typical chest pain like prior NSTEMI. LHC on 11/18/2021 showed significant two-vessel coronary artery disease. He was advised medical management only. Troponin trending up 29> 256> 429>835 Continue IV heparin.  Continue nitroglycerin infusion. Continue aspirin, Brilinta, Lipitor, carvedilol. Cardiology is consulted, Patient underwent left heart cath 12/21 which showed recurrent instent restenosis  in the LAD in the setting of continued cocaine use and poor compliance with antiplatelet medications.  Patient also has significant progression of the RCA disease.   Cardiologist recommended transfer to [facility] for CABG.   Polysubstance abuse: Urine drug screen positive for cocaine. Counseled on quitting substance abuse.   Essential hypertension Continue losartan ,imdur and carvedilol.   Hyperlipidemia: Continue Lipitor   Tobacco use: Counseled on quit smoking.   Chronic diastolic CHF: Appears euvolemic. Last echo showed LVEF 60 to 65%. Continue Coreg and losartan.   DVT prophylaxis: Heparin iv Code Status: Full code Family Communication: No family at bed side. Disposition Plan:    Status is: Inpatient Remains inpatient appropriate because:   Awaiting transfer to [facility] for CABG, given severe diffuse coronary artery disease    Consultants:  Cardiology  Procedures: LHC Antimicrobials: None  Subjective: Patient was seen and examined at bedside.  Overnight events noted.   Patient reports had chest pain last night which got better with nitro sublingual. Denies any chest pain at this time,  awaiting transfer to [facility] for CABG.  Objective: Vitals:   02/15/22 2353 02/16/22 0203 02/16/22 0335 02/16/22 0801  BP: 133/73 102/68 124/79 (!) 148/95  Pulse: 65 68 70 69  Resp: 18  18 17   Temp: 97.9 F (36.6 C)  98.1 F (36.7 C) 98.4 F (36.9 C)  TempSrc:    Oral  SpO2: 97% 98% 98% 99%  Weight:      Height:        Intake/Output Summary (Last 24 hours) at 02/16/2022 1122 Last data filed at 02/15/2022 2300 Gross per 24 hour  Intake 488.06 ml  Output --  Net 488.06 ml  Filed Weights   02/11/22 1749 02/13/22 1315  Weight: 95.3 kg 95.3 kg    Examination:  General exam: Appears comfortable, deconditioned, not in any distress. Respiratory system: CTA bilaterally, respiratory effort normal, RR 15. Cardiovascular system: S1 & S2 heard, regular rate and  rhythm, no murmur. Gastrointestinal system: Abdomen is soft, non tender, non distended, BS+ Central nervous system: Alert and oriented x 3. No focal neurological deficits. Extremities: No edema, no cyanosis, no clubbing. Skin: No rashes, lesions or ulcers Psychiatry: Judgement and insight appear normal. Mood & affect appropriate.     Data Reviewed: I have personally reviewed following labs and imaging studies  CBC: Recent Labs  Lab 02/11/22 1800 02/12/22 0540 02/13/22 0515 02/14/22 0228 02/15/22 0646  WBC 9.2 8.0 7.7 6.9 7.5  HGB 15.1 14.5 14.2 14.1 14.7  HCT 44.0 42.5 41.9 40.8 43.3  MCV 88.0 88.7 89.3 87.9 89.5  PLT 325 295 296 286 271   Basic Metabolic Panel: Recent Labs  Lab 02/11/22 1800 02/13/22 1916 02/14/22 0228 02/15/22 0639 02/16/22 0559  NA 139 136 138 136 138  K 3.8 4.0 4.1 4.9 4.6  CL 107 107 109 108 110  CO2 24 22 23  18* 22  GLUCOSE 143* 141* 100* 70 83  BUN 13 14 15 13 12   CREATININE 1.34* 1.33* 1.59* 1.44* 1.29*  CALCIUM 9.2 8.5* 8.3* 8.9 8.9  MG  --   --  2.1  --   --   PHOS  --   --  4.5  --   --    GFR: Estimated Creatinine Clearance: 83.9 mL/min (A) (by C-G formula based on SCr of 1.29 mg/dL (H)). Liver Function Tests: No results for input(s): "AST", "ALT", "ALKPHOS", "BILITOT", "PROT", "ALBUMIN" in the last 168 hours. No results for input(s): "LIPASE", "AMYLASE" in the last 168 hours. No results for input(s): "AMMONIA" in the last 168 hours. Coagulation Profile: Recent Labs  Lab 02/11/22 1800  INR 1.1   Cardiac Enzymes: No results for input(s): "CKTOTAL", "CKMB", "CKMBINDEX", "TROPONINI" in the last 168 hours. BNP (last 3 results) No results for input(s): "PROBNP" in the last 8760 hours. HbA1C: No results for input(s): "HGBA1C" in the last 72 hours. CBG: No results for input(s): "GLUCAP" in the last 168 hours. Lipid Profile: No results for input(s): "CHOL", "HDL", "LDLCALC", "TRIG", "CHOLHDL", "LDLDIRECT" in the last 72  hours. Thyroid Function Tests: No results for input(s): "TSH", "T4TOTAL", "FREET4", "T3FREE", "THYROIDAB" in the last 72 hours. Anemia Panel: No results for input(s): "VITAMINB12", "FOLATE", "FERRITIN", "TIBC", "IRON", "RETICCTPCT" in the last 72 hours. Sepsis Labs: No results for input(s): "PROCALCITON", "LATICACIDVEN" in the last 168 hours.  Recent Results (from the past 240 hour(s))  Resp panel by RT-PCR (RSV, Flu A&B, Covid) Anterior Nasal Swab     Status: None   Collection Time: 02/11/22  8:34 PM   Specimen: Anterior Nasal Swab  Result Value Ref Range Status   SARS Coronavirus 2 by RT PCR NEGATIVE NEGATIVE Final    Comment: (NOTE) SARS-CoV-2 target nucleic acids are NOT DETECTED.  The SARS-CoV-2 RNA is generally detectable in upper respiratory specimens during the acute phase of infection. The lowest concentration of SARS-CoV-2 viral copies this assay can detect is 138 copies/mL. A negative result does not preclude SARS-Cov-2 infection and should not be used as the sole basis for treatment or other patient management decisions. A negative result may occur with  improper specimen collection/handling, submission of specimen other than nasopharyngeal swab, presence of viral mutation(s) within the  areas targeted by this assay, and inadequate number of viral copies(<138 copies/mL). A negative result must be combined with clinical observations, patient history, and epidemiological information. The expected result is Negative.  Fact Sheet for Patients:  BloggerCourse.com  Fact Sheet for Healthcare Providers:  SeriousBroker.it  This test is no t yet approved or cleared by the [place] FDA and  has been authorized for detection and/or diagnosis of SARS-CoV-2 by FDA under an Emergency Use Authorization (EUA). This EUA will remain  in effect (meaning this test can be used) for the duration of the COVID-19 declaration under  Section 564(b)(1) of the Act, 21 U.S.C.section 360bbb-3(b)(1), unless the authorization is terminated  or revoked sooner.       Influenza A by PCR NEGATIVE NEGATIVE Final   Influenza B by PCR NEGATIVE NEGATIVE Final    Comment: (NOTE) The Xpert Xpress SARS-CoV-2/FLU/RSV plus assay is intended as an aid in the diagnosis of influenza from Nasopharyngeal swab specimens and should not be used as a sole basis for treatment. Nasal washings and aspirates are unacceptable for Xpert Xpress SARS-CoV-2/FLU/RSV testing.  Fact Sheet for Patients: BloggerCourse.com  Fact Sheet for Healthcare Providers: SeriousBroker.it  This test is not yet approved or cleared by the [place] FDA and has been authorized for detection and/or diagnosis of SARS-CoV-2 by FDA under an Emergency Use Authorization (EUA). This EUA will remain in effect (meaning this test can be used) for the duration of the COVID-19 declaration under Section 564(b)(1) of the Act, 21 U.S.C. section 360bbb-3(b)(1), unless the authorization is terminated or revoked.     Resp Syncytial Virus by PCR NEGATIVE NEGATIVE Final    Comment: (NOTE) Fact Sheet for Patients: BloggerCourse.com  Fact Sheet for Healthcare Providers: SeriousBroker.it  This test is not yet approved or cleared by the [place] FDA and has been authorized for detection and/or diagnosis of SARS-CoV-2 by FDA under an Emergency Use Authorization (EUA). This EUA will remain in effect (meaning this test can be used) for the duration of the COVID-19 declaration under Section 564(b)(1) of the Act, 21 U.S.C. section 360bbb-3(b)(1), unless the authorization is terminated or revoked.  Performed at [facility], [address] [place], [place] [address]    Radiology Studies: No results found.  Scheduled Meds:  aspirin EC  81 mg Oral Daily    atorvastatin  80 mg Oral Daily   carvedilol  25 mg Oral BID WC   isosorbide mononitrate  60 mg Oral BID   pantoprazole  40 mg Oral Daily   sodium chloride flush  3 mL Intravenous Q12H   sodium chloride flush  3 mL Intravenous Q12H   Continuous Infusions:  sodium chloride     sodium chloride     sodium chloride 1 mL/kg/hr (02/13/22 1323)   heparin 1,700 Units/hr (02/16/22 0923)   nitroGLYCERIN       LOS: 5 days    Time spent: 35 mins    Logan Lage, MD [facility]   If 7PM-7AM, please contact night-coverage

## 2022-02-16 NOTE — Progress Notes (Signed)
Rounding Note    Patient Name: Logan Crawford Date of Encounter: 02/16/2022  [facility] Cardiologist: Debbe Odea, MD   Subjective   No acute events overnight, occasional chest discomfort.  Awaiting transfer to [facility] for CABG evaluation.  Inpatient Medications    Scheduled Meds:  aspirin EC  81 mg Oral Daily   atorvastatin  80 mg Oral Daily   carvedilol  25 mg Oral BID WC   isosorbide mononitrate  60 mg Oral BID   pantoprazole  40 mg Oral Daily   sodium chloride flush  3 mL Intravenous Q12H   sodium chloride flush  3 mL Intravenous Q12H   Continuous Infusions:  sodium chloride     sodium chloride     sodium chloride 1 mL/kg/hr (02/13/22 1323)   heparin 1,700 Units/hr (02/16/22 0923)   nitroGLYCERIN     PRN Meds: sodium chloride, sodium chloride, acetaminophen **OR** acetaminophen, albuterol, hydrALAZINE, morphine injection, nitroGLYCERIN, ondansetron **OR** ondansetron (ZOFRAN) IV, mouth rinse, sodium chloride flush, sodium chloride flush   Vital Signs    Vitals:   02/15/22 2353 02/16/22 0203 02/16/22 0335 02/16/22 0801  BP: 133/73 102/68 124/79 (!) 148/95  Pulse: 65 68 70 69  Resp: 18  18 17   Temp: 97.9 F (36.6 C)  98.1 F (36.7 C) 98.4 F (36.9 C)  TempSrc:    Oral  SpO2: 97% 98% 98% 99%  Weight:      Height:        Intake/Output Summary (Last 24 hours) at 02/16/2022 1136 Last data filed at 02/15/2022 2300 Gross per 24 hour  Intake 488.06 ml  Output --  Net 488.06 ml      02/13/2022    1:15 PM 02/11/2022    5:49 PM 11/21/2021   10:24 AM  Last 3 Weights  Weight (lbs) 210 lb 210 lb 225 lb 12.8 oz  Weight (kg) 95.255 kg 95.255 kg 102.422 kg      Telemetry    Sinus rhythm, heart rate 88- Personally Reviewed  ECG     - Personally Reviewed  Physical Exam   GEN: No acute distress.   Neck: No JVD Cardiac: RRR, no murmurs, rubs, or gallops.  Respiratory: Clear to auscultation bilaterally. GI: Soft, nontender,  non-distended  MS: No edema; No deformity. Neuro:  Nonfocal  Psych: Normal affect   Labs    High Sensitivity Troponin:   Recent Labs  Lab 02/11/22 1800 02/11/22 2018 02/12/22 0855 02/13/22 1915  TROPONINIHS 256* 429* 839* 341*     Chemistry Recent Labs  Lab 02/14/22 0228 02/15/22 0639 02/16/22 0559  NA 138 136 138  K 4.1 4.9 4.6  CL 109 108 110  CO2 23 18* 22  GLUCOSE 100* 70 83  BUN 15 13 12   CREATININE 1.59* 1.44* 1.29*  CALCIUM 8.3* 8.9 8.9  MG 2.1  --   --   GFRNONAA 53* 60* >60  ANIONGAP 6 10 6     Lipids No results for input(s): "CHOL", "TRIG", "HDL", "LABVLDL", "LDLCALC", "CHOLHDL" in the last 168 hours.  Hematology Recent Labs  Lab 02/13/22 0515 02/14/22 0228 02/15/22 0646  WBC 7.7 6.9 7.5  RBC 4.69 4.64 4.84  HGB 14.2 14.1 14.7  HCT 41.9 40.8 43.3  MCV 89.3 87.9 89.5  MCH 30.3 30.4 30.4  MCHC 33.9 34.6 33.9  RDW 13.9 14.0 14.2  PLT 296 286 271   Thyroid No results for input(s): "TSH", "FREET4" in the last 168 hours.  BNPNo results for input(s): "  BNP", "PROBNP" in the last 168 hours.  DDimer No results for input(s): "DDIMER" in the last 168 hours.   Radiology    No results found.  Cardiac Studies   Limited TTE 02/12/2022   1. Left ventricular ejection fraction, by estimation, is 60 to 65%. The  left ventricle has normal function. The left ventricle has no regional  wall motion abnormalities.   2. Right ventricular systolic function is normal. The right ventricular  size is normal.   3. The mitral valve is normal in structure. Mild mitral valve  regurgitation.   Patient Profile     46 y.o. male with history of CAD/PCI x2( mid LAD 2021, 04/2021), hypertension, current smoker, cocaine use presenting with chest pain diagnosed with NSTEMI.   Assessment & Plan    1.  CAD/NSTEMI prior PCI -Left heart cath this admission showed significant LAD and RCA disease -Awaiting transfer to [facility] for CABG eval. -Continue Coreg to 25 mg twice  daily, Imdur to 60 mg twice daily -Continue aspirin, Lipitor 80, heparin drip -Hold Brilinta in anticipation for CABG -Echo with preserved EF   2.  Hypertension -Coreg, Imdur.  Will    3.  Polysubstance abuse -Complete cessation recommended  4.  Mild AKI -Creatinine improving with holding losartan. -Continue to avoid nephrotoxic's   Greater than 50% was spent in counseling and coordination of care with patient Total encounter time 50 minutes        Signed, Debbe Odea, MD  02/16/2022, 11:36 AM

## 2022-02-17 ENCOUNTER — Other Ambulatory Visit: Payer: Self-pay

## 2022-02-17 ENCOUNTER — Inpatient Hospital Stay (HOSPITAL_COMMUNITY)
Admit: 2022-02-17 | Discharge: 2022-03-01 | DRG: 235 | Disposition: A | Discharge status: Home / Self Care | Payer: Medicaid Other | Attending: Cardiothoracic Surgery | Admitting: Cardiothoracic Surgery

## 2022-02-17 ENCOUNTER — Encounter (HOSPITAL_COMMUNITY): Payer: Self-pay | Admitting: Cardiology

## 2022-02-17 DIAGNOSIS — E785 Hyperlipidemia, unspecified: Secondary | ICD-10-CM | POA: Diagnosis present

## 2022-02-17 DIAGNOSIS — Z716 Tobacco abuse counseling: Secondary | ICD-10-CM

## 2022-02-17 DIAGNOSIS — I97710 Intraoperative cardiac arrest during cardiac surgery: Secondary | ICD-10-CM | POA: Diagnosis not present

## 2022-02-17 DIAGNOSIS — T82855A Stenosis of coronary artery stent, initial encounter: Secondary | ICD-10-CM | POA: Diagnosis not present

## 2022-02-17 DIAGNOSIS — Y838 Other surgical procedures as the cause of abnormal reaction of the patient, or of later complication, without mention of misadventure at the time of the procedure: Secondary | ICD-10-CM | POA: Diagnosis not present

## 2022-02-17 DIAGNOSIS — E669 Obesity, unspecified: Secondary | ICD-10-CM | POA: Diagnosis not present

## 2022-02-17 DIAGNOSIS — F17210 Nicotine dependence, cigarettes, uncomplicated: Secondary | ICD-10-CM | POA: Diagnosis present

## 2022-02-17 DIAGNOSIS — R008 Other abnormalities of heart beat: Secondary | ICD-10-CM | POA: Diagnosis not present

## 2022-02-17 DIAGNOSIS — J9601 Acute respiratory failure with hypoxia: Secondary | ICD-10-CM | POA: Diagnosis not present

## 2022-02-17 DIAGNOSIS — N1831 Chronic kidney disease, stage 3a: Secondary | ICD-10-CM | POA: Diagnosis not present

## 2022-02-17 DIAGNOSIS — I2510 Atherosclerotic heart disease of native coronary artery without angina pectoris: Secondary | ICD-10-CM | POA: Diagnosis not present

## 2022-02-17 DIAGNOSIS — N189 Chronic kidney disease, unspecified: Secondary | ICD-10-CM | POA: Diagnosis not present

## 2022-02-17 DIAGNOSIS — Z79899 Other long term (current) drug therapy: Secondary | ICD-10-CM

## 2022-02-17 DIAGNOSIS — I252 Old myocardial infarction: Secondary | ICD-10-CM

## 2022-02-17 DIAGNOSIS — Z7984 Long term (current) use of oral hypoglycemic drugs: Secondary | ICD-10-CM

## 2022-02-17 DIAGNOSIS — N179 Acute kidney failure, unspecified: Secondary | ICD-10-CM | POA: Diagnosis not present

## 2022-02-17 DIAGNOSIS — I50810 Right heart failure, unspecified: Secondary | ICD-10-CM | POA: Diagnosis not present

## 2022-02-17 DIAGNOSIS — Y831 Surgical operation with implant of artificial internal device as the cause of abnormal reaction of the patient, or of later complication, without mention of misadventure at the time of the procedure: Secondary | ICD-10-CM | POA: Diagnosis not present

## 2022-02-17 DIAGNOSIS — Z951 Presence of aortocoronary bypass graft: Secondary | ICD-10-CM | POA: Diagnosis not present

## 2022-02-17 DIAGNOSIS — Z23 Encounter for immunization: Secondary | ICD-10-CM | POA: Diagnosis not present

## 2022-02-17 DIAGNOSIS — Z8616 Personal history of COVID-19: Secondary | ICD-10-CM

## 2022-02-17 DIAGNOSIS — I214 Non-ST elevation (NSTEMI) myocardial infarction: Secondary | ICD-10-CM | POA: Diagnosis not present

## 2022-02-17 DIAGNOSIS — T8111XA Postprocedural  cardiogenic shock, initial encounter: Secondary | ICD-10-CM | POA: Diagnosis not present

## 2022-02-17 DIAGNOSIS — D62 Acute posthemorrhagic anemia: Secondary | ICD-10-CM | POA: Diagnosis not present

## 2022-02-17 DIAGNOSIS — I9789 Other postprocedural complications and disorders of the circulatory system, not elsewhere classified: Secondary | ICD-10-CM | POA: Diagnosis not present

## 2022-02-17 DIAGNOSIS — E875 Hyperkalemia: Secondary | ICD-10-CM | POA: Diagnosis not present

## 2022-02-17 DIAGNOSIS — Z91199 Patient's noncompliance with other medical treatment and regimen due to unspecified reason: Secondary | ICD-10-CM

## 2022-02-17 DIAGNOSIS — I25119 Atherosclerotic heart disease of native coronary artery with unspecified angina pectoris: Secondary | ICD-10-CM | POA: Diagnosis not present

## 2022-02-17 DIAGNOSIS — R051 Acute cough: Secondary | ICD-10-CM | POA: Diagnosis not present

## 2022-02-17 DIAGNOSIS — E11649 Type 2 diabetes mellitus with hypoglycemia without coma: Secondary | ICD-10-CM | POA: Diagnosis not present

## 2022-02-17 DIAGNOSIS — I255 Ischemic cardiomyopathy: Secondary | ICD-10-CM | POA: Diagnosis present

## 2022-02-17 DIAGNOSIS — R001 Bradycardia, unspecified: Secondary | ICD-10-CM | POA: Diagnosis not present

## 2022-02-17 DIAGNOSIS — Z1152 Encounter for screening for COVID-19: Secondary | ICD-10-CM | POA: Diagnosis not present

## 2022-02-17 DIAGNOSIS — E1122 Type 2 diabetes mellitus with diabetic chronic kidney disease: Secondary | ICD-10-CM | POA: Diagnosis not present

## 2022-02-17 DIAGNOSIS — F1410 Cocaine abuse, uncomplicated: Secondary | ICD-10-CM | POA: Diagnosis not present

## 2022-02-17 DIAGNOSIS — D72829 Elevated white blood cell count, unspecified: Secondary | ICD-10-CM | POA: Diagnosis not present

## 2022-02-17 DIAGNOSIS — Z955 Presence of coronary angioplasty implant and graft: Secondary | ICD-10-CM

## 2022-02-17 DIAGNOSIS — I5032 Chronic diastolic (congestive) heart failure: Secondary | ICD-10-CM

## 2022-02-17 DIAGNOSIS — I442 Atrioventricular block, complete: Secondary | ICD-10-CM | POA: Diagnosis not present

## 2022-02-17 DIAGNOSIS — Z87891 Personal history of nicotine dependence: Secondary | ICD-10-CM | POA: Diagnosis not present

## 2022-02-17 DIAGNOSIS — Z01810 Encounter for preprocedural cardiovascular examination: Secondary | ICD-10-CM | POA: Diagnosis not present

## 2022-02-17 DIAGNOSIS — E876 Hypokalemia: Secondary | ICD-10-CM | POA: Diagnosis not present

## 2022-02-17 DIAGNOSIS — R570 Cardiogenic shock: Secondary | ICD-10-CM | POA: Diagnosis not present

## 2022-02-17 DIAGNOSIS — I130 Hypertensive heart and chronic kidney disease with heart failure and stage 1 through stage 4 chronic kidney disease, or unspecified chronic kidney disease: Secondary | ICD-10-CM | POA: Diagnosis present

## 2022-02-17 DIAGNOSIS — I25110 Atherosclerotic heart disease of native coronary artery with unstable angina pectoris: Secondary | ICD-10-CM | POA: Diagnosis present

## 2022-02-17 DIAGNOSIS — Z7902 Long term (current) use of antithrombotics/antiplatelets: Secondary | ICD-10-CM

## 2022-02-17 DIAGNOSIS — T8189XA Other complications of procedures, not elsewhere classified, initial encounter: Secondary | ICD-10-CM | POA: Diagnosis not present

## 2022-02-17 DIAGNOSIS — Z7982 Long term (current) use of aspirin: Secondary | ICD-10-CM

## 2022-02-17 DIAGNOSIS — K219 Gastro-esophageal reflux disease without esophagitis: Secondary | ICD-10-CM | POA: Diagnosis present

## 2022-02-17 DIAGNOSIS — I080 Rheumatic disorders of both mitral and aortic valves: Secondary | ICD-10-CM | POA: Diagnosis not present

## 2022-02-17 DIAGNOSIS — J9811 Atelectasis: Secondary | ICD-10-CM | POA: Diagnosis present

## 2022-02-17 DIAGNOSIS — D75839 Thrombocytosis, unspecified: Secondary | ICD-10-CM | POA: Diagnosis not present

## 2022-02-17 DIAGNOSIS — Z8249 Family history of ischemic heart disease and other diseases of the circulatory system: Secondary | ICD-10-CM

## 2022-02-17 DIAGNOSIS — Z6832 Body mass index (BMI) 32.0-32.9, adult: Secondary | ICD-10-CM

## 2022-02-17 DIAGNOSIS — Z91148 Patient's other noncompliance with medication regimen for other reason: Secondary | ICD-10-CM

## 2022-02-17 DIAGNOSIS — I110 Hypertensive heart disease with heart failure: Secondary | ICD-10-CM | POA: Diagnosis not present

## 2022-02-17 DIAGNOSIS — I509 Heart failure, unspecified: Secondary | ICD-10-CM | POA: Diagnosis not present

## 2022-02-17 LAB — BASIC METABOLIC PANEL
Anion gap: 9 (ref 5–15)
BUN: 16 mg/dL (ref 6–20)
CO2: 23 mmol/L (ref 22–32)
Calcium: 9.1 mg/dL (ref 8.9–10.3)
Chloride: 106 mmol/L (ref 98–111)
Creatinine, Ser: 1.47 mg/dL — ABNORMAL HIGH (ref 0.61–1.24)
GFR, Estimated: 58 mL/min — ABNORMAL LOW (ref >60)
Glucose, Bld: 95 mg/dL (ref 70–99)
Potassium: 4.6 mmol/L (ref 3.5–5.1)
Sodium: 138 mmol/L (ref 135–145)

## 2022-02-17 LAB — HEPARIN LEVEL (UNFRACTIONATED)
Heparin Unfractionated: 0.27 IU/mL — ABNORMAL LOW (ref 0.30–0.70)
Heparin Unfractionated: 0.44 IU/mL (ref 0.30–0.70)

## 2022-02-17 MED ORDER — ONDANSETRON HCL 4 MG/2ML IJ SOLN
4.0000 mg | Freq: Four times a day (QID) | INTRAMUSCULAR | Status: DC | PRN
  Administered 2022-02-18: 4 mg via INTRAVENOUS
  Filled 2022-02-17: qty 2

## 2022-02-17 MED ORDER — ASPIRIN 81 MG PO TBEC
81.0000 mg | DELAYED_RELEASE_TABLET | Freq: Every day | ORAL | Status: DC
  Administered 2022-02-18: 81 mg via ORAL
  Filled 2022-02-17: qty 1

## 2022-02-17 MED ORDER — ORAL CARE MOUTH RINSE: 15.0000 mL | OROMUCOSAL | Status: DC | PRN

## 2022-02-17 MED ORDER — ISOSORBIDE MONONITRATE ER 60 MG PO TB24
60.0000 mg | ORAL_TABLET | Freq: Two times a day (BID) | ORAL | Status: DC
  Administered 2022-02-17 – 2022-02-18 (×3): 60 mg via ORAL
  Filled 2022-02-17 (×3): qty 1

## 2022-02-17 MED ORDER — ACETAMINOPHEN 325 MG PO TABS
650.0000 mg | ORAL_TABLET | ORAL | Status: DC | PRN
  Administered 2022-02-17 – 2022-02-18 (×2): 650 mg via ORAL
  Filled 2022-02-17 (×2): qty 2

## 2022-02-17 MED ORDER — NITROGLYCERIN 0.4 MG SL SUBL
0.4000 mg | SUBLINGUAL_TABLET | SUBLINGUAL | Status: DC | PRN
  Administered 2022-02-18 (×2): 0.4 mg via SUBLINGUAL
  Filled 2022-02-17: qty 1

## 2022-02-17 MED ORDER — HEPARIN BOLUS VIA INFUSION
1500.0000 [IU] | Freq: Once | INTRAVENOUS | Status: AC
  Administered 2022-02-17: 1500 [IU] via INTRAVENOUS
  Filled 2022-02-17: qty 1500

## 2022-02-17 MED ORDER — CARVEDILOL 25 MG PO TABS
25.0000 mg | ORAL_TABLET | Freq: Two times a day (BID) | ORAL | Status: DC
  Administered 2022-02-17 – 2022-02-18 (×3): 25 mg via ORAL
  Filled 2022-02-17 (×3): qty 1

## 2022-02-17 MED ORDER — PANTOPRAZOLE SODIUM 40 MG PO TBEC
40.0000 mg | DELAYED_RELEASE_TABLET | Freq: Every day | ORAL | Status: DC
  Administered 2022-02-18: 40 mg via ORAL
  Filled 2022-02-17: qty 1

## 2022-02-17 MED ORDER — HEPARIN (PORCINE) 25000 UT/250ML-% IV SOLN
1800.0000 [IU]/h | INTRAVENOUS | Status: DC
  Administered 2022-02-17 – 2022-02-18 (×3): 1800 [IU]/h via INTRAVENOUS
  Filled 2022-02-17 (×3): qty 250

## 2022-02-17 MED ORDER — ISOSORBIDE MONONITRATE ER 60 MG PO TB24: 60.0000 mg | ORAL_TABLET | Freq: Two times a day (BID) | ORAL | 1 refills | Status: DC

## 2022-02-17 MED ORDER — CARVEDILOL 25 MG PO TABS: 25.0000 mg | ORAL_TABLET | Freq: Two times a day (BID) | ORAL | 1 refills | Status: DC

## 2022-02-17 NOTE — Progress Notes (Signed)
   02/17/22 1152  Medical Necessity for Transport Certificate --- IF THIS TRANSPORT IS ROUND TRIP OR SCHEDULED AND REPEATED, A PHYSICIAN MUST COMPLETE THIS FORM  Transport from: Scientist, product/process development) [facility]  Transport to (Location) [facility]  Did the patient arrive from a Skilled Nursing Facility, Assisted Living Facility or Group Home? No  Is this the closest appropriate facility? Yes  Date of Transport Service 02/17/22  Name of Transporting Agency [facility]  Round Trip Transport? No  Reason for [facility]  Specific Services Available at 2nd Facility Cardiac Surgery  Is this a hospice patient? No  Describe the Medical Condition severe multivessel coronary artery disease with unstable angine  Q1 Are ALL the following "true"? 1. Patient unable to get up from bed without assistance  AND  2. Unable to ambulate  AND  3. Unable to sit in a chair, including wheelchair. Yes  Q2 Could the patient be transported safely by other means of transportation (I.E., wheelchair van)? No  Q3 Please check any of the following conditions that apply at the time of transport: Requires IV maintenance;Requires cardiac monitoring  Electronic Signature Tempie Hoist  Credentials DP  Date Signed 02/17/22

## 2022-02-17 NOTE — Progress Notes (Signed)
PROGRESS NOTE    Logan Crawford  YDX:[id number] DOB: [birth year] DOA: 02/11/2022 PCP: Pcp, No    Brief Narrative:  This 46 y.o. male with medical history significant for HTN, polysubstance abuse, tobacco use disorder, CAD s/p PCI in 04/2021, G1 DD, EF 60-65 on  10/2021, hospitalized from 11/16/21 to 11/19/21 with NSTEMI for which she underwent repeat Left cardiac cath on 9/25 showing significant two-vessel coronary artery disease which was  treated medically and on DAPT, He presents to the ED with chest pain typical to prior NSTEMI. EMS administered aspirin and sublingual nitro which relieved his pain but after 20 minutes the pain recurred. Troponin trending up 29> 256> 429>835.  EKG shows ST depression V1 to V3 and aVF. Chest x-ray no acute disease.  Patient is started on IV heparin and admitted for further evaluation.  Patient was evaluated by cardiology, recommended left heart cath. Patient underwent left heart cath 02/13/22 which showed recurrent in-stent restenosis in the LAD in the setting of continued cocaine use and poor compliance with antiplatelet medications.  Patient also has significant progression of the RCA disease.  Cardiologist recommended transfer to [facility] for CABG.  Assessment & Plan:   Principal Problem:   NSTEMI (non-ST elevated myocardial infarction) (HCC) Active Problems:   Polysubstance abuse (HCC)   Hypertension   Hyperlipidemia   Tobacco use   Chronic diastolic CHF (congestive heart failure) (HCC)   Cocaine abuse (HCC)  NSTEMI: CAD s/p PCI: Patient presented with typical chest pain like prior NSTEMI. LHC on 11/18/2021 showed significant two-vessel coronary artery disease. He was advised medical management only. Troponin trending up 29> 256> 429>835 Continue IV heparin.  Continue nitroglycerin infusion. Continue aspirin, Brilinta, Lipitor, carvedilol. Cardiology is consulted, Patient underwent left heart cath 12/21 which showed recurrent instent restenosis  in the LAD in the setting of continued cocaine use and poor compliance with antiplatelet medications.  Patient also has significant progression of the RCA disease.   Cardiologist recommended transfer to [facility] for CABG pending bed availability at [facility].   Polysubstance abuse: Urine drug screen positive for cocaine. Counseled on quitting substance abuse.   Essential hypertension Continue losartan, Imdur and carvedilol.   Hyperlipidemia: Continue Lipitor   Tobacco use: Counseled on quit smoking.   Chronic diastolic CHF: Appears euvolemic. Last echo showed LVEF 60 to 65%. Continue Coreg and losartan.   DVT prophylaxis: Heparin iv Code Status: Full code Family Communication: No family at bed side. Disposition Plan:   Status is: Inpatient Remains inpatient appropriate because:   Awaiting transfer to [facility] for CABG, given severe diffuse coronary artery disease    Consultants:  Cardiology  Procedures: LHC Antimicrobials: None  Subjective: Patient was seen and examined at bedside.  Overnight events noted.   Patient reported had intermittent chest pain in the night requiring sublingual nitro. Denies any chest pain at this time,  awaiting transfer to [facility] for CABG.  Objective: Vitals:   02/17/22 0055 02/17/22 0454 02/17/22 0828 02/17/22 1139  BP: 122/77 124/87 (!) 143/92 122/82  Pulse: 70 65 71 72  Resp: 18 18 18 20   Temp: 97.7 F (36.5 C) 98.4 F (36.9 C) 98.4 F (36.9 C) 98 F (36.7 C)  TempSrc:  Oral Oral Oral  SpO2: 96% 95% 97% 94%  Weight:      Height:        Intake/Output Summary (Last 24 hours) at 02/17/2022 1155 Last data filed at 02/16/2022 2300 Gross per 24 hour  Intake 378.55 ml  Output 200 ml  Net 178.55 ml   Filed Weights   02/11/22 1749 02/13/22 1315  Weight: 95.3 kg 95.3 kg    Examination:  General exam: Appears comfortable, deconditioned, not in any distress. Respiratory system: CTA bilaterally, respiratory effort  normal, RR 15. Cardiovascular system: S1-S2 heard, regular rate and rhythm, no murmur. Gastrointestinal system: Abdomen is soft, non tender, non distended, BS+ Central nervous system: Alert and oriented x 3, no focal neurological deficits. Extremities: No edema, no cyanosis, no clubbing. Skin: No rashes, lesions or ulcers Psychiatry: Judgement and insight appear normal. Mood & affect appropriate.     Data Reviewed: I have personally reviewed following labs and imaging studies  CBC: Recent Labs  Lab 02/11/22 1800 02/12/22 0540 02/13/22 0515 02/14/22 0228 02/15/22 0646  WBC 9.2 8.0 7.7 6.9 7.5  HGB 15.1 14.5 14.2 14.1 14.7  HCT 44.0 42.5 41.9 40.8 43.3  MCV 88.0 88.7 89.3 87.9 89.5  PLT 325 295 296 286 271   Basic Metabolic Panel: Recent Labs  Lab 02/13/22 1916 02/14/22 0228 02/15/22 0639 02/16/22 0559 02/17/22 0644  NA 136 138 136 138 138  K 4.0 4.1 4.9 4.6 4.6  CL 107 109 108 110 106  CO2 22 23 18* 22 23  GLUCOSE 141* 100* 70 83 95  BUN 14 15 13 12 16   CREATININE 1.33* 1.59* 1.44* 1.29* 1.47*  CALCIUM 8.5* 8.3* 8.9 8.9 9.1  MG  --  2.1  --   --   --   PHOS  --  4.5  --   --   --    GFR: Estimated Creatinine Clearance: 73.6 mL/min (A) (by C-G formula based on SCr of 1.47 mg/dL (H)). Liver Function Tests: No results for input(s): "AST", "ALT", "ALKPHOS", "BILITOT", "PROT", "ALBUMIN" in the last 168 hours. No results for input(s): "LIPASE", "AMYLASE" in the last 168 hours. No results for input(s): "AMMONIA" in the last 168 hours. Coagulation Profile: Recent Labs  Lab 02/11/22 1800  INR 1.1   Cardiac Enzymes: No results for input(s): "CKTOTAL", "CKMB", "CKMBINDEX", "TROPONINI" in the last 168 hours. BNP (last 3 results) No results for input(s): "PROBNP" in the last 8760 hours. HbA1C: No results for input(s): "HGBA1C" in the last 72 hours. CBG: No results for input(s): "GLUCAP" in the last 168 hours. Lipid Profile: No results for input(s): "CHOL", "HDL",  "LDLCALC", "TRIG", "CHOLHDL", "LDLDIRECT" in the last 72 hours. Thyroid Function Tests: No results for input(s): "TSH", "T4TOTAL", "FREET4", "T3FREE", "THYROIDAB" in the last 72 hours. Anemia Panel: No results for input(s): "VITAMINB12", "FOLATE", "FERRITIN", "TIBC", "IRON", "RETICCTPCT" in the last 72 hours. Sepsis Labs: No results for input(s): "PROCALCITON", "LATICACIDVEN" in the last 168 hours.  Recent Results (from the past 240 hour(s))  Resp panel by RT-PCR (RSV, Flu A&B, Covid) Anterior Nasal Swab     Status: None   Collection Time: 02/11/22  8:34 PM   Specimen: Anterior Nasal Swab  Result Value Ref Range Status   SARS Coronavirus 2 by RT PCR NEGATIVE NEGATIVE Final    Comment: (NOTE) SARS-CoV-2 target nucleic acids are NOT DETECTED.  The SARS-CoV-2 RNA is generally detectable in upper respiratory specimens during the acute phase of infection. The lowest concentration of SARS-CoV-2 viral copies this assay can detect is 138 copies/mL. A negative result does not preclude SARS-Cov-2 infection and should not be used as the sole basis for treatment or other patient management decisions. A negative result may occur with  improper specimen collection/handling, submission of specimen other than nasopharyngeal  swab, presence of viral mutation(s) within the areas targeted by this assay, and inadequate number of viral copies(<138 copies/mL). A negative result must be combined with clinical observations, patient history, and epidemiological information. The expected result is Negative.  Fact Sheet for Patients:  BloggerCourse.com  Fact Sheet for Healthcare Providers:  SeriousBroker.it  This test is no t yet approved or cleared by the [place] FDA and  has been authorized for detection and/or diagnosis of SARS-CoV-2 by FDA under an Emergency Use Authorization (EUA). This EUA will remain  in effect (meaning this test can be used)  for the duration of the COVID-19 declaration under Section 564(b)(1) of the Act, 21 U.S.C.section 360bbb-3(b)(1), unless the authorization is terminated  or revoked sooner.       Influenza A by PCR NEGATIVE NEGATIVE Final   Influenza B by PCR NEGATIVE NEGATIVE Final    Comment: (NOTE) The Xpert Xpress SARS-CoV-2/FLU/RSV plus assay is intended as an aid in the diagnosis of influenza from Nasopharyngeal swab specimens and should not be used as a sole basis for treatment. Nasal washings and aspirates are unacceptable for Xpert Xpress SARS-CoV-2/FLU/RSV testing.  Fact Sheet for Patients: BloggerCourse.com  Fact Sheet for Healthcare Providers: SeriousBroker.it  This test is not yet approved or cleared by the [place] FDA and has been authorized for detection and/or diagnosis of SARS-CoV-2 by FDA under an Emergency Use Authorization (EUA). This EUA will remain in effect (meaning this test can be used) for the duration of the COVID-19 declaration under Section 564(b)(1) of the Act, 21 U.S.C. section 360bbb-3(b)(1), unless the authorization is terminated or revoked.     Resp Syncytial Virus by PCR NEGATIVE NEGATIVE Final    Comment: (NOTE) Fact Sheet for Patients: BloggerCourse.com  Fact Sheet for Healthcare Providers: SeriousBroker.it  This test is not yet approved or cleared by the [place] FDA and has been authorized for detection and/or diagnosis of SARS-CoV-2 by FDA under an Emergency Use Authorization (EUA). This EUA will remain in effect (meaning this test can be used) for the duration of the COVID-19 declaration under Section 564(b)(1) of the Act, 21 U.S.C. section 360bbb-3(b)(1), unless the authorization is terminated or revoked.  Performed at [facility], [address] [place], [place] [address]    Radiology Studies: No results  found.  Scheduled Meds:  aspirin EC  81 mg Oral Daily   atorvastatin  80 mg Oral Daily   carvedilol  25 mg Oral BID WC   isosorbide mononitrate  60 mg Oral BID   pantoprazole  40 mg Oral Daily   sodium chloride flush  3 mL Intravenous Q12H   sodium chloride flush  3 mL Intravenous Q12H   Continuous Infusions:  sodium chloride     sodium chloride     sodium chloride 1 mL/kg/hr (02/13/22 1323)   heparin 1,800 Units/hr (02/17/22 0743)   nitroGLYCERIN       LOS: 6 days    Time spent: 35 mins    Janece Laidlaw, MD [facility]   If 7PM-7AM, please contact night-coverage

## 2022-02-17 NOTE — TOC Progression Note (Signed)
Transition of Care [facility]) - Progression Note    Patient Details  Name: Logan Crawford MRN: [id number] Date of Birth: 10-Dec-1975  [facility]) CM/SW Contact  Tempie Hoist, [place] Phone Number: 02/17/2022, 11:51 AM  Clinical Narrative:      TOC is arranging transport back to [facility]. Patient will need tele and IV for transport. Patient also needs a stretcher.      Expected Discharge Plan and Services    EMS to [facility]     Expected Discharge Date: 02/14/22                                     Social Determinants of Health (SDOH) Interventions SDOH Screenings   Food Insecurity: No Food Insecurity (02/12/2022)  Housing: Low Risk  (02/12/2022)  Transportation Needs: No Transportation Needs (02/12/2022)  Utilities: Not At Risk (02/12/2022)  Tobacco Use: Medium Risk (02/12/2022)    Readmission Risk Interventions     No data to display

## 2022-02-17 NOTE — Progress Notes (Signed)
ANTICOAGULATION CONSULT NOTE   Pharmacy Consult for heparin Indication: chest pain/ACS  No Known Allergies  Patient Measurements: Height: 6' (182.9 cm) Weight: 95.3 kg (210 lb) IBW/kg (Calculated) : 77.6 Heparin Dosing Weight: 95.3  Vital Signs: Temp: 98.4 F (36.9 C) (12/25 0454) Temp Source: Oral (12/25 0454) BP: 124/87 (12/25 0454) Pulse Rate: 65 (12/25 0454)  Labs: Recent Labs    02/15/22 0639 02/15/22 0646 02/16/22 0559 02/16/22 0600 02/17/22 0644  HGB  --  14.7  --   --   --   HCT  --  43.3  --   --   --   PLT  --  271  --   --   --   HEPARINUNFRC  --  0.42  --  0.47 0.27*  CREATININE 1.44*  --  1.29*  --   --      Estimated Creatinine Clearance: 83.9 mL/min (A) (by C-G formula based on SCr of 1.29 mg/dL (H)).   Medical History: Past Medical History:  Diagnosis Date   CAD (coronary artery disease)    a. 11/2019 NSTEMI/PCI: LM min irregs, LAD 77m (2.75x22 Resolute Onyx DES), 40d, D2 min irregs, LCX/LPAV min irregs, RCA 60ost, 40p/m, EF 45-50%; b. 11/2020 MV: EF 43%, small area of apical ischemia ->? over processing-->low risk.   Hyperlipidemia    Hypertension    Ischemic cardiomyopathy    a. 11/2019 LV Gram: EF 45-50%; b. 12/2019 Echo: EF 50% w/ mod mid-apical anteroseptal HK, GrI DD, nl RV fxn, mild Ao sclerosis.   Tobacco use 11/25/2019    Medications: No chronic anticoag PTA Aspirin 81 mg, Ticagrelor 90 mg  Assessment: 46 yo M with PMH HTN, HLD, CAD, NSTEMI (12/2019), unstable angina presents with chest pain. Troponins elevated at 256 and ECG shows possible STEMI, infarcts of indeterminate age, and sinus tachycardia. No chronic anticoag PTA noted from chart review. Hgb, Hct, PLT stable. S/p cath with progression of severe multivessel disease.    Date Time aPTT/HL Rate/Comment 12/20 0149 HL 0.19 Subtherapeutic  12/20 0855 HL 0.38 Therapeutic x 1 @ 1500 units/hr  12/20 1645 HL 0.41 Therapeutic x 2    12/21   0515   HL 0.37           Therapeutic X 3   12/22   0228    HL 0.25          SUBtherapeutic  12/22 0956 HL 0.35  Therapeutic 12/22 1550 HL 0.37 Therapeutic 12/23 0646 HL 0.42 Therapeutic  12/24   0600   HL 0.47            Therapeutic X 4  12/24 0644 HL 0.27 SUBtherapeutic  Goal of Therapy:  Heparin level 0.3-0.7 units/ml Monitor platelets by anticoagulation protocol: Yes   Plan:  12/24:  HL @ 0644: HL 0.27, SUBtherapeutic - Bolus 1500 units x 1, and will increase heparin rate to 1800 units/hr - recheck HL in 6 hours - CBC daily  Bettey Costa, PharmD Clinical Pharmacist 02/17/2022 7:28 AM

## 2022-02-17 NOTE — TOC Transition Note (Signed)
Transition of Care [facility]) - CM/SW Discharge Note   Patient Details  Name: Logan Crawford MRN: [id number] Date of Birth: [birth year]  [facility]) CM/SW Contact:  Tempie Hoist, LCSWA Phone Number: 02/17/2022, 12:41 PM   Clinical Narrative:     TOC informed the patient's friend Emilee Hero [phone] that the patient is going to transfer to [facility] today. [facility] will provide transport. Ardean Larsen will inform the patient's mother of transfer.  Final next level of care: Acute to Acute Transfer     Patient Goals and CMS Choice      Discharge Placement                Patient chooses bed at:  [facility]) Patient to be transferred to facility by: [facility] Name of family member notified: [place] Poli Patient and family notified of of transfer: 02/17/22  Discharge Plan and Services Additional resources added to the After Visit Summary for                                       Social Determinants of Health (SDOH) Interventions SDOH Screenings   Food Insecurity: No Food Insecurity (02/12/2022)  Housing: Low Risk  (02/12/2022)  Transportation Needs: No Transportation Needs (02/12/2022)  Utilities: Not At Risk (02/12/2022)  Tobacco Use: Medium Risk (02/12/2022)     Readmission Risk Interventions     No data to display

## 2022-02-17 NOTE — Progress Notes (Signed)
ANTICOAGULATION CONSULT NOTE   Pharmacy Consult for heparin Indication: chest pain/ACS  No Known Allergies  Patient Measurements: Height: 6' (182.9 cm) Weight: 100.9 kg (222 lb 6.4 oz) IBW/kg (Calculated) : 77.6 Heparin Dosing Weight: 95.3  Vital Signs: Temp: 97.8 F (36.6 C) (12/25 1500) Temp Source: Oral (12/25 1500) BP: 104/58 (12/25 1500) Pulse Rate: 69 (12/25 1500)  Labs: Recent Labs    02/15/22 0639 02/15/22 0646 02/16/22 0559 02/16/22 0600 02/17/22 0644 02/17/22 1408  HGB  --  14.7  --   --   --   --   HCT  --  43.3  --   --   --   --   PLT  --  271  --   --   --   --   HEPARINUNFRC  --  0.42  --  0.47 0.27* 0.44  CREATININE 1.44*  --  1.29*  --  1.47*  --      Estimated Creatinine Clearance: 75.5 mL/min (A) (by C-G formula based on SCr of 1.47 mg/dL (H)).   Medical History: Past Medical History:  Diagnosis Date   CAD (coronary artery disease)    a. 11/2019 NSTEMI/PCI: LM min irregs, LAD 86m (2.75x22 Resolute Onyx DES), 40d, D2 min irregs, LCX/LPAV min irregs, RCA 60ost, 40p/m, EF 45-50%; b. 11/2020 MV: EF 43%, small area of apical ischemia ->? over processing-->low risk.   Hyperlipidemia    Hypertension    Ischemic cardiomyopathy    a. 11/2019 LV Gram: EF 45-50%; b. 12/2019 Echo: EF 50% w/ mod mid-apical anteroseptal HK, GrI DD, nl RV fxn, mild Ao sclerosis.   Tobacco use 11/25/2019    Medications: No chronic anticoag PTA Aspirin 81 mg, Ticagrelor 90 mg  Assessment: 46 yo M with PMH HTN, HLD, CAD, NSTEMI (12/2019), unstable angina presents with chest pain. Troponins elevated at 256 and ECG shows possible STEMI, infarcts of indeterminate age, and sinus tachycardia. No chronic anticoag PTA noted from chart review. Hgb, Hct, PLT stable. S/p cath with progression of severe multivessel disease. Patient transferred to [facility] on 12/25 for CABG eval.   Heparin level drawn at [facility] therapeutic at 0.44 therapeutic on 1800 units/hr. Per RN, heparin has been  running continuously without issues and was not paused during patient transfer, and no bleeding noted. CBC from 12/23 stable.   Goal of Therapy:  Heparin level 0.3-0.7 units/ml Monitor platelets by anticoagulation protocol: Yes   Plan:  Continue heparin at 1800 units/hr  Daily heparin level and CBC Monitor s/sx bleeding   Larena Sox, PharmD PGY1 Pharmacy Resident   02/17/2022  3:27 PM

## 2022-02-17 NOTE — Progress Notes (Signed)
Rounding Note    Patient Name: Logan Crawford Date of Encounter: 02/17/2022  [facility] Cardiologist: Debbe Odea, MD   Subjective   Denies chest pain or shortness of breath.  Family at bedside.  Inpatient Medications    Scheduled Meds:  aspirin EC  81 mg Oral Daily   atorvastatin  80 mg Oral Daily   carvedilol  25 mg Oral BID WC   isosorbide mononitrate  60 mg Oral BID   pantoprazole  40 mg Oral Daily   sodium chloride flush  3 mL Intravenous Q12H   sodium chloride flush  3 mL Intravenous Q12H   Continuous Infusions:  sodium chloride     sodium chloride     sodium chloride 1 mL/kg/hr (02/13/22 1323)   heparin 1,800 Units/hr (02/17/22 0743)   nitroGLYCERIN     PRN Meds: sodium chloride, sodium chloride, acetaminophen **OR** acetaminophen, albuterol, hydrALAZINE, hydrocortisone cream, morphine injection, nitroGLYCERIN, ondansetron **OR** ondansetron (ZOFRAN) IV, mouth rinse, sodium chloride flush, sodium chloride flush   Vital Signs    Vitals:   02/17/22 0055 02/17/22 0454 02/17/22 0828 02/17/22 1139  BP: 122/77 124/87 (!) 143/92 122/82  Pulse: 70 65 71 72  Resp: 18 18 18 20   Temp: 97.7 F (36.5 C) 98.4 F (36.9 C) 98.4 F (36.9 C) 98 F (36.7 C)  TempSrc:  Oral Oral Oral  SpO2: 96% 95% 97% 94%  Weight:      Height:        Intake/Output Summary (Last 24 hours) at 02/17/2022 1209 Last data filed at 02/16/2022 2300 Gross per 24 hour  Intake 378.55 ml  Output 200 ml  Net 178.55 ml      02/13/2022    1:15 PM 02/11/2022    5:49 PM 11/21/2021   10:24 AM  Last 3 Weights  Weight (lbs) 210 lb 210 lb 225 lb 12.8 oz  Weight (kg) 95.255 kg 95.255 kg 102.422 kg      Telemetry    Sinus rhythm, heart rate 88- Personally Reviewed  ECG     - Personally Reviewed  Physical Exam   GEN: No acute distress.   Neck: No JVD Cardiac: RRR, no murmurs, rubs, or gallops.  Respiratory: Clear to auscultation bilaterally. GI: Soft, nontender,  non-distended  MS: No edema; No deformity. Neuro:  Nonfocal  Psych: Normal affect   Labs    High Sensitivity Troponin:   Recent Labs  Lab 02/11/22 1800 02/11/22 2018 02/12/22 0855 02/13/22 1915  TROPONINIHS 256* 429* 839* 341*     Chemistry Recent Labs  Lab 02/14/22 0228 02/15/22 0639 02/16/22 0559 02/17/22 0644  NA 138 136 138 138  K 4.1 4.9 4.6 4.6  CL 109 108 110 106  CO2 23 18* 22 23  GLUCOSE 100* 70 83 95  BUN 15 13 12 16   CREATININE 1.59* 1.44* 1.29* 1.47*  CALCIUM 8.3* 8.9 8.9 9.1  MG 2.1  --   --   --   GFRNONAA 53* 60* >60 58*  ANIONGAP 6 10 6 9     Lipids No results for input(s): "CHOL", "TRIG", "HDL", "LABVLDL", "LDLCALC", "CHOLHDL" in the last 168 hours.  Hematology Recent Labs  Lab 02/13/22 0515 02/14/22 0228 02/15/22 0646  WBC 7.7 6.9 7.5  RBC 4.69 4.64 4.84  HGB 14.2 14.1 14.7  HCT 41.9 40.8 43.3  MCV 89.3 87.9 89.5  MCH 30.3 30.4 30.4  MCHC 33.9 34.6 33.9  RDW 13.9 14.0 14.2  PLT 296 286 271   Thyroid  No results for input(s): "TSH", "FREET4" in the last 168 hours.  BNPNo results for input(s): "BNP", "PROBNP" in the last 168 hours.  DDimer No results for input(s): "DDIMER" in the last 168 hours.   Radiology    No results found.  Cardiac Studies   Limited TTE 02/12/2022   1. Left ventricular ejection fraction, by estimation, is 60 to 65%. The  left ventricle has normal function. The left ventricle has no regional  wall motion abnormalities.   2. Right ventricular systolic function is normal. The right ventricular  size is normal.   3. The mitral valve is normal in structure. Mild mitral valve  regurgitation.   Patient Profile     46 y.o. male with history of CAD/PCI x2( mid LAD 2021, 04/2021), hypertension, current smoker, cocaine use presenting with chest pain diagnosed with NSTEMI.   Assessment & Plan    1.  CAD/NSTEMI prior PCI -Left heart cath this admission showed significant LAD and RCA disease -transfer to [facility]  for CABG eval when bed becomes available. -oreg to 25 mg twice daily, Imdur to 60 mg twice daily -Continue aspirin, Lipitor 80, heparin drip -Hold Brilinta in anticipation for CABG -Echo with preserved EF   2.  Hypertension -Coreg, Imdur.  Will  3.  AKI -Continue to avoid nephrotoxic's   Greater than 50% was spent in counseling and coordination of care with patient Total encounter time 50 minutes        Signed, Debbe Odea, MD  02/17/2022, 12:09 PM

## 2022-02-17 NOTE — Progress Notes (Addendum)
Spoke with Dr. Delia Chimes, CVTS will see tomorrow  Patient seen, doing ok, denies any chest pain, but does have head. Instructed patient to contact nurse if any change in overall symptom

## 2022-02-18 ENCOUNTER — Inpatient Hospital Stay (HOSPITAL_COMMUNITY): Payer: Medicaid Other

## 2022-02-18 ENCOUNTER — Other Ambulatory Visit: Payer: Self-pay

## 2022-02-18 ENCOUNTER — Other Ambulatory Visit (HOSPITAL_BASED_OUTPATIENT_CLINIC_OR_DEPARTMENT_OTHER): Payer: Self-pay | Admitting: Osteopathic Medicine

## 2022-02-18 DIAGNOSIS — I10 Essential (primary) hypertension: Secondary | ICD-10-CM

## 2022-02-18 DIAGNOSIS — E785 Hyperlipidemia, unspecified: Secondary | ICD-10-CM

## 2022-02-18 DIAGNOSIS — Z01810 Encounter for preprocedural cardiovascular examination: Secondary | ICD-10-CM

## 2022-02-18 DIAGNOSIS — I214 Non-ST elevation (NSTEMI) myocardial infarction: Secondary | ICD-10-CM

## 2022-02-18 DIAGNOSIS — Z22322 Carrier or suspected carrier of Methicillin resistant Staphylococcus aureus: Secondary | ICD-10-CM

## 2022-02-18 DIAGNOSIS — I2510 Atherosclerotic heart disease of native coronary artery without angina pectoris: Secondary | ICD-10-CM

## 2022-02-18 DIAGNOSIS — R008 Other abnormalities of heart beat: Secondary | ICD-10-CM

## 2022-02-18 HISTORY — DX: Carrier or suspected carrier of methicillin resistant Staphylococcus aureus: Z22.322

## 2022-02-18 LAB — ECHOCARDIOGRAM LIMITED
Height: 72 in
S' Lateral: 3.4 cm
Single Plane A2C EF: 56.7 %
Weight: 3558.4 oz

## 2022-02-18 LAB — URINALYSIS, ROUTINE W REFLEX MICROSCOPIC
Bacteria, UA: NONE SEEN
Bilirubin Urine: NEGATIVE
Glucose, UA: NEGATIVE mg/dL
Ketones, ur: NEGATIVE mg/dL
Leukocytes,Ua: NEGATIVE
Nitrite: NEGATIVE
Protein, ur: NEGATIVE mg/dL
Specific Gravity, Urine: 1.015 (ref 1.005–1.030)
pH: 7 (ref 5.0–8.0)

## 2022-02-18 LAB — BASIC METABOLIC PANEL
Anion gap: 9 (ref 5–15)
BUN: 13 mg/dL (ref 6–20)
CO2: 21 mmol/L — ABNORMAL LOW (ref 22–32)
Calcium: 8.8 mg/dL — ABNORMAL LOW (ref 8.9–10.3)
Chloride: 107 mmol/L (ref 98–111)
Creatinine, Ser: 1.41 mg/dL — ABNORMAL HIGH (ref 0.61–1.24)
GFR, Estimated: >60 mL/min (ref >60)
Glucose, Bld: 117 mg/dL — ABNORMAL HIGH (ref 70–99)
Potassium: 4.5 mmol/L (ref 3.5–5.1)
Sodium: 137 mmol/L (ref 135–145)

## 2022-02-18 LAB — CBC
HCT: 41.3 % (ref 39.0–52.0)
Hemoglobin: 13.8 g/dL (ref 13.0–17.0)
MCH: 30.8 pg (ref 26.0–34.0)
MCHC: 33.4 g/dL (ref 30.0–36.0)
MCV: 92.2 fL (ref 80.0–100.0)
Platelets: 274 10*3/uL (ref 150–400)
RBC: 4.48 MIL/uL (ref 4.22–5.81)
RDW: 14.1 % (ref 11.5–15.5)
WBC: 9.4 10*3/uL (ref 4.0–10.5)
nRBC: 0 % (ref 0.0–0.2)

## 2022-02-18 LAB — SURGICAL PCR SCREEN
MRSA, PCR: POSITIVE — AB
Staphylococcus aureus: POSITIVE — AB

## 2022-02-18 LAB — HEPARIN LEVEL (UNFRACTIONATED): Heparin Unfractionated: 0.56 IU/mL (ref 0.30–0.70)

## 2022-02-18 LAB — ABO/RH: ABO/RH(D): O POS

## 2022-02-18 MED ORDER — METOPROLOL TARTRATE 12.5 MG HALF TABLET
12.5000 mg | ORAL_TABLET | Freq: Once | ORAL | Status: AC
  Administered 2022-02-19: 12.5 mg via ORAL
  Filled 2022-02-18: qty 1

## 2022-02-18 MED ORDER — CHLORHEXIDINE GLUCONATE 4 % EX LIQD
60.0000 mL | Freq: Once | CUTANEOUS | Status: AC
  Administered 2022-02-19: 4 via TOPICAL
  Filled 2022-02-18 (×2): qty 60

## 2022-02-18 MED ORDER — CHLORHEXIDINE GLUCONATE CLOTH 2 % EX PADS
6.0000 | MEDICATED_PAD | Freq: Every day | CUTANEOUS | Status: DC
  Administered 2022-02-19: 6 via TOPICAL

## 2022-02-18 MED ORDER — MUPIROCIN 2 % EX OINT
1.0000 | TOPICAL_OINTMENT | Freq: Two times a day (BID) | CUTANEOUS | Status: DC
  Administered 2022-02-18: 1 via NASAL
  Filled 2022-02-18: qty 22

## 2022-02-18 MED ORDER — ATORVASTATIN CALCIUM 80 MG PO TABS
80.0000 mg | ORAL_TABLET | Freq: Every day | ORAL | Status: DC
  Administered 2022-02-18: 80 mg via ORAL
  Filled 2022-02-18: qty 1

## 2022-02-18 MED ORDER — CHLORHEXIDINE GLUCONATE 4 % EX LIQD
60.0000 mL | Freq: Once | CUTANEOUS | Status: DC
  Filled 2022-02-18: qty 60

## 2022-02-18 MED ORDER — TEMAZEPAM 7.5 MG PO CAPS
15.0000 mg | ORAL_CAPSULE | Freq: Once | ORAL | Status: AC | PRN
  Administered 2022-02-18: 15 mg via ORAL
  Filled 2022-02-18: qty 2

## 2022-02-18 MED ORDER — CHLORHEXIDINE GLUCONATE 0.12 % MT SOLN
15.0000 mL | Freq: Once | OROMUCOSAL | Status: AC
  Administered 2022-02-19: 15 mL via OROMUCOSAL
  Filled 2022-02-18: qty 15

## 2022-02-18 MED ORDER — BISACODYL 5 MG PO TBEC
5.0000 mg | DELAYED_RELEASE_TABLET | Freq: Once | ORAL | Status: AC
  Administered 2022-02-18: 5 mg via ORAL
  Filled 2022-02-18: qty 1

## 2022-02-18 NOTE — Progress Notes (Signed)
ANTICOAGULATION CONSULT NOTE   Pharmacy Consult for heparin Indication: chest pain/ACS  No Known Allergies  Patient Measurements: Height: 6' (182.9 cm) Weight: 100.9 kg (222 lb 6.4 oz) IBW/kg (Calculated) : 77.6 Heparin Dosing Weight: 95.3  Vital Signs: Temp: 98.3 F (36.8 C) (12/26 0601) Temp Source: Oral (12/26 0601) BP: 132/91 (12/26 0601) Pulse Rate: 67 (12/26 0601)  Labs: Recent Labs    02/16/22 0559 02/16/22 0600 02/17/22 0644 02/17/22 1408 02/18/22 0230  HGB  --   --   --   --  13.8  HCT  --   --   --   --  41.3  PLT  --   --   --   --  274  HEPARINUNFRC  --    < > 0.27* 0.44 0.56  CREATININE 1.29*  --  1.47*  --  1.41*   < > = values in this interval not displayed.     Estimated Creatinine Clearance: 78.8 mL/min (A) (by C-G formula based on SCr of 1.41 mg/dL (H)).   Medical History: Past Medical History:  Diagnosis Date   CAD (coronary artery disease)    a. 11/2019 NSTEMI/PCI: LM min irregs, LAD 26m (2.75x22 Resolute Onyx DES), 40d, D2 min irregs, LCX/LPAV min irregs, RCA 60ost, 40p/m, EF 45-50%; b. 11/2020 MV: EF 43%, small area of apical ischemia ->? over processing-->low risk.   Hyperlipidemia    Hypertension    Ischemic cardiomyopathy    a. 11/2019 LV Gram: EF 45-50%; b. 12/2019 Echo: EF 50% w/ mod mid-apical anteroseptal HK, GrI DD, nl RV fxn, mild Ao sclerosis.   Tobacco use 11/25/2019     Assessment: 46 yoM admitted with NSTEMI and found to have mvCAD. Pharmacy managing IV heparin.  Heparin level therapeutic, CBC stable.  Goal of Therapy:  Heparin level 0.3-0.7 units/ml Monitor platelets by anticoagulation protocol: Yes   Plan:  Continue heparin at 1800 units/hr  Daily heparin level and CBC Monitor s/sx bleeding   Fredonia Highland, PharmD, BCPS, [facility] Clinical Pharmacist [phone] Please check AMION for all [facility] Pharmacy numbers 02/18/2022

## 2022-02-18 NOTE — Plan of Care (Signed)
  Problem: Education: Goal: Understanding of cardiac disease, CV risk reduction, and recovery process will improve Outcome: Progressing Goal: Individualized Educational Video(s) Outcome: Progressing   Problem: Activity: Goal: Ability to tolerate increased activity will improve Outcome: Progressing   Problem: Cardiac: Goal: Ability to achieve and maintain adequate cardiovascular perfusion will improve Outcome: Progressing   Problem: Health Behavior/Discharge Planning: Goal: Ability to safely manage health-related needs after discharge will improve Outcome: Progressing   Problem: Education: Goal: Understanding of CV disease, CV risk reduction, and recovery process will improve Outcome: Progressing Goal: Individualized Educational Video(s) Outcome: Progressing   Problem: Activity: Goal: Ability to return to baseline activity level will improve Outcome: Progressing   Problem: Cardiovascular: Goal: Ability to achieve and maintain adequate cardiovascular perfusion will improve Outcome: Progressing Goal: Vascular access site(s) Level 0-1 will be maintained Outcome: Progressing   Problem: Health Behavior/Discharge Planning: Goal: Ability to safely manage health-related needs after discharge will improve Outcome: Progressing   Problem: Education: Goal: Knowledge of General Education information will improve Description: Including pain rating scale, medication(s)/side effects and non-pharmacologic comfort measures Outcome: Progressing   Problem: Health Behavior/Discharge Planning: Goal: Ability to manage health-related needs will improve Outcome: Progressing   Problem: Clinical Measurements: Goal: Ability to maintain clinical measurements within normal limits will improve Outcome: Progressing Goal: Will remain free from infection Outcome: Progressing Goal: Diagnostic test results will improve Outcome: Progressing Goal: Respiratory complications will improve Outcome:  Progressing Goal: Cardiovascular complication will be avoided Outcome: Progressing   Problem: Activity: Goal: Risk for activity intolerance will decrease Outcome: Progressing   Problem: Nutrition: Goal: Adequate nutrition will be maintained Outcome: Progressing   Problem: Coping: Goal: Level of anxiety will decrease Outcome: Progressing   Problem: Elimination: Goal: Will not experience complications related to bowel motility Outcome: Progressing Goal: Will not experience complications related to urinary retention Outcome: Progressing   Problem: Pain Managment: Goal: General experience of comfort will improve Outcome: Progressing   Problem: Safety: Goal: Ability to remain free from injury will improve Outcome: Progressing   Problem: Skin Integrity: Goal: Risk for impaired skin integrity will decrease Outcome: Progressing   Problem: Education: Goal: Knowledge of General Education information will improve Description: Including pain rating scale, medication(s)/side effects and non-pharmacologic comfort measures Outcome: Progressing   Problem: Health Behavior/Discharge Planning: Goal: Ability to manage health-related needs will improve Outcome: Progressing   Problem: Clinical Measurements: Goal: Ability to maintain clinical measurements within normal limits will improve Outcome: Progressing Goal: Will remain free from infection Outcome: Progressing Goal: Diagnostic test results will improve Outcome: Progressing Goal: Respiratory complications will improve Outcome: Progressing Goal: Cardiovascular complication will be avoided Outcome: Progressing   Problem: Activity: Goal: Risk for activity intolerance will decrease Outcome: Progressing   Problem: Nutrition: Goal: Adequate nutrition will be maintained Outcome: Progressing   Problem: Coping: Goal: Level of anxiety will decrease Outcome: Progressing   Problem: Elimination: Goal: Will not experience  complications related to bowel motility Outcome: Progressing Goal: Will not experience complications related to urinary retention Outcome: Progressing   Problem: Pain Managment: Goal: General experience of comfort will improve Outcome: Progressing   Problem: Safety: Goal: Ability to remain free from injury will improve Outcome: Progressing   Problem: Skin Integrity: Goal: Risk for impaired skin integrity will decrease Outcome: Progressing

## 2022-02-18 NOTE — H&P (Addendum)
[address]       [place] [address]             [phone]                    KAGAN MUTCHLER [facility] Medical Record #[id number] Date of Birth: [birth year]  Referring: Marykay Lex, MD Primary Care: Pcp, No Primary Cardiologist: Debbe Odea, MD  Chief Complaint:   No chief complaint on file.   History of Present Illness:    This 46 y.o. male with medical history significant for HTN, polysubstance abuse, tobacco use disorder, CAD s/p PCI in 04/2021, G1 DD, EF 60-65 on  10/2021, hospitalized from 11/16/21 to 11/19/21 with NSTEMI for which she underwent repeat Left cardiac cath on 9/25 showing significant two-vessel coronary artery disease which was  treated medically and on DAPT, He presents to the ED with chest pain typical to prior NSTEMI. EMS administered aspirin and sublingual nitro which relieved his pain but after 20 minutes the pain recurred. Troponin trending up 29> 256> 429>835.  EKG shows ST depression V1 to V3 and aVF. Chest x-ray no acute disease.  Patient is started on IV heparin and admitted for further evaluation.  Patient was evaluated by cardiology, recommended left heart cath. Patient underwent left heart cath 02/13/22 which showed recurrent in-stent restenosis in the LAD in the setting of continued cocaine use and poor compliance with antiplatelet medications.  Patient also has significant progression of the RCA disease.  Cardiologist recommended transfer to [facility] for CABG.     Past Medical History:  Diagnosis Date   CAD (coronary artery disease)    a. 11/2019 NSTEMI/PCI: LM min irregs, LAD 4m (2.75x22 Resolute Onyx DES), 40d, D2 min irregs, LCX/LPAV min irregs, RCA 60ost, 40p/m, EF 45-50%; b. 11/2020 MV: EF 43%, small area of apical ischemia ->? over processing-->low risk.   Hyperlipidemia    Hypertension    Ischemic cardiomyopathy    a. 11/2019 LV Gram: EF 45-50%; b. 12/2019 Echo: EF 50% w/ mod mid-apical anteroseptal HK, GrI  DD, nl RV fxn, mild Ao sclerosis.   Tobacco use 11/25/2019    Past Surgical History:  Procedure Laterality Date   CORONARY STENT INTERVENTION N/A 01/24/2020   Procedure: CORONARY STENT INTERVENTION;  Surgeon: Iran Ouch, MD;  Location: [facility] INVASIVE CV LAB;  Service: Cardiovascular;  Laterality: N/A;   CORONARY STENT INTERVENTION N/A 05/20/2021   Procedure: CORONARY STENT INTERVENTION;  Surgeon: Iran Ouch, MD;  Location: [facility] INVASIVE CV LAB;  Service: Cardiovascular;  Laterality: N/A;   INTRAVASCULAR ULTRASOUND/IVUS N/A 05/20/2021   Procedure: Intravascular Ultrasound/IVUS;  Surgeon: Iran Ouch, MD;  Location: [facility] INVASIVE CV LAB;  Service: Cardiovascular;  Laterality: N/A;   IRRIGATION AND DEBRIDEMENT KNEE Left    LEFT HEART CATH AND CORONARY ANGIOGRAPHY N/A 01/24/2020   Procedure: LEFT HEART CATH AND CORONARY ANGIOGRAPHY;  Surgeon: Iran Ouch, MD;  Location: [facility] INVASIVE CV LAB;  Service: Cardiovascular;  Laterality: N/A;   LEFT HEART CATH AND CORONARY ANGIOGRAPHY N/A 11/18/2021   Procedure: LEFT HEART CATH AND CORONARY ANGIOGRAPHY;  Surgeon: Iran Ouch, MD;  Location: [facility] INVASIVE CV LAB;  Service: Cardiovascular;  Laterality: N/A;   LEFT HEART CATH AND CORS/GRAFTS ANGIOGRAPHY N/A 05/20/2021   Procedure: LEFT HEART CATH AND CORS/GRAFTS ANGIOGRAPHY;  Surgeon: Iran Ouch, MD;  Location: [facility] INVASIVE CV LAB;  Service: Cardiovascular;  Laterality: N/A;   TYMPANOSTOMY TUBE PLACEMENT Bilateral  Family History  Problem Relation Age of Onset   Coronary artery disease Mother    Arrhythmia Mother        Pacemaker placed in 2021   Heart failure Mother    Coronary artery disease Father      Social History   Tobacco Use  Smoking Status Former   Packs/day: 1.00   Years: 22.00   Total pack years: 22.00   Types: Cigarettes   Quit date: 01/23/2020   Years since quitting: 2.0  Smokeless Tobacco Never  Tobacco Comments   Smokes 1-2 cigarettes  daily      Social History   Substance and Sexual Activity  Alcohol Use Yes   Alcohol/week: 1.0 standard drink of alcohol   Types: 1 Shots of liquor per week   Comment: socially     No Known Allergies  Current Facility-Administered Medications  Medication Dose Route Frequency Provider Last Rate Last Admin   acetaminophen (TYLENOL) tablet 650 mg  650 mg Oral Q4H PRN Azalee Course, PA   650 mg at 02/17/22 2247   aspirin EC tablet 81 mg  81 mg Oral Daily Azalee Course, [place]   81 mg at 02/18/22 0826   atorvastatin (LIPITOR) tablet 80 mg  80 mg Oral Daily Laverda Page B, NP   80 mg at 02/18/22 0826   carvedilol (COREG) tablet 25 mg  25 mg Oral BID WC Azalee Course, PA   25 mg at 02/18/22 0827   [START ON 02/19/2022] chlorhexidine (HIBICLENS) 4 % liquid 4 Application  60 mL Topical Once Yitzel Shasteen, Waverly Ferrari, MD       [START ON 02/19/2022] chlorhexidine (PERIDEX) 0.12 % solution 15 mL  15 mL Mouth/Throat Once Jodene Polyak, Waverly Ferrari, MD       [START ON 02/19/2022] Chlorhexidine Gluconate Cloth 2 % PADS 6 each  6 each Topical Q0600 Meriam Sprague, MD       heparin ADULT infusion 100 units/mL (25000 units/257mL)  1,800 Units/hr Intravenous Continuous Rosanna Randy, RPH 18 mL/hr at 02/18/22 1322 1,800 Units/hr at 02/18/22 1322   isosorbide mononitrate (IMDUR) 24 hr tablet 60 mg  60 mg Oral BID Azalee Course, PA   60 mg at 02/18/22 0826   [START ON 02/19/2022] metoprolol tartrate (LOPRESSOR) tablet 12.5 mg  12.5 mg Oral Once Sitara Cashwell, Waverly Ferrari, MD       mupirocin ointment (BACTROBAN) 2 % 1 Application  1 Application Nasal BID Meriam Sprague, MD       nitroGLYCERIN (NITROSTAT) SL tablet 0.4 mg  0.4 mg Sublingual Q5 Min x 3 PRN Azalee Course, PA   0.4 mg at 02/18/22 1627   ondansetron (ZOFRAN) injection 4 mg  4 mg Intravenous Q6H PRN Azalee Course, PA       Oral care mouth rinse  15 mL Mouth Rinse PRN Meriam Sprague, MD       pantoprazole (PROTONIX) EC tablet 40 mg  40 mg Oral Daily Azalee Course, PA   40 mg at  02/18/22 0826   temazepam (RESTORIL) capsule 15 mg  15 mg Oral Once PRN Jyles Sontag, Waverly Ferrari, MD        ROS 14 point ROS reviewed and negative except as per HPI   PHYSICAL EXAMINATION: BP 106/63 (BP Location: Right Arm)   Pulse 70   Temp 98.3 F (36.8 C) (Axillary)   Resp 20   Ht 6' (1.829 m)   Wt 100.9 kg   SpO2 98%   BMI 30.16 kg/m   Gen:  NAD Neuro: Alert and oriented CV: regular. No prior sternotomy scar Resp: Nonlaboured Abd: Soft, ntnd Extr: WWP  Diagnostic Studies & Laboratory data:     Recent Radiology Findings:   VAS US DOPPLER PRE CABG  Result Date: 02/18/2022 PREOPERATIVE VASCULAR EVALUATION Patient Name:  SILVIANO NEUSER  Date of Exam:   02/18/2022 Medical Rec #: [id number]         Accession #:    [id number] Date of Birth: 02-Jul-1975        Patient Gender: M Patient Age:   11 years Exam Location:  [facility] Procedure:      VAS US DOPPLER PRE CABG Referring Phys: Reuel Boom Alethea Terhaar --------------------------------------------------------------------------------  Indications:  Pre-CABG. Risk Factors: Hypertension, hyperlipidemia, past history of smoking, coronary               artery disease. Performing Technologist: Marilynne Halsted RDMS, RVT  Examination Guidelines: A complete evaluation includes B-mode imaging, spectral Doppler, color Doppler, and power Doppler as needed of all accessible portions of each vessel. Bilateral testing is considered an integral part of a complete examination. Limited examinations for reoccurring indications may be performed as noted.  Right Carotid Findings: +----------+--------+--------+--------+--------+------------------+           PSV cm/sEDV cm/sStenosisDescribeComments           +----------+--------+--------+--------+--------+------------------+ CCA Prox  90      21                                         +----------+--------+--------+--------+--------+------------------+ CCA Distal121     31                       intimal thickening +----------+--------+--------+--------+--------+------------------+ ICA Prox  75      24                      intimal thickening +----------+--------+--------+--------+--------+------------------+ ICA Distal51      23                                         +----------+--------+--------+--------+--------+------------------+ ECA       153     19                                         +----------+--------+--------+--------+--------+------------------+ +----------+--------+-------+----------------+------------+           PSV cm/sEDV cmsDescribe        Arm Pressure +----------+--------+-------+----------------+------------+ [id number]            Multiphasic, WNL             +----------+--------+-------+----------------+------------+ +---------+--------+--+--------+--+---------+ VertebralPSV cm/s27EDV cm/s13Antegrade +---------+--------+--+--------+--+---------+ Left Carotid Findings: +----------+--------+--------+--------+--------+------------------+           PSV cm/sEDV cm/sStenosisDescribeComments           +----------+--------+--------+--------+--------+------------------+ CCA Prox  95      24                                         +----------+--------+--------+--------+--------+------------------+ CCA Distal115     31  intimal thickening +----------+--------+--------+--------+--------+------------------+ ICA Prox  79      30                      intimal thickening +----------+--------+--------+--------+--------+------------------+ ICA Distal81      33                                         +----------+--------+--------+--------+--------+------------------+ ECA       144     12                                         +----------+--------+--------+--------+--------+------------------+ +----------+--------+--------+----------------+------------+ SubclavianPSV cm/sEDV cm/sDescribe         Arm Pressure +----------+--------+--------+----------------+------------+           143             Multiphasic, WNL             +----------+--------+--------+----------------+------------+ +---------+--------+--+--------+--+---------+ VertebralPSV cm/s39EDV cm/s17Antegrade +---------+--------+--+--------+--+---------+  ABI Findings: +--------+------------------+-----+---------+--------+ Right   Rt Pressure (mmHg)IndexWaveform Comment  +--------+------------------+-----+---------+--------+ Brachial                       triphasic         +--------+------------------+-----+---------+--------+ ATA                            triphasic         +--------+------------------+-----+---------+--------+ PTA                            triphasic         +--------+------------------+-----+---------+--------+ +--------+------------------+-----+---------+-------+ Left    Lt Pressure (mmHg)IndexWaveform Comment +--------+------------------+-----+---------+-------+ Brachial                       triphasic        +--------+------------------+-----+---------+-------+ ATA                            triphasic        +--------+------------------+-----+---------+-------+ PTA                            triphasic        +--------+------------------+-----+---------+-------+  Right Doppler Findings: +--------+--------+-----+---------+--------+ Site    PressureIndexDoppler  Comments +--------+--------+-----+---------+--------+ Brachial             triphasic         +--------+--------+-----+---------+--------+ Radial               biphasic          +--------+--------+-----+---------+--------+ Ulnar                biphasic          +--------+--------+-----+---------+--------+  Left Doppler Findings: +--------+--------+-----+---------+--------+ Site    PressureIndexDoppler  Comments +--------+--------+-----+---------+--------+ Brachial              triphasic         +--------+--------+-----+---------+--------+   Summary: Right Carotid: Velocities in the right ICA are consistent with a 1-39% stenosis. Left Carotid: Velocities in the left ICA are consistent with a 1-39% stenosis. Vertebrals: Bilateral vertebral arteries demonstrate antegrade flow. Right ABI:  Normal waveforms. Left ABI: Normal waveforms. Right Upper Extremity: Doppler waveforms remain within normal limits with right radial compression. Doppler waveform obliterate with right ulnar compression. Left Upper Extremity: Doppler waveforms remain within normal limits with left radial compression. Doppler waveforms remain within normal limits with left ulnar compression.  Electronically signed by Heath Lark on 02/18/2022 at 4:34:15 PM.    Final    CT chest w/o contrast  Result Date: 02/18/2022 CLINICAL DATA:  Preop evaluation prior to CABG. EXAM: CT CHEST WITHOUT CONTRAST TECHNIQUE: Multidetector CT imaging of the chest was performed following the standard protocol without IV contrast. RADIATION DOSE REDUCTION: This exam was performed according to the departmental dose-optimization program which includes automated exposure control, adjustment of the mA and/or kV according to patient size and/or use of iterative reconstruction technique. COMPARISON:  None Available. FINDINGS: Cardiovascular: No significant vascular findings. Normal heart size. No pericardial effusion. Thoracic aortic atherosclerosis. Coronary artery atherosclerosis with dense calcification in the LAD and a LAD stent. Mediastinum/Nodes: No enlarged mediastinal or axillary lymph nodes. Thyroid gland, trachea, and esophagus demonstrate no significant findings. Small hiatal hernia. Lungs/Pleura: Lungs are clear. No pleural effusion or pneumothorax. Bilateral upper paramediastinal blebs. Upper Abdomen: No acute abnormality. Musculoskeletal: No chest wall mass or suspicious bone lesions identified. IMPRESSION: 1. No acute  intrathoracic findings. 2. Coronary artery atherosclerosis with dense calcification in the LAD and a LAD stent. 3. Small hiatal hernia. 4.  Aortic Atherosclerosis (ICD10-I70.0). Electronically Signed   By: Elige Ko M.D.   On: 02/18/2022 12:14   ECHOCARDIOGRAM LIMITED  Result Date: 02/18/2022    ECHOCARDIOGRAM REPORT   Patient Name:   KJUAN SEIPP Date of Exam: 02/18/2022 Medical Rec #:  [id number]        Height:       72.0 in Accession #:    [id number]       Weight:       222.4 lb Date of Birth:  1975-11-10       BSA:          2.229 m Patient Age:    46 years         BP:           138/81 mmHg Patient Gender: M                HR:           68 bpm. Exam Location:  Inpatient Procedure: Limited Echo, Color Doppler and Cardiac Doppler Indications:    other cardiac sounds  History:        Patient has prior history of Echocardiogram examinations, most                 recent 02/12/2022. CAD, Signs/Symptoms:Chest Pain; Risk                 Factors:Current Smoker, Hypertension and Dyslipidemia.  Sonographer:    Delcie Roch RDCS Referring Phys: [id number] Eann Cleland H Michela Herst IMPRESSIONS  1. Small area of distal septal/inferior basal hypokinesis . Left ventricular ejection fraction, by estimation, is 55%. The left ventricle has normal function. The left ventricle has no regional wall motion abnormalities. There is mild left ventricular hypertrophy. Left ventricular diastolic parameters are consistent with Grade I diastolic dysfunction (impaired relaxation).  2. Right ventricular systolic function is normal. The right ventricular size is normal.  3. The mitral valve is abnormal. Mild mitral valve regurgitation. No evidence of mitral stenosis.  4. The aortic valve is tricuspid. Aortic valve regurgitation is not  visualized. No aortic stenosis is present.  5. The inferior vena cava is normal in size with greater than 50% respiratory variability, suggesting right atrial pressure of 3 mmHg. FINDINGS  Left Ventricle: Small  area of distal septal/inferior basal hypokinesis. Left ventricular ejection fraction, by estimation, is 55%. The left ventricle has normal function. The left ventricle has no regional wall motion abnormalities. The left ventricular  internal cavity size was normal in size. There is mild left ventricular hypertrophy. Left ventricular diastolic parameters are consistent with Grade I diastolic dysfunction (impaired relaxation). Right Ventricle: The right ventricular size is normal. No increase in right ventricular wall thickness. Right ventricular systolic function is normal. Left Atrium: Left atrial size was normal in size. Right Atrium: Right atrial size was normal in size. Pericardium: There is no evidence of pericardial effusion. Mitral Valve: The mitral valve is abnormal. There is mild thickening of the mitral valve leaflet(s). Mild mitral annular calcification. Mild mitral valve regurgitation. No evidence of mitral valve stenosis. Tricuspid Valve: The tricuspid valve is normal in structure. Tricuspid valve regurgitation is not demonstrated. No evidence of tricuspid stenosis. Aortic Valve: The aortic valve is tricuspid. Aortic valve regurgitation is not visualized. No aortic stenosis is present. Pulmonic Valve: The pulmonic valve was normal in structure. Pulmonic valve regurgitation is not visualized. No evidence of pulmonic stenosis. Aorta: The aortic root is normal in size and structure. Venous: The inferior vena cava is normal in size with greater than 50% respiratory variability, suggesting right atrial pressure of 3 mmHg. IAS/Shunts: No atrial level shunt detected by color flow Doppler.  LEFT VENTRICLE PLAX 2D LVIDd:         4.60 cm     Diastology LVIDs:         3.40 cm     LV e' medial:  5.33 cm/s LV PW:         1.10 cm     LV e' lateral: 7.51 cm/s LV IVS:        1.20 cm  LV Volumes (MOD) LV vol d, MOD A2C: 82.3 ml LV vol s, MOD A2C: 35.6 ml LV SV MOD A2C:     46.7 ml IVC IVC diam: 1.30 cm LEFT ATRIUM          Index LA diam:    3.60 cm 1.62 cm/m  AORTIC VALVE LVOT Vmax:   111.00 cm/s LVOT Vmean:  73.900 cm/s LVOT VTI:    0.207 m  AORTA Ao Asc diam: 3.10 cm  SHUNTS Systemic VTI: 0.21 m Charlton Haws MD Electronically signed by Charlton Haws MD Signature Date/Time: 02/18/2022/11:59:28 AM    Final    CARDIAC CATHETERIZATION  Result Date: 02/13/2022   Proximal to distal LAD stents: Prox LAD to Mid LAD lesion is 70% stenosed. Jailed 2nd Diag lesion is 80% stenosed.   Mid LAD-1 lesion is 80% stenosed. Mid LAD-2 lesion is 50% stenosed.  (All ISR).  Dist LAD lesion is 99% stenosed (at distal stent edge)   Prox RCA-1 lesion is 70% stenosed. Prox RCA-2 lesion is 70% stenosed.   Mid RCA-1 lesion is 70% stenosed.  Mid RCA-2 lesion is 95% stenosed (ulcerated flat lesion)   RV Branch lesion is 60% stenosed.   RPDA lesion is 60% stenosed.   LV end diastolic pressure is severely elevated.   There is no aortic valve stenosis. Progression of Severe Multivessel: - 2 potential culprit lesions: distal-mid RCA ulcerated 95% ruptured plaque (mid #2), distal mid LAD stent edge 99% with diffuse disease  elsewhere RCA:-proximal #1 70%, followed by proximal #2 70%, mid #1 1 70% and mid #2 95%, ostial PDA 60% LAD: Long stented area with proximal concentric 70% are, jailed D2 85% Zio, distal stent focal 80% followed by segmental 50% then 99% at distal edge. LCx-small OM1 and OM 2 with bifurcating OM 3, AVG circumflex with small branches Recommendation: Restart IV heparin 2 hours after sheath removal.  CVTS consultation which would mean transferred to [facility].  Hospitalist and cardiology team was notified.  Transfer process has been initiated pending bed He was on IV NTG upon arrival, but this was discontinued and he has been pain-free.  May need to restart. Bryan Lemma, MD  ECHOCARDIOGRAM LIMITED  Result Date: 02/12/2022    ECHOCARDIOGRAM LIMITED REPORT   Patient Name:   TRELLIS VANOVERBEKE Date of Exam: 02/12/2022 Medical Rec #:   [id number]        Height:       72.0 in Accession #:    [id number]       Weight:       210.0 lb Date of Birth:  16-Feb-1976       BSA:          2.175 m Patient Age:    46 years         BP:           137/92 mmHg Patient Gender: M                HR:           85 bpm. Exam Location:  [facility] Procedure: Limited Echo, Cardiac Doppler and Color Doppler Indications:     NSTEMI I21.4  History:         Patient has prior history of Echocardiogram examinations, most                  recent 11/16/2021. CAD; Risk Factors:Hypertension. Ischemic                  cardiomyopathy, tobacco abuse.  Sonographer:     Cristela Blue Referring Phys:  [id number] Debbe Odea Diagnosing Phys: Debbe Odea MD  Sonographer Comments: Image quality was fair. IMPRESSIONS  1. Left ventricular ejection fraction, by estimation, is 60 to 65%. The left ventricle has normal function. The left ventricle has no regional wall motion abnormalities.  2. Right ventricular systolic function is normal. The right ventricular size is normal.  3. The mitral valve is normal in structure. Mild mitral valve regurgitation. FINDINGS  Left Ventricle: Left ventricular ejection fraction, by estimation, is 60 to 65%. The left ventricle has normal function. The left ventricle has no regional wall motion abnormalities. Right Ventricle: The right ventricular size is normal. No increase in right ventricular wall thickness. Right ventricular systolic function is normal. Left Atrium: Left atrial size was normal in size. Right Atrium: Right atrial size was normal in size. Pericardium: There is no evidence of pericardial effusion. Mitral Valve: The mitral valve is normal in structure. Mild mitral valve regurgitation. Pulmonic Valve: The pulmonic valve was not well visualized. Pulmonic valve regurgitation is not visualized. Additional Comments: Spectral Doppler performed. Color Doppler performed.  LEFT VENTRICLE PLAX 2D LVIDd:         4.10 cm LVIDs:         2.50 cm LV PW:          1.30 cm LV IVS:        1.30 cm  Debbe Odea MD Electronically signed  by Debbe Odea MD Signature Date/Time: 02/12/2022/1:56:15 PM    Final    DG Chest Port 1 View  Result Date: 02/11/2022 CLINICAL DATA:  Chest pain. EXAM: PORTABLE CHEST 1 VIEW COMPARISON:  November 16, 2021. FINDINGS: The heart size and mediastinal contours are within normal limits. Both lungs are clear. The visualized skeletal structures are unremarkable. IMPRESSION: No active disease. Electronically Signed   By: Lupita Raider M.D.   On: 02/11/2022 18:27       I have independently reviewed the above radiology studies  and reviewed the findings with the patient.   Recent Lab Findings: Lab Results  Component Value Date   WBC 9.4 02/18/2022   HGB 13.8 02/18/2022   HCT 41.3 02/18/2022   PLT 274 02/18/2022   GLUCOSE 117 (H) 02/18/2022   CHOL 179 11/17/2021   TRIG 284 (H) 11/17/2021   HDL 29 (L) 11/17/2021   LDLCALC 93 11/17/2021   ALT 20 11/16/2021   AST 29 11/16/2021   NA 137 02/18/2022   K 4.5 02/18/2022   CL 107 02/18/2022   CREATININE 1.41 (H) 02/18/2022   BUN 13 02/18/2022   CO2 21 (L) 02/18/2022   INR 1.1 02/11/2022   HGBA1C 5.8 (H) 05/18/2021     Assessment / Plan:   46 year old man with symptomatic 2v CAD. Has recurrent in-stent restenosis in the LAD in the setting of continued cocaine use and poor compliance with antiplatelet medications.  Co-morbidities include polysubstance use and hypertension.  Risks/benefits/alternatives discussed at length 94% standard recovery, 4% morbidity - any organ, 2% mortality. All questions asked and answered. Full CABG procedure and recovery explained.  Plan is LIMA - LAD, SVG -PDA, possible atriclip.     Waverly Ferrari Missie Gehrig 02/18/2022 4:37 PM

## 2022-02-18 NOTE — Progress Notes (Signed)
  Echocardiogram 2D Echocardiogram has been performed.  Delcie Roch 02/18/2022, 11:56 AM

## 2022-02-18 NOTE — Progress Notes (Addendum)
Rounding Note    Patient Name: Logan Crawford Date of Encounter: 02/18/2022  [facility] Cardiologist: Debbe Odea, MD   Subjective   Mild episode of chest pain this morning.   Inpatient Medications    Scheduled Meds:  aspirin EC  81 mg Oral Daily   carvedilol  25 mg Oral BID WC   isosorbide mononitrate  60 mg Oral BID   pantoprazole  40 mg Oral Daily   Continuous Infusions:  heparin 1,800 Units/hr (02/18/22 0626)   PRN Meds: acetaminophen, nitroGLYCERIN, ondansetron (ZOFRAN) IV, mouth rinse   Vital Signs    Vitals:   02/17/22 1500 02/17/22 2029 02/18/22 0032 02/18/22 0601  BP: (!) 104/58 109/69 108/65 (!) 132/91  Pulse: 69 66 67 67  Resp: 18 17 16 18   Temp: 97.8 F (36.6 C) 97.9 F (36.6 C) 97.8 F (36.6 C) 98.3 F (36.8 C)  TempSrc: Oral Oral Oral Oral  SpO2: 98% 98% 96% 98%  Weight: 100.9 kg     Height: 6' (1.829 m)       Intake/Output Summary (Last 24 hours) at 02/18/2022 0729 Last data filed at 02/18/2022 0414 Gross per 24 hour  Intake 716.97 ml  Output --  Net 716.97 ml      02/17/2022    3:00 PM 02/13/2022    1:15 PM 02/11/2022    5:49 PM  Last 3 Weights  Weight (lbs) 222 lb 6.4 oz 210 lb 210 lb  Weight (kg) 100.88 kg 95.255 kg 95.255 kg      Telemetry    Sinus Rhythm, PVCs - Personally Reviewed  ECG    No new tracing  Physical Exam   GEN: No acute distress.   Neck: No JVD Cardiac: RRR, no murmurs, rubs, or gallops.  Respiratory: Clear to auscultation bilaterally. GI: Soft, nontender, non-distended  MS: No edema; No deformity. Neuro:  Nonfocal  Psych: Normal affect   Labs    High Sensitivity Troponin:   Recent Labs  Lab 02/11/22 1800 02/11/22 2018 02/12/22 0855 02/13/22 1915  TROPONINIHS 256* 429* 839* 341*     Chemistry Recent Labs  Lab 02/14/22 0228 02/15/22 0639 02/16/22 0559 02/17/22 0644 02/18/22 0230  NA 138   < > 138 138 137  K 4.1   < > 4.6 4.6 4.5  CL 109   < > 110 106 107  CO2  23   < > 22 23 21*  GLUCOSE 100*   < > 83 95 117*  BUN 15   < > 12 16 13   CREATININE 1.59*   < > 1.29* 1.47* 1.41*  CALCIUM 8.3*   < > 8.9 9.1 8.8*  MG 2.1  --   --   --   --   GFRNONAA 53*   < > >60 58* >60  ANIONGAP 6   < > 6 9 9    < > = values in this interval not displayed.    Lipids No results for input(s): "CHOL", "TRIG", "HDL", "LABVLDL", "LDLCALC", "CHOLHDL" in the last 168 hours.  Hematology Recent Labs  Lab 02/14/22 0228 02/15/22 0646 02/18/22 0230  WBC 6.9 7.5 9.4  RBC 4.64 4.84 4.48  HGB 14.1 14.7 13.8  HCT 40.8 43.3 41.3  MCV 87.9 89.5 92.2  MCH 30.4 30.4 30.8  MCHC 34.6 33.9 33.4  RDW 14.0 14.2 14.1  PLT 286 271 274   Thyroid No results for input(s): "TSH", "FREET4" in the last 168 hours.  BNPNo results for input(s): "BNP", "PROBNP"  in the last 168 hours.  DDimer No results for input(s): "DDIMER" in the last 168 hours.   Radiology    No results found.  Cardiac Studies   Cath: 02/13/2022    Proximal to distal LAD stents: Prox LAD to Mid LAD lesion is 70% stenosed. Jailed 2nd Diag lesion is 80% stenosed.   Mid LAD-1 lesion is 80% stenosed. Mid LAD-2 lesion is 50% stenosed.  (All ISR).  Dist LAD lesion is 99% stenosed (at distal stent edge)   Prox RCA-1 lesion is 70% stenosed. Prox RCA-2 lesion is 70% stenosed.   Mid RCA-1 lesion is 70% stenosed.  Mid RCA-2 lesion is 95% stenosed (ulcerated flat lesion)   RV Branch lesion is 60% stenosed.   RPDA lesion is 60% stenosed.   LV end diastolic pressure is severely elevated.   There is no aortic valve stenosis.   Progression of Severe Multivessel: - 2 potential culprit lesions: distal-mid RCA ulcerated 95% ruptured plaque (mid #2), distal mid LAD stent edge 99% with diffuse disease elsewhere RCA:-proximal #1 70%, followed by proximal #2 70%, mid #1 1 70% and mid #2 95%, ostial PDA 60% LAD: Long stented area with proximal concentric 70% are, jailed D2 85% Zio, distal stent focal 80% followed by segmental 50% then  99% at distal edge. LCx-small OM1 and OM 2 with bifurcating OM 3, AVG circumflex with small branches      Recommendation: Restart IV heparin 2 hours after sheath removal.  CVTS consultation which would mean transferred to [facility].  Hospitalist and cardiology team was notified.  Transfer process has been initiated pending bed   He was on IV NTG upon arrival, but this was discontinued and he has been pain-free.  May need to restart.   Bryan Lemma, MD  Diagnostic Dominance: Co-dominant   Echo: 02/12/2022  IMPRESSIONS     1. Left ventricular ejection fraction, by estimation, is 60 to 65%. The  left ventricle has normal function. The left ventricle has no regional  wall motion abnormalities.   2. Right ventricular systolic function is normal. The right ventricular  size is normal.   3. The mitral valve is normal in structure. Mild mitral valve  regurgitation.   FINDINGS   Left Ventricle: Left ventricular ejection fraction, by estimation, is 60  to 65%. The left ventricle has normal function. The left ventricle has no  regional wall motion abnormalities.   Right Ventricle: The right ventricular size is normal. No increase in  right ventricular wall thickness. Right ventricular systolic function is  normal.   Left Atrium: Left atrial size was normal in size.   Right Atrium: Right atrial size was normal in size.   Pericardium: There is no evidence of pericardial effusion.   Mitral Valve: The mitral valve is normal in structure. Mild mitral valve  regurgitation.   Pulmonic Valve: The pulmonic valve was not well visualized. Pulmonic valve  regurgitation is not visualized.   Additional Comments: Spectral Doppler performed. Color Doppler performed.      Patient Profile     46 y.o. male with history of CAD/PCI x2( mid LAD 2021, 04/2021), hypertension, current smoker, cocaine use presenting with chest pain diagnosed with NSTEMI.   Assessment & Plan     NSTEMI CAD s/p prior PCI -- hsTn peaked at 839, underwent cardiac cath noted above at [facility] with progression of 2 culprit lesions of the RCA and LAD with recommendations to transfer to [facility] for TCTS evaluation. Echo showed LVEF of 55-60%  with no rWMA noted. Last dose of Brilinta 12/21 at 9am. Seen by Dr. Delia Chimes this morning with plans for CABG tomorrow per pt.  -- remains on IV heparin, ASA, coreg, Imdur   HTN -- blood pressure well controlled -- continue coreg, Imdur   HLD -- continue atorvastatin 80mg  daily  AKI -- baseline Cr 1.1, 1.3>>1.4 this admission  Polysubstance abuse -- reported cocaine last use 1-2 months ago -- UDS + cocaine on admission  For questions or updates, please contact [facility] Please consult [email or web address] for contact info under        Signed, , NP  02/18/2022, 7:29 AM    I have personally seen and examined this patient. I agree with the assessment and plan as outlined above.  Pt found to have severe 2V CAD by cath at [facility]. Transferred to [facility] for CT surgery consult for CABG. He has been seen by Dr. [facility] today per pt. No formal consult note in chart yet. Potential CABG tomorrow per pt. Continue ASA, statin, beta blocker, Imdur. Continue IV heparin while awaiting bypass surgery.  Delia Chimes, MD, [facility] 02/18/2022 9:22 AM

## 2022-02-19 ENCOUNTER — Encounter (HOSPITAL_COMMUNITY): Payer: Self-pay | Admitting: Cardiology

## 2022-02-19 ENCOUNTER — Inpatient Hospital Stay (HOSPITAL_COMMUNITY): Payer: Medicaid Other | Admitting: Anesthesiology

## 2022-02-19 ENCOUNTER — Inpatient Hospital Stay (HOSPITAL_COMMUNITY): Payer: Medicaid Other

## 2022-02-19 ENCOUNTER — Inpatient Hospital Stay (HOSPITAL_COMMUNITY)
Disposition: A | Discharge status: Home / Self Care | Payer: Self-pay | Source: Home / Self Care | Attending: Cardiothoracic Surgery

## 2022-02-19 ENCOUNTER — Other Ambulatory Visit: Payer: Self-pay

## 2022-02-19 DIAGNOSIS — I252 Old myocardial infarction: Secondary | ICD-10-CM

## 2022-02-19 DIAGNOSIS — I25119 Atherosclerotic heart disease of native coronary artery with unspecified angina pectoris: Secondary | ICD-10-CM

## 2022-02-19 DIAGNOSIS — I255 Ischemic cardiomyopathy: Secondary | ICD-10-CM

## 2022-02-19 DIAGNOSIS — I2510 Atherosclerotic heart disease of native coronary artery without angina pectoris: Secondary | ICD-10-CM

## 2022-02-19 DIAGNOSIS — Z87891 Personal history of nicotine dependence: Secondary | ICD-10-CM

## 2022-02-19 DIAGNOSIS — I214 Non-ST elevation (NSTEMI) myocardial infarction: Secondary | ICD-10-CM

## 2022-02-19 DIAGNOSIS — T8111XA Postprocedural  cardiogenic shock, initial encounter: Secondary | ICD-10-CM

## 2022-02-19 DIAGNOSIS — I509 Heart failure, unspecified: Secondary | ICD-10-CM

## 2022-02-19 DIAGNOSIS — Z951 Presence of aortocoronary bypass graft: Secondary | ICD-10-CM

## 2022-02-19 DIAGNOSIS — I110 Hypertensive heart disease with heart failure: Secondary | ICD-10-CM

## 2022-02-19 DIAGNOSIS — I442 Atrioventricular block, complete: Secondary | ICD-10-CM

## 2022-02-19 DIAGNOSIS — T8859XA Other complications of anesthesia, initial encounter: Secondary | ICD-10-CM

## 2022-02-19 DIAGNOSIS — Z9889 Other specified postprocedural states: Secondary | ICD-10-CM

## 2022-02-19 HISTORY — DX: Other complications of anesthesia, initial encounter: T88.59XA

## 2022-02-19 HISTORY — DX: Presence of aortocoronary bypass graft: Z95.1

## 2022-02-19 HISTORY — DX: Postprocedural cardiogenic shock, initial encounter: T81.11XA

## 2022-02-19 HISTORY — PX: CORONARY ARTERY BYPASS GRAFT: SHX141

## 2022-02-19 HISTORY — DX: Other specified postprocedural states: Z98.890

## 2022-02-19 HISTORY — DX: Atrioventricular block, complete: I44.2

## 2022-02-19 HISTORY — PX: LEFT ATRIAL APPENDAGE OCCLUSION: SHX173A

## 2022-02-19 LAB — BLOOD GAS, ARTERIAL
Acid-base deficit: 6.6 mmol/L — ABNORMAL HIGH (ref 0.0–2.0)
Bicarbonate: 19.1 mmol/L — ABNORMAL LOW (ref 20.0–28.0)
O2 Saturation: 99.2 %
Patient temperature: 37
pCO2 arterial: 38 mmHg (ref 32–48)
pH, Arterial: 7.31 — ABNORMAL LOW (ref 7.35–7.45)
pO2, Arterial: 330 mmHg — ABNORMAL HIGH (ref 83–108)

## 2022-02-19 LAB — POCT I-STAT 7, (LYTES, BLD GAS, ICA,H+H)
Acid-base deficit: 7 mmol/L — ABNORMAL HIGH (ref 0.0–2.0)
Acid-base deficit: 5 mmol/L — ABNORMAL HIGH (ref 0.0–2.0)
Acid-base deficit: 4 mmol/L — ABNORMAL HIGH (ref 0.0–2.0)
Acid-base deficit: 4 mmol/L — ABNORMAL HIGH (ref 0.0–2.0)
Acid-base deficit: 3 mmol/L — ABNORMAL HIGH (ref 0.0–2.0)
Acid-base deficit: 5 mmol/L — ABNORMAL HIGH (ref 0.0–2.0)
Acid-base deficit: 6 mmol/L — ABNORMAL HIGH (ref 0.0–2.0)
Bicarbonate: 21.1 mmol/L (ref 20.0–28.0)
Bicarbonate: 21.6 mmol/L (ref 20.0–28.0)
Bicarbonate: 21.2 mmol/L (ref 20.0–28.0)
Bicarbonate: 22.9 mmol/L (ref 20.0–28.0)
Bicarbonate: 20.2 mmol/L (ref 20.0–28.0)
Bicarbonate: 22.2 mmol/L (ref 20.0–28.0)
Bicarbonate: 21.8 mmol/L (ref 20.0–28.0)
Calcium, Ion: 1.23 mmol/L (ref 1.15–1.40)
Calcium, Ion: 0.98 mmol/L — ABNORMAL LOW (ref 1.15–1.40)
Calcium, Ion: 0.99 mmol/L — ABNORMAL LOW (ref 1.15–1.40)
Calcium, Ion: 0.91 mmol/L — ABNORMAL LOW (ref 1.15–1.40)
Calcium, Ion: 0.99 mmol/L — ABNORMAL LOW (ref 1.15–1.40)
Calcium, Ion: 1.05 mmol/L — ABNORMAL LOW (ref 1.15–1.40)
Calcium, Ion: 0.98 mmol/L — ABNORMAL LOW (ref 1.15–1.40)
HCT: 39 % (ref 39.0–52.0)
HCT: 24 % — ABNORMAL LOW (ref 39.0–52.0)
HCT: 26 % — ABNORMAL LOW (ref 39.0–52.0)
HCT: 27 % — ABNORMAL LOW (ref 39.0–52.0)
HCT: 23 % — ABNORMAL LOW (ref 39.0–52.0)
HCT: 23 % — ABNORMAL LOW (ref 39.0–52.0)
HCT: 28 % — ABNORMAL LOW (ref 39.0–52.0)
Hemoglobin: 13.3 g/dL (ref 13.0–17.0)
Hemoglobin: 8.2 g/dL — ABNORMAL LOW (ref 13.0–17.0)
Hemoglobin: 8.8 g/dL — ABNORMAL LOW (ref 13.0–17.0)
Hemoglobin: 9.2 g/dL — ABNORMAL LOW (ref 13.0–17.0)
Hemoglobin: 7.8 g/dL — ABNORMAL LOW (ref 13.0–17.0)
Hemoglobin: 7.8 g/dL — ABNORMAL LOW (ref 13.0–17.0)
Hemoglobin: 9.5 g/dL — ABNORMAL LOW (ref 13.0–17.0)
O2 Saturation: 100 %
O2 Saturation: 100 %
O2 Saturation: 100 %
O2 Saturation: 100 %
O2 Saturation: 100 %
O2 Saturation: 100 %
O2 Saturation: 98 %
Potassium: 4.5 mmol/L (ref 3.5–5.1)
Potassium: 7.8 mmol/L (ref 3.5–5.1)
Potassium: 7.3 mmol/L (ref 3.5–5.1)
Potassium: 7.1 mmol/L (ref 3.5–5.1)
Potassium: 5.3 mmol/L — ABNORMAL HIGH (ref 3.5–5.1)
Potassium: 6.3 mmol/L (ref 3.5–5.1)
Potassium: 5 mmol/L (ref 3.5–5.1)
Sodium: 141 mmol/L (ref 135–145)
Sodium: 135 mmol/L (ref 135–145)
Sodium: 133 mmol/L — ABNORMAL LOW (ref 135–145)
Sodium: 133 mmol/L — ABNORMAL LOW (ref 135–145)
Sodium: 135 mmol/L (ref 135–145)
Sodium: 137 mmol/L (ref 135–145)
Sodium: 140 mmol/L (ref 135–145)
TCO2: 23 mmol/L (ref 22–32)
TCO2: 23 mmol/L (ref 22–32)
TCO2: 22 mmol/L (ref 22–32)
TCO2: 24 mmol/L (ref 22–32)
TCO2: 21 mmol/L — ABNORMAL LOW (ref 22–32)
TCO2: 23 mmol/L (ref 22–32)
TCO2: 23 mmol/L (ref 22–32)
pCO2 arterial: 50.2 mmHg — ABNORMAL HIGH (ref 32–48)
pCO2 arterial: 48.6 mmHg — ABNORMAL HIGH (ref 32–48)
pCO2 arterial: 38.9 mmHg (ref 32–48)
pCO2 arterial: 49.6 mmHg — ABNORMAL HIGH (ref 32–48)
pCO2 arterial: 38.3 mmHg (ref 32–48)
pCO2 arterial: 39.4 mmHg (ref 32–48)
pCO2 arterial: 56 mmHg — ABNORMAL HIGH (ref 32–48)
pH, Arterial: 7.232 — ABNORMAL LOW (ref 7.35–7.45)
pH, Arterial: 7.256 — ABNORMAL LOW (ref 7.35–7.45)
pH, Arterial: 7.346 — ABNORMAL LOW (ref 7.35–7.45)
pH, Arterial: 7.273 — ABNORMAL LOW (ref 7.35–7.45)
pH, Arterial: 7.33 — ABNORMAL LOW (ref 7.35–7.45)
pH, Arterial: 7.359 (ref 7.35–7.45)
pH, Arterial: 7.198 — CL (ref 7.35–7.45)
pO2, Arterial: 492 mmHg — ABNORMAL HIGH (ref 83–108)
pO2, Arterial: 235 mmHg — ABNORMAL HIGH (ref 83–108)
pO2, Arterial: 332 mmHg — ABNORMAL HIGH (ref 83–108)
pO2, Arterial: 255 mmHg — ABNORMAL HIGH (ref 83–108)
pO2, Arterial: 346 mmHg — ABNORMAL HIGH (ref 83–108)
pO2, Arterial: 373 mmHg — ABNORMAL HIGH (ref 83–108)
pO2, Arterial: 138 mmHg — ABNORMAL HIGH (ref 83–108)

## 2022-02-19 LAB — POCT I-STAT, CHEM 8
BUN: 13 mg/dL (ref 6–20)
BUN: 13 mg/dL (ref 6–20)
BUN: 14 mg/dL (ref 6–20)
BUN: 13 mg/dL (ref 6–20)
BUN: 13 mg/dL (ref 6–20)
BUN: 14 mg/dL (ref 6–20)
BUN: 14 mg/dL (ref 6–20)
BUN: 14 mg/dL (ref 6–20)
BUN: 13 mg/dL (ref 6–20)
BUN: 14 mg/dL (ref 6–20)
Calcium, Ion: 1.22 mmol/L (ref 1.15–1.40)
Calcium, Ion: 1.24 mmol/L (ref 1.15–1.40)
Calcium, Ion: 1.23 mmol/L (ref 1.15–1.40)
Calcium, Ion: 0.93 mmol/L — ABNORMAL LOW (ref 1.15–1.40)
Calcium, Ion: 0.85 mmol/L — CL (ref 1.15–1.40)
Calcium, Ion: 0.95 mmol/L — ABNORMAL LOW (ref 1.15–1.40)
Calcium, Ion: 1.02 mmol/L — ABNORMAL LOW (ref 1.15–1.40)
Calcium, Ion: 1.07 mmol/L — ABNORMAL LOW (ref 1.15–1.40)
Calcium, Ion: 0.98 mmol/L — ABNORMAL LOW (ref 1.15–1.40)
Calcium, Ion: 0.98 mmol/L — ABNORMAL LOW (ref 1.15–1.40)
Chloride: 106 mmol/L (ref 98–111)
Chloride: 106 mmol/L (ref 98–111)
Chloride: 106 mmol/L (ref 98–111)
Chloride: 104 mmol/L (ref 98–111)
Chloride: 100 mmol/L (ref 98–111)
Chloride: 99 mmol/L (ref 98–111)
Chloride: 101 mmol/L (ref 98–111)
Chloride: 102 mmol/L (ref 98–111)
Chloride: 107 mmol/L (ref 98–111)
Chloride: 106 mmol/L (ref 98–111)
Creatinine, Ser: 1.2 mg/dL (ref 0.61–1.24)
Creatinine, Ser: 1.5 mg/dL — ABNORMAL HIGH (ref 0.61–1.24)
Creatinine, Ser: 1.6 mg/dL — ABNORMAL HIGH (ref 0.61–1.24)
Creatinine, Ser: 1.6 mg/dL — ABNORMAL HIGH (ref 0.61–1.24)
Creatinine, Ser: 1.6 mg/dL — ABNORMAL HIGH (ref 0.61–1.24)
Creatinine, Ser: 1.7 mg/dL — ABNORMAL HIGH (ref 0.61–1.24)
Creatinine, Ser: 1.6 mg/dL — ABNORMAL HIGH (ref 0.61–1.24)
Creatinine, Ser: 1.7 mg/dL — ABNORMAL HIGH (ref 0.61–1.24)
Creatinine, Ser: 1.7 mg/dL — ABNORMAL HIGH (ref 0.61–1.24)
Creatinine, Ser: 1.6 mg/dL — ABNORMAL HIGH (ref 0.61–1.24)
Glucose, Bld: 93 mg/dL (ref 70–99)
Glucose, Bld: 105 mg/dL — ABNORMAL HIGH (ref 70–99)
Glucose, Bld: 84 mg/dL (ref 70–99)
Glucose, Bld: 98 mg/dL (ref 70–99)
Glucose, Bld: 92 mg/dL (ref 70–99)
Glucose, Bld: 98 mg/dL (ref 70–99)
Glucose, Bld: 163 mg/dL — ABNORMAL HIGH (ref 70–99)
Glucose, Bld: 199 mg/dL — ABNORMAL HIGH (ref 70–99)
Glucose, Bld: 161 mg/dL — ABNORMAL HIGH (ref 70–99)
Glucose, Bld: 134 mg/dL — ABNORMAL HIGH (ref 70–99)
HCT: 42 % (ref 39.0–52.0)
HCT: 39 % (ref 39.0–52.0)
HCT: 40 % (ref 39.0–52.0)
HCT: 26 % — ABNORMAL LOW (ref 39.0–52.0)
HCT: 26 % — ABNORMAL LOW (ref 39.0–52.0)
HCT: 28 % — ABNORMAL LOW (ref 39.0–52.0)
HCT: 24 % — ABNORMAL LOW (ref 39.0–52.0)
HCT: 25 % — ABNORMAL LOW (ref 39.0–52.0)
HCT: 23 % — ABNORMAL LOW (ref 39.0–52.0)
HCT: 29 % — ABNORMAL LOW (ref 39.0–52.0)
Hemoglobin: 14.3 g/dL (ref 13.0–17.0)
Hemoglobin: 13.3 g/dL (ref 13.0–17.0)
Hemoglobin: 13.6 g/dL (ref 13.0–17.0)
Hemoglobin: 8.8 g/dL — ABNORMAL LOW (ref 13.0–17.0)
Hemoglobin: 8.8 g/dL — ABNORMAL LOW (ref 13.0–17.0)
Hemoglobin: 9.5 g/dL — ABNORMAL LOW (ref 13.0–17.0)
Hemoglobin: 8.2 g/dL — ABNORMAL LOW (ref 13.0–17.0)
Hemoglobin: 8.5 g/dL — ABNORMAL LOW (ref 13.0–17.0)
Hemoglobin: 7.8 g/dL — ABNORMAL LOW (ref 13.0–17.0)
Hemoglobin: 9.9 g/dL — ABNORMAL LOW (ref 13.0–17.0)
Potassium: 4.7 mmol/L (ref 3.5–5.1)
Potassium: 5.4 mmol/L — ABNORMAL HIGH (ref 3.5–5.1)
Potassium: 6.5 mmol/L (ref 3.5–5.1)
Potassium: 7.3 mmol/L (ref 3.5–5.1)
Potassium: 7.1 mmol/L (ref 3.5–5.1)
Potassium: 7.2 mmol/L (ref 3.5–5.1)
Potassium: 6.2 mmol/L — ABNORMAL HIGH (ref 3.5–5.1)
Potassium: 6.9 mmol/L (ref 3.5–5.1)
Potassium: 5.5 mmol/L — ABNORMAL HIGH (ref 3.5–5.1)
Potassium: 5 mmol/L (ref 3.5–5.1)
Sodium: 140 mmol/L (ref 135–145)
Sodium: 137 mmol/L (ref 135–145)
Sodium: 136 mmol/L (ref 135–145)
Sodium: 135 mmol/L (ref 135–145)
Sodium: 131 mmol/L — ABNORMAL LOW (ref 135–145)
Sodium: 132 mmol/L — ABNORMAL LOW (ref 135–145)
Sodium: 133 mmol/L — ABNORMAL LOW (ref 135–145)
Sodium: 134 mmol/L — ABNORMAL LOW (ref 135–145)
Sodium: 138 mmol/L (ref 135–145)
Sodium: 140 mmol/L (ref 135–145)
TCO2: 24 mmol/L (ref 22–32)
TCO2: 23 mmol/L (ref 22–32)
TCO2: 23 mmol/L (ref 22–32)
TCO2: 24 mmol/L (ref 22–32)
TCO2: 22 mmol/L (ref 22–32)
TCO2: 23 mmol/L (ref 22–32)
TCO2: 23 mmol/L (ref 22–32)
TCO2: 25 mmol/L (ref 22–32)
TCO2: 21 mmol/L — ABNORMAL LOW (ref 22–32)
TCO2: 22 mmol/L (ref 22–32)

## 2022-02-19 LAB — POCT I-STAT EG7
Acid-base deficit: 5 mmol/L — ABNORMAL HIGH (ref 0.0–2.0)
Bicarbonate: 23.1 mmol/L (ref 20.0–28.0)
Calcium, Ion: 1.04 mmol/L — ABNORMAL LOW (ref 1.15–1.40)
HCT: 28 % — ABNORMAL LOW (ref 39.0–52.0)
Hemoglobin: 9.5 g/dL — ABNORMAL LOW (ref 13.0–17.0)
O2 Saturation: 71 %
Potassium: 7.5 mmol/L (ref 3.5–5.1)
Sodium: 135 mmol/L (ref 135–145)
TCO2: 25 mmol/L (ref 22–32)
pCO2, Ven: 57.1 mmHg (ref 44–60)
pH, Ven: 7.216 — ABNORMAL LOW (ref 7.25–7.43)
pO2, Ven: 46 mmHg — ABNORMAL HIGH (ref 32–45)

## 2022-02-19 LAB — BASIC METABOLIC PANEL
Anion gap: 9 (ref 5–15)
Anion gap: 7 (ref 5–15)
BUN: 13 mg/dL (ref 6–20)
BUN: 13 mg/dL (ref 6–20)
CO2: 22 mmol/L (ref 22–32)
CO2: 21 mmol/L — ABNORMAL LOW (ref 22–32)
Calcium: 9 mg/dL (ref 8.9–10.3)
Calcium: 6 mg/dL — CL (ref 8.9–10.3)
Chloride: 105 mmol/L (ref 98–111)
Chloride: 111 mmol/L (ref 98–111)
Creatinine, Ser: 1.34 mg/dL — ABNORMAL HIGH (ref 0.61–1.24)
Creatinine, Ser: 1.84 mg/dL — ABNORMAL HIGH (ref 0.61–1.24)
GFR, Estimated: >60 mL/min (ref >60)
GFR, Estimated: 45 mL/min — ABNORMAL LOW (ref >60)
Glucose, Bld: 105 mg/dL — ABNORMAL HIGH (ref 70–99)
Glucose, Bld: 187 mg/dL — ABNORMAL HIGH (ref 70–99)
Potassium: 4.6 mmol/L (ref 3.5–5.1)
Potassium: 5.7 mmol/L — ABNORMAL HIGH (ref 3.5–5.1)
Sodium: 136 mmol/L (ref 135–145)
Sodium: 139 mmol/L (ref 135–145)

## 2022-02-19 LAB — LACTIC ACID, PLASMA
Lactic Acid, Venous: 1.9 mmol/L (ref 0.5–1.9)
Lactic Acid, Venous: 2.1 mmol/L (ref 0.5–1.9)

## 2022-02-19 LAB — GLUCOSE, CAPILLARY
Glucose-Capillary: 124 mg/dL — ABNORMAL HIGH (ref 70–99)
Glucose-Capillary: 134 mg/dL — ABNORMAL HIGH (ref 70–99)
Glucose-Capillary: 137 mg/dL — ABNORMAL HIGH (ref 70–99)
Glucose-Capillary: 148 mg/dL — ABNORMAL HIGH (ref 70–99)
Glucose-Capillary: 204 mg/dL — ABNORMAL HIGH (ref 70–99)

## 2022-02-19 LAB — ECHO INTRAOPERATIVE TEE
Height: 72 in
Weight: 3558.4 oz

## 2022-02-19 LAB — CBC
HCT: 43.9 % (ref 39.0–52.0)
HCT: 33.4 % — ABNORMAL LOW (ref 39.0–52.0)
Hemoglobin: 15.3 g/dL (ref 13.0–17.0)
Hemoglobin: 11.3 g/dL — ABNORMAL LOW (ref 13.0–17.0)
MCH: 31.2 pg (ref 26.0–34.0)
MCH: 30.9 pg (ref 26.0–34.0)
MCHC: 34.9 g/dL (ref 30.0–36.0)
MCHC: 33.8 g/dL (ref 30.0–36.0)
MCV: 89.6 fL (ref 80.0–100.0)
MCV: 91.3 fL (ref 80.0–100.0)
Platelets: 263 10*3/uL (ref 150–400)
Platelets: 166 10*3/uL (ref 150–400)
RBC: 4.9 MIL/uL (ref 4.22–5.81)
RBC: 3.66 MIL/uL — ABNORMAL LOW (ref 4.22–5.81)
RDW: 14 % (ref 11.5–15.5)
RDW: 14.7 % (ref 11.5–15.5)
WBC: 9.3 10*3/uL (ref 4.0–10.5)
WBC: 17.4 10*3/uL — ABNORMAL HIGH (ref 4.0–10.5)
nRBC: 0 % (ref 0.0–0.2)
nRBC: 0 % (ref 0.0–0.2)

## 2022-02-19 LAB — HEMOGLOBIN AND HEMATOCRIT, BLOOD
HCT: 26.7 % — ABNORMAL LOW (ref 39.0–52.0)
Hemoglobin: 9.2 g/dL — ABNORMAL LOW (ref 13.0–17.0)

## 2022-02-19 LAB — LACTATE DEHYDROGENASE: LDH: 390 U/L — ABNORMAL HIGH (ref 98–192)

## 2022-02-19 LAB — SARS CORONAVIRUS 2 (TAT 6-24 HRS): SARS Coronavirus 2: NEGATIVE

## 2022-02-19 LAB — COOXEMETRY PANEL
Carboxyhemoglobin: 1.4 % (ref 0.5–1.5)
Methemoglobin: <0.7 % (ref 0.0–1.5)
O2 Saturation: 57.4 %
Total hemoglobin: 10.9 g/dL — ABNORMAL LOW (ref 12.0–16.0)

## 2022-02-19 LAB — APTT: aPTT: 37 seconds — ABNORMAL HIGH (ref 24–36)

## 2022-02-19 LAB — PREPARE RBC (CROSSMATCH)

## 2022-02-19 LAB — PROTIME-INR
INR: 1.3 — ABNORMAL HIGH (ref 0.8–1.2)
Prothrombin Time: 15.9 seconds — ABNORMAL HIGH (ref 11.4–15.2)

## 2022-02-19 LAB — PLATELET COUNT: Platelets: 172 10*3/uL (ref 150–400)

## 2022-02-19 LAB — HEPARIN LEVEL (UNFRACTIONATED): Heparin Unfractionated: 0.65 IU/mL (ref 0.30–0.70)

## 2022-02-19 LAB — CK: Total CK: 812 U/L — ABNORMAL HIGH (ref 49–397)

## 2022-02-19 SURGERY — CORONARY ARTERY BYPASS GRAFTING (CABG)
Anesthesia: General | Site: Chest

## 2022-02-19 MED ORDER — EPINEPHRINE HCL 5 MG/250ML IV SOLN IN NS
0.0000 ug/min | INTRAVENOUS | Status: AC
  Administered 2022-02-19: 4 ug/min via INTRAVENOUS
  Filled 2022-02-19: qty 250

## 2022-02-19 MED ORDER — HEPARIN 30,000 UNITS/1000 ML (OHS) CELLSAVER SOLUTION
Status: DC
  Filled 2022-02-19: qty 1000

## 2022-02-19 MED ORDER — ACETAMINOPHEN 160 MG/5ML PO SOLN
650.0000 mg | Freq: Once | ORAL | Status: AC
  Filled 2022-02-19: qty 20.3

## 2022-02-19 MED ORDER — PHENYLEPHRINE 80 MCG/ML (10ML) SYRINGE FOR IV PUSH (FOR BLOOD PRESSURE SUPPORT)
PREFILLED_SYRINGE | INTRAVENOUS | Status: AC
  Filled 2022-02-19: qty 10

## 2022-02-19 MED ORDER — CEFAZOLIN SODIUM-DEXTROSE 2-4 GM/100ML-% IV SOLN: 2.0000 g | INTRAVENOUS | Status: DC

## 2022-02-19 MED ORDER — ROCURONIUM BROMIDE 10 MG/ML (PF) SYRINGE
PREFILLED_SYRINGE | INTRAVENOUS | Status: DC | PRN
  Administered 2022-02-19: 50 mg via INTRAVENOUS
  Administered 2022-02-19: 100 mg via INTRAVENOUS
  Administered 2022-02-19 (×3): 50 mg via INTRAVENOUS

## 2022-02-19 MED ORDER — VANCOMYCIN HCL 1500 MG/300ML IV SOLN: 1500.0000 mg | INTRAVENOUS | Status: DC

## 2022-02-19 MED ORDER — SODIUM CHLORIDE 0.9% FLUSH
3.0000 mL | Freq: Two times a day (BID) | INTRAVENOUS | Status: DC
  Administered 2022-02-20 – 2022-02-26 (×13): 3 mL via INTRAVENOUS

## 2022-02-19 MED ORDER — TRANEXAMIC ACID (OHS) BOLUS VIA INFUSION: 15.0000 mg/kg | INTRAVENOUS | Status: DC

## 2022-02-19 MED ORDER — METOPROLOL TARTRATE 25 MG/10 ML ORAL SUSPENSION: 12.5000 mg | Freq: Two times a day (BID) | ORAL | Status: DC

## 2022-02-19 MED ORDER — CHLORHEXIDINE GLUCONATE 0.12 % MT SOLN
15.0000 mL | OROMUCOSAL | Status: AC
  Administered 2022-02-19: 15 mL via OROMUCOSAL
  Filled 2022-02-19: qty 15

## 2022-02-19 MED ORDER — HEPARIN SODIUM (PORCINE) 1000 UNIT/ML IJ SOLN
INTRAMUSCULAR | Status: AC
  Filled 2022-02-19: qty 1

## 2022-02-19 MED ORDER — CALCIUM GLUCONATE-NACL 1-0.675 GM/50ML-% IV SOLN
1.0000 g | Freq: Once | INTRAVENOUS | Status: AC
  Administered 2022-02-19: 1000 mg via INTRAVENOUS
  Filled 2022-02-19: qty 50

## 2022-02-19 MED ORDER — ROCURONIUM BROMIDE 10 MG/ML (PF) SYRINGE
PREFILLED_SYRINGE | INTRAVENOUS | Status: AC
  Filled 2022-02-19: qty 10

## 2022-02-19 MED ORDER — POTASSIUM CHLORIDE 2 MEQ/ML IV SOLN
80.0000 meq | INTRAVENOUS | Status: DC
  Filled 2022-02-19: qty 40

## 2022-02-19 MED ORDER — FENTANYL 2500MCG IN NS 250ML (10MCG/ML) PREMIX INFUSION
50.0000 ug/h | INTRAVENOUS | Status: DC
  Administered 2022-02-20: 125 ug/h via INTRAVENOUS
  Administered 2022-02-21: 200 ug/h via INTRAVENOUS
  Administered 2022-02-21: 175 ug/h via INTRAVENOUS
  Filled 2022-02-19 (×3): qty 250

## 2022-02-19 MED ORDER — BUPIVACAINE LIPOSOME 1.3 % IJ SUSP
INTRAMUSCULAR | Status: AC
  Filled 2022-02-19: qty 20

## 2022-02-19 MED ORDER — METOPROLOL TARTRATE 5 MG/5ML IV SOLN: 2.5000 mg | INTRAVENOUS | Status: DC | PRN

## 2022-02-19 MED ORDER — FENTANYL CITRATE (PF) 250 MCG/5ML IJ SOLN
INTRAMUSCULAR | Status: AC
  Filled 2022-02-19: qty 5

## 2022-02-19 MED ORDER — PHENYLEPHRINE HCL-NACL 20-0.9 MG/250ML-% IV SOLN
30.0000 ug/min | INTRAVENOUS | Status: AC
  Administered 2022-02-19: 40 ug/min via INTRAVENOUS
  Filled 2022-02-19: qty 250

## 2022-02-19 MED ORDER — SODIUM CHLORIDE 0.45 % IV SOLN: INTRAVENOUS | Status: DC | PRN

## 2022-02-19 MED ORDER — LACTATED RINGERS IV SOLN: INTRAVENOUS | Status: DC | PRN

## 2022-02-19 MED ORDER — CEFAZOLIN SODIUM-DEXTROSE 2-4 GM/100ML-% IV SOLN
2.0000 g | INTRAVENOUS | Status: AC
  Administered 2022-02-19 (×2): 2 g via INTRAVENOUS
  Filled 2022-02-19: qty 100

## 2022-02-19 MED ORDER — MORPHINE SULFATE (PF) 2 MG/ML IV SOLN
1.0000 mg | INTRAVENOUS | Status: DC | PRN
  Administered 2022-02-21: 4 mg via INTRAVENOUS
  Filled 2022-02-19: qty 2

## 2022-02-19 MED ORDER — DEXTROSE 50 % IV SOLN
0.0000 mL | INTRAVENOUS | Status: DC | PRN
  Filled 2022-02-19: qty 50

## 2022-02-19 MED ORDER — MAGNESIUM SULFATE 4 GM/100ML IV SOLN
4.0000 g | Freq: Once | INTRAVENOUS | Status: AC
  Administered 2022-02-19: 4 g via INTRAVENOUS
  Filled 2022-02-19: qty 100

## 2022-02-19 MED ORDER — ALBUTEROL SULFATE HFA 108 (90 BASE) MCG/ACT IN AERS
INHALATION_SPRAY | RESPIRATORY_TRACT | Status: DC | PRN
  Administered 2022-02-19 (×2): 2 via RESPIRATORY_TRACT

## 2022-02-19 MED ORDER — HEMOSTATIC AGENTS (NO CHARGE) OPTIME
TOPICAL | Status: DC | PRN
  Administered 2022-02-19 (×2): 1 via TOPICAL

## 2022-02-19 MED ORDER — DEXMEDETOMIDINE HCL IN NACL 400 MCG/100ML IV SOLN: 0.0000 ug/kg/h | INTRAVENOUS | Status: DC

## 2022-02-19 MED ORDER — PHENYLEPHRINE 80 MCG/ML (10ML) SYRINGE FOR IV PUSH (FOR BLOOD PRESSURE SUPPORT)
PREFILLED_SYRINGE | INTRAVENOUS | Status: AC
  Filled 2022-02-19: qty 20

## 2022-02-19 MED ORDER — BISACODYL 5 MG PO TBEC
10.0000 mg | DELAYED_RELEASE_TABLET | Freq: Every day | ORAL | Status: DC
  Administered 2022-02-22 – 2022-02-26 (×5): 10 mg via ORAL
  Filled 2022-02-19 (×5): qty 2

## 2022-02-19 MED ORDER — LACTATED RINGERS IV SOLN: INTRAVENOUS | Status: DC

## 2022-02-19 MED ORDER — VANCOMYCIN HCL 1250 MG/250ML IV SOLN
1250.0000 mg | INTRAVENOUS | Status: AC
  Administered 2022-02-19: 1250 mg via INTRAVENOUS
  Filled 2022-02-19 (×2): qty 250

## 2022-02-19 MED ORDER — POTASSIUM CHLORIDE 10 MEQ/50ML IV SOLN: 10.0000 meq | INTRAVENOUS | Status: AC

## 2022-02-19 MED ORDER — MIDAZOLAM HCL 2 MG/2ML IJ SOLN
2.0000 mg | INTRAMUSCULAR | Status: DC | PRN
  Administered 2022-02-21 (×3): 2 mg via INTRAVENOUS
  Filled 2022-02-19 (×3): qty 2

## 2022-02-19 MED ORDER — PROTAMINE SULFATE 10 MG/ML IV SOLN
INTRAVENOUS | Status: AC
  Filled 2022-02-19: qty 25

## 2022-02-19 MED ORDER — EPINEPHRINE HCL 5 MG/250ML IV SOLN IN NS: 0.0000 ug/min | INTRAVENOUS | Status: DC

## 2022-02-19 MED ORDER — TRANEXAMIC ACID (OHS) PUMP PRIME SOLUTION
2.0000 mg/kg | INTRAVENOUS | Status: DC
  Filled 2022-02-19: qty 2.02

## 2022-02-19 MED ORDER — SODIUM BICARBONATE 8.4 % IV SOLN
50.0000 meq | Freq: Once | INTRAVENOUS | Status: AC
  Administered 2022-02-19: 50 meq via INTRAVENOUS

## 2022-02-19 MED ORDER — TRAMADOL HCL 50 MG PO TABS: 50.0000 mg | ORAL_TABLET | ORAL | Status: DC | PRN

## 2022-02-19 MED ORDER — FUROSEMIDE 10 MG/ML IJ SOLN
20.0000 mg | Freq: Once | INTRAMUSCULAR | Status: AC
  Administered 2022-02-19 (×3): 5 mg via INTRAVENOUS
  Administered 2022-02-19: 10 mg via INTRAVENOUS
  Administered 2022-02-19: 5 mg via INTRAVENOUS

## 2022-02-19 MED ORDER — EPHEDRINE 5 MG/ML INJ
INTRAVENOUS | Status: AC
  Filled 2022-02-19: qty 5

## 2022-02-19 MED ORDER — SODIUM ZIRCONIUM CYCLOSILICATE 10 G PO PACK
10.0000 g | PACK | Freq: Two times a day (BID) | ORAL | Status: DC
  Administered 2022-02-19: 10 g via ORAL
  Filled 2022-02-19 (×2): qty 1

## 2022-02-19 MED ORDER — BISACODYL 10 MG RE SUPP
10.0000 mg | Freq: Every day | RECTAL | Status: DC
  Administered 2022-02-21: 10 mg via RECTAL
  Filled 2022-02-19: qty 1

## 2022-02-19 MED ORDER — FENTANYL BOLUS VIA INFUSION
50.0000 ug | INTRAVENOUS | Status: DC | PRN
  Administered 2022-02-20: 100 ug via INTRAVENOUS
  Administered 2022-02-20: 75 ug via INTRAVENOUS
  Administered 2022-02-20: 100 ug via INTRAVENOUS
  Administered 2022-02-20 (×2): 75 ug via INTRAVENOUS
  Administered 2022-02-20 (×2): 100 ug via INTRAVENOUS
  Administered 2022-02-20: 75 ug via INTRAVENOUS
  Administered 2022-02-20 – 2022-02-21 (×6): 100 ug via INTRAVENOUS
  Administered 2022-02-21: 50 ug via INTRAVENOUS
  Administered 2022-02-21 (×3): 100 ug via INTRAVENOUS

## 2022-02-19 MED ORDER — BUPIVACAINE HCL (PF) 0.5 % IJ SOLN
INTRAMUSCULAR | Status: AC
  Filled 2022-02-19: qty 30

## 2022-02-19 MED ORDER — VANCOMYCIN HCL 1000 MG IV SOLR
INTRAVENOUS | Status: AC
  Administered 2022-02-19: 1000 mL
  Filled 2022-02-19: qty 20

## 2022-02-19 MED ORDER — ALBUMIN HUMAN 5 % IV SOLN: INTRAVENOUS | Status: DC | PRN

## 2022-02-19 MED ORDER — INSULIN ASPART 100 UNIT/ML IV SOLN
20.0000 [IU] | Freq: Once | INTRAVENOUS | Status: AC
  Administered 2022-02-19: 20 [IU] via INTRAVENOUS

## 2022-02-19 MED ORDER — NOREPINEPHRINE 4 MG/250ML-% IV SOLN: 0.0000 ug/min | INTRAVENOUS | Status: DC

## 2022-02-19 MED ORDER — PLASMA-LYTE A IV SOLN: INTRAVENOUS | Status: DC

## 2022-02-19 MED ORDER — FUROSEMIDE 10 MG/ML IJ SOLN
10.0000 mg | Freq: Once | INTRAMUSCULAR | Status: AC
  Administered 2022-02-19: 10 mg via INTRAVENOUS
  Filled 2022-02-19: qty 2

## 2022-02-19 MED ORDER — BUPIVACAINE LIPOSOME 1.3 % IJ SUSP
INTRAMUSCULAR | Status: DC | PRN
  Administered 2022-02-19: 50 mL

## 2022-02-19 MED ORDER — FENTANYL 2500MCG IN NS 250ML (10MCG/ML) PREMIX INFUSION
INTRAVENOUS | Status: AC
  Administered 2022-02-19: 50 ug/h via INTRAVENOUS
  Filled 2022-02-19: qty 250

## 2022-02-19 MED ORDER — INSULIN REGULAR(HUMAN) IN NACL 100-0.9 UT/100ML-% IV SOLN
INTRAVENOUS | Status: AC
  Administered 2022-02-19: .7 [IU]/h via INTRAVENOUS
  Filled 2022-02-19: qty 100

## 2022-02-19 MED ORDER — FENTANYL CITRATE (PF) 250 MCG/5ML IJ SOLN
INTRAMUSCULAR | Status: DC | PRN
  Administered 2022-02-19: 50 ug via INTRAVENOUS
  Administered 2022-02-19: 200 ug via INTRAVENOUS
  Administered 2022-02-19: 150 ug via INTRAVENOUS
  Administered 2022-02-19: 250 ug via INTRAVENOUS
  Administered 2022-02-19: 150 ug via INTRAVENOUS
  Administered 2022-02-19: 50 ug via INTRAVENOUS

## 2022-02-19 MED ORDER — MILRINONE LACTATE IN DEXTROSE 20-5 MG/100ML-% IV SOLN
0.2500 ug/kg/min | INTRAVENOUS | Status: DC
  Administered 2022-02-19 – 2022-02-20 (×2): 0.25 ug/kg/min via INTRAVENOUS
  Filled 2022-02-19 (×2): qty 100

## 2022-02-19 MED ORDER — AMIODARONE HCL IN DEXTROSE 360-4.14 MG/200ML-% IV SOLN
30.0000 mg/h | INTRAVENOUS | Status: DC
  Administered 2022-02-19 – 2022-02-22 (×6): 30 mg/h via INTRAVENOUS
  Filled 2022-02-19 (×6): qty 200

## 2022-02-19 MED ORDER — DEXTROSE 50 % IV SOLN
INTRAVENOUS | Status: DC | PRN
  Administered 2022-02-19 (×2): 12.5 mL via INTRAVENOUS
  Administered 2022-02-19: 25 mL via INTRAVENOUS

## 2022-02-19 MED ORDER — ARTIFICIAL TEARS OPHTHALMIC OINT
TOPICAL_OINTMENT | OPHTHALMIC | Status: AC
  Filled 2022-02-19: qty 3.5

## 2022-02-19 MED ORDER — METOPROLOL TARTRATE 12.5 MG HALF TABLET: 12.5000 mg | ORAL_TABLET | Freq: Two times a day (BID) | ORAL | Status: DC

## 2022-02-19 MED ORDER — PLASMA-LYTE A IV SOLN
INTRAVENOUS | Status: AC
  Administered 2022-02-19: 500 mL
  Filled 2022-02-19: qty 2.5

## 2022-02-19 MED ORDER — PHENYLEPHRINE HCL-NACL 20-0.9 MG/250ML-% IV SOLN: 0.0000 ug/min | INTRAVENOUS | Status: DC

## 2022-02-19 MED ORDER — MIDAZOLAM HCL (PF) 5 MG/ML IJ SOLN
INTRAMUSCULAR | Status: DC | PRN
  Administered 2022-02-19: 2 mg via INTRAVENOUS
  Administered 2022-02-19 (×2): 1 mg via INTRAVENOUS
  Administered 2022-02-19: 2 mg via INTRAVENOUS
  Administered 2022-02-19 (×2): 1 mg via INTRAVENOUS
  Administered 2022-02-19: 2 mg via INTRAVENOUS

## 2022-02-19 MED ORDER — DEXMEDETOMIDINE HCL IN NACL 400 MCG/100ML IV SOLN: 0.1000 ug/kg/h | INTRAVENOUS | Status: DC

## 2022-02-19 MED ORDER — SODIUM CHLORIDE (PF) 0.9 % IJ SOLN
INTRAMUSCULAR | Status: AC
  Filled 2022-02-19: qty 10

## 2022-02-19 MED ORDER — VANCOMYCIN HCL IN DEXTROSE 1-5 GM/200ML-% IV SOLN
1000.0000 mg | Freq: Once | INTRAVENOUS | Status: AC
  Administered 2022-02-19: 1000 mg via INTRAVENOUS
  Filled 2022-02-19: qty 200

## 2022-02-19 MED ORDER — HEPARIN 30,000 UNITS/1000 ML (OHS) CELLSAVER SOLUTION: Status: DC

## 2022-02-19 MED ORDER — PROPOFOL 10 MG/ML IV BOLUS
INTRAVENOUS | Status: DC | PRN
  Administered 2022-02-19 (×2): 50 mg via INTRAVENOUS

## 2022-02-19 MED ORDER — POLYETHYLENE GLYCOL 3350 17 G PO PACK
17.0000 g | PACK | Freq: Every day | ORAL | Status: DC
  Administered 2022-02-20 – 2022-02-21 (×2): 17 g
  Filled 2022-02-19 (×2): qty 1

## 2022-02-19 MED ORDER — PHENYLEPHRINE HCL-NACL 20-0.9 MG/250ML-% IV SOLN: 30.0000 ug/min | INTRAVENOUS | Status: DC

## 2022-02-19 MED ORDER — ALBUTEROL SULFATE HFA 108 (90 BASE) MCG/ACT IN AERS
INHALATION_SPRAY | RESPIRATORY_TRACT | Status: AC
  Filled 2022-02-19: qty 6.7

## 2022-02-19 MED ORDER — ACETAMINOPHEN 500 MG PO TABS
1000.0000 mg | ORAL_TABLET | Freq: Four times a day (QID) | ORAL | Status: AC
  Administered 2022-02-22 – 2022-02-24 (×11): 1000 mg via ORAL
  Filled 2022-02-19 (×12): qty 2

## 2022-02-19 MED ORDER — LACTATED RINGERS IV SOLN: 500.0000 mL | Freq: Once | INTRAVENOUS | Status: DC | PRN

## 2022-02-19 MED ORDER — CHLORHEXIDINE GLUCONATE CLOTH 2 % EX PADS
6.0000 | MEDICATED_PAD | Freq: Every day | CUTANEOUS | Status: DC
  Administered 2022-02-20 – 2022-02-26 (×7): 6 via TOPICAL

## 2022-02-19 MED ORDER — CEFAZOLIN SODIUM-DEXTROSE 2-4 GM/100ML-% IV SOLN
2.0000 g | Freq: Three times a day (TID) | INTRAVENOUS | Status: AC
  Administered 2022-02-19 – 2022-02-21 (×6): 2 g via INTRAVENOUS
  Filled 2022-02-19 (×6): qty 100

## 2022-02-19 MED ORDER — MANNITOL 20 % IV SOLN
INTRAVENOUS | Status: DC
  Filled 2022-02-19: qty 13

## 2022-02-19 MED ORDER — DEXTROSE 50 % IV SOLN
1.0000 | Freq: Once | INTRAVENOUS | Status: AC
  Administered 2022-02-19: 50 mL via INTRAVENOUS

## 2022-02-19 MED ORDER — CALCIUM CHLORIDE 10 % IV SOLN
INTRAVENOUS | Status: AC
  Filled 2022-02-19: qty 10

## 2022-02-19 MED ORDER — MILRINONE LACTATE IN DEXTROSE 20-5 MG/100ML-% IV SOLN
0.3000 ug/kg/min | INTRAVENOUS | Status: AC
  Administered 2022-02-19: 5045 ug via INTRAVENOUS
  Filled 2022-02-19: qty 100

## 2022-02-19 MED ORDER — TRANEXAMIC ACID (OHS) BOLUS VIA INFUSION
15.0000 mg/kg | INTRAVENOUS | Status: AC
  Administered 2022-02-19: 1513.5 mg via INTRAVENOUS
  Filled 2022-02-19: qty 1514

## 2022-02-19 MED ORDER — TRANEXAMIC ACID (OHS) PUMP PRIME SOLUTION: 2.0000 mg/kg | INTRAVENOUS | Status: DC

## 2022-02-19 MED ORDER — SODIUM BICARBONATE 8.4 % IV SOLN
INTRAVENOUS | Status: AC
  Filled 2022-02-19: qty 50

## 2022-02-19 MED ORDER — HEMOSTATIC AGENTS (NO CHARGE) OPTIME
TOPICAL | Status: DC | PRN
  Administered 2022-02-19: 1 via TOPICAL

## 2022-02-19 MED ORDER — MAGNESIUM SULFATE 50 % IJ SOLN: 40.0000 meq | INTRAMUSCULAR | Status: DC

## 2022-02-19 MED ORDER — SODIUM CHLORIDE 0.9% FLUSH: 3.0000 mL | INTRAVENOUS | Status: DC | PRN

## 2022-02-19 MED ORDER — PANTOPRAZOLE SODIUM 40 MG PO TBEC: 40.0000 mg | DELAYED_RELEASE_TABLET | Freq: Every day | ORAL | Status: DC

## 2022-02-19 MED ORDER — FAMOTIDINE IN NACL 20-0.9 MG/50ML-% IV SOLN
20.0000 mg | Freq: Two times a day (BID) | INTRAVENOUS | Status: AC
  Administered 2022-02-19 (×2): 20 mg via INTRAVENOUS
  Filled 2022-02-19 (×2): qty 50

## 2022-02-19 MED ORDER — 0.9 % SODIUM CHLORIDE (POUR BTL) OPTIME
TOPICAL | Status: DC | PRN
  Administered 2022-02-19: 5000 mL

## 2022-02-19 MED ORDER — FENTANYL CITRATE PF 50 MCG/ML IJ SOSY: 50.0000 ug | PREFILLED_SYRINGE | Freq: Once | INTRAMUSCULAR | Status: DC

## 2022-02-19 MED ORDER — HEPARIN SODIUM (PORCINE) 1000 UNIT/ML IJ SOLN
INTRAMUSCULAR | Status: AC
  Filled 2022-02-19: qty 10

## 2022-02-19 MED ORDER — SODIUM CHLORIDE 0.9 % IV SOLN: 250.0000 mL | INTRAVENOUS | Status: DC

## 2022-02-19 MED ORDER — ALBUMIN HUMAN 5 % IV SOLN
250.0000 mL | INTRAVENOUS | Status: AC | PRN
  Administered 2022-02-19 – 2022-02-20 (×4): 12.5 g via INTRAVENOUS
  Filled 2022-02-19: qty 250

## 2022-02-19 MED ORDER — NITROGLYCERIN IN D5W 200-5 MCG/ML-% IV SOLN: 0.0000 ug/min | INTRAVENOUS | Status: DC

## 2022-02-19 MED ORDER — POTASSIUM CHLORIDE 2 MEQ/ML IV SOLN: 80.0000 meq | INTRAVENOUS | Status: DC

## 2022-02-19 MED ORDER — PROTAMINE SULFATE 10 MG/ML IV SOLN
INTRAVENOUS | Status: DC | PRN
  Administered 2022-02-19: 360 mg via INTRAVENOUS

## 2022-02-19 MED ORDER — INSULIN REGULAR(HUMAN) IN NACL 100-0.9 UT/100ML-% IV SOLN
INTRAVENOUS | Status: DC
  Administered 2022-02-19: 0.6 [IU]/h via INTRAVENOUS

## 2022-02-19 MED ORDER — CALCIUM CHLORIDE 10 % IV SOLN
INTRAVENOUS | Status: DC | PRN
  Administered 2022-02-19: 200 mg via INTRAVENOUS
  Administered 2022-02-19: 300 mg via INTRAVENOUS
  Administered 2022-02-19: 200 mg via INTRAVENOUS
  Administered 2022-02-19: 300 mg via INTRAVENOUS

## 2022-02-19 MED ORDER — ACETAMINOPHEN 650 MG RE SUPP
650.0000 mg | Freq: Once | RECTAL | Status: AC
  Administered 2022-02-19: 650 mg via RECTAL

## 2022-02-19 MED ORDER — TRANEXAMIC ACID 1000 MG/10ML IV SOLN
1.5000 mg/kg/h | INTRAVENOUS | Status: AC
  Administered 2022-02-19: 1.5 mg/kg/h via INTRAVENOUS
  Filled 2022-02-19: qty 25

## 2022-02-19 MED ORDER — SODIUM CHLORIDE 0.9 % IV SOLN: INTRAVENOUS | Status: DC

## 2022-02-19 MED ORDER — MIDAZOLAM HCL (PF) 10 MG/2ML IJ SOLN
INTRAMUSCULAR | Status: AC
  Filled 2022-02-19: qty 2

## 2022-02-19 MED ORDER — ASPIRIN 325 MG PO TBEC
325.0000 mg | DELAYED_RELEASE_TABLET | Freq: Every day | ORAL | Status: DC
  Administered 2022-02-22 – 2022-03-01 (×8): 325 mg via ORAL
  Filled 2022-02-19 (×8): qty 1

## 2022-02-19 MED ORDER — INSULIN ASPART 100 UNIT/ML IJ SOLN
INTRAMUSCULAR | Status: DC | PRN
  Administered 2022-02-19 (×2): 5 [IU] via INTRAVENOUS

## 2022-02-19 MED ORDER — SODIUM CHLORIDE 0.9 % IV SOLN: INTRAVENOUS | Status: DC | PRN

## 2022-02-19 MED ORDER — MIDAZOLAM HCL 2 MG/2ML IJ SOLN
INTRAMUSCULAR | Status: AC
  Filled 2022-02-19: qty 2

## 2022-02-19 MED ORDER — CEFAZOLIN SODIUM-DEXTROSE 2-4 GM/100ML-% IV SOLN
2.0000 g | INTRAVENOUS | Status: DC
  Filled 2022-02-19: qty 100

## 2022-02-19 MED ORDER — DOCUSATE SODIUM 50 MG/5ML PO LIQD
100.0000 mg | Freq: Two times a day (BID) | ORAL | Status: DC
  Administered 2022-02-19 – 2022-02-21 (×4): 100 mg
  Filled 2022-02-19 (×4): qty 10

## 2022-02-19 MED ORDER — OXYCODONE HCL 5 MG PO TABS
5.0000 mg | ORAL_TABLET | ORAL | Status: DC | PRN
  Filled 2022-02-19: qty 2

## 2022-02-19 MED ORDER — ASPIRIN 81 MG PO CHEW
324.0000 mg | CHEWABLE_TABLET | Freq: Every day | ORAL | Status: DC
  Administered 2022-02-20 – 2022-02-21 (×2): 324 mg
  Filled 2022-02-19 (×2): qty 4

## 2022-02-19 MED ORDER — ONDANSETRON HCL 4 MG/2ML IJ SOLN
4.0000 mg | Freq: Four times a day (QID) | INTRAMUSCULAR | Status: DC | PRN
  Administered 2022-02-21 – 2022-02-26 (×8): 4 mg via INTRAVENOUS
  Filled 2022-02-19 (×8): qty 2

## 2022-02-19 MED ORDER — TRANEXAMIC ACID 1000 MG/10ML IV SOLN: 1.5000 mg/kg/h | INTRAVENOUS | Status: DC

## 2022-02-19 MED ORDER — EPHEDRINE SULFATE-NACL 50-0.9 MG/10ML-% IV SOSY
PREFILLED_SYRINGE | INTRAVENOUS | Status: DC | PRN
  Administered 2022-02-19: 15 mg via INTRAVENOUS
  Administered 2022-02-19: 10 mg via INTRAVENOUS

## 2022-02-19 MED ORDER — DEXMEDETOMIDINE HCL IN NACL 400 MCG/100ML IV SOLN
0.1000 ug/kg/h | INTRAVENOUS | Status: AC
  Administered 2022-02-19: .3 ug/kg/h via INTRAVENOUS
  Filled 2022-02-19: qty 100

## 2022-02-19 MED ORDER — HEPARIN SODIUM (PORCINE) 1000 UNIT/ML IJ SOLN
INTRAMUSCULAR | Status: DC | PRN
  Administered 2022-02-19: 5000 [IU] via INTRAVENOUS
  Administered 2022-02-19: 31000 [IU] via INTRAVENOUS

## 2022-02-19 MED ORDER — PROPOFOL 10 MG/ML IV BOLUS
INTRAVENOUS | Status: AC
  Filled 2022-02-19: qty 20

## 2022-02-19 MED ORDER — PHENYLEPHRINE 80 MCG/ML (10ML) SYRINGE FOR IV PUSH (FOR BLOOD PRESSURE SUPPORT)
PREFILLED_SYRINGE | INTRAVENOUS | Status: DC | PRN
  Administered 2022-02-19: 80 ug via INTRAVENOUS
  Administered 2022-02-19 (×2): 240 ug via INTRAVENOUS
  Administered 2022-02-19 (×2): 160 ug via INTRAVENOUS
  Administered 2022-02-19: 240 ug via INTRAVENOUS
  Administered 2022-02-19 (×4): 160 ug via INTRAVENOUS

## 2022-02-19 MED ORDER — EMPTY CONTAINERS FLEXIBLE MISC
250.0000 mg | Freq: Once | Status: AC
  Administered 2022-02-19: 250 mg via INTRAVENOUS
  Filled 2022-02-19: qty 750

## 2022-02-19 MED ORDER — INSULIN REGULAR(HUMAN) IN NACL 100-0.9 UT/100ML-% IV SOLN: INTRAVENOUS | Status: DC

## 2022-02-19 MED ORDER — DOCUSATE SODIUM 100 MG PO CAPS: 200.0000 mg | ORAL_CAPSULE | Freq: Every day | ORAL | Status: DC

## 2022-02-19 MED ORDER — AMIODARONE HCL IN DEXTROSE 360-4.14 MG/200ML-% IV SOLN
60.0000 mg/h | INTRAVENOUS | Status: AC
  Administered 2022-02-19 (×2): 60 mg/h via INTRAVENOUS
  Filled 2022-02-19: qty 200

## 2022-02-19 MED ORDER — ACETAMINOPHEN 160 MG/5ML PO SOLN
1000.0000 mg | Freq: Four times a day (QID) | ORAL | Status: DC
  Administered 2022-02-20 – 2022-02-21 (×8): 1000 mg
  Filled 2022-02-19 (×9): qty 40.6

## 2022-02-19 MED ORDER — DEXTROSE 50 % IV SOLN
INTRAVENOUS | Status: AC
  Filled 2022-02-19: qty 50

## 2022-02-19 MED ORDER — PROTAMINE SULFATE 10 MG/ML IV SOLN
INTRAVENOUS | Status: AC
  Filled 2022-02-19: qty 10

## 2022-02-19 MED ORDER — MILRINONE LACTATE IN DEXTROSE 20-5 MG/100ML-% IV SOLN: 0.3000 ug/kg/min | INTRAVENOUS | Status: DC

## 2022-02-19 MED ORDER — VASOPRESSIN 20 UNITS/100 ML INFUSION FOR SHOCK
INTRAVENOUS | Status: DC | PRN
  Administered 2022-02-19: .04 [IU]/min via INTRAVENOUS

## 2022-02-19 MED ORDER — NOREPINEPHRINE 4 MG/250ML-% IV SOLN
0.0000 ug/min | INTRAVENOUS | Status: AC
  Administered 2022-02-19: 4 ug/min via INTRAVENOUS
  Filled 2022-02-19: qty 250

## 2022-02-19 SURGICAL SUPPLY — 110 items
ADAPTER MULTI PERFUSION 15 (ADAPTER) ×1 IMPLANT
APPLIER CLIP 11 MED OPEN (CLIP) ×1
APPLIER CLIP 9.375 SM OPEN (CLIP) ×4
ATRICLIP EXCLUSION VLAA 45 (Miscellaneous) ×1 IMPLANT
BAG DECANTER FOR FLEXI CONT (MISCELLANEOUS) ×1 IMPLANT
BALLN IABP SENSA PLUS 8F 50CC (BALLOONS) ×1
BALLOON IABP SENS PLUS 8F 50CC (BALLOONS) IMPLANT
BLADE CLIPPER SURG (BLADE) IMPLANT
BLADE MINI 60D BLUE (BLADE) ×1 IMPLANT
BLADE STERNUM SYSTEM 6 (BLADE) ×1 IMPLANT
BLOOD HAEMOCONCENTR 700 MIDI (MISCELLANEOUS) IMPLANT
BLOWER MISTER CAL-MED (MISCELLANEOUS) ×1 IMPLANT
BNDG ELASTIC 4X5.8 VLCR STR LF (GAUZE/BANDAGES/DRESSINGS) ×1 IMPLANT
BNDG ELASTIC 6X5.8 VLCR STR LF (GAUZE/BANDAGES/DRESSINGS) ×1 IMPLANT
BNDG GAUZE DERMACEA FLUFF 4 (GAUZE/BANDAGES/DRESSINGS) ×1 IMPLANT
BOOT SUTURE AID YELLOW STND (SUTURE) IMPLANT
BULB SUCTION JP 400 (SUCTIONS) ×1 IMPLANT
CANISTER SUCT 3000ML PPV (MISCELLANEOUS) ×1 IMPLANT
CANNULA AORTIC ROOT 9FR (CANNULA) ×1 IMPLANT
CANNULA EZ GLIDE 8.0 24FR (CANNULA) IMPLANT
CANNULA MC2 2 STG 29/37 NON-V (CANNULA) ×1 IMPLANT
CANNULA MC2 TWO STAGE (CANNULA) ×1
CANNULA SUMP PERICARDIAL (CANNULA) IMPLANT
CANNULA VESSEL 3MM 2 BLNT TIP (CANNULA) ×1 IMPLANT
CATH ROBINSON RED A/P 18FR (CATHETERS) ×3 IMPLANT
CLIP APPLIE 11 MED OPEN (CLIP) ×1 IMPLANT
CLIP APPLIE 9.375 SM OPEN (CLIP) ×2 IMPLANT
CLIP TI WIDE RED SMALL 24 (CLIP) IMPLANT
CLIP VESOCCLUDE MED 24/CT (CLIP) IMPLANT
CLIP VESOCCLUDE SM WIDE 24/CT (CLIP) IMPLANT
CONNECTOR BLAKE 2:1 CARIO BLK (MISCELLANEOUS) IMPLANT
COVER MAYO STAND STRL (DRAPES) IMPLANT
DERMABOND ADVANCED .7 DNX12 (GAUZE/BANDAGES/DRESSINGS) ×2 IMPLANT
DEVICE EXCLUSIN ATRCLP VLAA 45 (Miscellaneous) IMPLANT
DRAIN CHANNEL 24FR (DRAIN) ×2 IMPLANT
DRAIN CONNECTOR BLAKE 1:1 (MISCELLANEOUS) IMPLANT
DRAIN JACKSON PRATT 10MM FLAT (MISCELLANEOUS) ×1 IMPLANT
DRAPE CARDIOVASCULAR INCISE (DRAPES) ×1
DRAPE SRG 135X102X78XABS (DRAPES) ×1 IMPLANT
DRSG AQUACEL AG ADV 3.5X14 (GAUZE/BANDAGES/DRESSINGS) ×1 IMPLANT
ELECT REM PT RETURN 9FT ADLT (ELECTROSURGICAL) ×2
ELECTRODE REM PT RTRN 9FT ADLT (ELECTROSURGICAL) ×2 IMPLANT
FELT TEFLON 1X6 (MISCELLANEOUS) ×1 IMPLANT
GAUZE 4X4 16PLY ~~LOC~~+RFID DBL (SPONGE) IMPLANT
GAUZE SPONGE 4X4 12PLY STRL (GAUZE/BANDAGES/DRESSINGS) ×2 IMPLANT
GAUZE SPONGE 4X4 12PLY STRL LF (GAUZE/BANDAGES/DRESSINGS) IMPLANT
GLOVE BIO SURGEON STRL SZ7 (GLOVE) IMPLANT
GLOVE BIOGEL M 8.0 STRL (GLOVE) ×3 IMPLANT
GLOVE BIOGEL PI IND STRL 6 (GLOVE) IMPLANT
GLOVE BIOGEL PI IND STRL 7.0 (GLOVE) IMPLANT
GLOVE BIOGEL PI IND STRL 7.5 (GLOVE) IMPLANT
GLOVE SS BIOGEL STRL SZ 7 (GLOVE) IMPLANT
GLOVE SURG SS PI 7.0 STRL IVOR (GLOVE) IMPLANT
GLOVE SURG SS PI 7.5 STRL IVOR (GLOVE) IMPLANT
GOWN STRL REUS W/ TWL LRG LVL3 (GOWN DISPOSABLE) ×3 IMPLANT
GOWN STRL REUS W/ TWL XL LVL3 (GOWN DISPOSABLE) ×3 IMPLANT
GOWN STRL REUS W/TWL LRG LVL3 (GOWN DISPOSABLE) ×8
GOWN STRL REUS W/TWL XL LVL3 (GOWN DISPOSABLE) ×2
HEMOSTAT SURGICEL 2X14 (HEMOSTASIS) IMPLANT
HEMOSTAT SURGICEL NU-KNIT 6X9 (HEMOSTASIS) ×1 IMPLANT
INSERT FOGARTY XLG (MISCELLANEOUS) ×1 IMPLANT
KIT BASIN OR (CUSTOM PROCEDURE TRAY) ×1 IMPLANT
KIT TURNOVER KIT B (KITS) ×1 IMPLANT
KIT VASOVIEW HEMOPRO 2 VH 4000 (KITS) ×1 IMPLANT
KNIFE MICRO-UNI 3.5 30 DEG (BLADE) ×1 IMPLANT
LEAD PACING MYOCARDI (MISCELLANEOUS) ×1 IMPLANT
NS IRRIG 1000ML POUR BTL (IV SOLUTION) ×5 IMPLANT
OFFPUMP STABILIZER SUV (MISCELLANEOUS) ×1 IMPLANT
PACK E OPEN HEART (SUTURE) ×1 IMPLANT
PACK OPEN HEART (CUSTOM PROCEDURE TRAY) ×1 IMPLANT
PAD ARMBOARD 7.5X6 YLW CONV (MISCELLANEOUS) ×2 IMPLANT
PAD ELECT DEFIB RADIOL ZOLL (MISCELLANEOUS) ×1 IMPLANT
POSITIONER HEAD DONUT 9IN (MISCELLANEOUS) ×1 IMPLANT
POWDER SURGICEL 3.0 GRAM (HEMOSTASIS) IMPLANT
PROBE INTRAVASCULAR 45 (VASCULAR PRODUCTS) ×1 IMPLANT
PUNCH AORTIC ROTATE 4.0MM (MISCELLANEOUS) IMPLANT
SET MPS 3-ND DEL (MISCELLANEOUS) IMPLANT
SPONGE T-LAP 18X18 ~~LOC~~+RFID (SPONGE) IMPLANT
SUT BONE WAX W31G (SUTURE) ×1 IMPLANT
SUT ETHIBOND 2 0 SH (SUTURE) ×5
SUT ETHIBOND 2 0 SH 36X2 (SUTURE) IMPLANT
SUT ETHILON 2 0 PSLX (SUTURE) IMPLANT
SUT MNCRL AB 3-0 PS2 18 (SUTURE) IMPLANT
SUT MNCRL AB 3-0 PS2 27 (SUTURE) ×2 IMPLANT
SUT PROLENE 4 0 RB 1 (SUTURE) ×4
SUT PROLENE 4 0 SH DA (SUTURE) ×2 IMPLANT
SUT PROLENE 4-0 RB1 .5 CRCL 36 (SUTURE) ×2 IMPLANT
SUT PROLENE 5 0 C 1 36 (SUTURE) IMPLANT
SUT PROLENE 6 0 C 1 30 (SUTURE) IMPLANT
SUT PROLENE 7 0 BV 1 (SUTURE) IMPLANT
SUT PROLENE 7 0 BV1 MDA (SUTURE) IMPLANT
SUT SILK  1 MH (SUTURE) ×6
SUT SILK 1 MH (SUTURE) ×6 IMPLANT
SUT SILK 2 0 SH (SUTURE) IMPLANT
SUT SILK PERMA HAND 0-CT-1 (SUTURE) IMPLANT
SUT STEEL STERNAL CCS#1 18IN (SUTURE) ×2 IMPLANT
SUT VIC AB 2-0 CT1 36 (SUTURE) IMPLANT
SUT VIC AB 2-0 CTX 27 (SUTURE) ×2 IMPLANT
SYR BULB IRRIG 60ML STRL (SYRINGE) IMPLANT
SYSTEM SAHARA CHEST DRAIN ATS (WOUND CARE) ×2 IMPLANT
TAPE CLOTH SURG 4X10 WHT LF (GAUZE/BANDAGES/DRESSINGS) IMPLANT
TAPE PAPER 2X10 WHT MICROPORE (GAUZE/BANDAGES/DRESSINGS) IMPLANT
TOWEL GREEN STERILE (TOWEL DISPOSABLE) ×1 IMPLANT
TOWEL GREEN STERILE FF (TOWEL DISPOSABLE) ×1 IMPLANT
TRAY FOLEY SLVR 16FR TEMP STAT (SET/KITS/TRAYS/PACK) ×1 IMPLANT
TUBE CONNECTING 12X1/4 (SUCTIONS) IMPLANT
TUBE SUCT INTRACARD DLP 20F (MISCELLANEOUS) ×1 IMPLANT
TUBING LAP HI FLOW INSUFFLATIO (TUBING) ×1 IMPLANT
UNDERPAD 30X36 HEAVY ABSORB (UNDERPADS AND DIAPERS) ×1 IMPLANT
WATER STERILE IRR 1000ML POUR (IV SOLUTION) ×2 IMPLANT

## 2022-02-19 NOTE — Progress Notes (Signed)
Pt to preop via stretcher

## 2022-02-19 NOTE — Transfer of Care (Signed)
Immediate Anesthesia Transfer of Care Note  Patient: Logan Crawford  Procedure(s) Performed: CORONARY ARTERY BYPASS GRAFTING (CABG) X THREE, USING ENDOSCOPICALLY HARVESTED RIGHT GREATER SAPHENOUS VEIN  AND LEFT ATRIAL APPENDAGE CLIPPING (Chest)  Patient Location: ICU  Anesthesia Type:General  Level of Consciousness: Patient remains intubated per anesthesia plan  Airway & Oxygen Therapy: Patient remains intubated per anesthesia plan and Patient placed on Ventilator (see vital sign flow sheet for setting)  Post-op Assessment: Report given to RN  Post vital signs: Reviewed and unstable  Last Vitals:  Vitals Value Taken Time  BP 142/63   Temp 36.7 C 02/19/22 1712  Pulse    Resp 16 02/19/22 1712  SpO2 93   Vitals shown include unvalidated device data.  Last Pain:  Vitals:   02/19/22 0500  TempSrc: Oral  PainSc:       Patients Stated Pain Goal: 0 (02/18/22 1627)  Complications: No notable events documented.

## 2022-02-19 NOTE — Progress Notes (Signed)
      [address]       [place] [address]             [phone]      S/p CABG, IABP  Intubated, sedated  BP 118/85 (BP Location: Right Arm)   Pulse 63   Temp 98.1 F (36.7 C) (Oral)   Resp (!) 7   Ht 6' (1.829 m)   Wt 100.9 kg   SpO2 90%   BMI 30.16 kg/m  PA- no wave form. A line poor wave form  CI= 1.4 on milrinone 0.125, epi 8, norepi 7 IABP 1:1  K= 6.9 CXR pending  Early postop  Hemodynamics poor with low output Need to address swan and A line measurement  Increase milrinone to 0.25, A pace at 80, volume as needed Hope to wean epi and norepi  Viviann Spare C. Dorris Fetch, MD [facility] [phone]

## 2022-02-19 NOTE — Brief Op Note (Signed)
02/17/2022 - 02/19/2022  12:46 PM  PATIENT:  Logan Crawford  46 y.o. male  PRE-OPERATIVE DIAGNOSIS:  Coronary Artery Disease  POST-OPERATIVE DIAGNOSIS:  Coronary Artery Disease  PROCEDURE:   CORONARY ARTERY BYPASS GRAFTING (CABG) X THREE, USING ENDOSCOPICALLY HARVESTED RIGHT GREATER SAPHENOUS VEIN    LEFT ATRIAL APPENDAGE CLIPPING   PLACEMENT OF INTRA-AORTIC BALLOON PUMP (RIGHT FEMORAL)  LIMA-D1 SVG-LAD SVG-PDA    Vein harvest time: Vein prep time:  SURGEON:   Enter, Waverly Ferrari, MD - Primary  PHYSICIAN ASSISTANT: Maeli Spacek  ASSISTANTS: Tanda Rockers, RN, RN First Assistant    ANESTHESIA:   general  EBL:   BLOOD ADMINISTERED: 2 UNITS PRBCs  DRAINS:  left pleural and mediastinal drains    LOCAL MEDICATIONS USED:  Exparel  SPECIMEN:  No Specimen  DISPOSITION OF SPECIMEN:  N/A  COUNTS:  YES  DICTATION: .Dragon Dictation  PLAN OF CARE: Admit to inpatient   PATIENT DISPOSITION:  ICU - intubated and hemodynamically stable.   Delay start of Pharmacological VTE agent (>24hrs) due to surgical blood loss or risk of bleeding: yes

## 2022-02-19 NOTE — Hospital Course (Addendum)
History of Present Illness:    This 46 y.o. male with medical history significant for HTN, polysubstance abuse, tobacco use disorder, CAD s/p PCI in 04/2021, G1 DD, EF 60-65 on  10/2021, hospitalized from 11/16/21 to 11/19/21 with NSTEMI for which she underwent repeat Left cardiac cath on 9/25 showing significant two-vessel coronary artery disease which was  treated medically and on DAPT, He presents to the ED with chest pain typical to prior NSTEMI. EMS administered aspirin and sublingual nitro which relieved his pain but after 20 minutes the pain recurred. Troponin trending up 29> 256> 429>835.  EKG shows ST depression V1 to V3 and aVF. Chest x-ray no acute disease.  Patient is started on IV heparin and admitted for further evaluation.  Patient was evaluated by cardiology, recommended left heart cath. Patient underwent left heart cath 02/13/22 which showed recurrent in-stent restenosis in the LAD in the setting of continued cocaine use and poor compliance with antiplatelet medications.  Patient also has significant progression of the RCA disease.  Cardiologist recommended transfer to [facility] for CABG. He was evaluated by Dr.Enter.  Coronary bypass graft for was offered to the patient.  Mr. Brahm elected to proceed with surgery.  Hospital Course:  Mr. Demarest was taken to the OR where CABG x 3 was accomplished.  It is noted in the patient a significant intraoperative hyperkalemia and bradycardic arrest prior to the initiation of cardiopulmonary bypass.  Intraoperatively he did require placement of intra-aortic balloon pump.  Additionally he was placed on multiple drips for inotropic and vasopressor support.  He was taken to the surgical intensive care unit in critical condition.   Postoperative hospital course:  Early postoperative course was notable for requiring significant support with drips that included epinephrine, Levophed, vasopressin, milrinone as well as intra-aortic balloon pump.  Nephrology,  critical care medicine and advanced heart failure consultations were obtained and to assist with management.  He remained intubated on the ventilator at that time showed significant evidence agitation requiring adjustments in narcotics and sedatives.  Blood pressure became elevated with these episodes of agitation.  Potassium has improved but he did not show evidence of acute kidney injury.  Creatinine has shown a steady improvement over time.  He did require atrial pacing until resume normal sinus.  Bedside echocardiogram showed ejection fraction of 55% and the RV appeared to be okay.  The etiology of the hyperkalemia is not entirely clear and there was some concern that it could have been a atypical malignant hyperthermia.  Drips were weaned based on hemodynamics.  Intra-aortic balloon pump was removed on 02/21/2022.  Blood sugars were managed with usual protocols.  He did require significant diuresis due to expected postoperative volume overload.  He also has expected postoperative acute blood loss anemia which is being monitored closely.  He was weaned and extubated on POD #1.  His developed AKI with creatinine level peaking at 2.5.  This trended down without issue.  Patient has history of medicine non-compliance and cocaine abuse.  His toxicology report was positive on admission.  Due to this CSW and care management consult was obtained to assist with medication assistance and post operative needs.  The patient's pacing wires were removed on 02/24/2022.  He had an increased amount of coughing and developed sternal drainage from the top of his sternotomy.  He was started on Ancef and was monitored closely.  The drainage has stopped and the incision continues to look as though it is healing well without evidence of infection.  He does continue to have elevated white blood cell count with most recent value 02/28/2022 of 14.9.  He has stabilized advanced heart failure perspective medication titrated  appropriately.

## 2022-02-19 NOTE — Op Note (Addendum)
OPERATIVE NOTE: Patient Name: Logan Crawford Date of Birth: 27-Jan-1976 Date of Operation: 02/19/22   PRE-OPERATIVE DIAGNOSIS: Coronary artery disease Ischemic cardiomyopathy NSTEMI Polysubstance use Tobacco use Hypertension Prior PCI   POST-OPERATIVE DIAGNOSIS: Same   OPERATION: CABG x 3 (LIMA - Diag, SVG - LAD, SVG - RCA) Endoscopic saphenous vein harvest, right leg Insertion of intraaortic balloon pump, right common femoral artery Left atrial appendage closure   SURGEON: Waverly Ferrari Monchel Pollitt MD   ASSISTANT: Jillyn Hidden PA   EBL 100cc   FINDINGS: EF reduced before surgery EF reduced after surgery   Poor quality vein conduits Poor quality IMA conduit LAD 1.23mm Diag 1.25 mm, flow 45 cc/min RCA 1.25 mm, flow 20 cc/min   TIMES: XC: 104 min CPB:  262 min   SPECIMENS None   COMPLICATIONS: Necessity of Intra-aortic Balloon Pump with decreased LV function   TUBES:  3 24 Fr blakes (bilateral pleural and mediastinal) 1 JP to Bulb (mediastinal)   PROCEDURE IN DETAIL: The patient was brought in the operating room and laid in supine position.  The patient was prepped and draped in standard fashion.  An arterial, pulmonary arterial and venous lines were placed by anesthesia along with a single-lumen endotracheal tube. Vein was harvested out of the right leg endoscopically by the physician assistant. Local analgesia given to the sternum. Sternotomy was performed and LIMA taken down after 5000u heparin given. Flow through the artery was below average but adequate. Pericardium was opened.  Full dose heparin was given to achieve an ACT of 480 and aortic cannula inserted. Suddenly, the patient went into complete heart block. Potassium was noted to be 6.5 to 6.8 at this time, up from around 4.5. Venous cannulas were inserted.The patient was placed on cardiopulmonary bypass urgently and a ventricular wire placed to get the left heart vented. Using a cross-clamp and cardiac  arrest was achieved with 1.2  L of modified blood [place] cardioplegia antegrade. Topical cooling was used as well on the heart.  RCA sewn distally with 7-0 prolene, then proximal with 6-0 prolene. Next, I placed a 73mm Atriclip over the left atrial appendage. Following this, LIMA evaluated and it had poor flow initially, whereas flow had been adequate when it taken down. I placed a small 42mm probe through it retrograde and used paperverine. Flow improved to adequate. I still had concern regarding it. I then Placed the LIMA to Diag with 7-0 prolene. LAD sewn distally with 7-0 prolene, then proximal to the aorta with 6-0 prolene. I used two components of vein in order to have vein of sufficient size in continuity. This was completed with 6-0 prolene and attention paid that was in proper reverse orientation   Next, the cross-clamp was released  with the root vent on.  After de-airing the heart came back into regular sinus rhythm and the heart was not able to take over the circulation. The potassium was around 7 at this point, still too high wean off. We were using further filtering techniques through the cardiopulmonary bypass pump as well which necessitated at least an extra hour plus on bypass. This was in addition to lasix, insulin, and calcium. The heart function was reduced more than prior. I evaluated grafts with doppler and they were adequate. I placed a needle and wire in the right common femoral artery under ultrasound guidance and ensured on the intra-aortic balloon pump was in reasonable location by TEE. It was placed on and 10 of epi and 10 of levo  along with 0.25 milrinone started.  Bypass was weaned and cannulas were removed, then protamine was given and hemostasis was achieved. Chest tubes were placed as above along with ventricular and atrial wires.  The chest was then closed in interrupted steel wire and the presternal layers were closed in 3 layers of absorbable suture.  The patient had a guarded  status and was transferred to the postoperative care unit on 10 of epinephrine and 6 of Levophed and 0.125 milrinone in sinus around 80 and on an intra-aortic balloon pump. All surgical counts were correct.

## 2022-02-19 NOTE — Progress Notes (Signed)
Patient seen and reviewed and ready for OR today.

## 2022-02-19 NOTE — Anesthesia Procedure Notes (Signed)
Arterial Line Insertion Start/End12/27/2023 6:45 AM, 02/19/2022 7:03 AM Performed by: Adair Laundry, CRNA, CRNA  Patient location: Pre-op. Preanesthetic checklist: patient identified, IV checked, risks and benefits discussed, surgical consent, monitors and equipment checked and timeout performed Lidocaine 1% used for infiltration and patient sedated Left, radial was placed Catheter size: 20 G Hand hygiene performed  and maximum sterile barriers used   Attempts: 1 Procedure performed without using ultrasound guided technique. Following insertion, dressing applied and Biopatch. Post procedure assessment: normal  Patient tolerated the procedure well with no immediate complications.

## 2022-02-19 NOTE — Consult Note (Signed)
NAME:  Logan Crawford, MRN:  [id number], DOB:  [birth year], LOS: 2 ADMISSION DATE:  02/17/2022, CONSULTATION DATE:  12/27 REFERRING MD:  12/27, CHIEF COMPLAINT:  post-op CABG  History of Present Illness:  39 yom w/ hx as outlined below had known 2V CAD presented to ER 12/21 w/ CP c/w prior NSTEMI. Left Heart cath showed re-stenosis of LAD (felt 2/2 non-compliance w DAPT and cocaine abuse). Also found to have sig diseased RCA. Presents to the CVICU 12/27 s/p CABG X 3V w/ placement of IABP, had brief CHB intraoperative w/ potassium noted around 6.8 and pt placed on bypass urgently  PCCM asked to assist w/ post op care   Pertinent  Medical History  CAD . 11/2019 NSTEMI/PCI: LM min irregs, LAD 29m (2.75x22 Resolute Onyx DES)  HFpEF 60-65% HTN, HL, tobacco abuse  Polysubstance abuse.  Significant Hospital Events: Including procedures, antibiotic start and stop dates in addition to other pertinent events   12/21 admitted. Left heart cath: Progression of Severe Multivessel: - 2 potential culprit lesions: distal-mid RCA ulcerated 95% ruptured plaque (mid #2), distal mid LAD stent edge 99% with diffuse disease elsewhere RCA:-proximal #1 70%, followed by proximal #2 70%, mid #1 1 70% and mid #2 95%, ostial PDA 60% LAD: Long stented area with proximal concentric 70% are, jailed D2 85% Zio, distal stent focal 80% followed by segmental 50% then 99% at distal edge. LCx-small OM1 and OM 2 with bifurcating OM 3, AVG circumflex with small branches  12/26 ECHO:  1. Small area of distal septal/inferior basal hypokinesis . Left  ventricular ejection fraction, by estimation, is 55%. The left ventricle  has normal function. The left ventricle has no regional wall motion  abnormalities. There is mild left ventricular hypertrophy. Left ventricular diastolic parameters are consistent with  Grade I diastolic dysfunction (impaired relaxation).  Right ventricular systolic function is normal.  12/27 3 V CABG and  placement of IABP by Enter. Intra-op received 2 units PRBC.    Interim History / Subjective:  Sedated   Objective   Blood pressure 118/85, pulse 63, temperature 98.1 F (36.7 C), temperature source Oral, resp. rate (Abnormal) 7, height 6' (1.829 m), weight 100.9 kg, SpO2 90 %. PAP: (25-65)/(-16-21) 25/12      Intake/Output Summary (Last 24 hours) at 02/19/2022 1335 Last data filed at 02/19/2022 1245 Gross per 24 hour  Intake 1300 ml  Output 450 ml  Net 850 ml   Filed Weights   02/17/22 1500  Weight: 100.9 kg    Examination: General: 46 year old wm sedated on vent  HENT: NCAT no JVD MMM Lungs: clear, mid sternal dressing intact. Anterior CT w/ bloody output Cardiovascular: RRR Abdomen: soft. Hypoactive  Extremities: no edema cool; Right cooler than left Neuro: sedated  GU: clear yellow   Resolved Hospital Problem list     Assessment & Plan:  NSTEMI w/ Severe multivessel CAD, now s/p 3 V CABG 12/27 w/ post-operative cardiogenic shock/vasoplegia  Plan Weaning pressors for MAP > 65 Wean inotropes per protocol (keeping epi and vasopressin at cont fixed dose)  CT management per CVTS Tele Pacer  Tight glycemic control   Need for mechanical ventilation  Plan Cont full vent support (no wean tonight) Repeat abg (vent changes made) VAP bundle  RASS goal 0 to -1  Severe hyperkalemia  -etiology not clear. ? Ischemia during case?  Plan Ck lactic acid  F/u abg and chem  CKD stage 2 Plan Ensure MAP > 65 Renal adjust meds  Strict I&O  Post-op anemia Plan Monitor Transfuse as needed.    H/o tobacco abuse and poly-substance abuse Plan Cessation support     Best Practice (right click and "Reselect all SmartList Selections" daily)   Diet/type: NPO DVT prophylaxis: SCD GI prophylaxis: H2B Lines: Central line Foley:  Yes, and it is still needed Code Status:  full code Last date of multidisciplinary goals of care discussion [pending]  Labs    CBC: Recent Labs  Lab 02/13/22 0515 02/14/22 0228 02/15/22 0646 02/18/22 0230 02/19/22 0603 02/19/22 0756 02/19/22 1048 02/19/22 1117 02/19/22 1144 02/19/22 1148 02/19/22 1200  WBC 7.7 6.9 7.5 9.4 9.3  --   --   --   --   --   --   HGB 14.2 14.1 14.7 13.8 15.3   < > 8.8* 8.8* 9.2* 9.5* 8.8*  HCT 41.9 40.8 43.3 41.3 43.9   < > 26.0* 26.0* 26.7* 28.0* 26.0*  MCV 89.3 87.9 89.5 92.2 89.6  --   --   --   --   --   --   PLT 296 286 271 274 263  --   --   --  172  --   --    < > = values in this interval not displayed.    Basic Metabolic Panel: Recent Labs  Lab 02/14/22 0228 02/15/22 [id number] 02/16/22 0559 02/17/22 0644 02/18/22 0230 02/19/22 0603 02/19/22 0756 02/19/22 0901 02/19/22 0945 02/19/22 1016 02/19/22 1034 02/19/22 1048 02/19/22 1117 02/19/22 1148 02/19/22 1200  NA 138 136 138 138 137 136   < > 137 136   < > 135 135 133* 131* 132*  K 4.1 4.9 4.6 4.6 4.5 4.6   < > 5.4* 6.5*   < > 7.5* 7.3* 7.3* 7.2* 7.1*  CL 109 108 110 106 107 105   < > 106 106  --   --  104  --  100 99  CO2 23 18* 22 23 21* 22  --   --   --   --   --   --   --   --   --   GLUCOSE 100* 70 83 95 117* 105*   < > 105* 84  --   --  98  --  98 92  BUN 15 13 12 16 13 13    < > 13 14  --   --  13  --  14 13  CREATININE 1.59* 1.44* 1.29* 1.47* 1.41* 1.34*   < > 1.50* 1.60*  --   --  1.60*  --  1.70* 1.60*  CALCIUM 8.3* 8.9 8.9 9.1 8.8* 9.0  --   --   --   --   --   --   --   --   --   MG 2.1  --   --   --   --   --   --   --   --   --   --   --   --   --   --   PHOS 4.5  --   --   --   --   --   --   --   --   --   --   --   --   --   --    < > = values in this interval not displayed.   GFR: Estimated Creatinine Clearance: 69.4 mL/min (A) (by C-G formula based on SCr of 1.6 mg/dL (H)). Recent  Labs  Lab 02/14/22 0228 02/15/22 0646 02/18/22 0230 02/19/22 0603  WBC 6.9 7.5 9.4 9.3    Liver Function Tests: No results for input(s): "AST", "ALT", "ALKPHOS", "BILITOT", "PROT", "ALBUMIN" in the last  168 hours. No results for input(s): "LIPASE", "AMYLASE" in the last 168 hours. No results for input(s): "AMMONIA" in the last 168 hours.  ABG    Component Value Date/Time   PHART 7.346 (L) 02/19/2022 1117   PCO2ART 38.9 02/19/2022 1117   PO2ART 332 (H) 02/19/2022 1117   HCO3 21.2 02/19/2022 1117   TCO2 22 02/19/2022 1200   ACIDBASEDEF 4.0 (H) 02/19/2022 1117   O2SAT 100 02/19/2022 1117     Coagulation Profile: No results for input(s): "INR", "PROTIME" in the last 168 hours.  Cardiac Enzymes: No results for input(s): "CKTOTAL", "CKMB", "CKMBINDEX", "TROPONINI" in the last 168 hours.  HbA1C: Hgb A1c MFr Bld  Date/Time Value Ref Range Status  05/18/2021 08:23 AM 5.8 (H) 4.8 - 5.6 % Final    Comment:    (NOTE) Pre diabetes:          5.7%-6.4%  Diabetes:              >6.4%  Glycemic control for   <7.0% adults with diabetes   11/26/2019 07:02 AM 5.7 (H) 4.8 - 5.6 % Final    Comment:    (NOTE) Pre diabetes:          5.7%-6.4%  Diabetes:              >6.4%  Glycemic control for   <7.0% adults with diabetes     CBG: No results for input(s): "GLUCAP" in the last 168 hours.  Review of Systems:   Not able   Past Medical History:  He,  has a past medical history of CAD (coronary artery disease), Hyperlipidemia, Hypertension, Ischemic cardiomyopathy, and Tobacco use (11/25/2019).   Surgical History:   Past Surgical History:  Procedure Laterality Date   CORONARY STENT INTERVENTION N/A 01/24/2020   Procedure: CORONARY STENT INTERVENTION;  Surgeon: Iran Ouch, MD;  Location: [facility];  Service: Cardiovascular;  Laterality: N/A;   CORONARY STENT INTERVENTION N/A 05/20/2021   Procedure: CORONARY STENT INTERVENTION;  Surgeon: Iran Ouch, MD;  Location: [facility];  Service: Cardiovascular;  Laterality: N/A;   INTRAVASCULAR ULTRASOUND/IVUS N/A 05/20/2021   Procedure: Intravascular Ultrasound/IVUS;  Surgeon: Iran Ouch, MD;   Location: [facility];  Service: Cardiovascular;  Laterality: N/A;   IRRIGATION AND DEBRIDEMENT KNEE Left    LEFT HEART CATH AND CORONARY ANGIOGRAPHY N/A 01/24/2020   Procedure: LEFT HEART CATH AND CORONARY ANGIOGRAPHY;  Surgeon: Iran Ouch, MD;  Location: [facility];  Service: Cardiovascular;  Laterality: N/A;   LEFT HEART CATH AND CORONARY ANGIOGRAPHY N/A 11/18/2021   Procedure: LEFT HEART CATH AND CORONARY ANGIOGRAPHY;  Surgeon: Iran Ouch, MD;  Location: [facility];  Service: Cardiovascular;  Laterality: N/A;   LEFT HEART CATH AND CORONARY ANGIOGRAPHY N/A 02/13/2022   Procedure: LEFT HEART CATH AND CORONARY ANGIOGRAPHY;  Surgeon: Marykay Lex, MD;  Location: [facility];  Service: Cardiovascular;  Laterality: N/A;   LEFT HEART CATH AND CORS/GRAFTS ANGIOGRAPHY N/A 05/20/2021   Procedure: LEFT HEART CATH AND CORS/GRAFTS ANGIOGRAPHY;  Surgeon: Iran Ouch, MD;  Location: [facility];  Service: Cardiovascular;  Laterality: N/A;   TYMPANOSTOMY TUBE PLACEMENT Bilateral      Social History:   reports that  he quit smoking about 2 years ago. His smoking use included cigarettes. He has a 22.00 pack-year smoking history. He has never used smokeless tobacco. He reports current alcohol use of about 1.0 standard drink of alcohol per week. He reports that he does not use drugs.   Family History:  His family history includes Arrhythmia in his mother; Coronary artery disease in his father and mother; Heart failure in his mother.   Allergies No Known Allergies   Home Medications  Prior to Admission medications   Medication Sig Start Date End Date Taking? Authorizing Provider  albuterol (VENTOLIN HFA) 108 (90 Base) MCG/ACT inhaler Inhale 2 puffs into the lungs every 6 (six) hours as needed for wheezing or shortness of breath. 11/19/21 02/17/22  Emeterio Reeve, DO  aspirin EC 81 MG tablet Take 1 tablet (81 mg total) by mouth daily.  Swallow whole. 11/19/21   Emeterio Reeve, DO  carvedilol (COREG) 25 MG tablet Take 1 tablet (25 mg total) by mouth 2 (two) times daily with a meal. 02/17/22   Shawna Clamp, MD  isosorbide mononitrate (IMDUR) 60 MG 24 hr tablet Take 1 tablet (60 mg total) by mouth 2 (two) times daily. 02/17/22   Shawna Clamp, MD  nitroGLYCERIN (NITROSTAT) 0.4 MG SL tablet Place 1 tablet (0.4 mg total) under the tongue every 5 (five) minutes as needed for chest pain (if chest pain not resolved after 3 doses, please call 911). 11/19/21   Emeterio Reeve, DO     Critical care time: 32 min    Erick Colace ACNP-BC [place] Pager # [phone] OR # [phone] if no answer

## 2022-02-19 NOTE — Consult Note (Signed)
Reason for Consult: Hyperkalemia Referring Physician: Denese Killings, MD  Logan Crawford is an 46 y.o. male with a PMH significant for CAD, HTN, cocaine use, tobacco abuse, ischemic CMP, and CKD stage IIIa who presented to [facility] with chest pain.  He underwent LHC on 02/13/22 with evidence of progression of severe multivessel CAD with in-stent stenosis.  He was transferred to [facility] on 02/17/22 for CT surgery evaluation.  He underwent 3 vessel CABG earlier today which was complicated by intraoperative hyperkalemia and bradycardic arrest prior to the initiation of cardiopulmonary bypass.  His K rose from 4.3 at initiation of case to 5.4 within the first hour, then 6.2 after 2 hours and then up to 7.3 3 hours into surgery.  He was placed on cardiopulmonary bypass and remained on for an extended period and K remained elevated throughout the case but improved to 5 at the end.  These potassium levels were drawn on an I-stat, bmet pending.  He was given IV lasix and has had a vigorous response.  He was also given IV bicarb x 1 dose and ordered Lokelma 10 grams bid (given at 7:40 pm).  He is currently being paced and is on vasopressin and epinephrine gtt.  He was given IV albumin as well.  He has had 800 mL of UOP since arriving in the ICU.   He was given Plasmalyte A which has 5 mEq of potassium and can cause hyperkalemia especially in patients with CHF and CKD.  He also received tranexamic acid which has been associated with hyperkalemia.  Trend in Creatinine:  Creatinine, Ser  Date/Time Value Ref Range Status  02/19/2022 02:43 PM 1.60 (H) 0.61 - 1.24 mg/dL Final  38/45/3646 80:32 PM 1.70 (H) 0.61 - 1.24 mg/dL Final  02/16/8249 03:70 PM 1.60 (H) 0.61 - 1.24 mg/dL Final  48/88/9169 45:03 PM 1.70 (H) 0.61 - 1.24 mg/dL Final  88/82/8003 49:17 PM 1.60 (H) 0.61 - 1.24 mg/dL Final  91/50/5697 94:80 AM 1.70 (H) 0.61 - 1.24 mg/dL Final  16/55/3748 27:07 AM 1.60 (H) 0.61 - 1.24 mg/dL Final  86/75/4492 01:00 AM 1.60 (H)  0.61 - 1.24 mg/dL Final  71/21/9758 83:25 AM 1.50 (H) 0.61 - 1.24 mg/dL Final  49/82/6415 83:09 AM 1.20 0.61 - 1.24 mg/dL Final  40/76/8088 11:03 AM 1.34 (H) 0.61 - 1.24 mg/dL Final  15/94/5859 29:24 AM 1.41 (H) 0.61 - 1.24 mg/dL Final  46/28/6381 77:11 AM 1.47 (H) 0.61 - 1.24 mg/dL Final  65/79/0383 33:83 AM 1.29 (H) 0.61 - 1.24 mg/dL Final  29/19/1660 60:04 AM 1.44 (H) 0.61 - 1.24 mg/dL Final  59/97/7414 23:95 AM 1.59 (H) 0.61 - 1.24 mg/dL Final  32/03/3341 56:86 PM 1.33 (H) 0.61 - 1.24 mg/dL Final  16/83/7290 21:11 PM 1.34 (H) 0.61 - 1.24 mg/dL Final  55/20/8022 33:61 AM 1.16 0.61 - 1.24 mg/dL Final  22/44/9753 00:51 AM 1.13 0.61 - 1.24 mg/dL Final  12/15/1171 56:70 AM 1.18 0.61 - 1.24 mg/dL Final  14/11/3011 14:38 PM 1.20 0.61 - 1.24 mg/dL Final  88/75/7972 82:06 AM 1.30 (H) 0.61 - 1.24 mg/dL Final  01/56/1537 94:32 AM 1.11 0.61 - 1.24 mg/dL Final  76/14/7092 95:74 PM 1.62 (H) 0.61 - 1.24 mg/dL Final  73/40/3709 64:38 AM 0.97 0.61 - 1.24 mg/dL Final  38/18/4037 54:36 AM 1.33 (H) 0.61 - 1.24 mg/dL Final  06/77/0340 35:24 PM 1.39 (H) 0.61 - 1.24 mg/dL Final  81/85/9093 11:21 PM 1.09 0.61 - 1.24 mg/dL Final    PMH:   Past Medical History:  Diagnosis Date   CAD (coronary artery disease)    a. 11/2019 NSTEMI/PCI: LM min irregs, LAD 53m (2.75x22 Resolute Onyx DES), 40d, D2 min irregs, LCX/LPAV min irregs, RCA 60ost, 40p/m, EF 45-50%; b. 11/2020 MV: EF 43%, small area of apical ischemia ->? over processing-->low risk.   Hyperlipidemia    Hypertension    Ischemic cardiomyopathy    a. 11/2019 LV Gram: EF 45-50%; b. 12/2019 Echo: EF 50% w/ mod mid-apical anteroseptal HK, GrI DD, nl RV fxn, mild Ao sclerosis.   Tobacco use 11/25/2019    PSH:   Past Surgical History:  Procedure Laterality Date   CORONARY STENT INTERVENTION N/A 01/24/2020   Procedure: CORONARY STENT INTERVENTION;  Surgeon: Iran Ouch, MD;  Location: [facility] INVASIVE CV LAB;  Service: Cardiovascular;  Laterality:  N/A;   CORONARY STENT INTERVENTION N/A 05/20/2021   Procedure: CORONARY STENT INTERVENTION;  Surgeon: Iran Ouch, MD;  Location: [facility] INVASIVE CV LAB;  Service: Cardiovascular;  Laterality: N/A;   INTRAVASCULAR ULTRASOUND/IVUS N/A 05/20/2021   Procedure: Intravascular Ultrasound/IVUS;  Surgeon: Iran Ouch, MD;  Location: [facility] INVASIVE CV LAB;  Service: Cardiovascular;  Laterality: N/A;   IRRIGATION AND DEBRIDEMENT KNEE Left    LEFT HEART CATH AND CORONARY ANGIOGRAPHY N/A 01/24/2020   Procedure: LEFT HEART CATH AND CORONARY ANGIOGRAPHY;  Surgeon: Iran Ouch, MD;  Location: [facility] INVASIVE CV LAB;  Service: Cardiovascular;  Laterality: N/A;   LEFT HEART CATH AND CORONARY ANGIOGRAPHY N/A 11/18/2021   Procedure: LEFT HEART CATH AND CORONARY ANGIOGRAPHY;  Surgeon: Iran Ouch, MD;  Location: [facility] INVASIVE CV LAB;  Service: Cardiovascular;  Laterality: N/A;   LEFT HEART CATH AND CORONARY ANGIOGRAPHY N/A 02/13/2022   Procedure: LEFT HEART CATH AND CORONARY ANGIOGRAPHY;  Surgeon: Marykay Lex, MD;  Location: [facility] INVASIVE CV LAB;  Service: Cardiovascular;  Laterality: N/A;   LEFT HEART CATH AND CORS/GRAFTS ANGIOGRAPHY N/A 05/20/2021   Procedure: LEFT HEART CATH AND CORS/GRAFTS ANGIOGRAPHY;  Surgeon: Iran Ouch, MD;  Location: [facility] INVASIVE CV LAB;  Service: Cardiovascular;  Laterality: N/A;   TYMPANOSTOMY TUBE PLACEMENT Bilateral     Allergies: No Known Allergies  Medications:   Prior to Admission medications   Medication Sig Start Date End Date Taking? Authorizing Provider  albuterol (VENTOLIN HFA) 108 (90 Base) MCG/ACT inhaler Inhale 2 puffs into the lungs every 6 (six) hours as needed for wheezing or shortness of breath. 11/19/21 02/17/22  Sunnie Nielsen, DO  aspirin EC 81 MG tablet Take 1 tablet (81 mg total) by mouth daily. Swallow whole. 11/19/21   Sunnie Nielsen, DO  carvedilol (COREG) 25 MG tablet Take 1 tablet (25 mg total) by mouth 2 (two) times daily  with a meal. 02/17/22   Cipriano Bunker, MD  isosorbide mononitrate (IMDUR) 60 MG 24 hr tablet Take 1 tablet (60 mg total) by mouth 2 (two) times daily. 02/17/22   Cipriano Bunker, MD  nitroGLYCERIN (NITROSTAT) 0.4 MG SL tablet Place 1 tablet (0.4 mg total) under the tongue every 5 (five) minutes as needed for chest pain (if chest pain not resolved after 3 doses, please call 911). 11/19/21   Sunnie Nielsen, DO    Inpatient medications:  [START ON 02/20/2022] acetaminophen  1,000 mg Oral Q6H   Or   [START ON 02/20/2022] acetaminophen (TYLENOL) oral liquid 160 mg/5 mL  1,000 mg Per Tube Q6H   [START ON 02/20/2022] aspirin EC  325 mg Oral Daily   Or   [START ON 02/20/2022] aspirin  324 mg  Per Tube Daily   [START ON 02/20/2022] bisacodyl  10 mg Oral Daily   Or   [START ON 02/20/2022] bisacodyl  10 mg Rectal Daily   docusate  100 mg Per Tube BID   fentaNYL (SUBLIMAZE) injection  50 mcg Intravenous Once   metoprolol tartrate  12.5 mg Oral BID   Or   metoprolol tartrate  12.5 mg Per Tube BID   [START ON 02/21/2022] pantoprazole  40 mg Oral Daily   polyethylene glycol  17 g Per Tube Daily   [START ON 02/20/2022] sodium chloride flush  3 mL Intravenous Q12H   sodium zirconium cyclosilicate  10 g Oral BID    Discontinued Meds:   Medications Discontinued During This Encounter  Medication Reason   carvedilol (COREG) 12.5 MG tablet    isosorbide mononitrate (IMDUR) 60 MG 24 hr tablet    chlorhexidine (HIBICLENS) 4 % liquid 4 Application Duplicate   dexmedetomidine (PRECEDEX) 400 MCG/100ML (4 mcg/mL) infusion Entry Error   insulin regular, human (MYXREDLIN) 100 units/ 100 mL infusion Entry Error   EPINEPHrine (ADRENALIN) 5 mg in NS 250 mL (0.02 mg/mL) premix infusion Entry Error   milrinone (PRIMACOR) 20 MG/100 ML (0.2 mg/mL) infusion Entry Error   norepinephrine (LEVOPHED)  in (0.016 mg/mL) premix infusion Entry Error   phenylephrine (NEO-SYNEPHRINE) /NS premix infusion  Entry Error   magnesium sulfate (IV Push/IM) injection 40 mEq Entry Error   potassium chloride injection 80 mEq Entry Error   heparin 30,000 units/NS 1000 mL solution for CELLSAVER Entry Error   heparin sodium (porcine) 2,500 Units, papaverine 30 mg in electrolyte-A (PLASMALYTE-A PH 7.4) 500 mL irrigation Entry Error   tranexamic acid (CYKLOKAPRON) pump prime solution 202 mg Entry Error   tranexamic acid (CYKLOKAPRON) bolus via infusion - over 30 minutes 1,513.5 mg Entry Error   tranexamic acid (CYKLOKAPRON) 2,500 mg in sodium chloride 0.9 % 250 mL (10 mg/mL) infusion Entry Error   vancomycin (VANCOREADY) IVPB 1500 mg/300 mL Entry Error   ceFAZolin (ANCEF) IVPB 2g/100 mL premix Entry Error   ceFAZolin (ANCEF) IVPB 2g/100 mL premix Entry Error   hemostatic agents (no charge) Optime Patient Discharge   hemostatic agents (no charge) Optime Patient Discharge   hemostatic agents (no charge) Optime Patient Discharge   bupivacaine liposome (EXPAREL 1.3%) 20 ml and bupivacaine (MARCAINE 0.5%) 30 ml with option for NS 50mL Patient Discharge   0.9 % irrigation (POUR BTL) Patient Discharge   potassium chloride injection 80 mEq Patient Transfer   heparin 30,000 units/NS 1000 mL solution for CELLSAVER Patient Transfer   tranexamic acid (CYKLOKAPRON) pump prime solution 202 mg Patient Transfer   ceFAZolin (ANCEF) IVPB 2g/100 mL premix Patient Transfer   [facility] Blood Cardioplegia vial (lidocaine/magnesium/mannitol 0.26g-4g-6.4g) Patient Transfer   [facility] Blood Cardioplegia vial (lidocaine/magnesium/mannitol 0.26g-4g-6.4g) Patient Transfer   heparin ADULT infusion 100 units/mL (25000 units/248mL)    carvedilol (COREG) tablet 25 mg    aspirin EC tablet 81 mg    isosorbide mononitrate (IMDUR) 24 hr tablet 60 mg    nitroGLYCERIN (NITROSTAT) SL tablet 0.4 mg    acetaminophen (TYLENOL) tablet 650 mg    ondansetron (ZOFRAN) injection 4 mg    pantoprazole (PROTONIX) EC tablet 40 mg    Oral care mouth  rinse    mupirocin ointment (BACTROBAN) 2 % 1 Application    Chlorhexidine Gluconate Cloth 2 % PADS 6 each    dexmedetomidine (PRECEDEX) 400 MCG/100ML (4 mcg/mL) infusion    atorvastatin (LIPITOR) tablet 80 mg  docusate sodium (COLACE) capsule 200 mg     Social History:  reports that he quit smoking about 2 years ago. His smoking use included cigarettes. He has a 22.00 pack-year smoking history. He has never used smokeless tobacco. He reports current alcohol use of about 1.0 standard drink of alcohol per week. He reports that he does not use drugs.  Family History:   Family History  Problem Relation Age of Onset   Coronary artery disease Mother    Arrhythmia Mother        Pacemaker placed in 2021   Heart failure Mother    Coronary artery disease Father     Review of systems not obtained due to patient factors. Weight change:   Intake/Output Summary (Last 24 hours) at 02/19/2022 1937 Last data filed at 02/19/2022 1613 Gross per 24 hour  Intake 5439 ml  Output 2420 ml  Net 3019 ml   BP (!) 162/100   Pulse 63   Temp (!) 97.3 F (36.3 C)   Resp (!) 22   Ht 6' (1.829 m)   Wt 100.9 kg   SpO2 90%   BMI 30.16 kg/m  Vitals:   02/19/22 1730 02/19/22 1745 02/19/22 1800 02/19/22 1815  BP: (!) 162/100     Pulse:      Resp: (!) 22  Temp: 97.7 F (36.5 C) (!) 97.5 F (36.4 C) (!) 97.5 F (36.4 C) (!) 97.3 F (36.3 C)  TempSrc:      SpO2:      Weight:      Height:         General appearance: intubated and sedated Head: Normocephalic, without obvious abnormality, atraumatic Resp: ventilated BS bilaterally Cardio: regular rate and rhythm, S1, S2 normal, no murmur, click, rub or gallop GI: hypoactive bowel sounds, soft Extremities: extremities normal, atraumatic, no cyanosis or edema  Labs: Basic Metabolic Panel: Recent Labs  Lab 02/13/22 1916 02/14/22 0228 02/15/22 0639 02/16/22 0559 02/17/22 0644 02/18/22 0230 02/19/22 0603 02/19/22 0756  02/19/22 1048 02/19/22 1117 02/19/22 1148 02/19/22 1200 02/19/22 1217 02/19/22 1237 02/19/22 1254 02/19/22 1304 02/19/22 1326 02/19/22 1401 02/19/22 1437 02/19/22 1443  NA 136 138 136 138 138 137 136   < > 135   < > 131* 132*   < > 133* 135 134* 137 138 140 140  K 4.0 4.1 4.9 4.6 4.6 4.5 4.6   < > 7.3*   < > 7.2* 7.1*   < > 6.9* 6.3* 6.2* 5.3* 5.5* 5.0 5.0  CL 107 109 108 110 106 107 105   < > 104  --  100 99  --  101  --  102  --  107  --  106  CO2 22 23 18* 22 23 21* 22  --   --   --   --   --   --   --   --   --   --   --   --   --   GLUCOSE 141* 100* 70 83 95 117* 105*   < > 98  --  98 92  --  199*  --  163*  --  161*  --  134*  BUN < > 13  --  14 13  --  14  --  14  --  13  --  14  CREATININE 1.33* 1.59* 1.44* 1.29* 1.47* 1.41* 1.34*   < > 1.60*  --  1.70* 1.60*  --  1.70*  --  1.60*  --  1.70*  --  1.60*  CALCIUM 8.5* 8.3* 8.9 8.9 9.1 8.8* 9.0  --   --   --   --   --   --   --   --   --   --   --   --   --   PHOS  --  4.5  --   --   --   --   --   --   --   --   --   --   --   --   --   --   --   --   --   --    < > = values in this interval not displayed.   Liver Function Tests: No results for input(s): "AST", "ALT", "ALKPHOS", "BILITOT", "PROT", "ALBUMIN" in the last 168 hours. No results for input(s): "LIPASE", "AMYLASE" in the last 168 hours. No results for input(s): "AMMONIA" in the last 168 hours. CBC: Recent Labs  Lab 02/15/22 0646 02/18/22 0230 02/19/22 0603 02/19/22 0756 02/19/22 1144 02/19/22 1148 02/19/22 1401 02/19/22 1437 02/19/22 1443 02/19/22 1719  WBC 7.5 9.4 9.3  --   --   --   --   --   --  17.4*  HGB 14.7 13.8 15.3   < > 9.2*   < > 7.8* 9.5* 9.9* 11.3*  HCT 43.3 41.3 43.9   < > 26.7*   < > 23.0* 28.0* 29.0* 33.4*  MCV 89.5 92.2 89.6  --   --   --   --   --   --  91.3  PLT 271 274 263  --  172  --   --   --   --  166   < > = values in this interval not displayed.   PT/INR: @LABRCNTIP (inr:5) Cardiac Enzymes: )No results  for input(s): "CKTOTAL", "CKMB", "CKMBINDEX", "TROPONINI" in the last 168 hours. CBG: Recent Labs  Lab 02/19/22 1720 02/19/22 1801 02/19/22 1919  GLUCAP 124* 204* 137*    Iron Studies: No results for input(s): "IRON", "TIBC", "TRANSFERRIN", "FERRITIN" in the last 168 hours.  Xrays/Other Studies: DG Chest Port 1 View  Result Date: 02/19/2022 CLINICAL DATA:  Status post CABG EXAM: PORTABLE CHEST 1 VIEW COMPARISON:  02/11/2022 FINDINGS: Single frontal view of the chest demonstrates endotracheal tube overlying tracheal air column tip at level of thoracic inlet. Enteric catheter passes below diaphragm, tip excluded by collimation. The side port of the enteric catheter projects approximately 5 cm above the gastroesophageal junction. There is a right internal jugular flow directed central venous catheter tip overlying the main pulmonary outflow tract. Median sternotomy wires and mediastinal clips are noted. The cardiac silhouette is stable. Lung volumes are diminished, with crowding the central vasculature. Patchy bibasilar atelectasis. No effusion or pneumothorax. IMPRESSION: 1. Support devices as above. 2. Low lung volumes. Electronically Signed   By: Sharlet SalinaMichael  Brown M.D.   On: 02/19/2022 17:47   ECHO INTRAOPERATIVE TEE  Result Date: 02/19/2022  *INTRAOPERATIVE TRANSESOPHAGEAL REPORT *  Patient Name:   Westley HummerJOHNATHAN C Tull Date of Exam: 02/19/2022 Medical Rec #:  [id number]        Height:       72.0 in Accession #:    [id number]       Weight:       222.4 lb Date of Birth:  08/04/1975       BSA:  2.23 m Patient Age:    46 years         BP:           136/69 mmHg Patient Gender: M                HR:           68 bpm. Exam Location:  Anesthesiology Transesophogeal exam was perform intraoperatively during surgical procedure. Patient was closely monitored under general anesthesia during the entirety of examination. Indications:     CAD Native Vessel i25.10 Sonographer:     Irving Burton Senior RDCS Performing  Phys: Karna Christmas MD Diagnosing Phys: Karna Christmas MD Complications: No known complications during this procedure. POST-OP IMPRESSIONS _ Left Ventricle: The left ventricle appeared globally hypokenetic upon seperation from cardiopulmonary bypass. In time it appears unchanged from pre-bypass. _ Right Ventricle: The right ventricle appears unchanged from pre-bypass. _ Aorta: A balloon pump is in place with the tip in the proximal descending aorta. _ Aortic Valve: The aortic valve appears unchanged from pre-bypass. _ Mitral Valve: The mitral valve appears unchanged from pre-bypass. _ Tricuspid Valve: The tricuspid valve appears unchanged from pre-bypass. _ Pulmonic Valve: The pulmonic valve appears unchanged from pre-bypass. _ Interatrial Septum: The interatrial septum appears unchanged from pre-bypass. _ Pericardium: The pericardium appears unchanged from pre-bypass. PRE-OP FINDINGS  Left Ventricle: The left ventricle has normal systolic function, with an ejection fraction of 55-60%. The cavity size was normal. There is no increase in left ventricular wall thickness. There is no left ventricular hypertrophy. Right Ventricle: The right ventricle has normal systolic function. The cavity was normal. There is no increase in right ventricular wall thickness. Left Atrium: Left atrial size was normal in size. No left atrial/left atrial appendage thrombus was detected. Right Atrium: Right atrial size was normal in size. Interatrial Septum: No atrial level shunt detected by color flow Doppler. Pericardium: There is no evidence of pericardial effusion. Mitral Valve: The mitral valve is normal in structure. Mitral valve regurgitation is mild by color flow Doppler. Tricuspid Valve: The tricuspid valve was normal in structure. Tricuspid valve regurgitation was not visualized by color flow Doppler. Aortic Valve: The aortic valve is normal in structure. Aortic valve regurgitation was not visualized by color flow Doppler. There is  no stenosis of the aortic valve. Pulmonic Valve: The pulmonic valve was normal in structure. Pulmonic valve regurgitation is trivial by color flow Doppler. Aorta: The aortic root, ascending aorta and aortic arch are normal in size and structure.  Karna Christmas MD Electronically signed by Karna Christmas MD Signature Date/Time: 02/19/2022/3:10:32 PM    Final    VAS US DOPPLER PRE CABG  Result Date: 02/18/2022 PREOPERATIVE VASCULAR EVALUATION Patient Name:  ARSHAD OBERHOLZER  Date of Exam:   02/18/2022 Medical Rec #: [id number]         Accession #:    [id number] Date of Birth: 1975-11-10        Patient Gender: M Patient Age:   44 years Exam [facility] Procedure:      VAS US DOPPLER PRE CABG Referring Phys: Reuel Boom ENTER --------------------------------------------------------------------------------  Indications:  Pre-CABG. Risk Factors: Hypertension, hyperlipidemia, past history of smoking, coronary               artery disease. Performing Technologist: Marilynne Halsted RDMS, RVT  Examination Guidelines: A complete evaluation includes B-mode imaging, spectral Doppler, color Doppler, and power Doppler as needed of all accessible portions of each vessel. Bilateral testing is considered  an integral part of a complete examination. Limited examinations for reoccurring indications may be performed as noted.  Right Carotid Findings: +----------+--------+--------+--------+--------+------------------+           PSV cm/sEDV cm/sStenosisDescribeComments           +----------+--------+--------+--------+--------+------------------+ CCA Prox  90      21                                         +----------+--------+--------+--------+--------+------------------+ CCA Distal121     31                      intimal thickening +----------+--------+--------+--------+--------+------------------+ ICA Prox  75      24                      intimal thickening  +----------+--------+--------+--------+--------+------------------+ ICA Distal51      23                                         +----------+--------+--------+--------+--------+------------------+ ECA       153     19                                         +----------+--------+--------+--------+--------+------------------+ +----------+--------+-------+----------------+------------+           PSV cm/sEDV cmsDescribe        Arm Pressure +----------+--------+-------+----------------+------------+ [id number]            Multiphasic, WNL             +----------+--------+-------+----------------+------------+ +---------+--------+--+--------+--+---------+ VertebralPSV cm/s27EDV cm/s13Antegrade +---------+--------+--+--------+--+---------+ Left Carotid Findings: +----------+--------+--------+--------+--------+------------------+           PSV cm/sEDV cm/sStenosisDescribeComments           +----------+--------+--------+--------+--------+------------------+ CCA Prox  95      24                                         +----------+--------+--------+--------+--------+------------------+ CCA Distal115     31                      intimal thickening +----------+--------+--------+--------+--------+------------------+ ICA Prox  79      30                      intimal thickening +----------+--------+--------+--------+--------+------------------+ ICA Distal81      33                                         +----------+--------+--------+--------+--------+------------------+ ECA       144     12                                         +----------+--------+--------+--------+--------+------------------+ +----------+--------+--------+----------------+------------+ SubclavianPSV cm/sEDV cm/sDescribe        Arm Pressure +----------+--------+--------+----------------+------------+           143  Multiphasic, WNL              +----------+--------+--------+----------------+------------+ +---------+--------+--+--------+--+---------+ VertebralPSV cm/s39EDV cm/s17Antegrade +---------+--------+--+--------+--+---------+  ABI Findings: +--------+------------------+-----+---------+--------+ Right   Rt Pressure (mmHg)IndexWaveform Comment  +--------+------------------+-----+---------+--------+ Brachial                       triphasic         +--------+------------------+-----+---------+--------+ ATA                            triphasic         +--------+------------------+-----+---------+--------+ PTA                            triphasic         +--------+------------------+-----+---------+--------+ +--------+------------------+-----+---------+-------+ Left    Lt Pressure (mmHg)IndexWaveform Comment +--------+------------------+-----+---------+-------+ Brachial                       triphasic        +--------+------------------+-----+---------+-------+ ATA                            triphasic        +--------+------------------+-----+---------+-------+ PTA                            triphasic        +--------+------------------+-----+---------+-------+  Right Doppler Findings: +--------+--------+-----+---------+--------+ Site    PressureIndexDoppler  Comments +--------+--------+-----+---------+--------+ Brachial             triphasic         +--------+--------+-----+---------+--------+ Radial               biphasic          +--------+--------+-----+---------+--------+ Ulnar                biphasic          +--------+--------+-----+---------+--------+  Left Doppler Findings: +--------+--------+-----+---------+--------+ Site    PressureIndexDoppler  Comments +--------+--------+-----+---------+--------+ Brachial             triphasic         +--------+--------+-----+---------+--------+   Summary: Right Carotid: Velocities in the right ICA are consistent with a  1-39% stenosis. Left Carotid: Velocities in the left ICA are consistent with a 1-39% stenosis. Vertebrals: Bilateral vertebral arteries demonstrate antegrade flow. Right ABI: Normal waveforms. Left ABI: Normal waveforms. Right Upper Extremity: Doppler waveforms remain within normal limits with right radial compression. Doppler waveform obliterate with right ulnar compression. Left Upper Extremity: Doppler waveforms remain within normal limits with left radial compression. Doppler waveforms remain within normal limits with left ulnar compression.  Electronically signed by Heath Lark on 02/18/2022 at 4:34:15 PM.    Final    CT chest w/o contrast  Result Date: 02/18/2022 CLINICAL DATA:  Preop evaluation prior to CABG. EXAM: CT CHEST WITHOUT CONTRAST TECHNIQUE: Multidetector CT imaging of the chest was performed following the standard protocol without IV contrast. RADIATION DOSE REDUCTION: This exam was performed according to the departmental dose-optimization program which includes automated exposure control, adjustment of the mA and/or kV according to patient size and/or use of iterative reconstruction technique. COMPARISON:  None Available. FINDINGS: Cardiovascular: No significant vascular findings. Normal heart size. No pericardial effusion. Thoracic aortic atherosclerosis. Coronary artery atherosclerosis with dense calcification in the LAD and a LAD stent. Mediastinum/Nodes: No enlarged mediastinal or axillary lymph  nodes. Thyroid gland, trachea, and esophagus demonstrate no significant findings. Small hiatal hernia. Lungs/Pleura: Lungs are clear. No pleural effusion or pneumothorax. Bilateral upper paramediastinal blebs. Upper Abdomen: No acute abnormality. Musculoskeletal: No chest wall mass or suspicious bone lesions identified. IMPRESSION: 1. No acute intrathoracic findings. 2. Coronary artery atherosclerosis with dense calcification in the LAD and a LAD stent. 3. Small hiatal hernia. 4.  Aortic  Atherosclerosis (ICD10-I70.0). Electronically Signed   By: Elige Ko M.D.   On: 02/18/2022 12:14   ECHOCARDIOGRAM LIMITED  Result Date: 02/18/2022    ECHOCARDIOGRAM REPORT   Patient Name:   LUISMIGUEL LAMERE Date of Exam: 02/18/2022 Medical Rec #:  [id number]        Height:       72.0 in Accession #:    [id number]       Weight:       222.4 lb Date of Birth:  1975/04/15       BSA:          2.229 m Patient Age:    46 years         BP:           138/81 mmHg Patient Gender: M                HR:           68 bpm. Exam Location:  Inpatient Procedure: Limited Echo, Color Doppler and Cardiac Doppler Indications:    other cardiac sounds  History:        Patient has prior history of Echocardiogram examinations, most                 recent 02/12/2022. CAD, Signs/Symptoms:Chest Pain; Risk                 Factors:Current Smoker, Hypertension and Dyslipidemia.  Sonographer:    Delcie Roch RDCS Referring Phys: [id number] DANIEL H ENTER IMPRESSIONS  1. Small area of distal septal/inferior basal hypokinesis . Left ventricular ejection fraction, by estimation, is 55%. The left ventricle has normal function. The left ventricle has no regional wall motion abnormalities. There is mild left ventricular hypertrophy. Left ventricular diastolic parameters are consistent with Grade I diastolic dysfunction (impaired relaxation).  2. Right ventricular systolic function is normal. The right ventricular size is normal.  3. The mitral valve is abnormal. Mild mitral valve regurgitation. No evidence of mitral stenosis.  4. The aortic valve is tricuspid. Aortic valve regurgitation is not visualized. No aortic stenosis is present.  5. The inferior vena cava is normal in size with greater than 50% respiratory variability, suggesting right atrial pressure of 3 mmHg. FINDINGS  Left Ventricle: Small area of distal septal/inferior basal hypokinesis. Left ventricular ejection fraction, by estimation, is 55%. The left ventricle has normal  function. The left ventricle has no regional wall motion abnormalities. The left ventricular  internal cavity size was normal in size. There is mild left ventricular hypertrophy. Left ventricular diastolic parameters are consistent with Grade I diastolic dysfunction (impaired relaxation). Right Ventricle: The right ventricular size is normal. No increase in right ventricular wall thickness. Right ventricular systolic function is normal. Left Atrium: Left atrial size was normal in size. Right Atrium: Right atrial size was normal in size. Pericardium: There is no evidence of pericardial effusion. Mitral Valve: The mitral valve is abnormal. There is mild thickening of the mitral valve leaflet(s). Mild mitral annular calcification. Mild mitral valve regurgitation. No evidence of mitral valve stenosis. Tricuspid Valve: The tricuspid valve is  normal in structure. Tricuspid valve regurgitation is not demonstrated. No evidence of tricuspid stenosis. Aortic Valve: The aortic valve is tricuspid. Aortic valve regurgitation is not visualized. No aortic stenosis is present. Pulmonic Valve: The pulmonic valve was normal in structure. Pulmonic valve regurgitation is not visualized. No evidence of pulmonic stenosis. Aorta: The aortic root is normal in size and structure. Venous: The inferior vena cava is normal in size with greater than 50% respiratory variability, suggesting right atrial pressure of 3 mmHg. IAS/Shunts: No atrial level shunt detected by color flow Doppler.  LEFT VENTRICLE PLAX 2D LVIDd:         4.60 cm     Diastology LVIDs:         3.40 cm     LV e' medial:  5.33 cm/s LV PW:         1.10 cm     LV e' lateral: 7.51 cm/s LV IVS:        1.20 cm  LV Volumes (MOD) LV vol d, MOD A2C: 82.3 ml LV vol s, MOD A2C: 35.6 ml LV SV MOD A2C:     46.7 ml IVC IVC diam: 1.30 cm LEFT ATRIUM         Index LA diam:    3.60 cm 1.62 cm/m  AORTIC VALVE LVOT Vmax:   111.00 cm/s LVOT Vmean:  73.900 cm/s LVOT VTI:    0.207 m  AORTA Ao Asc  diam: 3.10 cm  SHUNTS Systemic VTI: 0.21 m Charlton Haws MD Electronically signed by Charlton Haws MD Signature Date/Time: 02/18/2022/11:59:28 AM    Final      Assessment/Plan:  Hyperkalemia - occurred within an hour of his procedure and before he was on cardiopulmonary bypass and caused a brief bradycardic arrest.  His K peaked at 7.3 after 3 hours of the procedure and slowly improved to 5 at the end.  All of these potassium levels were on an I-stat and bmet is pending (although likely real given his bradycardic arrest).  Unclear if this was related to anesthesia, ischemia during case, or IVF's used.  He did receive FFP, platelets, and RBC's.  He was also given Plasmalyte A which has 5 mEq of potassium and can cause hyperkalemia especially in patients with CHF and CKD.  He also received tranexamic acid which has been associated with hyperkalemia.  No ACE/ARB or K-sparing diuretic.  Would also check LDH to r/o hemolysis and CK level to r/o rhabdo.  He has been treated appropriately and hopefully K will return to normal.  Continue with Lokelma 10 gm bid and follow.  No indication for dialysis at this time as he has had a significant diuresis after one dose of IV lasix 20 mg.  Will continue to follow.  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  Currently on vasopressin and epi drip. Bradycardia - presumably due to hyperkalemia now being paced VDRF - per PCCM AKI on CKD stage IIIa - Scr bumped slightly after bradycardic arrest but good UOP.  Continue to follow. ABLA - transfused and follow Cocaine abuse - will need counseling on cessation.     Julien Nordmann Saivon Prowse 02/19/2022, 7:37 PM

## 2022-02-19 NOTE — Progress Notes (Deleted)
OPERATIVE NOTE: Patient Name: Logan Crawford Date of Birth: 05-05-1975 Date of Operation: 02/19/22  PRE-OPERATIVE DIAGNOSIS: Coronary artery disease Ischemic cardiomyopathy NSTEMI Polysubstance use Tobacco use Hypertension Prior PCI  POST-OPERATIVE DIAGNOSIS: Same  OPERATION: CABG x 3 (LIMA - Diag, SVG - LAD, SVG - RCA) Endoscopic saphenous vein harvest, right leg Insertion of intraaortic balloon pump, right common femoral artery  SURGEON: Waverly Ferrari Youssouf Shipley MD   ASSISTANT: Jillyn Hidden PA  EBL 100cc  FINDINGS: EF reduced before surgery EF reduced after surgery   Poor quality vein conduits Poor quality IMA conduit LAD 1.37mm Diag 1.25 mm, flow 45 cc/min PDA 1.25 mm, flow 20 cc/min   TIMES: XC: 104 min CPB:  262 min   SPECIMENS None   COMPLICATIONS: Necessity of Intra-aortic Balloon Pump with decreased LV function  TUBES:  3 24 Fr blakes (bilateral pleural and mediastinal) 1 JP to Bulb (mediastinal)   PROCEDURE IN DETAIL: The patient was brought in the operating room and laid in supine position.  The patient was prepped and draped in standard fashion.  An arterial, pulmonary arterial and venous lines were placed by anesthesia along with a single-lumen endotracheal tube. Vein was harvested out of the right leg endoscopically by the physician assistant. Local analgesia given to the sternum. Sternotomy was performed and LIMA taken down after 5000u heparin given. Flow through the artery was below average but adequate. Pericardium was opened.  Full dose heparin was given to achieve an ACT of 480 and aortic cannula inserted. Suddenly, the patient went into complete heart block. Potassium was noted to be 6.5 to 6.8 at this time, up from around 4.5. Venous cannulas were inserted.The patient was placed on cardiopulmonary bypass urgently and a ventricular wire placed to get the left heart vented. Using a cross-clamp and cardiac arrest was achieved with 1.2  L of  modified blood [place] cardioplegia antegrade. Topical cooling was used as well on the heart.  RCA sewn distally with 7-0 prolene, then proximal with 6-0 prolene. LIMA evaluated and it had poor flow initially, whereas flow had been adequate when it taken down. I placed a small 33mm probe through it retrograde and used paperverine. Flow improved to adequate. I still had concern regarding it. I then Placed the LIMA to Diag with 7-0 prolene. LAD sewn distally with 7-0 prolene, then proximal to the aorta with 6-0 prolene. I used two components of vein in order to have vein of sufficient size in continuity. This was completed with 6-0 prolene and attention paid that was in proper reverse orientation   Next, the cross-clamp was released  with the root vent on.  After de-airing the heart came back into regular sinus rhythm and the heart was not able to take over the circulation. The potassium was around 7 at this point, still too high wean off. We were using further filtering techniques through the cardiopulmonary bypass pump as well which necessitated at least an extra hour plus on bypass. The heart function was reduced more than prior. I evaluated grafts with doppler and they were adequate. I placed a needle and wire in the right common femoral artery and ensured on the intra-aortic balloon pump was in reasonable location by TEE. It was placed on and 10 of epi and 10 of levo along with 0.25 milrinone started.  Bypass was weaned and cannulas were removed, then protamine was given and hemostasis was achieved. Chest tubes were placed as above along with ventricular and atrial wires.  The chest  was then closed in interrupted steel wire and the presternal layers were closed in 3 layers of absorbable suture.  The patient had a guarded status and was transferred to the postoperative care unit on 10 of epinephrine and 6 of Levophed and 0.125 milrinone in sinus around 80 and on an intra-aortic balloon pump. All surgical counts  were correct.

## 2022-02-19 NOTE — Anesthesia Procedure Notes (Signed)
Procedure Name: Intubation Date/Time: 02/19/2022 7:52 AM  Performed by: Barrington Ellison, CRNAPre-anesthesia Checklist: Patient identified, Emergency Drugs available, Suction available and Patient being monitored Patient Re-evaluated:Patient Re-evaluated prior to induction Oxygen Delivery Method: Circle System Utilized Preoxygenation: Pre-oxygenation with 100% oxygen Induction Type: IV induction Ventilation: Mask ventilation without difficulty, Oral airway inserted - appropriate to patient size and Two handed mask ventilation required Laryngoscope Size: Mac and 4 Grade View: Grade I Tube type: Oral Number of attempts: 2 Airway Equipment and Method: Stylet and Oral airway Placement Confirmation: ETT inserted through vocal cords under direct vision, positive ETCO2 and breath sounds checked- equal and bilateral Tube secured with: Tape Dental Injury: Teeth and Oropharynx as per pre-operative assessment  Comments: 1st attempt grace 2, entered esophagus. Attempt 2, grade 1, great view

## 2022-02-19 NOTE — Anesthesia Procedure Notes (Signed)
Central Venous Catheter Insertion Performed by: Murvin Natal, MD, anesthesiologist Start/End12/27/2023 7:00 AM, 02/19/2022 7:20 AM Patient location: Pre-op. Preanesthetic checklist: patient identified, IV checked, site marked, risks and benefits discussed, surgical consent, monitors and equipment checked, pre-op evaluation, timeout performed and anesthesia consent Position: Trendelenburg Lidocaine 1% used for infiltration and patient sedated Hand hygiene performed , maximum sterile barriers used  and Seldinger technique used Catheter size: 9 Fr Total catheter length 12. PA cath was placed.MAC introducer Swan type:thermodilution PA Cath depth:49 Procedure performed using ultrasound guided technique. Ultrasound Notes:anatomy identified, needle tip was noted to be adjacent to the nerve/plexus identified and no ultrasound evidence of intravascular and/or intraneural injection Attempts: 1 Following insertion, line sutured, dressing applied and Biopatch. Post procedure assessment: blood return through all ports, free fluid flow and no air  Patient tolerated the procedure well with no immediate complications.

## 2022-02-19 NOTE — Anesthesia Preprocedure Evaluation (Addendum)
Anesthesia Evaluation  Patient identified by MRN, date of birth, ID band Patient awake    Reviewed: Allergy & Precautions, NPO status , Patient's Chart, lab work & pertinent test results, reviewed documented beta blocker date and time   Airway Mallampati: II  TM Distance: >3 FB Neck ROM: Full    Dental no notable dental hx.    Pulmonary former smoker   Pulmonary exam normal        Cardiovascular hypertension, + angina  + CAD, + Past MI, + Cardiac Stents and +CHF  Normal cardiovascular exam     Neuro/Psych negative neurological ROS     GI/Hepatic negative GI ROS,,,(+)     substance abuse    Endo/Other  negative endocrine ROS    Renal/GU Renal disease     Musculoskeletal negative musculoskeletal ROS (+)    Abdominal   Peds  Hematology negative hematology ROS (+)   Anesthesia Other Findings CAD  Reproductive/Obstetrics                             Anesthesia Physical Anesthesia Plan  ASA: 4  Anesthesia Plan: General   Post-op Pain Management:    Induction: Intravenous  PONV Risk Score and Plan: 2 and Ondansetron, Dexamethasone, Midazolam and Treatment may vary due to age or medical condition  Airway Management Planned: Oral ETT  Additional Equipment: Arterial line, CVP, PA Cath, TEE and Ultrasound Guidance Line Placement  Intra-op Plan:   Post-operative Plan: Post-operative intubation/ventilation  Informed Consent: I have reviewed the patients History and Physical, chart, labs and discussed the procedure including the risks, benefits and alternatives for the proposed anesthesia with the patient or authorized representative who has indicated his/her understanding and acceptance.     Dental advisory given  Plan Discussed with: CRNA  Anesthesia Plan Comments:        Anesthesia Quick Evaluation

## 2022-02-20 ENCOUNTER — Inpatient Hospital Stay (HOSPITAL_COMMUNITY): Payer: Medicaid Other

## 2022-02-20 ENCOUNTER — Other Ambulatory Visit: Payer: Self-pay

## 2022-02-20 ENCOUNTER — Encounter (HOSPITAL_COMMUNITY): Payer: Self-pay | Admitting: Cardiothoracic Surgery

## 2022-02-20 ENCOUNTER — Other Ambulatory Visit (HOSPITAL_COMMUNITY): Payer: Self-pay

## 2022-02-20 DIAGNOSIS — J90 Pleural effusion, not elsewhere classified: Secondary | ICD-10-CM

## 2022-02-20 DIAGNOSIS — N179 Acute kidney failure, unspecified: Secondary | ICD-10-CM

## 2022-02-20 DIAGNOSIS — R570 Cardiogenic shock: Secondary | ICD-10-CM

## 2022-02-20 DIAGNOSIS — N189 Chronic kidney disease, unspecified: Secondary | ICD-10-CM

## 2022-02-20 HISTORY — DX: Pleural effusion, not elsewhere classified: J90

## 2022-02-20 LAB — POCT I-STAT 7, (LYTES, BLD GAS, ICA,H+H)
Acid-base deficit: 5 mmol/L — ABNORMAL HIGH (ref 0.0–2.0)
Acid-base deficit: 5 mmol/L — ABNORMAL HIGH (ref 0.0–2.0)
Acid-base deficit: 5 mmol/L — ABNORMAL HIGH (ref 0.0–2.0)
Acid-base deficit: 6 mmol/L — ABNORMAL HIGH (ref 0.0–2.0)
Acid-base deficit: 7 mmol/L — ABNORMAL HIGH (ref 0.0–2.0)
Acid-base deficit: 7 mmol/L — ABNORMAL HIGH (ref 0.0–2.0)
Bicarbonate: 17.6 mmol/L — ABNORMAL LOW (ref 20.0–28.0)
Bicarbonate: 19.5 mmol/L — ABNORMAL LOW (ref 20.0–28.0)
Bicarbonate: 19.7 mmol/L — ABNORMAL LOW (ref 20.0–28.0)
Bicarbonate: 19.7 mmol/L — ABNORMAL LOW (ref 20.0–28.0)
Bicarbonate: 20.9 mmol/L (ref 20.0–28.0)
Bicarbonate: 21.4 mmol/L (ref 20.0–28.0)
Calcium, Ion: 0.98 mmol/L — ABNORMAL LOW (ref 1.15–1.40)
Calcium, Ion: 1 mmol/L — ABNORMAL LOW (ref 1.15–1.40)
Calcium, Ion: 1.02 mmol/L — ABNORMAL LOW (ref 1.15–1.40)
Calcium, Ion: 1.04 mmol/L — ABNORMAL LOW (ref 1.15–1.40)
Calcium, Ion: 1.06 mmol/L — ABNORMAL LOW (ref 1.15–1.40)
Calcium, Ion: 1.14 mmol/L — ABNORMAL LOW (ref 1.15–1.40)
HCT: 22 % — ABNORMAL LOW (ref 39.0–52.0)
HCT: 25 % — ABNORMAL LOW (ref 39.0–52.0)
HCT: 26 % — ABNORMAL LOW (ref 39.0–52.0)
HCT: 31 % — ABNORMAL LOW (ref 39.0–52.0)
HCT: 33 % — ABNORMAL LOW (ref 39.0–52.0)
HCT: 34 % — ABNORMAL LOW (ref 39.0–52.0)
Hemoglobin: 10.5 g/dL — ABNORMAL LOW (ref 13.0–17.0)
Hemoglobin: 11.2 g/dL — ABNORMAL LOW (ref 13.0–17.0)
Hemoglobin: 11.6 g/dL — ABNORMAL LOW (ref 13.0–17.0)
Hemoglobin: 7.5 g/dL — ABNORMAL LOW (ref 13.0–17.0)
Hemoglobin: 8.5 g/dL — ABNORMAL LOW (ref 13.0–17.0)
Hemoglobin: 8.8 g/dL — ABNORMAL LOW (ref 13.0–17.0)
O2 Saturation: 100 %
O2 Saturation: 89 %
O2 Saturation: 91 %
O2 Saturation: 94 %
O2 Saturation: 99 %
O2 Saturation: 99 %
Patient temperature: 36.1
Patient temperature: 36.3
Patient temperature: 36.4
Patient temperature: 36.6
Patient temperature: 36.8
Patient temperature: 37.4
Potassium: 5.1 mmol/L (ref 3.5–5.1)
Potassium: 5.4 mmol/L — ABNORMAL HIGH (ref 3.5–5.1)
Potassium: 5.8 mmol/L — ABNORMAL HIGH (ref 3.5–5.1)
Potassium: 6.3 mmol/L (ref 3.5–5.1)
Potassium: 6.5 mmol/L (ref 3.5–5.1)
Potassium: 6.9 mmol/L (ref 3.5–5.1)
Sodium: 135 mmol/L (ref 135–145)
Sodium: 135 mmol/L (ref 135–145)
Sodium: 137 mmol/L (ref 135–145)
Sodium: 137 mmol/L (ref 135–145)
Sodium: 138 mmol/L (ref 135–145)
Sodium: 139 mmol/L (ref 135–145)
TCO2: 19 mmol/L — ABNORMAL LOW (ref 22–32)
TCO2: 20 mmol/L — ABNORMAL LOW (ref 22–32)
TCO2: 21 mmol/L — ABNORMAL LOW (ref 22–32)
TCO2: 21 mmol/L — ABNORMAL LOW (ref 22–32)
TCO2: 22 mmol/L (ref 22–32)
TCO2: 23 mmol/L (ref 22–32)
pCO2 arterial: 31.5 mmHg — ABNORMAL LOW (ref 32–48)
pCO2 arterial: 31.8 mmHg — ABNORMAL LOW (ref 32–48)
pCO2 arterial: 35.1 mmHg (ref 32–48)
pCO2 arterial: 36.2 mmHg (ref 32–48)
pCO2 arterial: 45.7 mmHg (ref 32–48)
pCO2 arterial: 50.7 mmHg — ABNORMAL HIGH (ref 32–48)
pH, Arterial: 7.221 — ABNORMAL LOW (ref 7.35–7.45)
pH, Arterial: 7.275 — ABNORMAL LOW (ref 7.35–7.45)
pH, Arterial: 7.344 — ABNORMAL LOW (ref 7.35–7.45)
pH, Arterial: 7.348 — ABNORMAL LOW (ref 7.35–7.45)
pH, Arterial: 7.352 (ref 7.35–7.45)
pH, Arterial: 7.398 (ref 7.35–7.45)
pO2, Arterial: 134 mmHg — ABNORMAL HIGH (ref 83–108)
pO2, Arterial: 134 mmHg — ABNORMAL HIGH (ref 83–108)
pO2, Arterial: 183 mmHg — ABNORMAL HIGH (ref 83–108)
pO2, Arterial: 67 mmHg — ABNORMAL LOW (ref 83–108)
pO2, Arterial: 68 mmHg — ABNORMAL LOW (ref 83–108)
pO2, Arterial: 72 mmHg — ABNORMAL LOW (ref 83–108)

## 2022-02-20 LAB — BASIC METABOLIC PANEL
Anion gap: 7 (ref 5–15)
Anion gap: 10 (ref 5–15)
Anion gap: 11 (ref 5–15)
Anion gap: 13 (ref 5–15)
Anion gap: 8 (ref 5–15)
BUN: 15 mg/dL (ref 6–20)
BUN: 18 mg/dL (ref 6–20)
BUN: 20 mg/dL (ref 6–20)
BUN: 23 mg/dL — ABNORMAL HIGH (ref 6–20)
BUN: 23 mg/dL — ABNORMAL HIGH (ref 6–20)
CO2: 20 mmol/L — ABNORMAL LOW (ref 22–32)
CO2: 22 mmol/L (ref 22–32)
CO2: 21 mmol/L — ABNORMAL LOW (ref 22–32)
CO2: 20 mmol/L — ABNORMAL LOW (ref 22–32)
CO2: 21 mmol/L — ABNORMAL LOW (ref 22–32)
Calcium: 7.2 mg/dL — ABNORMAL LOW (ref 8.9–10.3)
Calcium: 7.4 mg/dL — ABNORMAL LOW (ref 8.9–10.3)
Calcium: 7.9 mg/dL — ABNORMAL LOW (ref 8.9–10.3)
Calcium: 8.2 mg/dL — ABNORMAL LOW (ref 8.9–10.3)
Calcium: 8 mg/dL — ABNORMAL LOW (ref 8.9–10.3)
Chloride: 110 mmol/L (ref 98–111)
Chloride: 105 mmol/L (ref 98–111)
Chloride: 105 mmol/L (ref 98–111)
Chloride: 104 mmol/L (ref 98–111)
Chloride: 106 mmol/L (ref 98–111)
Creatinine, Ser: 2.18 mg/dL — ABNORMAL HIGH (ref 0.61–1.24)
Creatinine, Ser: 2.34 mg/dL — ABNORMAL HIGH (ref 0.61–1.24)
Creatinine, Ser: 2.53 mg/dL — ABNORMAL HIGH (ref 0.61–1.24)
Creatinine, Ser: 2.61 mg/dL — ABNORMAL HIGH (ref 0.61–1.24)
Creatinine, Ser: 2.53 mg/dL — ABNORMAL HIGH (ref 0.61–1.24)
GFR, Estimated: 36 mL/min — ABNORMAL LOW (ref >60)
GFR, Estimated: 33 mL/min — ABNORMAL LOW (ref >60)
GFR, Estimated: 30 mL/min — ABNORMAL LOW (ref >60)
GFR, Estimated: 29 mL/min — ABNORMAL LOW (ref >60)
GFR, Estimated: 30 mL/min — ABNORMAL LOW (ref >60)
Glucose, Bld: 143 mg/dL — ABNORMAL HIGH (ref 70–99)
Glucose, Bld: 153 mg/dL — ABNORMAL HIGH (ref 70–99)
Glucose, Bld: 123 mg/dL — ABNORMAL HIGH (ref 70–99)
Glucose, Bld: 148 mg/dL — ABNORMAL HIGH (ref 70–99)
Glucose, Bld: 151 mg/dL — ABNORMAL HIGH (ref 70–99)
Potassium: 5.1 mmol/L (ref 3.5–5.1)
Potassium: 6.1 mmol/L — ABNORMAL HIGH (ref 3.5–5.1)
Potassium: 5.4 mmol/L — ABNORMAL HIGH (ref 3.5–5.1)
Potassium: 4.8 mmol/L (ref 3.5–5.1)
Potassium: 4.3 mmol/L (ref 3.5–5.1)
Sodium: 137 mmol/L (ref 135–145)
Sodium: 137 mmol/L (ref 135–145)
Sodium: 137 mmol/L (ref 135–145)
Sodium: 137 mmol/L (ref 135–145)
Sodium: 135 mmol/L (ref 135–145)

## 2022-02-20 LAB — BPAM FFP
Blood Product Expiration Date: 202312292359
Blood Product Expiration Date: 202312292359
ISSUE DATE / TIME: 202312271449
ISSUE DATE / TIME: 202312271449
Unit Type and Rh: 6200
Unit Type and Rh: 6200

## 2022-02-20 LAB — LACTATE DEHYDROGENASE: LDH: 312 U/L — ABNORMAL HIGH (ref 98–192)

## 2022-02-20 LAB — COOXEMETRY PANEL
Carboxyhemoglobin: 0.6 % (ref 0.5–1.5)
Carboxyhemoglobin: 0.8 % (ref 0.5–1.5)
Carboxyhemoglobin: 1.4 % (ref 0.5–1.5)
Carboxyhemoglobin: 1.5 % (ref 0.5–1.5)
Carboxyhemoglobin: 1.5 % (ref 0.5–1.5)
Methemoglobin: 0.7 % (ref 0.0–1.5)
Methemoglobin: 0.7 % (ref 0.0–1.5)
Methemoglobin: 0.8 % (ref 0.0–1.5)
Methemoglobin: <0.7 % (ref 0.0–1.5)
Methemoglobin: <0.7 % (ref 0.0–1.5)
O2 Saturation: 38.6 %
O2 Saturation: 44.8 %
O2 Saturation: 53.9 %
O2 Saturation: 55.7 %
O2 Saturation: 60.5 %
Total hemoglobin: 7.6 g/dL — ABNORMAL LOW (ref 12.0–16.0)
Total hemoglobin: 8.6 g/dL — ABNORMAL LOW (ref 12.0–16.0)
Total hemoglobin: 8.6 g/dL — ABNORMAL LOW (ref 12.0–16.0)
Total hemoglobin: 8.6 g/dL — ABNORMAL LOW (ref 12.0–16.0)
Total hemoglobin: 8.9 g/dL — ABNORMAL LOW (ref 12.0–16.0)

## 2022-02-20 LAB — LACTIC ACID, PLASMA: Lactic Acid, Venous: 2.6 mmol/L (ref 0.5–1.9)

## 2022-02-20 LAB — CBC
HCT: 24.9 % — ABNORMAL LOW (ref 39.0–52.0)
HCT: 22.9 % — ABNORMAL LOW (ref 39.0–52.0)
HCT: 26 % — ABNORMAL LOW (ref 39.0–52.0)
Hemoglobin: 8.7 g/dL — ABNORMAL LOW (ref 13.0–17.0)
Hemoglobin: 7.9 g/dL — ABNORMAL LOW (ref 13.0–17.0)
Hemoglobin: 8.7 g/dL — ABNORMAL LOW (ref 13.0–17.0)
MCH: 31.5 pg (ref 26.0–34.0)
MCH: 31.1 pg (ref 26.0–34.0)
MCH: 30.3 pg (ref 26.0–34.0)
MCHC: 34.9 g/dL (ref 30.0–36.0)
MCHC: 34.5 g/dL (ref 30.0–36.0)
MCHC: 33.5 g/dL (ref 30.0–36.0)
MCV: 90.2 fL (ref 80.0–100.0)
MCV: 90.2 fL (ref 80.0–100.0)
MCV: 90.6 fL (ref 80.0–100.0)
Platelets: 154 10*3/uL (ref 150–400)
Platelets: 148 10*3/uL — ABNORMAL LOW (ref 150–400)
Platelets: 134 10*3/uL — ABNORMAL LOW (ref 150–400)
RBC: 2.76 MIL/uL — ABNORMAL LOW (ref 4.22–5.81)
RBC: 2.54 MIL/uL — ABNORMAL LOW (ref 4.22–5.81)
RBC: 2.87 MIL/uL — ABNORMAL LOW (ref 4.22–5.81)
RDW: 14.9 % (ref 11.5–15.5)
RDW: 15.1 % (ref 11.5–15.5)
RDW: 15.5 % (ref 11.5–15.5)
WBC: 11.3 10*3/uL — ABNORMAL HIGH (ref 4.0–10.5)
WBC: 11.3 10*3/uL — ABNORMAL HIGH (ref 4.0–10.5)
WBC: 12.9 10*3/uL — ABNORMAL HIGH (ref 4.0–10.5)
nRBC: 0 % (ref 0.0–0.2)
nRBC: 0 % (ref 0.0–0.2)
nRBC: 0 % (ref 0.0–0.2)

## 2022-02-20 LAB — GLUCOSE, CAPILLARY
Glucose-Capillary: 133 mg/dL — ABNORMAL HIGH (ref 70–99)
Glucose-Capillary: 138 mg/dL — ABNORMAL HIGH (ref 70–99)
Glucose-Capillary: 140 mg/dL — ABNORMAL HIGH (ref 70–99)
Glucose-Capillary: 146 mg/dL — ABNORMAL HIGH (ref 70–99)
Glucose-Capillary: 146 mg/dL — ABNORMAL HIGH (ref 70–99)
Glucose-Capillary: 151 mg/dL — ABNORMAL HIGH (ref 70–99)
Glucose-Capillary: 151 mg/dL — ABNORMAL HIGH (ref 70–99)
Glucose-Capillary: 156 mg/dL — ABNORMAL HIGH (ref 70–99)
Glucose-Capillary: 156 mg/dL — ABNORMAL HIGH (ref 70–99)
Glucose-Capillary: 157 mg/dL — ABNORMAL HIGH (ref 70–99)
Glucose-Capillary: 157 mg/dL — ABNORMAL HIGH (ref 70–99)
Glucose-Capillary: 161 mg/dL — ABNORMAL HIGH (ref 70–99)
Glucose-Capillary: 163 mg/dL — ABNORMAL HIGH (ref 70–99)
Glucose-Capillary: 165 mg/dL — ABNORMAL HIGH (ref 70–99)

## 2022-02-20 LAB — BPAM PLATELET PHERESIS
Blood Product Expiration Date: 202312282359
ISSUE DATE / TIME: 202312271527
Unit Type and Rh: 5100

## 2022-02-20 LAB — MAGNESIUM
Magnesium: 2.7 mg/dL — ABNORMAL HIGH (ref 1.7–2.4)
Magnesium: 2.5 mg/dL — ABNORMAL HIGH (ref 1.7–2.4)
Magnesium: 2.5 mg/dL — ABNORMAL HIGH (ref 1.7–2.4)

## 2022-02-20 LAB — CK: Total CK: 1249 U/L — ABNORMAL HIGH (ref 49–397)

## 2022-02-20 LAB — PREPARE FRESH FROZEN PLASMA: Unit division: 0

## 2022-02-20 LAB — PREPARE PLATELET PHERESIS: Unit division: 0

## 2022-02-20 LAB — PREPARE RBC (CROSSMATCH)

## 2022-02-20 LAB — POTASSIUM
Potassium: 5.5 mmol/L — ABNORMAL HIGH (ref 3.5–5.1)
Potassium: 5.1 mmol/L (ref 3.5–5.1)

## 2022-02-20 MED ORDER — VASOPRESSIN 20 UNITS/100 ML INFUSION FOR SHOCK
INTRAVENOUS | Status: AC
  Administered 2022-02-20: 0.02 [IU]/min via INTRAVENOUS
  Filled 2022-02-20: qty 100

## 2022-02-20 MED ORDER — CALCIUM GLUCONATE-NACL 1-0.675 GM/50ML-% IV SOLN: 1.0000 g | Freq: Once | INTRAVENOUS | Status: AC

## 2022-02-20 MED ORDER — EPINEPHRINE HCL 5 MG/250ML IV SOLN IN NS
INTRAVENOUS | Status: AC
  Administered 2022-02-20: 5 ug/min via INTRAVENOUS
  Filled 2022-02-20: qty 250

## 2022-02-20 MED ORDER — SODIUM BICARBONATE 8.4 % IV SOLN: 100.0000 meq | Freq: Once | INTRAVENOUS | Status: AC

## 2022-02-20 MED ORDER — SODIUM ZIRCONIUM CYCLOSILICATE 10 G PO PACK
10.0000 g | PACK | Freq: Two times a day (BID) | ORAL | Status: DC
  Administered 2022-02-20: 10 g
  Filled 2022-02-20: qty 1

## 2022-02-20 MED ORDER — SODIUM CHLORIDE 0.9% IV SOLUTION: Freq: Once | INTRAVENOUS | Status: AC

## 2022-02-20 MED ORDER — ATORVASTATIN CALCIUM 80 MG PO TABS
80.0000 mg | ORAL_TABLET | Freq: Every day | ORAL | Status: DC
  Administered 2022-02-20: 80 mg
  Filled 2022-02-20: qty 1

## 2022-02-20 MED ORDER — CALCIUM GLUCONATE-NACL 1-0.675 GM/50ML-% IV SOLN
INTRAVENOUS | Status: AC
  Administered 2022-02-20: 1000 mg via INTRAVENOUS
  Filled 2022-02-20: qty 100

## 2022-02-20 MED ORDER — CALCIUM GLUCONATE-NACL 1-0.675 GM/50ML-% IV SOLN
1.0000 g | Freq: Once | INTRAVENOUS | Status: AC
  Administered 2022-02-20: 1000 mg via INTRAVENOUS

## 2022-02-20 MED ORDER — NOREPINEPHRINE 4 MG/250ML-% IV SOLN
0.0000 ug/min | INTRAVENOUS | Status: DC
  Administered 2022-02-20: 14 ug/min via INTRAVENOUS

## 2022-02-20 MED ORDER — PANTOPRAZOLE SODIUM 40 MG IV SOLR
40.0000 mg | INTRAVENOUS | Status: DC
  Administered 2022-02-20 – 2022-02-23 (×4): 40 mg via INTRAVENOUS
  Filled 2022-02-20 (×4): qty 10

## 2022-02-20 MED ORDER — FUROSEMIDE 10 MG/ML IJ SOLN
20.0000 mg | Freq: Four times a day (QID) | INTRAMUSCULAR | Status: DC
  Administered 2022-02-20 (×2): 20 mg via INTRAVENOUS
  Filled 2022-02-20 (×2): qty 2

## 2022-02-20 MED ORDER — OXYCODONE HCL 5 MG PO TABS
5.0000 mg | ORAL_TABLET | ORAL | Status: DC | PRN
  Administered 2022-02-20 – 2022-02-21 (×4): 10 mg
  Administered 2022-02-24: 5 mg
  Filled 2022-02-20 (×4): qty 2

## 2022-02-20 MED ORDER — SODIUM ZIRCONIUM CYCLOSILICATE 10 G PO PACK
10.0000 g | PACK | Freq: Once | ORAL | Status: AC
  Administered 2022-02-20: 10 g
  Filled 2022-02-20: qty 1

## 2022-02-20 MED ORDER — CALCIUM CHLORIDE 10 % IV SOLN
INTRAVENOUS | Status: AC
  Filled 2022-02-20: qty 20

## 2022-02-20 MED ORDER — VASOPRESSIN 20 UNITS/100 ML INFUSION FOR SHOCK: 0.0000 [IU]/min | INTRAVENOUS | Status: DC

## 2022-02-20 MED ORDER — ENOXAPARIN SODIUM 30 MG/0.3ML IJ SOSY: 30.0000 mg | PREFILLED_SYRINGE | Freq: Every day | INTRAMUSCULAR | Status: DC

## 2022-02-20 MED ORDER — HYDRALAZINE HCL 20 MG/ML IJ SOLN: 10.0000 mg | Freq: Once | INTRAMUSCULAR | Status: DC

## 2022-02-20 MED ORDER — FUROSEMIDE 10 MG/ML IJ SOLN: 60.0000 mg | Freq: Once | INTRAMUSCULAR | Status: DC

## 2022-02-20 MED ORDER — TRAMADOL HCL 50 MG PO TABS
50.0000 mg | ORAL_TABLET | ORAL | Status: DC | PRN
  Administered 2022-02-21: 100 mg
  Filled 2022-02-20: qty 2

## 2022-02-20 MED ORDER — MILRINONE LACTATE IN DEXTROSE 20-5 MG/100ML-% IV SOLN
0.1250 ug/kg/min | INTRAVENOUS | Status: DC
  Administered 2022-02-20 – 2022-02-21 (×4): 0.375 ug/kg/min via INTRAVENOUS
  Administered 2022-02-22 – 2022-02-26 (×6): 0.125 ug/kg/min via INTRAVENOUS
  Filled 2022-02-20 (×8): qty 100

## 2022-02-20 MED ORDER — SODIUM CHLORIDE 0.9 % IV BOLUS: 500.0000 mL | Freq: Once | INTRAVENOUS | Status: DC

## 2022-02-20 MED ORDER — NOREPINEPHRINE 4 MG/250ML-% IV SOLN
INTRAVENOUS | Status: AC
  Filled 2022-02-20: qty 250

## 2022-02-20 MED ORDER — INSULIN ASPART 100 UNIT/ML IJ SOLN
0.0000 [IU] | INTRAMUSCULAR | Status: DC
  Administered 2022-02-20 (×2): 2 [IU] via SUBCUTANEOUS
  Administered 2022-02-20: 4 [IU] via SUBCUTANEOUS
  Administered 2022-02-20 – 2022-02-26 (×10): 2 [IU] via SUBCUTANEOUS

## 2022-02-20 MED ORDER — ALBUMIN HUMAN 5 % IV SOLN
INTRAVENOUS | Status: AC
  Administered 2022-02-20: 12.5 g via INTRAVENOUS
  Filled 2022-02-20: qty 500

## 2022-02-20 MED ORDER — CALCIUM GLUCONATE-NACL 1-0.675 GM/50ML-% IV SOLN
INTRAVENOUS | Status: AC
  Administered 2022-02-20: 1000 mg via INTRAVENOUS
  Filled 2022-02-20: qty 50

## 2022-02-20 MED ORDER — EPINEPHRINE HCL 5 MG/250ML IV SOLN IN NS
0.5000 ug/min | INTRAVENOUS | Status: DC
  Administered 2022-02-20: 3 ug/min via INTRAVENOUS
  Administered 2022-02-22: 1 ug/min via INTRAVENOUS
  Filled 2022-02-20 (×2): qty 250

## 2022-02-20 MED ORDER — GABAPENTIN 250 MG/5ML PO SOLN
100.0000 mg | Freq: Two times a day (BID) | ORAL | Status: AC
  Administered 2022-02-20 – 2022-02-21 (×4): 100 mg
  Filled 2022-02-20 (×4): qty 2

## 2022-02-20 MED ORDER — SODIUM BICARBONATE 8.4 % IV SOLN
INTRAVENOUS | Status: AC
  Administered 2022-02-20: 100 meq via INTRAVENOUS
  Filled 2022-02-20: qty 50

## 2022-02-20 MED ORDER — FUROSEMIDE 10 MG/ML IJ SOLN
60.0000 mg | Freq: Two times a day (BID) | INTRAMUSCULAR | Status: DC
  Administered 2022-02-20 – 2022-02-21 (×2): 60 mg via INTRAVENOUS
  Filled 2022-02-20: qty 6

## 2022-02-20 MED ORDER — DEXMEDETOMIDINE HCL IN NACL 400 MCG/100ML IV SOLN
0.0000 ug/kg/h | INTRAVENOUS | Status: DC
  Administered 2022-02-20: 1.2 ug/kg/h via INTRAVENOUS
  Administered 2022-02-20: 0.9 ug/kg/h via INTRAVENOUS
  Administered 2022-02-20 (×2): 1.2 ug/kg/h via INTRAVENOUS
  Administered 2022-02-20: 0.7 ug/kg/h via INTRAVENOUS
  Administered 2022-02-21 (×4): 1.2 ug/kg/h via INTRAVENOUS
  Filled 2022-02-20 (×10): qty 100

## 2022-02-20 NOTE — Progress Notes (Signed)
eLink Physician-Brief Progress Note Patient Name: Logan Crawford DOB: 03/05/1975 MRN: [id number]   Date of Service  02/20/2022  HPI/Events of Note  K+ 6.1  eICU Interventions  Hyperkalemia treatment protocol ordered.        Migdalia Dk 02/20/2022, 6:47 AM

## 2022-02-20 NOTE — Progress Notes (Addendum)
T[facility] Progress Note:  POD 1 CAB x 3 / IABP / LAA closure  K+ still high 5s to mid 6s overnight.   Complex given what appeared to be hyperkalemic complete heart block in OR before heart surgery had functionally begun ( not on bypass yet ). Would expect this to be due to medication administration or contaminant given fairly normal preop renal function (Cr 1.3). However, this persisted post-bypass event with extensive dialysis equivalent via CPB pump which does not point toward a one-time event.  Difficult to control hyperkalemia was discussed at length with CCM. We administered dantrolene as well. Concern for variant of malignant hyperthermia. Discussed with family - patient has not had a general anesthetic prior. Family does not have a history of issues with anesthetics. CK of 800s not particularly supportive. Does have equivalent of persistent hypercapnea with excess ventilation requirements (22 x 650cc)  If febrile may need re-dosing of dantrolene.   In terms of hemodynamics, went into night on 0.25 milrinone, 5 epi, 8 levo, vaso 0.04 Weaned levo and vaso off and later had to go up on levo.  Milrinone decreased to 0.125 and Fentanyl 200 changed to max of 75 fentanyl and precedex for sedation  Largest change in hemodynamics was Fentanyl weaning. When he is significantly sedated his BP drops.   Exam: Intubated Sedated Agitated HR atrial paced at 80, on 8 epi, off levo, off vaso, milrinone .125. CI 1.8 Abdomen soft, obese Extr wwp IABP in RLE without sig hematoma at insertion site Tones present in DP/PT in both feet.  Vent Mode: SIMV;PRVC;PSV FiO2 (%):  [50 %] 50 % Set Rate:  [16 bmp-22 bmp] 22 bmp Vt Set:  [620 mL-650 mL] 650 mL PEEP:  [5 cmH20-8 cmH20] 8 cmH20 Pressure Support:  [10 cmH20] 10 cmH20 Plateau Pressure:  [26 cmH20-27 cmH20] 26 cmH20   A/P POD 1 CAB x 3 / IABP / LAA closure  N:  keep sedated today Use Fent max of 75 and precedex  CV:  Keep on a floor of 5 of epi  at least Wean levo/vaso to MAP 70-90 Ensure HR 80 or higher with atrial pacing  Resp:  Currently 22 x 650.  Keep intubated today.   GI: NPO  GU: Worsening renal function - Cr 2.3 from 1.3 preop. Ordered lasix 20mg  IV Q6.  Goal is 75-150 uop / hr  - functionally net neg 2L by tomorrow AM.   Heme: Adequate hgb 8.8 On ASA, plan on adding plavix POD 3 after IABP out.  Starting dvt ppx heparin tonight Would NOT do therapeutic heparin for IABP at all.   ID: on periop cefazolin x 48 hours  Endo: insulin mgmt per CCM  T/L/D: Leave all in today Eventually cut pacing wires.   Overall plan: Thursday: stability Friday: Extubate Saturday: IABP out if ready  Would not extubate and DC balloon same day.   ADDENDUM: Tentative plan to re-dose dantrolene today pending fever, CK curve.       Latest Ref Rng & Units 02/20/2022    6:38 AM 02/20/2022    4:20 AM 02/20/2022    2:10 AM  CBC  WBC 4.0 - 10.5 K/uL  11.3    Hemoglobin 13.0 - 17.0 g/dL 8.8  7.9  7.5   Hematocrit 39.0 - 52.0 % 26.0  22.9  22.0   Platelets 150 - 400 K/uL  148         Latest Ref Rng & Units 02/20/2022    6:38 AM  02/20/2022    4:20 AM 02/20/2022    2:10 AM  CMP  Glucose 70 - 99 mg/dL  174    BUN 6 - 20 mg/dL  18    Creatinine 9.44 - 1.24 mg/dL  9.67    Sodium 591 - 638 mmol/L 137  137  135   Potassium 3.5 - 5.1 mmol/L 5.8  6.1  6.3   Chloride 98 - 111 mmol/L  105    CO2 22 - 32 mmol/L  22    Calcium 8.9 - 10.3 mg/dL  7.4      ABG    Component Value Date/Time   PHART 7.344 (L) 02/20/2022 0638   PCO2ART 36.2 02/20/2022 0638   PO2ART 134 (H) 02/20/2022 0638   HCO3 19.7 (L) 02/20/2022 0638   TCO2 21 (L) 02/20/2022 0638   ACIDBASEDEF 5.0 (H) 02/20/2022 0638   O2SAT 53.9 02/20/2022 0645    Vent Mode: SIMV;PRVC;PSV FiO2 (%):  [50 %] 50 % Set Rate:  [16 bmp-22 bmp] 22 bmp Vt Set:  [620 mL-650 mL] 650 mL PEEP:  [5 cmH20-8 cmH20] 8 cmH20 Pressure Support:  [10 cmH20] 10 cmH20 Plateau  Pressure:  [26 cmH20-27 cmH20] 26 cmH20

## 2022-02-20 NOTE — Progress Notes (Signed)
[name] Progress Note   Subjective:   in ICU intubated, multiple drips, UOP 3.1L yesterday  Objective Vitals:   02/20/22 1145 02/20/22 1200 02/20/22 1215 02/20/22 1230  BP:  (!) 153/87  (!) 126/90  Pulse: 80 80 80 80  Resp: (!) 22 (!) 22 (!) 22 (!) 22  Temp: 99 F (37.2 C) 98.4 F (36.9 C) 98.8 F (37.1 C) 98.6 F (37 C)  TempSrc:      SpO2: 100% 100% 100% 100%  Weight:      Height:       Physical Exam General: intubated sedated Heart: RRR on monitor Lungs: mech BS BL, 100% on FiO2 50% Abdomen: not tense Extremities: warm   Additional Objective Labs: Basic Metabolic Panel: Recent Labs  Lab 02/14/22 0228 02/15/22 0639 02/19/22 2144 02/19/22 2355 02/20/22 0420 02/20/22 0638 02/20/22 0954  NA 138   < > 137   < > 137 137 137  K 4.1   < > 5.1   < > 6.1* 5.8* 5.4*  5.5*  CL 109   < > 110  --  105  --  105  CO2 23   < > 20*  --  22  --  21*  GLUCOSE 100*   < > 143*  --  153*  --  123*  BUN 15   < > 15  --  18  --  20  CREATININE 1.59*   < > 2.18*  --  2.34*  --  2.53*  CALCIUM 8.3*   < > 7.2*  --  7.4*  --  7.9*  PHOS 4.5  --   --   --   --   --   --    < > = values in this interval not displayed.   Liver Function Tests: No results for input(s): "AST", "ALT", "ALKPHOS", "BILITOT", "PROT", "ALBUMIN" in the last 168 hours. No results for input(s): "LIPASE", "AMYLASE" in the last 168 hours. CBC: Recent Labs  Lab 02/18/22 0230 02/19/22 0603 02/19/22 0756 02/19/22 1719 02/19/22 1757 02/19/22 2144 02/19/22 2355 02/20/22 0210 02/20/22 0420 02/20/22 0638  WBC 9.4 9.3  --  17.4*  --  11.3*  --   --  11.3*  --   HGB 13.8 15.3   < > 11.6*  11.3*   < > 8.7*   < > 7.5* 7.9* 8.8*  HCT 41.3 43.9   < > 34.0*  33.4*   < > 24.9*   < > 22.0* 22.9* 26.0*  MCV 92.2 89.6  --  91.3  --  90.2  --   --  90.2  --   PLT 274 263   < > 166  --  154  --   --  148*  --    < > = values in this interval not displayed.   Blood Culture No results found for:  "SDES", "SPECREQUEST", "CULT", "REPTSTATUS"  Cardiac Enzymes: Recent Labs  Lab 02/19/22 1800 02/20/22 0954  CKTOTAL 812* 1,249*   CBG: Recent Labs  Lab 02/20/22 0735 02/20/22 0914 02/20/22 1010 02/20/22 1127 02/20/22 1220  GLUCAP 151* 133* 140* 146* 156*   Iron Studies: No results for input(s): "IRON", "TIBC", "TRANSFERRIN", "FERRITIN" in the last 72 hours. @ Studies/Results: DG Chest Port 1 View  Result Date: 02/20/2022 CLINICAL DATA:  CABG. EXAM: PORTABLE CHEST 1 VIEW COMPARISON:  Chest 02/19/2022 FINDINGS: Endotracheal tube in good position. Swan-Ganz catheter in the main pulmonary artery unchanged. NG tube enters the stomach with  the tip not visualized. Left chest tube in place. No pneumothorax. Left atrial appendage clip unchanged. Metal marker over the aortic arch possible intra-aortic balloon pump unchanged. Progression of bilateral airspace disease left greater than right. Small bilateral effusions. No definite edema. IMPRESSION: 1. Support lines remain in good position. 2. Progression of bilateral airspace disease left greater than right. Probable edema. Electronically Signed   By: Marlan Palauharles  Clark M.D.   On: 02/20/2022 07:57   DG Abd 1 View  Result Date: 02/19/2022 CLINICAL DATA:  Orogastric tube placement EXAM: ABDOMEN - 1 VIEW COMPARISON:  None Available. FINDINGS: Subdiaphragmatic, right flank, and inferior pelvic regions are excluded from view. Nasogastric tube tip overlies the expected gastric fundus. Visualized abdominal gas pattern is nonobstructive. IMPRESSION: 1. Nasogastric tube tip within the gastric fundus. Electronically Signed   By: Helyn NumbersAshesh  Parikh M.D.   On: 02/19/2022 20:36   DG Chest Port 1 View  Result Date: 02/19/2022 CLINICAL DATA:  Status post CABG EXAM: PORTABLE CHEST 1 VIEW COMPARISON:  02/11/2022 FINDINGS: Single frontal view of the chest demonstrates endotracheal tube overlying tracheal air column tip at level of thoracic inlet. Enteric  catheter passes below diaphragm, tip excluded by collimation. The side port of the enteric catheter projects approximately 5 cm above the gastroesophageal junction. There is a right internal jugular flow directed central venous catheter tip overlying the main pulmonary outflow tract. Median sternotomy wires and mediastinal clips are noted. The cardiac silhouette is stable. Lung volumes are diminished, with crowding the central vasculature. Patchy bibasilar atelectasis. No effusion or pneumothorax. IMPRESSION: 1. Support devices as above. 2. Low lung volumes. Electronically Signed   By: Sharlet SalinaMichael  Brown M.D.   On: 02/19/2022 17:47   ECHO INTRAOPERATIVE TEE  Result Date: 02/19/2022  *INTRAOPERATIVE TRANSESOPHAGEAL REPORT *  Patient Name:   Logan Crawford Date of Exam: 02/19/2022 Medical Rec #:  [id number]        Height:       72.0 in Accession #:    [id number]       Weight:       222.4 lb Date of Birth:  1975-10-19       BSA:          2.23 m Patient Age:    46 years         BP:           136/69 mmHg Patient Gender: M                HR:           68 bpm. Exam Location:  Anesthesiology Transesophogeal exam was perform intraoperatively during surgical procedure. Patient was closely monitored under general anesthesia during the entirety of examination. Indications:     CAD Native Vessel i25.10 Sonographer:     Irving BurtonEmily Senior RDCS Performing Phys: Karna Christmasyan Ellender MD Diagnosing Phys: Karna Christmasyan Ellender MD Complications: No known complications during this procedure. POST-OP IMPRESSIONS _ Left Ventricle: The left ventricle appeared globally hypokenetic upon seperation from cardiopulmonary bypass. In time it appears unchanged from pre-bypass. _ Right Ventricle: The right ventricle appears unchanged from pre-bypass. _ Aorta: A balloon pump is in place with the tip in the proximal descending aorta. _ Aortic Valve: The aortic valve appears unchanged from pre-bypass. _ Mitral Valve: The mitral valve appears unchanged from pre-bypass.  _ Tricuspid Valve: The tricuspid valve appears unchanged from pre-bypass. _ Pulmonic Valve: The pulmonic valve appears unchanged from pre-bypass. _ Interatrial Septum: The interatrial septum appears unchanged from pre-bypass. _  Pericardium: The pericardium appears unchanged from pre-bypass. PRE-OP FINDINGS  Left Ventricle: The left ventricle has normal systolic function, with an ejection fraction of 55-60%. The cavity size was normal. There is no increase in left ventricular wall thickness. There is no left ventricular hypertrophy. Right Ventricle: The right ventricle has normal systolic function. The cavity was normal. There is no increase in right ventricular wall thickness. Left Atrium: Left atrial size was normal in size. No left atrial/left atrial appendage thrombus was detected. Right Atrium: Right atrial size was normal in size. Interatrial Septum: No atrial level shunt detected by color flow Doppler. Pericardium: There is no evidence of pericardial effusion. Mitral Valve: The mitral valve is normal in structure. Mitral valve regurgitation is mild by color flow Doppler. Tricuspid Valve: The tricuspid valve was normal in structure. Tricuspid valve regurgitation was not visualized by color flow Doppler. Aortic Valve: The aortic valve is normal in structure. Aortic valve regurgitation was not visualized by color flow Doppler. There is no stenosis of the aortic valve. Pulmonic Valve: The pulmonic valve was normal in structure. Pulmonic valve regurgitation is trivial by color flow Doppler. Aorta: The aortic root, ascending aorta and aortic arch are normal in size and structure.  Karna Christmas MD Electronically signed by Karna Christmas MD Signature Date/Time: 02/19/2022/3:10:32 PM    Final    VAS US DOPPLER PRE CABG  Result Date: 02/18/2022 PREOPERATIVE VASCULAR EVALUATION Patient Name:  Logan Crawford  Date of Exam:   02/18/2022 Medical Rec #: [id number]         Accession #:    [id number] Date of Birth:  02-20-1976        Patient Gender: M Patient Age:   63 years Exam Location:  [facility] Procedure:      VAS US DOPPLER PRE CABG Referring Phys: Reuel Boom ENTER --------------------------------------------------------------------------------  Indications:  Pre-CABG. Risk Factors: Hypertension, hyperlipidemia, past history of smoking, coronary               artery disease. Performing Technologist: Marilynne Halsted RDMS, RVT  Examination Guidelines: A complete evaluation includes B-mode imaging, spectral Doppler, color Doppler, and power Doppler as needed of all accessible portions of each vessel. Bilateral testing is considered an integral part of a complete examination. Limited examinations for reoccurring indications may be performed as noted.  Right Carotid Findings: +----------+--------+--------+--------+--------+------------------+           PSV cm/sEDV cm/sStenosisDescribeComments           +----------+--------+--------+--------+--------+------------------+ CCA Prox  90      21                                         +----------+--------+--------+--------+--------+------------------+ CCA Distal121     31                      intimal thickening +----------+--------+--------+--------+--------+------------------+ ICA Prox  75      24                      intimal thickening +----------+--------+--------+--------+--------+------------------+ ICA Distal51      23                                         +----------+--------+--------+--------+--------+------------------+ ECA       153  19                                         +----------+--------+--------+--------+--------+------------------+ +----------+--------+-------+----------------+------------+           PSV cm/sEDV cmsDescribe        Arm Pressure +----------+--------+-------+----------------+------------+ Subclavian158            Multiphasic, WNL              +----------+--------+-------+----------------+------------+ +---------+--------+--+--------+--+---------+ VertebralPSV cm/s27EDV cm/s13Antegrade +---------+--------+--+--------+--+---------+ Left Carotid Findings: +----------+--------+--------+--------+--------+------------------+           PSV cm/sEDV cm/sStenosisDescribeComments           +----------+--------+--------+--------+--------+------------------+ CCA Prox  95      24                                         +----------+--------+--------+--------+--------+------------------+ CCA Distal115     31                      intimal thickening +----------+--------+--------+--------+--------+------------------+ ICA Prox  79      30                      intimal thickening +----------+--------+--------+--------+--------+------------------+ ICA Distal81      33                                         +----------+--------+--------+--------+--------+------------------+ ECA       144     12                                         +----------+--------+--------+--------+--------+------------------+ +----------+--------+--------+----------------+------------+ SubclavianPSV cm/sEDV cm/sDescribe        Arm Pressure +----------+--------+--------+----------------+------------+           143             Multiphasic, WNL             +----------+--------+--------+----------------+------------+ +---------+--------+--+--------+--+---------+ VertebralPSV cm/s39EDV cm/s17Antegrade +---------+--------+--+--------+--+---------+  ABI Findings: +--------+------------------+-----+---------+--------+ Right   Rt Pressure (mmHg)IndexWaveform Comment  +--------+------------------+-----+---------+--------+ Brachial                       triphasic         +--------+------------------+-----+---------+--------+ ATA                            triphasic         +--------+------------------+-----+---------+--------+  PTA                            triphasic         +--------+------------------+-----+---------+--------+ +--------+------------------+-----+---------+-------+ Left    Lt Pressure (mmHg)IndexWaveform Comment +--------+------------------+-----+---------+-------+ Brachial                       triphasic        +--------+------------------+-----+---------+-------+ ATA                            triphasic        +--------+------------------+-----+---------+-------+  PTA                            triphasic        +--------+------------------+-----+---------+-------+  Right Doppler Findings: +--------+--------+-----+---------+--------+ Site    PressureIndexDoppler  Comments +--------+--------+-----+---------+--------+ Brachial             triphasic         +--------+--------+-----+---------+--------+ Radial               biphasic          +--------+--------+-----+---------+--------+ Ulnar                biphasic          +--------+--------+-----+---------+--------+  Left Doppler Findings: +--------+--------+-----+---------+--------+ Site    PressureIndexDoppler  Comments +--------+--------+-----+---------+--------+ Brachial             triphasic         +--------+--------+-----+---------+--------+   Summary: Right Carotid: Velocities in the right ICA are consistent with a 1-39% stenosis. Left Carotid: Velocities in the left ICA are consistent with a 1-39% stenosis. Vertebrals: Bilateral vertebral arteries demonstrate antegrade flow. Right ABI: Normal waveforms. Left ABI: Normal waveforms. Right Upper Extremity: Doppler waveforms remain within normal limits with right radial compression. Doppler waveform obliterate with right ulnar compression. Left Upper Extremity: Doppler waveforms remain within normal limits with left radial compression. Doppler waveforms remain within normal limits with left ulnar compression.  Electronically signed by Heath Lark on  02/18/2022 at 4:34:15 PM.    Final    Medications:  sodium chloride 20 mL/hr at 02/20/22 1200   sodium chloride     sodium chloride 10 mL/hr at 02/20/22 1200   amiodarone 30 mg/hr (02/20/22 1200)    ceFAZolin (ANCEF) IV Stopped (02/20/22 0705)   dexmedetomidine (PRECEDEX) IV infusion 0.7 mcg/kg/hr (02/20/22 1200)   epinephrine 8 mcg/min (02/20/22 1200)   fentaNYL infusion INTRAVENOUS 75 mcg/hr (02/20/22 1200)   insulin 1.7 Units/hr (02/20/22 1200)   lactated ringers     lactated ringers     lactated ringers 20 mL/hr at 02/20/22 1200   milrinone 0.25 mcg/kg/min (02/20/22 1200)   norepinephrine (LEVOPHED) Adult infusion 5 mcg/min (02/20/22 1200)    acetaminophen  1,000 mg Oral Q6H   Or   acetaminophen (TYLENOL) oral liquid 160 mg/5 mL  1,000 mg Per Tube Q6H   aspirin EC  325 mg Oral Daily   Or   aspirin  324 mg Per Tube Daily   atorvastatin  80 mg Per Tube Daily   bisacodyl  10 mg Oral Daily   Or   bisacodyl  10 mg Rectal Daily   Chlorhexidine Gluconate Cloth  6 each Topical Daily   docusate  100 mg Per Tube BID   fentaNYL (SUBLIMAZE) injection  50 mcg Intravenous Once   furosemide  20 mg Intravenous Q6H   gabapentin  100 mg Per Tube Q12H   insulin aspart  0-24 Units Subcutaneous Q4H   pantoprazole (PROTONIX) IV  40 mg Intravenous Q24H   polyethylene glycol  17 g Per Tube Daily   sodium chloride flush  3 mL Intravenous Q12H   sodium zirconium cyclosilicate  10 g Per Tube BID    Assessment/Plan:  Hyperkalemia -   He certainly has multiple risk factors for hyperkalemia (AKI, mild CKD, K fluids, transfusions, poss hypoperfusion, mildly elevated CK) but the degree and timing are a bit out of proportion to expected.  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 Heparin has been implicated in hyperkalemia but it's through reduced aldo synthesis and not an immediate effect.   No need for RRT at this time -  d/w primary, in agreement  Continue med mgmt with fluids, lasix and lokelma mainly.  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  Currently on vasopressin and epi drip. VDRF - per PCCM AKI on CKD stage IIIa - Baseline Cr 1.3 now with AKI in setting after CABG, brief arrest.   Cr up to 2.5 today but UOP is decent. UA +blood on dip, none on micro.  Continue to follow.   ABLA - transfused to 8s; follow Cocaine abuse - will need counseling on cessation.    Estill Bakes MD 02/20/2022, 12:46 PM  [facility] Pager: [phone]

## 2022-02-20 NOTE — Progress Notes (Signed)
     [address]       [place] [address]             [phone]       EVENING ROUNDS  Has been hemodynamically stable K down to 5.1 Will plan on extubation trials tomorrow Follow K

## 2022-02-20 NOTE — Progress Notes (Addendum)
On epi 5 mcg +mirinone 0.25 mcg.  IABP 1:1  Hypertensive.   CVP 14 PA 32/20 (25) PCWP 20 SVR 1900 CO 4.5  CI  2.0    Swan #s  per Dr Gala Romney.   Cut back Epi to 2 mcg  Increase milrinone 0.375 mcg.  Given another 60 mg IV lasix now.    Goal Maps between 60-90   Logan Rohleder NP-C  5:48 PM

## 2022-02-20 NOTE — Anesthesia Postprocedure Evaluation (Signed)
Anesthesia Post Note  Patient: Logan Crawford  Procedure(s) Performed: CORONARY ARTERY BYPASS GRAFTING (CABG) X THREE, USING ENDOSCOPICALLY HARVESTED RIGHT GREATER SAPHENOUS VEIN  AND LEFT ATRIAL APPENDAGE CLIPPING (Chest)     Patient location during evaluation: SICU Anesthesia Type: General Level of consciousness: sedated Pain management: pain level controlled Vital Signs Assessment: vitals unstable Respiratory status: patient remains intubated per anesthesia plan Cardiovascular status: unstable Postop Assessment: no apparent nausea or vomiting Anesthetic complications: no   No notable events documented.  Last Vitals:  Vitals:   02/20/22 0645 02/20/22 0700  BP:  (!) 146/103  Pulse: 84 80  Resp: (!) 6 (!) 22  Temp: 37.4 C 37.4 C  SpO2: 100% 100%    Last Pain:  Vitals:   02/20/22 0400  TempSrc: Core  PainSc:                  Collene Schlichter

## 2022-02-20 NOTE — Progress Notes (Signed)
NAME:  Logan Crawford, MRN:  [id number], DOB:  [birth year], LOS: 2 ADMISSION DATE:  02/17/2022, CONSULTATION DATE:  12/27 REFERRING MD:  12/27, CHIEF COMPLAINT:  post-op CABG  History of Present Illness:  46 yom w/ hx as outlined below had known 2V CAD presented to ER 12/21 w/ CP c/w prior NSTEMI. Left Heart cath showed re-stenosis of LAD (felt 2/2 non-compliance w DAPT and cocaine abuse). Also found to have sig diseased RCA. Presents to the CVICU 12/27 s/p CABG X 3V w/ placement of IABP, had brief CHB intraoperative w/ potassium noted around 6.8 and pt placed on bypass urgently  PCCM asked to assist w/ post op care   Pertinent  Medical History  CAD . 11/2019 NSTEMI/PCI: LM min irregs, LAD 35m (2.75x22 Resolute Onyx DES)  HFpEF 60-65% HTN, HL, tobacco abuse  Polysubstance abuse.  Significant Hospital Events: Including procedures, antibiotic start and stop dates in addition to other pertinent events   12/21 admitted. Left heart cath: Progression of Severe Multivessel: - 2 potential culprit lesions: distal-mid RCA ulcerated 95% ruptured plaque (mid #2), distal mid LAD stent edge 99% with diffuse disease elsewhere RCA:-proximal #1 70%, followed by proximal #2 70%, mid #1 1 70% and mid #2 95%, ostial PDA 60% LAD: Long stented area with proximal concentric 70% are, jailed D2 85% Zio, distal stent focal 80% followed by segmental 50% then 99% at distal edge. LCx-small OM1 and OM 2 with bifurcating OM 3, AVG circumflex with small branches  12/26 ECHO:  1. Small area of distal septal/inferior basal hypokinesis . Left  ventricular ejection fraction, by estimation, is 55%. The left ventricle  has normal function. The left ventricle has no regional wall motion  abnormalities. There is mild left ventricular hypertrophy. Left ventricular diastolic parameters are consistent with  Grade I diastolic dysfunction (impaired relaxation).  Right ventricular systolic function is normal.  35/57 3 V CABG and  placement of IABP by Enter. Intra-op received 2 units PRBC. Consulted Nephro, 1uPRBC overnight 12/28 - SIMV/PS   Interim History / Subjective:  No events overnight, received 1uPRBC. Spoke with Dr. Delia Chimes and would like to work on stabilization today and work on extubation tomorrow. Prefers patient to be on SMIV.   Objective   Blood pressure 118/85, pulse 63, temperature 98.1 F (36.7 C), temperature source Oral, resp. rate (Abnormal) 7, height 6' (1.829 m), weight 100.9 kg, SpO2 90 %. PAP: (25-65)/(-16-21) 25/12      Intake/Output Summary (Last 24 hours) at 02/19/2022 1335 Last data filed at 02/19/2022 1245 Gross per 24 hour  Intake 1300 ml  Output 450 ml  Net 850 ml   Filed Weights   02/17/22 1500  Weight: 100.9 kg   Physical Examination: General: Acutely ill-appearing middle aged male in NAD. Intubated and Sedated HEENT: [place]/AT, anicteric sclera, PERRL, moist mucous membranes. ETT/OG Tube in place.  Neuro: Sedated. Does not respond to verbal, tactile or noxious stimuli. Not following commands.  CV: Paced, no m/g/r. PULM: Breathing even and unlabored on MV. Lung fields Clear/Diminished. GI: Soft, nontender, nondistended. Hypoactive bowel sounds. Extremities:  LE edema noted. Skin: Warm/dry, Intact.  Resolved Hospital Problem list     Assessment & Plan:  NSTEMI w/ Severe multivessel CAD, now s/p 3 V CABG 12/27 w/ post-operative cardiogenic shock/vasoplegia w/ IABP Plan Continue to wean pressors for MAP >65  Wean inotropes per protocol (keeping epi and vasopressin at cont fixed dose)  CT management per [facility] Tele w/ Pacer Tight glycemic control  IABP 1:1  managed by TCTS  Need for mechanical ventilation  Plan Cont full vent support (no wean today) Cont SIMV per TCTS VAP bundle  RASS goal 0 to -1  Severe hyperkalemia  -etiology not clear. ? Ischmeia v/ anesthesia Plan Amio per TCTS  LA 2.1  Trend BMP > 6.1 K+ > received lokelma, getting diuresis LDH 300 CK  Pending Appreciate Nephro input  CKD stage 2 Plan Ensure MAP > 65 Renal adjust meds Strict I&O  Post-op anemia Plan Monitor H/H Transfuse as needed.   H/o tobacco abuse and poly-substance abuse Plan Cessation support once appropriate   Best Practice (right click and "Reselect all SmartList Selections" daily)   Diet/type: NPO DVT prophylaxis: SCD GI prophylaxis: H2B Lines: Central line Foley:  Yes, and it is still needed Code Status:  full code Last date of multidisciplinary goals of care discussion [pending]  Labs   CBC: Recent Labs  Lab 02/13/22 0515 02/14/22 0228 02/15/22 0646 02/18/22 0230 02/19/22 0603 02/19/22 0756 02/19/22 1048 02/19/22 1117 02/19/22 1144 02/19/22 1148 02/19/22 1200  WBC 7.7 6.9 7.5 9.4 9.3  --   --   --   --   --   --   HGB 14.2 14.1 14.7 13.8 15.3   < > 8.8* 8.8* 9.2* 9.5* 8.8*  HCT 41.9 40.8 43.3 41.3 43.9   < > 26.0* 26.0* 26.7* 28.0* 26.0*  MCV 89.3 87.9 89.5 92.2 89.6  --   --   --   --   --   --   PLT 296 286 271 274 263  --   --   --  172  --   --    < > = values in this interval not displayed.    Basic Metabolic Panel: Recent Labs  Lab 02/14/22 0228 02/15/22 5188 02/16/22 0559 02/17/22 0644 02/18/22 0230 02/19/22 0603 02/19/22 0756 02/19/22 0901 02/19/22 0945 02/19/22 1016 02/19/22 1034 02/19/22 1048 02/19/22 1117 02/19/22 1148 02/19/22 1200  NA 138 136 138 138 137 136   < > 137 136   < > 135 135 133* 131* 132*  K 4.1 4.9 4.6 4.6 4.5 4.6   < > 5.4* 6.5*   < > 7.5* 7.3* 7.3* 7.2* 7.1*  CL 109 108 110 106 107 105   < > 106 106  --   --  104  --  100 99  CO2 23 18* 22 23 21* 22  --   --   --   --   --   --   --   --   --   GLUCOSE 100* 70 83 95 117* 105*   < > 105* 84  --   --  98  --  98 92  BUN 15 13 12 16 13 13    < > 13 14  --   --  13  --  14 13  CREATININE 1.59* 1.44* 1.29* 1.47* 1.41* 1.34*   < > 1.50* 1.60*  --   --  1.60*  --  1.70* 1.60*  CALCIUM 8.3* 8.9 8.9 9.1 8.8* 9.0  --   --   --   --   --   --    --   --   --   MG 2.1  --   --   --   --   --   --   --   --   --   --   --   --   --   --  PHOS 4.5  --   --   --   --   --   --   --   --   --   --   --   --   --   --    < > = values in this interval not displayed.   GFR: Estimated Creatinine Clearance: 69.4 mL/min (A) (by C-G formula based on SCr of 1.6 mg/dL (H)). Recent Labs  Lab 02/14/22 0228 02/15/22 0646 02/18/22 0230 02/19/22 0603  WBC 6.9 7.5 9.4 9.3    Liver Function Tests: No results for input(s): "AST", "ALT", "ALKPHOS", "BILITOT", "PROT", "ALBUMIN" in the last 168 hours. No results for input(s): "LIPASE", "AMYLASE" in the last 168 hours. No results for input(s): "AMMONIA" in the last 168 hours.  ABG    Component Value Date/Time   PHART 7.346 (L) 02/19/2022 1117   PCO2ART 38.9 02/19/2022 1117   PO2ART 332 (H) 02/19/2022 1117   HCO3 21.2 02/19/2022 1117   TCO2 22 02/19/2022 1200   ACIDBASEDEF 4.0 (H) 02/19/2022 1117   O2SAT 100 02/19/2022 1117     Coagulation Profile: No results for input(s): "INR", "PROTIME" in the last 168 hours.  Cardiac Enzymes: No results for input(s): "CKTOTAL", "CKMB", "CKMBINDEX", "TROPONINI" in the last 168 hours.  HbA1C: Hgb A1c MFr Bld  Date/Time Value Ref Range Status  05/18/2021 08:23 AM 5.8 (H) 4.8 - 5.6 % Final    Comment:    (NOTE) Pre diabetes:          5.7%-6.4%  Diabetes:              >6.4%  Glycemic control for   <7.0% adults with diabetes   11/26/2019 07:02 AM 5.7 (H) 4.8 - 5.6 % Final    Comment:    (NOTE) Pre diabetes:          5.7%-6.4%  Diabetes:              >6.4%  Glycemic control for   <7.0% adults with diabetes     CBG: No results for input(s): "GLUCAP" in the last 168 hours.   Critical care time:      Royston Bake, NP-S Cct deferred to Attending

## 2022-02-20 NOTE — Progress Notes (Signed)
  Patient now on Epi 3 and milrinone 0.375 + IABP. Compression stockings placed.   Labs and hemodynamics improved.   Diuresing well on increased dose of IV Lasix.   Suspect we may be able to wean IABP in the am followed by extubation once better diuresed.   EF preserved on bedside echo.   Family updated at bedside earlier this evening.   Arvilla Meres, MD

## 2022-02-20 NOTE — Consult Note (Addendum)
Advanced Heart Failure Team Consult Note   Primary Physician: Pcp, No PCP-Cardiologist:  Debbe Odea, MD  Reason for Consultation: Cardiogenic shock post cardiotomy  HPI:    Logan Crawford is seen today for evaluation of post-cardiotomy cardiogenic shock at the request of Dr. Delia Chimes with TCTS. 46 y.o. male with history of CAD s/p PCI to LAD in 2021 and repeat intervention mid LAD in 03/23, HTN, polysubstance abuse (tobacco, cocaine), HTN, noncompliance.   Admitted 09/23 with NSTEMI. LHC demonstrated 2V CAD treated medically d/t noncompliance. Had not been compliant with meds and UDS + for cocaine.   Admitted to [facility] 12/19 with NSTEMI in setting of noncompliance and ongoing cocaine use. LHC with severe in-stent restenosis in LAD and progression in RCA disease. CABG recommended. He was transferred to [facility] and underwent CABG X 3 (LIMA to Diag, SVG to LAD, SVG to RCA), left atrial appendage closure and insertion of IABP by Dr. Delia Chimes on 02/19/22. Had intra-operative severe hyperkalemia and CHB prior to initiation of cardiopulmonary bypass. Nephrology consulted. Had been treated with Plasmalyte A and tranexamic acid which can cause hyperkalemia. He was treated with IV lasix, IV bicarb and lokelma.  Continued to have hyperkalemia overnight and was given dantrolene. Concern for malignant hyperthermia. CK 800s.  CO-OX 54% this am on Epi 8, Milrinone 0.125 and NE 5. Vaso off. IABP at 1.1. Milrinone increased to 0.25 around 8 am. NE now off, on Epi 5  Current SWAN #s CVP 14 CO 4.1 CI 1.8 PA 32/18 SVR 1300 PAPi 1  Sedated on vent. Becomes agitated with lightening sedation   Review of Systems: Unable to assess, sedated  Home Medications Prior to Admission medications   Medication Sig Start Date End Date Taking? Authorizing Provider  albuterol (VENTOLIN HFA) 108 (90 Base) MCG/ACT inhaler Inhale 2 puffs into the lungs every 6 (six) hours as needed for wheezing or shortness of breath.  11/19/21 02/17/22  Sunnie Nielsen, DO  aspirin EC 81 MG tablet Take 1 tablet (81 mg total) by mouth daily. Swallow whole. 11/19/21   Sunnie Nielsen, DO  carvedilol (COREG) 25 MG tablet Take 1 tablet (25 mg total) by mouth 2 (two) times daily with a meal. 02/17/22   Cipriano Bunker, MD  isosorbide mononitrate (IMDUR) 60 MG 24 hr tablet Take 1 tablet (60 mg total) by mouth 2 (two) times daily. 02/17/22   Cipriano Bunker, MD  nitroGLYCERIN (NITROSTAT) 0.4 MG SL tablet Place 1 tablet (0.4 mg total) under the tongue every 5 (five) minutes as needed for chest pain (if chest pain not resolved after 3 doses, please call 911). 11/19/21   Sunnie Nielsen, DO    Past Medical History: Past Medical History:  Diagnosis Date   CAD (coronary artery disease)    a. 11/2019 NSTEMI/PCI: LM min irregs, LAD 69m (2.75x22 Resolute Onyx DES), 40d, D2 min irregs, LCX/LPAV min irregs, RCA 60ost, 40p/m, EF 45-50%; b. 11/2020 MV: EF 43%, small area of apical ischemia ->? over processing-->low risk.   Hyperlipidemia    Hypertension    Ischemic cardiomyopathy    a. 11/2019 LV Gram: EF 45-50%; b. 12/2019 Echo: EF 50% w/ mod mid-apical anteroseptal HK, GrI DD, nl RV fxn, mild Ao sclerosis.   Tobacco use 11/25/2019    Past Surgical History: Past Surgical History:  Procedure Laterality Date   CORONARY STENT INTERVENTION N/A 01/24/2020   Procedure: CORONARY STENT INTERVENTION;  Surgeon: Iran Ouch, MD;  Location: [facility] INVASIVE CV LAB;  Service: Cardiovascular;  Laterality: N/A;   CORONARY STENT INTERVENTION N/A 05/20/2021   Procedure: CORONARY STENT INTERVENTION;  Surgeon: Iran Ouch, MD;  Location: [facility] INVASIVE CV LAB;  Service: Cardiovascular;  Laterality: N/A;   INTRAVASCULAR ULTRASOUND/IVUS N/A 05/20/2021   Procedure: Intravascular Ultrasound/IVUS;  Surgeon: Iran Ouch, MD;  Location: [facility] INVASIVE CV LAB;  Service: Cardiovascular;  Laterality: N/A;   IRRIGATION AND DEBRIDEMENT KNEE Left     LEFT HEART CATH AND CORONARY ANGIOGRAPHY N/A 01/24/2020   Procedure: LEFT HEART CATH AND CORONARY ANGIOGRAPHY;  Surgeon: Iran Ouch, MD;  Location: [facility] INVASIVE CV LAB;  Service: Cardiovascular;  Laterality: N/A;   LEFT HEART CATH AND CORONARY ANGIOGRAPHY N/A 11/18/2021   Procedure: LEFT HEART CATH AND CORONARY ANGIOGRAPHY;  Surgeon: Iran Ouch, MD;  Location: [facility] INVASIVE CV LAB;  Service: Cardiovascular;  Laterality: N/A;   LEFT HEART CATH AND CORONARY ANGIOGRAPHY N/A 02/13/2022   Procedure: LEFT HEART CATH AND CORONARY ANGIOGRAPHY;  Surgeon: Marykay Lex, MD;  Location: [facility] INVASIVE CV LAB;  Service: Cardiovascular;  Laterality: N/A;   LEFT HEART CATH AND CORS/GRAFTS ANGIOGRAPHY N/A 05/20/2021   Procedure: LEFT HEART CATH AND CORS/GRAFTS ANGIOGRAPHY;  Surgeon: Iran Ouch, MD;  Location: [facility] INVASIVE CV LAB;  Service: Cardiovascular;  Laterality: N/A;   TYMPANOSTOMY TUBE PLACEMENT Bilateral     Family History: Family History  Problem Relation Age of Onset   Coronary artery disease Mother    Arrhythmia Mother        Pacemaker placed in 2021   Heart failure Mother    Coronary artery disease Father     Social History: Social History   Socioeconomic History   Marital status: Single    Spouse name: Not on file   Number of children: Not on file   Years of education: Not on file   Highest education level: Not on file  Occupational History   Not on file  Tobacco Use   Smoking status: Former    Packs/day: 1.00    Years: 22.00    Total pack years: 22.00    Types: Cigarettes    Quit date: 01/23/2020    Years since quitting: 2.0   Smokeless tobacco: Never   Tobacco comments:    Smokes 1-2 cigarettes daily    Vaping Use   Vaping Use: Never used  Substance and Sexual Activity   Alcohol use: Yes    Alcohol/week: 1.0 standard drink of alcohol    Types: 1 Shots of liquor per week    Comment: socially   Drug use: No   Sexual activity: Not on file  Other  Topics Concern   Not on file  Social History Narrative   Not on file   Social Determinants of Health   Financial Resource Strain: Not on file  Food Insecurity: No Food Insecurity (02/17/2022)   Hunger Vital Sign    Worried About Running Out of Food in the Last Year: Never true    Ran Out of Food in the Last Year: Never true  Transportation Needs: No Transportation Needs (02/17/2022)   PRAPARE - Administrator, Civil Service (Medical): No    Lack of Transportation (Non-Medical): No  Physical Activity: Not on file  Stress: Not on file  Social Connections: Not on file    Allergies:  No Known Allergies  Objective:    Vital Signs:   Temp:  [93 F (33.9 C)-99.9 F (37.7 C)] 99.9 F (37.7 C) (12/28 0900) Pulse Rate:  [76-84]  80 (12/28 0900) Resp:  [6-24] 22 (12/28 0900) BP: (99-162)/(52-103) 113/64 (12/28 0900) SpO2:  [98 %-100 %] 100 % (12/28 0900) Arterial Line BP: (89-173)/(45-76) 110/50 (12/28 0900) FiO2 (%):  [50 %] 50 % (12/28 0800) Weight:  [115.3 kg] 115.3 kg (12/28 0500) Last BM Date : 02/18/22  Weight change: Filed Weights   02/17/22 1500 02/20/22 0500  Weight: 100.9 kg 115.3 kg    Intake/Output:   Intake/Output Summary (Last 24 hours) at 02/20/2022 1001 Last data filed at 02/20/2022 0800 Gross per 24 hour  Intake 7713.96 ml  Output 5028 ml  Net 2685.96 ml      Physical Exam    General:  Critically ill appearing. HEENT: + ETT Neck: supple. JVP 14cm.  R IJ SWAN Cor: PMI nondisplaced. Regular rate & rhythm. No rubs, gallops or murmurs. + sternal incision Lungs: mechanical breath sounds Abdomen: soft, nontender, nondistended.  Extremities: no cyanosis, rash, edema, R femoral IABP Neuro: sedated on vent   Telemetry   Atrial paced 80s (personally reviewed)  Labs   Basic Metabolic Panel: Recent Labs  Lab 02/14/22 0228 02/15/22 0639 02/18/22 0230 02/19/22 0603 02/19/22 0756 02/19/22 1401 02/19/22 1437 02/19/22 1443  02/19/22 1719 02/19/22 1800 02/19/22 1922 02/19/22 2144 02/19/22 2355 02/20/22 0210 02/20/22 0420 02/20/22 0638  NA 138   < > 137 136   < > 138   < > 140   < > 139   < > 137 135 135 137 137  K 4.1   < > 4.5 4.6   < > 5.5*   < > 5.0   < > 5.7*   < > 5.1 5.1 6.3* 6.1* 5.8*  CL 109   < > 107 105   < > 107  --  106  --  111  --  110  --   --  105  --   CO2 23   < > 21* 22  --   --   --   --   --  21*  --  20*  --   --  22  --   GLUCOSE 100*   < > 117* 105*   < > 161*  --  134*  --  187*  --  143*  --   --  153*  --   BUN 15   < > 13 13   < > 13  --  14  --  13  --  15  --   --  18  --   CREATININE 1.59*   < > 1.41* 1.34*   < > 1.70*  --  1.60*  --  1.84*  --  2.18*  --   --  2.34*  --   CALCIUM 8.3*   < > 8.8* 9.0  --   --   --   --   --  6.0*  --  7.2*  --   --  7.4*  --   MG 2.1  --   --   --   --   --   --   --   --   --   --  2.7*  --   --  2.5*  --   PHOS 4.5  --   --   --   --   --   --   --   --   --   --   --   --   --   --   --    < > =  values in this interval not displayed.    Liver Function Tests: No results for input(s): "AST", "ALT", "ALKPHOS", "BILITOT", "PROT", "ALBUMIN" in the last 168 hours. No results for input(s): "LIPASE", "AMYLASE" in the last 168 hours. No results for input(s): "AMMONIA" in the last 168 hours.  CBC: Recent Labs  Lab 02/18/22 0230 02/19/22 0603 02/19/22 0756 02/19/22 1144 02/19/22 1148 02/19/22 1719 02/19/22 1757 02/19/22 2144 02/19/22 2355 02/20/22 0210 02/20/22 0420 02/20/22 0638  WBC 9.4 9.3  --   --   --  17.4*  --  11.3*  --   --  11.3*  --   HGB 13.8 15.3   < > 9.2*   < > 11.6*  11.3*   < > 8.7* 8.5* 7.5* 7.9* 8.8*  HCT 41.3 43.9   < > 26.7*   < > 34.0*  33.4*   < > 24.9* 25.0* 22.0* 22.9* 26.0*  MCV 92.2 89.6  --   --   --  91.3  --  90.2  --   --  90.2  --   PLT 274 263  --  172  --  166  --  154  --   --  148*  --    < > = values in this interval not displayed.    Cardiac Enzymes: Recent Labs  Lab 02/19/22 1800   CKTOTAL 812*    BNP: BNP (last 3 results) Recent Labs    06/27/21 1707 11/17/21 0043  BNP 18.9 23.6    ProBNP (last 3 results) No results for input(s): "PROBNP" in the last 8760 hours.   CBG: Recent Labs  Lab 02/20/22 0411 02/20/22 0510 02/20/22 0631 02/20/22 0735 02/20/22 0914  GLUCAP 156* 163* 138* 151* 133*    Coagulation Studies: Recent Labs    02/19/22 1719  LABPROT 15.9*  INR 1.3*     Imaging   DG Chest Port 1 View  Result Date: 02/20/2022 CLINICAL DATA:  CABG. EXAM: PORTABLE CHEST 1 VIEW COMPARISON:  Chest 02/19/2022 FINDINGS: Endotracheal tube in good position. Swan-Ganz catheter in the main pulmonary artery unchanged. NG tube enters the stomach with the tip not visualized. Left chest tube in place. No pneumothorax. Left atrial appendage clip unchanged. Metal marker over the aortic arch possible intra-aortic balloon pump unchanged. Progression of bilateral airspace disease left greater than right. Small bilateral effusions. No definite edema. IMPRESSION: 1. Support lines remain in good position. 2. Progression of bilateral airspace disease left greater than right. Probable edema. Electronically Signed   By: Marlan Palau M.D.   On: 02/20/2022 07:57   DG Abd 1 View  Result Date: 02/19/2022 CLINICAL DATA:  Orogastric tube placement EXAM: ABDOMEN - 1 VIEW COMPARISON:  None Available. FINDINGS: Subdiaphragmatic, right flank, and inferior pelvic regions are excluded from view. Nasogastric tube tip overlies the expected gastric fundus. Visualized abdominal gas pattern is nonobstructive. IMPRESSION: 1. Nasogastric tube tip within the gastric fundus. Electronically Signed   By: Helyn Numbers M.D.   On: 02/19/2022 20:36   DG Chest Port 1 View  Result Date: 02/19/2022 CLINICAL DATA:  Status post CABG EXAM: PORTABLE CHEST 1 VIEW COMPARISON:  02/11/2022 FINDINGS: Single frontal view of the chest demonstrates endotracheal tube overlying tracheal air column tip at  level of thoracic inlet. Enteric catheter passes below diaphragm, tip excluded by collimation. The side port of the enteric catheter projects approximately 5 cm above the gastroesophageal junction. There is a right internal jugular flow directed central venous catheter tip overlying the main  pulmonary outflow tract. Median sternotomy wires and mediastinal clips are noted. The cardiac silhouette is stable. Lung volumes are diminished, with crowding the central vasculature. Patchy bibasilar atelectasis. No effusion or pneumothorax. IMPRESSION: 1. Support devices as above. 2. Low lung volumes. Electronically Signed   By: Sharlet Salina M.D.   On: 02/19/2022 17:47     Medications:     Current Medications:  acetaminophen  1,000 mg Oral Q6H   Or   acetaminophen (TYLENOL) oral liquid 160 mg/5 mL  1,000 mg Per Tube Q6H   aspirin EC  325 mg Oral Daily   Or   aspirin  324 mg Per Tube Daily   atorvastatin  80 mg Per Tube Daily   bisacodyl  10 mg Oral Daily   Or   bisacodyl  10 mg Rectal Daily   Chlorhexidine Gluconate Cloth  6 each Topical Daily   docusate  100 mg Per Tube BID   fentaNYL (SUBLIMAZE) injection  50 mcg Intravenous Once   furosemide  20 mg Intravenous Q6H   [START ON 02/21/2022] pantoprazole  40 mg Oral Daily   polyethylene glycol  17 g Per Tube Daily   sodium chloride flush  3 mL Intravenous Q12H   sodium zirconium cyclosilicate  10 g Oral BID    Infusions:  sodium chloride 20 mL/hr at 02/20/22 0800   sodium chloride     sodium chloride 10 mL/hr at 02/20/22 0800   amiodarone 30 mg/hr (02/20/22 0927)    ceFAZolin (ANCEF) IV Stopped (02/20/22 0705)   dexmedetomidine (PRECEDEX) IV infusion 0.7 mcg/kg/hr (02/20/22 0800)   epinephrine 8 mcg/min (02/20/22 0800)   fentaNYL infusion INTRAVENOUS 75 mcg/hr (02/20/22 0800)   insulin 1.8 Units/hr (02/20/22 0800)   lactated ringers     lactated ringers     lactated ringers 20 mL/hr at 02/20/22 0800   milrinone 0.125 mcg/kg/min  (02/20/22 0800)   nitroGLYCERIN     norepinephrine (LEVOPHED) Adult infusion 5 mcg/min (02/20/22 0800)   phenylephrine (NEO-SYNEPHRINE) Adult infusion 0 mcg/min (02/19/22 1848)   vasopressin Stopped (02/20/22 0603)     Assessment/Plan   Cardiogenic shock, post cardiotomy -S/p CABG 12/27. CHB and severe hyperkalemia prior to placement on cardiopulmonary bypass. Placed on CPB urgently.  -PreCABG echo EF 55% -LV function appeared reduced intraoperatively -SWAN #s 11:30 am - CI 1.8, CVP 14, SVR 1300, PAPi 1  -Currently on milrinone 0.25 + 5 Epi + IABP at 1:1 -Lactic acid 2.1>2.6 -? Increasing Epi to support his RV. Would have to watch MAP, increases with agitation. PharmD discussed sedation with CCM. -Brisk diuresis with low-dose IV lasix. TCTS dosing, currenlty on 20 mg lasix IV q hrs.  -Check echo  CAD -Hx prior PCI/stent to LAD in 2021 and PCI/stent to LAD in 03/23 d/t in-stent restenosis -NSTEMI this admit. S/p CABG X 3 (LIMA to Diag, SVG to LAD, SVG to RCA) -Aspirin + statin  Severe Hyperkalemia -Etiology uncertain -Occurred prior to initation CPB. Received dantrolene and IV sodium bicarb. -K down to 5s today, receiving loklema and diuresing -Some concern for malignant hyperthermia? Low-grade temps. No prior reactions to anesthesia.   CHB -Occurred prior to placement on CPB, in setting of hyperkalemia -Currently atrial paced  Polysubstance abuse -Tobacco and cocaine -UDS + for cocaine on admit  AKI on CKD -Baseline Scr 1.1-1.3, 1.6 pre-op -Cr 2.34 today -Support CO   SDOH: -Hx noncompliance and substance abuse -Uninsured. Will need to engage TOC CSW/CM.    Length of Stay: 3  [facility],  LINDSAY N, PA-C  02/20/2022, 10:01 AM  Advanced Heart Failure Team Pager [phone] (M-F; 7a - 5p)  Please contact [facility] for night-coverage after hours (4p -7a ) and weekends on [email or web address]   Agree with above.   46 y/o male as above admitted with NSTEMI is setting of  cocaine use and ISR.  Underwent CABG yesterday. Post-op course c/b inability to wean from bypass and persistent hyperkalemia. Concern for atypical malignant hyperthermia and received dantrolene.   Remains intubated will arouse on vent. On EPI 5, milrinone 0.25.   Ernestine Conrad numbers done personally.   Echo at bedside EF 55% RV ok.   On my arrival BP 160/110  Lactate 2.6 K 6.1 -> 5.4 Scr 1.6 -> 2.5 Co-ox 56%  CKs 812 -> 1249  LDH 390 -> 312  General:  On vent. Sedated will arouse   HEENT: normal +ETT Neck: supple. + swan  Cor: Sternal dressing. Regular rate & rhythm. No rubs, gallops or murmurs. Lungs: clear Abdomen: soft, nontender, nondistended. No hepatosplenomegaly. No bruits or masses. Hypoactive bowel sounds. Extremities: no cyanosis, clubbing, rash, 2+ edema  IABP in place Neuro: sedated will arouse  Suspect MH is unlikely. However etiology of hyperkalemia unclear.  Will adjust drips based on swam numbers (increase milrinone to 0.375, decrease epi to 3)   Place TED hose. Diurese. Watch renal function and potassium.   D/w with CCM at bedside.   CRITICAL CARE Performed by: Arvilla Meres  Total critical care time: 60 minutes  Critical care time was exclusive of separately billable procedures and treating other patients.  Critical care was necessary to treat or prevent imminent or life-threatening deterioration.  Critical care was time spent personally by me (independent of midlevel providers or residents) on the following activities: development of treatment plan with patient and/or surrogate as well as nursing, discussions with consultants, evaluation of patient's response to treatment, examination of patient, obtaining history from patient or surrogate, ordering and performing treatments and interventions, ordering and review of laboratory studies, ordering and review of radiographic studies, pulse oximetry and re-evaluation of patient's condition.  Arvilla Meres, MD   6:06 PM

## 2022-02-21 ENCOUNTER — Inpatient Hospital Stay (HOSPITAL_COMMUNITY): Payer: Medicaid Other

## 2022-02-21 DIAGNOSIS — I214 Non-ST elevation (NSTEMI) myocardial infarction: Secondary | ICD-10-CM

## 2022-02-21 LAB — BASIC METABOLIC PANEL
Anion gap: 12 (ref 5–15)
BUN: 23 mg/dL — ABNORMAL HIGH (ref 6–20)
CO2: 22 mmol/L (ref 22–32)
Calcium: 8.3 mg/dL — ABNORMAL LOW (ref 8.9–10.3)
Chloride: 105 mmol/L (ref 98–111)
Creatinine, Ser: 2.27 mg/dL — ABNORMAL HIGH (ref 0.61–1.24)
GFR, Estimated: 35 mL/min — ABNORMAL LOW (ref >60)
Glucose, Bld: 125 mg/dL — ABNORMAL HIGH (ref 70–99)
Potassium: 4.2 mmol/L (ref 3.5–5.1)
Sodium: 139 mmol/L (ref 135–145)

## 2022-02-21 LAB — CBC
HCT: 23.4 % — ABNORMAL LOW (ref 39.0–52.0)
Hemoglobin: 8.2 g/dL — ABNORMAL LOW (ref 13.0–17.0)
MCH: 30.8 pg (ref 26.0–34.0)
MCHC: 35 g/dL (ref 30.0–36.0)
MCV: 88 fL (ref 80.0–100.0)
Platelets: 113 10*3/uL — ABNORMAL LOW (ref 150–400)
RBC: 2.66 MIL/uL — ABNORMAL LOW (ref 4.22–5.81)
RDW: 14.7 % (ref 11.5–15.5)
WBC: 10.7 10*3/uL — ABNORMAL HIGH (ref 4.0–10.5)
nRBC: 0 % (ref 0.0–0.2)

## 2022-02-21 LAB — ECHOCARDIOGRAM COMPLETE
AR max vel: 3.45 cm2
AV Area VTI: 3.49 cm2
AV Area mean vel: 3.21 cm2
AV Mean grad: 3 mmHg
AV Peak grad: 5.8 mmHg
Ao pk vel: 1.2 m/s
Area-P 1/2: 2.99 cm2
Calc EF: 43.1 %
Height: 72 in
S' Lateral: 3.4 cm
Single Plane A2C EF: 48.9 %
Single Plane A4C EF: 36.4 %
Weight: 4035.3 oz

## 2022-02-21 LAB — HAPTOGLOBIN: Haptoglobin: 21 mg/dL — ABNORMAL LOW (ref 23–355)

## 2022-02-21 LAB — GLUCOSE, CAPILLARY
Glucose-Capillary: 133 mg/dL — ABNORMAL HIGH (ref 70–99)
Glucose-Capillary: 132 mg/dL — ABNORMAL HIGH (ref 70–99)
Glucose-Capillary: 112 mg/dL — ABNORMAL HIGH (ref 70–99)
Glucose-Capillary: 117 mg/dL — ABNORMAL HIGH (ref 70–99)
Glucose-Capillary: 133 mg/dL — ABNORMAL HIGH (ref 70–99)

## 2022-02-21 LAB — COOXEMETRY PANEL
Carboxyhemoglobin: 1 % (ref 0.5–1.5)
Methemoglobin: <0.7 % (ref 0.0–1.5)
O2 Saturation: 62.6 %
Total hemoglobin: 8.8 g/dL — ABNORMAL LOW (ref 12.0–16.0)

## 2022-02-21 LAB — POTASSIUM
Potassium: 4.3 mmol/L (ref 3.5–5.1)
Potassium: 4.1 mmol/L (ref 3.5–5.1)

## 2022-02-21 LAB — POCT ACTIVATED CLOTTING TIME: Activated Clotting Time: 158 seconds

## 2022-02-21 MED ORDER — LABETALOL HCL 5 MG/ML IV SOLN
INTRAVENOUS | Status: AC
  Filled 2022-02-21: qty 4

## 2022-02-21 MED ORDER — GABAPENTIN 100 MG PO CAPS: 100.0000 mg | ORAL_CAPSULE | Freq: Two times a day (BID) | ORAL | Status: DC

## 2022-02-21 MED ORDER — LABETALOL HCL 5 MG/ML IV SOLN
5.0000 mg | INTRAVENOUS | Status: DC | PRN
  Administered 2022-02-21: 5 mg via INTRAVENOUS

## 2022-02-21 MED ORDER — ALBUMIN HUMAN 5 % IV SOLN
INTRAVENOUS | Status: AC
  Filled 2022-02-21: qty 250

## 2022-02-21 MED ORDER — FUROSEMIDE 10 MG/ML IJ SOLN
80.0000 mg | Freq: Two times a day (BID) | INTRAMUSCULAR | Status: AC
  Administered 2022-02-21 – 2022-02-24 (×7): 80 mg via INTRAVENOUS
  Filled 2022-02-21 (×7): qty 8

## 2022-02-21 MED ORDER — PERFLUTREN LIPID MICROSPHERE
1.0000 mL | INTRAVENOUS | Status: DC | PRN
  Administered 2022-02-21: 2 mL via INTRAVENOUS

## 2022-02-21 MED ORDER — ORAL CARE MOUTH RINSE: 15.0000 mL | OROMUCOSAL | Status: DC | PRN

## 2022-02-21 MED ORDER — ORAL CARE MOUTH RINSE
15.0000 mL | OROMUCOSAL | Status: DC
  Administered 2022-02-21 – 2022-02-22 (×10): 15 mL via OROMUCOSAL

## 2022-02-21 NOTE — Progress Notes (Signed)
NAME:  Logan Crawford, MRN:  [id number], DOB:  [birth year], LOS: 2 ADMISSION DATE:  02/17/2022, CONSULTATION DATE:  12/27 REFERRING MD:  12/27, CHIEF COMPLAINT:  post-op CABG  History of Present Illness:  46 yom w/ hx as outlined below had known 2V CAD presented to ER 12/21 w/ CP c/w prior NSTEMI. Left Heart cath showed re-stenosis of LAD (felt 2/2 non-compliance w DAPT and cocaine abuse). Also found to have sig diseased RCA. Presents to the CVICU 12/27 s/p CABG X 3V w/ placement of IABP, had brief CHB intraoperative w/ potassium noted around 6.8 and pt placed on bypass urgently  PCCM asked to assist w/ post op care   Pertinent  Medical History  CAD . 11/2019 NSTEMI/PCI: LM min irregs, LAD 25m (2.75x22 Resolute Onyx DES)  HFpEF 60-65% HTN, HL, tobacco abuse  Polysubstance abuse.  Significant Hospital Events: Including procedures, antibiotic start and stop dates in addition to other pertinent events   12/21 admitted. Left heart cath: Progression of Severe Multivessel: - 2 potential culprit lesions: distal-mid RCA ulcerated 95% ruptured plaque (mid #2), distal mid LAD stent edge 99% with diffuse disease elsewhere RCA:-proximal #1 70%, followed by proximal #2 70%, mid #1 1 70% and mid #2 95%, ostial PDA 60% LAD: Long stented area with proximal concentric 70% are, jailed D2 85% Zio, distal stent focal 80% followed by segmental 50% then 99% at distal edge. LCx-small OM1 and OM 2 with bifurcating OM 3, AVG circumflex with small branches  12/26 ECHO:  1. Small area of distal septal/inferior basal hypokinesis . Left  ventricular ejection fraction, by estimation, is 55%. The left ventricle  has normal function. The left ventricle has no regional wall motion  abnormalities. There is mild left ventricular hypertrophy. Left ventricular diastolic parameters are consistent with  Grade I diastolic dysfunction (impaired relaxation).  Right ventricular systolic function is normal.  85/63 3 V CABG and  placement of IABP by Enter. Intra-op received 2 units PRBC. Consulted Nephro, 1uPRBC overnight 12/28 - SIMV/PS received 1 unit of blood 12/29 working on weaning IABP  Interim History / Subjective:  No acute distress, sedated.  Objective   Blood pressure 118/85, pulse 63, temperature 98.1 F (36.7 C), temperature source Oral, resp. rate (Abnormal) 7, height 6' (1.829 m), weight 100.9 kg, SpO2 90 %. PAP: (25-65)/(-16-21) 25/12      Intake/Output Summary (Last 24 hours) at 02/19/2022 1335 Last data filed at 02/19/2022 1245 Gross per 24 hour  Intake 1300 ml  Output 450 ml  Net 850 ml   Filed Weights   02/17/22 1500  Weight: 100.9 kg   Physical Examination: General sedated 46 year old male patient currently on full ventilator support HEENT normocephalic atraumatic orally intubated Pulmonary: Clear, diminished bases no accessory use Cardiac: Regular rate and rhythm currently on IABP Abdomen soft Extremities warm with brisk cap refill pulses palpable Neuro sedated currently, but was agitated earlier  Resolved Hospital Problem list   Severe hyperkalemia, uncertain etiology  Assessment & Plan:  NSTEMI w/ Severe multivessel CAD, now s/p 3 V CABG 12/27 w/ post-operative cardiogenic shock/vasoplegia w/ IABP Plan Goal to wean IABP today  Continue epinephrine and milrinone infusions  Tight glycemic control  IV Lasix Goal-directed medical therapy once off pressors Aspirin and statin  Need for mechanical ventilation  Plan Continuing full ventilator support VAP bundle PAD protocol  CKD stage 2 Plan Avoid hypotension Renal dose medications  Post-op anemia Plan Monitor hemoglobin hematocrit Transfuse as needed  H/o tobacco abuse and poly-substance  abuse Plan Cessation support once appropriate  Best Practice (right click and "Reselect all SmartList Selections" daily)   Diet/type: NPO DVT prophylaxis: SCD GI prophylaxis: H2B Lines: Central line Foley:  Yes, and it  is still needed Code Status:  full code Last date of multidisciplinary goals of care discussion [pending]   Critical care time:  32 minutes    Erick Colace ACNP-BC [place] Pager # [phone] OR # [phone] if no answer

## 2022-02-21 NOTE — Procedures (Signed)
Extubation Procedure Note  Patient Details:   Name: SHAWNE ESKELSON DOB: 11-29-1975 MRN: [id number]   Airway Documentation:    Vent end date: 02/21/22 Vent end time: 1730   Evaluation  O2 sats: stable throughout Complications: No apparent complications Patient did tolerate procedure well. Bilateral Breath Sounds: Clear   Yes  Patient extubated per order to 4L [place] with no apparent complications. Positive cuff leak was noted prior to extubation. Patient achieved NIF of greater than -40 and VC of 1.3L with great patient effort. Vitals are stable. RT will continue to monitor.   Shellsea Borunda [place] 02/21/2022, 5:59 PM

## 2022-02-21 NOTE — Progress Notes (Signed)
Critical care attending attestation note:  Patient seen and examined and relevant ancillary tests reviewed.  I agree with the assessment and plan of care as outlined by Zenia Resides, NP.   Synopsis of assessment and plan:  46 year old man who is critically ill due to acute hypoxic respiratory failure require mechanical ventilation and cardiogenic shock requiring titration of inotropes following three-vessel bypass for coronary artery disease.  Initial course complicated with unexplained severe hyperkalemia which appears to have resolved.  Have successfully diuresed and weaned mechanical ventilation.  Patient is on minimal vasopressor requirements.  While fluctuations in blood pressure with alertness.  Balloon pump is out and site intact.  -Feel that extubation will likely lead to greater hemodynamic stability. -Will attempt brief SBT and extubation if possible this evening.  CRITICAL CARE Performed by: Lynnell Catalan   Total critical care time: 40 minutes  Critical care time was exclusive of separately billable procedures and treating other patients.  Critical care was necessary to treat or prevent imminent or life-threatening deterioration.  Critical care was time spent personally by me on the following activities: development of treatment plan with patient and/or surrogate as well as nursing, discussions with consultants, evaluation of patient's response to treatment, examination of patient, obtaining history from patient or surrogate, ordering and performing treatments and interventions, ordering and review of laboratory studies, ordering and review of radiographic studies, pulse oximetry, re-evaluation of patient's condition and participation in multidisciplinary rounds.  Lynnell Catalan, MD [facility] ICU Physician [facility] [place]  Pager: [phone] Mobile: [phone] After hours: [phone].  02/21/2022, 4:46 PM

## 2022-02-21 NOTE — TOC Initial Note (Signed)
Transition of Care [facility]) - Initial/Assessment Note    Patient Details  Name: Logan Crawford MRN: [id number] Date of Birth: 1975/12/31  [facility]) CM/SW Contact:    Elliot Cousin, RN Phone Number: [phone] 02/21/2022, 11:13 AM  Clinical Narrative:                 HF TOC CM spoke to pt's mother, states pt lives at home with her brother/Uncle. Pt does not have PCP, or insurance. Will arrange follow up appt and HF fund for medications. Pt has utilized [employer]. Will need PT recommendations. Pt may need IP rehab. Will continue to follow for dc needs.   Expected Discharge Plan: IP Rehab Facility Barriers to Discharge: Continued Medical Work up   Patient Goals and CMS Choice Patient states their goals for this hospitalization and ongoing recovery are:: wants him to recover CMS Medicare.gov Compare Post Acute Care list provided to:: Patient Represenative (must comment) (Mother)        Expected Discharge Plan and Services   Discharge Planning Services: CM Consult   Living arrangements for the past 2 months: Mobile Home                                      Prior Living Arrangements/Services Living arrangements for the past 2 months: Mobile Home Lives with:: Relatives          Need for Family Participation in Patient Care: Yes (Comment) Care giver support system in place?: Yes (comment)   Criminal Activity/Legal Involvement Pertinent to Current Situation/Hospitalization: No - Comment as needed  Activities of Daily Living Home Assistive Devices/Equipment: None ADL Screening (condition at time of admission) Patient's cognitive ability adequate to safely complete daily activities?: Yes Is the patient deaf or have difficulty hearing?: No Does the patient have difficulty seeing, even when wearing glasses/contacts?: No Does the patient have difficulty concentrating, remembering, or making decisions?: No Patient able to express need for assistance  with ADLs?: Yes Does the patient have difficulty dressing or bathing?: No Independently performs ADLs?: Yes (appropriate for developmental age) Does the patient have difficulty walking or climbing stairs?: No Weakness of Legs: None Weakness of Arms/Hands: None  Permission Sought/Granted Permission sought to share information with : Case Manager, Family Supports Permission granted to share information with : Yes, Verbal Permission Granted  Share Information with NAME: Daisy Attwood     Permission granted to share info w Relationship: mother  Permission granted to share info w Contact Information: [phone]  Emotional Assessment Appearance:: Appears stated age Attitude/Demeanor/Rapport: Intubated (Following Commands or Not Following Commands)       Psych Involvement: No (comment)  Admission diagnosis:  NSTEMI (non-ST elevated myocardial infarction) (HCC) [I21.4] S/P CABG x 3 [Z95.1] Patient Active Problem List   Diagnosis Date Noted   S/P CABG x 3 02/19/2022   Cocaine abuse (HCC) 02/13/2022   Chronic diastolic CHF (congestive heart failure) (HCC) 02/11/2022   Polysubstance abuse (HCC) 11/17/2021   Unstable angina (HCC) 11/16/2021   Noncompliance 11/16/2021   Chest pain 05/18/2021   Hypertensive urgency 05/18/2021   CAD (coronary artery disease) 05/18/2021   Loud snoring 03/28/2020   Gastroesophageal reflux disease without esophagitis    AKI (acute kidney injury) (HCC)    NSTEMI (non-ST elevated myocardial infarction) (HCC) 01/24/2020   Tobacco use 11/25/2019   COVID-19 virus infection 11/25/2019   Abnormal EKG 11/25/2019  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/25/2019   Penile lesion 11/25/2019   Overweight with body mass index (BMI) of 27 to 27.9 in adult    Hyperlipidemia 07/10/2016   Hypertension 10/20/2014   PCP:  Oneita Hurt No Pharmacy:   [facility] [address], [place] - [address] [place] [address] Phone: [phone] Fax:  [phone]  [facility] [address] [place] [address] Phone: [phone] Fax: [phone]     Social Determinants of Health (SDOH) Social History: SDOH Screenings   Food Insecurity: No Food Insecurity (02/17/2022)  Housing: Low Risk  (02/17/2022)  Transportation Needs: No Transportation Needs (02/17/2022)  Utilities: Not At Risk (02/17/2022)  Tobacco Use: Medium Risk (02/20/2022)   SDOH Interventions:     Readmission Risk Interventions     No data to display

## 2022-02-21 NOTE — Progress Notes (Signed)
  Echocardiogram 2D Echocardiogram has been performed.  Logan Crawford 02/21/2022, 9:22 AM

## 2022-02-21 NOTE — Progress Notes (Addendum)
Advanced Heart Failure Rounding Note  PCP-Cardiologist: Debbe Odea, MD   Subjective:    Currently on Epi 4 and Milrinone 0.375 + IABP at 1:2  SWAN #s CVP 10 PAP 27/17 CO 5.3 CI 2.4 CO-OX 63%  MAPs improved to 70s-80s  Scr trending down, 2.27 this am  K improved to 4.2.  Sedated on vent. Becomes agitated with lightening sedation.    Objective:   Weight Range: 114.4 kg Body mass index is 34.21 kg/m.   Vital Signs:   Temp:  [96.1 F (35.6 C)-99.9 F (37.7 C)] 98.6 F (37 C) (12/29 0830) Pulse Rate:  [80-254] 97 (12/29 0830) Resp:  [10-31] 20 (12/29 0830) BP: (77-176)/(47-117) 113/92 (12/29 0830) SpO2:  [92 %-100 %] 98 % (12/29 0830) Arterial Line BP: (43-208)/(30-88) 112/61 (12/29 0830) FiO2 (%):  [50 %-60 %] 60 % (12/29 0540) Weight:  [114.4 kg] 114.4 kg (12/29 0421) Last BM Date : 02/18/22  Weight change: Filed Weights   02/17/22 1500 02/20/22 0500 02/21/22 0421  Weight: 100.9 kg 115.3 kg 114.4 kg    Intake/Output:   Intake/Output Summary (Last 24 hours) at 02/21/2022 0842 Last data filed at 02/21/2022 0800 Gross per 24 hour  Intake 3682.04 ml  Output 4435 ml  Net -752.96 ml      Physical Exam    General:  Sedated on vent HEENT: + ETT Neck: Supple. JVP 10-12. Carotids 2+ bilat; no bruits.  Cor: PMI nondisplaced. Regular rate & rhythm. No rubs, gallops or murmurs. Lungs: Mechanical breath sounds Abdomen: Soft, nontender, nondistended.  Extremities: No cyanosis, clubbing, rash, 1+ edema, R femoral IABP Neuro: Sedated. Agitated with lightening sedation   Telemetry   Atrial paced 80   Labs    CBC Recent Labs    02/20/22 1621 02/21/22 0325  WBC 12.9* 10.7*  HGB 8.7* 8.2*  HCT 26.0* 23.4*  MCV 90.6 88.0  PLT 134* 113*   Basic Metabolic Panel Recent Labs    51/10/21 0420 02/20/22 0638 02/20/22 1621 02/20/22 2059 02/21/22 0325  NA 137   < > 137 135 139  K 6.1*   < > 4.8 4.3 4.2  CL 105   < > 104 106 105  CO2 22    < > 20* 21* 22  GLUCOSE 153*   < > 148* 151* 125*  BUN 18   < > 23* 23* 23*  CREATININE 2.34*   < > 2.61* 2.53* 2.27*  CALCIUM 7.4*   < > 8.2* 8.0* 8.3*  MG 2.5*  --  2.5*  --   --    < > = values in this interval not displayed.   Liver Function Tests No results for input(s): "AST", "ALT", "ALKPHOS", "BILITOT", "PROT", "ALBUMIN" in the last 72 hours. No results for input(s): "LIPASE", "AMYLASE" in the last 72 hours. Cardiac Enzymes Recent Labs    02/19/22 1800 02/20/22 0954  CKTOTAL 812* 1,249*    BNP: BNP (last 3 results) Recent Labs    06/27/21 1707 11/17/21 0043  BNP 18.9 23.6    ProBNP (last 3 results) No results for input(s): "PROBNP" in the last 8760 hours.   D-Dimer No results for input(s): "DDIMER" in the last 72 hours. Hemoglobin A1C No results for input(s): "HGBA1C" in the last 72 hours. Fasting Lipid Panel No results for input(s): "CHOL", "HDL", "LDLCALC", "TRIG", "CHOLHDL", "LDLDIRECT" in the last 72 hours. Thyroid Function Tests No results for input(s): "TSH", "T4TOTAL", "T3FREE", "THYROIDAB" in the last 72 hours.  Invalid input(s): "FREET3"  Other results:   Imaging    DG Chest Port 1 View  Result Date: 02/21/2022 CLINICAL DATA:  Status post CABG and atrial clipping. EXAM: PORTABLE CHEST 1 VIEW COMPARISON:  One-view chest x-ray 02/20/2022 FINDINGS: Endotracheal tube is stable at the level clavicles. NG tube terminates in the stomach. Swan-Ganz catheter enters via right IJ approach in terminates in the right main pulmonary outflow tract. Left-sided chest tube and mediastinal drains are stable. A left pleural effusion has increased. Patchy airspace opacity in the left lung is similar the prior exam. No pneumothorax is present. Lung volumes are low. IMPRESSION: 1. Increasing left pleural effusion. 2. Stable patchy airspace opacity in the left lung, likely reflecting atelectasis. 3. Support apparatus is stable. 4. No pneumothorax. Electronically Signed    By: Marin Roberts M.D.   On: 02/21/2022 08:14     Medications:     Scheduled Medications:  acetaminophen  1,000 mg Oral Q6H   Or   acetaminophen (TYLENOL) oral liquid 160 mg/5 mL  1,000 mg Per Tube Q6H   aspirin EC  325 mg Oral Daily   Or   aspirin  324 mg Per Tube Daily   bisacodyl  10 mg Oral Daily   Or   bisacodyl  10 mg Rectal Daily   Chlorhexidine Gluconate Cloth  6 each Topical Daily   docusate  100 mg Per Tube BID   fentaNYL (SUBLIMAZE) injection  50 mcg Intravenous Once   furosemide  60 mg Intravenous BID   gabapentin  100 mg Per Tube Q12H   insulin aspart  0-24 Units Subcutaneous Q4H   pantoprazole (PROTONIX) IV  40 mg Intravenous Q24H   polyethylene glycol  17 g Per Tube Daily   sodium chloride flush  3 mL Intravenous Q12H   sodium zirconium cyclosilicate  10 g Per Tube BID    Infusions:  sodium chloride 20 mL/hr at 02/21/22 0800   sodium chloride     sodium chloride 10 mL/hr at 02/21/22 0700   amiodarone 30 mg/hr (02/21/22 0800)    ceFAZolin (ANCEF) IV Stopped (02/21/22 2683)   dexmedetomidine (PRECEDEX) IV infusion 1.2 mcg/kg/hr (02/21/22 0800)   epinephrine 4 mcg/min (02/21/22 0800)   fentaNYL infusion INTRAVENOUS 175 mcg/hr (02/21/22 0800)   insulin Stopped (02/20/22 1221)   milrinone 0.375 mcg/kg/min (02/21/22 0800)   norepinephrine (LEVOPHED) Adult infusion 5 mcg/min (02/21/22 0700)    PRN Medications: sodium chloride, dextrose, fentaNYL, midazolam, morphine injection, ondansetron (ZOFRAN) IV, oxyCODONE, sodium chloride flush, traMADol   Assessment/Plan    1. Cardiogenic shock, post cardiotomy -S/p CABG 12/27. CHB and severe hyperkalemia prior to placement on cardiopulmonary bypass. Placed on CPB urgently.  -PreCABG echo EF 55% -LV function appeared reduced intraoperatively -EF preserved on bedside echo -Complete echo this am -CI 2.4 this am on milrinone 0.375 + Epi 4 + IABP 1:2 -TCTS planning to pull IABP this am -CVP 10. Increase IV  lasix to 80 BID. Good diuresis with 60 IV but only net neg 675 yesterday d/t gtts -GDMT once off pressors   2. CAD with NSTEMI -Hx prior PCI/stent to LAD in 2021 and PCI/stent to LAD in 03/23 d/t in-stent restenosis -NSTEMI this admit. S/p CABG X 3 (LIMA to Diag, SVG to LAD, SVG to RCA) -Aspirin + statin  3. AKI on CKD -Baseline Scr 1.1-1.3, 1.6 pre-op -Cr up to 2.5, starting to trend down. 2.27 this am -Support CO -Watch with diuresis.   4. Severe Hyperkalemia -Etiology uncertain -Occurred prior to initation CPB. Received  dantrolene. Some initial concern for malignant hyperthermia, unlikely.  -Resolved. K now 4.2 this am   5. CHB -Occurred prior to placement on CPB, in setting of hyperkalemia -Currently atrial paced   6. Polysubstance abuse -Tobacco and cocaine -UDS + for cocaine on admit   7. SDOH: -Hx noncompliance and substance abuse -Uninsured. Will need to engage TOC CSW/CM.    Length of Stay: 4  FINCH, LINDSAY N, PA-C  02/21/2022, 8:42 AM  Advanced Heart Failure Team Pager [phone] (M-F; 7a - 5p)  Please contact [facility] for night-coverage after hours (5p -7a ) and weekends on [email or web address]  Agree with above.  Remains intubated/sedated. On NE 4 and milrinone 0.375. IABP pulled this am by Dr. Leafy Ro. Swan #s stable. Diuresing well on IV lasix. Renal function and K improving.   General:  Intubated/sedated HEENT: normal + ETT Neck: supple. RIJ swan Carotids 2+ bilat; no bruits. No lymphadenopathy or thryomegaly appreciated. MOL:MBEML dressing Regular rate & rhythm. No rubs, gallops or murmurs. Lungs: clear Abdomen: obese soft, nontender, nondistended. No hepatosplenomegaly. No bruits or masses. Good bowel sounds. Extremities: no cyanosis, clubbing, rash, 2+  edema Neuro: intubated/sedated  Remains tenuous but improving. Continue IV diuresis. Wean NE and milrinone as tolerated based on swan numbers.   I turned A pacing off and he was in NSR in 60s but  dropped BP 10 points so a pacing at 80 restarted.   Will need DAPT at d/c.   CRITICAL CARE Performed by: Arvilla Meres  Total critical care time: 40 minutes  Critical care time was exclusive of separately billable procedures and treating other patients.  Critical care was necessary to treat or prevent imminent or life-threatening deterioration.  Critical care was time spent personally by me (independent of midlevel providers or residents) on the following activities: development of treatment plan with patient and/or surrogate as well as nursing, discussions with consultants, evaluation of patient's response to treatment, examination of patient, obtaining history from patient or surrogate, ordering and performing treatments and interventions, ordering and review of laboratory studies, ordering and review of radiographic studies, pulse oximetry and re-evaluation of patient's condition.  Arvilla Meres, MD  3:25 PM

## 2022-02-21 NOTE — Progress Notes (Signed)
TCTS DAILY ICU PROGRESS NOTE                   [address]            [place] [address]          [phone]   2 Days Post-Op Procedure(s) (LRB): CORONARY ARTERY BYPASS GRAFTING (CABG) X THREE, USING ENDOSCOPICALLY HARVESTED RIGHT GREATER SAPHENOUS VEIN  AND LEFT ATRIAL APPENDAGE CLIPPING (N/A)  Total Length of Stay:  LOS: 4 days   Subjective: Sedated on current, having some episodes of agitation which is affecting blood pressure with increased hypertension.  Objective: Vital signs in last 24 hours: Temp:  [96.1 F (35.6 C)-99.9 F (37.7 C)] 98.6 F (37 C) (12/29 0700) Pulse Rate:  [80-254] 80 (12/29 0700) Cardiac Rhythm: Atrial paced (12/28 2000) Resp:  [10-31] 20 (12/29 0700) BP: (77-176)/(47-117) 117/81 (12/29 0700) SpO2:  [92 %-100 %] 95 % (12/29 0700) Arterial Line BP: (43-208)/(30-88) 104/54 (12/29 0700) FiO2 (%):  [50 %-60 %] 60 % (12/29 0540) Weight:  [114.4 kg] 114.4 kg (12/29 0421)  Filed Weights   02/17/22 1500 02/20/22 0500 02/21/22 0421  Weight: 100.9 kg 115.3 kg 114.4 kg    Weight change: -0.9 kg   Hemodynamic parameters for last 24 hours: PAP: (25-43)/(15-28) 29/23 CVP:  [0 mmHg-16 mmHg] 13 mmHg PCWP:  [20 mmHg] 20 mmHg CO:  [3.8 L/min-5.8 L/min] 5.8 L/min CI:  [1.7 L/min/m2-2.6 L/min/m2] 2.6 L/min/m2   Vent Mode: SIMV;PRVC;PSV FiO2 (%):  [50 %-60 %] 60 % Set Rate:  [22 bmp] 22 bmp Vt Set:  [650 mL] 650 mL PEEP:  [5 cmH20-8 cmH20] 5 cmH20 Pressure Support:  [10 cmH20] 10 cmH20 Plateau Pressure:  [22 cmH20-26 cmH20] 22 cmH20 1 this is a he has another 1 he has not [employer] in Intake/Output from previous day: 12/28 0701 - 12/29 0700 In: 3729.8 [I.V.:3411.8; IV Piggyback:318.1] Out: 4405 [Urine:4150; Drains:55; Chest Tube:200]  Intake/Output this shift: No intake/output data recorded.  Current Meds: Scheduled Meds:  acetaminophen  1,000 mg Oral Q6H   Or   acetaminophen (TYLENOL) oral liquid 160 mg/5 mL  1,000 mg  Per Tube Q6H   aspirin EC  325 mg Oral Daily   Or   aspirin  324 mg Per Tube Daily   bisacodyl  10 mg Oral Daily   Or   bisacodyl  10 mg Rectal Daily   Chlorhexidine Gluconate Cloth  6 each Topical Daily   docusate  100 mg Per Tube BID   fentaNYL (SUBLIMAZE) injection  50 mcg Intravenous Once   furosemide  60 mg Intravenous BID   gabapentin  100 mg Per Tube Q12H   insulin aspart  0-24 Units Subcutaneous Q4H   pantoprazole (PROTONIX) IV  40 mg Intravenous Q24H   polyethylene glycol  17 g Per Tube Daily   sodium chloride flush  3 mL Intravenous Q12H   sodium zirconium cyclosilicate  10 g Per Tube BID   Continuous Infusions:  sodium chloride Stopped (02/21/22 0658)   sodium chloride     sodium chloride 10 mL/hr at 02/21/22 0700   amiodarone 30 mg/hr (02/21/22 0700)    ceFAZolin (ANCEF) IV Stopped (02/21/22 9628)   dexmedetomidine (PRECEDEX) IV infusion 1.2 mcg/kg/hr (02/21/22 0700)   epinephrine 5 mcg/min (02/21/22 0700)   fentaNYL infusion INTRAVENOUS 175 mcg/hr (02/21/22 0700)   insulin Stopped (02/20/22 1221)   milrinone 0.375 mcg/kg/min (02/21/22 0700)   norepinephrine (LEVOPHED) Adult infusion 5 mcg/min (02/21/22 0700)  PRN Meds:.sodium chloride, dextrose, fentaNYL, midazolam, morphine injection, ondansetron (ZOFRAN) IV, oxyCODONE, sodium chloride flush, traMADol  General appearance: Sedated on vent, no purposeful movement but some spontaneous with arms. Heart: Regular rate and rhythm loud rub with tubes in place Lungs: Fairly clear anteriorly Abdomen: Soft, nontender, nondistended Extremities: Some peripheral edema, Doppler DP/PT bilateral Wound: EVH site looks okay, chest dressing in place  Lab Results: CBC: Recent Labs    02/20/22 1621 02/21/22 0325  WBC 12.9* 10.7*  HGB 8.7* 8.2*  HCT 26.0* 23.4*  PLT 134* 113*   BMET:  Recent Labs    02/20/22 2059 02/21/22 0325  NA 135 139  K 4.3 4.2  CL 106 105  CO2 21* 22  GLUCOSE 151* 125*  BUN 23* 23*   CREATININE 2.53* 2.27*  CALCIUM 8.0* 8.3*    CMET: Lab Results  Component Value Date   WBC 10.7 (H) 02/21/2022   HGB 8.2 (L) 02/21/2022   HCT 23.4 (L) 02/21/2022   PLT 113 (L) 02/21/2022   GLUCOSE 125 (H) 02/21/2022   CHOL 179 11/17/2021   TRIG 284 (H) 11/17/2021   HDL 29 (L) 11/17/2021   LDLCALC 93 11/17/2021   ALT 20 11/16/2021   AST 29 11/16/2021   NA 139 02/21/2022   K 4.2 02/21/2022   CL 105 02/21/2022   CREATININE 2.27 (H) 02/21/2022   BUN 23 (H) 02/21/2022   CO2 22 02/21/2022   INR 1.3 (H) 02/19/2022   HGBA1C 5.8 (H) 05/18/2021      PT/INR:  Recent Labs    02/19/22 1719  LABPROT 15.9*  INR 1.3*   Radiology: No results found.   Assessment/Plan: S/P Procedure(s) (LRB): CORONARY ARTERY BYPASS GRAFTING (CABG) X THREE, USING ENDOSCOPICALLY HARVESTED RIGHT GREATER SAPHENOUS VEIN  AND LEFT ATRIAL APPENDAGE CLIPPING (N/A)  POD#2 1 afebrile, hemodynamically stable, inotropic/vasopressor support currently on epinephrine, milrinone drips.  Levophed is off.  He is on amiodarone drip.  Additionally remains intubated on Precedex and fentanyl.  Also on insulin drip.  He is a paced.  Advanced heart failure team has been consulted to assist with management.  EF is preserved on bedside echo and he is getting aggressively diuresed.  IABP remains in place.  Co. oximetry is 62.6.  Nephrology and critical care medicine consults are also in place. 2 excellent urine output, will need some further diuresis 3 chest tube drainage 200 cc/24H and bulb drain is 55 cc/24H, leave in place for now 4 blood sugars controlled on insulin drip in the 130-150s range 5 creatinine trending down, currently 2.27, electrolytes okay 6 leukocytosis trend continues to improve 7 expected acute blood loss anemia with decreasing trend, approaching transfusion threshold-continue to monitor closely 8 thrombocytopenia with decreasing trend platelet count 113 K currently monitor closely 9 chest x-ray-some  perihilar vascular fullness, some atelectasis 10 wean IABP and hopefully remove  later today 11 per Dr. Delia Chimes note-plan to add Plavix when the IABP is out, wires to be cut  Rowe Clack PA-C Pager [phone] 02/21/2022 7:11 AM

## 2022-02-21 NOTE — Progress Notes (Signed)
[facility] Progress Note   Subjective:   no acute events. Uop ~4.1L. K WNL now. Discussed with ICU RN.  Objective Vitals:   02/21/22 0815 02/21/22 0827 02/21/22 0830 02/21/22 0845  BP:   (!) 113/92   Pulse: 90 98 97 87  Resp: 20 20 20 20   Temp: 98.6 F (37 C) 98.6 F (37 C) 98.6 F (37 C) 98.6 F (37 C)  TempSrc:      SpO2: 97% 97% 98% 98%  Weight:      Height:       Physical Exam General: intubated sedated Heart: RRR on monitor Lungs: mech BS BL, 100% on FiO2 50% Abdomen: soft, nd/nt Extremities: warm, 1+ edema b/l LEs Neuro: sedated   Additional Objective Labs: Basic Metabolic Panel: Recent Labs  Lab 02/20/22 1621 02/20/22 2059 02/21/22 0325  NA 137 135 139  K 4.8 4.3 4.2  CL 104 106 105  CO2 20* 21* 22  GLUCOSE 148* 151* 125*  BUN 23* 23* 23*  CREATININE 2.61* 2.53* 2.27*  CALCIUM 8.2* 8.0* 8.3*   Liver Function Tests: No results for input(s): "AST", "ALT", "ALKPHOS", "BILITOT", "PROT", "ALBUMIN" in the last 168 hours. No results for input(s): "LIPASE", "AMYLASE" in the last 168 hours. CBC: Recent Labs  Lab 02/19/22 1719 02/19/22 1757 02/19/22 2144 02/19/22 2355 02/20/22 0420 02/20/22 0638 02/20/22 1621 02/21/22 0325  WBC 17.4*  --  11.3*  --  11.3*  --  12.9* 10.7*  HGB 11.6*  11.3*   < > 8.7*   < > 7.9* 8.8* 8.7* 8.2*  HCT 34.0*  33.4*   < > 24.9*   < > 22.9* 26.0* 26.0* 23.4*  MCV 91.3  --  90.2  --  90.2  --  90.6 88.0  PLT 166  --  154  --  148*  --  134* 113*   < > = values in this interval not displayed.   Blood Culture No results found for: "SDES", "SPECREQUEST", "CULT", "REPTSTATUS"  Cardiac Enzymes: Recent Labs  Lab 02/19/22 1800 02/20/22 0954  CKTOTAL 812* 1,249*   CBG: Recent Labs  Lab 02/20/22 1620 02/20/22 1958 02/20/22 2326 02/21/22 0322 02/21/22 0803  GLUCAP 157* 161* 151* 133* 132*   Iron Studies: No results for input(s): "IRON", "TIBC", "TRANSFERRIN", "FERRITIN" in the last 72  hours. @lablastinr3 @ Studies/Results: DG Chest Port 1 View  Result Date: 02/21/2022 CLINICAL DATA:  Status post CABG and atrial clipping. EXAM: PORTABLE CHEST 1 VIEW COMPARISON:  One-view chest x-ray 02/20/2022 FINDINGS: Endotracheal tube is stable at the level clavicles. NG tube terminates in the stomach. Swan-Ganz catheter enters via right IJ approach in terminates in the right main pulmonary outflow tract. Left-sided chest tube and mediastinal drains are stable. A left pleural effusion has increased. Patchy airspace opacity in the left lung is similar the prior exam. No pneumothorax is present. Lung volumes are low. IMPRESSION: 1. Increasing left pleural effusion. 2. Stable patchy airspace opacity in the left lung, likely reflecting atelectasis. 3. Support apparatus is stable. 4. No pneumothorax. Electronically Signed   By: San Morelle M.D.   On: 02/21/2022 08:14   DG Chest Port 1 View  Result Date: 02/20/2022 CLINICAL DATA:  CABG. EXAM: PORTABLE CHEST 1 VIEW COMPARISON:  Chest 02/19/2022 FINDINGS: Endotracheal tube in good position. Swan-Ganz catheter in the main pulmonary artery unchanged. NG tube enters the stomach with the tip not visualized. Left chest tube in place. No pneumothorax. Left atrial appendage clip unchanged. Metal marker over the  aortic arch possible intra-aortic balloon pump unchanged. Progression of bilateral airspace disease left greater than right. Small bilateral effusions. No definite edema. IMPRESSION: 1. Support lines remain in good position. 2. Progression of bilateral airspace disease left greater than right. Probable edema. Electronically Signed   By: Marlan Palau M.D.   On: 02/20/2022 07:57   DG Abd 1 View  Result Date: 02/19/2022 CLINICAL DATA:  Orogastric tube placement EXAM: ABDOMEN - 1 VIEW COMPARISON:  None Available. FINDINGS: Subdiaphragmatic, right flank, and inferior pelvic regions are excluded from view. Nasogastric tube tip overlies the expected  gastric fundus. Visualized abdominal gas pattern is nonobstructive. IMPRESSION: 1. Nasogastric tube tip within the gastric fundus. Electronically Signed   By: Helyn Numbers M.D.   On: 02/19/2022 20:36   DG Chest Port 1 View  Result Date: 02/19/2022 CLINICAL DATA:  Status post CABG EXAM: PORTABLE CHEST 1 VIEW COMPARISON:  02/11/2022 FINDINGS: Single frontal view of the chest demonstrates endotracheal tube overlying tracheal air column tip at level of thoracic inlet. Enteric catheter passes below diaphragm, tip excluded by collimation. The side port of the enteric catheter projects approximately 5 cm above the gastroesophageal junction. There is a right internal jugular flow directed central venous catheter tip overlying the main pulmonary outflow tract. Median sternotomy wires and mediastinal clips are noted. The cardiac silhouette is stable. Lung volumes are diminished, with crowding the central vasculature. Patchy bibasilar atelectasis. No effusion or pneumothorax. IMPRESSION: 1. Support devices as above. 2. Low lung volumes. Electronically Signed   By: Sharlet Salina M.D.   On: 02/19/2022 17:47   Medications:  sodium chloride 20 mL/hr at 02/21/22 0800   sodium chloride     sodium chloride 10 mL/hr at 02/21/22 0700   amiodarone 30 mg/hr (02/21/22 0800)    ceFAZolin (ANCEF) IV Stopped (02/21/22 6144)   dexmedetomidine (PRECEDEX) IV infusion 1.2 mcg/kg/hr (02/21/22 0800)   epinephrine 4 mcg/min (02/21/22 0800)   fentaNYL infusion INTRAVENOUS 175 mcg/hr (02/21/22 0800)   insulin Stopped (02/20/22 1221)   milrinone 0.375 mcg/kg/min (02/21/22 0800)   norepinephrine (LEVOPHED) Adult infusion 5 mcg/min (02/21/22 0700)    acetaminophen  1,000 mg Oral Q6H   Or   acetaminophen (TYLENOL) oral liquid 160 mg/5 mL  1,000 mg Per Tube Q6H   aspirin EC  325 mg Oral Daily   Or   aspirin  324 mg Per Tube Daily   bisacodyl  10 mg Oral Daily   Or   bisacodyl  10 mg Rectal Daily   Chlorhexidine Gluconate  Cloth  6 each Topical Daily   docusate  100 mg Per Tube BID   fentaNYL (SUBLIMAZE) injection  50 mcg Intravenous Once   furosemide  80 mg Intravenous BID   gabapentin  100 mg Per Tube Q12H   insulin aspart  0-24 Units Subcutaneous Q4H   pantoprazole (PROTONIX) IV  40 mg Intravenous Q24H   polyethylene glycol  17 g Per Tube Daily   sodium chloride flush  3 mL Intravenous Q12H    Assessment/Plan:  Hyperkalemia -   He certainly has multiple risk factors for hyperkalemia (AKI, mild CKD, K fluids, transfusions, poss hypoperfusion, mildly elevated CK) but the degree and timing are a bit out of proportion to expected.  Concern exists for atypical malignant hyperthermia and is being explored (rec'd dantrolene). Heparin has been implicated in hyperkalemia but it's through reduced aldo synthesis and not an immediate effect.  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  Currently on levo, vasopressin, milrinone, and epi drip. VDRF - per PCCM AKI on CKD stage IIIa - Baseline Cr 1.3 now with AKI in setting after CABG, brief arrest.   Cr down to 2.27, responding well to diuretics. ABLA - transfused to 8s; follow Cocaine abuse - will need counseling on cessation.    Gean Quint, MD [facility]

## 2022-02-22 LAB — BPAM RBC
Blood Product Expiration Date: 202401212359
Blood Product Expiration Date: 202401212359
Blood Product Expiration Date: 202401242359
Blood Product Expiration Date: 202401242359
Blood Product Expiration Date: 202401242359
Blood Product Expiration Date: 202401242359
ISSUE DATE / TIME: 202312271354
ISSUE DATE / TIME: 202312271354
ISSUE DATE / TIME: 202312271423
ISSUE DATE / TIME: 202312271423
ISSUE DATE / TIME: 202312280321
Unit Type and Rh: 5100
Unit Type and Rh: 5100
Unit Type and Rh: 5100
Unit Type and Rh: 5100
Unit Type and Rh: 5100
Unit Type and Rh: 5100

## 2022-02-22 LAB — CBC
HCT: 23.3 % — ABNORMAL LOW (ref 39.0–52.0)
Hemoglobin: 8 g/dL — ABNORMAL LOW (ref 13.0–17.0)
MCH: 31 pg (ref 26.0–34.0)
MCHC: 34.3 g/dL (ref 30.0–36.0)
MCV: 90.3 fL (ref 80.0–100.0)
Platelets: 122 10*3/uL — ABNORMAL LOW (ref 150–400)
RBC: 2.58 MIL/uL — ABNORMAL LOW (ref 4.22–5.81)
RDW: 15.1 % (ref 11.5–15.5)
WBC: 12.9 10*3/uL — ABNORMAL HIGH (ref 4.0–10.5)
nRBC: 0.2 % (ref 0.0–0.2)

## 2022-02-22 LAB — TYPE AND SCREEN
ABO/RH(D): O POS
Antibody Screen: NEGATIVE
Unit division: 0
Unit division: 0
Unit division: 0
Unit division: 0
Unit division: 0
Unit division: 0

## 2022-02-22 LAB — HEPATIC FUNCTION PANEL
ALT: 11 U/L (ref 0–44)
AST: 31 U/L (ref 15–41)
Albumin: 3.1 g/dL — ABNORMAL LOW (ref 3.5–5.0)
Alkaline Phosphatase: 48 U/L (ref 38–126)
Bilirubin, Direct: <0.1 mg/dL (ref 0.0–0.2)
Total Bilirubin: 0.5 mg/dL (ref 0.3–1.2)
Total Protein: 5.8 g/dL — ABNORMAL LOW (ref 6.5–8.1)

## 2022-02-22 LAB — COOXEMETRY PANEL
Carboxyhemoglobin: 1.7 % — ABNORMAL HIGH (ref 0.5–1.5)
Carboxyhemoglobin: 1.3 % (ref 0.5–1.5)
Methemoglobin: <0.7 % (ref 0.0–1.5)
Methemoglobin: <0.7 % (ref 0.0–1.5)
O2 Saturation: 52.6 %
O2 Saturation: 44.6 %
Total hemoglobin: 7.9 g/dL — ABNORMAL LOW (ref 12.0–16.0)
Total hemoglobin: 8.6 g/dL — ABNORMAL LOW (ref 12.0–16.0)

## 2022-02-22 LAB — BASIC METABOLIC PANEL
Anion gap: 10 (ref 5–15)
BUN: 25 mg/dL — ABNORMAL HIGH (ref 6–20)
CO2: 23 mmol/L (ref 22–32)
Calcium: 8.4 mg/dL — ABNORMAL LOW (ref 8.9–10.3)
Chloride: 107 mmol/L (ref 98–111)
Creatinine, Ser: 2.19 mg/dL — ABNORMAL HIGH (ref 0.61–1.24)
GFR, Estimated: 36 mL/min — ABNORMAL LOW (ref >60)
Glucose, Bld: 119 mg/dL — ABNORMAL HIGH (ref 70–99)
Potassium: 4.1 mmol/L (ref 3.5–5.1)
Sodium: 140 mmol/L (ref 135–145)

## 2022-02-22 LAB — GLUCOSE, CAPILLARY
Glucose-Capillary: 104 mg/dL — ABNORMAL HIGH (ref 70–99)
Glucose-Capillary: 107 mg/dL — ABNORMAL HIGH (ref 70–99)
Glucose-Capillary: 108 mg/dL — ABNORMAL HIGH (ref 70–99)
Glucose-Capillary: 120 mg/dL — ABNORMAL HIGH (ref 70–99)
Glucose-Capillary: 122 mg/dL — ABNORMAL HIGH (ref 70–99)
Glucose-Capillary: 125 mg/dL — ABNORMAL HIGH (ref 70–99)
Glucose-Capillary: 98 mg/dL (ref 70–99)

## 2022-02-22 MED ORDER — LIDOCAINE 5 % EX PTCH
2.0000 | MEDICATED_PATCH | CUTANEOUS | Status: DC
  Administered 2022-02-22 – 2022-03-01 (×7): 2 via TRANSDERMAL
  Filled 2022-02-22 (×7): qty 2

## 2022-02-22 MED ORDER — GABAPENTIN 100 MG PO CAPS
200.0000 mg | ORAL_CAPSULE | Freq: Two times a day (BID) | ORAL | Status: DC
  Administered 2022-02-22 – 2022-03-01 (×15): 200 mg via ORAL
  Filled 2022-02-22 (×15): qty 2

## 2022-02-22 MED ORDER — ROSUVASTATIN CALCIUM 20 MG PO TABS
40.0000 mg | ORAL_TABLET | Freq: Every day | ORAL | Status: DC
  Administered 2022-02-22 – 2022-03-01 (×8): 40 mg via ORAL
  Filled 2022-02-22 (×8): qty 2

## 2022-02-22 MED ORDER — HEPARIN SODIUM (PORCINE) 5000 UNIT/ML IJ SOLN
5000.0000 [IU] | Freq: Three times a day (TID) | INTRAMUSCULAR | Status: DC
  Administered 2022-02-22 – 2022-03-01 (×21): 5000 [IU] via SUBCUTANEOUS
  Filled 2022-02-22 (×21): qty 1

## 2022-02-22 MED ORDER — AMIODARONE HCL 200 MG PO TABS
400.0000 mg | ORAL_TABLET | Freq: Two times a day (BID) | ORAL | Status: DC
  Administered 2022-02-22 – 2022-02-25 (×8): 400 mg via ORAL
  Filled 2022-02-22 (×8): qty 2

## 2022-02-22 NOTE — Progress Notes (Addendum)
Patient ID: Logan Crawford, male   DOB: 05-05-1975, 46 y.o.   MRN: [id number]     Advanced Heart Failure Rounding Note  PCP-Cardiologist: Debbe Odea, MD   Subjective:    Off epinephrine and NE.  Ernestine Conrad out and extubated.  Remains on milrinone 0.125.  SBP up and down, currently high with mild agitation due to dyspnea.  Excellent diuresis yesterday, weight down 8 lbs.  Creatinine 2.27 => 2.19.   A-paced at 80 bpm. I decreased to back-up rate 40, underlying NSR upper 60s, BP remained stable.   SWAN #s CVP 16-17 PAP 28/15 CI 2.5 CO-OX 53%  Echo (12/29): EF 50-55%, RV normal, mild MR, mild AI.    Objective:   Weight Range: 110.7 kg Body mass index is 33.1 kg/m.   Vital Signs:   Temp:  [98.4 F (36.9 C)-100.2 F (37.9 C)] 99 F (37.2 C) (12/30 0700) Pulse Rate:  [79-98] 80 (12/30 0700) Resp:  [7-32] 19 (12/30 0700) BP: (92-218)/(48-117) 115/71 (12/30 0700) SpO2:  [74 %-99 %] 94 % (12/30 0700) Arterial Line BP: (87-283)/(30-106) 132/55 (12/30 0700) FiO2 (%):  [36 %-60 %] 36 % (12/29 1731) Weight:  [110.7 kg] 110.7 kg (12/30 0500) Last BM Date : 02/18/22  Weight change: Filed Weights   02/20/22 0500 02/21/22 0421 02/22/22 0500  Weight: 115.3 kg 114.4 kg 110.7 kg    Intake/Output:   Intake/Output Summary (Last 24 hours) at 02/22/2022 0800 Last data filed at 02/22/2022 0700 Gross per 24 hour  Intake 2973.01 ml  Output 4890 ml  Net -1916.99 ml      Physical Exam    General: NAD Neck: JVP 12, no thyromegaly or thyroid nodule.  Lungs: Decreased at bases.  CV: Nondisplaced PMI.  Heart regular S1/S2, no S3/S4, no murmur.  1+ edema to knees.  Abdomen: Soft, nontender, no hepatosplenomegaly, no distention.  Skin: Intact without lesions or rashes.  Neurologic: Alert and oriented x 3.  Psych: Normal affect. Extremities: No clubbing or cyanosis.  HEENT: Normal.    Telemetry   Atrial paced 80s (personally reviewed)   Labs    CBC Recent Labs     02/21/22 0325 02/22/22 0426  WBC 10.7* 12.9*  HGB 8.2* 8.0*  HCT 23.4* 23.3*  MCV 88.0 90.3  PLT 113* 122*   Basic Metabolic Panel Recent Labs    40/98/11 0420 02/20/22 0638 02/20/22 1621 02/20/22 2059 02/21/22 0325 02/21/22 1109 02/21/22 1500 02/22/22 0426  NA 137   < > 137   < > 139  --   --  140  K 6.1*   < > 4.8   < > 4.2   < > 4.1 4.1  CL 105   < > 104   < > 105  --   --  107  CO2 22   < > 20*   < > 22  --   --  23  GLUCOSE 153*   < > 148*   < > 125*  --   --  119*  BUN 18   < > 23*   < > 23*  --   --  25*  CREATININE 2.34*   < > 2.61*   < > 2.27*  --   --  2.19*  CALCIUM 7.4*   < > 8.2*   < > 8.3*  --   --  8.4*  MG 2.5*  --  2.5*  --   --   --   --   --    < > =  values in this interval not displayed.   Liver Function Tests Recent Labs    02/22/22 0426  AST 31  ALT 11  ALKPHOS 48  BILITOT 0.5  PROT 5.8*  ALBUMIN 3.1*   No results for input(s): "LIPASE", "AMYLASE" in the last 72 hours. Cardiac Enzymes Recent Labs    02/19/22 1800 02/20/22 0954  CKTOTAL 812* 1,249*    BNP: BNP (last 3 results) Recent Labs    06/27/21 1707 11/17/21 0043  BNP 18.9 23.6    ProBNP (last 3 results) No results for input(s): "PROBNP" in the last 8760 hours.   D-Dimer No results for input(s): "DDIMER" in the last 72 hours. Hemoglobin A1C No results for input(s): "HGBA1C" in the last 72 hours. Fasting Lipid Panel No results for input(s): "CHOL", "HDL", "LDLCALC", "TRIG", "CHOLHDL", "LDLDIRECT" in the last 72 hours. Thyroid Function Tests No results for input(s): "TSH", "T4TOTAL", "T3FREE", "THYROIDAB" in the last 72 hours.  Invalid input(s): "FREET3"  Other results:   Imaging    ECHOCARDIOGRAM COMPLETE  Result Date: 02/21/2022    ECHOCARDIOGRAM REPORT   Patient Name:   Logan Crawford Date of Exam: 02/21/2022 Medical Rec #:  [id number]        Height:       72.0 in Accession #:    [id number]       Weight:       252.2 lb Date of Birth:  10/21/1975        BSA:          2.351 m Patient Age:    46 years         BP:           114/87 mmHg Patient Gender: M                HR:           80 bpm. Exam Location:  Inpatient Procedure: 2D Echo, Cardiac Doppler, Color Doppler and Intracardiac            Opacification Agent Indications:    NSTEMI                 CHF  History:        Patient has prior history of Echocardiogram examinations, most                 recent 02/19/2022. Risk Factors:Current Smoker and Hypertension.                 S/P CABG 02/19/22. Cocaine abuse.  Sonographer:    Ross Ludwig RDCS (AE) Referring Phys: [facility]  Sonographer Comments: Echo performed with patient supine and on artificial respirator and suboptimal parasternal window. Bandages covering subcostal area. IMPRESSIONS  1. Thickening of the aortic vavle and mitral valve was compared to prior transthoracic echoes and is unchanged. It was not present on TEE, and is there likely artifactual.  2. Left ventricular ejection fraction, by estimation, is 50 to 55%. The left ventricle has low normal function. The left ventricle demonstrates regional wall motion abnormalities (see scoring diagram/findings for description). There is mild left ventricular hypertrophy of the lateral segment. Left ventricular diastolic parameters are consistent with Grade II diastolic dysfunction (pseudonormalization). Elevated left ventricular end-diastolic pressure.  3. Right ventricular systolic function is normal. The right ventricular size is normal.  4. The mitral valve is normal in structure. Mild mitral valve regurgitation. No evidence of mitral stenosis.  5. The aortic valve has an indeterminant number of cusps. There  is mild calcification of the aortic valve. There is mild thickening of the aortic valve. Aortic valve regurgitation is mild. No aortic stenosis is present.  6. Aortic dilatation noted. There is mild dilatation of the aortic root, measuring 36 mm.  7. The inferior vena cava is normal in size with  greater than 50% respiratory variability, suggesting right atrial pressure of 3 mmHg. FINDINGS  Left Ventricle: Left ventricular ejection fraction, by estimation, is 50 to 55%. The left ventricle has low normal function. The left ventricle demonstrates regional wall motion abnormalities. Definity contrast agent was given IV to delineate the left ventricular endocardial borders. The left ventricular internal cavity size was normal in size. There is mild left ventricular hypertrophy of the lateral segment. Left ventricular diastolic parameters are consistent with Grade II diastolic dysfunction (pseudonormalization). Elevated left ventricular end-diastolic pressure.  LV Wall Scoring: The anterior septum is hypokinetic. The entire anterior wall, entire lateral wall, inferior septum, entire apex, and entire inferior wall are normal. Right Ventricle: The right ventricular size is normal. No increase in right ventricular wall thickness. Right ventricular systolic function is normal. Left Atrium: Left atrial size was normal in size. Right Atrium: Right atrial size was normal in size. Pericardium: There is no evidence of pericardial effusion. Mitral Valve: The mitral valve is normal in structure. Mild mitral annular calcification. Mild mitral valve regurgitation. No evidence of mitral valve stenosis. Tricuspid Valve: The tricuspid valve is normal in structure. Tricuspid valve regurgitation is trivial. No evidence of tricuspid stenosis. Aortic Valve: The aortic valve has an indeterminant number of cusps. There is mild calcification of the aortic valve. There is mild thickening of the aortic valve. Aortic valve regurgitation is mild. No aortic stenosis is present. Aortic valve mean gradient measures 3.0 mmHg. Aortic valve peak gradient measures 5.8 mmHg. Aortic valve area, by VTI measures 3.49 cm. Pulmonic Valve: The pulmonic valve was normal in structure. Pulmonic valve regurgitation is not visualized. No evidence of  pulmonic stenosis. Aorta: Aortic dilatation noted. There is mild dilatation of the aortic root, measuring 36 mm. Venous: The inferior vena cava is normal in size with greater than 50% respiratory variability, suggesting right atrial pressure of 3 mmHg. IAS/Shunts: No atrial level shunt detected by color flow Doppler.  LEFT VENTRICLE PLAX 2D LVIDd:         5.10 cm      Diastology LVIDs:         3.40 cm      LV e' medial:    4.46 cm/s LV PW:         1.50 cm      LV E/e' medial:  23.1 LV IVS:        1.00 cm      LV e' lateral:   3.81 cm/s LVOT diam:     2.30 cm      LV E/e' lateral: 27.0 LV SV:         59 LV SV Index:   25 LVOT Area:     4.15 cm  LV Volumes (MOD) LV vol d, MOD A2C: 114.0 ml LV vol d, MOD A4C: 121.0 ml LV vol s, MOD A2C: 58.3 ml LV vol s, MOD A4C: 77.0 ml LV SV MOD A2C:     55.7 ml LV SV MOD A4C:     121.0 ml LV SV MOD BP:      50.9 ml RIGHT VENTRICLE RV Basal diam:  2.70 cm RV S prime:     6.31 cm/s TAPSE (  M-mode): 0.9 cm LEFT ATRIUM           Index        RIGHT ATRIUM           Index LA diam:      3.80 cm 1.62 cm/m   RA Area:     12.10 cm LA Vol (A2C): 38.7 ml 16.46 ml/m  RA Volume:   22.50 ml  9.57 ml/m LA Vol (A4C): 57.6 ml 24.50 ml/m  AORTIC VALVE AV Area (Vmax):    3.45 cm AV Area (Vmean):   3.21 cm AV Area (VTI):     3.49 cm AV Vmax:           120.00 cm/s AV Vmean:          82.600 cm/s AV VTI:            0.168 m AV Peak Grad:      5.8 mmHg AV Mean Grad:      3.0 mmHg LVOT Vmax:         99.70 cm/s LVOT Vmean:        63.900 cm/s LVOT VTI:          0.141 m LVOT/AV VTI ratio: 0.84  AORTA Ao Root diam: 3.60 cm MITRAL VALVE                TRICUSPID VALVE MV Area (PHT): 2.99 cm     TR Peak grad:   13.2 mmHg MV Decel Time: 254 msec     TR Vmax:        182.00 cm/s MV E velocity: 103.00 cm/s MV A velocity: 61.00 cm/s   SHUNTS MV E/A ratio:  1.69         Systemic VTI:  0.14 m                             Systemic Diam: 2.30 cm Chilton Siiffany Aberdeen MD Electronically signed by Chilton Siiffany Sinton MD  Signature Date/Time: 02/21/2022/11:05:18 AM    Final      Medications:     Scheduled Medications:  acetaminophen  1,000 mg Oral Q6H   Or   acetaminophen (TYLENOL) oral liquid 160 mg/5 mL  1,000 mg Per Tube Q6H   aspirin EC  325 mg Oral Daily   Or   aspirin  324 mg Per Tube Daily   bisacodyl  10 mg Oral Daily   Or   bisacodyl  10 mg Rectal Daily   Chlorhexidine Gluconate Cloth  6 each Topical Daily   furosemide  80 mg Intravenous BID   gabapentin  100 mg Oral BID   insulin aspart  0-24 Units Subcutaneous Q4H   pantoprazole (PROTONIX) IV  40 mg Intravenous Q24H   sodium chloride flush  3 mL Intravenous Q12H    Infusions:  sodium chloride 10 mL/hr at 02/22/22 0700   sodium chloride     sodium chloride 10 mL/hr at 02/22/22 0600   amiodarone 30 mg/hr (02/22/22 0700)   dexmedetomidine (PRECEDEX) IV infusion Stopped (02/21/22 1645)   epinephrine Stopped (02/22/22 0651)   milrinone 0.125 mcg/kg/min (02/22/22 0700)   norepinephrine (LEVOPHED) Adult infusion 5 mcg/min (02/21/22 2000)    PRN Medications: sodium chloride, labetalol, midazolam, morphine injection, ondansetron (ZOFRAN) IV, oxyCODONE, sodium chloride flush, traMADol   Assessment/Plan    1. Cardiogenic shock, post cardiotomy -S/p CABG 12/27. CHB and severe hyperkalemia prior to placement on cardiopulmonary bypass. Placed on CPB urgently.  -PreCABG  echo EF 55% -Echo 12/29 with EF 50-55%, mild AI, mild MR.  -IABP out 12/29.  -CI 2.5 this am on milrinone 0.125, off NE and epinephrine. Creatinine trending down, continue milrinone for now for RV support at current dose.  Good CI, co-ox low early am but will resend.  -CVP 16-17. Good diuresis yesterday with Lasix 80 mg IV bid, weight down.  Remains volume overloaded and dyspneic, continue IV Lasix 80 mg bid with dose now.  -GDMT once off pressors   2. CAD with NSTEMI -Hx prior PCI/stent to LAD in 2021 and PCI/stent to LAD in 03/23 d/t in-stent restenosis -NSTEMI this  admit. S/p CABG X 3 (LIMA to Diag, SVG to LAD, SVG to RCA) -Aspirin + statin -Admitted with NSTEMI, start Plavix when ok with TCTS.   3. AKI on CKD -Baseline Scr 1.1-1.3, 1.6 pre-op -Cr up to 2.5, starting to trend down. 2.19 this am -Support CO, continue milrinone 0.125 for now for RV.  -Watch with diuresis.   4. Severe Hyperkalemia -Etiology uncertain -Occurred prior to initation CPB. Received dantrolene. Some initial concern for malignant hyperthermia.  -Resolved, K now normal.    5. CHB -Occurred prior to placement on CPB, in setting of hyperkalemia -I decreased a-pacing rate to 40, he is in NSR in upper 60s, BP stable.  Will leave like this for now.    6. Polysubstance abuse -Tobacco and cocaine -UDS + for cocaine on admit   7. SDOH: -Hx noncompliance and substance abuse -Uninsured. Will need to engage TOC CSW/CM.    CRITICAL CARE Performed by: Marca Ancona  Total critical care time: 40 minutes  Critical care time was exclusive of separately billable procedures and treating other patients.  Critical care was necessary to treat or prevent imminent or life-threatening deterioration.  Critical care was time spent personally by me (independent of midlevel providers or residents) on the following activities: development of treatment plan with patient and/or surrogate as well as nursing, discussions with consultants, evaluation of patient's response to treatment, examination of patient, obtaining history from patient or surrogate, ordering and performing treatments and interventions, ordering and review of laboratory studies, ordering and review of radiographic studies, pulse oximetry and re-evaluation of patient's condition.  Marca Ancona, MD  8:00 AM

## 2022-02-22 NOTE — Progress Notes (Signed)
NAME:  Logan Crawford, MRN:  [id number], DOB:  10-16-1975, LOS: 5 ADMISSION DATE:  02/17/2022, CONSULTATION DATE:  12/27 REFERRING MD:  12/27, CHIEF COMPLAINT:  post-op CABG  History of Present Illness:  46 yom w/ hx as outlined below had known 2V CAD presented to ER 12/21 w/ CP c/w prior NSTEMI. Left Heart cath showed re-stenosis of LAD (felt 2/2 non-compliance w DAPT and cocaine abuse). Also found to have sig diseased RCA. Presents to the CVICU 12/27 s/p CABG X 3V w/ placement of IABP, had brief CHB intraoperative w/ potassium noted around 6.8 and pt placed on bypass urgently  PCCM asked to assist w/ post op care   Pertinent  Medical History  CAD . 11/2019 NSTEMI/PCI: LM min irregs, LAD 58m (2.75x22 Resolute Onyx DES)  HFpEF 60-65% HTN, HL, tobacco abuse  Polysubstance abuse.  Significant Hospital Events: Including procedures, antibiotic start and stop dates in addition to other pertinent events   12/21 admitted. Left heart cath: Progression of Severe Multivessel: - 2 potential culprit lesions: distal-mid RCA ulcerated 95% ruptured plaque (mid #2), distal mid LAD stent edge 99% with diffuse disease elsewhere RCA:-proximal #1 70%, followed by proximal #2 70%, mid #1 1 70% and mid #2 95%, ostial PDA 60% LAD: Long stented area with proximal concentric 70% are, jailed D2 85% Zio, distal stent focal 80% followed by segmental 50% then 99% at distal edge. LCx-small OM1 and OM 2 with bifurcating OM 3, AVG circumflex with small branches  12/26 ECHO:  1. Small area of distal septal/inferior basal hypokinesis . Left  ventricular ejection fraction, by estimation, is 55%. The left ventricle  has normal function. The left ventricle has no regional wall motion  abnormalities. There is mild left ventricular hypertrophy. Left ventricular diastolic parameters are consistent with  Grade I diastolic dysfunction (impaired relaxation).  Right ventricular systolic function is normal.  40/98 3 V CABG and  placement of IABP by Enter. Intra-op received 2 units PRBC. Consulted Nephro, 1uPRBC overnight 12/28 - SIMV/PS received 1 unit of blood 12/29 IABP removed and extubated. Potassium has corrected, renal failure is improving.   Interim History / Subjective:  Extubated but complaining of dyspnea.   Objective   Blood pressure 115/71, pulse 80, temperature 99 F (37.2 C), resp. rate 19, height 6' (1.829 m), weight 110.7 kg, SpO2 94 %. PAP: (14-45)/(1-31) 30/16 CVP:  [9 mmHg-13 mmHg] 11 mmHg CO:  [4.8 L/min-7.7 L/min] 5.5 L/min CI:  [2.1 L/min/m2-3.5 L/min/m2] 2.5 L/min/m2  Vent Mode: SIMV;PRVC;PSV FiO2 (%):  [36 %-60 %] 36 % Set Rate:  [20 bmp] 20 bmp Vt Set:  [650 mL] 650 mL PEEP:  [5 cmH20] 5 cmH20 Pressure Support:  [10 cmH20] 10 cmH20 Plateau Pressure:  [22 cmH20] 22 cmH20   Intake/Output Summary (Last 24 hours) at 02/22/2022 0841 Last data filed at 02/22/2022 0700 Gross per 24 hour  Intake 2973.01 ml  Output 4890 ml  Net -1916.99 ml    Filed Weights   02/20/22 0500 02/21/22 0421 02/22/22 0500  Weight: 115.3 kg 114.4 kg 110.7 kg   Physical Examination: General Lying in bed, appears anxious and uncomfortable.  HEENT normocephalic atraumatic  Pulmonary: bronchial breath sounds at left base. No wheezing. Only able to pull 250 on IS, rapid panting breaths.  Cardiac: Regular rate and rhythm, HS normal with no rub Abdomen soft Extremities warm with brisk cap refill pulses palpable Neuro intact  Ancillary tests personally reviewed:  Creatinine 2.19 HB 8.0 PLT 122 Assessment & Plan:  NSTEMI w/ Severe multivessel CAD, now s/p 3 V CABG 12/27 w/ post-operative cardiogenic shock/vasoplegia w/ IABP - Continue milrinone to support diuresis today. Adequate CI  Acute hypoxic respiratory failure due to pulmonary edema and atelectasis - Diuresis -Multimodal pain control, add gabapentin and lidocaine patches - Incentive spirometry  CKD stage 2 with severe unexplained hyperkalemia  intraoperatively. - Hyperkalemia resolved, cause unclear. Unlikely MH per conversation with [facility] consultation.  - Follow creatinine   Post-op anemia - Transfuse for HB <7.0  Secondary cardiac prevention H/o tobacco abuse and poly-substance abuse -Cessation support once appropriate -High dose statin - ASA now, Plavix at discharge.  - Appears to have fairly significant hypertension. - was on carvedilol and Imdur preop.  - Prediabetes with Hba1c of 5. 8. Carb modified diet.  Best Practice (right click and "Reselect all SmartList Selections" daily)   Diet/type: progressive diet DVT prophylaxis: heparin GI prophylaxis: H2B Lines: Central line Foley:  Yes, and it is still needed Code Status:  full code Last date of multidisciplinary goals of care discussion [pending]  CRITICAL CARE Performed by: Lynnell Catalan   Total critical care time: 35 minutes  Critical care time was exclusive of separately billable procedures and treating other patients.  Critical care was necessary to treat or prevent imminent or life-threatening deterioration.  Critical care was time spent personally by me on the following activities: development of treatment plan with patient and/or surrogate as well as nursing, discussions with consultants, evaluation of patient's response to treatment, examination of patient, obtaining history from patient or surrogate, ordering and performing treatments and interventions, ordering and review of laboratory studies, ordering and review of radiographic studies, pulse oximetry, re-evaluation of patient's condition and participation in multidisciplinary rounds.  Lynnell Catalan, MD [facility] ICU Physician [facility]  Pager: [phone] Mobile: [phone] After hours: [phone].

## 2022-02-22 NOTE — Progress Notes (Signed)
[facility] Progress Note   Subjective:   no acute events. Now extubated but does report SOB this AM. Uncle at bedside. Receiving lasix 80 mg IV BID, uop ~4.1L  Objective Vitals:   02/22/22 0615 02/22/22 0630 02/22/22 0645 02/22/22 0700  BP:  121/77  115/71  Pulse: 80 80 80 80  Resp: 18 17 (!) 22 19  Temp: 99.1 F (37.3 C) 99.1 F (37.3 C) 99.3 F (37.4 C) 99 F (37.2 C)  TempSrc:      SpO2: 95% 95% 93% 94%  Weight:      Height:       Physical Exam General: awake Heart: RRR on monitor Lungs: slightly tachypneic, decreased breath sounds bibasilar Abdomen: soft, nd/nt Extremities: warm, trace to 1+  edema b/l LEs Neuro: sedated   Additional Objective Labs: Basic Metabolic Panel: Recent Labs  Lab 02/20/22 2059 02/21/22 0325 02/21/22 1109 02/21/22 1500 02/22/22 0426  NA 135 139  --   --  140  K 4.3 4.2 4.3 4.1 4.1  CL 106 105  --   --  107  CO2 21* 22  --   --  23  GLUCOSE 151* 125*  --   --  119*  BUN 23* 23*  --   --  25*  CREATININE 2.53* 2.27*  --   --  2.19*  CALCIUM 8.0* 8.3*  --   --  8.4*   Liver Function Tests: Recent Labs  Lab 02/22/22 0426  AST 31  ALT 11  ALKPHOS 48  BILITOT 0.5  PROT 5.8*  ALBUMIN 3.1*   No results for input(s): "LIPASE", "AMYLASE" in the last 168 hours. CBC: Recent Labs  Lab 02/19/22 2144 02/19/22 2355 02/20/22 0420 02/20/22 2836 02/20/22 1621 02/21/22 0325 02/22/22 0426  WBC 11.3*  --  11.3*  --  12.9* 10.7* 12.9*  HGB 8.7*   < > 7.9*   < > 8.7* 8.2* 8.0*  HCT 24.9*   < > 22.9*   < > 26.0* 23.4* 23.3*  MCV 90.2  --  90.2  --  90.6 88.0 90.3  PLT 154  --  148*  --  134* 113* 122*   < > = values in this interval not displayed.   Blood Culture No results found for: "SDES", "SPECREQUEST", "CULT", "REPTSTATUS"  Cardiac Enzymes: Recent Labs  Lab 02/19/22 1800 02/20/22 0954  CKTOTAL 812* 1,249*   CBG: Recent Labs  Lab 02/21/22 1503 02/21/22 2022 02/22/22 0009 02/22/22 0403  02/22/22 0802  GLUCAP 117* 133* 125* 122* 104*   Iron Studies: No results for input(s): "IRON", "TIBC", "TRANSFERRIN", "FERRITIN" in the last 72 hours. @lablastinr3 @ Studies/Results: ECHOCARDIOGRAM COMPLETE  Result Date: 02/21/2022    ECHOCARDIOGRAM REPORT   Patient Name:   Logan Crawford Date of Exam: 02/21/2022 Medical Rec #:  02/23/2022        Height:       72.0 in Accession #:    [id number]       Weight:       252.2 lb Date of Birth:  [birth year]       BSA:          2.351 m Patient Age:    46 years         BP:           114/87 mmHg Patient Gender: M                HR:  80 bpm. Exam Location:  Inpatient Procedure: 2D Echo, Cardiac Doppler, Color Doppler and Intracardiac            Opacification Agent Indications:    NSTEMI                 CHF  History:        Patient has prior history of Echocardiogram examinations, most                 recent 02/19/2022. Risk Factors:Current Smoker and Hypertension.                 S/P CABG 02/19/22. Cocaine abuse.  Sonographer:    Ross LudwigArthur Guy RDCS (AE) Referring Phys: Benay SpiceLINDSAY NICOLE [facility]  Sonographer Comments: Echo performed with patient supine and on artificial respirator and suboptimal parasternal window. Bandages covering subcostal area. IMPRESSIONS  1. Thickening of the aortic vavle and mitral valve was compared to prior transthoracic echoes and is unchanged. It was not present on TEE, and is there likely artifactual.  2. Left ventricular ejection fraction, by estimation, is 50 to 55%. The left ventricle has low normal function. The left ventricle demonstrates regional wall motion abnormalities (see scoring diagram/findings for description). There is mild left ventricular hypertrophy of the lateral segment. Left ventricular diastolic parameters are consistent with Grade II diastolic dysfunction (pseudonormalization). Elevated left ventricular end-diastolic pressure.  3. Right ventricular systolic function is normal. The right ventricular size is normal.   4. The mitral valve is normal in structure. Mild mitral valve regurgitation. No evidence of mitral stenosis.  5. The aortic valve has an indeterminant number of cusps. There is mild calcification of the aortic valve. There is mild thickening of the aortic valve. Aortic valve regurgitation is mild. No aortic stenosis is present.  6. Aortic dilatation noted. There is mild dilatation of the aortic root, measuring 36 mm.  7. The inferior vena cava is normal in size with greater than 50% respiratory variability, suggesting right atrial pressure of 3 mmHg. FINDINGS  Left Ventricle: Left ventricular ejection fraction, by estimation, is 50 to 55%. The left ventricle has low normal function. The left ventricle demonstrates regional wall motion abnormalities. Definity contrast agent was given IV to delineate the left ventricular endocardial borders. The left ventricular internal cavity size was normal in size. There is mild left ventricular hypertrophy of the lateral segment. Left ventricular diastolic parameters are consistent with Grade II diastolic dysfunction (pseudonormalization). Elevated left ventricular end-diastolic pressure.  LV Wall Scoring: The anterior septum is hypokinetic. The entire anterior wall, entire lateral wall, inferior septum, entire apex, and entire inferior wall are normal. Right Ventricle: The right ventricular size is normal. No increase in right ventricular wall thickness. Right ventricular systolic function is normal. Left Atrium: Left atrial size was normal in size. Right Atrium: Right atrial size was normal in size. Pericardium: There is no evidence of pericardial effusion. Mitral Valve: The mitral valve is normal in structure. Mild mitral annular calcification. Mild mitral valve regurgitation. No evidence of mitral valve stenosis. Tricuspid Valve: The tricuspid valve is normal in structure. Tricuspid valve regurgitation is trivial. No evidence of tricuspid stenosis. Aortic Valve: The aortic  valve has an indeterminant number of cusps. There is mild calcification of the aortic valve. There is mild thickening of the aortic valve. Aortic valve regurgitation is mild. No aortic stenosis is present. Aortic valve mean gradient measures 3.0 mmHg. Aortic valve peak gradient measures 5.8 mmHg. Aortic valve area, by VTI measures 3.49 cm. Pulmonic Valve:  The pulmonic valve was normal in structure. Pulmonic valve regurgitation is not visualized. No evidence of pulmonic stenosis. Aorta: Aortic dilatation noted. There is mild dilatation of the aortic root, measuring 36 mm. Venous: The inferior vena cava is normal in size with greater than 50% respiratory variability, suggesting right atrial pressure of 3 mmHg. IAS/Shunts: No atrial level shunt detected by color flow Doppler.  LEFT VENTRICLE PLAX 2D LVIDd:         5.10 cm      Diastology LVIDs:         3.40 cm      LV e' medial:    4.46 cm/s LV PW:         1.50 cm      LV E/e' medial:  23.1 LV IVS:        1.00 cm      LV e' lateral:   3.81 cm/s LVOT diam:     2.30 cm      LV E/e' lateral: 27.0 LV SV:         59 LV SV Index:   25 LVOT Area:     4.15 cm  LV Volumes (MOD) LV vol d, MOD A2C: 114.0 ml LV vol d, MOD A4C: 121.0 ml LV vol s, MOD A2C: 58.3 ml LV vol s, MOD A4C: 77.0 ml LV SV MOD A2C:     55.7 ml LV SV MOD A4C:     121.0 ml LV SV MOD BP:      50.9 ml RIGHT VENTRICLE RV Basal diam:  2.70 cm RV S prime:     6.31 cm/s TAPSE (M-mode): 0.9 cm LEFT ATRIUM           Index        RIGHT ATRIUM           Index LA diam:      3.80 cm 1.62 cm/m   RA Area:     12.10 cm LA Vol (A2C): 38.7 ml 16.46 ml/m  RA Volume:   22.50 ml  9.57 ml/m LA Vol (A4C): 57.6 ml 24.50 ml/m  AORTIC VALVE AV Area (Vmax):    3.45 cm AV Area (Vmean):   3.21 cm AV Area (VTI):     3.49 cm AV Vmax:           120.00 cm/s AV Vmean:          82.600 cm/s AV VTI:            0.168 m AV Peak Grad:      5.8 mmHg AV Mean Grad:      3.0 mmHg LVOT Vmax:         99.70 cm/s LVOT Vmean:        63.900 cm/s  LVOT VTI:          0.141 m LVOT/AV VTI ratio: 0.84  AORTA Ao Root diam: 3.60 cm MITRAL VALVE                TRICUSPID VALVE MV Area (PHT): 2.99 cm     TR Peak grad:   13.2 mmHg MV Decel Time: 254 msec     TR Vmax:        182.00 cm/s MV E velocity: 103.00 cm/s MV A velocity: 61.00 cm/s   SHUNTS MV E/A ratio:  1.69         Systemic VTI:  0.14 m  Systemic Diam: 2.30 cm Chilton Si MD Electronically signed by Chilton Si MD Signature Date/Time: 02/21/2022/11:05:18 AM    Final    DG Chest Port 1 View  Result Date: 02/21/2022 CLINICAL DATA:  Status post CABG and atrial clipping. EXAM: PORTABLE CHEST 1 VIEW COMPARISON:  One-view chest x-ray 02/20/2022 FINDINGS: Endotracheal tube is stable at the level clavicles. NG tube terminates in the stomach. Swan-Ganz catheter enters via right IJ approach in terminates in the right main pulmonary outflow tract. Left-sided chest tube and mediastinal drains are stable. A left pleural effusion has increased. Patchy airspace opacity in the left lung is similar the prior exam. No pneumothorax is present. Lung volumes are low. IMPRESSION: 1. Increasing left pleural effusion. 2. Stable patchy airspace opacity in the left lung, likely reflecting atelectasis. 3. Support apparatus is stable. 4. No pneumothorax. Electronically Signed   By: Marin Roberts M.D.   On: 02/21/2022 08:14   Medications:  sodium chloride 10 mL/hr at 02/22/22 0700   sodium chloride     sodium chloride 10 mL/hr at 02/22/22 0600   milrinone 0.125 mcg/kg/min (02/22/22 0700)    acetaminophen  1,000 mg Oral Q6H   Or   acetaminophen (TYLENOL) oral liquid 160 mg/5 mL  1,000 mg Per Tube Q6H   amiodarone  400 mg Oral BID   aspirin EC  325 mg Oral Daily   Or   aspirin  324 mg Per Tube Daily   bisacodyl  10 mg Oral Daily   Or   bisacodyl  10 mg Rectal Daily   Chlorhexidine Gluconate Cloth  6 each Topical Daily   furosemide  80 mg Intravenous BID   gabapentin  200  mg Oral BID   heparin injection (subcutaneous)  5,000 Units Subcutaneous Q8H   insulin aspart  0-24 Units Subcutaneous Q4H   lidocaine  2 patch Transdermal Q24H   pantoprazole (PROTONIX) IV  40 mg Intravenous Q24H   rosuvastatin  40 mg Oral Daily   sodium chloride flush  3 mL Intravenous Q12H    Assessment/Plan:  Hyperkalemia -   He certainly has multiple risk factors for hyperkalemia (AKI, mild CKD, K fluids, transfusions, poss hypoperfusion, mildly elevated CK) but the degree and timing are a bit out of proportion to expected.  Concern exists for atypical malignant hyperthermia (rec'd dantrolene) but less likely. Heparin has been implicated in hyperkalemia but it's through reduced aldo synthesis and not an immediate effect.  Nonetheless, K now WNL No need for RRT at this time - C/w med mgmt for hyperK, can certainly revisit RRT if hyperK again that is refractory to treatment NSTEMI with severe multivessel CAD s/p CABG x 3 - with post-operative cardiogenic shock/vasoplegia. Off levo and epi. On milrinone VDRF - per PCCM. Extubated. Lasxi 80mg  iv bid AKI on CKD stage IIIa - Baseline Cr 1.3 now with AKI in setting after CABG, brief arrest.   Cr down to 2.19, responding well to diuretics. ABLA - transfused to 8s; follow. Transfuse prn Cocaine abuse - will need counseling on cessation.    Nothing else to add from a nephrology perspective at this time. Diuresis at the direction of cardiology and CCM. Will sign off for now. Please call with any questions/concerns.  , MD [facility]

## 2022-02-22 NOTE — Progress Notes (Signed)
[address]       [place] [address]             [phone]                 3 Days Post-Op Procedure(s) (LRB): CORONARY ARTERY BYPASS GRAFTING (CABG) X THREE, USING ENDOSCOPICALLY HARVESTED RIGHT GREATER SAPHENOUS VEIN  AND LEFT ATRIAL APPENDAGE CLIPPING (N/A)   Events: Extubated Nausea this am _______________________________________________________________ Vitals: BP 115/71   Pulse 80   Temp 99 F (37.2 C)   Resp 19   Ht 6' (1.829 m)   Wt 110.7 kg   SpO2 94%   BMI 33.10 kg/m  Filed Weights   02/20/22 0500 02/21/22 0421 02/22/22 0500  Weight: 115.3 kg 114.4 kg 110.7 kg     - Neuro: alert NAD  - Cardiovascular: sinus  Drips: milr 0.125.   PAP: (14-41)/(1-30) 30/16 CVP:  [9 mmHg-13 mmHg] 11 mmHg CO:  [4.8 L/min-7.7 L/min] 5.5 L/min CI:  [2.1 L/min/m2-3.5 L/min/m2] 2.5 L/min/m2  - Pulm: EWOB   ABG    Component Value Date/Time   PHART 7.344 (L) 02/20/2022 0638   PCO2ART 36.2 02/20/2022 0638   PO2ART 134 (H) 02/20/2022 0638   HCO3 19.7 (L) 02/20/2022 0638   TCO2 21 (L) 02/20/2022 4481   ACIDBASEDEF 5.0 (H) 02/20/2022 0638   O2SAT 44.6 02/22/2022 0817    - Abd: ND, soft - Extremity: warm  .Intake/Output      12/29 0701 12/30 0700 12/30 0701 12/31 0700   I.V. (mL/kg) 2115.2 (19.1)    NG/GT 240    IV Piggyback 732.7    Total Intake(mL/kg) 3087.9 (27.9)    Urine (mL/kg/hr) 4155 (1.6)    Emesis/NG output 400    Drains 60    Chest Tube 420    Total Output 5035    Net -1947.1            _______________________________________________________________ Labs:    Latest Ref Rng & Units 02/22/2022    4:26 AM 02/21/2022    3:25 AM 02/20/2022    4:21 PM  CBC  WBC 4.0 - 10.5 K/uL 12.9  10.7  12.9   Hemoglobin 13.0 - 17.0 g/dL 8.0  8.2  8.7   Hematocrit 39.0 - 52.0 % 23.3  23.4  26.0   Platelets 150 - 400 K/uL 122  113  134       Latest Ref Rng & Units 02/22/2022    4:26 AM 02/21/2022    3:00 PM 02/21/2022   11:09 AM   CMP  Glucose 70 - 99 mg/dL 856     BUN 6 - 20 mg/dL 25     Creatinine 3.14 - 1.24 mg/dL 9.70     Sodium 263 - 785 mmol/L 140     Potassium 3.5 - 5.1 mmol/L 4.1  4.1  4.3   Chloride 98 - 111 mmol/L 107     CO2 22 - 32 mmol/L 23     Calcium 8.9 - 10.3 mg/dL 8.4     Total Protein 6.5 - 8.1 g/dL 5.8     Total Bilirubin 0.3 - 1.2 mg/dL 0.5     Alkaline Phos 38 - 126 U/L 48     AST 15 - 41 U/L 31     ALT 0 - 44 U/L 11       CXR: Left effusion, PV congestion  _______________________________________________________________  Assessment and Plan: POD 3 s/p CABG  Neuro: pain controlled. CV:  will remove A line.  CHF on board.  On milr.  Keeping wires.  Will transition amio to PO Pulm: IS, pulm hygiene.  Will keep drains given L effusion Renal: creat slowly trending down.  Gentle diuresis GI: treating nausea Heme: stable ID: [email or web address] Endo: SSI Dispo: continue ICU care   Drae Mitzel O Eusebio Blazejewski 02/22/2022 9:12 AM

## 2022-02-23 ENCOUNTER — Inpatient Hospital Stay (HOSPITAL_COMMUNITY): Payer: Medicaid Other

## 2022-02-23 LAB — COOXEMETRY PANEL
Carboxyhemoglobin: 1.4 % (ref 0.5–1.5)
Methemoglobin: 0.8 % (ref 0.0–1.5)
O2 Saturation: 44.8 %
Total hemoglobin: 10 g/dL — ABNORMAL LOW (ref 12.0–16.0)

## 2022-02-23 LAB — BASIC METABOLIC PANEL
Anion gap: 8 (ref 5–15)
Anion gap: 13 (ref 5–15)
BUN: 25 mg/dL — ABNORMAL HIGH (ref 6–20)
BUN: 24 mg/dL — ABNORMAL HIGH (ref 6–20)
CO2: 26 mmol/L (ref 22–32)
CO2: 25 mmol/L (ref 22–32)
Calcium: 8.4 mg/dL — ABNORMAL LOW (ref 8.9–10.3)
Calcium: 8.8 mg/dL — ABNORMAL LOW (ref 8.9–10.3)
Chloride: 103 mmol/L (ref 98–111)
Chloride: 101 mmol/L (ref 98–111)
Creatinine, Ser: 1.69 mg/dL — ABNORMAL HIGH (ref 0.61–1.24)
Creatinine, Ser: 1.65 mg/dL — ABNORMAL HIGH (ref 0.61–1.24)
GFR, Estimated: 49 mL/min — ABNORMAL LOW (ref >60)
GFR, Estimated: 51 mL/min — ABNORMAL LOW (ref >60)
Glucose, Bld: 111 mg/dL — ABNORMAL HIGH (ref 70–99)
Glucose, Bld: 106 mg/dL — ABNORMAL HIGH (ref 70–99)
Potassium: 3.2 mmol/L — ABNORMAL LOW (ref 3.5–5.1)
Potassium: 3.5 mmol/L (ref 3.5–5.1)
Sodium: 137 mmol/L (ref 135–145)
Sodium: 139 mmol/L (ref 135–145)

## 2022-02-23 LAB — CBC
HCT: 24.1 % — ABNORMAL LOW (ref 39.0–52.0)
Hemoglobin: 8.1 g/dL — ABNORMAL LOW (ref 13.0–17.0)
MCH: 30.8 pg (ref 26.0–34.0)
MCHC: 33.6 g/dL (ref 30.0–36.0)
MCV: 91.6 fL (ref 80.0–100.0)
Platelets: 177 10*3/uL (ref 150–400)
RBC: 2.63 MIL/uL — ABNORMAL LOW (ref 4.22–5.81)
RDW: 15 % (ref 11.5–15.5)
WBC: 12.8 10*3/uL — ABNORMAL HIGH (ref 4.0–10.5)
nRBC: 0.5 % — ABNORMAL HIGH (ref 0.0–0.2)

## 2022-02-23 LAB — GLUCOSE, CAPILLARY
Glucose-Capillary: 112 mg/dL — ABNORMAL HIGH (ref 70–99)
Glucose-Capillary: 132 mg/dL — ABNORMAL HIGH (ref 70–99)
Glucose-Capillary: 133 mg/dL — ABNORMAL HIGH (ref 70–99)
Glucose-Capillary: 51 mg/dL — ABNORMAL LOW (ref 70–99)
Glucose-Capillary: 55 mg/dL — ABNORMAL LOW (ref 70–99)
Glucose-Capillary: 109 mg/dL — ABNORMAL HIGH (ref 70–99)
Glucose-Capillary: 97 mg/dL (ref 70–99)

## 2022-02-23 MED ORDER — POTASSIUM CHLORIDE CRYS ER 20 MEQ PO TBCR
20.0000 meq | EXTENDED_RELEASE_TABLET | ORAL | Status: AC
  Administered 2022-02-23 – 2022-02-24 (×3): 20 meq via ORAL
  Filled 2022-02-23 (×3): qty 1

## 2022-02-23 MED ORDER — POTASSIUM CHLORIDE CRYS ER 20 MEQ PO TBCR
40.0000 meq | EXTENDED_RELEASE_TABLET | Freq: Once | ORAL | Status: AC
  Administered 2022-02-23: 40 meq via ORAL
  Filled 2022-02-23: qty 2

## 2022-02-23 MED ORDER — SPIRONOLACTONE 12.5 MG HALF TABLET
12.5000 mg | ORAL_TABLET | Freq: Every day | ORAL | Status: DC
  Administered 2022-02-23: 12.5 mg via ORAL
  Filled 2022-02-23: qty 1

## 2022-02-23 MED ORDER — POTASSIUM CHLORIDE CRYS ER 20 MEQ PO TBCR
20.0000 meq | EXTENDED_RELEASE_TABLET | ORAL | Status: AC
  Administered 2022-02-23 (×2): 20 meq via ORAL
  Filled 2022-02-23 (×2): qty 1

## 2022-02-23 NOTE — Progress Notes (Signed)
Repeat co ox of 44. Dr. Denese Killings at bedside assessing pt, made aware. Pt alert and oriented, CI remain >2, pt warm and dry, cap refill <3 sec, VSS. No new orders given.

## 2022-02-23 NOTE — Progress Notes (Signed)
Patient ID: Logan Crawford, male   DOB: 1976-01-18, 46 y.o.   MRN: [id number]   Advanced Heart Failure Rounding Note  PCP-Cardiologist: Debbe Odea, MD   Subjective:    Off epinephrine and NE.  Remains on milrinone 0.125.  BP stable.  Excellent diuresis again yesterday, weight down 6 lbs.  Creatinine 2.27 => 2.19 => 1.69.   NSR in 60s.    Still some dyspnea but improved.   SWAN #s CVP 15 PAP 32/19 CI 2.4 CO-OX ?45%  Echo (12/29): EF 50-55%, RV normal, mild MR, mild AI.    Objective:   Weight Range: 108.4 kg Body mass index is 32.41 kg/m.   Vital Signs:   Temp:  [98.6 F (37 C)-99.9 F (37.7 C)] 98.6 F (37 C) (12/31 0700) Pulse Rate:  [66-79] 69 (12/31 0700) Resp:  [10-29] 18 (12/31 0700) BP: (101-186)/(50-124) 130/74 (12/31 0700) SpO2:  [88 %-98 %] 98 % (12/31 0700) Arterial Line BP: (142)/(59) 142/59 (12/30 0900) Weight:  [108.4 kg] 108.4 kg (12/31 0500) Last BM Date : 02/18/22  Weight change: Filed Weights   02/21/22 0421 02/22/22 0500 02/23/22 0500  Weight: 114.4 kg 110.7 kg 108.4 kg    Intake/Output:   Intake/Output Summary (Last 24 hours) at 02/23/2022 0801 Last data filed at 02/23/2022 0700 Gross per 24 hour  Intake 248.67 ml  Output 3065 ml  Net -2816.33 ml      Physical Exam    General: mild tachypnea.  Neck: JVP 14 cm, no thyromegaly or thyroid nodule.  Lungs: Clear to auscultation bilaterally with normal respiratory effort. CV: Nondisplaced PMI.  Heart regular S1/S2, no S3/S4, no murmur.  1+ ankle edema.   Abdomen: Soft, nontender, no hepatosplenomegaly, no distention.  Skin: Intact without lesions or rashes.  Neurologic: Alert and oriented x 3.  Psych: Normal affect. Extremities: No clubbing or cyanosis.  HEENT: Normal.    Telemetry   NSR 60s (personally reviewed)   Labs    CBC Recent Labs    02/22/22 0426 02/23/22 0330  WBC 12.9* 12.8*  HGB 8.0* 8.1*  HCT 23.3* 24.1*  MCV 90.3 91.6  PLT 122* 177   Basic  Metabolic Panel Recent Labs    78/29/56 1621 02/20/22 2059 02/22/22 0426 02/23/22 0330  NA 137   < > 140 137  K 4.8   < > 4.1 3.2*  CL 104   < > 107 103  CO2 20*   < > 23 26  GLUCOSE 148*   < > 119* 111*  BUN 23*   < > 25* 25*  CREATININE 2.61*   < > 2.19* 1.69*  CALCIUM 8.2*   < > 8.4* 8.4*  MG 2.5*  --   --   --    < > = values in this interval not displayed.   Liver Function Tests Recent Labs    02/22/22 0426  AST 31  ALT 11  ALKPHOS 48  BILITOT 0.5  PROT 5.8*  ALBUMIN 3.1*   No results for input(s): "LIPASE", "AMYLASE" in the last 72 hours. Cardiac Enzymes Recent Labs    02/20/22 0954  CKTOTAL 1,249*    BNP: BNP (last 3 results) Recent Labs    06/27/21 1707 11/17/21 0043  BNP 18.9 23.6    ProBNP (last 3 results) No results for input(s): "PROBNP" in the last 8760 hours.   D-Dimer No results for input(s): "DDIMER" in the last 72 hours. Hemoglobin A1C No results for input(s): "HGBA1C" in the last 72 hours.  Fasting Lipid Panel No results for input(s): "CHOL", "HDL", "LDLCALC", "TRIG", "CHOLHDL", "LDLDIRECT" in the last 72 hours. Thyroid Function Tests No results for input(s): "TSH", "T4TOTAL", "T3FREE", "THYROIDAB" in the last 72 hours.  Invalid input(s): "FREET3"  Other results:   Imaging    No results found.   Medications:     Scheduled Medications:  acetaminophen  1,000 mg Oral Q6H   Or   acetaminophen (TYLENOL) oral liquid 160 mg/5 mL  1,000 mg Per Tube Q6H   amiodarone  400 mg Oral BID   aspirin EC  325 mg Oral Daily   Or   aspirin  324 mg Per Tube Daily   bisacodyl  10 mg Oral Daily   Or   bisacodyl  10 mg Rectal Daily   Chlorhexidine Gluconate Cloth  6 each Topical Daily   furosemide  80 mg Intravenous BID   gabapentin  200 mg Oral BID   heparin injection (subcutaneous)  5,000 Units Subcutaneous Q8H   insulin aspart  0-24 Units Subcutaneous Q4H   lidocaine  2 patch Transdermal Q24H   pantoprazole (PROTONIX) IV  40 mg  Intravenous Q24H   potassium chloride  20 mEq Oral Q4H   potassium chloride  40 mEq Oral Once   rosuvastatin  40 mg Oral Daily   sodium chloride flush  3 mL Intravenous Q12H   spironolactone  12.5 mg Oral Daily    Infusions:  sodium chloride Stopped (02/22/22 1505)   sodium chloride     sodium chloride 10 mL/hr at 02/22/22 0600   milrinone 0.125 mcg/kg/min (02/23/22 0700)    PRN Medications: sodium chloride, labetalol, morphine injection, ondansetron (ZOFRAN) IV, oxyCODONE, sodium chloride flush, traMADol   Assessment/Plan    1. Cardiogenic shock, post cardiotomy -S/p CABG 12/27. CHB and severe hyperkalemia prior to placement on cardiopulmonary bypass. Placed on CPB urgently.  -PreCABG echo EF 55% -Echo 12/29 with EF 50-55%, mild AI, mild MR.  -IABP out 12/29.  -CI 2.4 this am on milrinone 0.125, off NE and epinephrine. Creatinine trending down (1.69 today), continue milrinone for now for RV support at current dose while diuresing.  CI has been stable from [facility] though co-ox consistently low.  With down-trending creatinine and near-normal LV EF, suspect the co-ox is not an accurate representation here.    -CVP 15. Good diuresis yesterday with Lasix 80 mg IV bid, weight down.  Remains volume overloaded and mildly dyspneic, continue IV Lasix 80 mg bid with dose now.  -Add spironolactone 12.5 daily today. Replace K.    2. CAD with NSTEMI -Hx prior PCI/stent to LAD in 2021 and PCI/stent to LAD in 03/23 d/t in-stent restenosis -NSTEMI this admit. S/p CABG X 3 (LIMA to Diag, SVG to LAD, SVG to RCA) -Aspirin + statin -Admitted with NSTEMI, start Plavix when ok with TCTS.   3. AKI on CKD -Baseline Scr 1.1-1.3, 1.6 pre-op -Cr up to 2.5, starting to trend down. 1.69 this am -Support CO, continue milrinone 0.125 for now for RV.  -Watch with diuresis.   4. Severe Hyperkalemia -Etiology uncertain -Occurred prior to initation CPB. Received dantrolene. Some initial concern for malignant  hyperthermia.  -Resolved, K now now (replace).     5. CHB -Occurred prior to placement on CPB, in setting of hyperkalemia -Now in NSR in 60s, not pacing.    6. Polysubstance abuse -Tobacco and cocaine -UDS + for cocaine on admit   7. SDOH: -Hx noncompliance and substance abuse -Uninsured. Will need to engage TOC CSW/CM.  CRITICAL CARE Performed by: Marca Ancona  Total critical care time: 40 minutes  Critical care time was exclusive of separately billable procedures and treating other patients.  Critical care was necessary to treat or prevent imminent or life-threatening deterioration.  Critical care was time spent personally by me (independent of midlevel providers or residents) on the following activities: development of treatment plan with patient and/or surrogate as well as nursing, discussions with consultants, evaluation of patient's response to treatment, examination of patient, obtaining history from patient or surrogate, ordering and performing treatments and interventions, ordering and review of laboratory studies, ordering and review of radiographic studies, pulse oximetry and re-evaluation of patient's condition.  Marca Ancona, MD  8:01 AM

## 2022-02-23 NOTE — Progress Notes (Signed)
NAME:  Logan Crawford, MRN:  [id number], DOB:  14-Nov-1975, LOS: 6 ADMISSION DATE:  02/17/2022, CONSULTATION DATE:  12/27 REFERRING MD:  12/27, CHIEF COMPLAINT:  post-op CABG  History of Present Illness:  46 yom w/ hx as outlined below had known 2V CAD presented to ER 12/21 w/ CP c/w prior NSTEMI. Left Heart cath showed re-stenosis of LAD (felt 2/2 non-compliance w DAPT and cocaine abuse). Also found to have sig diseased RCA. Presents to the CVICU 12/27 s/p CABG X 3V w/ placement of IABP, had brief CHB intraoperative w/ potassium noted around 6.8 and pt placed on bypass urgently  PCCM asked to assist w/ post op care   Pertinent  Medical History  CAD . 11/2019 NSTEMI/PCI: LM min irregs, LAD 37m (2.75x22 Resolute Onyx DES)  HFpEF 60-65% HTN, HL, tobacco abuse  Polysubstance abuse.  Significant Hospital Events: Including procedures, antibiotic start and stop dates in addition to other pertinent events   12/21 admitted. Left heart cath: Progression of Severe Multivessel: - 2 potential culprit lesions: distal-mid RCA ulcerated 95% ruptured plaque (mid #2), distal mid LAD stent edge 99% with diffuse disease elsewhere RCA:-proximal #1 70%, followed by proximal #2 70%, mid #1 1 70% and mid #2 95%, ostial PDA 60% LAD: Long stented area with proximal concentric 70% are, jailed D2 85% Zio, distal stent focal 80% followed by segmental 50% then 99% at distal edge. LCx-small OM1 and OM 2 with bifurcating OM 3, AVG circumflex with small branches  12/26 ECHO:  1. Small area of distal septal/inferior basal hypokinesis . Left  ventricular ejection fraction, by estimation, is 55%. The left ventricle  has normal function. The left ventricle has no regional wall motion  abnormalities. There is mild left ventricular hypertrophy. Left ventricular diastolic parameters are consistent with  Grade I diastolic dysfunction (impaired relaxation).  Right ventricular systolic function is normal.  67/61 3 V CABG and  placement of IABP by Enter. Intra-op received 2 units PRBC. Consulted Nephro, 1uPRBC overnight 12/28 - SIMV/PS received 1 unit of blood 12/29 IABP removed and extubated. Potassium has corrected, renal failure is improving.   Interim History / Subjective:  Pain control has improved.  Objective   Blood pressure 126/64, pulse 69, temperature 97.8 F (36.6 C), temperature source Oral, resp. rate 11, height 6' (1.829 m), weight 108.4 kg, SpO2 97 %. PAP: (23-40)/(7-24) 27/7 CVP:  [6 mmHg-21 mmHg] 13 mmHg CO:  [5.2 L/min-6.2 L/min] 5.2 L/min CI:  [2.4 L/min/m2-2.8 L/min/m2] 2.4 L/min/m2      Intake/Output Summary (Last 24 hours) at 02/23/2022 1208 Last data filed at 02/23/2022 1200 Gross per 24 hour  Intake 387.61 ml  Output 2470 ml  Net -2082.39 ml    Filed Weights   02/21/22 0421 02/22/22 0500 02/23/22 0500  Weight: 114.4 kg 110.7 kg 108.4 kg   Physical Examination: General sitting in chair, appears more comfortable.  HEENT normocephalic atraumatic  Pulmonary: bronchial breath sounds at left base improving. No wheezing.  Cardiac: Regular rate and rhythm, HS normal with no rub Abdomen soft Extremities warm with brisk cap refill pulses palpable Neuro intact  Ancillary tests personally reviewed:  Creatinine 1.69 HB 8.1 PLT 177 Assessment & Plan:  NSTEMI w/ Severe multivessel CAD, now s/p 3 V CABG 12/27 w/ post-operative cardiogenic shock/vasoplegia w/ IABP - Continue milrinone to support RV today. Adequate CI and clinical perfusion.  Persistently low Co. oximetry likely inaccurate. -DC Swan-Ganz catheter -Track CVP to monitor RV function  Acute hypoxic respiratory failure due  to pulmonary edema and atelectasis -Continue diuresis diuresis -Multimodal pain control, add gabapentin and lidocaine patches - Incentive spirometry -Pull drains and start ambulating  CKD stage 2 with severe unexplained hyperkalemia intraoperatively. - Hyperkalemia resolved, cause unclear. Unlikely  MH per conversation with [facility] consultation.  - Follow creatinine, improving  Post-op anemia - Transfuse for HB <7.0  Secondary cardiac prevention H/o tobacco abuse and poly-substance abuse -Cessation support once appropriate -High dose statin - ASA now, Plavix at discharge.  - Appears to have fairly significant hypertension. - was on carvedilol and Imdur preop.  - Prediabetes with Hba1c of 5. 8. Carb modified diet.  Best Practice (right click and "Reselect all SmartList Selections" daily)   Diet/type: progressive diet DVT prophylaxis: heparin GI prophylaxis: H2B Lines: Central line Foley:  Yes, and it is still needed Code Status:  full code Last date of multidisciplinary goals of care discussion [pending]   Lynnell Catalan, MD [facility] ICU Physician [facility] [place] Critical [facility]  Pager: [phone] Mobile: [phone] After hours: [phone].

## 2022-02-23 NOTE — Progress Notes (Signed)
[address]       [place] [address]             [phone]                 4 Days Post-Op Procedure(s) (LRB): CORONARY ARTERY BYPASS GRAFTING (CABG) X THREE, USING ENDOSCOPICALLY HARVESTED RIGHT GREATER SAPHENOUS VEIN  AND LEFT ATRIAL APPENDAGE CLIPPING (N/A)   Events: No events _______________________________________________________________ Vitals: BP 116/73   Pulse 65   Temp 98.6 F (37 C)   Resp 15   Ht 6' (1.829 m)   Wt 108.4 kg   SpO2 97%   BMI 32.41 kg/m  Filed Weights   02/21/22 0421 02/22/22 0500 02/23/22 0500  Weight: 114.4 kg 110.7 kg 108.4 kg     - Neuro: alert NAD  - Cardiovascular: sinus  Drips: milr 0.125.   PAP: (23-40)/(9-27) 30/15 CVP:  [6 mmHg-21 mmHg] 14 mmHg CO:  [5.2 L/min-6.2 L/min] 5.2 L/min CI:  [2.4 L/min/m2-2.8 L/min/m2] 2.4 L/min/m2  - Pulm: EWOB   ABG    Component Value Date/Time   PHART 7.344 (L) 02/20/2022 0638   PCO2ART 36.2 02/20/2022 0638   PO2ART 134 (H) 02/20/2022 0638   HCO3 19.7 (L) 02/20/2022 0638   TCO2 21 (L) 02/20/2022 0093   ACIDBASEDEF 5.0 (H) 02/20/2022 0638   O2SAT 44.8 02/23/2022 0330    - Abd: ND, soft - Extremity: warm  .Intake/Output      12/30 0701 12/31 0700 12/31 0701 01/01 0700   P.O.  240   I.V. (mL/kg) 279.5 (2.6) 8.7 (0.1)   NG/GT     IV Piggyback     Total Intake(mL/kg) 279.5 (2.6) 248.7 (2.3)   Urine (mL/kg/hr) 2665 (1)    Emesis/NG output     Drains 20    Chest Tube 380 50   Total Output 3065 50   Net -2785.6 +198.7           _______________________________________________________________ Labs:    Latest Ref Rng & Units 02/23/2022    3:30 AM 02/22/2022    4:26 AM 02/21/2022    3:25 AM  CBC  WBC 4.0 - 10.5 K/uL 12.8  12.9  10.7   Hemoglobin 13.0 - 17.0 g/dL 8.1  8.0  8.2   Hematocrit 39.0 - 52.0 % 24.1  23.3  23.4   Platelets 150 - 400 K/uL 177  122  113       Latest Ref Rng & Units 02/23/2022    3:30 AM 02/22/2022    4:26 AM 02/21/2022     3:00 PM  CMP  Glucose 70 - 99 mg/dL 818  299    BUN 6 - 20 mg/dL 25  25    Creatinine 3.71 - 1.24 mg/dL 6.96  7.89    Sodium 381 - 145 mmol/L 137  140    Potassium 3.5 - 5.1 mmol/L 3.2  4.1  4.1   Chloride 98 - 111 mmol/L 103  107    CO2 22 - 32 mmol/L 26  23    Calcium 8.9 - 10.3 mg/dL 8.4  8.4    Total Protein 6.5 - 8.1 g/dL  5.8    Total Bilirubin 0.3 - 1.2 mg/dL  0.5    Alkaline Phos 38 - 126 U/L  48    AST 15 - 41 U/L  31    ALT 0 - 44 U/L  11      CXR: Left effusion, PV congestion  _______________________________________________________________  Assessment and Plan: POD 4 s/p CABG  Neuro: pain controlled. CV: will remove wires, and swan.  Will keep milr.   Pulm: IS, pulm hygiene.  Will keep drains given L effusion Renal: creat slowly trending down.  Gentle diuresis GI: treating nausea Heme: stable ID: afebrile Endo: SSI Dispo: continue ICU care   Corliss Skains 02/23/2022 9:59 AM

## 2022-02-24 ENCOUNTER — Inpatient Hospital Stay (HOSPITAL_COMMUNITY): Payer: Medicaid Other

## 2022-02-24 ENCOUNTER — Other Ambulatory Visit (HOSPITAL_COMMUNITY): Payer: Self-pay

## 2022-02-24 DIAGNOSIS — R570 Cardiogenic shock: Secondary | ICD-10-CM

## 2022-02-24 DIAGNOSIS — I50810 Right heart failure, unspecified: Secondary | ICD-10-CM

## 2022-02-24 DIAGNOSIS — I214 Non-ST elevation (NSTEMI) myocardial infarction: Secondary | ICD-10-CM

## 2022-02-24 LAB — BASIC METABOLIC PANEL
Anion gap: 5 (ref 5–15)
BUN: 27 mg/dL — ABNORMAL HIGH (ref 6–20)
CO2: 27 mmol/L (ref 22–32)
Calcium: 8.5 mg/dL — ABNORMAL LOW (ref 8.9–10.3)
Chloride: 105 mmol/L (ref 98–111)
Creatinine, Ser: 1.64 mg/dL — ABNORMAL HIGH (ref 0.61–1.24)
GFR, Estimated: 51 mL/min — ABNORMAL LOW (ref >60)
Glucose, Bld: 102 mg/dL — ABNORMAL HIGH (ref 70–99)
Potassium: 3.7 mmol/L (ref 3.5–5.1)
Sodium: 137 mmol/L (ref 135–145)

## 2022-02-24 LAB — CBC
HCT: 24.3 % — ABNORMAL LOW (ref 39.0–52.0)
Hemoglobin: 8.2 g/dL — ABNORMAL LOW (ref 13.0–17.0)
MCH: 31.2 pg (ref 26.0–34.0)
MCHC: 33.7 g/dL (ref 30.0–36.0)
MCV: 92.4 fL (ref 80.0–100.0)
Platelets: 232 10*3/uL (ref 150–400)
RBC: 2.63 MIL/uL — ABNORMAL LOW (ref 4.22–5.81)
RDW: 14.7 % (ref 11.5–15.5)
WBC: 14.3 10*3/uL — ABNORMAL HIGH (ref 4.0–10.5)
nRBC: 0.3 % — ABNORMAL HIGH (ref 0.0–0.2)

## 2022-02-24 LAB — RESPIRATORY PANEL BY PCR

## 2022-02-24 LAB — GLUCOSE, CAPILLARY
Glucose-Capillary: 103 mg/dL — ABNORMAL HIGH (ref 70–99)
Glucose-Capillary: 107 mg/dL — ABNORMAL HIGH (ref 70–99)
Glucose-Capillary: 113 mg/dL — ABNORMAL HIGH (ref 70–99)
Glucose-Capillary: 116 mg/dL — ABNORMAL HIGH (ref 70–99)
Glucose-Capillary: 120 mg/dL — ABNORMAL HIGH (ref 70–99)
Glucose-Capillary: 91 mg/dL (ref 70–99)
Glucose-Capillary: 98 mg/dL (ref 70–99)

## 2022-02-24 LAB — MAGNESIUM: Magnesium: 2 mg/dL (ref 1.7–2.4)

## 2022-02-24 MED ORDER — OXYCODONE HCL 5 MG PO TABS: 5.0000 mg | ORAL_TABLET | ORAL | Status: DC | PRN

## 2022-02-24 MED ORDER — DAPAGLIFLOZIN PROPANEDIOL 10 MG PO TABS
10.0000 mg | ORAL_TABLET | Freq: Every day | ORAL | Status: DC
  Administered 2022-02-24 – 2022-03-01 (×6): 10 mg via ORAL
  Filled 2022-02-24 (×6): qty 1

## 2022-02-24 MED ORDER — PANTOPRAZOLE SODIUM 40 MG PO TBEC
40.0000 mg | DELAYED_RELEASE_TABLET | Freq: Every day | ORAL | Status: DC
  Administered 2022-02-24 – 2022-03-01 (×6): 40 mg via ORAL
  Filled 2022-02-24 (×6): qty 1

## 2022-02-24 MED ORDER — POTASSIUM CHLORIDE CRYS ER 20 MEQ PO TBCR
20.0000 meq | EXTENDED_RELEASE_TABLET | ORAL | Status: AC
  Administered 2022-02-24 (×3): 20 meq via ORAL
  Filled 2022-02-24 (×3): qty 1

## 2022-02-24 MED ORDER — SODIUM CHLORIDE 0.9 % IV SOLN: INTRAVENOUS | Status: DC | PRN

## 2022-02-24 MED ORDER — CEFAZOLIN SODIUM-DEXTROSE 2-4 GM/100ML-% IV SOLN
2.0000 g | Freq: Three times a day (TID) | INTRAVENOUS | Status: DC
  Administered 2022-02-24 – 2022-02-27 (×10): 2 g via INTRAVENOUS
  Filled 2022-02-24 (×10): qty 100

## 2022-02-24 MED ORDER — SPIRONOLACTONE 25 MG PO TABS
25.0000 mg | ORAL_TABLET | Freq: Every day | ORAL | Status: DC
  Administered 2022-02-24 – 2022-03-01 (×6): 25 mg via ORAL
  Filled 2022-02-24 (×6): qty 1

## 2022-02-24 NOTE — Progress Notes (Signed)
NAME:  Logan Crawford, MRN:  [id number], DOB:  Aug 11, 1975, LOS: 7 ADMISSION DATE:  02/17/2022, CONSULTATION DATE:  12/27 REFERRING MD:  12/27, CHIEF COMPLAINT:  post-op CABG  History of Present Illness:  71 yom w/ hx as outlined below had known 2V CAD presented to ER 12/21 w/ CP c/w prior NSTEMI. Left Heart cath showed re-stenosis of LAD (felt 2/2 non-compliance w DAPT and cocaine abuse). Also found to have sig diseased RCA. Presents to the CVICU 12/27 s/p CABG X 3V w/ placement of IABP, had brief CHB intraoperative w/ potassium noted around 6.8 and pt placed on bypass urgently  PCCM asked to assist w/ post op care   Pertinent  Medical History  CAD . 11/2019 NSTEMI/PCI: LM min irregs, LAD 40m (2.75x22 Resolute Onyx DES)  HFpEF 60-65% HTN, HL, tobacco abuse  Polysubstance abuse.  Significant Hospital Events: Including procedures, antibiotic start and stop dates in addition to other pertinent events   12/21 admitted. Left heart cath: Progression of Severe Multivessel: - 2 potential culprit lesions: distal-mid RCA ulcerated 95% ruptured plaque (mid #2), distal mid LAD stent edge 99% with diffuse disease elsewhere RCA:-proximal #1 70%, followed by proximal #2 70%, mid #1 1 70% and mid #2 95%, ostial PDA 60% LAD: Long stented area with proximal concentric 70% are, jailed D2 85% Zio, distal stent focal 80% followed by segmental 50% then 99% at distal edge. LCx-small OM1 and OM 2 with bifurcating OM 3, AVG circumflex with small branches  12/26 ECHO:  1. Small area of distal septal/inferior basal hypokinesis . Left  ventricular ejection fraction, by estimation, is 55%. The left ventricle  has normal function. The left ventricle has no regional wall motion  abnormalities. There is mild left ventricular hypertrophy. Left ventricular diastolic parameters are consistent with  Grade I diastolic dysfunction (impaired relaxation).  Right ventricular systolic function is normal.  12/27 3 V CABG and  placement of IABP by Enter. Intra-op received 2 units PRBC. Consulted Nephro, 1uPRBC overnight 12/28 - SIMV/PS received 1 unit of blood 12/29 IABP removed and extubated. Potassium has corrected, renal failure is improving.   Interim History / Subjective:  Pain control has improved.  Feels edema and breathing are also better. Ambulated yesterday.   Objective   Blood pressure (!) 127/97, pulse 75, temperature 97.9 F (36.6 C), temperature source Axillary, resp. rate 19, height 6' (1.829 m), weight 105.2 kg, SpO2 100 %. PAP: (27)/(7) 27/7 CVP:  [7 mmHg-26 mmHg] 11 mmHg      Intake/Output Summary (Last 24 hours) at 02/24/2022 0913 Last data filed at 02/24/2022 0900 Gross per 24 hour  Intake 583.36 ml  Output 3335 ml  Net -2751.64 ml    Filed Weights   02/22/22 0500 02/23/22 0500 02/24/22 0500  Weight: 110.7 kg 108.4 kg 105.2 kg   Physical Examination: General sitting in chair, appears more comfortable.  HEENT normocephalic atraumatic  Pulmonary: bronchial breath sounds at left base improving. No wheezing.  Cardiac: Regular rate and rhythm, HS normal with no rub. Small amount of drainage at top  of incision.  Abdomen soft Extremities edema much improved.  Neuro intact  Ancillary tests personally reviewed:  Creatinine 1.64  HB 8.2 PLT 232 CXR clearing left effusion.   Assessment & Plan:  NSTEMI w/ Severe multivessel CAD, now s/p 3 V CABG 12/27 w/ post-operative cardiogenic shock/vasoplegia w/ IABP - Continue milrinone to support RV today. Adequate CI and clinical perfusion.  Persistently low Co. oximetry likely inaccurate. -Track CVP to  monitor RV function - Continue milrinone and diuresis for one more day.   Acute hypoxic respiratory failure due to pulmonary edema and atelectasis -Continue diuresis for one more day  - Continue Multimodal pain control, add gabapentin and lidocaine patches - Incentive spirometry - Encourage ambulation.   Upper sternal drainage with  leukocytosis - Start cefazolin.  -Consider CT chest if drainage continues, WBC continues to increase.   CKD stage 2 with severe unexplained hyperkalemia intraoperatively. - Hyperkalemia resolved, cause unclear. Unlikely MH per conversation with [facility] Society consultation.  - Follow creatinine, improving  Post-op anemia - Transfuse for HB <7.0  Secondary cardiac prevention H/o tobacco abuse and poly-substance abuse -Cessation support once appropriate -High dose statin - ASA now, Plavix at discharge.  - Appears to have fairly significant hypertension. - was on carvedilol and Imdur preop.  - Prediabetes with Hba1c of 5. 8. Carb modified diet.  Best Practice (right click and "Reselect all SmartList Selections" daily)   Diet/type: progressive diet DVT prophylaxis: heparin GI prophylaxis: H2B Lines: Central line Foley:  Yes, and it is still needed Code Status:  full code Last date of multidisciplinary goals of care discussion [pending]   Kipp Brood, MD [facility] ICU Physician [place]  Pager: [phone] Mobile: [phone] After hours: [phone].

## 2022-02-24 NOTE — Progress Notes (Signed)
[address]       [place] [address]             [phone]                 5 Days Post-Op Procedure(s) (LRB): CORONARY ARTERY BYPASS GRAFTING (CABG) X THREE, USING ENDOSCOPICALLY HARVESTED RIGHT GREATER SAPHENOUS VEIN  AND LEFT ATRIAL APPENDAGE CLIPPING (N/A)   Events: Coughing overnight. Developed some drainage from the top part of the sternal incision _______________________________________________________________ Vitals: BP (!) 127/97   Pulse 75   Temp 97.9 F (36.6 C) (Axillary)   Resp 19   Ht 6' (1.829 m)   Wt 105.2 kg   SpO2 100%   BMI 31.45 kg/m  Filed Weights   02/22/22 0500 02/23/22 0500 02/24/22 0500  Weight: 110.7 kg 108.4 kg 105.2 kg     - Neuro: alert NAD  - Cardiovascular: sinus  Drips: milr 0.125.   PAP: (27-30)/(7-15) 27/7 CVP:  [7 mmHg-26 mmHg] 11 mmHg  - Pulm: EWOB - Chest: serosanguinous drainage on dressing and expressed.  No redness  ABG    Component Value Date/Time   PHART 7.344 (L) 02/20/2022 0638   PCO2ART 36.2 02/20/2022 0638   PO2ART 134 (H) 02/20/2022 0638   HCO3 19.7 (L) 02/20/2022 0638   TCO2 21 (L) 02/20/2022 0638   ACIDBASEDEF 5.0 (H) 02/20/2022 0638   O2SAT 44.8 02/23/2022 0330    - Abd: ND, soft - Extremity: warm  .Intake/Output      12/31 0701 01/01 0700 01/01 0701 01/02 0700   P.O. 720    I.V. (mL/kg) 103.4 (1) 4.3 (0)   Total Intake(mL/kg) 823.4 (7.8) 4.3 (0)   Urine (mL/kg/hr) 2650 (1) 200 (1)   Drains 35    Chest Tube 150    Total Output 2835 200   Net -2011.6 -195.7           _______________________________________________________________ Labs:    Latest Ref Rng & Units 02/24/2022    4:06 AM 02/23/2022    3:30 AM 02/22/2022    4:26 AM  CBC  WBC 4.0 - 10.5 K/uL 14.3  12.8  12.9   Hemoglobin 13.0 - 17.0 g/dL 8.2  8.1  8.0   Hematocrit 39.0 - 52.0 % 24.3  24.1  23.3   Platelets 150 - 400 K/uL 232  177  122       Latest Ref Rng & Units 02/24/2022    4:06 AM 02/23/2022     4:35 PM 02/23/2022    3:30 AM  CMP  Glucose 70 - 99 mg/dL 102  106  111   BUN 6 - 20 mg/dL 27  24  25    Creatinine 0.61 - 1.24 mg/dL 1.64  1.65  1.69   Sodium 135 - 145 mmol/L 137  139  137   Potassium 3.5 - 5.1 mmol/L 3.7  3.5  3.2   Chloride 98 - 111 mmol/L 105  101  103   CO2 22 - 32 mmol/L 27  25  26    Calcium 8.9 - 10.3 mg/dL 8.5  8.8  8.4     CXR: -  _______________________________________________________________  Assessment and Plan: POD 5 s/p CABG  Neuro: pain controlled. CV: will remove wires, and swan.  Will keep milr.   Pulm: IS, pulm hygiene.   Renal: creat slowly trending down.  Gentle diuresis GI: treating nausea Heme: stable ID: leukocytosis, and sternal drainage.  Adding ancef.  If continued drainage, will  obtain CT chest Endo: SSI Dispo: continue ICU care   Lajuana Matte 02/24/2022 8:55 AM

## 2022-02-24 NOTE — Progress Notes (Signed)
[place] Progress Note Patient Name: Logan Crawford DOB: 1975-12-11 MRN: [id number]   Date of Service  02/24/2022  HPI/Events of Note  RN asked [place] to place an order for resp panel since patient has a cough with sputum which is new. No distress on camera, patient says he feels better with coughing but does have expectorate. Could just be post op as well. I am also told that patient has some drainage from the sternal incision when he coughs. This was relayed to surgery and they have asked it be cleaned and continue current care  eICU Interventions  Resp panel ordered      Intervention Category Major Interventions: Respiratory failure - evaluation and management  Margaretmary Lombard 02/24/2022, 4:51 AM

## 2022-02-24 NOTE — Progress Notes (Signed)
Patient ID: Logan Crawford, male   DOB: 04/14/1975, 47 y.o.   MRN: [id number]   Advanced Heart Failure Rounding Note  PCP-Cardiologist: Kate Sable, MD   Subjective:    Off epinephrine and NE.  Remains on milrinone 0.125.  BP stable.  Good diuresis on IV Lasix, weight down 7 lbs.  Creatinine 2.27 => 2.19 => 1.69 => 1.64. CVP 10-11.  Not measuring Co-ox as it has not seemed to be accurate.   NSR in 60s.    Breathing better and not coughing as much.  Respiratory panel negative.   Echo (12/29): EF 50-55%, RV normal, mild MR, mild AI.    Objective:   Weight Range: 105.2 kg Body mass index is 31.45 kg/m.   Vital Signs:   Temp:  [97.8 F (36.6 C)-99.7 F (37.6 C)] 99.1 F (37.3 C) (01/01 0500) Pulse Rate:  [65-78] 74 (01/01 0700) Resp:  [9-27] 21 (01/01 0700) BP: (109-172)/(61-123) 150/73 (01/01 0700) SpO2:  [88 %-100 %] 94 % (01/01 0700) Weight:  [105.2 kg] 105.2 kg (01/01 0500) Last BM Date : 02/18/22  Weight change: Filed Weights   02/22/22 0500 02/23/22 0500 02/24/22 0500  Weight: 110.7 kg 108.4 kg 105.2 kg    Intake/Output:   Intake/Output Summary (Last 24 hours) at 02/24/2022 0748 Last data filed at 02/24/2022 0700 Gross per 24 hour  Intake 823.37 ml  Output 2835 ml  Net -2011.63 ml      Physical Exam    General: NAD Neck: JVP 12 cm, no thyromegaly or thyroid nodule.  Lungs: Decreased at bases.  CV: Nondisplaced PMI.  Heart regular S1/S2, no S3/S4, no murmur.  No peripheral edema.  Abdomen: Soft, nontender, no hepatosplenomegaly, no distention.  Skin: Intact without lesions or rashes.  Neurologic: Alert and oriented x 3.  Psych: Normal affect. Extremities: No clubbing or cyanosis.  HEENT: Normal.    Telemetry   NSR 60s (personally reviewed)   Labs    CBC Recent Labs    02/23/22 0330 02/24/22 0406  WBC 12.8* 14.3*  HGB 8.1* 8.2*  HCT 24.1* 24.3*  MCV 91.6 92.4  PLT 177 166   Basic Metabolic Panel Recent Labs    02/23/22 1635  02/24/22 0406  NA 139 137  K 3.5 3.7  CL 101 105  CO2 25 27  GLUCOSE 106* 102*  BUN 24* 27*  CREATININE 1.65* 1.64*  CALCIUM 8.8* 8.5*  MG  --  2.0   Liver Function Tests Recent Labs    02/22/22 0426  AST 31  ALT 11  ALKPHOS 48  BILITOT 0.5  PROT 5.8*  ALBUMIN 3.1*   No results for input(s): "LIPASE", "AMYLASE" in the last 72 hours. Cardiac Enzymes No results for input(s): "CKTOTAL", "CKMB", "CKMBINDEX", "TROPONINI" in the last 72 hours.   BNP: BNP (last 3 results) Recent Labs    06/27/21 1707 11/17/21 0043  BNP 18.9 23.6    ProBNP (last 3 results) No results for input(s): "PROBNP" in the last 8760 hours.   D-Dimer No results for input(s): "DDIMER" in the last 72 hours. Hemoglobin A1C No results for input(s): "HGBA1C" in the last 72 hours. Fasting Lipid Panel No results for input(s): "CHOL", "HDL", "LDLCALC", "TRIG", "CHOLHDL", "LDLDIRECT" in the last 72 hours. Thyroid Function Tests No results for input(s): "TSH", "T4TOTAL", "T3FREE", "THYROIDAB" in the last 72 hours.  Invalid input(s): "FREET3"  Other results:   Imaging    DG CHEST PORT 1 VIEW  Result Date: 02/23/2022 CLINICAL DATA:  Recent cardiac  surgery EXAM: PORTABLE CHEST 1 VIEW COMPARISON:  Previous studies including the examination of 02/21/2022 FINDINGS: Transverse diameter of heart is increased. Central pulmonary vessels are prominent. Increased interstitial and alveolar markings are seen in parahilar regions and lower lung fields, more so on the left side. There is blunting of both lateral CP angles. There is no pneumothorax. Metallic sutures are seen in the sternum. There is a metallic clamp in the region of left atrial appendage. There is interval removal of endotracheal tube, enteric tube and Swan-Ganz catheter. There is interval removal of left chest tube. There is catheter in right IJ with its tip in superior vena cava. There is contrast in the lumen of fundus of the stomach. IMPRESSION:  Cardiomegaly. There is prominence of central pulmonary vessels and increased interstitial and alveolar markings in both lungs, more so on the left side suggesting CHF with asymmetric pulmonary edema. Possibility of underlying pneumonia is not excluded. Support devices as described above. Electronically Signed   By: Ernie Avena M.D.   On: 02/23/2022 22:00     Medications:     Scheduled Medications:  acetaminophen  1,000 mg Oral Q6H   Or   acetaminophen (TYLENOL) oral liquid 160 mg/5 mL  1,000 mg Per Tube Q6H   amiodarone  400 mg Oral BID   aspirin EC  325 mg Oral Daily   Or   aspirin  324 mg Per Tube Daily   bisacodyl  10 mg Oral Daily   Or   bisacodyl  10 mg Rectal Daily   Chlorhexidine Gluconate Cloth  6 each Topical Daily   dapagliflozin propanediol  10 mg Oral Daily   furosemide  80 mg Intravenous BID   gabapentin  200 mg Oral BID   heparin injection (subcutaneous)  5,000 Units Subcutaneous Q8H   insulin aspart  0-24 Units Subcutaneous Q4H   lidocaine  2 patch Transdermal Q24H   pantoprazole  40 mg Oral Daily   potassium chloride  20 mEq Oral Q4H   rosuvastatin  40 mg Oral Daily   sodium chloride flush  3 mL Intravenous Q12H   spironolactone  12.5 mg Oral Daily    Infusions:  sodium chloride Stopped (02/22/22 1505)   sodium chloride     sodium chloride 10 mL/hr at 02/22/22 0600   milrinone 0.125 mcg/kg/min (02/24/22 0700)    PRN Medications: sodium chloride, labetalol, morphine injection, ondansetron (ZOFRAN) IV, oxyCODONE, sodium chloride flush, traMADol   Assessment/Plan    1. Cardiogenic shock, post cardiotomy -S/p CABG 12/27. CHB and severe hyperkalemia prior to placement on cardiopulmonary bypass. Placed on CPB urgently.  -PreCABG echo EF 55% -Echo 12/29 with EF 50-55%, mild AI, mild MR.  -IABP out 12/29.  -CI 2.4 this am on milrinone 0.125, off NE and epinephrine. Creatinine trending down (1.65 today) though BUN a little higher, continue milrinone  for now for RV support at current dose while diuresing.  CI was been stable from [place] though co-ox consistently low.  With down-trending creatinine and near-normal LV EF, suspect the co-ox has not been an accurate representation here and no longer checking.    -CVP coming down, 10-11 today. Good diuresis yesterday with Lasix 80 mg IV bid, weight down.  Remains volume overloaded with weight still above pre-op but improving, continue IV Lasix 80 mg bid for 1 more day.  -Can increase spironolactone to 25 mg daily and replace K.  -Add Farxiga 10 mg daily.     2. CAD with NSTEMI -Hx prior PCI/stent  to LAD in 2021 and PCI/stent to LAD in 03/23 d/t in-stent restenosis -NSTEMI this admit. S/p CABG X 3 (LIMA to Diag, SVG to LAD, SVG to RCA) -Aspirin + statin -Admitted with NSTEMI, start Plavix when ok with TCTS.   3. AKI on CKD -Baseline Scr 1.1-1.3, 1.6 pre-op -Cr up to 2.5, now trending down. 1.64 this am though BUN mildly higher.  -Support CO, continue milrinone 0.125 for now for RV.  -Watch with diuresis.   4. Severe Hyperkalemia -Etiology uncertain -Occurred prior to initation CPB. Received dantrolene. Some initial concern for malignant hyperthermia.  -Resolved, K now now (replace).     5. CHB -Occurred prior to placement on CPB, in setting of hyperkalemia -Now in NSR in 60s, pacing wires out.  No further bradycardia noted.    6. Polysubstance abuse -Tobacco and cocaine -UDS + for cocaine on admit   7. SDOH: -Hx noncompliance and substance abuse -Uninsured. Will need to engage TOC CSW/CM.    CRITICAL CARE Performed by: Loralie Champagne  Total critical care time: 35 minutes  Critical care time was exclusive of separately billable procedures and treating other patients.  Critical care was necessary to treat or prevent imminent or life-threatening deterioration.  Critical care was time spent personally by me (independent of midlevel providers or residents) on the following  activities: development of treatment plan with patient and/or surrogate as well as nursing, discussions with consultants, evaluation of patient's response to treatment, examination of patient, obtaining history from patient or surrogate, ordering and performing treatments and interventions, ordering and review of laboratory studies, ordering and review of radiographic studies, pulse oximetry and re-evaluation of patient's condition.  Loralie Champagne, MD  7:48 AM

## 2022-02-25 ENCOUNTER — Other Ambulatory Visit: Payer: Self-pay

## 2022-02-25 DIAGNOSIS — I5032 Chronic diastolic (congestive) heart failure: Secondary | ICD-10-CM

## 2022-02-25 LAB — GLUCOSE, CAPILLARY
Glucose-Capillary: 105 mg/dL — ABNORMAL HIGH (ref 70–99)
Glucose-Capillary: 114 mg/dL — ABNORMAL HIGH (ref 70–99)
Glucose-Capillary: 126 mg/dL — ABNORMAL HIGH (ref 70–99)
Glucose-Capillary: 64 mg/dL — ABNORMAL LOW (ref 70–99)
Glucose-Capillary: 71 mg/dL (ref 70–99)
Glucose-Capillary: 92 mg/dL (ref 70–99)

## 2022-02-25 LAB — BASIC METABOLIC PANEL
Anion gap: 14 (ref 5–15)
BUN: 21 mg/dL — ABNORMAL HIGH (ref 6–20)
CO2: 27 mmol/L (ref 22–32)
Calcium: 8.9 mg/dL (ref 8.9–10.3)
Chloride: 99 mmol/L (ref 98–111)
Creatinine, Ser: 1.51 mg/dL — ABNORMAL HIGH (ref 0.61–1.24)
GFR, Estimated: 57 mL/min — ABNORMAL LOW (ref >60)
Glucose, Bld: 90 mg/dL (ref 70–99)
Potassium: 3.3 mmol/L — ABNORMAL LOW (ref 3.5–5.1)
Sodium: 140 mmol/L (ref 135–145)

## 2022-02-25 LAB — CBC
HCT: 25.8 % — ABNORMAL LOW (ref 39.0–52.0)
Hemoglobin: 8.6 g/dL — ABNORMAL LOW (ref 13.0–17.0)
MCH: 30.7 pg (ref 26.0–34.0)
MCHC: 33.3 g/dL (ref 30.0–36.0)
MCV: 92.1 fL (ref 80.0–100.0)
Platelets: 292 10*3/uL (ref 150–400)
RBC: 2.8 MIL/uL — ABNORMAL LOW (ref 4.22–5.81)
RDW: 14.8 % (ref 11.5–15.5)
WBC: 12.9 10*3/uL — ABNORMAL HIGH (ref 4.0–10.5)
nRBC: 0.9 % — ABNORMAL HIGH (ref 0.0–0.2)

## 2022-02-25 MED ORDER — CLOPIDOGREL BISULFATE 75 MG PO TABS
75.0000 mg | ORAL_TABLET | Freq: Every day | ORAL | Status: DC
  Administered 2022-02-25 – 2022-03-01 (×5): 75 mg via ORAL
  Filled 2022-02-25 (×5): qty 1

## 2022-02-25 MED ORDER — POTASSIUM CHLORIDE CRYS ER 20 MEQ PO TBCR
40.0000 meq | EXTENDED_RELEASE_TABLET | Freq: Once | ORAL | Status: AC
  Administered 2022-02-25: 40 meq via ORAL
  Filled 2022-02-25: qty 2

## 2022-02-25 MED ORDER — POTASSIUM CHLORIDE CRYS ER 20 MEQ PO TBCR
40.0000 meq | EXTENDED_RELEASE_TABLET | ORAL | Status: AC
  Administered 2022-02-25 (×2): 40 meq via ORAL
  Filled 2022-02-25 (×2): qty 2

## 2022-02-25 MED ORDER — DOCUSATE SODIUM 100 MG PO CAPS
100.0000 mg | ORAL_CAPSULE | Freq: Every day | ORAL | Status: DC
  Administered 2022-02-25 – 2022-02-27 (×2): 100 mg via ORAL
  Filled 2022-02-25 (×2): qty 1

## 2022-02-25 MED ORDER — FUROSEMIDE 10 MG/ML IJ SOLN
80.0000 mg | Freq: Two times a day (BID) | INTRAMUSCULAR | Status: AC
  Administered 2022-02-25 (×2): 80 mg via INTRAVENOUS
  Filled 2022-02-25 (×2): qty 8

## 2022-02-25 MED ORDER — FUROSEMIDE 10 MG/ML IJ SOLN: 80.0000 mg | Freq: Once | INTRAMUSCULAR | Status: DC

## 2022-02-25 MED ORDER — PROCHLORPERAZINE EDISYLATE 10 MG/2ML IJ SOLN: 10.0000 mg | Freq: Four times a day (QID) | INTRAMUSCULAR | Status: DC | PRN

## 2022-02-25 MED ORDER — POTASSIUM CHLORIDE CRYS ER 20 MEQ PO TBCR
20.0000 meq | EXTENDED_RELEASE_TABLET | ORAL | Status: DC
  Administered 2022-02-25: 20 meq via ORAL
  Filled 2022-02-25: qty 1

## 2022-02-25 NOTE — Progress Notes (Addendum)
Patient ID: Logan Crawford, male   DOB: [birth year], 47 y.o.   MRN: [id number]   Advanced Heart Failure Rounding Note  PCP-Cardiologist: Kate Sable, MD   Subjective:   Yesterday diuresed with IV lasix. Started on farxiga and spiro increased. Brisk diuresis noted.   Remains on milrinone 0.125.    Short of breath after walking.   Echo (12/29): EF 50-55%, RV normal, mild MR, mild AI.    Objective:   Weight Range: 104.2 kg Body mass index is 31.16 kg/m.   Vital Signs:   Temp:  [97.6 F (36.4 C)-99.4 F (37.4 C)] 97.9 F (36.6 C) (01/02 0410) Pulse Rate:  [69-90] 82 (01/02 0600) Resp:  [12-26] 16 (01/02 0600) BP: (118-156)/(56-97) 147/73 (01/02 0600) SpO2:  [85 %-100 %] 90 % (01/02 0600) Weight:  [104.2 kg] 104.2 kg (01/02 0500) Last BM Date : 02/24/22  Weight change: Filed Weights   02/23/22 0500 02/24/22 0500 02/25/22 0500  Weight: 108.4 kg 105.2 kg 104.2 kg    Intake/Output:   Intake/Output Summary (Last 24 hours) at 02/25/2022 0719 Last data filed at 02/25/2022 0700 Gross per 24 hour  Intake 694.97 ml  Output 4301 ml  Net -3606.03 ml    CVP 12-13   Physical Exam    General: Sitting in the chair.  No resp difficulty HEENT: normal Neck: supple. JVP 11-12. Carotids 2+ bilat; no bruits. No lymphadenopathy or thryomegaly appreciated. RIJ Cor: PMI nondisplaced. Regular rate & rhythm. No rubs, gallops or murmurs. Chest dressing with drainage marked.  Lungs: Decreased in the bases on room air.  Abdomen: soft, nontender, nondistended. No hepatosplenomegaly. No bruits or masses. Good bowel sounds. Extremities: no cyanosis, clubbing, rash, R and LLE compression stockings. 1+ edema Neuro: alert & orientedx3, cranial nerves grossly intact. moves all 4 extremities w/o difficulty. Affect pleasant .   Telemetry   SR 70-80s personally checked.    Labs    CBC Recent Labs    02/24/22 0406 02/25/22 0421  WBC 14.3* 12.9*  HGB 8.2* 8.6*  HCT 24.3* 25.8*  MCV  92.4 92.1  PLT 232 [id number]   Basic Metabolic Panel Recent Labs    02/24/22 0406 02/25/22 0421  NA 137 140  K 3.7 3.3*  CL 105 99  CO2 27 27  GLUCOSE 102* 90  BUN 27* 21*  CREATININE 1.64* 1.51*  CALCIUM 8.5* 8.9  MG 2.0  --    Liver Function Tests No results for input(s): "AST", "ALT", "ALKPHOS", "BILITOT", "PROT", "ALBUMIN" in the last 72 hours.  No results for input(s): "LIPASE", "AMYLASE" in the last 72 hours. Cardiac Enzymes No results for input(s): "CKTOTAL", "CKMB", "CKMBINDEX", "TROPONINI" in the last 72 hours.   BNP: BNP (last 3 results) Recent Labs    06/27/21 1707 11/17/21 0043  BNP 18.9 23.6    ProBNP (last 3 results) No results for input(s): "PROBNP" in the last 8760 hours.   D-Dimer No results for input(s): "DDIMER" in the last 72 hours. Hemoglobin A1C No results for input(s): "HGBA1C" in the last 72 hours. Fasting Lipid Panel No results for input(s): "CHOL", "HDL", "LDLCALC", "TRIG", "CHOLHDL", "LDLDIRECT" in the last 72 hours. Thyroid Function Tests No results for input(s): "TSH", "T4TOTAL", "T3FREE", "THYROIDAB" in the last 72 hours.  Invalid input(s): "FREET3"  Other results:   Imaging    DG CHEST PORT 1 VIEW  Result Date: 02/24/2022 CLINICAL DATA:  Status post CABG. EXAM: PORTABLE CHEST 1 VIEW COMPARISON:  02/23/2022 and prior studies FINDINGS: Cardiomegaly, median sternotomy and  LEFT atrial clip again noted. A RIGHT IJ central venous catheter sheath with tip overlying SVC again identified. Decreased pulmonary vascular congestion/mild interstitial edema noted with continued mild LEFT basilar atelectasis. There is no evidence of pneumothorax. IMPRESSION: Decreased interstitial edema with continued mild LEFT basilar atelectasis. No pneumothorax. Electronically Signed   By: Margarette Canada M.D.   On: 02/24/2022 11:27     Medications:     Scheduled Medications:  amiodarone  400 mg Oral BID   aspirin EC  325 mg Oral Daily   bisacodyl  10 mg Oral  Daily   Chlorhexidine Gluconate Cloth  6 each Topical Daily   dapagliflozin propanediol  10 mg Oral Daily   gabapentin  200 mg Oral BID   heparin injection (subcutaneous)  5,000 Units Subcutaneous Q8H   insulin aspart  0-24 Units Subcutaneous Q4H   lidocaine  2 patch Transdermal Q24H   pantoprazole  40 mg Oral Daily   potassium chloride  20 mEq Oral Q4H   rosuvastatin  40 mg Oral Daily   sodium chloride flush  3 mL Intravenous Q12H   spironolactone  25 mg Oral Daily    Infusions:  sodium chloride 10 mL/hr at 02/25/22 0700    ceFAZolin (ANCEF) IV Stopped (02/25/22 5188)   milrinone 0.125 mcg/kg/min (02/25/22 0700)    PRN Medications: sodium chloride, labetalol, morphine injection, ondansetron (ZOFRAN) IV, oxyCODONE, sodium chloride flush, traMADol   Assessment/Plan    1. Cardiogenic shock, post cardiotomy -S/p CABG 12/27. CHB and severe hyperkalemia prior to placement on cardiopulmonary bypass. Placed on CPB urgently.  -PreCABG echo EF 55% -Echo 12/29 with EF 50-55%, mild AI, mild MR.  -IABP out 12/29.  -He continues on milrinone 0.125, off NE and epinephrine. Creatinine trending down continue milrinone for now for RV support at current dose while diuresing.  No longer checked CO-OX , not accurate.  -CVP 12-13 . Will need to continue IV lasix today.   -Continue spironolactone to 25 mg daily and replace K.  -Continue Farxiga 10 mg daily.     2. CAD with NSTEMI -Hx prior PCI/stent to LAD in 2021 and PCI/stent to LAD in 03/23 d/t in-stent restenosis -NSTEMI this admit. S/p CABG X 3 (LIMA to Diag, SVG to LAD, SVG to RCA) -Aspirin + statin -Admitted with NSTEMI, start Plavix when ok with TCTS.   3. AKI on CKD -Baseline Scr 1.1-1.3, 1.6 pre-op -Cr up to 2.5, now trending down. 1.5.  -Support CO, continue milrinone 0.125 for now for RV.  -Watch with diuresis.   4. Severe Hyperkalemia--> now hypokalemia.  -Etiology uncertain -Occurred prior to initation CPB. Received  dantrolene. Some initial concern for malignant hyperthermia.  -Replace K K.    5. CHB -Occurred prior to placement on CPB, in setting of hyperkalemia -Now in NSR in 60s, pacing wires out.  No further bradycardia noted.    6. Polysubstance abuse -Tobacco and cocaine -UDS + for cocaine on admit  7. Upper sternal drainage - Afebrile, WBCs 12 - Cefazolin IV   8. SDOH: -Hx noncompliance and substance abuse -Uninsured. Will need to engage TOC CSW/CM.    Continue to mobilize. Check TOCCM.   Darrick Grinder, NP  7:19 AM  Patient seen with NP, agree with the above note.    CVP 13 on my read, creatinine continues to trend down at 1.5.  Weight is trending down but still above baseline. He remains on milrinone 0.125.  Co-ox readings have not been accurate.   Breathing improving, less  cough.   General: NAD Neck: JVP 12 cm, no thyromegaly or thyroid nodule.  Lungs: Clear to auscultation bilaterally with normal respiratory effort. CV: Nondisplaced PMI.  Heart regular S1/S2, no S3/S4, no murmur.  1+ ankle edema.  Abdomen: Soft, nontender, no hepatosplenomegaly, no distention.  Skin: Intact without lesions or rashes.  Neurologic: Alert and oriented x 3.  Psych: Normal affect. Extremities: No clubbing or cyanosis.  HEENT: Normal.   Continue Lasix 80 mg IV bid, will continue milrinone until we are finished diuresing.  Continue dapagliflozin and spironolactone.  Creatinine coming down.   Now on cefazolin for upper sternal drainage and leukocytosis.   He remains in NSR, no further heart block.   CRITICAL CARE Performed by: Loralie Champagne  Total critical care time: 35 minutes  Critical care time was exclusive of separately billable procedures and treating other patients.  Critical care was necessary to treat or prevent imminent or life-threatening deterioration.  Critical care was time spent personally by me on the following activities: development of treatment plan with patient and/or  surrogate as well as nursing, discussions with consultants, evaluation of patient's response to treatment, examination of patient, obtaining history from patient or surrogate, ordering and performing treatments and interventions, ordering and review of laboratory studies, ordering and review of radiographic studies, pulse oximetry and re-evaluation of patient's condition.  Loralie Champagne 02/25/2022 7:46 AM

## 2022-02-25 NOTE — Progress Notes (Signed)
Nutrition Education Note  RD consulted for nutrition education regarding a Heart Healthy/Carb Modified diet. Referral placed for outpatient nutrition education once pt is discharged.  RD attached "Heart Healthy, Consistent Carbohydrate Nutrition Therapy" handout from the Academy of [employer]etics to pt's AVS/Discharge Instructions.  No nutrition interventions warranted at this time. If nutrition issues arise, please consult RD.    Gustavus Bryant, MS, RD, LDN Inpatient Clinical Dietitian Please see AMiON for contact information.

## 2022-02-25 NOTE — Progress Notes (Signed)
[facility] [place] Progress Note Patient Name: Logan Crawford DOB: 08-20-1975 MRN: [id number]   Date of Service  02/25/2022  HPI/Events of Note  Nausea - Zofran not effective. Nursing request for second anti-emetic and stool softener.   eICU Interventions  Plan: Compazine 10 mg IV Q 6 hours X 2 doses. Colace 100 mg PO now and Q day.      Intervention Category Major Interventions: Other:  Lysle Dingwall 02/25/2022, 8:34 PM

## 2022-02-25 NOTE — TOC Initial Note (Signed)
Transition of Care [facility]) - Initial/Assessment Note    Patient Details  Name: Logan Crawford MRN: [id number] Date of Birth: 02-18-1976  Transition of Care [facility]) CM/SW Contact:    Erenest Rasher, RN Phone Number: [phone] 02/25/2022, 5:20 PM  Clinical Narrative:                  HF TOC CM spoke to pt. States he lives with his Uncle. Spoke to [employer], they will complete have him sign his Medicaid application in am and start SS disability application with [facility]. Scheduled PCP follow up with Patient [place] for 1/19. Provided pt with application for [facility] in [place]. Pt will need to complete and CM will fax to [facility] so he can establish with PCP closer to home. Will use HF funds/MATCH for medications.   Expected Discharge Plan: IP Rehab Facility Barriers to Discharge: Continued Medical Work up   Patient Goals and CMS Choice Patient states their goals for this hospitalization and ongoing recovery are:: wants him to recover CMS Medicare.gov Compare Post Acute Care list provided to:: Patient Represenative (must comment) (Mother)        Expected Discharge Plan and Services   Discharge Planning Services: CM Consult   Living arrangements for the past 2 months: Mobile Home                                      Prior Living Arrangements/Services Living arrangements for the past 2 months: Mobile Home Lives with:: Relatives   Do you feel safe going back to the place where you live?: Yes      Need for Family Participation in Patient Care: Yes (Comment) Care giver support system in place?: Yes (comment)   Criminal Activity/Legal Involvement Pertinent to Current Situation/Hospitalization: No - Comment as needed  Activities of Daily Living Home Assistive Devices/Equipment: None ADL Screening (condition at time of admission) Patient's cognitive ability adequate to safely complete daily activities?: Yes Is the  patient deaf or have difficulty hearing?: No Does the patient have difficulty seeing, even when wearing glasses/contacts?: No Does the patient have difficulty concentrating, remembering, or making decisions?: No Patient able to express need for assistance with ADLs?: Yes Does the patient have difficulty dressing or bathing?: No Independently performs ADLs?: Yes (appropriate for developmental age) Does the patient have difficulty walking or climbing stairs?: No Weakness of Legs: None Weakness of Arms/Hands: None  Permission Sought/Granted Permission sought to share information with : Case Manager, Family Supports Permission granted to share information with : Yes, Verbal Permission Granted  Share Information with NAME: Brelan Hannen     Permission granted to share info w Relationship: mother  Permission granted to share info w Contact Information: [phone]  Emotional Assessment Appearance:: Appears stated age Attitude/Demeanor/Rapport: Engaged Affect (typically observed): Accepting Orientation: : Oriented to Self, Oriented to Place, Oriented to  Time, Oriented to Situation   Psych Involvement: No (comment)  Admission diagnosis:  NSTEMI (non-ST elevated myocardial infarction) ([place]) [I21.4] S/P CABG x 3 [Z95.1] Patient Active Problem List   Diagnosis Date Noted   S/P CABG x 3 02/19/2022   Cocaine abuse ([place]) 02/13/2022   Chronic diastolic CHF (congestive heart failure) ([place]) 02/11/2022   Polysubstance abuse ([place]) 11/17/2021   Unstable angina ([place]) 11/16/2021   Noncompliance 11/16/2021   Chest pain 05/18/2021   Hypertensive urgency 05/18/2021   CAD (  coronary artery disease) 05/18/2021   Loud snoring 03/28/2020   Gastroesophageal reflux disease without esophagitis    AKI (acute kidney injury) ([place])    NSTEMI (non-ST elevated myocardial infarction) ([place]) 01/24/2020   Tobacco use 11/25/2019   COVID-19 virus infection 11/25/2019   Abnormal EKG 11/25/2019   Recurrent  cellulitis of thigh 11/25/2019   Penile lesion 11/25/2019   Overweight with body mass index (BMI) of 27 to 27.9 in adult    Hyperlipidemia 07/10/2016   Hypertension 10/20/2014   PCP:  Merryl Hacker No Pharmacy:   [facility] [address], [place] - [place] [address] Phone: [phone] Fax: [place] [address] Phone: [phone] Fax: [phone]     Social Determinants of Health (SDOH) Social History: SDOH Screenings   Food Insecurity: No Food Insecurity (02/17/2022)  Housing: Low Risk  (02/17/2022)  Transportation Needs: No Transportation Needs (02/17/2022)  Utilities: Not At Risk (02/17/2022)  Tobacco Use: Medium Risk (02/20/2022)   SDOH Interventions:     Readmission Risk Interventions     No data to display

## 2022-02-25 NOTE — Progress Notes (Addendum)
[facility] DAILY ICU PROGRESS NOTE                   [address]            [place] [address]          [phone]   6 Days Post-Op Procedure(s) (LRB): CORONARY ARTERY BYPASS GRAFTING (CABG) X THREE, USING ENDOSCOPICALLY HARVESTED RIGHT GREATER SAPHENOUS VEIN  AND LEFT ATRIAL APPENDAGE CLIPPING (N/A)  Total Length of Stay:  LOS: 8 days   Subjective: Continues to feel better, walked several laps in the unit, pain controlled, having bowel movements with  Objective: Vital signs in last 24 hours: Temp:  [97.6 F (36.4 C)-99.4 F (37.4 C)] 97.9 F (36.6 C) (01/02 0410) Pulse Rate:  [69-90] 82 (01/02 0600) Cardiac Rhythm: Normal sinus rhythm (01/01 2000) Resp:  [12-26] 16 (01/02 0600) BP: (118-156)/(56-97) 147/73 (01/02 0600) SpO2:  [85 %-100 %] 90 % (01/02 0600) Weight:  [104.2 kg] 104.2 kg (01/02 0500)  Filed Weights   02/23/22 0500 02/24/22 0500 02/25/22 0500  Weight: 108.4 kg 105.2 kg 104.2 kg    Weight change: -1 kg   Hemodynamic parameters for last 24 hours: CVP:  [8 mmHg-15 mmHg] 13 mmHg  Intake/Output from previous day: 01/01 0701 - 01/02 0700 In: 695 [I.V.:295; IV Piggyback:400] Out: 4301 [Urine:4301]  Intake/Output this shift: No intake/output data recorded.  Current Meds: Scheduled Meds:  amiodarone  400 mg Oral BID   aspirin EC  325 mg Oral Daily   bisacodyl  10 mg Oral Daily   Chlorhexidine Gluconate Cloth  6 each Topical Daily   dapagliflozin propanediol  10 mg Oral Daily   gabapentin  200 mg Oral BID   heparin injection (subcutaneous)  5,000 Units Subcutaneous Q8H   insulin aspart  0-24 Units Subcutaneous Q4H   lidocaine  2 patch Transdermal Q24H   pantoprazole  40 mg Oral Daily   potassium chloride  20 mEq Oral Q4H   rosuvastatin  40 mg Oral Daily   sodium chloride flush  3 mL Intravenous Q12H   spironolactone  25 mg Oral Daily   Continuous Infusions:  sodium chloride 10 mL/hr at 02/25/22 0700    ceFAZolin (ANCEF) IV Stopped  (02/25/22 7001)   milrinone 0.125 mcg/kg/min (02/25/22 0700)   PRN Meds:.sodium chloride, labetalol, morphine injection, ondansetron (ZOFRAN) IV, oxyCODONE, sodium chloride flush, traMADol  General appearance: alert, cooperative, and no distress Heart: regular rate and rhythm and soft systolic murmur Lungs: Mildly diminished in the bases Abdomen: Mild distention, soft, nontender Extremities: + Bilateral lower extremity edema Wound: Chest incision healing well, scant bloody drainage, EVH incision healing well   Lab Results: CBC: Recent Labs    02/24/22 0406 02/25/22 0421  WBC 14.3* 12.9*  HGB 8.2* 8.6*  HCT 24.3* 25.8*  PLT 232 292   BMET:  Recent Labs    02/24/22 0406 02/25/22 0421  NA 137 140  K 3.7 3.3*  CL 105 99  CO2 27 27  GLUCOSE 102* 90  BUN 27* 21*  CREATININE 1.64* 1.51*  CALCIUM 8.5* 8.9    CMET: Lab Results  Component Value Date   WBC 12.9 (H) 02/25/2022   HGB 8.6 (L) 02/25/2022   HCT 25.8 (L) 02/25/2022   PLT 292 02/25/2022   GLUCOSE 90 02/25/2022   CHOL 179 11/17/2021   TRIG 284 (H) 11/17/2021   HDL 29 (L) 11/17/2021   LDLCALC 93 11/17/2021   ALT 11 02/22/2022   AST 31  02/22/2022   NA 140 02/25/2022   K 3.3 (L) 02/25/2022   CL 99 02/25/2022   CREATININE 1.51 (H) 02/25/2022   BUN 21 (H) 02/25/2022   CO2 27 02/25/2022   INR 1.3 (H) 02/19/2022   HGBA1C 5.8 (H) 05/18/2021      PT/INR: No results for input(s): "LABPROT", "INR" in the last 72 hours. Radiology: DG CHEST PORT 1 VIEW  Result Date: 02/24/2022 CLINICAL DATA:  Status post CABG. EXAM: PORTABLE CHEST 1 VIEW COMPARISON:  02/23/2022 and prior studies FINDINGS: Cardiomegaly, median sternotomy and LEFT atrial clip again noted. A RIGHT IJ central venous catheter sheath with tip overlying SVC again identified. Decreased pulmonary vascular congestion/mild interstitial edema noted with continued mild LEFT basilar atelectasis. There is no evidence of pneumothorax. IMPRESSION: Decreased  interstitial edema with continued mild LEFT basilar atelectasis. No pneumothorax. Electronically Signed   By: Harmon Pier M.D.   On: 02/24/2022 11:27   Recent Results (from the past 240 hour(s))  SARS CORONAVIRUS 2 (TAT 6-24 HRS) Nasal Mucosa     Status: None   Collection Time: 02/18/22 10:44 AM   Specimen: Nasal Mucosa; Nasal Swab  Result Value Ref Range Status   SARS Coronavirus 2 NEGATIVE NEGATIVE Final    Comment: (NOTE) SARS-CoV-2 target nucleic acids are NOT DETECTED.  The SARS-CoV-2 RNA is generally detectable in upper and lower respiratory specimens during the acute phase of infection. Negative results do not preclude SARS-CoV-2 infection, do not rule out co-infections with other pathogens, and should not be used as the sole basis for treatment or other patient management decisions. Negative results must be combined with clinical observations, patient history, and epidemiological information. The expected result is Negative.  Fact Sheet for Patients: HairSlick.no  Fact Sheet for Healthcare Providers: quierodirigir.com  This test is not yet approved or cleared by the [place] FDA and  has been authorized for detection and/or diagnosis of SARS-CoV-2 by FDA under an Emergency Use Authorization (EUA). This EUA will remain  in effect (meaning this test can be used) for the duration of the COVID-19 declaration under Se ction 564(b)(1) of the Act, 21 U.S.C. section 360bbb-3(b)(1), unless the authorization is terminated or revoked sooner.  Performed at [facility], [address], [place], [place] [address]   Surgical pcr screen     Status: Abnormal   Collection Time: 02/18/22 12:06 PM   Specimen: Nasal Mucosa; Nasal Swab  Result Value Ref Range Status   MRSA, PCR POSITIVE (A) NEGATIVE Final    Comment: [facility] NOTIFIED BETHANY BARNELL RN ON 12.26.23 AT 1523 BY EM    Staphylococcus aureus POSITIVE (A) NEGATIVE  Final    Comment: (NOTE) The Xpert SA Assay (FDA approved for NASAL specimens in patients 21 years of age and older), is one component of a comprehensive surveillance program. It is not intended to diagnose infection nor to guide or monitor treatment. Performed at [facility], [address], [place], [place] [address]   Respiratory (~20 pathogens) panel by PCR     Status: None   Collection Time: 02/24/22  4:52 AM   Specimen: Nasopharyngeal Swab; Respiratory  Result Value Ref Range Status   Adenovirus NOT DETECTED NOT DETECTED Final   Coronavirus 229E NOT DETECTED NOT DETECTED Final    Comment: (NOTE) The Coronavirus on the Respiratory Panel, DOES NOT test for the novel  Coronavirus (2019 nCoV)    Coronavirus HKU1 NOT DETECTED NOT DETECTED Final   Coronavirus NL63 NOT DETECTED NOT DETECTED  Final   Coronavirus OC43 NOT DETECTED NOT DETECTED Final   Metapneumovirus NOT DETECTED NOT DETECTED Final   Rhinovirus / Enterovirus NOT DETECTED NOT DETECTED Final   Influenza A NOT DETECTED NOT DETECTED Final   Influenza B NOT DETECTED NOT DETECTED Final   Parainfluenza Virus 1 NOT DETECTED NOT DETECTED Final   Parainfluenza Virus 2 NOT DETECTED NOT DETECTED Final   Parainfluenza Virus 3 NOT DETECTED NOT DETECTED Final   Parainfluenza Virus 4 NOT DETECTED NOT DETECTED Final   Respiratory Syncytial Virus NOT DETECTED NOT DETECTED Final   Bordetella pertussis NOT DETECTED NOT DETECTED Final   Bordetella Parapertussis NOT DETECTED NOT DETECTED Final   Chlamydophila pneumoniae NOT DETECTED NOT DETECTED Final   Mycoplasma pneumoniae NOT DETECTED NOT DETECTED Final    Comment: Performed at [facility], [place] [address] [place], [place] [address]     Assessment/Plan: S/P Procedure(s) (LRB): CORONARY ARTERY BYPASS GRAFTING (CABG) X THREE, USING ENDOSCOPICALLY HARVESTED RIGHT GREATER SAPHENOUS VEIN  AND LEFT ATRIAL APPENDAGE CLIPPING (N/A) POD #6 1 Tmax 99.4, SBP 180-156 range, NSR,  current drips-milrinone at 0.125, advanced for heart failure team assisting with management- cont diursis 2 oxygen saturations are good on room air 3 excellent urine output, weight continues to trend down, approximately 3 kg greater than preop 4 blood sugars well-controlled, on SSI and Farxiga 5 hypokalemia, replace, AKI-creatinine improving, 1.51 today 6 leukocytosis trend improving-on Ancef, MRSA swab was positive on 12/26- if abx need to be cont, may need to consider Vanco 7 H/H improving trend 4 expected acute blood loss anemia 8 continue pulmonary hygiene and cardiac rehab, may be able to transfer out of ICU soon 9 nursing reports that he does get some duskiness to fingers and toes at times with activity, uncertain etiology, returns fairly quickly and pedals intact-observe     John Giovanni PA-C Pager [phone] 02/25/2022 7:09 AM  DR Tenny Craw ADDENDUM  S/P CABG Perioperative hyperkalemia  Recovery well overall  Likely dc milrinone today Adding plavix  Will cut pacing wires before DC

## 2022-02-25 NOTE — Discharge Instructions (Signed)
Heart Healthy, Consistent Carbohydrate Nutrition Therapy   A heart-healthy and consistent carbohydrate diet is recommended to manage heart disease and diabetes. To follow a heart-healthy and consistent carbohydrate diet, Eat a balanced diet with whole grains, fruits and vegetables, and lean protein sources.  Choose heart-healthy unsaturated fats. Limit saturated fats, trans fats, and cholesterol intake. Eat more plant-based or vegetarian meals using beans and soy foods for protein.  Eat whole, unprocessed foods to limit the amount of sodium (salt) you eat.  Choose a consistent amount of carbohydrate at each meal and snack. Limit refined carbohydrates especially sugar, sweets and sugar-sweetened beverages.  If you drink alcohol, do so in moderation: one serving per day (women) and two servings per day (men). o One serving is equivalent to 12 ounces beer, 5 ounces wine, or 1.5 ounces distilled spirits  Tips for Choosing Heart-Healthy Fats  Choose lean protein and low-fat dairy foods to reduce saturated fat intake. Saturated fat is usually found in animal-based protein and is associated with certain health risks. Saturated fat is the biggest contributor to raise low-density lipoprotein (LDL) cholesterol levels. Research shows that limiting saturated fat lowers unhealthy cholesterol levels. Eat no more than 7% of your total calories each day from saturated fat. Ask your RDN to help you determine how much saturated fat is right for you. There are many foods that do not contain large amounts of saturated fats. Swapping these foods to replace foods high in saturated fats will help you limit the saturated fat you eat and improve your cholesterol levels. You can also try eating more plant-based or vegetarian meals. Instead of. Try:  Whole milk, cheese, yogurt, and ice cream 1% or skim milk, low-fat cheese, non-fat yogurt, and low-fat ice cream  Fatty, marbled beef and pork Lean beef, pork, or venison   Poultry with skin Poultry without skin  Butter, stick margarine Reduced-fat, whipped, or liquid spreads  Coconut oil, palm oil Liquid vegetable oils: corn, canola, olive, soybean and safflower oils   Avoid foods that contain trans fats. Trans fats increase levels of LDL-cholesterol. Hydrogenated fat in processed foods is the main source of trans fats in foods.  Trans fats can be found in stick margarine, shortening, processed sweets, baked goods, some fried foods, and packaged foods made with hydrogenated oils. Avoid foods with "partially hydrogenated oil" on the ingredient list such as: cookies, pastries, baked goods, biscuits, crackers, microwave popcorn, and frozen dinners.  Choose foods with heart healthy fats. Polyunsaturated and monounsaturated fat are unsaturated fats that may help lower your blood cholesterol level when used in place of saturated fat in your diet. Ask your RDN about taking a dietary supplement with plant sterols and stanols to help lower your cholesterol level. Research shows that substituting saturated fats with unsaturated fats is beneficial to cholesterol levels. Try these easy swaps: Instead of. Try:  Butter, stick margarine, or solid shortening Reduced-fat, whipped, or liquid spreads  Beef, pork, or poultry with skin Fish and seafood  Chips, crackers, snack foods Raw or unsalted nuts and seeds or nut butters Hummus with vegetables Avocado on toast  Coconut oil, palm oil Liquid vegetable oils: corn, canola, olive, soybean and safflower oils   Limit the amount of cholesterol you eat to less than 200 milligrams per day. Cholesterol is a substance carried through the bloodstream via lipoproteins, which are known as "transporters" of fat. Some body functions need cholesterol to work properly, but too much cholesterol in the bloodstream can damage arteries and build up blood  vessel linings (which can lead to heart attack and stroke). You should eat less than 200  milligrams cholesterol per day. People respond differently to eating cholesterol. There is no test available right now that can figure out which people will respond more to dietary cholesterol and which will respond less. For individuals with high intake of dietary cholesterol, different types of increase (none, small, moderate, large) in LDL-cholesterol levels are all possible.  Food sources of cholesterol include egg yolks and organ meats such as liver, gizzards. Limit egg yolks to two to four per week and avoid organ meats like liver and gizzards to control cholesterol intake.  Tips for Choosing Heart-Healthy Carbohydrates  Consume a consistent amount of carbohydrate It is important to eat foods with carbohydrates in moderation because they impact your blood glucose level. Carbohydrates can be found in many foods such as: Grains (breads, crackers, rice, pasta, and cereals)  Starchy Vegetables (potatoes, corn, and peas)  Beans and legumes  Milk, soy milk, and yogurt  Fruit and fruit juice  Sweets (cakes, cookies, ice cream, jam and jelly) Your RDN will help you set a goal for how many carbohydrate servings to eat at your meals and snacks. For many adults, eating 3 to 5 servings of carbohydrate foods at each meal and 1 or 2 carbohydrate servings for each snack works well.  Check your blood glucose level regularly. It can tell you if you need to adjust when you eat carbohydrates.  Choose foods rich in viscous (soluble) fiber Viscous, or soluble, is found in the walls of plant cells. Viscous fiber is found only in plant-based foods. Eating foods with fiber helps to lower your unhealthy cholesterol and keep your blood glucose in range  Rich sources of viscous fiber include vegetables (asparagus, Brussels sprouts, sweet potatoes, turnips) fruit (apricots, mangoes, oranges), legumes, and whole grains (barley, oats, and oat bran).  As you increase your fiber intake gradually, also increase the amount  of water you drink. This will help prevent constipation.  If you have difficulty achieving this goal, ask your RDN about fiber laxatives. Choose fiber supplements made with viscous fibers such as psyllium seed husks or methylcellulose to help lower unhealthy cholesterol.   Limit refined carbohydrates  There are three types of carbohydrates: starches, sugar, and fiber. Some carbohydrates occur naturally in food, like the starches in rice or corn or the sugars in fruits and milk. Refined carbohydrates--foods with high amounts of simple sugars--can raise triglyceride levels. High triglyceride levels are associated with coronary heart disease. Some examples of refined carbohydrate foods are table sugar, sweets, and beverages sweetened with added sugar.  Tips for Reducing Sodium (Salt) Although sodium is important for your body to function, too much sodium can be harmful for people with high blood pressure. As sodium and fluid buildup in your tissues and bloodstream, your blood pressure increases. High blood pressure may cause damage to other organs and increase your risk for a stroke. Even if you take a pill for blood pressure or a water pill (diuretic) to remove fluid, it is still important to have less salt in your diet. Ask your doctor and RDN what amount of sodium is right for you. Avoid processed foods. Eat more fresh foods.  Fresh fruits and vegetables are naturally low in sodium, as well as frozen vegetables and fruits that have no added juices or sauces.  Fresh meats are lower in sodium than processed meats, such as bacon, sausage, and hotdogs. Read the nutrition label or ask  your butcher to help you find a fresh meat that is low in sodium. Eat less salt--at the table and when cooking.  A single teaspoon of table salt has 2,300 mg of sodium.  Leave the salt out of recipes for pasta, casseroles, and soups.  Ask your RDN how to cook your favorite recipes without sodium Be a smart shopper.  Look  for food packages that say "salt-free" or "sodium-free." These items contain less than 5 milligrams of sodium per serving.  "Very low-sodium" products contain less than 35 milligrams of sodium per serving.  "Low-sodium" products contain less than 140 milligrams of sodium per serving.  Beware for "Unsalted" or "No Added Salt" products. These items may still be high in sodium. Check the nutrition label. Add flavors to your food without adding sodium.  Try lemon juice, lime juice, fruit juice or vinegar.  Dry or fresh herbs add flavor. Try basil, bay leaf, dill, rosemary, parsley, sage, dry mustard, nutmeg, thyme, and paprika.  Pepper, red pepper flakes, and cayenne pepper can add spice t your meals without adding sodium. Hot sauce contains sodium, but if you use just a drop or two, it will not add up to much.  Buy a sodium-free seasoning blend or make your own at home.  Additional Lifestyle Tips  Achieve and maintain a healthy weight. Talk with your RDN or your doctor about what is a healthy weight for you. Set goals to reach and maintain that weight.  To lose weight, reduce your calorie intake along with increasing your physical activity. A weight loss of 10 to 15 pounds could reduce LDL-cholesterol by 5 milligrams per deciliter.  Participate in physical activity. Talk with your health care team to find out what types of physical activity are best for you. Set a plan to get about 30 minutes of exercise on most days.  Foods Recommended Food Group Foods Recommended  Grains Whole grain breads and cereals, including whole wheat, barley, rye, buckwheat, corn, teff, quinoa, millet, amaranth, brown or wild rice, sorghum, and oats Pasta, especially whole wheat or other whole grain types  [employer], quinoa or wild rice Whole grain crackers, bread, rolls, pitas Home-made bread with reduced-sodium baking soda  Protein Foods Lean cuts of beef and pork (loin, leg, round, extra lean hamburger)   Skinless Cytogeneticist and other wild game Dried beans and peas Nuts and nut butters Meat alternatives made with soy or textured vegetable protein  Egg whites or egg substitute Cold cuts made with lean meat or soy protein  Dairy Nonfat (skim), low-fat, or 1%-fat milk  Nonfat or low-fat yogurt or cottage cheese Fat-free and low-fat cheese  Vegetables Fresh, frozen, or canned vegetables without added fat or salt   Fruits Fresh, frozen, canned, or dried fruit   Oils Unsaturated oils (corn, olive, peanut, soy, sunflower, canola)  Soft or liquid margarines and vegetable oil spreads  Salad dressings Seeds and nuts  Avocado   Foods Not Recommended Food Group Foods Not Recommended  Grains Breads or crackers topped with salt Cereals (hot or cold) with more than 300 mg sodium per serving Biscuits, cornbread, and other "quick" breads prepared with baking soda Bread crumbs or stuffing mix from a store High-fat bakery products, such as doughnuts, biscuits, croissants, danish pastries, pies, cookies Instant cooking foods to which you add hot water and stir--potatoes, noodles, rice, etc. Packaged starchy foods--seasoned noodle or rice dishes, stuffing mix, macaroni and cheese dinner Snacks made with partially hydrogenated oils, including chips, cheese  puffs, snack mixes, regular crackers, butter-flavored popcorn  Protein Foods Higher-fat cuts of meats (ribs, t-bone steak, regular hamburger) Bacon, sausage, or hot dogs Cold cuts, such as salami or bologna, deli meats, cured meats, corned beef Organ meats (liver, brains, gizzards, sweetbreads) Poultry with skin Fried or smoked meat, poultry, and fish Whole eggs and egg yolks (more than 2-4 per week) Salted legumes, nuts, seeds, or nut/seed butters Meat alternatives with high levels of sodium (>300 mg per serving) or saturated fat (>5 g per serving)  Dairy Whole milk,?2% fat milk, buttermilk Whole milk yogurt or ice  cream Cream Half-&-half Cream cheese Sour cream Cheese  Vegetables Canned or frozen vegetables with salt, fresh vegetables prepared with salt, butter, cheese, or cream sauce Fried vegetables Pickled vegetables such as olives, pickles, or sauerkraut  Fruits Fried fruits Fruits served with butter or cream  Oils Butter, stick margarine, shortening Partially hydrogenated oils or trans fats Tropical oils (coconut, palm, palm kernel oils)  Other Candy, sugar sweetened soft drinks and desserts Salt, sea salt, garlic salt, and seasoning mixes containing salt Bouillon cubes Ketchup, barbecue sauce, Worcestershire sauce, soy sauce, teriyaki sauce Miso Salsa Pickles, olives, relish   Heart Healthy Consistent Carbohydrate Vegetarian (Lacto-Ovo) Sample 1-Day Menu  Breakfast 1 cup oatmeal, cooked (2 carbohydrate servings)   cup blueberries (1 carbohydrate serving)  11 almonds, without salt  1 cup 1% milk (1 carbohydrate serving)  1 cup coffee  Morning Snack 1 cup fat-free plain yogurt (1 carbohydrate serving)  Lunch 1 whole wheat bun (1 carbohydrate servings)  1 black bean burger (1 carbohydrate servings)  1 slice cheddar cheese, low sodium  2 slices tomatoes  2 leaves lettuce  1 teaspoon mustard  1 small pear (1 carbohydrate servings)  1 cup green tea, unsweetened  Afternoon Snack 1/3 cup trail mix with nuts, seeds, and raisins, without salt (1 carbohydrate servinga)  Evening Meal  cup meatless chicken  2/3 cup brown rice, cooked (2 carbohydrate servings)  1 cup broccoli, cooked (2/3 carbohydrate serving)   cup carrots, cooked (1/3 carbohydrate serving)  2 teaspoons olive oil  1 teaspoon balsamic vinegar  1 whole wheat dinner roll (1 carbohydrate serving)  1 teaspoon margarine, soft, tub  1 cup 1% milk (1 carbohydrate serving)  Evening Snack 1 extra small banana (1 carbohydrate serving)  1 tablespoon peanut butter   Heart Healthy Consistent Carbohydrate Vegan Sample 1-Day  Menu  Breakfast 1 cup oatmeal, cooked (2 carbohydrate servings)   cup blueberries (1 carbohydrate serving)  11 almonds, without salt  1 cup soymilk fortified with calcium, vitamin B12, and vitamin D  1 cup coffee  Morning Snack 6 ounces soy yogurt (1 carbohydrate servings)  Lunch 1 whole wheat bun(1 carbohydrate servings)  1 black bean burger (1 carbohydrate serving)  2 slices tomatoes  2 leaves lettuce  1 teaspoon mustard  1 small pear (1 carbohydrate servings)  1 cup green tea, unsweetened  Afternoon Snack 1/3 cup trail mix with nuts, seeds, and raisins, without salt (1 carbohydrate servings)  Evening Meal  cup meatless chicken  2/3 cup brown rice, cooked (2 carbohydrate servings)  1 cup broccoli, cooked (2/3 carbohydrate serving)   cup carrots, cooked (1/3 carbohydrate serving)  2 teaspoons olive oil  1 teaspoon balsamic vinegar  1 whole wheat dinner roll (1 carbohydrate serving)  1 teaspoon margarine, soft, tub  1 cup soymilk fortified with calcium, vitamin B12, and vitamin D  Evening Snack 1 extra small banana (1 carbohydrate serving)  1 tablespoon  peanut butter    Heart Healthy Consistent Carbohydrate Sample 1-Day Menu  Breakfast 1 cup cooked oatmeal (2 carbohydrate servings)  3/4 cup blueberries (1 carbohydrate serving)  1 ounce almonds  1 cup skim milk (1 carbohydrate serving)  1 cup coffee  Morning Snack 1 cup sugar-free nonfat yogurt (1 carbohydrate serving)  Lunch 2 slices whole-wheat bread (2 carbohydrate servings)  2 ounces lean [place] breast  1 ounce low-fat Swiss cheese  1 teaspoon mustard  1 slice tomato  1 lettuce leaf  1 small pear (1 carbohydrate serving)  1 cup skim milk (1 carbohydrate serving)  Afternoon Snack 1 ounce trail mix with unsalted nuts, seeds, and raisins (1 carbohydrate serving)  Evening Meal 3 ounces salmon  2/3 cup cooked brown rice (2 carbohydrate servings)  1 teaspoon soft margarine  1 cup cooked broccoli with 1/2 cup cooked  carrots (1 carbohydrate serving  Carrots, cooked, boiled, drained, without salt  1 cup lettuce  1 teaspoon olive oil with vinegar for dressing  1 small whole grain roll (1 carbohydrate serving)  1 teaspoon soft margarine  1 cup unsweetened tea  Evening Snack 1 extra-small banana (1 carbohydrate serving)  Copyright 2020  [employer]. All rights reserved.

## 2022-02-25 NOTE — Progress Notes (Signed)
NAME:  Logan Crawford, MRN:  [id number], DOB:  22-Sep-1975, LOS: 8 ADMISSION DATE:  02/17/2022, CONSULTATION DATE:  12/27 REFERRING MD:  12/27, CHIEF COMPLAINT:  post-op CABG  History of Present Illness:  65 yom w/ hx as outlined below had known 2V CAD presented to ER 12/21 w/ CP c/w prior NSTEMI. Left Heart cath showed re-stenosis of LAD (felt 2/2 non-compliance w DAPT and cocaine abuse). Also found to have sig diseased RCA. Presents to the CVICU 12/27 s/p CABG X 3V w/ placement of IABP, had brief CHB intraoperative w/ potassium noted around 6.8 and pt placed on bypass urgently  PCCM asked to assist w/ post op care   Pertinent  Medical History  CAD . 11/2019 NSTEMI/PCI: LM min irregs, LAD 37m (2.75x22 Resolute Onyx DES)  HFpEF 60-65% HTN, HL, tobacco abuse  Polysubstance abuse.  Significant Hospital Events: Including procedures, antibiotic start and stop dates in addition to other pertinent events   12/21 admitted. Left heart cath: Progression of Severe Multivessel: - 2 potential culprit lesions: distal-mid RCA ulcerated 95% ruptured plaque (mid #2), distal mid LAD stent edge 99% with diffuse disease elsewhere RCA:-proximal #1 70%, followed by proximal #2 70%, mid #1 1 70% and mid #2 95%, ostial PDA 60% LAD: Long stented area with proximal concentric 70% are, jailed D2 85% Zio, distal stent focal 80% followed by segmental 50% then 99% at distal edge. LCx-small OM1 and OM 2 with bifurcating OM 3, AVG circumflex with small branches  12/26 ECHO:  1. Small area of distal septal/inferior basal hypokinesis . Left  ventricular ejection fraction, by estimation, is 55%. The left ventricle  has normal function. The left ventricle has no regional wall motion  abnormalities. There is mild left ventricular hypertrophy. Left ventricular diastolic parameters are consistent with  Grade I diastolic dysfunction (impaired relaxation).  Right ventricular systolic function is normal.  12/27 3 V CABG and  placement of IABP by Enter. Intra-op received 2 units PRBC. Consulted Nephro, 1uPRBC overnight 12/28 - SIMV/PS received 1 unit of blood 12/29 IABP removed and extubated. Potassium has corrected, renal failure is improving.   Interim History / Subjective:  No distress  Objective   Blood pressure (Abnormal) 147/73, pulse 82, temperature 99.2 F (37.3 C), temperature source Axillary, resp. rate 16, height 6' (1.829 m), weight 104.2 kg, SpO2 90 %. CVP:  [8 mmHg-13 mmHg] 13 mmHg      Intake/Output Summary (Last 24 hours) at 02/25/2022 0944 Last data filed at 02/25/2022 1761 Gross per 24 hour  Intake 689.33 ml  Output 3301 ml  Net -2611.67 ml   Filed Weights   02/23/22 0500 02/24/22 0500 02/25/22 0500  Weight: 108.4 kg 105.2 kg 104.2 kg   Physical Examination:  General sitting up in chair no distress HENT NCAT no JVD Pulm clear currently on room air Card rrr Abd soft Ext warn  Neuro intact  Assessment & Plan:  NSTEMI w/ Severe multivessel CAD, now s/p 3 V CABG 12/27 w/ post-operative cardiogenic shock/vasoplegia w/ IABP Plan Cont inotropic support per AHF team to support RV fxn IV lasix today Not checking co-ox as not accurate Cont spiro  Cont farxiga  Acute hypoxic respiratory failure due to pulmonary edema and atelectasis Plan IV diuresis Pain control Mobilize Spirometry   Upper sternal drainage with leukocytosis Plan Day 2 cefazolin.  Trend cbc  CKD stage 2 with severe unexplained hyperkalemia intraoperatively. - Hyperkalemia resolved, cause unclear. Unlikely MH per conversation with [facility] Society consultation.  Plan Cont  to trend   Hypokalemia Plan Replace  Post-op anemia Plan Transfuse for HB <7.0  Secondary cardiac prevention H/o tobacco abuse and poly-substance abuse Plan -Cessation support once appropriate High dose statin ASA now, Plavix at discharge.    Best Practice (right click and "Reselect all SmartList Selections" daily)   Diet/type:  progressive diet DVT prophylaxis: heparin GI prophylaxis: H2B Lines: Central line Foley:  Yes, and it is still needed Code Status:  full code Last date of multidisciplinary goals of care discussion [pending] Erick Colace ACNP-BC [place] Pager # [phone] OR # [phone] if no answer

## 2022-02-26 DIAGNOSIS — Z951 Presence of aortocoronary bypass graft: Secondary | ICD-10-CM

## 2022-02-26 LAB — GLUCOSE, CAPILLARY
Glucose-Capillary: 107 mg/dL — ABNORMAL HIGH (ref 70–99)
Glucose-Capillary: 122 mg/dL — ABNORMAL HIGH (ref 70–99)
Glucose-Capillary: 160 mg/dL — ABNORMAL HIGH (ref 70–99)
Glucose-Capillary: 51 mg/dL — ABNORMAL LOW (ref 70–99)
Glucose-Capillary: 63 mg/dL — ABNORMAL LOW (ref 70–99)
Glucose-Capillary: 80 mg/dL (ref 70–99)
Glucose-Capillary: 88 mg/dL (ref 70–99)

## 2022-02-26 LAB — BASIC METABOLIC PANEL
Anion gap: 11 (ref 5–15)
BUN: 20 mg/dL (ref 6–20)
CO2: 26 mmol/L (ref 22–32)
Calcium: 8.8 mg/dL — ABNORMAL LOW (ref 8.9–10.3)
Chloride: 102 mmol/L (ref 98–111)
Creatinine, Ser: 1.42 mg/dL — ABNORMAL HIGH (ref 0.61–1.24)
GFR, Estimated: >60 mL/min (ref >60)
Glucose, Bld: 121 mg/dL — ABNORMAL HIGH (ref 70–99)
Potassium: 4 mmol/L (ref 3.5–5.1)
Sodium: 139 mmol/L (ref 135–145)

## 2022-02-26 LAB — CBC
HCT: 27.9 % — ABNORMAL LOW (ref 39.0–52.0)
Hemoglobin: 9.2 g/dL — ABNORMAL LOW (ref 13.0–17.0)
MCH: 30.6 pg (ref 26.0–34.0)
MCHC: 33 g/dL (ref 30.0–36.0)
MCV: 92.7 fL (ref 80.0–100.0)
Platelets: 380 10*3/uL (ref 150–400)
RBC: 3.01 MIL/uL — ABNORMAL LOW (ref 4.22–5.81)
RDW: 15 % (ref 11.5–15.5)
WBC: 12.8 10*3/uL — ABNORMAL HIGH (ref 4.0–10.5)
nRBC: 0.6 % — ABNORMAL HIGH (ref 0.0–0.2)

## 2022-02-26 MED ORDER — METOPROLOL SUCCINATE ER 25 MG PO TB24
25.0000 mg | ORAL_TABLET | Freq: Every day | ORAL | Status: DC
  Administered 2022-02-26 – 2022-03-01 (×4): 25 mg via ORAL
  Filled 2022-02-26 (×4): qty 1

## 2022-02-26 MED ORDER — SODIUM CHLORIDE 0.9% FLUSH
3.0000 mL | Freq: Two times a day (BID) | INTRAVENOUS | Status: DC
  Administered 2022-02-26 – 2022-03-01 (×6): 3 mL via INTRAVENOUS

## 2022-02-26 MED ORDER — ONDANSETRON HCL 4 MG PO TABS
4.0000 mg | ORAL_TABLET | Freq: Four times a day (QID) | ORAL | Status: DC | PRN
  Administered 2022-02-27 – 2022-02-28 (×2): 4 mg via ORAL
  Filled 2022-02-26 (×3): qty 1

## 2022-02-26 MED ORDER — TRAMADOL HCL 50 MG PO TABS
50.0000 mg | ORAL_TABLET | ORAL | Status: DC | PRN
  Administered 2022-02-27 (×3): 50 mg via ORAL
  Administered 2022-02-28: 100 mg via ORAL
  Administered 2022-03-01: 50 mg via ORAL
  Filled 2022-02-26 (×3): qty 1
  Filled 2022-02-26: qty 2
  Filled 2022-02-26: qty 1

## 2022-02-26 MED ORDER — MAGNESIUM HYDROXIDE 400 MG/5ML PO SUSP: 30.0000 mL | Freq: Every day | ORAL | Status: DC | PRN

## 2022-02-26 MED ORDER — METOPROLOL TARTRATE 25 MG PO TABS: 25.0000 mg | ORAL_TABLET | Freq: Two times a day (BID) | ORAL | Status: DC

## 2022-02-26 MED ORDER — SODIUM CHLORIDE 0.9% FLUSH: 3.0000 mL | INTRAVENOUS | Status: DC | PRN

## 2022-02-26 MED ORDER — ORAL CARE MOUTH RINSE: 15.0000 mL | OROMUCOSAL | Status: DC | PRN

## 2022-02-26 MED ORDER — CHLORHEXIDINE GLUCONATE CLOTH 2 % EX PADS
6.0000 | MEDICATED_PAD | Freq: Every day | CUTANEOUS | Status: DC
  Administered 2022-02-26 – 2022-03-01 (×3): 6 via TOPICAL

## 2022-02-26 MED ORDER — ONDANSETRON HCL 4 MG/2ML IJ SOLN
4.0000 mg | Freq: Four times a day (QID) | INTRAMUSCULAR | Status: DC | PRN
  Administered 2022-03-01: 4 mg via INTRAVENOUS
  Filled 2022-02-26: qty 2

## 2022-02-26 MED ORDER — ~~LOC~~ CARDIAC SURGERY, PATIENT & FAMILY EDUCATION: Freq: Once | Status: AC

## 2022-02-26 MED ORDER — ACETAMINOPHEN 325 MG PO TABS
650.0000 mg | ORAL_TABLET | Freq: Four times a day (QID) | ORAL | Status: DC | PRN
  Administered 2022-02-26: 650 mg via ORAL
  Filled 2022-02-26: qty 2

## 2022-02-26 MED ORDER — SODIUM CHLORIDE 0.9 % IV SOLN: 250.0000 mL | INTRAVENOUS | Status: DC | PRN

## 2022-02-26 MED ORDER — DOCUSATE SODIUM 100 MG PO CAPS
200.0000 mg | ORAL_CAPSULE | Freq: Every day | ORAL | Status: DC
  Administered 2022-02-28 – 2022-03-01 (×2): 200 mg via ORAL
  Filled 2022-02-26 (×2): qty 2

## 2022-02-26 MED ORDER — ALUM & MAG HYDROXIDE-SIMETH 200-200-20 MG/5ML PO SUSP: 15.0000 mL | ORAL | Status: DC | PRN

## 2022-02-26 MED ORDER — IPRATROPIUM-ALBUTEROL 0.5-2.5 (3) MG/3ML IN SOLN: 3.0000 mL | Freq: Four times a day (QID) | RESPIRATORY_TRACT | Status: DC | PRN

## 2022-02-26 MED ORDER — OXYCODONE HCL 5 MG PO TABS
5.0000 mg | ORAL_TABLET | ORAL | Status: DC | PRN
  Administered 2022-02-27: 5 mg via ORAL
  Administered 2022-02-28: 10 mg via ORAL
  Filled 2022-02-26: qty 1
  Filled 2022-02-26: qty 2

## 2022-02-26 MED ORDER — FUROSEMIDE 40 MG PO TABS: 40.0000 mg | ORAL_TABLET | Freq: Every day | ORAL | Status: DC

## 2022-02-26 MED ORDER — ACETAMINOPHEN 325 MG PO TABS: 650.0000 mg | ORAL_TABLET | Freq: Four times a day (QID) | ORAL | Status: DC | PRN

## 2022-02-26 MED ORDER — DEXTROSE 10 % IV SOLN: INTRAVENOUS | Status: DC

## 2022-02-26 MED ORDER — METOPROLOL SUCCINATE ER 25 MG PO TB24: 25.0000 mg | ORAL_TABLET | Freq: Every day | ORAL | Status: DC

## 2022-02-26 NOTE — Progress Notes (Signed)
TCTS DAILY ICU PROGRESS NOTE                   [address]            [place], [address]          [phone]   7 Days Post-Op Procedure(s) (LRB): CORONARY ARTERY BYPASS GRAFTING (CABG) X THREE, USING ENDOSCOPICALLY HARVESTED RIGHT GREATER SAPHENOUS VEIN  AND LEFT ATRIAL APPENDAGE CLIPPING (N/A)  Total Length of Stay:  LOS: 9 days   Subjective: Continues to steadily feel better  Objective: Vital signs in last 24 hours: Temp:  [98.8 F (37.1 C)-99.6 F (37.6 C)] 99 F (37.2 C) (01/03 0000) Pulse Rate:  [81-94] 94 (01/03 0700) Cardiac Rhythm: Normal sinus rhythm (01/02 2030) Resp:  [12-23] 18 (01/03 0700) BP: (118-196)/(64-92) 118/76 (01/03 0700) SpO2:  [84 %-100 %] 95 % (01/03 0700) Weight:  [101.7 kg] 101.7 kg (01/03 0500)  Filed Weights   02/24/22 0500 02/25/22 0500 02/26/22 0500  Weight: 105.2 kg 104.2 kg 101.7 kg    Weight change: -2.5 kg   Hemodynamic parameters for last 24 hours: CVP:  [2 mmHg-60 mmHg] 6 mmHg  Intake/Output from previous day: 01/02 0701 - 01/03 0700 In: 1125 [P.O.:660; I.V.:241.4; IV Piggyback:223.6] Out: 4401 [Urine:4126; Stool:1]  Intake/Output this shift: No intake/output data recorded.  Current Meds: Scheduled Meds:  amiodarone  400 mg Oral BID   aspirin EC  325 mg Oral Daily   bisacodyl  10 mg Oral Daily   Chlorhexidine Gluconate Cloth  6 each Topical Daily   clopidogrel  75 mg Oral Daily   dapagliflozin propanediol  10 mg Oral Daily   docusate sodium  100 mg Oral Daily   gabapentin  200 mg Oral BID   heparin injection (subcutaneous)  5,000 Units Subcutaneous Q8H   insulin aspart  0-24 Units Subcutaneous Q4H   lidocaine  2 patch Transdermal Q24H   pantoprazole  40 mg Oral Daily   rosuvastatin  40 mg Oral Daily   sodium chloride flush  3 mL Intravenous Q12H   spironolactone  25 mg Oral Daily   Continuous Infusions:  sodium chloride 6 mL/hr at 02/26/22 0700    ceFAZolin (ANCEF) IV 200 mL/hr at 02/26/22 0700    dextrose 40 mL/hr at 02/26/22 0700   PRN Meds:.sodium chloride, acetaminophen, labetalol, morphine injection, ondansetron (ZOFRAN) IV, oxyCODONE, prochlorperazine, sodium chloride flush, traMADol  General appearance: alert, cooperative, and no distress Heart: regular rate and rhythm Lungs: Mildly diminished in the bases Abdomen: Soft, nontender Extremities: Edema almost completely resolved Wound: Scant old drainage on the dressing, sternum stable  Lab Results: CBC: Recent Labs    02/25/22 0421 02/26/22 0445  WBC 12.9* 12.8*  HGB 8.6* 9.2*  HCT 25.8* 27.9*  PLT 292 380   BMET:  Recent Labs    02/25/22 0421 02/26/22 0445  NA 140 139  K 3.3* 4.0  CL 99 102  CO2 27 26  GLUCOSE 90 121*  BUN 21* 20  CREATININE 1.51* 1.42*  CALCIUM 8.9 8.8*    CMET: Lab Results  Component Value Date   WBC 12.8 (H) 02/26/2022   HGB 9.2 (L) 02/26/2022   HCT 27.9 (L) 02/26/2022   PLT 380 02/26/2022   GLUCOSE 121 (H) 02/26/2022   CHOL 179 11/17/2021   TRIG 284 (H) 11/17/2021   HDL 29 (L) 11/17/2021   LDLCALC 93 11/17/2021   ALT 11 02/22/2022   AST 31 02/22/2022   NA 139 02/26/2022  K 4.0 02/26/2022   CL 102 02/26/2022   CREATININE 1.42 (H) 02/26/2022   BUN 20 02/26/2022   CO2 26 02/26/2022   INR 1.3 (H) 02/19/2022   HGBA1C 5.8 (H) 05/18/2021      PT/INR: No results for input(s): "LABPROT", "INR" in the last 72 hours. Radiology: No results found.   Assessment/Plan: S/P Procedure(s) (LRB): CORONARY ARTERY BYPASS GRAFTING (CABG) X THREE, USING ENDOSCOPICALLY HARVESTED RIGHT GREATER SAPHENOUS VEIN  AND LEFT ATRIAL APPENDAGE CLIPPING (N/A)  POD #7  1.  Tmax 99.6, SBP 118-196, normal sinus rhythm, advanced heart failure continues to assist with management.  Plan to stop amiodarone milrinone and give oral diuretics.  CVP 5-6 2 oxygen saturations are good on room air 3 weight continues to trend improved 4 good urine output 5 BUNs/creatinine continue to trend improved 6  leukocytosis is stable-mild 7 on Ancef IV for sternal drainage, is minimal at this point with no definitive evidence of infection but may just be fatty necrosis 8 pulmonary hygiene and cardiac rehab.     Logan Crawford [facility] Pager [phone] 02/26/2022 7:45 AM

## 2022-02-26 NOTE — Progress Notes (Signed)
Hypoglycemic Event  CBG: 51 mg/dL. At 1/3 [place] line access. Patient was alert and oriented.   Treatment:  8 oz juice given quickly, 4 oz over ice per patient request. Additionally given graham crackers with peanut butter to consume.   Symptoms: none  Follow-up CBG: Time: 0418 CBG Result: via fingerstick - 80, via central line - 63. Pt remains alert & oriented.   Possible Reasons for Event: unknown.   Comments/MD notified: Pt states he has no symptoms, ate well before, and has had no significant activity this evening/morning. Pt remains alert & oriented. [place] notified.     Logan Crawford

## 2022-02-26 NOTE — Progress Notes (Signed)
      [address]       [place] [address]             [phone]      Resting comfortably  BP 130/76   Pulse 78   Temp 97.7 F (36.5 C) (Oral)   Resp 18   Ht 6' (1.829 m)   Wt 101.7 kg   SpO2 96%   BMI 30.41 kg/m  RA 95%  Intake/Output Summary (Last 24 hours) at 02/26/2022 1753 Last data filed at 02/26/2022 1400 Gross per 24 hour  Intake 1078.93 ml  Output 3550 ml  Net -2471.07 ml   Off milrinone  Remo Lipps C. Roxan Hockey, MD [facility] [phone]

## 2022-02-26 NOTE — Progress Notes (Incomplete)
Hypoglycemic Event  CBG: 64 @ 02/25/21 23  Treatment: {Hypoglycemic Treatment (must also place order and document administration):3049002}  Symptoms: {Hypoglycemic Symptoms:3049003}  Follow-up CBG: Time:*** CBG Result:***  Possible Reasons for Event: {Possible Reasons for WHQPR:9163846}  Comments/MD notified:***    Olene Craven

## 2022-02-26 NOTE — Progress Notes (Signed)
NAME:  Logan Crawford, MRN:  [id number], DOB:  Nov 14, 1975, LOS: 8 ADMISSION DATE:  02/17/2022, CONSULTATION DATE:  12/27 REFERRING MD:  12/27, CHIEF COMPLAINT:  post-op CABG  History of Present Illness:  20 yom w/ hx as outlined below had known 2V CAD presented to ER 12/21 w/ CP c/w prior NSTEMI. Left Heart cath showed re-stenosis of LAD (felt 2/2 non-compliance w DAPT and cocaine abuse). Also found to have sig diseased RCA. Presents to the CVICU 12/27 s/p CABG X 3V w/ placement of IABP, had brief CHB intraoperative w/ potassium noted around 6.8 and pt placed on bypass urgently  PCCM asked to assist w/ post op care   Pertinent  Medical History  CAD . 11/2019 NSTEMI/PCI: LM min irregs, LAD 17m (2.75x22 Resolute Onyx DES)  HFpEF 60-65% HTN, HL, tobacco abuse  Polysubstance abuse.  Significant Hospital Events: Including procedures, antibiotic start and stop dates in addition to other pertinent events   12/21 admitted. Left heart cath: Progression of Severe Multivessel: - 2 potential culprit lesions: distal-mid RCA ulcerated 95% ruptured plaque (mid #2), distal mid LAD stent edge 99% with diffuse disease elsewhere RCA:-proximal #1 70%, followed by proximal #2 70%, mid #1 1 70% and mid #2 95%, ostial PDA 60% LAD: Long stented area with proximal concentric 70% are, jailed D2 85% Zio, distal stent focal 80% followed by segmental 50% then 99% at distal edge. LCx-small OM1 and OM 2 with bifurcating OM 3, AVG circumflex with small branches  12/26 ECHO:  1. Small area of distal septal/inferior basal hypokinesis . Left  ventricular ejection fraction, by estimation, is 55%. The left ventricle  has normal function. The left ventricle has no regional wall motion  abnormalities. There is mild left ventricular hypertrophy. Left ventricular diastolic parameters are consistent with  Grade I diastolic dysfunction (impaired relaxation).  Right ventricular systolic function is normal.  12/27 3 V CABG and  placement of IABP by Enter. Intra-op received 2 units PRBC. Consulted Nephro, 1uPRBC overnight 12/28 - SIMV/PS received 1 unit of blood 12/29 IABP removed and extubated. Potassium has corrected, renal failure is improving.   Interim History / Subjective:  No distress.   Objective   Blood pressure (Abnormal) 147/73, pulse 82, temperature 99.2 F (37.3 C), temperature source Axillary, resp. rate 16, height 6' (1.829 m), weight 104.2 kg, SpO2 90 %. CVP:  [8 mmHg-13 mmHg] 13 mmHg      Intake/Output Summary (Last 24 hours) at 02/25/2022 0944 Last data filed at 02/25/2022 0904 Gross per 24 hour  Intake 689.33 ml  Output 3301 ml  Net -2611.67 ml   Filed Weights   02/23/22 0500 02/24/22 0500 02/25/22 0500  Weight: 108.4 kg 105.2 kg 104.2 kg   Physical Examination:  General sitting up in chair. No distress HENT NCAT no JVD  Pulm clear room air Card rrr Abd soft Ext warm  Neuro intact   Assessment & Plan:  NSTEMI w/ Severe multivessel CAD, now s/p 3 V CABG 12/27 w/ post-operative cardiogenic shock/vasoplegia w/ IABP Plan Stop milrinone  Start toprol today  Cont spiro and  farxiga Lasix oral   Acute hypoxic respiratory failure due to pulmonary edema and atelectasis Plan Pain control  Diuresis as directed by HF team   Upper sternal drainage with leukocytosis Plan Day 3 cefazolin   CKD stage 2 with severe unexplained hyperkalemia intraoperatively. - Hyperkalemia resolved, cause unclear. Unlikely MH per conversation with [facility] consultation.  Plan Cont to trend   Post-op anemia Plan  Transfuse for hgb < 7   Secondary cardiac prevention H/o tobacco abuse and poly-substance abuse Plan -Cessation support once appropriate High dose statin ASA now, Plavix at discharge.    Best Practice (right click and "Reselect all SmartList Selections" daily)   Diet/type: progressive diet DVT prophylaxis: heparin GI prophylaxis: H2B Lines: Central line Foley:  Yes, and it is  still needed Code Status:  full code Last date of multidisciplinary goals of care discussion [pending] Erick Colace ACNP-BC [place] Pager # [phone] OR # [phone] if no answer

## 2022-02-26 NOTE — Progress Notes (Addendum)
Patient ID: Logan Crawford, male   DOB: 1975/10/20, 47 y.o.   MRN: [id number]   Advanced Heart Failure Rounding Note  PCP-Cardiologist: Kate Sable, MD   Subjective:    Yesterday diuresed with IV lasix. Brisk diuresis noted. Weight down another 5 pounds.   Feels ok. Able to walk around the unit.   Echo (12/29): EF 50-55%, RV normal, mild MR, mild AI.    Objective:   Weight Range: 101.7 kg Body mass index is 30.41 kg/m.   Vital Signs:   Temp:  [98.8 F (37.1 C)-99.6 F (37.6 C)] 99 F (37.2 C) (01/03 0000) Pulse Rate:  [81-94] 94 (01/03 0700) Resp:  [12-23] 18 (01/03 0700) BP: (118-196)/(64-92) 118/76 (01/03 0700) SpO2:  [84 %-100 %] 95 % (01/03 0700) Weight:  [101.7 kg] 101.7 kg (01/03 0500) Last BM Date : 02/25/22  Weight change: Filed Weights   02/24/22 0500 02/25/22 0500 02/26/22 0500  Weight: 105.2 kg 104.2 kg 101.7 kg    Intake/Output:   Intake/Output Summary (Last 24 hours) at 02/26/2022 0732 Last data filed at 02/26/2022 0700 Gross per 24 hour  Intake 1125.04 ml  Output 4127 ml  Net -3001.96 ml   CVP 5-6  Physical Exam  General:  Sitting in the chair. No resp difficulty HEENT: normal Neck: supple. no JVD. Carotids 2+ bilat; no bruits. No lymphadenopathy or thryomegaly appreciated. RIJ introducer.  Cor: PMI nondisplaced. Regular rate & rhythm. No rubs, gallops or murmurs. Lungs: clear Abdomen: soft, nontender, nondistended. No hepatosplenomegaly. No bruits or masses. Good bowel sounds. Extremities: no cyanosis, clubbing, rash, edema Neuro: alert & orientedx3, cranial nerves grossly intact. moves all 4 extremities w/o difficulty. Affect pleasant .   Telemetry  SR 80-90s personally checked.   Labs    CBC Recent Labs    02/25/22 0421 02/26/22 0445  WBC 12.9* 12.8*  HGB 8.6* 9.2*  HCT 25.8* 27.9*  MCV 92.1 92.7  PLT 292 644   Basic Metabolic Panel Recent Labs    02/24/22 0406 02/25/22 0421 02/26/22 0445  NA 137 140 139  K 3.7  3.3* 4.0  CL 105 99 102  CO2 27 27 26   GLUCOSE 102* 90 121*  BUN 27* 21* 20  CREATININE 1.64* 1.51* 1.42*  CALCIUM 8.5* 8.9 8.8*  MG 2.0  --   --    Liver Function Tests No results for input(s): "AST", "ALT", "ALKPHOS", "BILITOT", "PROT", "ALBUMIN" in the last 72 hours.  No results for input(s): "LIPASE", "AMYLASE" in the last 72 hours. Cardiac Enzymes No results for input(s): "CKTOTAL", "CKMB", "CKMBINDEX", "TROPONINI" in the last 72 hours.   BNP: BNP (last 3 results) Recent Labs    06/27/21 1707 11/17/21 0043  BNP 18.9 23.6    ProBNP (last 3 results) No results for input(s): "PROBNP" in the last 8760 hours.   D-Dimer No results for input(s): "DDIMER" in the last 72 hours. Hemoglobin A1C No results for input(s): "HGBA1C" in the last 72 hours. Fasting Lipid Panel No results for input(s): "CHOL", "HDL", "LDLCALC", "TRIG", "CHOLHDL", "LDLDIRECT" in the last 72 hours. Thyroid Function Tests No results for input(s): "TSH", "T4TOTAL", "T3FREE", "THYROIDAB" in the last 72 hours.  Invalid input(s): "FREET3"  Other results:   Imaging    No results found.   Medications:     Scheduled Medications:  amiodarone  400 mg Oral BID   aspirin EC  325 mg Oral Daily   bisacodyl  10 mg Oral Daily   Chlorhexidine Gluconate Cloth  6 each Topical  Daily   clopidogrel  75 mg Oral Daily   dapagliflozin propanediol  10 mg Oral Daily   docusate sodium  100 mg Oral Daily   gabapentin  200 mg Oral BID   heparin injection (subcutaneous)  5,000 Units Subcutaneous Q8H   insulin aspart  0-24 Units Subcutaneous Q4H   lidocaine  2 patch Transdermal Q24H   pantoprazole  40 mg Oral Daily   rosuvastatin  40 mg Oral Daily   sodium chloride flush  3 mL Intravenous Q12H   spironolactone  25 mg Oral Daily    Infusions:  sodium chloride 6 mL/hr at 02/26/22 0700    ceFAZolin (ANCEF) IV 200 mL/hr at 02/26/22 0700   dextrose 40 mL/hr at 02/26/22 0700   milrinone 0.125 mcg/kg/min (02/26/22  0700)    PRN Medications: sodium chloride, labetalol, morphine injection, ondansetron (ZOFRAN) IV, oxyCODONE, prochlorperazine, sodium chloride flush, traMADol   Assessment/Plan    1. Cardiogenic shock, post cardiotomy -S/p CABG 12/27. CHB and severe hyperkalemia prior to placement on cardiopulmonary bypass. Placed on CPB urgently.  -PreCABG echo EF 55% -Echo 12/29 with EF 50-55%, mild AI, mild MR.  -IABP out 12/29.    No longer checked CO-OX , not accurate.  - CVP 5-6. Volume status improved. Stop IV lasix. Start lasix 40 mg daily.  - Stop milrinone. Can remove introducer.  - Stop amio  now that milrinone is off. Start 25 mg daily Toprol.  - Continue spironolactone to 25 mg daily.  - Continue Farxiga 10 mg daily.     2. CAD with NSTEMI -Hx prior PCI/stent to LAD in 2021 and PCI/stent to LAD in 03/23 d/t in-stent restenosis -NSTEMI this admit. S/p CABG X 3 (LIMA to Diag, SVG to LAD, SVG to RCA) -Aspirin + statin -Admitted with NSTEMI, start Plavix when ok with TCTS.   3. AKI on CKD -Baseline Scr 1.1-1.3, 1.6 pre-op -Cr up to 2.5, now trending down. 1.4 .  -Stop milrinone.  -Watch with diuresis.   4. Severe Hyperkalemia--> now hypokalemia.  -Etiology uncertain -Occurred prior to initation CPB. Received dantrolene. Some initial concern for malignant hyperthermia.  -Replace K    5. CHB -Occurred prior to placement on CPB, in setting of hyperkalemia -Now in NSR in 60s, pacing wires out.  No further bradycardia noted.    6. Polysubstance abuse -Tobacco and cocaine -UDS + for cocaine on admit  7. Upper sternal drainage - Afebrile, WBCs 12.8  - Cefazolin IV   8. SDOH: -Hx noncompliance and substance abuse -Uninsured. Will need to engage TOC CSW/CM.    Darrick Grinder 02/26/2022 7:32 AM  Patient seen with NP, agree with the above note.   CVP 5 today, excellent diuresis.  He remains on milrinone 0.125 and amiodarone gtt.   Breathing improved, walking in halls.    General: NAD Neck: No JVD, no thyromegaly or thyroid nodule.  Lungs: Clear to auscultation bilaterally with normal respiratory effort. CV: Nondisplaced PMI.  Heart regular S1/S2, no S3/S4, no murmur.  No peripheral edema.   Abdomen: Soft, nontender, no hepatosplenomegaly, no distention.  Skin: Intact without lesions or rashes.  Neurologic: Alert and oriented x 3.  Psych: Normal affect. Extremities: No clubbing or cyanosis.  HEENT: Normal.   Stop IV Lasix, start Lasix 40 mg daily for now.  Creatinine trending down, 1.4 today.   Stop milrinone today. Continue Farxiga and spironolactone.   He appears to be on amiodarone for pre-op arrest.  Will stop amiodarone today now that we are  starting milrinone.  Start Toprol XL 25 mg daily.  No further evidence for heart block.   Loralie Champagne 02/26/2022 7:54 AM

## 2022-02-26 NOTE — Progress Notes (Signed)
Hypoglycemic Event  CBG: 64 @ 02/25/21 2346  Treatment:  4 oz Juice at 2350   Symptoms:none  Follow-up CBG: Time:02/26/22 0010 CBG Result:160  Possible Reasons for Event: unknown  Comments/MD notified: pt alert and oriented entire time, denies needs at this time.    Olene Craven

## 2022-02-26 NOTE — Discharge Summary (Signed)
[address]       [place] [id number]             [phone]    Physician Discharge Summary  Patient ID: Logan Crawford MRN: [id number] DOB/AGE: [birth year] 47 y.o.  Admit date: 02/17/2022 Discharge date: 03/01/2022  Admission Diagnoses:  Patient Active Problem List   Diagnosis Date Noted   S/P CABG x 3 02/19/2022   Cocaine abuse (HCC) 02/13/2022   Chronic diastolic CHF (congestive heart failure) (HCC) 02/11/2022   Polysubstance abuse (HCC) 11/17/2021   Unstable angina (HCC) 11/16/2021   Noncompliance 11/16/2021   Chest pain 05/18/2021   Hypertensive urgency 05/18/2021   CAD (coronary artery disease) 05/18/2021   Loud snoring 03/28/2020   Gastroesophageal reflux disease without esophagitis    AKI (acute kidney injury) (HCC)    NSTEMI (non-ST elevated myocardial infarction) (HCC) 01/24/2020   Tobacco use 11/25/2019   COVID-19 virus infection 11/25/2019   Abnormal EKG 11/25/2019   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/25/2019   Penile lesion 11/25/2019   Overweight with body mass index (BMI) of 27 to 27.9 in adult    Hyperlipidemia 07/10/2016   Hypertension 10/20/2014     Discharge Diagnoses:  Patient Active Problem List   Diagnosis Date Noted   S/P CABG x 3 02/19/2022   Cocaine abuse (HCC) 02/13/2022   Chronic diastolic CHF (congestive heart failure) (HCC) 02/11/2022   Polysubstance abuse (HCC) 11/17/2021   Unstable angina (HCC) 11/16/2021   Noncompliance 11/16/2021   Chest pain 05/18/2021   Hypertensive urgency 05/18/2021   CAD (coronary artery disease) 05/18/2021   Loud snoring 03/28/2020   Gastroesophageal reflux disease without esophagitis    AKI (acute kidney injury) (HCC)    NSTEMI (non-ST elevated myocardial infarction) (HCC) 01/24/2020   Tobacco use 11/25/2019   COVID-19 virus infection 11/25/2019   Abnormal EKG 11/25/2019   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 male with medical history significant for HTN, polysubstance abuse, tobacco use disorder, CAD s/p PCI in 04/2021, G1 DD, EF 60-65 on  10/2021, hospitalized from 11/16/21 to 11/19/21 with NSTEMI for which she underwent repeat Left cardiac cath on 9/25 showing significant two-vessel coronary artery disease which was  treated medically and on DAPT, He presents to the ED with chest pain typical to prior NSTEMI. EMS administered aspirin and sublingual nitro which relieved his pain but after 20 minutes the pain recurred. Troponin trending up 29> 256> 429>835.  EKG shows ST depression V1 to V3 and aVF. Chest x-ray no acute disease.  Patient is started on IV heparin and admitted for further evaluation.  Patient was evaluated by cardiology, recommended left heart cath. Patient underwent left heart cath 02/13/22 which showed recurrent in-stent restenosis in the LAD in the setting of continued cocaine use and poor compliance with antiplatelet medications.  Patient also has significant progression of the RCA disease.  Cardiologist recommended transfer to [facility] for CABG. He was evaluated by Dr.Enter.  Coronary bypass graft for was offered to the patient.  Mr. Knoth elected to proceed with surgery.  Hospital Course:  Mr. Strader was taken to the OR where CABG x 3 was accomplished.  It is noted in the patient a significant intraoperative  hyperkalemia and bradycardic arrest prior to the initiation of cardiopulmonary bypass.  Intraoperatively he did require placement of intra-aortic balloon pump.  Additionally he was placed on multiple drips for inotropic and vasopressor support.  He was taken to the surgical intensive care unit in critical condition.   Postoperative Hospital Course:  Early postoperative course was notable for requiring significant support  with drips that included epinephrine, Levophed, vasopressin, milrinone as well as intra-aortic balloon pump.  Nephrology, critical care medicine and advanced heart failure consultations were obtained and to assist with management.  He remained intubated on the ventilator at that time showed significant evidence agitation requiring adjustments in narcotics and sedatives.  Blood pressure became elevated with these episodes of agitation.  Potassium has improved but he did not show evidence of acute kidney injury.  Creatinine has shown a steady improvement over time.  He did require atrial pacing until resume normal sinus.  Bedside echocardiogram showed ejection fraction of 55% and the RV appeared to be okay.  The etiology of the hyperkalemia is not entirely clear and there was some concern that it could have been a atypical malignant hyperthermia.  Drips were weaned based on hemodynamics.  Intra-aortic balloon pump was removed on 02/21/2022.  Blood sugars were managed with usual protocols.  He did require significant diuresis due to expected postoperative volume overload.  He also has expected postoperative acute blood loss anemia which is being monitored closely.  He was weaned and extubated on POD #1.  His developed AKI with creatinine level peaking at 2.5.  This trended down without issue.  Patient has history of medicine non-compliance and cocaine abuse.  His toxicology report was positive on admission.  Due to this CSW and care management consult was obtained to assist with medication assistance and post operative needs.  The patient's pacing wires were removed on 02/24/2022.  He had an increased amount of coughing and developed sternal drainage from the top of his sternotomy.  This was observed carefully and resolved prior to discharge. The sternum remained stable and leukocytosis resolved. He otherwise made a progressive recovery and was ready for discharge on 03/01/22. He was tolerating a cardiac diet, having  appropriate bowel and bladder function, maintaining adequate O2 saturations on room air, and was independently mobile.     Consults: cardiology and pulmonary/intensive care  Significant Diagnostic Studies:   DG Chest 2 View  Result Date: 02/27/2022 CLINICAL DATA:  47 year old male status post CABG postoperative day 8. EXAM: CHEST - 2 VIEW COMPARISON:  Portable chest 02/24/2022 and earlier. FINDINGS: PA and lateral views at 0445 hours. Right IJ introducer sheath has been removed. Stable median sternotomy wires and left atrial appendage region clip. Small bilateral pleural effusions. Streaky perihilar atelectasis mostly on the left. No pneumothorax or pulmonary edema. Stable cardiac size and mediastinal contours. No acute osseous abnormality identified. Negative visible bowel gas. IMPRESSION: 1. Right IJ introducer sheath removed. 2. Small bilateral pleural effusions and atelectasis. No pneumothorax or pulmonary edema. Electronically Signed   By: Odessa FlemingH  Hall M.D.   On: 02/27/2022 06:31   DG CHEST PORT 1 VIEW  Result Date: 02/24/2022 CLINICAL DATA:  Status post CABG. EXAM: PORTABLE CHEST 1 VIEW COMPARISON:  02/23/2022 and prior studies FINDINGS: Cardiomegaly, median sternotomy and LEFT atrial clip again noted. A RIGHT IJ central venous catheter sheath with tip overlying SVC again identified. Decreased pulmonary vascular congestion/mild interstitial edema noted with continued mild LEFT basilar atelectasis. There is no evidence of pneumothorax. IMPRESSION: Decreased interstitial edema with  continued mild LEFT basilar atelectasis. No pneumothorax. Electronically Signed   By: Harmon PierJeffrey  Hu M.D.   On: 02/24/2022 11:27   DG CHEST PORT 1 VIEW  Result Date: 02/23/2022 CLINICAL DATA:  Recent cardiac surgery EXAM: PORTABLE CHEST 1 VIEW COMPARISON:  Previous studies including the examination of 02/21/2022 FINDINGS: Transverse diameter of heart is increased. Central pulmonary vessels are prominent. Increased  interstitial and alveolar markings are seen in parahilar regions and lower lung fields, more so on the left side. There is blunting of both lateral CP angles. There is no pneumothorax. Metallic sutures are seen in the sternum. There is a metallic clamp in the region of left atrial appendage. There is interval removal of endotracheal tube, enteric tube and Swan-Ganz catheter. There is interval removal of left chest tube. There is catheter in right IJ with its tip in superior vena cava. There is contrast in the lumen of fundus of the stomach. IMPRESSION: Cardiomegaly. There is prominence of central pulmonary vessels and increased interstitial and alveolar markings in both lungs, more so on the left side suggesting CHF with asymmetric pulmonary edema. Possibility of underlying pneumonia is not excluded. Support devices as described above. Electronically Signed   By: Ernie AvenaPalani  Rathinasamy M.D.   On: 02/23/2022 22:00   ECHOCARDIOGRAM COMPLETE  Result Date: 02/21/2022    ECHOCARDIOGRAM REPORT   Patient Name:   Logan HummerJohnathan C Ciocca Date of Exam: 02/21/2022 Medical Rec #:  [id number]        Height:       72.0 in Accession #:    [id number]       Weight:       252.2 lb Date of Birth:  [birth year]       BSA:          2.351 m Patient Age:    46 years         BP:           114/87 mmHg Patient Gender: M                HR:           80 bpm. Exam Location:  Inpatient Procedure: 2D Echo, Cardiac Doppler, Color Doppler and Intracardiac            Opacification Agent Indications:    NSTEMI                 CHF  History:        Patient has prior history of Echocardiogram examinations, most                 recent 02/19/2022. Risk Factors:Current Smoker and Hypertension.                 S/P CABG 02/19/22. Cocaine abuse.  Sonographer:    Ross LudwigArthur Guy RDCS (AE) Referring Phys: Benay SpiceLINDSAY NICOLE [facility]  Sonographer Comments: Echo performed with patient supine and on artificial respirator and suboptimal parasternal window. Bandages covering subcostal  area. IMPRESSIONS  1. Thickening of the aortic vavle and mitral valve was compared to prior transthoracic echoes and is unchanged. It was not present on TEE, and is there likely artifactual.  2. Left ventricular ejection fraction, by estimation, is 50 to 55%. The left ventricle has low normal function. The left ventricle demonstrates regional wall motion abnormalities (see scoring diagram/findings for description). There is mild left ventricular hypertrophy of the lateral segment. Left ventricular diastolic parameters are consistent with Grade II diastolic dysfunction (pseudonormalization). Elevated left ventricular end-diastolic pressure.  3. Right ventricular systolic function is normal. The right ventricular size is normal.  4. The mitral valve is normal in structure. Mild mitral valve regurgitation. No evidence of mitral stenosis.  5. The aortic valve has an indeterminant number of cusps. There is mild calcification of the aortic valve. There is mild thickening of the aortic valve. Aortic valve regurgitation is mild. No aortic stenosis is present.  6. Aortic dilatation noted. There is mild dilatation of the aortic root, measuring 36 mm.  7. The inferior vena cava is normal in size with greater than 50% respiratory variability, suggesting right atrial pressure of 3 mmHg. FINDINGS  Left Ventricle: Left ventricular ejection fraction, by estimation, is 50 to 55%. The left ventricle has low normal function. The left ventricle demonstrates regional wall motion abnormalities. Definity contrast agent was given IV to delineate the left ventricular endocardial borders. The left ventricular internal cavity size was normal in size. There is mild left ventricular hypertrophy of the lateral segment. Left ventricular diastolic parameters are consistent with Grade II diastolic dysfunction (pseudonormalization). Elevated left ventricular end-diastolic pressure.  LV Wall Scoring: The anterior septum is hypokinetic. The entire  anterior wall, entire lateral wall, inferior septum, entire apex, and entire inferior wall are normal. Right Ventricle: The right ventricular size is normal. No increase in right ventricular wall thickness. Right ventricular systolic function is normal. Left Atrium: Left atrial size was normal in size. Right Atrium: Right atrial size was normal in size. Pericardium: There is no evidence of pericardial effusion. Mitral Valve: The mitral valve is normal in structure. Mild mitral annular calcification. Mild mitral valve regurgitation. No evidence of mitral valve stenosis. Tricuspid Valve: The tricuspid valve is normal in structure. Tricuspid valve regurgitation is trivial. No evidence of tricuspid stenosis. Aortic Valve: The aortic valve has an indeterminant number of cusps. There is mild calcification of the aortic valve. There is mild thickening of the aortic valve. Aortic valve regurgitation is mild. No aortic stenosis is present. Aortic valve mean gradient measures 3.0 mmHg. Aortic valve peak gradient measures 5.8 mmHg. Aortic valve area, by VTI measures 3.49 cm. Pulmonic Valve: The pulmonic valve was normal in structure. Pulmonic valve regurgitation is not visualized. No evidence of pulmonic stenosis. Aorta: Aortic dilatation noted. There is mild dilatation of the aortic root, measuring 36 mm. Venous: The inferior vena cava is normal in size with greater than 50% respiratory variability, suggesting right atrial pressure of 3 mmHg. IAS/Shunts: No atrial level shunt detected by color flow Doppler.  LEFT VENTRICLE PLAX 2D LVIDd:         5.10 cm      Diastology LVIDs:         3.40 cm      LV e' medial:    4.46 cm/s LV PW:         1.50 cm      LV E/e' medial:  23.1 LV IVS:        1.00 cm      LV e' lateral:   3.81 cm/s LVOT diam:     2.30 cm      LV E/e' lateral: 27.0 LV SV:         59 LV SV Index:   25 LVOT Area:     4.15 cm  LV Volumes (MOD) LV vol d, MOD A2C: 114.0 ml LV vol d, MOD A4C: 121.0 ml LV vol s, MOD A2C:  58.3 ml LV vol s, MOD A4C: 77.0 ml LV SV MOD A2C:  55.7 ml LV SV MOD A4C:     121.0 ml LV SV MOD BP:      50.9 ml RIGHT VENTRICLE RV Basal diam:  2.70 cm RV S prime:     6.31 cm/s TAPSE (M-mode): 0.9 cm LEFT ATRIUM           Index        RIGHT ATRIUM           Index LA diam:      3.80 cm 1.62 cm/m   RA Area:     12.10 cm LA Vol (A2C): 38.7 ml 16.46 ml/m  RA Volume:   22.50 ml  9.57 ml/m LA Vol (A4C): 57.6 ml 24.50 ml/m  AORTIC VALVE AV Area (Vmax):    3.45 cm AV Area (Vmean):   3.21 cm AV Area (VTI):     3.49 cm AV Vmax:           120.00 cm/s AV Vmean:          82.600 cm/s AV VTI:            0.168 m AV Peak Grad:      5.8 mmHg AV Mean Grad:      3.0 mmHg LVOT Vmax:         99.70 cm/s LVOT Vmean:        63.900 cm/s LVOT VTI:          0.141 m LVOT/AV VTI ratio: 0.84  AORTA Ao Root diam: 3.60 cm MITRAL VALVE                TRICUSPID VALVE MV Area (PHT): 2.99 cm     TR Peak grad:   13.2 mmHg MV Decel Time: 254 msec     TR Vmax:        182.00 cm/s MV E velocity: 103.00 cm/s MV A velocity: 61.00 cm/s   SHUNTS MV E/A ratio:  1.69         Systemic VTI:  0.14 m                             Systemic Diam: 2.30 cm Chilton Siiffany [place] MD Electronically signed by Chilton Siiffany [place] MD Signature Date/Time: 02/21/2022/11:05:18 AM    Final    DG Chest Port 1 View  Result Date: 02/21/2022 CLINICAL DATA:  Status post CABG and atrial clipping. EXAM: PORTABLE CHEST 1 VIEW COMPARISON:  One-view chest x-ray 02/20/2022 FINDINGS: Endotracheal tube is stable at the level clavicles. NG tube terminates in the stomach. Swan-Ganz catheter enters via right IJ approach in terminates in the right main pulmonary outflow tract. Left-sided chest tube and mediastinal drains are stable. A left pleural effusion has increased. Patchy airspace opacity in the left lung is similar the prior exam. No pneumothorax is present. Lung volumes are low. IMPRESSION: 1. Increasing left pleural effusion. 2. Stable patchy airspace opacity in the left lung,  likely reflecting atelectasis. 3. Support apparatus is stable. 4. No pneumothorax. Electronically Signed   By: Marin Robertshristopher  Mattern M.D.   On: 02/21/2022 08:14   DG Chest Port 1 View  Result Date: 02/20/2022 CLINICAL DATA:  CABG. EXAM: PORTABLE CHEST 1 VIEW COMPARISON:  Chest 02/19/2022 FINDINGS: Endotracheal tube in good position. Swan-Ganz catheter in the main pulmonary artery unchanged. NG tube enters the stomach with the tip not visualized. Left chest tube in place. No pneumothorax. Left atrial appendage clip unchanged. Metal marker over the aortic arch possible intra-aortic balloon  pump unchanged. Progression of bilateral airspace disease left greater than right. Small bilateral effusions. No definite edema. IMPRESSION: 1. Support lines remain in good position. 2. Progression of bilateral airspace disease left greater than right. Probable edema. Electronically Signed   By: Franchot Gallo M.D.   On: 02/20/2022 07:57   DG Abd 1 View  Result Date: 02/19/2022 CLINICAL DATA:  Orogastric tube placement EXAM: ABDOMEN - 1 VIEW COMPARISON:  None Available. FINDINGS: Subdiaphragmatic, right flank, and inferior pelvic regions are excluded from view. Nasogastric tube tip overlies the expected gastric fundus. Visualized abdominal gas pattern is nonobstructive. IMPRESSION: 1. Nasogastric tube tip within the gastric fundus. Electronically Signed   By: Fidela Salisbury M.D.   On: 02/19/2022 20:36   DG Chest Port 1 View  Result Date: 02/19/2022 CLINICAL DATA:  Status post CABG EXAM: PORTABLE CHEST 1 VIEW COMPARISON:  02/11/2022 FINDINGS: Single frontal view of the chest demonstrates endotracheal tube overlying tracheal air column tip at level of thoracic inlet. Enteric catheter passes below diaphragm, tip excluded by collimation. The side port of the enteric catheter projects approximately 5 cm above the gastroesophageal junction. There is a right internal jugular flow directed central venous catheter tip overlying  the main pulmonary outflow tract. Median sternotomy wires and mediastinal clips are noted. The cardiac silhouette is stable. Lung volumes are diminished, with crowding the central vasculature. Patchy bibasilar atelectasis. No effusion or pneumothorax. IMPRESSION: 1. Support devices as above. 2. Low lung volumes. Electronically Signed   By: Randa Ngo M.D.   On: 02/19/2022 17:47   ECHO INTRAOPERATIVE TEE  Result Date: 02/19/2022  *INTRAOPERATIVE TRANSESOPHAGEAL REPORT *  Patient Name:   KELLIN BARTLING Date of Exam: 02/19/2022 Medical Rec #:  [id number]        Height:       72.0 in Accession #:    [id number]       Weight:       222.4 lb Date of Birth:  [birth year]       BSA:          2.23 m Patient Age:    50 years         BP:           136/69 mmHg Patient Gender: M                HR:           68 bpm. Exam Location:  Anesthesiology Transesophogeal exam was perform intraoperatively during surgical procedure. Patient was closely monitored under general anesthesia during the entirety of examination. Indications:     CAD Native Vessel i25.10 Sonographer:     Raquel Sarna Senior RDCS Performing Phys: Adele Barthel MD Diagnosing Phys: Adele Barthel MD Complications: No known complications during this procedure. POST-OP IMPRESSIONS _ Left Ventricle: The left ventricle appeared globally hypokenetic upon seperation from cardiopulmonary bypass. In time it appears unchanged from pre-bypass. _ Right Ventricle: The right ventricle appears unchanged from pre-bypass. _ Aorta: A balloon pump is in place with the tip in the proximal descending aorta. _ Aortic Valve: The aortic valve appears unchanged from pre-bypass. _ Mitral Valve: The mitral valve appears unchanged from pre-bypass. _ Tricuspid Valve: The tricuspid valve appears unchanged from pre-bypass. _ Pulmonic Valve: The pulmonic valve appears unchanged from pre-bypass. _ Interatrial Septum: The interatrial septum appears unchanged from pre-bypass. _ Pericardium: The  pericardium appears unchanged from pre-bypass. PRE-OP FINDINGS  Left Ventricle: The left ventricle has normal systolic function, with an ejection fraction of 55-60%.  The cavity size was normal. There is no increase in left ventricular wall thickness. There is no left ventricular hypertrophy. Right Ventricle: The right ventricle has normal systolic function. The cavity was normal. There is no increase in right ventricular wall thickness. Left Atrium: Left atrial size was normal in size. No left atrial/left atrial appendage thrombus was detected. Right Atrium: Right atrial size was normal in size. Interatrial Septum: No atrial level shunt detected by color flow Doppler. Pericardium: There is no evidence of pericardial effusion. Mitral Valve: The mitral valve is normal in structure. Mitral valve regurgitation is mild by color flow Doppler. Tricuspid Valve: The tricuspid valve was normal in structure. Tricuspid valve regurgitation was not visualized by color flow Doppler. Aortic Valve: The aortic valve is normal in structure. Aortic valve regurgitation was not visualized by color flow Doppler. There is no stenosis of the aortic valve. Pulmonic Valve: The pulmonic valve was normal in structure. Pulmonic valve regurgitation is trivial by color flow Doppler. Aorta: The aortic root, ascending aorta and aortic arch are normal in size and structure.  Karna Christmas MD Electronically signed by Karna Christmas MD Signature Date/Time: 02/19/2022/3:10:32 PM    Final    VAS US DOPPLER PRE CABG  Result Date: 02/18/2022 PREOPERATIVE VASCULAR EVALUATION Patient Name:  Logan Crawford  Date of Exam:   02/18/2022 Medical Rec #: [id number]         Accession #:    [id number] Date of Birth: [birth year]        Patient Gender: M Patient Age:   47 years Exam [facility]:  [facility] Procedure:      VAS US DOPPLER PRE CABG Referring Phys: Reuel Boom ENTER --------------------------------------------------------------------------------   Indications:  Pre-CABG. Risk Factors: Hypertension, hyperlipidemia, past history of smoking, coronary               artery disease. Performing Technologist: Marilynne Halsted RDMS, RVT  Examination Guidelines: A complete evaluation includes B-mode imaging, spectral Doppler, color Doppler, and power Doppler as needed of all accessible portions of each vessel. Bilateral testing is considered an integral part of a complete examination. Limited examinations for reoccurring indications may be performed as noted.  Right Carotid Findings: +----------+--------+--------+--------+--------+------------------+           PSV cm/sEDV cm/sStenosisDescribeComments           +----------+--------+--------+--------+--------+------------------+ CCA Prox  90      21                                         +----------+--------+--------+--------+--------+------------------+ CCA Distal121     31                      intimal thickening +----------+--------+--------+--------+--------+------------------+ ICA Prox  75      24                      intimal thickening +----------+--------+--------+--------+--------+------------------+ ICA Distal51      23                                         +----------+--------+--------+--------+--------+------------------+ ECA       153     19                                         +----------+--------+--------+--------+--------+------------------+ +----------+--------+-------+----------------+------------+  PSV cm/sEDV cmsDescribe        Arm Pressure +----------+--------+-------+----------------+------------+ Subclavian158            Multiphasic, WNL             +----------+--------+-------+----------------+------------+ +---------+--------+--+--------+--+---------+ VertebralPSV cm/s27EDV cm/s13Antegrade +---------+--------+--+--------+--+---------+ Left Carotid Findings: +----------+--------+--------+--------+--------+------------------+            PSV cm/sEDV cm/sStenosisDescribeComments           +----------+--------+--------+--------+--------+------------------+ CCA Prox  95      24                                         +----------+--------+--------+--------+--------+------------------+ CCA Distal115     31                      intimal thickening +----------+--------+--------+--------+--------+------------------+ ICA Prox  79      30                      intimal thickening +----------+--------+--------+--------+--------+------------------+ ICA Distal81      33                                         +----------+--------+--------+--------+--------+------------------+ ECA       144     12                                         +----------+--------+--------+--------+--------+------------------+ +----------+--------+--------+----------------+------------+ SubclavianPSV cm/sEDV cm/sDescribe        Arm Pressure +----------+--------+--------+----------------+------------+           143             Multiphasic, WNL             +----------+--------+--------+----------------+------------+ +---------+--------+--+--------+--+---------+ VertebralPSV cm/s39EDV cm/s17Antegrade +---------+--------+--+--------+--+---------+  ABI Findings: +--------+------------------+-----+---------+--------+ Right   Rt Pressure (mmHg)IndexWaveform Comment  +--------+------------------+-----+---------+--------+ Brachial                       triphasic         +--------+------------------+-----+---------+--------+ ATA                            triphasic         +--------+------------------+-----+---------+--------+ PTA                            triphasic         +--------+------------------+-----+---------+--------+ +--------+------------------+-----+---------+-------+ Left    Lt Pressure (mmHg)IndexWaveform Comment +--------+------------------+-----+---------+-------+ Brachial                        triphasic        +--------+------------------+-----+---------+-------+ ATA                            triphasic        +--------+------------------+-----+---------+-------+ PTA                            triphasic        +--------+------------------+-----+---------+-------+  Right Doppler Findings: +--------+--------+-----+---------+--------+ Site    PressureIndexDoppler  Comments +--------+--------+-----+---------+--------+ Brachial             triphasic         +--------+--------+-----+---------+--------+ Radial               biphasic          +--------+--------+-----+---------+--------+ Ulnar                biphasic          +--------+--------+-----+---------+--------+  Left Doppler Findings: +--------+--------+-----+---------+--------+ Site    PressureIndexDoppler  Comments +--------+--------+-----+---------+--------+ Brachial             triphasic         +--------+--------+-----+---------+--------+   Summary: Right Carotid: Velocities in the right ICA are consistent with a 1-39% stenosis. Left Carotid: Velocities in the left ICA are consistent with a 1-39% stenosis. Vertebrals: Bilateral vertebral arteries demonstrate antegrade flow. Right ABI: Normal waveforms. Left ABI: Normal waveforms. Right Upper Extremity: Doppler waveforms remain within normal limits with right radial compression. Doppler waveform obliterate with right ulnar compression. Left Upper Extremity: Doppler waveforms remain within normal limits with left radial compression. Doppler waveforms remain within normal limits with left ulnar compression.  Electronically signed by Heath Lark on 02/18/2022 at 4:34:15 PM.    Final    CT chest w/o contrast  Result Date: 02/18/2022 CLINICAL DATA:  Preop evaluation prior to CABG. EXAM: CT CHEST WITHOUT CONTRAST TECHNIQUE: Multidetector CT imaging of the chest was performed following the standard protocol without IV contrast.  RADIATION DOSE REDUCTION: This exam was performed according to the departmental dose-optimization program which includes automated exposure control, adjustment of the mA and/or kV according to patient size and/or use of iterative reconstruction technique. COMPARISON:  None Available. FINDINGS: Cardiovascular: No significant vascular findings. Normal heart size. No pericardial effusion. Thoracic aortic atherosclerosis. Coronary artery atherosclerosis with dense calcification in the LAD and a LAD stent. Mediastinum/Nodes: No enlarged mediastinal or axillary lymph nodes. Thyroid gland, trachea, and esophagus demonstrate no significant findings. Small hiatal hernia. Lungs/Pleura: Lungs are clear. No pleural effusion or pneumothorax. Bilateral upper paramediastinal blebs. Upper Abdomen: No acute abnormality. Musculoskeletal: No chest wall mass or suspicious bone lesions identified. IMPRESSION: 1. No acute intrathoracic findings. 2. Coronary artery atherosclerosis with dense calcification in the LAD and a LAD stent. 3. Small hiatal hernia. 4.  Aortic Atherosclerosis (ICD10-I70.0). Electronically Signed   By: Elige Ko M.D.   On: 02/18/2022 12:14   ECHOCARDIOGRAM LIMITED  Result Date: 02/18/2022    ECHOCARDIOGRAM REPORT   Patient Name:   Logan Crawford Date of Exam: 02/18/2022 Medical Rec #:  [id number]        Height:       72.0 in Accession #:    [id number]       Weight:       222.4 lb Date of Birth:  1975/04/10       BSA:          2.229 m Patient Age:    46 years         BP:           138/81 mmHg Patient Gender: M                HR:           68 bpm. Exam Location:  Inpatient Procedure: Limited Echo, Color Doppler and Cardiac Doppler Indications:    other cardiac sounds  History:        Patient has prior history of Echocardiogram examinations, most  recent 02/12/2022. CAD, Signs/Symptoms:Chest Pain; Risk                 Factors:Current Smoker, Hypertension and Dyslipidemia.  Sonographer:     Delcie Roch RDCS Referring Phys: [id number] DANIEL H ENTER IMPRESSIONS  1. Small area of distal septal/inferior basal hypokinesis . Left ventricular ejection fraction, by estimation, is 55%. The left ventricle has normal function. The left ventricle has no regional wall motion abnormalities. There is mild left ventricular hypertrophy. Left ventricular diastolic parameters are consistent with Grade I diastolic dysfunction (impaired relaxation).  2. Right ventricular systolic function is normal. The right ventricular size is normal.  3. The mitral valve is abnormal. Mild mitral valve regurgitation. No evidence of mitral stenosis.  4. The aortic valve is tricuspid. Aortic valve regurgitation is not visualized. No aortic stenosis is present.  5. The inferior vena cava is normal in size with greater than 50% respiratory variability, suggesting right atrial pressure of 3 mmHg. FINDINGS  Left Ventricle: Small area of distal septal/inferior basal hypokinesis. Left ventricular ejection fraction, by estimation, is 55%. The left ventricle has normal function. The left ventricle has no regional wall motion abnormalities. The left ventricular  internal cavity size was normal in size. There is mild left ventricular hypertrophy. Left ventricular diastolic parameters are consistent with Grade I diastolic dysfunction (impaired relaxation). Right Ventricle: The right ventricular size is normal. No increase in right ventricular wall thickness. Right ventricular systolic function is normal. Left Atrium: Left atrial size was normal in size. Right Atrium: Right atrial size was normal in size. Pericardium: There is no evidence of pericardial effusion. Mitral Valve: The mitral valve is abnormal. There is mild thickening of the mitral valve leaflet(s). Mild mitral annular calcification. Mild mitral valve regurgitation. No evidence of mitral valve stenosis. Tricuspid Valve: The tricuspid valve is normal in structure. Tricuspid valve  regurgitation is not demonstrated. No evidence of tricuspid stenosis. Aortic Valve: The aortic valve is tricuspid. Aortic valve regurgitation is not visualized. No aortic stenosis is present. Pulmonic Valve: The pulmonic valve was normal in structure. Pulmonic valve regurgitation is not visualized. No evidence of pulmonic stenosis. Aorta: The aortic root is normal in size and structure. Venous: The inferior vena cava is normal in size with greater than 50% respiratory variability, suggesting right atrial pressure of 3 mmHg. IAS/Shunts: No atrial level shunt detected by color flow Doppler.  LEFT VENTRICLE PLAX 2D LVIDd:         4.60 cm     Diastology LVIDs:         3.40 cm     LV e' medial:  5.33 cm/s LV PW:         1.10 cm     LV e' lateral: 7.51 cm/s LV IVS:        1.20 cm  LV Volumes (MOD) LV vol d, MOD A2C: 82.3 ml LV vol s, MOD A2C: 35.6 ml LV SV MOD A2C:     46.7 ml IVC IVC diam: 1.30 cm LEFT ATRIUM         Index LA diam:    3.60 cm 1.62 cm/m  AORTIC VALVE LVOT Vmax:   111.00 cm/s LVOT Vmean:  73.900 cm/s LVOT VTI:    0.207 m  AORTA Ao Asc diam: 3.10 cm  SHUNTS Systemic VTI: 0.21 m Charlton Haws MD Electronically signed by Charlton Haws MD Signature Date/Time: 02/18/2022/11:59:28 AM    Final    CARDIAC CATHETERIZATION  Result Date: 02/13/2022   Proximal to distal  LAD stents: Prox LAD to Mid LAD lesion is 70% stenosed. Jailed 2nd Diag lesion is 80% stenosed.   Mid LAD-1 lesion is 80% stenosed. Mid LAD-2 lesion is 50% stenosed.  (All ISR).  Dist LAD lesion is 99% stenosed (at distal stent edge)   Prox RCA-1 lesion is 70% stenosed. Prox RCA-2 lesion is 70% stenosed.   Mid RCA-1 lesion is 70% stenosed.  Mid RCA-2 lesion is 95% stenosed (ulcerated flat lesion)   RV Branch lesion is 60% stenosed.   RPDA lesion is 60% stenosed.   LV end diastolic pressure is severely elevated.   There is no aortic valve stenosis. Progression of Severe Multivessel: - 2 potential culprit lesions: distal-mid RCA ulcerated 95%  ruptured plaque (mid #2), distal mid LAD stent edge 99% with diffuse disease elsewhere RCA:-proximal #1 70%, followed by proximal #2 70%, mid #1 1 70% and mid #2 95%, ostial PDA 60% LAD: Long stented area with proximal concentric 70% are, jailed D2 85% Zio, distal stent focal 80% followed by segmental 50% then 99% at distal edge. LCx-small OM1 and OM 2 with bifurcating OM 3, AVG circumflex with small branches Recommendation: Restart IV heparin 2 hours after sheath removal.  CVTS consultation which would mean [facility].  Hospitalist and cardiology team was notified.  Transfer process has been initiated pending bed He was on IV NTG upon arrival, but this was discontinued and he has been pain-free.  May need to restart. Bryan Lemmaavid Harding, MD  ECHOCARDIOGRAM LIMITED  Result Date: 02/12/2022    ECHOCARDIOGRAM LIMITED REPORT   Patient Name:   Logan HummerJOHNATHAN C Crawford Date of Exam: 02/12/2022 Medical Rec #:  [id number]        Height:       72.0 in Accession #:    [id number]       Weight:       210.0 lb Date of Birth:  1976-01-03       BSA:          2.175 m Patient Age:    46 years         BP:           137/92 mmHg Patient Gender: M                HR:           85 bpm. Exam Location:  [facility] Procedure: Limited Echo, Cardiac Doppler and Color Doppler Indications:     NSTEMI I21.4  History:         Patient has prior history of Echocardiogram examinations, most                  recent 11/16/2021. CAD; Risk Factors:Hypertension. Ischemic                  cardiomyopathy, tobacco abuse.  Sonographer:     Cristela BlueJerry Hege Referring Phys:  [id number] Debbe OdeaBRIAN AGBOR-ETANG Diagnosing Phys: Debbe OdeaBrian Agbor-Etang MD  Sonographer Comments: Image quality was fair. IMPRESSIONS  1. Left ventricular ejection fraction, by estimation, is 60 to 65%. The left ventricle has normal function. The left ventricle has no regional wall motion abnormalities.  2. Right ventricular systolic function is normal. The right ventricular size is normal.  3. The  mitral valve is normal in structure. Mild mitral valve regurgitation. FINDINGS  Left Ventricle: Left ventricular ejection fraction, by estimation, is 60 to 65%. The left ventricle has normal function. The left ventricle has no regional wall motion abnormalities. Right Ventricle: The  right ventricular size is normal. No increase in right ventricular wall thickness. Right ventricular systolic function is normal. Left Atrium: Left atrial size was normal in size. Right Atrium: Right atrial size was normal in size. Pericardium: There is no evidence of pericardial effusion. Mitral Valve: The mitral valve is normal in structure. Mild mitral valve regurgitation. Pulmonic Valve: The pulmonic valve was not well visualized. Pulmonic valve regurgitation is not visualized. Additional Comments: Spectral Doppler performed. Color Doppler performed.  LEFT VENTRICLE PLAX 2D LVIDd:         4.10 cm LVIDs:         2.50 cm LV PW:         1.30 cm LV IVS:        1.30 cm  Debbe Odea MD Electronically signed by Debbe Odea MD Signature Date/Time: 02/12/2022/1:56:15 PM    Final    DG Chest Port 1 View  Result Date: 02/11/2022 CLINICAL DATA:  Chest pain. EXAM: PORTABLE CHEST 1 VIEW COMPARISON:  November 16, 2021. FINDINGS: The heart size and mediastinal contours are within normal limits. Both lungs are clear. The visualized skeletal structures are unremarkable. IMPRESSION: No active disease. Electronically Signed   By: Lupita Raider M.D.   On: 02/11/2022 18:27      Results for orders placed or performed during the hospital encounter of 02/17/22 (from the past 48 hour(s))  CBC with Differential/Platelet     Status: Abnormal   Collection Time: 02/28/22  8:22 AM  Result Value Ref Range   WBC 14.9 (H) 4.0 - 10.5 K/uL   RBC 3.15 (L) 4.22 - 5.81 MIL/uL   Hemoglobin 9.6 (L) 13.0 - 17.0 g/dL   HCT 10.3 (L) 15.9 - 45.8 %   MCV 90.8 80.0 - 100.0 fL   MCH 30.5 26.0 - 34.0 pg   MCHC 33.6 30.0 - 36.0 g/dL   RDW 59.2 92.4  - 46.2 %   Platelets 432 (H) 150 - 400 K/uL   nRBC 0.0 0.0 - 0.2 %   Neutrophils Relative % 71 %   Neutro Abs 10.7 (H) 1.7 - 7.7 K/uL   Lymphocytes Relative 12 %   Lymphs Abs 1.7 0.7 - 4.0 K/uL   Monocytes Relative 8 %   Monocytes Absolute 1.2 (H) 0.1 - 1.0 K/uL   Eosinophils Relative 7 %   Eosinophils Absolute 1.0 (H) 0.0 - 0.5 K/uL   Basophils Relative 1 %   Basophils Absolute 0.1 0.0 - 0.1 K/uL   Immature Granulocytes 1 %   Abs Immature Granulocytes 0.13 (H) 0.00 - 0.07 K/uL    Comment: Performed at [facility], [address], [place], [place] [address]  Basic metabolic panel     Status: Abnormal   Collection Time: 02/28/22  8:52 AM  Result Value Ref Range   Sodium 135 135 - 145 mmol/L   Potassium 4.3 3.5 - 5.1 mmol/L   Chloride 99 98 - 111 mmol/L   CO2 28 22 - 32 mmol/L   Glucose, Bld 117 (H) 70 - 99 mg/dL    Comment: Glucose reference range applies only to samples taken after fasting for at least 8 hours.   BUN 22 (H) 6 - 20 mg/dL   Creatinine, Ser 7.71 (H) 0.61 - 1.24 mg/dL   Calcium 9.0 8.9 - 16.5 mg/dL   GFR, Estimated >79 >03 mL/min    Comment: (NOTE) Calculated using the CKD-EPI Creatinine Equation (2021)    Anion gap 8 5 - 15  Comment: Performed at [facility], [address], [place], [place] [address]  Basic metabolic panel     Status: Abnormal   Collection Time: 03/01/22  7:13 AM  Result Value Ref Range   Sodium 132 (L) 135 - 145 mmol/L   Potassium 4.3 3.5 - 5.1 mmol/L   Chloride 99 98 - 111 mmol/L   CO2 22 22 - 32 mmol/L   Glucose, Bld 134 (H) 70 - 99 mg/dL    Comment: Glucose reference range applies only to samples taken after fasting for at least 8 hours.   BUN 23 (H) 6 - 20 mg/dL   Creatinine, Ser 1.91 (H) 0.61 - 1.24 mg/dL   Calcium 8.8 (L) 8.9 - 10.3 mg/dL   GFR, Estimated 56 (L) >60 mL/min    Comment: (NOTE) Calculated using the CKD-EPI Creatinine Equation (2021)    Anion gap 11 5 - 15    Comment: Performed at [facility], [address], [place], [place] [address]  CBC     Status: Abnormal   Collection Time: 03/01/22  7:13 AM  Result Value Ref Range   WBC 14.9 (H) 4.0 - 10.5 K/uL   RBC 3.37 (L) 4.22 - 5.81 MIL/uL   Hemoglobin 10.3 (L) 13.0 - 17.0 g/dL   HCT 56.2 (L) 13.0 - 86.5 %   MCV 90.2 80.0 - 100.0 fL   MCH 30.6 26.0 - 34.0 pg   MCHC 33.9 30.0 - 36.0 g/dL   RDW 78.4 69.6 - 29.5 %   Platelets 426 (H) 150 - 400 K/uL   nRBC 0.0 0.0 - 0.2 %    Comment: Performed at [facility], [address], [place], [place] [address]       Treatments: Sugery OPERATIVE NOTE: Patient Name: Logan Crawford Date of Birth: Oct 12, 1975 Date of Operation: 02/19/22   PRE-OPERATIVE DIAGNOSIS: Coronary artery disease Ischemic cardiomyopathy NSTEMI Polysubstance use Tobacco use Hypertension Prior PCI   POST-OPERATIVE DIAGNOSIS: Same   OPERATION: CABG x 3 (LIMA - Diag, SVG - LAD, SVG - RCA) Endoscopic saphenous vein harvest, right leg Insertion of intraaortic balloon pump, right common femoral artery Left atrial appendage closure   SURGEON: Waverly Ferrari Enter MD   ASSISTANT: Jillyn Hidden PA  Discharge Exam: Blood pressure 111/66, pulse 73, temperature 98.1 F (36.7 C), temperature source Axillary, resp. rate 15, height 6' (1.829 m), weight 100.7 kg, SpO2 94 %.  General appearance: alert and cooperative Neurologic: intact Heart: regular rate and rhythm, S1, S2 normal, no murmur Lungs: clear to auscultation bilaterally Abdomen: soft, non-tender; bowel sounds normal Extremities: no edema Wound: chest and leg incisions look ok with no signs of infection, no drainage.   Discharge Medications:  The patient has been discharged on:   1.Beta Blocker:  Yes [  x ]                              No   [   ]                              If No, reason:  2.Ace Inhibitor/ARB: Yes [  x ]                                     No  [    ]  If No,  reason:  3.Statin:   Yes [ x  ]                  No  [   ]                  If No, reason:  4.Ecasa:  Yes  [  x ]                  No   [   ]                  If No, reason:  Patient had ACS upon admission: Y  Plavix/P2Y12 inhibitor: Yes [ y  ]                                      No  [   ]     Discharge Instructions     Amb Referral to Cardiac Rehabilitation   Complete by: As directed    Diagnosis: CABG   CABG X ___: 3   After initial evaluation and assessments completed: Virtual Based Care may be provided alone or in conjunction with Phase 2 Cardiac Rehab based on patient barriers.: Yes   Intensive Cardiac Rehabilitation (ICR) [facility] location only OR Traditional Cardiac Rehabilitation (TCR) *If criteria for ICR are not met will enroll in TCR [facility] only): Yes   Referral to Nutrition and Diabetes Services   Complete by: As directed    Choose type of Diabetes Self-Management Training (DSMT) training services and number of hours requested: Does not apply   Check all special needs that apply to patient requiring 1 on 1 DSMT: Does not apply   DSMT Content: Does not apply   Choose the type of Medical Nutrition Therapy (MNT) and number of hours: Does not apply   FOR MEDICARE PATIENTS: I hereby certify that I am managing this beneficiary's diabetes condition and that the above prescribed training is a necessary part of management.: Does not apply      Allergies as of 03/01/2022   No Known Allergies      Medication List     STOP taking these medications    carvedilol 25 MG tablet Commonly known as: COREG   isosorbide mononitrate 60 MG 24 hr tablet Commonly known as: IMDUR       TAKE these medications    albuterol 108 (90 Base) MCG/ACT inhaler Commonly known as: VENTOLIN HFA Inhale 2 puffs into the lungs every 6 (six) hours as needed for wheezing or shortness of breath.   aspirin EC 81 MG tablet Take 1 tablet (81 mg total) by mouth daily. Swallow whole.   clopidogrel  75 MG tablet Commonly known as: PLAVIX Take 1 tablet (75 mg total) by mouth daily. Start taking on: March 02, 2022   dapagliflozin propanediol 10 MG Tabs tablet Commonly known as: FARXIGA Take 1 tablet (10 mg total) by mouth daily. Start taking on: March 02, 2022   gabapentin 100 MG capsule Commonly known as: NEURONTIN Take 2 capsules (200 mg total) by mouth 2 (two) times daily.   losartan 50 MG tablet Commonly known as: COZAAR Take 1 tablet (50 mg total) by mouth daily. Start taking on: March 02, 2022   metoprolol succinate 25 MG 24 hr tablet Commonly known as: TOPROL-XL Take 1 tablet (25 mg total) by mouth daily. Start taking on: March 02, 2022   nitroGLYCERIN 0.4 MG SL tablet Commonly known as: NITROSTAT Place 1 tablet (0.4 mg total) under the tongue every 5 (five) minutes as needed for chest pain (if chest pain not resolved after 3 doses, please call 911).   oxyCODONE 5 MG immediate release tablet Commonly known as: Oxy IR/ROXICODONE Take 1 tablet (5 mg total) by mouth every 4 (four) hours as needed for up to 7 days for severe pain.   rosuvastatin 40 MG tablet Commonly known as: CRESTOR Take 1 tablet (40 mg total) by mouth daily. Start taking on: March 02, 2022   spironolactone 25 MG tablet Commonly known as: ALDACTONE Take 1 tablet (25 mg total) by mouth daily. Start taking on: March 02, 2022        Follow-up Information     [name], PA-C Follow up on 03/17/2022.   Specialties: Cardiology, Radiology Why: at 8:25am for your cardiology follow up Contact information: [address] [place] [address] [phone]         Enter, Pierre Bali, [place] on 03/06/2022.   Specialties: Cardiothoracic Surgery, Cardiology Why: Your appointment is at 9:30am. Contact information: [address] [place] [address] [place] Follow up on 03/07/2022.    Specialty: Cardiology Why: Follow-up in Advanced heart failure clinic in [place] 03/07/22 at 11am  Please bring all medications with you Contact information: [place] [phone]                Signed:  Antony Odea, PA-C  03/01/2022, 10:05 AM

## 2022-02-26 NOTE — Progress Notes (Signed)
[facility] Progress Note Patient Name: IMANUEL PRUIETT DOB: 01/25/1976 MRN: [id number]   Date of Service  02/26/2022  HPI/Events of Note  Hypoglycemia - Blood glucose = 61 --> 80 --> 63.  eICU Interventions  Plan: D10W IV infusion to run at 40 mL/hour.      Intervention Category Major Interventions: Other:  Lysle Dingwall 02/26/2022, 5:16 AM

## 2022-02-27 ENCOUNTER — Inpatient Hospital Stay (HOSPITAL_COMMUNITY): Payer: Medicaid Other

## 2022-02-27 LAB — CBC
HCT: 27.4 % — ABNORMAL LOW (ref 39.0–52.0)
Hemoglobin: 8.8 g/dL — ABNORMAL LOW (ref 13.0–17.0)
MCH: 29.9 pg (ref 26.0–34.0)
MCHC: 32.1 g/dL (ref 30.0–36.0)
MCV: 93.2 fL (ref 80.0–100.0)
Platelets: 408 10*3/uL — ABNORMAL HIGH (ref 150–400)
RBC: 2.94 MIL/uL — ABNORMAL LOW (ref 4.22–5.81)
RDW: 15 % (ref 11.5–15.5)
WBC: 16.1 10*3/uL — ABNORMAL HIGH (ref 4.0–10.5)
nRBC: 0.1 % (ref 0.0–0.2)

## 2022-02-27 LAB — BASIC METABOLIC PANEL
Anion gap: 10 (ref 5–15)
BUN: 18 mg/dL (ref 6–20)
CO2: 26 mmol/L (ref 22–32)
Calcium: 8.9 mg/dL (ref 8.9–10.3)
Chloride: 101 mmol/L (ref 98–111)
Creatinine, Ser: 1.36 mg/dL — ABNORMAL HIGH (ref 0.61–1.24)
GFR, Estimated: >60 mL/min (ref >60)
Glucose, Bld: 93 mg/dL (ref 70–99)
Potassium: 4.4 mmol/L (ref 3.5–5.1)
Sodium: 137 mmol/L (ref 135–145)

## 2022-02-27 LAB — GLUCOSE, CAPILLARY: Glucose-Capillary: 26 mg/dL — CL (ref 70–99)

## 2022-02-27 MED ORDER — ORAL CARE MOUTH RINSE: 15.0000 mL | OROMUCOSAL | Status: DC | PRN

## 2022-02-27 MED ORDER — FUROSEMIDE 40 MG PO TABS
40.0000 mg | ORAL_TABLET | Freq: Every day | ORAL | Status: DC
  Administered 2022-02-27 – 2022-02-28 (×2): 40 mg via ORAL
  Filled 2022-02-27 (×2): qty 1

## 2022-02-27 MED ORDER — LOSARTAN POTASSIUM 25 MG PO TABS
25.0000 mg | ORAL_TABLET | Freq: Every day | ORAL | Status: DC
  Administered 2022-02-27 – 2022-02-28 (×2): 25 mg via ORAL
  Filled 2022-02-27 (×2): qty 1

## 2022-02-27 NOTE — Progress Notes (Signed)
NAME:  Logan Crawford, MRN:  [id number], DOB:  04/12/1975, LOS: 8 ADMISSION DATE:  02/17/2022, CONSULTATION DATE:  12/27 REFERRING MD:  12/27, CHIEF COMPLAINT:  post-op CABG  History of Present Illness:  26 yom w/ hx as outlined below had known 2V CAD presented to ER 12/21 w/ CP c/w prior NSTEMI. Left Heart cath showed re-stenosis of LAD (felt 2/2 non-compliance w DAPT and cocaine abuse). Also found to have sig diseased RCA. Presents to the CVICU 12/27 s/p CABG X 3V w/ placement of IABP, had brief CHB intraoperative w/ potassium noted around 6.8 and pt placed on bypass urgently  PCCM asked to assist w/ post op care   Pertinent  Medical History  CAD . 11/2019 NSTEMI/PCI: LM min irregs, LAD 11m (2.75x22 Resolute Onyx DES)  HFpEF 60-65% HTN, HL, tobacco abuse  Polysubstance abuse.  Significant Hospital Events: Including procedures, antibiotic start and stop dates in addition to other pertinent events   12/21 admitted. Left heart cath: Progression of Severe Multivessel: - 2 potential culprit lesions: distal-mid RCA ulcerated 95% ruptured plaque (mid #2), distal mid LAD stent edge 99% with diffuse disease elsewhere RCA:-proximal #1 70%, followed by proximal #2 70%, mid #1 1 70% and mid #2 95%, ostial PDA 60% LAD: Long stented area with proximal concentric 70% are, jailed D2 85% Zio, distal stent focal 80% followed by segmental 50% then 99% at distal edge. LCx-small OM1 and OM 2 with bifurcating OM 3, AVG circumflex with small branches  12/26 ECHO:  1. Small area of distal septal/inferior basal hypokinesis . Left  ventricular ejection fraction, by estimation, is 55%. The left ventricle  has normal function. The left ventricle has no regional wall motion  abnormalities. There is mild left ventricular hypertrophy. Left ventricular diastolic parameters are consistent with  Grade I diastolic dysfunction (impaired relaxation).  Right ventricular systolic function is normal.  12/27 3 V CABG and  placement of IABP by Enter. Intra-op received 2 units PRBC. Consulted Nephro, 1uPRBC overnight 12/28 - SIMV/PS received 1 unit of blood 12/29 IABP removed and extubated. Potassium has corrected, renal failure is improving.  Extubated   Interim History / Subjective:  No distress.  Does complain of a little chest discomfort with cough  Objective   Blood pressure (Abnormal) 147/73, pulse 82, temperature 99.2 F (37.3 C), temperature source Axillary, resp. rate 16, height 6' (1.829 m), weight 104.2 kg, SpO2 90 %. CVP:  [8 mmHg-13 mmHg] 13 mmHg      Intake/Output Summary (Last 24 hours) at 02/25/2022 0944 Last data filed at 02/25/2022 4098 Gross per 24 hour  Intake 689.33 ml  Output 3301 ml  Net -2611.67 ml   Filed Weights   02/23/22 0500 02/24/22 0500 02/25/22 0500  Weight: 108.4 kg 105.2 kg 104.2 kg   Physical Examination: General 47 year old male patient sitting up in chair currently in no acute distress HEENT normocephalic atraumatic no jugular venous distention appreciated Pulmonary: Diminished bases no accessory use portable chest x-ray personally reviewed shows left greater than right basilar atelectasis with small pleural effusions Cardiac: Regular rate and rhythm Abdomen: Soft nontender Extremities: Warm dry brisk cap refill Neuro: Awake oriented no focal deficits.  Assessment & Plan:  NSTEMI w/ Severe multivessel CAD, now s/p 3 V CABG 12/27 w/ post-operative cardiogenic shock/vasoplegia w/ IABP Plan Toprol Cont spiro and  farxiga Lasix oral   Cough to of atelectasis and small pleural effusions with intermittent chest discomfort Plan Pain control Mobilize Incentive spirometry Continuing diuresis  CKD stage  2 with severe unexplained hyperkalemia intraoperatively. - Hyperkalemia resolved, cause unclear. Unlikely MH per conversation with [facility] Society consultation.  Plan Continue to trend  Post-op anemia Plan Transfuse for hemoglobin less than 7 Secondary cardiac  prevention H/o tobacco abuse and poly-substance abuse Plan -Cessation support once appropriate High dose statin ASA now, Plavix at discharge.    Best Practice (right click and "Reselect all SmartList Selections" daily)   Diet/type: progressive diet DVT prophylaxis: heparin GI prophylaxis: H2B Lines: Central line Foley:  Yes, and it is still needed Code Status:  full code Last date of multidisciplinary goals of care discussion [pending] Erick Colace ACNP-BC [place] Pager # [phone] OR # [phone] if no answer

## 2022-02-27 NOTE — Progress Notes (Signed)
Pt transported to x-ray with this nurse and transporter via wheelchair. Pt assisted in standing and sitting into wheelchair, as well as standing for x-ray as ordered. NAD noted. No complaints or concerns throughout. Pt remained vitally stable. Pt returned to 2H08 after x-ray completed. Pt assisted back into bed by this nurse without issue.

## 2022-02-27 NOTE — Progress Notes (Addendum)
Patient ID: Logan Crawford, male   DOB: 1975-06-29, 47 y.o.   MRN: [id number]   Advanced Heart Failure Rounding Note  PCP-Cardiologist: Kate Sable, MD   Subjective:   Echo (12/29): EF 50-55%, RV normal, mild MR, mild AI.   Yesterday milrinone stopped.   Feels ok. Denies SOB. Able to walk around the unit.   Objective:   Weight Range: 101.4 kg Body mass index is 30.32 kg/m.   Vital Signs:   Temp:  [97.7 F (36.5 C)-98.4 F (36.9 C)] 98.4 F (36.9 C) (01/04 0400) Pulse Rate:  [71-90] 76 (01/04 0700) Resp:  [14-24] 17 (01/04 0700) BP: (74-171)/(50-91) 144/75 (01/04 0700) SpO2:  [87 %-99 %] 97 % (01/04 0700) Weight:  [101.4 kg] 101.4 kg (01/04 0436) Last BM Date : 02/26/22  Weight change: Filed Weights   02/25/22 0500 02/26/22 0500 02/27/22 0436  Weight: 104.2 kg 101.7 kg 101.4 kg    Intake/Output:   Intake/Output Summary (Last 24 hours) at 02/27/2022 0731 Last data filed at 02/27/2022 0600 Gross per 24 hour  Intake 567.35 ml  Output 3435 ml  Net -2867.65 ml    Physical Exam  General:  In the chair.  No resp difficulty HEENT: normal Neck: supple. no JVD. Carotids 2+ bilat; no bruits. No lymphadenopathy or thryomegaly appreciated. Cor: PMI nondisplaced. Regular rate & rhythm. No rubs, gallops or murmurs. Sternal incision approximated.  Lungs: clear Abdomen: soft, nontender, nondistended. No hepatosplenomegaly. No bruits or masses. Good bowel sounds. Extremities: no cyanosis, clubbing, rash, edema Neuro: alert & orientedx3, cranial nerves grossly intact. moves all 4 extremities w/o difficulty. Affect pleasant  .   Telemetry   SR 70-90  Labs    CBC Recent Labs    02/26/22 0445 02/27/22 0304  WBC 12.8* 16.1*  HGB 9.2* 8.8*  HCT 27.9* 27.4*  MCV 92.7 93.2  PLT 380 [id number]*   Basic Metabolic Panel Recent Labs    02/26/22 0445 02/27/22 0304  NA 139 137  K 4.0 4.4  CL 102 101  CO2 26 26  GLUCOSE 121* 93  BUN 20 18  CREATININE 1.42* 1.36*   CALCIUM 8.8* 8.9   Liver Function Tests No results for input(s): "AST", "ALT", "ALKPHOS", "BILITOT", "PROT", "ALBUMIN" in the last 72 hours.  No results for input(s): "LIPASE", "AMYLASE" in the last 72 hours. Cardiac Enzymes No results for input(s): "CKTOTAL", "CKMB", "CKMBINDEX", "TROPONINI" in the last 72 hours.   BNP: BNP (last 3 results) Recent Labs    06/27/21 1707 11/17/21 0043  BNP 18.9 23.6    ProBNP (last 3 results) No results for input(s): "PROBNP" in the last 8760 hours.   D-Dimer No results for input(s): "DDIMER" in the last 72 hours. Hemoglobin A1C No results for input(s): "HGBA1C" in the last 72 hours. Fasting Lipid Panel No results for input(s): "CHOL", "HDL", "LDLCALC", "TRIG", "CHOLHDL", "LDLDIRECT" in the last 72 hours. Thyroid Function Tests No results for input(s): "TSH", "T4TOTAL", "T3FREE", "THYROIDAB" in the last 72 hours.  Invalid input(s): "FREET3"  Other results:   Imaging    DG Chest 2 View  Result Date: 02/27/2022 CLINICAL DATA:  47 year old male status post CABG postoperative day 8. EXAM: CHEST - 2 VIEW COMPARISON:  Portable chest 02/24/2022 and earlier. FINDINGS: PA and lateral views at 0445 hours. Right IJ introducer sheath has been removed. Stable median sternotomy wires and left atrial appendage region clip. Small bilateral pleural effusions. Streaky perihilar atelectasis mostly on the left. No pneumothorax or pulmonary edema. Stable cardiac size and  mediastinal contours. No acute osseous abnormality identified. Negative visible bowel gas. IMPRESSION: 1. Right IJ introducer sheath removed. 2. Small bilateral pleural effusions and atelectasis. No pneumothorax or pulmonary edema. Electronically Signed   By: Genevie Ann M.D.   On: 02/27/2022 06:31     Medications:     Scheduled Medications:  aspirin EC  325 mg Oral Daily   Chlorhexidine Gluconate Cloth  6 each Topical Daily   clopidogrel  75 mg Oral Daily   dapagliflozin propanediol  10  mg Oral Daily   docusate sodium  100 mg Oral Daily   docusate sodium  200 mg Oral Daily   gabapentin  200 mg Oral BID   heparin injection (subcutaneous)  5,000 Units Subcutaneous Q8H   lidocaine  2 patch Transdermal Q24H   losartan  25 mg Oral Daily   metoprolol succinate  25 mg Oral Daily   pantoprazole  40 mg Oral Daily   rosuvastatin  40 mg Oral Daily   sodium chloride flush  3 mL Intravenous Q12H   spironolactone  25 mg Oral Daily    Infusions:  sodium chloride      ceFAZolin (ANCEF) IV Stopped (02/27/22 0548)    PRN Medications: sodium chloride, acetaminophen, alum & mag hydroxide-simeth, ipratropium-albuterol, magnesium hydroxide, ondansetron **OR** ondansetron (ZOFRAN) IV, mouth rinse, oxyCODONE, sodium chloride flush, traMADol   Assessment/Plan    1. Cardiogenic shock, post cardiotomy -S/p CABG 12/27. CHB and severe hyperkalemia prior to placement on cardiopulmonary bypass. Placed on CPB urgently.  -PreCABG echo EF 55% -Echo 12/29 with EF 50-55%, mild AI, mild MR.  -IABP out 12/29.  - Volume status stable. Start lasix 40 mg daily - Continue 25 mg daily Toprol.  - Start losartan 25 mg daily  - Continue spironolactone to 25 mg daily.  - Continue Farxiga 10 mg daily.     2. CAD with NSTEMI -Hx prior PCI/stent to LAD in 2021 and PCI/stent to LAD in 03/23 d/t in-stent restenosis -NSTEMI this admit. S/p CABG X 3 (LIMA to Diag, SVG to LAD, SVG to RCA) -Aspirin + statin -Admitted with NSTEMI, start Plavix when ok with TCTS.   3. AKI on CKD -Baseline Scr 1.1-1.3, 1.6 pre-op -Cr up to 2.5, now trending down 1.4  -Watch with diuresis.   4. Severe Hyperkalemia--> now hypokalemia.  -Etiology uncertain -Occurred prior to initation CPB. Received dantrolene. Some initial concern for malignant hyperthermia.  K 4.4 today.    5. CHB -Occurred prior to placement on CPB, in setting of hyperkalemia -Now in NSR in 60s, pacing wires out.  No further bradycardia noted.    6.  Polysubstance abuse -Tobacco and cocaine -UDS + for cocaine on admit  7. Upper sternal drainage - Afebrile, WBCs 12.8 ---> 16 . Incision looks ok today.   - Completed course of Cefazolin.    8. SDOH: -Hx noncompliance and substance abuse -Uninsured. Will need to engage TOC CSW/CM.   Continue to mobilize.  Needs to use Incentive Spirometer. Discussed.  Amy Clegg NP-C  02/27/2022 7:31 AM  Patient seen with NP, agree with the above note.   Creatinine down to 1.36, BP mildly elevated.    No complaints, has been walking.   General: NAD Neck: No JVD, no thyromegaly or thyroid nodule.  Lungs: Decreased at bases.  CV: Nondisplaced PMI.  Heart regular S1/S2, no S3/S4, no murmur.  No peripheral edema.  Abdomen: Soft, nontender, no hepatosplenomegaly, no distention.  Skin: Intact without lesions or rashes.  Neurologic: Alert and  oriented x 3.  Psych: Normal affect. Extremities: No clubbing or cyanosis.  HEENT: Normal.   Volume status much improved, now on Lasix 40 mg po daily.   Continue current cardiac meds with addition of losartan 25 mg daily.    WBCs higher at 16 but afebrile.  No longer with sternal drainage and off cefazolin.  Observe for now, follow WBC trend.    Transfer to step-down.   Loralie Champagne 02/27/2022 8:07 AM

## 2022-02-27 NOTE — Progress Notes (Signed)
[address]       [place] [id number]             [phone]     8 Days Post-Op Procedure(s) (LRB): CORONARY ARTERY BYPASS GRAFTING (CABG) X THREE, USING ENDOSCOPICALLY HARVESTED RIGHT GREATER SAPHENOUS VEIN  AND LEFT ATRIAL APPENDAGE CLIPPING (N/A) Subjective: Tired this am but walked a lot  Objective: Vital signs in last 24 hours: Temp:  [97.7 F (36.5 C)-98.4 F (36.9 C)] 98.4 F (36.9 C) (01/04 0400) Pulse Rate:  [71-95] 73 (01/04 0600) Cardiac Rhythm: Normal sinus rhythm (01/03 2150) Resp:  [14-24] 15 (01/04 0600) BP: (74-171)/(50-91) 147/71 (01/04 0600) SpO2:  [87 %-100 %] 96 % (01/04 0600) Weight:  [101.4 kg] 101.4 kg (01/04 0436)  Hemodynamic parameters for last 24 hours: CVP:  [0 mmHg-15 mmHg] 15 mmHg  Intake/Output from previous day: 01/03 0701 - 01/04 0700 In: 567.4 [P.O.:120; I.V.:70.9; IV Piggyback:376.5] Out: 8546 [Urine:3425; Drains:10] Intake/Output this shift: No intake/output data recorded.  General appearance: alert, cooperative, and no distress Heart: regular rate and rhythm Lungs: mildly dim left base Abdomen: benign Extremities: no edema Wound: incis healing well  Lab Results: Recent Labs    02/26/22 0445 02/27/22 0304  WBC 12.8* 16.1*  HGB 9.2* 8.8*  HCT 27.9* 27.4*  PLT 380 408*   BMET:  Recent Labs    02/26/22 0445 02/27/22 0304  NA 139 137  K 4.0 4.4  CL 102 101  CO2 26 26  GLUCOSE 121* 93  BUN 20 18  CREATININE 1.42* 1.36*  CALCIUM 8.8* 8.9    PT/INR: No results for input(s): "LABPROT", "INR" in the last 72 hours. ABG    Component Value Date/Time   PHART 7.344 (L) 02/20/2022 0638   HCO3 19.7 (L) 02/20/2022 0638   TCO2 21 (L) 02/20/2022 0638   ACIDBASEDEF 5.0 (H) 02/20/2022 0638   O2SAT 44.8 02/23/2022 0330   CBG (last 3)  Recent Labs    02/26/22 0820 02/26/22 1130 02/26/22 1611  GLUCAP 122* 88 107*    Meds Scheduled Meds:  aspirin EC  325 mg Oral Daily   Chlorhexidine Gluconate  Cloth  6 each Topical Daily   clopidogrel  75 mg Oral Daily   dapagliflozin propanediol  10 mg Oral Daily   docusate sodium  100 mg Oral Daily   docusate sodium  200 mg Oral Daily   gabapentin  200 mg Oral BID   heparin injection (subcutaneous)  5,000 Units Subcutaneous Q8H   lidocaine  2 patch Transdermal Q24H   metoprolol succinate  25 mg Oral Daily   pantoprazole  40 mg Oral Daily   rosuvastatin  40 mg Oral Daily   sodium chloride flush  3 mL Intravenous Q12H   spironolactone  25 mg Oral Daily   Continuous Infusions:  sodium chloride      ceFAZolin (ANCEF) IV Stopped (02/27/22 0548)   PRN Meds:.sodium chloride, acetaminophen, alum & mag hydroxide-simeth, ipratropium-albuterol, magnesium hydroxide, ondansetron **OR** ondansetron (ZOFRAN) IV, mouth rinse, oxyCODONE, sodium chloride flush, traMADol  Xrays DG Chest 2 View  Result Date: 02/27/2022 CLINICAL DATA:  47 year old male status post CABG postoperative day 8. EXAM: CHEST - 2 VIEW COMPARISON:  Portable chest 02/24/2022 and earlier. FINDINGS: PA and lateral views at 0445 hours. Right IJ introducer sheath has been removed. Stable median sternotomy wires and left atrial appendage region clip. Small bilateral pleural effusions. Streaky perihilar atelectasis mostly on the left. No pneumothorax or pulmonary edema. Stable cardiac  size and mediastinal contours. No acute osseous abnormality identified. Negative visible bowel gas. IMPRESSION: 1. Right IJ introducer sheath removed. 2. Small bilateral pleural effusions and atelectasis. No pneumothorax or pulmonary edema. Electronically Signed   By: Genevie Ann M.D.   On: 02/27/2022 06:31    Assessment/Plan: S/P Procedure(s) (LRB): CORONARY ARTERY BYPASS GRAFTING (CABG) X THREE, USING ENDOSCOPICALLY HARVESTED RIGHT GREATER SAPHENOUS VEIN  AND LEFT ATRIAL APPENDAGE CLIPPING (N/A) POD#8  1 afeb, s BP variable 70's-170's, most recently in HTN range- AHF will add low dose losartan 2 sats good on RA 3  good UOP, weight about same as yesterday 4 drain 10 cc- d/c today 5 creat conts to trend improved, 1.36  6  some increase in leukocytosis, WBC 16 .1 7 H/H stable 8 mild thrombocytosis, PLT 408 K, observe 9 BS fine- stopped SSI 10 CXR- minor atx, small effus 11 cont pulm hygiene and rehab, prob home next 1-2 days if all agree    LOS: 10 days    Logan Crawford 02/27/2022

## 2022-02-27 NOTE — Progress Notes (Signed)
CARDIAC REHAB PHASE I   Pt resting in bed feeling little tired and sore today. Ambulated 740 ft this morning, reports he feels like he over did it a little. Encouraged continued ambulation and IS use. Advised to pay close attention to how he feels during activity.  Encouraged slow and steady progress with activity. Reviewed sternal precautions. Will continue to follow.     [id number]  Vanessa Barbara, RN BSN 02/27/2022 11:21 AM

## 2022-02-27 NOTE — Progress Notes (Signed)
Patient ID: Logan Crawford, male   DOB: 02/11/1976, 47 y.o.   MRN: [id number] TCTS Evening Rounds:  Hemodynamically stable in sinus rhythm.   Good diuresis today.  Waiting on 4E bed.

## 2022-02-28 ENCOUNTER — Other Ambulatory Visit: Payer: Self-pay

## 2022-02-28 LAB — CBC WITH DIFFERENTIAL/PLATELET
Abs Immature Granulocytes: 0.13 10*3/uL — ABNORMAL HIGH (ref 0.00–0.07)
Basophils Absolute: 0.1 10*3/uL (ref 0.0–0.1)
Basophils Relative: 1 %
Eosinophils Absolute: 1 10*3/uL — ABNORMAL HIGH (ref 0.0–0.5)
Eosinophils Relative: 7 %
HCT: 28.6 % — ABNORMAL LOW (ref 39.0–52.0)
Hemoglobin: 9.6 g/dL — ABNORMAL LOW (ref 13.0–17.0)
Immature Granulocytes: 1 %
Lymphocytes Relative: 12 %
Lymphs Abs: 1.7 10*3/uL (ref 0.7–4.0)
MCH: 30.5 pg (ref 26.0–34.0)
MCHC: 33.6 g/dL (ref 30.0–36.0)
MCV: 90.8 fL (ref 80.0–100.0)
Monocytes Absolute: 1.2 10*3/uL — ABNORMAL HIGH (ref 0.1–1.0)
Monocytes Relative: 8 %
Neutro Abs: 10.7 10*3/uL — ABNORMAL HIGH (ref 1.7–7.7)
Neutrophils Relative %: 71 %
Platelets: 432 10*3/uL — ABNORMAL HIGH (ref 150–400)
RBC: 3.15 MIL/uL — ABNORMAL LOW (ref 4.22–5.81)
RDW: 14.6 % (ref 11.5–15.5)
WBC: 14.9 10*3/uL — ABNORMAL HIGH (ref 4.0–10.5)
nRBC: 0 % (ref 0.0–0.2)

## 2022-02-28 LAB — BASIC METABOLIC PANEL
Anion gap: 8 (ref 5–15)
BUN: 22 mg/dL — ABNORMAL HIGH (ref 6–20)
CO2: 28 mmol/L (ref 22–32)
Calcium: 9 mg/dL (ref 8.9–10.3)
Chloride: 99 mmol/L (ref 98–111)
Creatinine, Ser: 1.38 mg/dL — ABNORMAL HIGH (ref 0.61–1.24)
GFR, Estimated: >60 mL/min (ref >60)
Glucose, Bld: 117 mg/dL — ABNORMAL HIGH (ref 70–99)
Potassium: 4.3 mmol/L (ref 3.5–5.1)
Sodium: 135 mmol/L (ref 135–145)

## 2022-02-28 NOTE — Progress Notes (Addendum)
NAME:  Logan Crawford, MRN:  [id number], DOB:  Sep 27, 1975, LOS: 11 ADMISSION DATE:  02/17/2022, CONSULTATION DATE:  12/27 REFERRING MD:  12/27, CHIEF COMPLAINT:  post-op CABG  History of Present Illness:  20 yom w/ hx as outlined below had known 2V CAD presented to ER 12/21 w/ CP c/w prior NSTEMI. Left Heart cath showed re-stenosis of LAD (felt 2/2 non-compliance w DAPT and cocaine abuse). Also found to have sig diseased RCA. Presents to the CVICU 12/27 s/p CABG X 3V w/ placement of IABP, had brief CHB intraoperative w/ potassium noted around 6.8 and pt placed on bypass urgently  PCCM asked to assist w/ post op care   Pertinent  Medical History  CAD . 11/2019 NSTEMI/PCI: LM min irregs, LAD 22m (2.75x22 Resolute Onyx DES)  HFpEF 60-65% HTN, HL, tobacco abuse  Polysubstance abuse.  Significant Hospital Events: Including procedures, antibiotic start and stop dates in addition to other pertinent events   12/21 admitted. Left heart cath: Progression of Severe Multivessel: - 2 potential culprit lesions: distal-mid RCA ulcerated 95% ruptured plaque (mid #2), distal mid LAD stent edge 99% with diffuse disease elsewhere RCA:-proximal #1 70%, followed by proximal #2 70%, mid #1 1 70% and mid #2 95%, ostial PDA 60% LAD: Long stented area with proximal concentric 70% are, jailed D2 85% Zio, distal stent focal 80% followed by segmental 50% then 99% at distal edge. LCx-small OM1 and OM 2 with bifurcating OM 3, AVG circumflex with small branches  12/26 ECHO:  1. Small area of distal septal/inferior basal hypokinesis . Left  ventricular ejection fraction, by estimation, is 55%. The left ventricle  has normal function. The left ventricle has no regional wall motion  abnormalities. There is mild left ventricular hypertrophy. Left ventricular diastolic parameters are consistent with  Grade I diastolic dysfunction (impaired relaxation).  Right ventricular systolic function is normal.  12/27 3 V CABG and  placement of IABP by Enter. Intra-op received 2 units PRBC. Consulted Nephro, 1uPRBC overnight 12/28 - SIMV/PS received 1 unit of blood 12/29 IABP removed and extubated. Potassium has corrected, renal failure is improving.  Extubated  Interim History / Subjective:  No events overnight   Objective   Blood pressure 114/61, pulse 77, temperature (!) 96.9 F (36.1 C), temperature source Axillary, resp. rate 16, height 6' (1.829 m), weight 100.9 kg, SpO2 95 %.        Intake/Output Summary (Last 24 hours) at 02/28/2022 0953 Last data filed at 02/28/2022 0800 Gross per 24 hour  Intake 1385 ml  Output 3525 ml  Net -2140 ml   Filed Weights   02/26/22 0500 02/27/22 0436 02/28/22 0500  Weight: 101.7 kg 101.4 kg 100.9 kg   Physical Examination: General Adult male, sitting in bed, no distress noted  HEENT normocephalic atraumatic no jugular venous distention appreciated Pulmonary: clear breath sounds, no use of accessory muscles  Cardiac: Regular rate and rhythm, surgical incision noted midline chest > old blood noted  Abdomen: Soft nontender Extremities: Warm dry brisk cap refill Neuro: alert, oriented, follows commands   Assessment & Plan:   NSTEMI w/ Severe multivessel CAD, now s/p 3 V CABG 12/27 w/ post-operative cardiogenic shock/vasoplegia w/ IABP Plan Toprol Cont spiro and farxiga Cont statin  Lasix oral   HTN Plan Continue Cozaar   Cough to of atelectasis and small pleural effusions with intermittent chest discomfort Plan Pain control Mobilize Incentive spirometry Continuing diuresis  CKD stage 2 with severe unexplained hyperkalemia intraoperatively> improved  - Hyperkalemia  resolved, cause unclear. Unlikely MH per conversation with [facility] [employer] consultation.  Plan Continue to trend  Post-op anemia Plan Transfuse for hemoglobin less than 7  Secondary cardiac prevention H/o tobacco abuse and poly-substance abuse Plan Cessation support once appropriate High dose  statin ASA, Plavix    Best Practice (right click and "Reselect all SmartList Selections" daily)   Diet/type: progressive diet DVT prophylaxis: heparin GI prophylaxis: H2B Lines: N/A Foley:  N/A Code Status:  full code Last date of multidisciplinary goals of care discussion [pending]  Transfer to floor  Time Spent: 30 minutes   Logan Crawford, AGACNP-BC [place] Pgr: [phone]

## 2022-02-28 NOTE — Progress Notes (Signed)
CARDIAC REHAB PHASE I   Pt back to bed, feeling well today. Pt reports ambulating two times today and sitting in chair most of morning. Tolerating walks well with minimal pain, stable balance and no SOB. Post OHS education including site care, restrictions, risk factors, sternal precautions, smoking and substance use cessation, exercise guidelines, IS use at home, home needs at discharge and CRP2 reviewed. All questions and concerns addressed. Will refer to [facility] for CRP2. Will continue to follow.  [phone] Vanessa Barbara, RN BSN 02/28/2022 2:36 PM

## 2022-02-28 NOTE — Progress Notes (Signed)
Patient ID: Logan Crawford, male   DOB: 04-Jul-1975, 47 y.o.   MRN: [id number]   Advanced Heart Failure Rounding Note  PCP-Cardiologist: Kate Sable, MD   Subjective:    Echo (12/29): EF 50-55%, RV normal, mild MR, mild AI.   He is off milrinone and has diuresed well, weight back to his baseline.   Still with occasional cough.  Has been walking unit.   Objective:   Weight Range: 100.9 kg Body mass index is 30.17 kg/m.   Vital Signs:   Temp:  [96.9 F (36.1 C)-98.7 F (37.1 C)] 96.9 F (36.1 C) (01/05 0700) Pulse Rate:  [69-78] 77 (01/05 0800) Resp:  [12-24] 16 (01/05 0800) BP: (114-149)/(61-88) 114/61 (01/05 0800) SpO2:  [94 %-98 %] 95 % (01/05 0800) Weight:  [100.9 kg] 100.9 kg (01/05 0500) Last BM Date : 02/26/22  Weight change: Filed Weights   02/26/22 0500 02/27/22 0436 02/28/22 0500  Weight: 101.7 kg 101.4 kg 100.9 kg    Intake/Output:   Intake/Output Summary (Last 24 hours) at 02/28/2022 0928 Last data filed at 02/28/2022 0800 Gross per 24 hour  Intake 1625 ml  Output 4025 ml  Net -2400 ml    Physical Exam   General: NAD Neck: No JVD, no thyromegaly or thyroid nodule.  Lungs: Clear to auscultation bilaterally with normal respiratory effort. CV: Nondisplaced PMI.  Heart regular S1/S2, no S3/S4, no murmur.  No peripheral edema.   Abdomen: Soft, nontender, no hepatosplenomegaly, no distention.  Skin: Intact without lesions or rashes.  Neurologic: Alert and oriented x 3.  Psych: Normal affect. Extremities: No clubbing or cyanosis.  HEENT: Normal.     Telemetry   SR 70-90  Labs    CBC Recent Labs    02/27/22 0304 02/28/22 0822  WBC 16.1* 14.9*  NEUTROABS  --  10.7*  HGB 8.8* 9.6*  HCT 27.4* 28.6*  MCV 93.2 90.8  PLT 408* 263*   Basic Metabolic Panel Recent Labs    02/26/22 0445 02/27/22 0304  NA 139 137  K 4.0 4.4  CL 102 101  CO2 26 26  GLUCOSE 121* 93  BUN 20 18  CREATININE 1.42* 1.36*  CALCIUM 8.8* 8.9   Liver  Function Tests No results for input(s): "AST", "ALT", "ALKPHOS", "BILITOT", "PROT", "ALBUMIN" in the last 72 hours.  No results for input(s): "LIPASE", "AMYLASE" in the last 72 hours. Cardiac Enzymes No results for input(s): "CKTOTAL", "CKMB", "CKMBINDEX", "TROPONINI" in the last 72 hours.   BNP: BNP (last 3 results) Recent Labs    06/27/21 1707 11/17/21 0043  BNP 18.9 23.6    ProBNP (last 3 results) No results for input(s): "PROBNP" in the last 8760 hours.   D-Dimer No results for input(s): "DDIMER" in the last 72 hours. Hemoglobin A1C No results for input(s): "HGBA1C" in the last 72 hours. Fasting Lipid Panel No results for input(s): "CHOL", "HDL", "LDLCALC", "TRIG", "CHOLHDL", "LDLDIRECT" in the last 72 hours. Thyroid Function Tests No results for input(s): "TSH", "T4TOTAL", "T3FREE", "THYROIDAB" in the last 72 hours.  Invalid input(s): "FREET3"  Other results:   Imaging    No results found.   Medications:     Scheduled Medications:  aspirin EC  325 mg Oral Daily   Chlorhexidine Gluconate Cloth  6 each Topical Daily   clopidogrel  75 mg Oral Daily   dapagliflozin propanediol  10 mg Oral Daily   docusate sodium  100 mg Oral Daily   docusate sodium  200 mg Oral Daily  furosemide  40 mg Oral Daily   gabapentin  200 mg Oral BID   heparin injection (subcutaneous)  5,000 Units Subcutaneous Q8H   lidocaine  2 patch Transdermal Q24H   losartan  25 mg Oral Daily   metoprolol succinate  25 mg Oral Daily   pantoprazole  40 mg Oral Daily   rosuvastatin  40 mg Oral Daily   sodium chloride flush  3 mL Intravenous Q12H   spironolactone  25 mg Oral Daily    Infusions:  sodium chloride      PRN Medications: sodium chloride, acetaminophen, alum & mag hydroxide-simeth, ipratropium-albuterol, magnesium hydroxide, ondansetron **OR** ondansetron (ZOFRAN) IV, mouth rinse, mouth rinse, oxyCODONE, sodium chloride flush, traMADol   Assessment/Plan    1. Cardiogenic  shock, post cardiotomy - S/p CABG 12/27. CHB and severe hyperkalemia prior to placement on cardiopulmonary bypass. Placed on CPB urgently.  - PreCABG echo EF 55% - Echo 12/29 with EF 50-55%, mild AI, mild MR.  - IABP out 12/29.  - Volume status stable. Can continue Lasix 40 mg daily.  - Continue 25 mg daily Toprol.  - Continue losartan 25 mg daily  - Continue spironolactone to 25 mg daily.  - Continue Farxiga 10 mg daily.     2. CAD with NSTEMI -Hx prior PCI/stent to LAD in 2021 and PCI/stent to LAD in 03/23 d/t in-stent restenosis -NSTEMI this admit. S/p CABG X 3 (LIMA to Diag, SVG to LAD, SVG to RCA) -Aspirin + statin -Admitted with NSTEMI, he is now on Plavix.   3. AKI on CKD -Baseline Scr 1.1-1.3, 1.6 pre-op -Cr up to 2.5, now trending down 1.36 today.    4. Severe Hyperkalemia--> now hypokalemia.  -Etiology uncertain -Occurred prior to initation CPB. Received dantrolene. Some initial concern for malignant hyperthermia.  K 4.4 today.    5. CHB -Occurred prior to placement on CPB, in setting of hyperkalemia -Now in NSR in 60s, pacing wires out.  No further bradycardia noted.    6. Polysubstance abuse -Tobacco and cocaine -UDS + for cocaine on admit  7. Upper sternal drainage - Afebrile, WBCs 12.8 ---> 16 --> 14.9. Incision looks ok today.   - Completed course of Cefazolin.    8. SDOH: -Hx noncompliance and substance abuse -Uninsured. Will need to engage TOC CSW/CM.   Should be ready for home soon, ?today.  Will make followup.   Loralie Champagne 02/28/2022 9:28 AM

## 2022-02-28 NOTE — Progress Notes (Cosign Needed Addendum)
[address]       [place] [id number]             [phone]     9 Days Post-Op Procedure(s) (LRB): CORONARY ARTERY BYPASS GRAFTING (CABG) X THREE, USING ENDOSCOPICALLY HARVESTED RIGHT GREATER SAPHENOUS VEIN  AND LEFT ATRIAL APPENDAGE CLIPPING (N/A) Subjective: C/o some increased pain and some increase in sputum production  Objective: Vital signs in last 24 hours: Temp:  [98 F (36.7 C)-98.7 F (37.1 C)] 98.7 F (37.1 C) (01/05 0400) Pulse Rate:  [69-80] 71 (01/05 0700) Cardiac Rhythm: Normal sinus rhythm (01/04 2118) Resp:  [12-24] 13 (01/05 0700) BP: (119-149)/(75-88) 135/83 (01/05 0400) SpO2:  [94 %-98 %] 96 % (01/05 0700) Weight:  [100.9 kg] 100.9 kg (01/05 0500)  Hemodynamic parameters for last 24 hours:    Intake/Output from previous day: 01/04 0701 - 01/05 0700 In: 1385 [P.O.:1385] Out: 4025 [Urine:4025] Intake/Output this shift: No intake/output data recorded.  General appearance: alert, cooperative, and no distress Heart: regular rate and rhythm Lungs: mildly dim in left base Abdomen: benign Extremities: no edema Wound: incis healing well  Lab Results: Recent Labs    02/26/22 0445 02/27/22 0304  WBC 12.8* 16.1*  HGB 9.2* 8.8*  HCT 27.9* 27.4*  PLT 380 408*   BMET:  Recent Labs    02/26/22 0445 02/27/22 0304  NA 139 137  K 4.0 4.4  CL 102 101  CO2 26 26  GLUCOSE 121* 93  BUN 20 18  CREATININE 1.42* 1.36*  CALCIUM 8.8* 8.9    PT/INR: No results for input(s): "LABPROT", "INR" in the last 72 hours. ABG    Component Value Date/Time   PHART 7.344 (L) 02/20/2022 0638   HCO3 19.7 (L) 02/20/2022 0638   TCO2 21 (L) 02/20/2022 0638   ACIDBASEDEF 5.0 (H) 02/20/2022 0638   O2SAT 44.8 02/23/2022 0330   CBG (last 3)  Recent Labs    02/26/22 0820 02/26/22 1130 02/26/22 1611  GLUCAP 122* 88 107*    Meds Scheduled Meds:  aspirin EC  325 mg Oral Daily   Chlorhexidine Gluconate Cloth  6 each Topical Daily    clopidogrel  75 mg Oral Daily   dapagliflozin propanediol  10 mg Oral Daily   docusate sodium  100 mg Oral Daily   docusate sodium  200 mg Oral Daily   furosemide  40 mg Oral Daily   gabapentin  200 mg Oral BID   heparin injection (subcutaneous)  5,000 Units Subcutaneous Q8H   lidocaine  2 patch Transdermal Q24H   losartan  25 mg Oral Daily   metoprolol succinate  25 mg Oral Daily   pantoprazole  40 mg Oral Daily   rosuvastatin  40 mg Oral Daily   sodium chloride flush  3 mL Intravenous Q12H   spironolactone  25 mg Oral Daily   Continuous Infusions:  sodium chloride     PRN Meds:.sodium chloride, acetaminophen, alum & mag hydroxide-simeth, ipratropium-albuterol, magnesium hydroxide, ondansetron **OR** ondansetron (ZOFRAN) IV, mouth rinse, mouth rinse, oxyCODONE, sodium chloride flush, traMADol  Xrays DG Chest 2 View  Result Date: 02/27/2022 CLINICAL DATA:  47 year old male status post CABG postoperative day 8. EXAM: CHEST - 2 VIEW COMPARISON:  Portable chest 02/24/2022 and earlier. FINDINGS: PA and lateral views at 0445 hours. Right IJ introducer sheath has been removed. Stable median sternotomy wires and left atrial appendage region clip. Small bilateral pleural effusions. Streaky perihilar atelectasis mostly on the left. No pneumothorax or  pulmonary edema. Stable cardiac size and mediastinal contours. No acute osseous abnormality identified. Negative visible bowel gas. IMPRESSION: 1. Right IJ introducer sheath removed. 2. Small bilateral pleural effusions and atelectasis. No pneumothorax or pulmonary edema. Electronically Signed   By: Genevie Ann M.D.   On: 02/27/2022 06:31    Assessment/Plan: S/P Procedure(s) (LRB): CORONARY ARTERY BYPASS GRAFTING (CABG) X THREE, USING ENDOSCOPICALLY HARVESTED RIGHT GREATER SAPHENOUS VEIN  AND LEFT ATRIAL APPENDAGE CLIPPING (N/A) POD#9  1 afeb, s BP 110's-140's- improved on ARB, sinus rhythm 2 sats good on RA 3 good UOP 4 no new labs or CXR's 5 cont  pulm hygiene and rehab 6 close to d/c when all agree, may be worth checking WBC one more time as was rising( 16 K yesterday) - chest incision without further drainage or erethema 7 transfer to 4 e somehow cancelled?- will re-order   LOS: 11 days  Addendum: Discussed with Dr. Tenny Craw and he may want to recheck white blood cell count.  This was 14.9 differential showed neutrophilia with somewhat of a left shift.  Plan to keep in the hospital for now with potential need for further testing including possible scan.  John Giovanni PA-C Pager [phone] 02/28/2022

## 2022-02-28 NOTE — Progress Notes (Signed)
Patient ID: Logan Crawford, male   DOB: [birth year], 47 y.o.   MRN: [id number]  TCTS Evening Rounds:  Hemodynamically stable   Tmax 99.3 today.  Sats 97% RA  UO ok.  Ambulating.

## 2022-03-01 DIAGNOSIS — Z951 Presence of aortocoronary bypass graft: Secondary | ICD-10-CM

## 2022-03-01 LAB — CBC
HCT: 30.4 % — ABNORMAL LOW (ref 39.0–52.0)
Hemoglobin: 10.3 g/dL — ABNORMAL LOW (ref 13.0–17.0)
MCH: 30.6 pg (ref 26.0–34.0)
MCHC: 33.9 g/dL (ref 30.0–36.0)
MCV: 90.2 fL (ref 80.0–100.0)
Platelets: 426 10*3/uL — ABNORMAL HIGH (ref 150–400)
RBC: 3.37 MIL/uL — ABNORMAL LOW (ref 4.22–5.81)
RDW: 14.4 % (ref 11.5–15.5)
WBC: 14.9 10*3/uL — ABNORMAL HIGH (ref 4.0–10.5)
nRBC: 0 % (ref 0.0–0.2)

## 2022-03-01 LAB — BASIC METABOLIC PANEL
Anion gap: 11 (ref 5–15)
BUN: 23 mg/dL — ABNORMAL HIGH (ref 6–20)
CO2: 22 mmol/L (ref 22–32)
Calcium: 8.8 mg/dL — ABNORMAL LOW (ref 8.9–10.3)
Chloride: 99 mmol/L (ref 98–111)
Creatinine, Ser: 1.52 mg/dL — ABNORMAL HIGH (ref 0.61–1.24)
GFR, Estimated: 56 mL/min — ABNORMAL LOW (ref >60)
Glucose, Bld: 134 mg/dL — ABNORMAL HIGH (ref 70–99)
Potassium: 4.3 mmol/L (ref 3.5–5.1)
Sodium: 132 mmol/L — ABNORMAL LOW (ref 135–145)

## 2022-03-01 MED ORDER — LOSARTAN POTASSIUM 50 MG PO TABS: 50.0000 mg | ORAL_TABLET | Freq: Every day | ORAL | 2 refills | Status: DC

## 2022-03-01 MED ORDER — ROSUVASTATIN CALCIUM 40 MG PO TABS: 40.0000 mg | ORAL_TABLET | Freq: Every day | ORAL | 2 refills | Status: DC

## 2022-03-01 MED ORDER — DAPAGLIFLOZIN PROPANEDIOL 10 MG PO TABS: 10.0000 mg | ORAL_TABLET | Freq: Every day | ORAL | 2 refills | Status: DC

## 2022-03-01 MED ORDER — INFLUENZA VAC SPLIT QUAD 0.5 ML IM SUSY
0.5000 mL | PREFILLED_SYRINGE | INTRAMUSCULAR | Status: AC | PRN
  Administered 2022-03-01: 0.5 mL via INTRAMUSCULAR
  Filled 2022-03-01 (×3): qty 0.5

## 2022-03-01 MED ORDER — METOPROLOL SUCCINATE ER 25 MG PO TB24: 25.0000 mg | ORAL_TABLET | Freq: Every day | ORAL | 2 refills | Status: DC

## 2022-03-01 MED ORDER — CLOPIDOGREL BISULFATE 75 MG PO TABS: 75.0000 mg | ORAL_TABLET | Freq: Every day | ORAL | 2 refills | Status: DC

## 2022-03-01 MED ORDER — OXYCODONE HCL 5 MG PO TABS: 5.0000 mg | ORAL_TABLET | ORAL | 0 refills | Status: AC | PRN

## 2022-03-01 MED ORDER — GABAPENTIN 100 MG PO CAPS: 200.0000 mg | ORAL_CAPSULE | Freq: Two times a day (BID) | ORAL | 1 refills | Status: DC

## 2022-03-01 MED ORDER — LOSARTAN POTASSIUM 50 MG PO TABS
50.0000 mg | ORAL_TABLET | Freq: Every day | ORAL | Status: DC
  Administered 2022-03-01: 50 mg via ORAL
  Filled 2022-03-01: qty 1

## 2022-03-01 MED ORDER — SPIRONOLACTONE 25 MG PO TABS: 25.0000 mg | ORAL_TABLET | Freq: Every day | ORAL | 2 refills | Status: DC

## 2022-03-01 NOTE — Progress Notes (Signed)
Patient ID: Logan Crawford, male   DOB: Dec 08, 1975, 47 y.o.   MRN: [id number]   Cardiology Rounding Note  PCP-Cardiologist: Kate Sable, MD   Subjective:    Echo (12/29): EF 50-55%, RV normal, mild MR, mild AI.   White count stable, though elevated, this AM. Weight at baseline. He feels much better today, no significant coughing. Able to take a shower for about 30 minutes today. No fevers. Uncle who lives with him had flu, but a friend has come over and disinfected his house. He is also amenable to a flu shot before he leaves.  Objective:   Weight Range: 100.7 kg Body mass index is 30.11 kg/m.   Vital Signs:   Temp:  [98.1 F (36.7 C)-99.4 F (37.4 C)] 98.1 F (36.7 C) (01/06 0745) Pulse Rate:  [70-86] 73 (01/06 0800) Resp:  [12-20] 17 (01/06 0800) BP: (116-148)/(63-90) 143/90 (01/06 0400) SpO2:  [91 %-97 %] 94 % (01/06 0800) Weight:  [100.7 kg] 100.7 kg (01/06 0418) Last BM Date : 02/28/22  Weight change: Filed Weights   02/27/22 0436 02/28/22 0500 03/01/22 0418  Weight: 101.4 kg 100.9 kg 100.7 kg    Intake/Output:   Intake/Output Summary (Last 24 hours) at 03/01/2022 0926 Last data filed at 03/01/2022 0754 Gross per 24 hour  Intake 1170 ml  Output 2100 ml  Net -930 ml    Physical Exam   GEN: Well nourished, well developed in no acute distress HEENT: Normal, moist mucous membranes NECK: No JVD CARDIAC: regular rhythm, normal S1 and S2, no rubs or gallops. No murmur. Midline sternal incision c/d/I, with stitches in place in lower section VASCULAR: Radial and DP pulses 2+ bilaterally. No carotid bruits RESPIRATORY:  Clear to auscultation without rales, wheezing or rhonchi  ABDOMEN: Soft, non-tender, non-distended MUSCULOSKELETAL:  Ambulates independently SKIN: Warm and dry, no edema NEUROLOGIC:  Alert and oriented x 3. No focal neuro deficits noted. PSYCHIATRIC:  Normal affect    Telemetry   NSR  Labs    CBC Recent Labs    02/28/22 0822  03/01/22 0713  WBC 14.9* 14.9*  NEUTROABS 10.7*  --   HGB 9.6* 10.3*  HCT 28.6* 30.4*  MCV 90.8 90.2  PLT 432* 924*   Basic Metabolic Panel Recent Labs    02/28/22 0852 03/01/22 0713  NA 135 132*  K 4.3 4.3  CL 99 99  CO2 28 22  GLUCOSE 117* 134*  BUN 22* 23*  CREATININE 1.38* 1.52*  CALCIUM 9.0 8.8*   Liver Function Tests No results for input(s): "AST", "ALT", "ALKPHOS", "BILITOT", "PROT", "ALBUMIN" in the last 72 hours.  No results for input(s): "LIPASE", "AMYLASE" in the last 72 hours. Cardiac Enzymes No results for input(s): "CKTOTAL", "CKMB", "CKMBINDEX", "TROPONINI" in the last 72 hours.   BNP: BNP (last 3 results) Recent Labs    06/27/21 1707 11/17/21 0043  BNP 18.9 23.6    ProBNP (last 3 results) No results for input(s): "PROBNP" in the last 8760 hours.   D-Dimer No results for input(s): "DDIMER" in the last 72 hours. Hemoglobin A1C No results for input(s): "HGBA1C" in the last 72 hours. Fasting Lipid Panel No results for input(s): "CHOL", "HDL", "LDLCALC", "TRIG", "CHOLHDL", "LDLDIRECT" in the last 72 hours. Thyroid Function Tests No results for input(s): "TSH", "T4TOTAL", "T3FREE", "THYROIDAB" in the last 72 hours.  Invalid input(s): "FREET3"  Other results:   Imaging    No results found.   Medications:     Scheduled Medications:  aspirin EC  325 mg Oral Daily   Chlorhexidine Gluconate Cloth  6 each Topical Daily   clopidogrel  75 mg Oral Daily   dapagliflozin propanediol  10 mg Oral Daily   docusate sodium  200 mg Oral Daily   gabapentin  200 mg Oral BID   heparin injection (subcutaneous)  5,000 Units Subcutaneous Q8H   lidocaine  2 patch Transdermal Q24H   losartan  50 mg Oral Daily   metoprolol succinate  25 mg Oral Daily   pantoprazole  40 mg Oral Daily   rosuvastatin  40 mg Oral Daily   sodium chloride flush  3 mL Intravenous Q12H   spironolactone  25 mg Oral Daily    Infusions:  sodium chloride      PRN  Medications: sodium chloride, acetaminophen, alum & mag hydroxide-simeth, influenza vac split quadrivalent PF, ipratropium-albuterol, magnesium hydroxide, ondansetron **OR** ondansetron (ZOFRAN) IV, mouth rinse, mouth rinse, oxyCODONE, sodium chloride flush, traMADol   Assessment/Plan    1. Cardiogenic shock, post cardiotomy - S/p CABG 12/27. CHB and severe hyperkalemia prior to placement on cardiopulmonary bypass. Placed on CPB urgently.  - PreCABG echo EF 55% - Echo 12/29 with EF 50-55%, mild AI, mild MR.  - IABP out 12/29.  - Volume status stable, he is at his baseline weight. Can continue Lasix 40 mg daily until follow up - Continue 25 mg daily Toprol.  - Continue losartan 25 mg daily  - Continue spironolactone to 25 mg daily.  - Continue Farxiga 10 mg daily.     2. CAD with NSTEMI -Hx prior PCI/stent to LAD in 2021 and PCI/stent to LAD in 03/23 d/t in-stent restenosis -NSTEMI this admit. S/p CABG X 3 (LIMA to Diag, SVG to LAD, SVG to RCA) -Aspirin + statin, from cardiac standpoint ok to drop aspirin to 81 mg as he is on DAPT -Admitted with NSTEMI, he is now on Plavix.   3. AKI on CKD -Baseline Scr 1.1-1.3, 1.6 pre-op -Cr up to 2.5, now trending down 1.38 today.    4. Severe Hyperkalemia--> now hypokalemia.  -Etiology uncertain -Occurred prior to initation CPB. Received dantrolene. Some initial concern for malignant hyperthermia.  K 4.3 today.    5. CHB -Occurred prior to placement on CPB, in setting of hyperkalemia -Now in NSR without pauses/bradycardia noted.    6. Polysubstance abuse -Tobacco and cocaine -UDS + for cocaine on admit  7. Upper sternal drainage - Afebrile, WBCs 12.8 ---> 16 --> 14.9 -> 14.9 - Completed course of Cefazolin.    8. SDOH: -Hx noncompliance and substance abuse -Uninsured. Will need to engage TOC CSW/CM.   Ok for discharge from cardiac perspective, has follow up scheduled with Dr. Gala Romney on 03/07/22  Jodelle Red 03/01/2022 9:26 AM

## 2022-03-01 NOTE — Progress Notes (Signed)
Pt education completed to include future appointments, current prescriptions and medications, and doctors discharge instructions. All lines removed. Pt satisfied all belongings returned. Pt alert and oriented, vital signs stable. Pt discharged with nursing staff via wheel chair. Temp: 98.1 F (36.7 C) (01/06 0745) Temp Source: Axillary (01/06 0745) BP: 111/66 (01/06 0950) Pulse Rate: 72 (01/06 0950)  Wray Kearns 03/01/2022 1:29 PM

## 2022-03-01 NOTE — TOC Transition Note (Signed)
Transition of Care [facility]) - CM/SW Discharge Note   Patient Details  Name: Logan Crawford MRN: [id number] Date of Birth: 1975-12-24  [facility] CM/SW Contact:  Carles Collet, RN Phone Number: 03/01/2022, 10:34 AM   Clinical Narrative:     Folloed by HF CM during admission.  Provided with MATCH and [place] coupon.     Barriers to Discharge: Continued Medical Work up   Patient Goals and CMS Choice CMS Medicare.gov Compare Post Acute Care list provided to:: Patient Represenative (must comment) (Mother)    Discharge Placement                         Discharge Plan and Services Additional resources added to the After Visit Summary for     Discharge Planning Services: CM Consult                                 Social Determinants of Health (SDOH) Interventions SDOH Screenings   Food Insecurity: No Food Insecurity (02/17/2022)  Housing: Low Risk  (02/17/2022)  Transportation Needs: No Transportation Needs (02/17/2022)  Utilities: Not At Risk (02/17/2022)  Tobacco Use: Medium Risk (02/20/2022)     Readmission Risk Interventions     No data to display

## 2022-03-01 NOTE — Progress Notes (Signed)
10 Days Post-Op Procedure(s) (LRB): CORONARY ARTERY BYPASS GRAFTING (CABG) X THREE, USING ENDOSCOPICALLY HARVESTED RIGHT GREATER SAPHENOUS VEIN  AND LEFT ATRIAL APPENDAGE CLIPPING (N/A) Subjective: He feels well. Ambulated around the ICU this am. Pain under good control.  Objective: Vital signs in last 24 hours: Temp:  [98.1 F (36.7 C)-99.4 F (37.4 C)] 98.1 F (36.7 C) (01/06 0745) Pulse Rate:  [70-86] 73 (01/06 0800) Cardiac Rhythm: Normal sinus rhythm (01/06 0800) Resp:  [12-20] 17 (01/06 0800) BP: (116-148)/(63-90) 143/90 (01/06 0400) SpO2:  [91 %-97 %] 94 % (01/06 0800) Weight:  [100.7 kg] 100.7 kg (01/06 0418)  Hemodynamic parameters for last 24 hours:    Intake/Output from previous day: 01/05 0701 - 01/06 0700 In: 1170 [P.O.:1170] Out: 2100 [Urine:2100] Intake/Output this shift: Total I/O In: 240 [P.O.:240] Out: -   General appearance: alert and cooperative Neurologic: intact Heart: regular rate and rhythm, S1, S2 normal, no murmur Lungs: clear to auscultation bilaterally Abdomen: soft, non-tender; bowel sounds normal Extremities: no edema Wound: chest and leg incisions look ok with no signs of infection, no drainage.  Lab Results: Recent Labs    02/28/22 0822 03/01/22 0713  WBC 14.9* 14.9*  HGB 9.6* 10.3*  HCT 28.6* 30.4*  PLT 432* 426*   BMET:  Recent Labs    02/28/22 0852 03/01/22 0713  NA 135 132*  K 4.3 4.3  CL 99 99  CO2 28 22  GLUCOSE 117* 134*  BUN 22* 23*  CREATININE 1.38* 1.52*  CALCIUM 9.0 8.8*    PT/INR: No results for input(s): "LABPROT", "INR" in the last 72 hours. ABG    Component Value Date/Time   PHART 7.344 (L) 02/20/2022 0638   HCO3 19.7 (L) 02/20/2022 0638   TCO2 21 (L) 02/20/2022 0638   ACIDBASEDEF 5.0 (H) 02/20/2022 0638   O2SAT 44.8 02/23/2022 0330   CBG (last 3)  Recent Labs    02/26/22 1130 02/26/22 1611  GLUCAP 88 107*    Assessment/Plan: S/P Procedure(s) (LRB): CORONARY ARTERY BYPASS GRAFTING (CABG) X  THREE, USING ENDOSCOPICALLY HARVESTED RIGHT GREATER SAPHENOUS VEIN  AND LEFT ATRIAL APPENDAGE CLIPPING (N/A)  POD 10  Hemodynamically stable in sinus rhythm on Toprol.  Remains afebrile with WBC stable at 14.9 which is not unusual and may be inflammatory.  No signs of wound infection or pneumonia.   Creat bumped slightly from yesterday to 1.52 but about in his normal range. Wt is at preop so can stop lasix. He was started on Cozaar which could affect creat.   Flu shot while he is here.  Continue IS, ambulation.  If ok with AHF team I think he could go home today with followup in our office in a week. He still has sutures in lower part of chest incision which will need to be taken out in the office.   LOS: 12 days    Gaye Pollack 03/01/2022

## 2022-03-01 NOTE — Progress Notes (Signed)
NAME:  Logan Crawford, MRN:  [id number], DOB:  January 05, 1976, LOS: 12 ADMISSION DATE:  02/17/2022, CONSULTATION DATE:  12/27 REFERRING MD:  12/27, CHIEF COMPLAINT:  post-op CABG  History of Present Illness:  46 yom w/ hx as outlined below had known 2V CAD presented to ER 12/21 w/ CP c/w prior NSTEMI. Left Heart cath showed re-stenosis of LAD (felt 2/2 non-compliance w DAPT and cocaine abuse). Also found to have sig diseased RCA. Presents to the CVICU 12/27 s/p CABG X 3V w/ placement of IABP, had brief CHB intraoperative w/ potassium noted around 6.8 and pt placed on bypass urgently  PCCM asked to assist w/ post op care   Pertinent  Medical History  CAD . 11/2019 NSTEMI/PCI: LM min irregs, LAD 80m (2.75x22 Resolute Onyx DES)  HFpEF 60-65% HTN, HL, tobacco abuse  Polysubstance abuse.  Significant Hospital Events: Including procedures, antibiotic start and stop dates in addition to other pertinent events   12/21 admitted. Left heart cath: Progression of Severe Multivessel: - 2 potential culprit lesions: distal-mid RCA ulcerated 95% ruptured plaque (mid #2), distal mid LAD stent edge 99% with diffuse disease elsewhere RCA:-proximal #1 70%, followed by proximal #2 70%, mid #1 1 70% and mid #2 95%, ostial PDA 60% LAD: Long stented area with proximal concentric 70% are, jailed D2 85% Zio, distal stent focal 80% followed by segmental 50% then 99% at distal edge. LCx-small OM1 and OM 2 with bifurcating OM 3, AVG circumflex with small branches  12/26 ECHO:  1. Small area of distal septal/inferior basal hypokinesis . Left  ventricular ejection fraction, by estimation, is 55%. The left ventricle  has normal function. The left ventricle has no regional wall motion  abnormalities. There is mild left ventricular hypertrophy. Left ventricular diastolic parameters are consistent with  Grade I diastolic dysfunction (impaired relaxation).  Right ventricular systolic function is normal.  78/29 3 V CABG and  placement of IABP by Enter. Intra-op received 2 units PRBC. Consulted Nephro, 1uPRBC overnight 12/28 - SIMV/PS received 1 unit of blood 12/29 IABP removed and extubated. Potassium has corrected, renal failure is improving.  Extubated  Interim History / Subjective:  Remain afebrile Walking around the unit Stated appetite is good, had bowel movement last  Objective   Blood pressure (!) 143/90, pulse 70, temperature 98.1 F (36.7 C), temperature source Axillary, resp. rate 14, height 6' (1.829 m), weight 100.7 kg, SpO2 92 %.        Intake/Output Summary (Last 24 hours) at 03/01/2022 0757 Last data filed at 03/01/2022 0754 Gross per 24 hour  Intake 1410 ml  Output 2100 ml  Net -690 ml   Filed Weights   02/27/22 0436 02/28/22 0500 03/01/22 0418  Weight: 101.4 kg 100.9 kg 100.7 kg   Physical Examination: Physical exam: General: Middle-age male, lying on the bed HEENT: [place]/AT, eyes anicteric.  moist mucus membranes Neuro: Alert, awake following commands Chest: Central chest wound looks clean and dry, coarse breath sounds, no wheezes or rhonchi Heart: Regular rate and rhythm, no murmurs or gallops Abdomen: Soft, nontender, nondistended, bowel sounds present Skin: No rash   Assessment & Plan:  Acute NSTEMI w/ Severe multivessel CAD, now s/p CABG  x 312/27 w/ post-operative cardiogenic shock requiring IABP and inotropic support IABP and inotropes were discontinued Continue Toprol, increase losartan to 50 mg daily Continue spiro and farxiga Continue aspirin, Plavix and statin  Continue Lasix p.o.  HTN Blood pressure is slightly elevated Increase Coreg to 50 mg once daily  Continue Toprol  AKI on CKD stage IIIa Serum creatinine is back to baseline Monitor intake and output Avoid nephrotoxic agents  Expected post-op anemia H&H is stable  Secondary cardiac prevention Tobacco dependence and poly-substance abuse Cessation counseling provided Currently on aspirin, Plavix and  statin  Best Practice (right click and "Reselect all SmartList Selections" daily)   Diet/type: Regular consistency DVT prophylaxis: heparin GI prophylaxis: H2B Lines: N/A Foley:  N/A Code Status:  full code Last date of multidisciplinary goals of care discussion: Per primary team

## 2022-03-03 ENCOUNTER — Other Ambulatory Visit: Payer: Self-pay | Admitting: *Deleted

## 2022-03-03 ENCOUNTER — Other Ambulatory Visit: Payer: Self-pay

## 2022-03-03 MED ORDER — NITROGLYCERIN 0.4 MG SL SUBL: 0.4000 mg | SUBLINGUAL_TABLET | SUBLINGUAL | 0 refills | Status: DC | PRN

## 2022-03-05 ENCOUNTER — Other Ambulatory Visit: Payer: Self-pay | Admitting: Cardiothoracic Surgery

## 2022-03-05 DIAGNOSIS — Z951 Presence of aortocoronary bypass graft: Secondary | ICD-10-CM

## 2022-03-06 ENCOUNTER — Ambulatory Visit
Admission: RE | Admit: 2022-03-06 | Discharge: 2022-03-06 | Disposition: A | Discharge status: Home / Self Care | Payer: Self-pay | Source: Ambulatory Visit | Attending: Cardiothoracic Surgery | Admitting: Cardiothoracic Surgery

## 2022-03-06 ENCOUNTER — Ambulatory Visit (INDEPENDENT_AMBULATORY_CARE_PROVIDER_SITE_OTHER): Payer: Self-pay | Admitting: Cardiothoracic Surgery

## 2022-03-06 ENCOUNTER — Other Ambulatory Visit: Payer: Self-pay | Admitting: Cardiothoracic Surgery

## 2022-03-06 VITALS — BP 118/80 | HR 80 | Resp 20 | Wt 215.0 lb

## 2022-03-06 DIAGNOSIS — Z951 Presence of aortocoronary bypass graft: Secondary | ICD-10-CM

## 2022-03-06 NOTE — Progress Notes (Signed)
S/P CAB / LAA closure 02/19/22  No sig complaints Reports difficult to get out of bed with restrictions, fine once out of bed  Reports that he is taking ASA and plavix as Rx  NAD Mildly laboured resp Chest incision c/d/I - mild erythema laterally around retention sutures - all removed. No sig LE edema at all  Didn't go for XR today ?why  A/P  S/P CAB / LAA closure 02/19/22  Obtain 2v XR as ordered  Seeing HF tomorrow  See me back with PA in 3 weeks  Have told him multiple times that he had an operation that had refractory hyperkalemia with unexplained cause. Occurred prior to use of cardiopulmonary bypass. He received dantrolene associated with this.  If he is evaluated for an anesthetic in the past he needs to tell them he has had an operation with unexplained severe hyperkalemia or "high potassium"  We will discuss at repeat visit if a muscle biopsy is appropriate.

## 2022-03-07 ENCOUNTER — Other Ambulatory Visit
Admission: RE | Admit: 2022-03-07 | Discharge: 2022-03-07 | Disposition: A | Discharge status: Home / Self Care | Payer: Medicaid Other | Source: Ambulatory Visit | Attending: Internal Medicine | Admitting: Internal Medicine

## 2022-03-07 ENCOUNTER — Ambulatory Visit (HOSPITAL_BASED_OUTPATIENT_CLINIC_OR_DEPARTMENT_OTHER): Payer: Medicaid Other | Admitting: Internal Medicine

## 2022-03-07 VITALS — BP 112/64 | HR 82 | Wt 216.0 lb

## 2022-03-07 DIAGNOSIS — I5032 Chronic diastolic (congestive) heart failure: Secondary | ICD-10-CM | POA: Insufficient documentation

## 2022-03-07 DIAGNOSIS — I25118 Atherosclerotic heart disease of native coronary artery with other forms of angina pectoris: Secondary | ICD-10-CM

## 2022-03-07 DIAGNOSIS — J90 Pleural effusion, not elsewhere classified: Secondary | ICD-10-CM | POA: Insufficient documentation

## 2022-03-07 LAB — BASIC METABOLIC PANEL
Anion gap: 10 (ref 5–15)
BUN: 28 mg/dL — ABNORMAL HIGH (ref 6–20)
CO2: 21 mmol/L — ABNORMAL LOW (ref 22–32)
Calcium: 11 mg/dL — ABNORMAL HIGH (ref 8.9–10.3)
Chloride: 103 mmol/L (ref 98–111)
Creatinine, Ser: 1.72 mg/dL — ABNORMAL HIGH (ref 0.61–1.24)
GFR, Estimated: 48 mL/min — ABNORMAL LOW (ref >60)
Glucose, Bld: 114 mg/dL — ABNORMAL HIGH (ref 70–99)
Potassium: 5.1 mmol/L (ref 3.5–5.1)
Sodium: 134 mmol/L — ABNORMAL LOW (ref 135–145)

## 2022-03-07 LAB — CBC
HCT: 39.1 % (ref 39.0–52.0)
Hemoglobin: 12.3 g/dL — ABNORMAL LOW (ref 13.0–17.0)
MCH: 28.2 pg (ref 26.0–34.0)
MCHC: 31.5 g/dL (ref 30.0–36.0)
MCV: 89.7 fL (ref 80.0–100.0)
Platelets: 883 10*3/uL — ABNORMAL HIGH (ref 150–400)
RBC: 4.36 MIL/uL (ref 4.22–5.81)
RDW: 13.8 % (ref 11.5–15.5)
WBC: 13.5 10*3/uL — ABNORMAL HIGH (ref 4.0–10.5)
nRBC: 0 % (ref 0.0–0.2)

## 2022-03-07 LAB — BRAIN NATRIURETIC PEPTIDE: B Natriuretic Peptide: 103.5 pg/mL — ABNORMAL HIGH (ref 0.0–100.0)

## 2022-03-07 LAB — SEDIMENTATION RATE: Sed Rate: 79 mm/hr — ABNORMAL HIGH (ref 0–15)

## 2022-03-07 MED ORDER — FUROSEMIDE 40 MG PO TABS: 40.0000 mg | ORAL_TABLET | Freq: Every day | ORAL | 3 refills | Status: DC

## 2022-03-07 NOTE — Progress Notes (Signed)
Advanced Heart Failure Clinic Note   Referring Physician: Dr. Azucena Cecil PCP: Pcp, No PCP-Cardiologist: Debbe Odea, MD   HPI:  Logan Crawford is a 47 y.o. male with HTN, polysubstance abuse, tobacco use disorder, CAD s/p PCI in 04/2021, CABG in 12/23.   Hospitalized from 9/23 with NSTEMI. Cath on 9/25 showing significant two-vessel coronary artery disease which was  treated medically and on DAPT. Readmitted 12/23 with NSTEMI.  Cath 02/13/22 which showed recurrent in-stent restenosis in the LAD in the setting of continued cocaine use and poor compliance with antiplatelet medications.  Patient also has significant progression of the RCA disease.  Pre-op EF normal.   Underwent CAG 02/19/22  (LIMA - Diag, SVG - LAD, SVG - RCA). Operative course complicated by severe hyperkalemia (prior to cardiopulmonary bypass with question of malignant hyperthermia syndrome - received dantrolene). Early post-op course was notable for shock physiology and AKI. Required IABP and multiple gtts which were eventually weaned ECHO EF 55-60%. Also had some mild sternal drainage treated with abx   Discharged 03/01/22. Saw Dr. Delia Chimes yesterday. CXR with large left effusion  Has never really felt well since leaving the hospital. Says he has had progressive SOB. Now SOB with almost any activity and at rest. Having constant pain between shoulder blades and says Oxycodone doesn't do it.  +orthopnea/PND. + abdominal bloating. No LE edema. Taking meds routinely. Not weighing. Says he is having chills daily. No sternal drainage      Past Medical History:  Diagnosis Date   CAD (coronary artery disease)    a. 11/2019 NSTEMI/PCI: LM min irregs, LAD 8m (2.75x22 Resolute Onyx DES), 40d, D2 min irregs, LCX/LPAV min irregs, RCA 60ost, 40p/m, EF 45-50%; b. 11/2020 MV: EF 43%, small area of apical ischemia ->? over processing-->low risk.   Hyperlipidemia    Hypertension    Ischemic cardiomyopathy    a. 11/2019 LV Gram: EF 45-50%;  b. 12/2019 Echo: EF 50% w/ mod mid-apical anteroseptal HK, GrI DD, nl RV fxn, mild Ao sclerosis.   Tobacco use 11/25/2019    Current Outpatient Medications  Medication Sig Dispense Refill   albuterol (VENTOLIN HFA) 108 (90 Base) MCG/ACT inhaler Inhale 2 puffs into the lungs every 6 (six) hours as needed for wheezing or shortness of breath. 6.7 g 0   aspirin EC 81 MG tablet Take 1 tablet (81 mg total) by mouth daily. Swallow whole. 30 tablet 0   clopidogrel (PLAVIX) 75 MG tablet Take 1 tablet (75 mg total) by mouth daily. 30 tablet 2   dapagliflozin propanediol (FARXIGA) 10 MG TABS tablet Take 1 tablet (10 mg total) by mouth daily. 30 tablet 2   gabapentin (NEURONTIN) 100 MG capsule Take 2 capsules (200 mg total) by mouth 2 (two) times daily. 30 capsule 1   losartan (COZAAR) 50 MG tablet Take 1 tablet (50 mg total) by mouth daily. 30 tablet 2   metoprolol succinate (TOPROL-XL) 25 MG 24 hr tablet Take 1 tablet (25 mg total) by mouth daily. 30 tablet 2   nitroGLYCERIN (NITROSTAT) 0.4 MG SL tablet Place 1 tablet (0.4 mg total) under the tongue every 5 (five) minutes as needed for chest pain (if chest pain not resolved after 3 doses, please call 911). 25 tablet 0   oxyCODONE (OXY IR/ROXICODONE) 5 MG immediate release tablet Take 1 tablet (5 mg total) by mouth every 4 (four) hours as needed for up to 7 days for severe pain. 30 tablet 0   rosuvastatin (CRESTOR) 40 MG tablet Take 1  tablet (40 mg total) by mouth daily. 30 tablet 2   spironolactone (ALDACTONE) 25 MG tablet Take 1 tablet (25 mg total) by mouth daily. 30 tablet 2   No current facility-administered medications for this visit.    No Known Allergies    Social History   Socioeconomic History   Marital status: Single    Spouse name: Not on file   Number of children: Not on file   Years of education: Not on file   Highest education level: Not on file  Occupational History   Not on file  Tobacco Use   Smoking status: Former     Packs/day: 1.00    Years: 22.00    Total pack years: 22.00    Types: Cigarettes    Quit date: 01/23/2020    Years since quitting: 2.1   Smokeless tobacco: Never   Tobacco comments:    Smokes 1-2 cigarettes daily    Vaping Use   Vaping Use: Never used  Substance and Sexual Activity   Alcohol use: Yes    Alcohol/week: 1.0 standard drink of alcohol    Types: 1 Shots of liquor per week    Comment: socially   Drug use: No   Sexual activity: Not on file  Other Topics Concern   Not on file  Social History Narrative   Not on file   Social Determinants of Health   Financial Resource Strain: Not on file  Food Insecurity: No Food Insecurity (02/17/2022)   Hunger Vital Sign    Worried About Running Out of Food in the Last Year: Never true    Ran Out of Food in the Last Year: Never true  Transportation Needs: No Transportation Needs (02/17/2022)   PRAPARE - Hydrologist (Medical): No    Lack of Transportation (Non-Medical): No  Physical Activity: Not on file  Stress: Not on file  Social Connections: Not on file  Intimate Partner Violence: Not At Risk (02/17/2022)   Humiliation, Afraid, Rape, and Kick questionnaire    Fear of Current or Ex-Partner: No    Emotionally Abused: No    Physically Abused: No    Sexually Abused: No      Family History  Problem Relation Age of Onset   Coronary artery disease Mother    Arrhythmia Mother        Pacemaker placed in 2021   Heart failure Mother    Coronary artery disease Father     Vitals:   03/07/22 1114  BP: 112/64  Pulse: 82  SpO2: 100%  Weight: 216 lb (98 kg)   Wt Readings from Last 3 Encounters:  03/07/22 216 lb (98 kg)  03/06/22 215 lb (97.5 kg)  03/01/22 222 lb 0.1 oz (100.7 kg)      PHYSICAL EXAM: General:  Sitting in chair. Breathing heavily  HEENT: normal Neck: supple. no obvious JVD. Carotids 2+ bilat; no bruits. No lymphadenopathy or thyromegaly appreciated. Cor: Sternal wound ok  RRR. Dull 1/2 up on L  Lungs: clear Abdomen: soft, nontender, nondistended. No hepatosplenomegaly. No bruits or masses. Good bowel sounds. Extremities: no cyanosis, clubbing, rash, edema Neuro: alert & oriented x 3, cranial nerves grossly intact. moves all 4 extremities w/o difficulty. Affect pleasant.  ECG: NSR 85 nonspecific ST-T abnormalities    ASSESSMENT & PLAN:  1. Chrnic diastolic HF - S/p CABG 63/01.  - Echo 12/29 with EF 50-55%, mild AI, mild MR.  - Bedside echo today 03/07/22 in clinic LVEF 60%  RV ok. No pericardial effusion - Symptoms seem out of proportion to exam. However does have left pleural effusion and REDS is 48% - Will give dose of Furoscix and start lasix 40 daily. - Refer to IR for left thoracentesis  - Continue 25 mg daily Toprol.  - Continue losartan 25 mg daily  - Continue spironolactone to 25 mg daily.  - Continue Farxiga 10 mg daily.   - Labs today - F/u next week to reassess   2. CAD with NSTEMI -Hx prior PCI/stent to LAD in 2021 and PCI/stent to LAD in 03/23 d/t in-stent restenosis -NSTEMI 12/23 S/p CABG X 3 on 02/19/22 (LIMA to Diag, SVG to LAD, SVG to RCA) -no s/s angina. Continue DAPT   3. CKD 3a -Baseline Scr 1.3-1.6 - labs today   4. Polysubstance abuse -Tobacco and cocaine -UDS + for cocaine on admit   5. Upper sternal drainage - Resolved.  - Given chills will check CBC and ESR  6. left pleural effusion - reviewed with Dr. Tenny Craw.  = refer to IR for drainage   7. SDOH: -Hx noncompliance and substance abuse -Uninsured. Will need to engage TOC CSW/CM.   Total time spent 45 minutes. Over half that time spent discussing above.   Logan Bickers, MD 03/07/22

## 2022-03-07 NOTE — Progress Notes (Signed)
ReDS Vest / Clip - 03/07/22 1200       ReDS Vest / Clip   Station Marker C    ReDS Actual Value 48

## 2022-03-07 NOTE — Patient Instructions (Signed)
Medication Changes:  Use Furoscix Kit TODAY  Sat 03/08/21 START Furosemide 40 mg Daily  Lab Work:  Labs done today, your results will be available in MyChart, we will contact you for abnormal readings.   Testing/Procedures:  Thoracentesis, we will call you to schedule this  Referrals:  none  Special Instructions // Education:  Do the following things EVERYDAY: Weigh yourself in the morning before breakfast. Write it down and keep it in a log. Take your medicines as prescribed Eat low salt foods--Limit salt (sodium) to 2000 mg per day.  Stay as active as you can everyday Limit all fluids for the day to less than 2 liters  Follow-Up in: 1 week (Tue 03/11/22 at 11 am)    If you have any questions or concerns before your next appointment please send [place] a message through [place] or call our office at [phone] Monday-Friday 8 am-5 pm.   If you have an urgent need after hours on the weekend please call your Primary Cardiologist or the [facility] in [place] at [phone].

## 2022-03-07 NOTE — Progress Notes (Signed)
Provided patient education on Furoscix using demo kits and Furoscix video, QR code provided on AVS for further viewing. Pt provided with 1 sample kit to use.  Medication Samples have been provided to the patient.  Drug name: Furoscix       Strength: 80mg         Qty: 1  LOT: [id number]  Exp.Date: 11/24/23  Dosing instructions: Use kit TODAY  The patient has been instructed regarding the correct time, dose, and frequency of taking this medication, including desired effects and most common side effects.   Logan Crawford 12:56 PM 03/07/2022

## 2022-03-10 ENCOUNTER — Ambulatory Visit
Admission: RE | Admit: 2022-03-10 | Discharge: 2022-03-10 | Disposition: A | Discharge status: Home / Self Care | Payer: Medicaid Other | Source: Ambulatory Visit | Attending: Internal Medicine | Admitting: Internal Medicine

## 2022-03-10 ENCOUNTER — Other Ambulatory Visit: Payer: Self-pay | Admitting: *Deleted

## 2022-03-10 ENCOUNTER — Other Ambulatory Visit: Payer: Self-pay | Admitting: Cardiothoracic Surgery

## 2022-03-10 ENCOUNTER — Ambulatory Visit
Admission: RE | Admit: 2022-03-10 | Discharge: 2022-03-10 | Disposition: A | Discharge status: Home / Self Care | Payer: Self-pay | Source: Ambulatory Visit | Attending: Student | Admitting: Student

## 2022-03-10 ENCOUNTER — Other Ambulatory Visit
Admission: RE | Admit: 2022-03-10 | Discharge: 2022-03-10 | Disposition: A | Discharge status: Home / Self Care | Payer: Medicaid Other | Source: Home / Self Care | Attending: Cardiothoracic Surgery | Admitting: Cardiothoracic Surgery

## 2022-03-10 VITALS — BP 99/63 | HR 92 | Resp 20

## 2022-03-10 DIAGNOSIS — Z951 Presence of aortocoronary bypass graft: Secondary | ICD-10-CM

## 2022-03-10 DIAGNOSIS — Z9889 Other specified postprocedural states: Secondary | ICD-10-CM

## 2022-03-10 DIAGNOSIS — J90 Pleural effusion, not elsewhere classified: Secondary | ICD-10-CM | POA: Insufficient documentation

## 2022-03-10 NOTE — Procedures (Signed)
PROCEDURE SUMMARY:  Successful image-guided left thoracentesis. Yielded 750 mL of dark red fluid. Pt tolerated procedure well. No immediate complications. EBL = trace   Specimen was not sent for labs. CXR ordered.  Please see imaging section of Epic for full dictation.  Lura Em PA-C 03/10/2022 3:33 PM

## 2022-03-11 ENCOUNTER — Telehealth: Payer: Self-pay | Admitting: *Deleted

## 2022-03-11 ENCOUNTER — Encounter: Payer: Self-pay | Admitting: Cardiology

## 2022-03-11 ENCOUNTER — Other Ambulatory Visit (HOSPITAL_COMMUNITY): Payer: Self-pay

## 2022-03-11 NOTE — Telephone Encounter (Signed)
Pt missed f/u appt today, called pt to f/u, he states he didn't realize he had an appt today, he did go to radiology yesterday and had thoracentesis with 750 mL out, he used Furoscix kit last week as instructed and is taking lasix 40 mg daily as ordered. He states his breathing is much improved, he has not being weighing himself. He is sch for gen cards Mon 1/22 and will keep that appt, if sob returns before then he will call us back. F/U appt with Dr Haroldine Laws resch for Feb, appt cards mailed to pt.

## 2022-03-13 ENCOUNTER — Ambulatory Visit (HOSPITAL_COMMUNITY): Payer: MEDICAID

## 2022-03-14 ENCOUNTER — Encounter: Payer: Self-pay | Admitting: Nurse Practitioner

## 2022-03-14 ENCOUNTER — Ambulatory Visit (INDEPENDENT_AMBULATORY_CARE_PROVIDER_SITE_OTHER): Payer: Medicaid Other | Admitting: Nurse Practitioner

## 2022-03-14 VITALS — BP 102/62 | HR 95 | Temp 97.3°F | Resp 16 | Wt 216.6 lb

## 2022-03-14 DIAGNOSIS — I5032 Chronic diastolic (congestive) heart failure: Secondary | ICD-10-CM

## 2022-03-14 DIAGNOSIS — Z951 Presence of aortocoronary bypass graft: Secondary | ICD-10-CM

## 2022-03-14 DIAGNOSIS — N179 Acute kidney failure, unspecified: Secondary | ICD-10-CM

## 2022-03-14 DIAGNOSIS — N1831 Chronic kidney disease, stage 3a: Secondary | ICD-10-CM | POA: Insufficient documentation

## 2022-03-14 DIAGNOSIS — F1910 Other psychoactive substance abuse, uncomplicated: Secondary | ICD-10-CM

## 2022-03-14 DIAGNOSIS — I214 Non-ST elevation (NSTEMI) myocardial infarction: Secondary | ICD-10-CM | POA: Diagnosis not present

## 2022-03-14 DIAGNOSIS — K219 Gastro-esophageal reflux disease without esophagitis: Secondary | ICD-10-CM

## 2022-03-14 DIAGNOSIS — Z09 Encounter for follow-up examination after completed treatment for conditions other than malignant neoplasm: Secondary | ICD-10-CM | POA: Diagnosis not present

## 2022-03-14 DIAGNOSIS — I2510 Atherosclerotic heart disease of native coronary artery without angina pectoris: Secondary | ICD-10-CM

## 2022-03-14 DIAGNOSIS — R3911 Hesitancy of micturition: Secondary | ICD-10-CM | POA: Insufficient documentation

## 2022-03-14 DIAGNOSIS — E785 Hyperlipidemia, unspecified: Secondary | ICD-10-CM

## 2022-03-14 MED ORDER — FAMOTIDINE 20 MG PO TABS: 20.0000 mg | ORAL_TABLET | Freq: Two times a day (BID) | ORAL | 3 refills | Status: DC

## 2022-03-14 NOTE — Assessment & Plan Note (Signed)
Status post CABG x 3.  Continue rosuvastatin 40 mg daily, Plavix 75 mg daily, aspirin 81 mg daily.  Maintain close follow-up with cardiology.

## 2022-03-14 NOTE — Assessment & Plan Note (Signed)
Currently on losartan 50 mg daily, Farxiga 10 mg daily Avoid NSAIDs and other nephrotoxic agents Maintain hydration, rechecking BMP today,   Lab Results  Component Value Date   NA 134 (L) 03/07/2022   K 5.1 03/07/2022   CO2 21 (L) 03/07/2022   BUN 28 (H) 03/07/2022   CREATININE 1.72 (H) 03/07/2022   CALCIUM 11.0 (H) 03/07/2022   GLUCOSE 114 (H) 03/07/2022

## 2022-03-14 NOTE — Assessment & Plan Note (Signed)
  Most recent echocardiogram LVEF 60% no pericardial effusion. Continue metoprolol 25 mg daily, losartan 50 mg daily, spironolactone 25 mg daily, furosemide 40 mg daily, Farxiga 10 mg daily. Maintain close  follow-up with the heart failure team.

## 2022-03-14 NOTE — Assessment & Plan Note (Signed)
States that he has not used cocaine since he left the hospital.  Patient was encouraged to continue to avoid use of illicit drugs

## 2022-03-14 NOTE — Assessment & Plan Note (Addendum)
CABG X3 on 02/19/2022 Mid sternal incision well approximated, no swelling , drainage or redness noted  . Sternal precautions  reviewed  with the patient.  Continue oxycodone 5mg  every 4 hours  as needed for pain  Gabapentin 100mg  BID

## 2022-03-14 NOTE — Progress Notes (Addendum)
New Patient Office Visit  Subjective    Patient ID: Logan Crawford, male    DOB: 16-Feb-1976  Age: 47 y.o. MRN: [id number]  CC:  Chief Complaint  Patient presents with   Hospitalization Follow-up    HPI Logan Crawford with past medical history of hypertension, polysubstance abuse, tobacco use disorder, CAD status post PCI in 2023, presents to establish care and for follow-up for hospital admission for NSTEMI.  He was on admission from 02/17/2022 to March 01, 2022.  Prior to his recent admission in September 2023 patient underwent left cardiac catheterization showing significant two-vessel CAD which was treated medically. On 02/17/2022 He had presented to the emergency room with chest pain typical to prior NSTEMI, he ended up undergoing CABG x 3 on 02/19/2022  He was discharged home on aspirin 81 mg daily, Plavix 75 mg daily, Farxiga 10 mg daily, losartan 50 mg daily, metoprolol 25 mg daily, nitro 0.4 mg as needed for chest pain, oxycodone as needed for pain rosuvastatin 40 mg daily, spironolactone 25 mg daily.  Lasix 40mg  daily added on by HF team.  He is been followed by cardiology and HF.   Patient c/o  shortness of breath worse with activity since he left the hospital ,recently had thoracentesis done due to large left pleural effusion.  He reports that he has been taking all medications as prescribed.  Still has some soreness on his left chest, no leg edema ,fever ,malaise, drainage from incision site.  He stated that he had foley catheter while at the hospital, , he reports making urine but does have some hesitancy and has to strain before passing urine.    He has followed up with advanced heart failure clinic and cardiothoracic surgery after he left the hospital.   He  reports that he has stopped smoking cigarettes and cocaine use.   Outpatient Encounter Medications as of 03/14/2022  Medication Sig   albuterol (VENTOLIN HFA) 108 (90 Base) MCG/ACT inhaler Inhale 2 puffs into the  lungs every 6 (six) hours as needed for wheezing or shortness of breath.   famotidine (PEPCID) 20 MG tablet Take 1 tablet (20 mg total) by mouth 2 (two) times daily.   gabapentin (NEURONTIN) 100 MG capsule Take 2 capsules (200 mg total) by mouth 2 (two) times daily.   [DISCONTINUED] aspirin EC 81 MG tablet Take 1 tablet (81 mg total) by mouth daily. Swallow whole.   [DISCONTINUED] clopidogrel (PLAVIX) 75 MG tablet Take 1 tablet (75 mg total) by mouth daily.   [DISCONTINUED] dapagliflozin propanediol (FARXIGA) 10 MG TABS tablet Take 1 tablet (10 mg total) by mouth daily.   [DISCONTINUED] furosemide (LASIX) 40 MG tablet Take 1 tablet (40 mg total) by mouth daily.   [DISCONTINUED] losartan (COZAAR) 50 MG tablet Take 1 tablet (50 mg total) by mouth daily.   [DISCONTINUED] metoprolol succinate (TOPROL-XL) 25 MG 24 hr tablet Take 1 tablet (25 mg total) by mouth daily.   [DISCONTINUED] nitroGLYCERIN (NITROSTAT) 0.4 MG SL tablet Place 1 tablet (0.4 mg total) under the tongue every 5 (five) minutes as needed for chest pain (if chest pain not resolved after 3 doses, please call 911).   [DISCONTINUED] rosuvastatin (CRESTOR) 40 MG tablet Take 1 tablet (40 mg total) by mouth daily.   [DISCONTINUED] spironolactone (ALDACTONE) 25 MG tablet Take 1 tablet (25 mg total) by mouth daily.   No facility-administered encounter medications on file as of 03/14/2022.    Past Medical History:  Diagnosis Date   CAD (  coronary artery disease)    a. 11/2019 NSTEMI/PCI: LM min irregs, LAD 22m (2.75x22 Resolute Onyx DES), 40d, D2 min irregs, LCX/LPAV min irregs, RCA 60ost, 40p/m, EF 45-50%; b. 11/2020 MV: EF 43%, small area of apical ischemia ->? over processing-->low risk.   Hyperlipidemia    Hypertension    Ischemic cardiomyopathy    a. 11/2019 LV Gram: EF 45-50%; b. 12/2019 Echo: EF 50% w/ mod mid-apical anteroseptal HK, GrI DD, nl RV fxn, mild Ao sclerosis.   Tobacco use 11/25/2019    Past Surgical History:  Procedure  Laterality Date   CORONARY ARTERY BYPASS GRAFT N/A 02/19/2022   Procedure: CORONARY ARTERY BYPASS GRAFTING (CABG) X THREE, USING ENDOSCOPICALLY HARVESTED RIGHT GREATER SAPHENOUS VEIN  AND LEFT ATRIAL APPENDAGE CLIPPING;  Surgeon: Neomia Glass, MD;  Location: [facility];  Service: Open Heart Surgery;  Laterality: N/A;   CORONARY STENT INTERVENTION N/A 01/24/2020   Procedure: CORONARY STENT INTERVENTION;  Surgeon: Wellington Hampshire, MD;  Location: [facility];  Service: Cardiovascular;  Laterality: N/A;   CORONARY STENT INTERVENTION N/A 05/20/2021   Procedure: CORONARY STENT INTERVENTION;  Surgeon: Wellington Hampshire, MD;  Location: [facility];  Service: Cardiovascular;  Laterality: N/A;   INTRAVASCULAR ULTRASOUND/IVUS N/A 05/20/2021   Procedure: Intravascular Ultrasound/IVUS;  Surgeon: Wellington Hampshire, MD;  Location: [facility];  Service: Cardiovascular;  Laterality: N/A;   IRRIGATION AND DEBRIDEMENT KNEE Left    LEFT HEART CATH AND CORONARY ANGIOGRAPHY N/A 01/24/2020   Procedure: LEFT HEART CATH AND CORONARY ANGIOGRAPHY;  Surgeon: Wellington Hampshire, MD;  Location: [facility];  Service: Cardiovascular;  Laterality: N/A;   LEFT HEART CATH AND CORONARY ANGIOGRAPHY N/A 11/18/2021   Procedure: LEFT HEART CATH AND CORONARY ANGIOGRAPHY;  Surgeon: Wellington Hampshire, MD;  Location: [facility];  Service: Cardiovascular;  Laterality: N/A;   LEFT HEART CATH AND CORONARY ANGIOGRAPHY N/A 02/13/2022   Procedure: LEFT HEART CATH AND CORONARY ANGIOGRAPHY;  Surgeon: Leonie Man, MD;  Location: [facility];  Service: Cardiovascular;  Laterality: N/A;   LEFT HEART CATH AND CORS/GRAFTS ANGIOGRAPHY N/A 05/20/2021   Procedure: LEFT HEART CATH AND CORS/GRAFTS ANGIOGRAPHY;  Surgeon: Wellington Hampshire, MD;  Location: [facility];  Service: Cardiovascular;  Laterality: N/A;   TYMPANOSTOMY TUBE PLACEMENT Bilateral     Family History  Problem Relation Age  of Onset   Coronary artery disease Mother    Arrhythmia Mother        Pacemaker placed in 2021   Heart failure Mother    Coronary artery disease Father     Social History   Socioeconomic History   Marital status: Single    Spouse name: Not on file   Number of children: 3   Years of education: Not on file   Highest education level: Not on file  Occupational History   Not on file  Tobacco Use   Smoking status: Former    Packs/day: 1.00    Years: 22.00    Total pack years: 22.00    Types: Cigarettes    Quit date: 01/23/2020    Years since quitting: 2.1   Smokeless tobacco: Never   Tobacco comments:    Smokes 1-2 cigarettes daily    Vaping Use   Vaping Use: Never used  Substance and Sexual Activity   Alcohol use: Yes    Alcohol/week: 1.0 standard drink of alcohol    Types: 1 Shots of liquor per week  Comment: socially   Drug use: No   Sexual activity: Not on file  Other Topics Concern   Not on file  Social History Narrative   Lives with his uncle    Social Determinants of Health   Financial Resource Strain: Not on file  Food Insecurity: No Food Insecurity (02/17/2022)   Hunger Vital Sign    Worried About Running Out of Food in the Last Year: Never true    Ran Out of Food in the Last Year: Never true  Transportation Needs: No Transportation Needs (02/17/2022)   PRAPARE - Administrator, Civil Service (Medical): No    Lack of Transportation (Non-Medical): No  Physical Activity: Not on file  Stress: Not on file  Social Connections: Not on file  Intimate Partner Violence: Not At Risk (02/17/2022)   Humiliation, Afraid, Rape, and Kick questionnaire    Fear of Current or Ex-Partner: No    Emotionally Abused: No    Physically Abused: No    Sexually Abused: No    Review of Systems  Constitutional:  Negative for diaphoresis, fever, malaise/fatigue and weight loss.  Respiratory:  Positive for shortness of breath. Negative for cough, hemoptysis, sputum  production and wheezing.   Cardiovascular: Negative.  Negative for palpitations, orthopnea, claudication, leg swelling and PND.       Chest wall soreness  Gastrointestinal:  Positive for heartburn. Negative for abdominal pain, diarrhea, nausea and vomiting.  Neurological:  Negative for dizziness, tingling, tremors, sensory change and headaches.  Psychiatric/Behavioral:  Negative for depression, hallucinations, substance abuse and suicidal ideas. The patient is not nervous/anxious.         Objective    BP 102/62   Pulse 95   Temp (!) 97.3 F (36.3 C)   Resp 16   Wt 216 lb 9.6 oz (98.2 kg)   SpO2 100%   BMI 29.38 kg/m   Physical Exam Constitutional:      General: He is not in acute distress.    Appearance: He is not ill-appearing, toxic-appearing or diaphoretic.  Eyes:     General: No scleral icterus.       Right eye: No discharge.        Left eye: No discharge.     Extraocular Movements: Extraocular movements intact.  Cardiovascular:     Rate and Rhythm: Normal rate and regular rhythm.     Pulses: Normal pulses.     Heart sounds: Normal heart sounds. No murmur heard.    No friction rub. No gallop.     Comments: Mid sternal incision well approximated. No redness, swelling or drainage noted,  Pulmonary:     Effort: Pulmonary effort is normal. No respiratory distress.     Breath sounds: Normal breath sounds. No stridor. No wheezing, rhonchi or rales.  Chest:     Chest wall: No tenderness.  Abdominal:     General: There is no distension.     Palpations: There is no mass.     Tenderness: There is no abdominal tenderness. There is no rebound.     Hernia: No hernia is present.  Musculoskeletal:        General: No swelling or signs of injury.     Right lower leg: No edema.     Left lower leg: No edema.  Neurological:     Mental Status: He is alert and oriented to person, place, and time.     Cranial Nerves: No cranial nerve deficit.     Sensory: No  sensory deficit.      Motor: No weakness.     Gait: Gait normal.  Psychiatric:        Mood and Affect: Mood normal.        Behavior: Behavior normal.        Thought Content: Thought content normal.        Judgment: Judgment normal.         Assessment & Plan:   Problem List Items Addressed This Visit       Cardiovascular and Mediastinum   NSTEMI (non-ST elevated myocardial infarction) (HCC) - Primary    Status post CABG x 3. Continue current medications and maintain close follow-up with cardiology.  Currently denies chest pain      CAD (coronary artery disease)    Status post CABG x 3.  Continue rosuvastatin 40 mg daily, Plavix 75 mg daily, aspirin 81 mg daily.  Maintain close follow-up with cardiology.      Chronic diastolic congestive heart failure (HCC)     Most recent echocardiogram LVEF 60% no pericardial effusion. Continue metoprolol 25 mg daily, losartan 50 mg daily, spironolactone 25 mg daily, furosemide 40 mg daily, Farxiga 10 mg daily. Maintain close  follow-up with the heart failure team.            Digestive   Gastroesophageal reflux disease without esophagitis    Pt c/o acid reflux uncontrolled with Tums.  - famotidine (PEPCID) 20 MG tablet; Take 1 tablet (20 mg total) by mouth 2 (two) times daily.   Avoid alcohol, spicy foods, fatty fried foods.       Relevant Medications   famotidine (PEPCID) 20 MG tablet     Genitourinary   AKI (acute kidney injury) (HCC)   Relevant Orders   Basic Metabolic Panel (Completed)   Stage 3a chronic kidney disease (HCC)    Currently on losartan 50 mg daily, Farxiga 10 mg daily Avoid NSAIDs and other nephrotoxic agents Maintain hydration, rechecking BMP today,   Lab Results  Component Value Date   NA 134 (L) 03/07/2022   K 5.1 03/07/2022   CO2 21 (L) 03/07/2022   BUN 28 (H) 03/07/2022   CREATININE 1.72 (H) 03/07/2022   CALCIUM 11.0 (H) 03/07/2022   GLUCOSE 114 (H) 03/07/2022           Other   Polysubstance abuse (HCC)  (Chronic)    States that he has not used cocaine since he left the hospital.  Patient was encouraged to continue to avoid use of illicit drugs      Hyperlipidemia    Continue rosuvastatin 40 mg daily      S/P CABG x 3    CABG X3 on 02/19/2022 Mid sternal incision well approximated, no swelling , drainage or redness noted  . Sternal precautions  reviewed  with the patient.  Continue oxycodone 5mg  every 4 hours  as needed for pain  Gabapentin 100mg  BID       Hospital discharge follow-up    presents to establish care and for follow-up for hospital admission for NSTEMI.  He was on admission from 02/17/2022 to March 01, 2022.  Prior to his recent admission in September 2023 patient underwent left cardiac catheterization showing significant two-vessel CAD which was treated medically. On 02/17/2022 He had presented to the emergency room with chest pain typical to prior NSTEMI, he ended up undergoing CABG x 3 on 02/19/2022  He was discharged home on aspirin 81 mg daily, Plavix 75 mg daily, Farxiga 10 mg  daily, losartan 50 mg daily, metoprolol 25 mg daily, nitro 0.4 mg as needed for chest pain, oxycodone as needed for pain rosuvastatin 40 mg daily, spironolactone 25 mg daily.  Lasix 40mg  daily added on by HF team.  Hospital discharge summary, labs and imaging studies reviewed by me today.        Hypercalcemia    Patient told to avoid calcium supplements maintain hydration Checking bMP today      Urinary hesitancy    Urinary hesitancy Since he left the hospital , I do not suspect UTI, Could be due to having foley catheter while he was in the hospital, patient told to go to the ER If he develops bladder fullness or not able to pass urine , he verbalized understanding.        Return in about 3 months (around 06/13/2022).   06/15/2022, FNP

## 2022-03-14 NOTE — Patient Instructions (Signed)
It is important that you exercise regularly at least 30 minutes 5 times a week as tolerated  Think about what you will eat, plan ahead. Choose " clean, green, fresh or frozen" over canned, processed or packaged foods which are more sugary, salty and fatty. 70 to 75% of food eaten should be vegetables and fruit. Three meals at set times with snacks allowed between meals, but they must be fruit or vegetables. Aim to eat over a 12 hour period , example 7 am to 7 pm, and STOP after  your last meal of the day. Drink water,generally about 64 ounces per day, no other drink is as healthy. Fruit juice is best enjoyed in a healthy way, by EATING the fruit.  Thanks for choosing [facility] we consider it a privelige to serve you.

## 2022-03-14 NOTE — Assessment & Plan Note (Signed)
Patient told to avoid calcium supplements maintain hydration Checking bMP today

## 2022-03-14 NOTE — Assessment & Plan Note (Signed)
Continue rosuvastatin 40mg daily  

## 2022-03-14 NOTE — Assessment & Plan Note (Signed)
Pt c/o acid reflux uncontrolled with Tums.  - famotidine (PEPCID) 20 MG tablet; Take 1 tablet (20 mg total) by mouth 2 (two) times daily.   Avoid alcohol, spicy foods, fatty fried foods.

## 2022-03-14 NOTE — Assessment & Plan Note (Signed)
Urinary hesitancy Since he left the hospital , I do not suspect UTI, Could be due to having foley catheter while he was in the hospital, patient told to go to the ER If he develops bladder fullness or not able to pass urine , he verbalized understanding.

## 2022-03-14 NOTE — Assessment & Plan Note (Signed)
Status post CABG x 3. Continue current medications and maintain close follow-up with cardiology.  Currently denies chest pain

## 2022-03-14 NOTE — Assessment & Plan Note (Signed)
presents to establish care and for follow-up for hospital admission for NSTEMI.  He was on admission from 02/17/2022 to March 01, 2022.  Prior to his recent admission in September 2023 patient underwent left cardiac catheterization showing significant two-vessel CAD which was treated medically. On 02/17/2022 He had presented to the emergency room with chest pain typical to prior NSTEMI, he ended up undergoing CABG x 3 on 02/19/2022  He was discharged home on aspirin 81 mg daily, Plavix 75 mg daily, Farxiga 10 mg daily, losartan 50 mg daily, metoprolol 25 mg daily, nitro 0.4 mg as needed for chest pain, oxycodone as needed for pain rosuvastatin 40 mg daily, spironolactone 25 mg daily.  Lasix 40mg  daily added on by HF team.  Hospital discharge summary, labs and imaging studies reviewed by me today.

## 2022-03-15 LAB — CULTURE, BLOOD (ROUTINE X 2)
Culture: NO GROWTH
Culture: NO GROWTH
Special Requests: ADEQUATE
Special Requests: ADEQUATE

## 2022-03-15 LAB — BASIC METABOLIC PANEL
BUN/Creatinine Ratio: 16 (ref 9–20)
BUN: 28 mg/dL — ABNORMAL HIGH (ref 6–24)
CO2: 21 mmol/L (ref 20–29)
Calcium: 9.7 mg/dL (ref 8.7–10.2)
Chloride: 101 mmol/L (ref 96–106)
Creatinine, Ser: 1.73 mg/dL — ABNORMAL HIGH (ref 0.76–1.27)
Glucose: 106 mg/dL — ABNORMAL HIGH (ref 70–99)
Potassium: 4.3 mmol/L (ref 3.5–5.2)
Sodium: 137 mmol/L (ref 134–144)
eGFR: 48 mL/min/{1.73_m2} — ABNORMAL LOW (ref >59)

## 2022-03-16 NOTE — Progress Notes (Signed)
Cardiology Office Note    Date:  03/17/2022   ID:  Logan Crawford, DOB 05/18/1975, MRN [id number]  PCP:  Donell BeersPaseda, Folashade R, FNP  Cardiologist:  Debbe OdeaBrian Agbor-Etang, MD  Electrophysiologist:  None  Advanced heart failure: Bensimhon  Chief Complaint: Hospital follow-up  History of Present Illness:   Logan HummerJohnathan C Crawford is a 47 y.o. male with history of CAD with NSTEMI status post PCI/DES to the LAD in 12/2019 status post PCI to the LAD due to in-stent restenosis status post three-vessel CABG on 02/19/2022, medication nonadherence, CKD stage III, HTN, HLD, and polysubstance use including cocaine and tobacco use who presents for hospital follow-up as outlined below.   He was admitted to the hospital in 11/2019 with progressive weakness, fatigue, dyspnea, cough, and diarrhea.  He was found to be COVID-positive.  High-sensitivity troponin peaked at 32.  Echo showed an EF of 50 to 55%, no regional wall motion abnormalities, grade 1 diastolic dysfunction, normal RV systolic function and ventricular cavity size, and no significant valvular abnormalities.  He was readmitted to the hospital in 12/2019 with an NSTEMI with troponin peaking at 3520.  Echo at that time showed an EF of 50%, moderate hypokinesis of the mid apical anteroseptal wall, mild LVH, diastolic dysfunction, normal RV systolic function and ventricular cavity size, and mild aortic valve sclerosis without evidence of stenosis.  LHC showed severe one-vessel CAD with 90% stenosis in the mid LAD which was felt to be the likely culprit for the NSTEMI.  There was also 60% ostial to proximal RCA stenosis, 40% proximal to mid RCA stenosis, and 40% distal LAD stenosis.  LVEF was 45% with moderate to severe anterior apical hypokinesis.  He underwent successful PCI/DES to the mid LAD.  Lexiscan MPI in 11/2020 showed a small region of ischemia in the apical wall versus concern for over a processing, and was overall low risk.  He was admitted to the  hospital in 04/2021 with chest pain with troponin peaking at 175.  Urine drug screen positive for cocaine.  Echo showed an EF of 50 to 55%, hypokinesis of the mid to distal anteroseptal/septal and apical region, moderate LVH, grade 1 diastolic dysfunction, normal RV systolic function and ventricular cavity size, mild mitral regurgitation, aortic valve sclerosis without evidence of stenosis, and an estimated right atrial pressure of 3 mmHg.  LHC showed severe one-vessel CAD due to distal edge restenosis of the previously placed stent in the LAD.  In addition, there was extensive plaque distal to the stent involving the whole mid segment.  There was stable moderate RCA disease.  He underwent successful complex IVUS guided bifurcation angioplasty and DES placement to the mid LAD.  IVUS before PCI showed underexpansion distally as well as extensive plaque in distal to the stent.  No stent fractures.  Another stent was added distally due to suspected distal edge dissection.  PTCA was performed in the ostial D2 before placing the stent.  Echo in 06/2021 demonstrated an EF of 60 to 65%, no regional wall motion abnormalities, moderate LVH, grade 2 diastolic dysfunction, normal RV systolic function and ventricular cavity size, mild mitral regurgitation, and an estimated right atrial pressure of 3 mmHg.  He was admitted in 10/2021 with unstable angina.  He was not taking his medications.  Urine drug screen was positive for cocaine.  Echo showed an EF of 60 to 65%, no regional wall motion abnormalities, moderate concentric LVH, grade 1 diastolic dysfunction, normal RV systolic function and ventricular cavity size,  trivial mitral regurgitation, aortic valve sclerosis without evidence of stenosis, and an estimated right atrial pressure of 3 mmHg.  LHC showed significant two-vessel CAD.  Patent overlapped LAD stents with evidence of significant in-stent restenosis in the proximal segment.  In addition, there was borderline 60%  stenosis distal to the stent.  There was known moderate RCA disease, though the distal stenosis appeared worse.  Intracoronary nitroglycerin was given which improved the overall appearance of the coronary arteries.  Normal LV systolic function with moderately elevated LVEDP.  Intervention was deferred at that time given overall difficult situation due to patient poor compliance and medical therapy and continued cocaine use.  After further discussion with the patient, the decision was made to optimize antianginal therapy with plan for outpatient follow-up and reconsideration of revascularization if he remained symptomatic and adherent to medications.  He was admitted to the hospital in 01/2022 with an NSTEMI in the setting of continued cocaine use and nonadherence with antiplatelet therapy.  Echo showed an EF of 60 to 65%, no regional wall motion maladies, normal RV systolic function and ventricular cavity size, and mild mitral regurgitation.  LHC showed 2 potential culprit lesions including mid to distal RCA ulcerated 95% stenosis and mid to distal LAD stent edge 99% stenosis as well as moderate disease as outlined below.  He was transferred to [facility] and underwent T CTS evaluation with recommendation for CABG.  He underwent successful three-vessel CABG (LIMA to diagonal, SVG to LAD, and SVG to RCA) with intraoperative course being complicated by hyperkalemia and bradycardic arrest prior to initiation of cardiopulmonary bypass with requirement of intraoperative aortic balloon pump as well as inotropic and vasopressor support.  Postoperative clinical course was notable for continued vasopressor and inotropic support including epinephrine, Levophed, vasopressin as well as milrinone and intra-aortic balloon pump.  While intubated he showed significant evidence of agitation requiring adjustments in narcotics and sedatives.  There was also some AKI, peaking at 2.5, that improved over time.  The etiology of his  hyperkalemia was not entirely clear with some concern this could have been atypical malignant hyperthermia.  He required significant diuresis and had expected postoperative blood loss.  Toxicology was positive for cocaine during the admission.  There was a significant amount of sternal drainage from his sternotomy.  Follow-up echo prior to discharge showed an EF of 50 to 55%, mild LVH, grade 2 diastolic dysfunction, elevated LVEDP, normal RV systolic function and ventricular cavity size, mild mitral regurgitation, mild calcification on the aortic valve with mild insufficiency and no evidence of aortic stenosis, mildly dilated aortic root measuring 36 mm, and an estimated right atrial pressure of 3 mmHg.  Posthospitalization course has been complicated by left pleural effusion requiring thoracentesis, with 750 mL of thin dark red fluid removed, on 03/10/2022.  He has been followed by the advanced heart failure service and required Furoscix.  He comes in today noting a return in dyspnea and 4-5 pillow orthopnea similar to how he felt leading up to his thoracentesis last week.  No frank chest pain.  No dizziness, presyncope, or syncope.  He reports he has not drink alcohol, smoke tobacco, or used cocaine since undergoing CABG.  He reports adherence to his medications.  He continues to have a sensation of being unable to fully empty his bladder while voiding since having urinary catheter removed.  He is drinking greater than 2 L of liquid per day and is trying to watch his salt intake.  No further sternal incision  site drainage.   Labs independently reviewed: 02/2022 - BUN 28, serum creatinine 1.73, potassium 4.3, hemoglobin 12.3, PLT 883, BNP 103, magnesium 2.0 01/2022 - albumin 3.1, AST/ALT normal 10/2021 - TC 179, TG 284, HDL 29, LDL 93, LP(a) 50 04/2021 - G2E 5.8 07/2017 - TSH normal  Past Medical History:  Diagnosis Date   CAD (coronary artery disease)    a. 11/2019 NSTEMI/PCI: LM min irregs, LAD 83m  (2.75x22 Resolute Onyx DES), 40d, D2 min irregs, LCX/LPAV min irregs, RCA 60ost, 40p/m, EF 45-50%; b. 11/2020 MV: EF 43%, small area of apical ischemia ->? over processing-->low risk.   Hyperlipidemia    Hypertension    Ischemic cardiomyopathy    a. 11/2019 LV Gram: EF 45-50%; b. 12/2019 Echo: EF 50% w/ mod mid-apical anteroseptal HK, GrI DD, nl RV fxn, mild Ao sclerosis.   Tobacco use 11/25/2019    Past Surgical History:  Procedure Laterality Date   CORONARY ARTERY BYPASS GRAFT N/A 02/19/2022   Procedure: CORONARY ARTERY BYPASS GRAFTING (CABG) X THREE, USING ENDOSCOPICALLY HARVESTED RIGHT GREATER SAPHENOUS VEIN  AND LEFT ATRIAL APPENDAGE CLIPPING;  Surgeon: Lyn Hollingshead, MD;  Location: [facility] OR;  Service: Open Heart Surgery;  Laterality: N/A;   CORONARY STENT INTERVENTION N/A 01/24/2020   Procedure: CORONARY STENT INTERVENTION;  Surgeon: Iran Ouch, MD;  Location: [facility] INVASIVE CV LAB;  Service: Cardiovascular;  Laterality: N/A;   CORONARY STENT INTERVENTION N/A 05/20/2021   Procedure: CORONARY STENT INTERVENTION;  Surgeon: Iran Ouch, MD;  Location: [facility] INVASIVE CV LAB;  Service: Cardiovascular;  Laterality: N/A;   INTRAVASCULAR ULTRASOUND/IVUS N/A 05/20/2021   Procedure: Intravascular Ultrasound/IVUS;  Surgeon: Iran Ouch, MD;  Location: [facility] INVASIVE CV LAB;  Service: Cardiovascular;  Laterality: N/A;   IRRIGATION AND DEBRIDEMENT KNEE Left    LEFT HEART CATH AND CORONARY ANGIOGRAPHY N/A 01/24/2020   Procedure: LEFT HEART CATH AND CORONARY ANGIOGRAPHY;  Surgeon: Iran Ouch, MD;  Location: [facility] INVASIVE CV LAB;  Service: Cardiovascular;  Laterality: N/A;   LEFT HEART CATH AND CORONARY ANGIOGRAPHY N/A 11/18/2021   Procedure: LEFT HEART CATH AND CORONARY ANGIOGRAPHY;  Surgeon: Iran Ouch, MD;  Location: [facility] INVASIVE CV LAB;  Service: Cardiovascular;  Laterality: N/A;   LEFT HEART CATH AND CORONARY ANGIOGRAPHY N/A 02/13/2022   Procedure: LEFT HEART CATH AND  CORONARY ANGIOGRAPHY;  Surgeon: Marykay Lex, MD;  Location: [facility] INVASIVE CV LAB;  Service: Cardiovascular;  Laterality: N/A;   LEFT HEART CATH AND CORS/GRAFTS ANGIOGRAPHY N/A 05/20/2021   Procedure: LEFT HEART CATH AND CORS/GRAFTS ANGIOGRAPHY;  Surgeon: Iran Ouch, MD;  Location: [facility] INVASIVE CV LAB;  Service: Cardiovascular;  Laterality: N/A;   TYMPANOSTOMY TUBE PLACEMENT Bilateral     Current Medications: Current Meds  Medication Sig   albuterol (VENTOLIN HFA) 108 (90 Base) MCG/ACT inhaler Inhale 2 puffs into the lungs every 6 (six) hours as needed for wheezing or shortness of breath.   famotidine (PEPCID) 20 MG tablet Take 1 tablet (20 mg total) by mouth 2 (two) times daily.   gabapentin (NEURONTIN) 100 MG capsule Take 2 capsules (200 mg total) by mouth 2 (two) times daily.   [DISCONTINUED] aspirin EC 81 MG tablet Take 1 tablet (81 mg total) by mouth daily. Swallow whole.   [DISCONTINUED] clopidogrel (PLAVIX) 75 MG tablet Take 1 tablet (75 mg total) by mouth daily.   [DISCONTINUED] dapagliflozin propanediol (FARXIGA) 10 MG TABS tablet Take 1 tablet (10 mg total) by mouth daily.   [DISCONTINUED] furosemide (LASIX) 40  MG tablet Take 1 tablet (40 mg total) by mouth daily.   [DISCONTINUED] losartan (COZAAR) 50 MG tablet Take 1 tablet (50 mg total) by mouth daily.   [DISCONTINUED] metoprolol succinate (TOPROL-XL) 25 MG 24 hr tablet Take 1 tablet (25 mg total) by mouth daily.   [DISCONTINUED] nitroGLYCERIN (NITROSTAT) 0.4 MG SL tablet Place 1 tablet (0.4 mg total) under the tongue every 5 (five) minutes as needed for chest pain (if chest pain not resolved after 3 doses, please call 911).   [DISCONTINUED] rosuvastatin (CRESTOR) 40 MG tablet Take 1 tablet (40 mg total) by mouth daily.   [DISCONTINUED] spironolactone (ALDACTONE) 25 MG tablet Take 1 tablet (25 mg total) by mouth daily.    Allergies:   Patient has no known allergies.   Social History   Socioeconomic History   Marital  status: Single    Spouse name: Not on file   Number of children: 3   Years of education: Not on file   Highest education level: Not on file  Occupational History   Not on file  Tobacco Use   Smoking status: Former    Packs/day: 1.00    Years: 22.00    Total pack years: 22.00    Types: Cigarettes    Quit date: 01/23/2020    Years since quitting: 2.1   Smokeless tobacco: Never   Tobacco comments:    Smokes 1-2 cigarettes daily    Vaping Use   Vaping Use: Never used  Substance and Sexual Activity   Alcohol use: Yes    Alcohol/week: 1.0 standard drink of alcohol    Types: 1 Shots of liquor per week    Comment: socially   Drug use: No   Sexual activity: Not on file  Other Topics Concern   Not on file  Social History Narrative   Lives with his uncle    Social Determinants of Health   Financial Resource Strain: Not on file  Food Insecurity: No Food Insecurity (02/17/2022)   Hunger Vital Sign    Worried About Running Out of Food in the Last Year: Never true    Ran Out of Food in the Last Year: Never true  Transportation Needs: No Transportation Needs (02/17/2022)   PRAPARE - Hydrologist (Medical): No    Lack of Transportation (Non-Medical): No  Physical Activity: Not on file  Stress: Not on file  Social Connections: Not on file     Family History:  The patient's family history includes Arrhythmia in his mother; Coronary artery disease in his father and mother; Heart failure in his mother.  ROS:   12-point review of systems is negative unless otherwise noted in the HPI.   EKGs/Labs/Other Studies Reviewed:    Studies reviewed were summarized above. The additional studies were reviewed today:  2D echo 02/21/2022: 1. Thickening of the aortic vavle and mitral valve was compared to prior  transthoracic echoes and is unchanged. It was not present on TEE, and is  there likely artifactual.   2. Left ventricular ejection fraction, by  estimation, is 50 to 55%. The  left ventricle has low normal function. The left ventricle demonstrates  regional wall motion abnormalities (see scoring diagram/findings for  description). There is mild left  ventricular hypertrophy of the lateral segment. Left ventricular diastolic  parameters are consistent with Grade II diastolic dysfunction  (pseudonormalization). Elevated left ventricular end-diastolic pressure.   3. Right ventricular systolic function is normal. The right ventricular  size is normal.  4. The mitral valve is normal in structure. Mild mitral valve  regurgitation. No evidence of mitral stenosis.   5. The aortic valve has an indeterminant number of cusps. There is mild  calcification of the aortic valve. There is mild thickening of the aortic  valve. Aortic valve regurgitation is mild. No aortic stenosis is present.   6. Aortic dilatation noted. There is mild dilatation of the aortic root,  measuring 36 mm.   7. The inferior vena cava is normal in size with greater than 50%  respiratory variability, suggesting right atrial pressure of 3 mmHg.  __________  Intraoperative TEE 02/19/2022: POST-OP IMPRESSIONS  _ Left Ventricle: The left ventricle appeared globally hypokenetic upon  seperation from cardiopulmonary bypass. In time it appears unchanged from  pre-bypass.  _ Right Ventricle: The right ventricle appears unchanged from pre-bypass.  _ Aorta: A balloon pump is in place with the tip in the proximal  descending  aorta.  _ Aortic Valve: The aortic valve appears unchanged from pre-bypass.  _ Mitral Valve: The mitral valve appears unchanged from pre-bypass.  _ Tricuspid Valve: The tricuspid valve appears unchanged from pre-bypass.  _ Pulmonic Valve: The pulmonic valve appears unchanged from pre-bypass.  _ Interatrial Septum: The interatrial septum appears unchanged from  pre-bypass.  _ Pericardium: The pericardium appears unchanged from pre-bypass.   __________  Pre-CABG vascular imaging 02/18/2022: Summary:  Right Carotid: Velocities in the right ICA are consistent with a 1-39%  stenosis.   Left Carotid: Velocities in the left ICA are consistent with a 1-39%  stenosis.  Vertebrals: Bilateral vertebral arteries demonstrate antegrade flow.   Right ABI: Normal waveforms.  Left ABI: Normal waveforms.  Right Upper Extremity: Doppler waveforms remain within normal limits with  right radial compression. Doppler waveform obliterate with right ulnar  compression.  Left Upper Extremity: Doppler waveforms remain within normal limits with  left radial compression. Doppler waveforms remain within normal limits  with left ulnar compression.  __________  Limited echo 02/18/2022: 1. Small area of distal septal/inferior basal hypokinesis . Left  ventricular ejection fraction, by estimation, is 55%. The left ventricle  has normal function. The left ventricle has no regional wall motion  abnormalities. There is mild left ventricular  hypertrophy. Left ventricular diastolic parameters are consistent with  Grade I diastolic dysfunction (impaired relaxation).   2. Right ventricular systolic function is normal. The right ventricular  size is normal.   3. The mitral valve is abnormal. Mild mitral valve regurgitation. No  evidence of mitral stenosis.   4. The aortic valve is tricuspid. Aortic valve regurgitation is not  visualized. No aortic stenosis is present.   5. The inferior vena cava is normal in size with greater than 50%  respiratory variability, suggesting right atrial pressure of 3 mmHg.  __________  LHC 02/13/2022:   Proximal to distal LAD stents: Prox LAD to Mid LAD lesion is 70% stenosed. Jailed 2nd Diag lesion is 80% stenosed.   Mid LAD-1 lesion is 80% stenosed. Mid LAD-2 lesion is 50% stenosed.  (All ISR).  Dist LAD lesion is 99% stenosed (at distal stent edge)   Prox RCA-1 lesion is 70% stenosed. Prox RCA-2 lesion is 70%  stenosed.   Mid RCA-1 lesion is 70% stenosed.  Mid RCA-2 lesion is 95% stenosed (ulcerated flat lesion)   RV Branch lesion is 60% stenosed.   RPDA lesion is 60% stenosed.   LV end diastolic pressure is severely elevated.   There is no aortic valve stenosis.  Progression of Severe Multivessel: - 2 potential culprit lesions: distal-mid RCA ulcerated 95% ruptured plaque (mid #2), distal mid LAD stent edge 99% with diffuse disease elsewhere RCA:-proximal #1 70%, followed by proximal #2 70%, mid #1 1 70% and mid #2 95%, ostial PDA 60% LAD: Long stented area with proximal concentric 70% are, jailed D2 85% Zio, distal stent focal 80% followed by segmental 50% then 99% at distal edge. LCx-small OM1 and OM 2 with bifurcating OM 3, AVG circumflex with small branches      Recommendation: Restart IV heparin 2 hours after sheath removal.  [facility]  Hospitalist and cardiology team was notified.  Transfer process has been initiated pending bed   He was on IV NTG upon arrival, but this was discontinued and he has been pain-free.  May need to restart. __________  Limited echo 02/12/2022: 1. Left ventricular ejection fraction, by estimation, is 60 to 65%. The  left ventricle has normal function. The left ventricle has no regional  wall motion abnormalities.   2. Right ventricular systolic function is normal. The right ventricular  size is normal.   3. The mitral valve is normal in structure. Mild mitral valve  regurgitation.  __________  LHC 11/18/2021:   Ost LM lesion is 30% stenosed.   Dist LAD lesion is 40% stenosed.   Ost RCA to Prox RCA lesion is 50% stenosed.   Prox RCA to Mid RCA lesion is 40% stenosed.   2nd Diag lesion is 30% stenosed.   RPDA lesion is 40% stenosed.   Mid LAD-3 lesion is 60% stenosed.   Mid LAD-1 lesion is 80% stenosed.   Mid RCA lesion is 70% stenosed.   The left ventricular systolic function is normal.    LV end diastolic pressure is mildly elevated.   The left ventricular ejection fraction is 55-65% by visual estimate.   1.  Significant two-vessel coronary artery disease.  Patent overlapped LAD stents but there is evidence of significant in-stent restenosis in the proximal segment.  In addition, there is borderline 60% stenosis distal to the stent.  Known moderate RCA disease but the distal stenosis appears worse. Intracoronary nitroglycerin was given which improved the overall appearance of the coronary arteries. 2.  Normal LV systolic function and moderately elevated left ventricular end-diastolic pressure.   Recommendations: Difficult situation overall given the patient's poor compliance with medical therapy and continued cocaine use.  This is a second in-stent restenosis in the LAD.  Management options include CABG or balloon angioplasty of the proximal portion of the LAD stent.  Will discuss options with the patient. __________  2D echo 11/16/2021: 1. Left ventricular ejection fraction, by estimation, is 60 to 65%. The  left ventricle has normal function. The left ventricle has no regional  wall motion abnormalities. There is moderate concentric left ventricular  hypertrophy. Left ventricular  diastolic parameters are consistent with Grade I diastolic dysfunction  (impaired relaxation).   2. Right ventricular systolic function is normal. The right ventricular  size is normal. Tricuspid regurgitation signal is inadequate for assessing  PA pressure.   3. The mitral valve is grossly normal. Trivial mitral valve  regurgitation. No evidence of mitral stenosis.   4. The aortic valve is tricuspid. There is mild calcification of the  aortic valve. Aortic valve regurgitation is not visualized. Aortic valve  sclerosis is present, with no evidence of aortic valve stenosis.   5. The inferior vena cava is normal in size  with greater than 50%  respiratory variability, suggesting right atrial  pressure of 3 mmHg.   Comparison(s): No significant change from prior study.  __________  Limited echo 07/16/2021: 1. Left ventricular ejection fraction, by estimation, is 60 to 65%. The  left ventricle has normal function. The left ventricle has no regional  wall motion abnormalities. There is moderate left ventricular hypertrophy.  Left ventricular diastolic  parameters are consistent with Grade II diastolic dysfunction  (pseudonormalization). The average left ventricular global longitudinal  strain is -11.7 %.   2. Right ventricular systolic function is normal. The right ventricular  size is normal. Tricuspid regurgitation signal is inadequate for assessing  PA pressure.   3. The mitral valve is normal in structure. Mild mitral valve  regurgitation. No evidence of mitral stenosis.   4. The aortic valve is normal in structure. Aortic valve regurgitation is  not visualized. No aortic stenosis is present.   5. The inferior vena cava is normal in size with greater than 50%  respiratory variability, suggesting right atrial pressure of 3 mmHg.  __________  LHC 05/20/2021:   Dist LAD lesion is 40% stenosed.   Prox RCA to Mid RCA lesion is 40% stenosed.   Ost RCA to Prox RCA lesion is 50% stenosed.   RPDA lesion is 40% stenosed.   Ost LM lesion is 30% stenosed.   Mid LAD lesion is 85% stenosed.   2nd Diag lesion is 60% stenosed.   A drug-eluting stent was successfully placed using a Granby [id number].   Balloon angioplasty was performed using a BALLN TREK [id number] 2.5X12.   Post intervention, there is a 0% residual stenosis.   Post intervention, there is a 30% residual stenosis.   LV end diastolic pressure is moderately elevated.   The left ventricular ejection fraction is 50-55% by visual estimate.   1.  Severe one-vessel coronary artery disease due to distal edge restenosis of the previously placed stent in the left anterior descending artery.  In addition, there is extensive  plaque distal to the stent involving the whole mid segment.  Stable moderate right coronary artery disease. 2.  Low normal LV systolic function with an EF of 50 to 55% with mild anterior apical hypokinesis.  Moderately elevated left ventricular end-diastolic pressure. 3.  Successful complex IVUS guided bifurcation angioplasty and drug-eluting stent placement to the mid LAD.  IVUS before PCI showed stent underexpansion distally as well as extensive plaque distal to the stent.  No stent fractures.  Another stent was added distally due to suspected distal edge dissection.  Balloon angioplasty was performed and the ostial second diagonal before placing the stent.    Recommendations: Dual antiplatelet therapy for at least 1 year. Aggressive treatment of risk factors. I discussed with the patient the importance of compliance with medications. AVOID catheterization via the right radial artery in the future due to significant radial artery spasm.  Only a 5 [place] guide could be advanced. __________  2D echo 05/18/2021: 1. Left ventricular ejection fraction, by estimation, is 50 to 55%. The  left ventricle has low normal function. The left ventricle demonstrates  regional wall motion abnormalities (hypokinesis of the mid to distal  anteroseptal/septal, apical region There   is moderate left ventricular hypertrophy. Left ventricular diastolic  parameters are consistent with Grade I diastolic dysfunction (impaired  relaxation).   2. Right ventricular systolic function is normal. The right ventricular  size is normal. Tricuspid regurgitation signal is inadequate for assessing  PA pressure.  3. The mitral valve is normal in structure. Mild mitral valve  regurgitation. No evidence of mitral stenosis.   4. The aortic valve was not well visualized. Aortic valve regurgitation  is not visualized. Aortic valve sclerosis is present, with no evidence of  aortic valve stenosis.   5. The inferior vena cava is  normal in size with greater than 50%  respiratory variability, suggesting right atrial pressure of 3 mmHg.  __________  Eugenie BirksLexiscan MPI 11/29/2020: Pharmacological myocardial perfusion imaging study with small region of ischemia in the apical wall on attenuation corrected images  This defect is not noted on non-attenuation corrected images, raising the concern for over processing. Normal wall motion, EF estimated at 43% (unable to exclude component of GI uptake artifact) No EKG changes concerning for ischemia at peak stress or in recovery. Resting EKG with diffuse ST and T wave abnormality V3 through V6, I, II and aVL CT attenuation correction images with heavy coronary calcification in the LAD (stent), mild aortic atherosclerosis in the arch Low risk scan __________  [facility] There is mild left ventricular systolic dysfunction. The left ventricular ejection fraction is 45-50% by visual estimate. LV end diastolic pressure is moderately elevated. Ost RCA to Prox RCA lesion is 60% stenosed. Prox RCA to Mid RCA lesion is 40% stenosed. Mid LAD lesion is 90% stenosed. Post intervention, there is a 0% residual stenosis. A drug-eluting stent was successfully placed using a STENT RESOLUTE ONYX [id number]. Dist LAD lesion is 40% stenosed.   1.  Severe one-vessel coronary artery disease with 90% stenosis in the mid LAD which is the likely culprit for non-ST elevation myocardial infarction.  There is also moderate right coronary artery disease. 2.  Mildly reduced LV systolic function with an EF of 45% with moderate to severe anteroapical hypokinesis. 3.  Moderately elevated left ventricular end-diastolic pressure at 22 mmHg 4.  Successful angioplasty and drug-eluting stent placement to the mid LAD.   Recommendations: Dual antiplatelet therapy for at least 1 year.  Consult case manager for assistance with Brilinta.  Brilinta can be switched to clopidogrel or prasugrel after 1 month of treatment if  cost is an issue. I switch metoprolol to carvedilol.  Resume ACE inhibitor if renal function returns to baseline. Smoking cessation. Cardiac rehab. Likely discharge home tomorrow. __________  2D echo 01/24/2020: 1. Left ventricular ejection fraction, by estimation, is 50%. The left  ventricle has low normal function. The left ventricle demonstrates  regional wall motion abnormalities (see scoring diagram/findings for  description). There is mild left ventricular  hypertrophy. Left ventricular diastolic parameters are consistent with  Grade I diastolic dysfunction (impaired relaxation). There is moderate  hypokinesis of the left ventricular, mid-apical anteroseptal wall.   2. Right ventricular systolic function is normal. The right ventricular  size is normal.   3. The mitral valve is normal in structure. No evidence of mitral valve  regurgitation.   4. The aortic valve is tricuspid. Aortic valve regurgitation is not  visualized. Mild aortic valve sclerosis is present, with no evidence of  aortic valve stenosis.  __________  2D echo 11/26/2019: 1. Left ventricular ejection fraction, by estimation, is 50 to 55%. The  left ventricle has low normal function. The left ventricle has no regional  wall motion abnormalities. Left ventricular diastolic parameters are  consistent with Grade I diastolic  dysfunction (impaired relaxation).   2. Right ventricular systolic function is normal. The right ventricular  size is normal.   3. The mitral valve is normal  in structure. No evidence of mitral valve  regurgitation.   4. The aortic valve is calcified. Aortic valve regurgitation is not  visualized. Mild aortic valve sclerosis is present, with no evidence of  aortic valve stenosis.     EKG:  EKG is ordered today.  The EKG ordered today demonstrates NSR, 90 bpm, lateral T wave inversion with nonspecific inferior ST-T changes consistent with prior tracing  Recent Labs: 02/22/2022: ALT  11 02/24/2022: Magnesium 2.0 03/07/2022: B Natriuretic Peptide 103.5; Hemoglobin 12.3; Platelets 883 03/14/2022: BUN 28; Creatinine, Ser 1.73; Potassium 4.3; Sodium 137  Recent Lipid Panel    Component Value Date/Time   CHOL 179 11/17/2021 1216   TRIG 284 (H) 11/17/2021 1216   HDL 29 (L) 11/17/2021 1216   CHOLHDL 6.2 11/17/2021 1216   VLDL 57 (H) 11/17/2021 1216   LDLCALC 93 11/17/2021 1216    PHYSICAL EXAM:    VS:  BP 90/60 (BP Location: Left Arm, Patient Position: Sitting, Cuff Size: Large)   Pulse 88   Ht 6' (1.829 m)   Wt 222 lb 8 oz (100.9 kg)   SpO2 98%   BMI 30.18 kg/m   BMI: Body mass index is 30.18 kg/m.  Physical Exam Vitals reviewed.  Constitutional:      Appearance: He is well-developed.  HENT:     Head: Normocephalic and atraumatic.  Eyes:     General:        Right eye: No discharge.        Left eye: No discharge.  Neck:     Vascular: No JVD.  Cardiovascular:     Rate and Rhythm: Normal rate and regular rhythm.     Pulses:          Posterior tibial pulses are 2+ on the right side and 2+ on the left side.     Heart sounds: Normal heart sounds, S1 normal and S2 normal. Heart sounds not distant. No midsystolic click and no opening snap. No murmur heard.    No friction rub.     Comments: Midline sternal incision site is healing reasonably well without evidence of bleeding, bruising, erythema, or drainage. Pulmonary:     Effort: Pulmonary effort is normal. No respiratory distress.     Breath sounds: Examination of the left-middle field reveals decreased breath sounds. Examination of the left-lower field reveals decreased breath sounds. Decreased breath sounds present. No wheezing, rhonchi or rales.  Chest:     Chest wall: No tenderness.  Abdominal:     General: There is no distension.     Palpations: Abdomen is soft.  Musculoskeletal:     Cervical back: Normal range of motion.     Right lower leg: No edema.     Left lower leg: No edema.  Skin:    General:  Skin is warm and dry.     Nails: There is no clubbing.  Neurological:     Mental Status: He is alert and oriented to person, place, and time.  Psychiatric:        Speech: Speech normal.        Behavior: Behavior normal.        Thought Content: Thought content normal.        Judgment: Judgment normal.     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  Continue aggressive risk factor modification and secondary prevention including aspirin, clopidogrel, losartan, metoprolol succinate, and as needed SL NTG.  Follow-up with T CTS as directed.  Acute on chronic HFpEF with left-sided pleural effusion: Status post thoracentesis on 03/10/2022.  Symptoms and exam today concerning for recurrence of left pleural effusion.  Obtain stat chest x-ray with likely referral to interventional radiology today for repeat left-sided thoracentesis.  For now, continue furosemide 40 mg daily, spironolactone, and Farxiga 10 mg.  BMP obtained on 03/14/2022 showed stable renal function and potassium at goal.  HTN: Blood pressure is soft today at 90/60 with baseline typically in the low 100s to 1 teens over 60s.  Asymptomatic.  He remains on losartan and metoprolol.  HLD: LDL 93 in 10/2021 with normal AST/ALT in 01/2022.  He remains on rosuvastatin 40 mg.  Obtain fasting labs at next appointment.  CKD stage III with difficulty voiding: Stable on recent labs 3 days ago.  PCP has referred the patient to nephrology.  With noted sensation of being unable to fully empty his bladder following urinary catheter we will refer him to urology.  Polysubstance use: He reports he has abstained from alcohol, tobacco, or cocaine since  undergoing CABG.  Medication nonadherence: He reports adherence to medications.    Disposition: F/u with Dr. Azucena Cecil or an APP in 1 month, and advanced heart failure as directed.    Medication Adjustments/Labs and Tests Ordered: Current medicines are reviewed at length with the patient today.  Concerns regarding medicines are outlined above. Medication changes, Labs and Tests ordered today are summarized above and listed in the Patient Instructions accessible in Encounters.   Signed, Eula Listen, PA-C 03/17/2022 9:22 AM     [facility] [place] [address] [place], [place] [address] [phone]

## 2022-03-17 ENCOUNTER — Ambulatory Visit
Admission: RE | Admit: 2022-03-17 | Discharge: 2022-03-17 | Disposition: A | Discharge status: Home / Self Care | Payer: Medicaid Other | Source: Ambulatory Visit | Attending: Physician Assistant | Admitting: Physician Assistant

## 2022-03-17 ENCOUNTER — Ambulatory Visit (INDEPENDENT_AMBULATORY_CARE_PROVIDER_SITE_OTHER): Payer: Medicaid Other

## 2022-03-17 ENCOUNTER — Ambulatory Visit: Payer: Medicaid Other | Attending: Physician Assistant | Admitting: Physician Assistant

## 2022-03-17 ENCOUNTER — Ambulatory Visit
Admission: RE | Admit: 2022-03-17 | Discharge: 2022-03-17 | Disposition: A | Discharge status: Home / Self Care | Payer: Medicaid Other | Attending: Physician Assistant | Admitting: Physician Assistant

## 2022-03-17 ENCOUNTER — Other Ambulatory Visit: Payer: Self-pay | Admitting: Nurse Practitioner

## 2022-03-17 ENCOUNTER — Encounter: Payer: Self-pay | Admitting: Physician Assistant

## 2022-03-17 VITALS — BP 90/60 | HR 88 | Ht 72.0 in | Wt 222.5 lb

## 2022-03-17 DIAGNOSIS — E785 Hyperlipidemia, unspecified: Secondary | ICD-10-CM

## 2022-03-17 DIAGNOSIS — I10 Essential (primary) hypertension: Secondary | ICD-10-CM | POA: Diagnosis not present

## 2022-03-17 DIAGNOSIS — J90 Pleural effusion, not elsewhere classified: Secondary | ICD-10-CM | POA: Insufficient documentation

## 2022-03-17 DIAGNOSIS — R39198 Other difficulties with micturition: Secondary | ICD-10-CM | POA: Insufficient documentation

## 2022-03-17 DIAGNOSIS — I5033 Acute on chronic diastolic (congestive) heart failure: Secondary | ICD-10-CM

## 2022-03-17 DIAGNOSIS — N1831 Chronic kidney disease, stage 3a: Secondary | ICD-10-CM

## 2022-03-17 DIAGNOSIS — I2510 Atherosclerotic heart disease of native coronary artery without angina pectoris: Secondary | ICD-10-CM

## 2022-03-17 DIAGNOSIS — Z9189 Other specified personal risk factors, not elsewhere classified: Secondary | ICD-10-CM

## 2022-03-17 DIAGNOSIS — F1910 Other psychoactive substance abuse, uncomplicated: Secondary | ICD-10-CM

## 2022-03-17 DIAGNOSIS — N183 Chronic kidney disease, stage 3 unspecified: Secondary | ICD-10-CM

## 2022-03-17 LAB — POCT URINALYSIS DIPSTICK
Bilirubin, UA: NEGATIVE
Glucose, UA: POSITIVE — AB
Ketones, UA: NEGATIVE
Leukocytes, UA: NEGATIVE
Nitrite, UA: NEGATIVE
Protein, UA: NEGATIVE
Spec Grav, UA: 1.015 (ref 1.010–1.025)
Urobilinogen, UA: 0.2 E.U./dL
pH, UA: 5.5 (ref 5.0–8.0)

## 2022-03-17 MED ORDER — FUROSEMIDE 40 MG PO TABS: 40.0000 mg | ORAL_TABLET | Freq: Every day | ORAL | 3 refills | Status: DC

## 2022-03-17 MED ORDER — ROSUVASTATIN CALCIUM 40 MG PO TABS: 40.0000 mg | ORAL_TABLET | Freq: Every day | ORAL | 3 refills | Status: DC

## 2022-03-17 MED ORDER — CLOPIDOGREL BISULFATE 75 MG PO TABS: 75.0000 mg | ORAL_TABLET | Freq: Every day | ORAL | 3 refills | Status: DC

## 2022-03-17 MED ORDER — LOSARTAN POTASSIUM 50 MG PO TABS: 50.0000 mg | ORAL_TABLET | Freq: Every day | ORAL | 3 refills | Status: DC

## 2022-03-17 MED ORDER — SPIRONOLACTONE 25 MG PO TABS: 25.0000 mg | ORAL_TABLET | Freq: Every day | ORAL | 3 refills | Status: DC

## 2022-03-17 MED ORDER — ASPIRIN 81 MG PO TBEC: 81.0000 mg | DELAYED_RELEASE_TABLET | Freq: Every day | ORAL | 3 refills | Status: DC

## 2022-03-17 MED ORDER — METOPROLOL SUCCINATE ER 25 MG PO TB24: 25.0000 mg | ORAL_TABLET | Freq: Every day | ORAL | 3 refills | Status: DC

## 2022-03-17 MED ORDER — NITROGLYCERIN 0.4 MG SL SUBL: 0.4000 mg | SUBLINGUAL_TABLET | SUBLINGUAL | 2 refills | Status: DC | PRN

## 2022-03-17 MED ORDER — DAPAGLIFLOZIN PROPANEDIOL 10 MG PO TABS: 10.0000 mg | ORAL_TABLET | Freq: Every day | ORAL | 11 refills | Status: DC

## 2022-03-17 NOTE — Progress Notes (Signed)
Patient here for UA.  Per Lillie Columbia: Thanks , urine test negative for UTI. He should follow up with urology due to his complaint of urinary hesitancy . Cardiology should have placed the referral today .also follw up with nephrology for his CKD. Thanks.

## 2022-03-17 NOTE — Progress Notes (Signed)
Chronic kidney disease, lab is stable but not back to baseline , continue current medications as ordered, avoid ibuprofen and aleve, stay well hydrated, I have referred him to nephrology. I would like to do a urinalysis if the patient is able to stop by the clinic for this anytime this week.

## 2022-03-17 NOTE — Patient Instructions (Addendum)
Referral to urology for difficulty urinating after cathter removal.   Medication Instructions:  No changes at this time.   *If you need a refill on your cardiac medications before your next appointment, please call your pharmacy*   Lab Work: None  If you have labs (blood work) drawn today and your tests are completely normal, you will receive your results only by: [place] (if you have MyChart) OR A paper copy in the mail If you have any lab test that is abnormal or we need to change your treatment, we will call you to review the results.   Testing/Procedures: STAT Chest X-Ray and please stay at the Medical mall until we call you. STAY at [facility] until we call you.    Follow-Up: At [facility], you and your health needs are our priority.  As part of our continuing mission to provide you with exceptional heart care, we have created designated Provider Care Teams.  These Care Teams include your primary Cardiologist (physician) and Advanced Practice Providers (APPs -  Physician Assistants and Nurse Practitioners) who all work together to provide you with the care you need, wh  Your next appointment:   1 month(s)  Provider:   Kate Sable, MD or Christell Faith, PA-C

## 2022-03-19 ENCOUNTER — Ambulatory Visit
Admission: RE | Admit: 2022-03-19 | Discharge: 2022-03-19 | Disposition: A | Discharge status: Home / Self Care | Payer: Medicaid Other | Source: Ambulatory Visit | Attending: Cardiothoracic Surgery | Admitting: Cardiothoracic Surgery

## 2022-03-19 ENCOUNTER — Telehealth (HOSPITAL_COMMUNITY): Payer: Self-pay

## 2022-03-19 DIAGNOSIS — I10 Essential (primary) hypertension: Secondary | ICD-10-CM | POA: Diagnosis not present

## 2022-03-19 DIAGNOSIS — I255 Ischemic cardiomyopathy: Secondary | ICD-10-CM | POA: Diagnosis not present

## 2022-03-19 DIAGNOSIS — Z951 Presence of aortocoronary bypass graft: Secondary | ICD-10-CM | POA: Insufficient documentation

## 2022-03-19 DIAGNOSIS — I2510 Atherosclerotic heart disease of native coronary artery without angina pectoris: Secondary | ICD-10-CM | POA: Diagnosis not present

## 2022-03-19 LAB — ECHOCARDIOGRAM COMPLETE
AR max vel: 3.06 cm2
AV Area VTI: 3.01 cm2
AV Area mean vel: 3.2 cm2
AV Mean grad: 3 mmHg
AV Peak grad: 4.9 mmHg
Ao pk vel: 1.11 m/s
Area-P 1/2: 4.26 cm2
S' Lateral: 2.2 cm

## 2022-03-19 NOTE — Progress Notes (Signed)
*  PRELIMINARY RESULTS* Echocardiogram 2D Echocardiogram has been performed.  Logan Crawford 03/19/2022, 10:38 AM

## 2022-03-19 NOTE — Telephone Encounter (Signed)
Advanced Heart Failure Patient Advocate Encounter  Application for Jardiance faxed to [facility] on 03/19/22. Application form attached to patient chart.  Clista Bernhardt, CPhT Rx Patient Advocate Phone: [phone]

## 2022-03-20 ENCOUNTER — Other Ambulatory Visit (HOSPITAL_COMMUNITY): Payer: Self-pay

## 2022-03-24 NOTE — Telephone Encounter (Signed)
Advanced Heart Failure Patient Advocate Encounter  [employer] requested POI. Faxed income letter to [facility] on 03/24/22.

## 2022-03-25 ENCOUNTER — Other Ambulatory Visit: Payer: Self-pay

## 2022-03-25 ENCOUNTER — Encounter: Payer: Self-pay | Admitting: Urology

## 2022-03-25 ENCOUNTER — Ambulatory Visit (INDEPENDENT_AMBULATORY_CARE_PROVIDER_SITE_OTHER): Payer: Self-pay | Admitting: Urology

## 2022-03-25 ENCOUNTER — Other Ambulatory Visit: Payer: Self-pay | Admitting: Nurse Practitioner

## 2022-03-25 ENCOUNTER — Other Ambulatory Visit
Admission: RE | Admit: 2022-03-25 | Discharge: 2022-03-25 | Disposition: A | Discharge status: Home / Self Care | Payer: Medicaid Other | Attending: Urology | Admitting: Urology

## 2022-03-25 VITALS — BP 126/85 | HR 103 | Ht 72.0 in | Wt 217.0 lb

## 2022-03-25 DIAGNOSIS — R39198 Other difficulties with micturition: Secondary | ICD-10-CM

## 2022-03-25 DIAGNOSIS — R399 Unspecified symptoms and signs involving the genitourinary system: Secondary | ICD-10-CM

## 2022-03-25 DIAGNOSIS — R81 Glycosuria: Secondary | ICD-10-CM

## 2022-03-25 DIAGNOSIS — R300 Dysuria: Secondary | ICD-10-CM

## 2022-03-25 LAB — URINALYSIS, COMPLETE (UACMP) WITH MICROSCOPIC
Bilirubin Urine: NEGATIVE
Glucose, UA: >1000 mg/dL — AB
Ketones, ur: NEGATIVE mg/dL
Leukocytes,Ua: NEGATIVE
Nitrite: NEGATIVE
Protein, ur: NEGATIVE mg/dL
Specific Gravity, Urine: 1.015 (ref 1.005–1.030)
pH: 6 (ref 5.0–8.0)

## 2022-03-25 LAB — BLADDER SCAN AMB NON-IMAGING

## 2022-03-25 MED ORDER — SULFAMETHOXAZOLE-TRIMETHOPRIM 800-160 MG PO TABS: 1.0000 | ORAL_TABLET | Freq: Two times a day (BID) | ORAL | 0 refills | Status: DC

## 2022-03-25 MED ORDER — TAMSULOSIN HCL 0.4 MG PO CAPS: 0.4000 mg | ORAL_CAPSULE | Freq: Every day | ORAL | 0 refills | Status: DC

## 2022-03-25 NOTE — Progress Notes (Signed)
03/25/22 11:39 AM   Glean Hess [birth year] [id number]  CC: Urinary symptoms  HPI: Comorbid 47 year old male with history of substance abuse, recently underwent CABG for cardiac disease.  He had a catheter during that hospitalization, and reports he has had problems since that time with urinary frequency, urgency, dysuria, some leakage of urine, and difficulty initiating the stream.  He denies gross hematuria or UTIs.  Dipstick UA with PCP last week was benign.    Urinalysis today with greater than 1000 glucose, 6-10 RBC, few bacteria.  PVR normal at 15 mL.   PMH: Past Medical History:  Diagnosis Date   CAD (coronary artery disease)    a. 11/2019 NSTEMI/PCI: LM min irregs, LAD 24m (2.75x22 Resolute Onyx DES), 40d, D2 min irregs, LCX/LPAV min irregs, RCA 60ost, 40p/m, EF 45-50%; b. 11/2020 MV: EF 43%, small area of apical ischemia ->? over processing-->low risk.   Hyperlipidemia    Hypertension    Ischemic cardiomyopathy    a. 11/2019 LV Gram: EF 45-50%; b. 12/2019 Echo: EF 50% w/ mod mid-apical anteroseptal HK, GrI DD, nl RV fxn, mild Ao sclerosis.   Tobacco use 11/25/2019    Surgical History: Past Surgical History:  Procedure Laterality Date   CORONARY ARTERY BYPASS GRAFT N/A 02/19/2022   Procedure: CORONARY ARTERY BYPASS GRAFTING (CABG) X THREE, USING ENDOSCOPICALLY HARVESTED RIGHT GREATER SAPHENOUS VEIN  AND LEFT ATRIAL APPENDAGE CLIPPING;  Surgeon: Neomia Glass, MD;  Location: [place];  Service: Open Heart Surgery;  Laterality: N/A;   CORONARY STENT INTERVENTION N/A 01/24/2020   Procedure: CORONARY STENT INTERVENTION;  Surgeon: Wellington Hampshire, MD;  Location: [place] [facility];  Service: Cardiovascular;  Laterality: N/A;   CORONARY STENT INTERVENTION N/A 05/20/2021   Procedure: CORONARY STENT INTERVENTION;  Surgeon: Wellington Hampshire, MD;  Location: [place] [facility];  Service: Cardiovascular;  Laterality: N/A;   INTRAVASCULAR ULTRASOUND/IVUS N/A 05/20/2021    Procedure: Intravascular Ultrasound/IVUS;  Surgeon: Wellington Hampshire, MD;  Location: [place] [facility];  Service: Cardiovascular;  Laterality: N/A;   IRRIGATION AND DEBRIDEMENT KNEE Left    LEFT HEART CATH AND CORONARY ANGIOGRAPHY N/A 01/24/2020   Procedure: LEFT HEART CATH AND CORONARY ANGIOGRAPHY;  Surgeon: Wellington Hampshire, MD;  Location: [place] [facility];  Service: Cardiovascular;  Laterality: N/A;   LEFT HEART CATH AND CORONARY ANGIOGRAPHY N/A 11/18/2021   Procedure: LEFT HEART CATH AND CORONARY ANGIOGRAPHY;  Surgeon: Wellington Hampshire, MD;  Location: [place] [facility];  Service: Cardiovascular;  Laterality: N/A;   LEFT HEART CATH AND CORONARY ANGIOGRAPHY N/A 02/13/2022   Procedure: LEFT HEART CATH AND CORONARY ANGIOGRAPHY;  Surgeon: Leonie Man, MD;  Location: [place] [facility];  Service: Cardiovascular;  Laterality: N/A;   LEFT HEART CATH AND CORS/GRAFTS ANGIOGRAPHY N/A 05/20/2021   Procedure: LEFT HEART CATH AND CORS/GRAFTS ANGIOGRAPHY;  Surgeon: Wellington Hampshire, MD;  Location: [place] [facility];  Service: Cardiovascular;  Laterality: N/A;   TYMPANOSTOMY TUBE PLACEMENT Bilateral     Family History: Family History  Problem Relation Age of Onset   Coronary artery disease Mother    Arrhythmia Mother        Pacemaker placed in 2021   Heart failure Mother    Coronary artery disease Father     Social History:  reports that he quit smoking about 2 years ago. His smoking use included cigarettes. He has a 22.00 pack-year smoking history. He has been exposed to tobacco smoke. He has never used  smokeless tobacco. He reports current alcohol use of about 1.0 standard drink of alcohol per week. He reports that he does not use drugs.  Physical Exam: BP 126/85   Pulse (!) 103   Ht 6' (1.829 m)   Wt 217 lb (98.4 kg)   BMI 29.43 kg/m    Constitutional:  Alert and oriented, No acute distress. Cardiovascular: No clubbing, cyanosis, or edema. Respiratory: Normal  respiratory effort, no increased work of breathing. GI: Abdomen is soft, nontender, nondistended, no abdominal masses GU: Circumcised phallus with patent meatus, no lesions  Assessment & Plan:   47 year old male with persistent urinary symptoms of urgency, frequency, weak stream, and straining since having a Foley catheter during hospitalization.  We reviewed possible etiologies including infection, inflammation, urethral stricture disease.  I think is reasonable to try a 10-day course of Bactrim for possible prostatitis/UTI, and will follow-up urine culture, as well as a 2-week course of Flomax.  I recommended follow-up in 4 weeks for cystoscopy to rule out urethral stricture, but could cancel this appointment if symptoms have resolved with the above medications.  Return precautions discussed at length.  Follow-up urine culture, Bactrim DS twice daily x 10 days for possible UTI/prostatitis, Flomax x 2 weeks RTC 1 month for cystoscopy to evaluate urethral stricture, can cancel if symptoms resolve  Nickolas Madrid, MD 03/25/2022  [facility] [address], [place], [place] [address] [phone]

## 2022-03-25 NOTE — Patient Instructions (Signed)

## 2022-03-26 LAB — URINE CULTURE: Culture: NO GROWTH

## 2022-03-26 NOTE — Progress Notes (Deleted)
[address]       [place] [address]             [phone]  HPI: This is a 47 year old with a past medical history of CAD (s/p PCI to LAD in 2021 and repeat intervention mid LAD in 03/23) , HTN, polysubstance abuse (tobacco, cocaine), HTN, noncompliance.  Patient presents today for routine post op follow up after having had a NSTEMI and undergone a CABG x 3 (LIMA to LAD, IABP, and left atrial closure) by Dr. Tenny Craw on 02/20/2023. He was discharged on 03/01/2022. He was evaluated by a urologist on 03/25/2022 for complaints of persistent urinary symptoms of urgency, frequency, weak stream, and straining. He was given Bactrim DS for ten days for possible UTI/prostatitis and Flomax for 2 weeks. A UC was also obtained.    Current Outpatient Medications  Medication Sig Dispense Refill   albuterol (VENTOLIN HFA) 108 (90 Base) MCG/ACT inhaler Inhale 2 puffs into the lungs every 6 (six) hours as needed for wheezing or shortness of breath. 6.7 g 0   aspirin EC 81 MG tablet Take 1 tablet (81 mg total) by mouth daily. Swallow whole. 90 tablet 3   clopidogrel (PLAVIX) 75 MG tablet Take 1 tablet (75 mg total) by mouth daily. 90 tablet 3   dapagliflozin propanediol (FARXIGA) 10 MG TABS tablet Take 1 tablet (10 mg total) by mouth daily. 30 tablet 11   famotidine (PEPCID) 20 MG tablet Take 1 tablet (20 mg total) by mouth 2 (two) times daily. 60 tablet 3   furosemide (LASIX) 40 MG tablet Take 1 tablet (40 mg total) by mouth daily. 90 tablet 3   gabapentin (NEURONTIN) 100 MG capsule Take 2 capsules (200 mg total) by mouth 2 (two) times daily. 30 capsule 1   losartan (COZAAR) 50 MG tablet Take 1 tablet (50 mg total) by mouth daily. 90 tablet 3   metoprolol succinate (TOPROL-XL) 25 MG 24 hr tablet Take 1 tablet (25 mg total) by mouth daily. 90 tablet 3   nitroGLYCERIN (NITROSTAT) 0.4 MG SL tablet Place 1 tablet (0.4 mg total) under the tongue every 5 (five) minutes as needed for chest  pain (if chest pain not resolved after 3 doses, please call 911). 25 tablet 2   rosuvastatin (CRESTOR) 40 MG tablet Take 1 tablet (40 mg total) by mouth daily. 90 tablet 3   spironolactone (ALDACTONE) 25 MG tablet Take 1 tablet (25 mg total) by mouth daily. 90 tablet 3   sulfamethoxazole-trimethoprim (BACTRIM DS) 800-160 MG tablet Take 1 tablet by mouth 2 (two) times daily. 20 tablet 0   tamsulosin (FLOMAX) 0.4 MG CAPS capsule Take 1 capsule (0.4 mg total) by mouth daily. 14 capsule 0  Vital Signs:   Physical Exam: CV- Pulmonary Abdomen- Extremities- Wounds-  Diagnostic Tests: ***  Impression and Plan: You are encouraged to enroll and participate in the outpatient cardiac rehab program beginning as soon as practical. You may return to driving an automobile as long as you are no longer requiring oral narcotic pain relievers during the daytime.  It would be wise to start driving only short distances during the daylight and gradually increase from there as you feel comfortable. Continue to avoid any heavy lifting or strenuous use of your arms or shoulders for at least a total of three months from the time of surgery.  After three months you may gradually increase how much you lift or otherwise use your arms or  chest as tolerated, with limits based upon whether or not activities lead to the return of significant discomfort.     Nani Skillern, PA-C [facility] [phone]

## 2022-03-27 ENCOUNTER — Ambulatory Visit: Payer: Self-pay

## 2022-03-31 NOTE — Telephone Encounter (Signed)
Advanced Heart Failure Patient Advocate Encounter  Contacted [employer] for update; BI is requesting clarification of MD/Rx form. Updated and faxed on 03/31/22

## 2022-04-01 ENCOUNTER — Telehealth: Payer: Self-pay | Admitting: Family

## 2022-04-01 NOTE — Progress Notes (Signed)
Mailed pt the order and he will have done closer to home. [place]

## 2022-04-01 NOTE — Telephone Encounter (Signed)
Attempted to notify patient that he was approved for Jardiance patient assistance thru 03/31/2023 but was unable to reach.   Keigo Whalley, NT

## 2022-04-02 NOTE — Telephone Encounter (Signed)
Advanced Heart Failure Patient Advocate Encounter  Patient was approved to receive Jardiance from [facility] Effective 03/31/22 to 03/31/23 Determination letter has been added to patient chart.

## 2022-04-09 ENCOUNTER — Other Ambulatory Visit: Payer: Self-pay | Admitting: Urology

## 2022-04-10 MED FILL — Heparin Sodium (Porcine) Inj 1000 Unit/ML: Qty: 1000 | Status: AC

## 2022-04-10 MED FILL — Lidocaine HCl Local Preservative Free (PF) Inj 2%: INTRAMUSCULAR | Qty: 14 | Status: AC

## 2022-04-10 MED FILL — Potassium Chloride Inj 2 mEq/ML: INTRAVENOUS | Qty: 40 | Status: AC

## 2022-04-11 MED FILL — Sodium Bicarbonate IV Soln 8.4%: INTRAVENOUS | Qty: 100 | Status: AC

## 2022-04-11 MED FILL — Heparin Sodium (Porcine) Inj 1000 Unit/ML: INTRAMUSCULAR | Qty: 30 | Status: AC

## 2022-04-11 MED FILL — Electrolyte-R (PH 7.4) Solution: INTRAVENOUS | Qty: 6000 | Status: AC

## 2022-04-11 MED FILL — Sodium Chloride IV Soln 0.9%: INTRAVENOUS | Qty: 5000 | Status: AC

## 2022-04-11 MED FILL — Mannitol IV Soln 20%: INTRAVENOUS | Qty: 500 | Status: AC

## 2022-04-14 ENCOUNTER — Other Ambulatory Visit
Admission: RE | Admit: 2022-04-14 | Discharge: 2022-04-14 | Disposition: A | Discharge status: Home / Self Care | Payer: Medicaid Other | Source: Ambulatory Visit | Attending: Internal Medicine | Admitting: Internal Medicine

## 2022-04-14 ENCOUNTER — Ambulatory Visit (HOSPITAL_BASED_OUTPATIENT_CLINIC_OR_DEPARTMENT_OTHER): Payer: Medicaid Other | Admitting: Internal Medicine

## 2022-04-14 ENCOUNTER — Telehealth: Payer: Self-pay | Admitting: *Deleted

## 2022-04-14 ENCOUNTER — Other Ambulatory Visit: Payer: Self-pay | Admitting: Urgent Care

## 2022-04-14 ENCOUNTER — Encounter: Payer: Self-pay | Admitting: Internal Medicine

## 2022-04-14 ENCOUNTER — Other Ambulatory Visit: Payer: Self-pay | Admitting: *Deleted

## 2022-04-14 VITALS — BP 113/74 | Resp 18 | Ht 72.0 in | Wt 227.1 lb

## 2022-04-14 DIAGNOSIS — J90 Pleural effusion, not elsewhere classified: Secondary | ICD-10-CM | POA: Diagnosis not present

## 2022-04-14 DIAGNOSIS — I5033 Acute on chronic diastolic (congestive) heart failure: Secondary | ICD-10-CM | POA: Insufficient documentation

## 2022-04-14 DIAGNOSIS — I2510 Atherosclerotic heart disease of native coronary artery without angina pectoris: Secondary | ICD-10-CM | POA: Diagnosis not present

## 2022-04-14 DIAGNOSIS — K219 Gastro-esophageal reflux disease without esophagitis: Secondary | ICD-10-CM

## 2022-04-14 DIAGNOSIS — R81 Glycosuria: Secondary | ICD-10-CM

## 2022-04-14 DIAGNOSIS — I10 Essential (primary) hypertension: Secondary | ICD-10-CM | POA: Diagnosis not present

## 2022-04-14 LAB — BRAIN NATRIURETIC PEPTIDE: B Natriuretic Peptide: 48.9 pg/mL (ref 0.0–100.0)

## 2022-04-14 LAB — BASIC METABOLIC PANEL
Anion gap: 6 (ref 5–15)
BUN: 13 mg/dL (ref 6–20)
CO2: 21 mmol/L — ABNORMAL LOW (ref 22–32)
Calcium: 8.7 mg/dL — ABNORMAL LOW (ref 8.9–10.3)
Chloride: 109 mmol/L (ref 98–111)
Creatinine, Ser: 1.28 mg/dL — ABNORMAL HIGH (ref 0.61–1.24)
GFR, Estimated: >60 mL/min (ref >60)
Glucose, Bld: 85 mg/dL (ref 70–99)
Potassium: 4.1 mmol/L (ref 3.5–5.1)
Sodium: 136 mmol/L (ref 135–145)

## 2022-04-14 LAB — HEMOGLOBIN A1C
Hgb A1c MFr Bld: 5.2 % (ref 4.8–5.6)
Mean Plasma Glucose: 102.54 mg/dL

## 2022-04-14 NOTE — Progress Notes (Signed)
Advanced Heart Failure Clinic Note   Referring Physician: Dr. Garen Lah PCP: Renee Rival, FNP PCP-Cardiologist: Kate Sable, MD   HPI:  Logan Crawford is a 47 y.o. male with HTN, polysubstance abuse, tobacco use disorder, CAD s/p PCI in 04/2021, CABG in 12/23.   Hospitalized from 9/23 with NSTEMI. Cath on 9/25 showing significant two-vessel coronary artery disease which was  treated medically and on DAPT. Readmitted 12/23 with NSTEMI.  Cath 02/13/22 which showed recurrent in-stent restenosis in the LAD in the setting of continued cocaine use and poor compliance with antiplatelet medications.  Patient also has significant progression of the RCA disease.  Pre-op EF normal.   Underwent CAG 02/19/22  (LIMA - Diag, SVG - LAD, SVG - RCA). Operative course complicated by severe hyperkalemia (prior to cardiopulmonary bypass with question of malignant hyperthermia syndrome - received dantrolene). Early post-op course was notable for shock physiology and AKI. Required IABP and multiple gtts which were eventually weaned ECHO EF 55-60%. Also had some mild sternal drainage treated with abx Discharged 03/01/22.   I saw him last month and was volume overloaded with large left pleural effusion. Underwent tap with IR on 03/10/22 with 750cc out. Started on Furoscix  Saw General Cards 1/22 with concern for recurrent effusion. Stat CXR with trace effusion.   Here for f/u. Breathing is better but not back to baseline. Occasioanl chest tightness. Main complaint is trouble emptying his bladder. Following with urology. Says he is out of some of his meds (doesn't know which ones. But is refilling today).  No further drainage from chest incision.   ReDS 48% -> 40%    Past Medical History:  Diagnosis Date   CAD (coronary artery disease)    a. 11/2019 NSTEMI/PCI: LM min irregs, LAD 59m(2.75x22 Resolute Onyx DES), 40d, D2 min irregs, LCX/LPAV min irregs, RCA 60ost, 40p/m, EF 45-50%; b. 11/2020 MV: EF 43%,  small area of apical ischemia ->? over processing-->low risk.   Hyperlipidemia    Hypertension    Ischemic cardiomyopathy    a. 11/2019 LV Gram: EF 45-50%; b. 12/2019 Echo: EF 50% w/ mod mid-apical anteroseptal HK, GrI DD, nl RV fxn, mild Ao sclerosis.   Tobacco use 11/25/2019    Current Outpatient Medications  Medication Sig Dispense Refill   albuterol (VENTOLIN HFA) 108 (90 Base) MCG/ACT inhaler Inhale 2 puffs into the lungs every 6 (six) hours as needed for wheezing or shortness of breath. 6.7 g 0   aspirin EC 81 MG tablet Take 1 tablet (81 mg total) by mouth daily. Swallow whole. 90 tablet 3   clopidogrel (PLAVIX) 75 MG tablet Take 1 tablet (75 mg total) by mouth daily. 90 tablet 3   dapagliflozin propanediol (FARXIGA) 10 MG TABS tablet Take 1 tablet (10 mg total) by mouth daily. 30 tablet 11   gabapentin (NEURONTIN) 100 MG capsule Take 2 capsules (200 mg total) by mouth 2 (two) times daily. 30 capsule 1   losartan (COZAAR) 50 MG tablet Take 1 tablet (50 mg total) by mouth daily. 90 tablet 3   metoprolol succinate (TOPROL-XL) 25 MG 24 hr tablet Take 1 tablet (25 mg total) by mouth daily. 90 tablet 3   nitroGLYCERIN (NITROSTAT) 0.4 MG SL tablet Place 1 tablet (0.4 mg total) under the tongue every 5 (five) minutes as needed for chest pain (if chest pain not resolved after 3 doses, please call 911). 25 tablet 2   famotidine (PEPCID) 20 MG tablet Take 1 tablet (20 mg total) by mouth  2 (two) times daily. (Patient not taking: Reported on 04/14/2022) 60 tablet 3   furosemide (LASIX) 40 MG tablet Take 1 tablet (40 mg total) by mouth daily. (Patient not taking: Reported on 04/14/2022) 90 tablet 3   rosuvastatin (CRESTOR) 40 MG tablet Take 1 tablet (40 mg total) by mouth daily. (Patient not taking: Reported on 04/14/2022) 90 tablet 3   spironolactone (ALDACTONE) 25 MG tablet Take 1 tablet (25 mg total) by mouth daily. 90 tablet 3   No current facility-administered medications for this visit.    No  Known Allergies    Social History   Socioeconomic History   Marital status: Single    Spouse name: Not on file   Number of children: 3   Years of education: Not on file   Highest education level: Not on file  Occupational History   Not on file  Tobacco Use   Smoking status: Former    Packs/day: 1.00    Years: 22.00    Total pack years: 22.00    Types: Cigarettes    Quit date: 01/23/2020    Years since quitting: 2.2    Passive exposure: Past   Smokeless tobacco: Never   Tobacco comments:    Smokes 1-2 cigarettes daily    Vaping Use   Vaping Use: Never used  Substance and Sexual Activity   Alcohol use: Yes    Alcohol/week: 1.0 standard drink of alcohol    Types: 1 Shots of liquor per week    Comment: socially   Drug use: No   Sexual activity: Yes  Other Topics Concern   Not on file  Social History Narrative   Lives with his uncle    Social Determinants of Health   Financial Resource Strain: Not on file  Food Insecurity: No Food Insecurity (02/17/2022)   Hunger Vital Sign    Worried About Running Out of Food in the Last Year: Never true    Ran Out of Food in the Last Year: Never true  Transportation Needs: No Transportation Needs (02/17/2022)   PRAPARE - Hydrologist (Medical): No    Lack of Transportation (Non-Medical): No  Physical Activity: Not on file  Stress: Not on file  Social Connections: Not on file  Intimate Partner Violence: Not At Risk (02/17/2022)   Humiliation, Afraid, Rape, and Kick questionnaire    Fear of Current or Ex-Partner: No    Emotionally Abused: No    Physically Abused: No    Sexually Abused: No      Family History  Problem Relation Age of Onset   Coronary artery disease Mother    Arrhythmia Mother        Pacemaker placed in 2021   Heart failure Mother    Coronary artery disease Father     Vitals:   04/14/22 1126  BP: 113/74  Resp: 18  SpO2: 97%  Weight: 227 lb 2 oz (103 kg)  Height: 6'  (1.829 m)    Wt Readings from Last 3 Encounters:  04/14/22 227 lb 2 oz (103 kg)  03/25/22 217 lb (98.4 kg)  03/17/22 222 lb 8 oz (100.9 kg)     PHYSICAL EXAM: General:  Sitting in chair. Breathing heavily  HEENT: normal Neck: supple. JVP 5-6  Carotids 2+ bilat; no bruits. No lymphadenopathy or thryomegaly appreciated. Cor: PMI nondisplaced. Regular rate & rhythm. No rubs, gallops or murmurs. Lungs: clear Abdomen: soft, nontender, nondistended. No hepatosplenomegaly. No bruits or masses. Good bowel sounds.  Extremities: no cyanosis, clubbing, rash, edema Neuro: alert & orientedx3, cranial nerves grossly intact. moves all 4 extremities w/o difficulty. Affect pleasant   ASSESSMENT & PLAN:  1. Chronic diastolic HF - S/p CABG [id number].  - Echo 12/29 with EF 50-55%, mild AI, mild MR.  - Bedside echo 03/07/22 in clinic LVEF 60% RV ok. No pericardial effusion - Symptoms improving but still volume overloaded. - ReDS 40% (down from 48%)  - He is unaware of what meds he is on but he will call the office later today to discuss. Needs to continue below meds including diuretic - Continue 25 mg daily Toprol.  - Continue losartan 25 mg daily  - Continue spironolactone to 25 mg daily.  - Continue Farxiga 10 mg daily.   - Labs today   2. CAD with NSTEMI -Hx prior PCI/stent to LAD in 2021 and PCI/stent to LAD in 03/23 d/t in-stent restenosis -NSTEMI 12/23 S/p CABG X 3 on 02/19/22 (LIMA to Diag, SVG to LAD, SVG to RCA) -no s/s angina. Continue DAPT - Refer CR   3. CKD 3a -Baseline Scr 1.3-1.6 - labs today   4. Polysubstance abuse -Tobacco and cocaine -UDS + for cocaine on admit   5. Upper sternal drainage - Resolved.   6. left pleural effusion - s/p tap 1/24 - f/u CXR with resolution   7. SDOH: -Hx noncompliance and substance abuse -Uninsured. Will need to engage TOC CSW/CM.   8. Urinary retention - f/u with Urology for cystoscopy   Glori Bickers, MD 04/14/22

## 2022-04-14 NOTE — Patient Instructions (Signed)
Medication Changes:  Please call us this afternoon once you are home to review your medications  Lab Work:  Labs done today, your results will be available in MyChart, we will contact you for abnormal readings.    Referrals:   Plan for referral to Pulmonary Rehab pending insurance approval.   Special Instructions // Education:  Do the following things EVERYDAY: Weigh yourself in the morning before breakfast. Write it down and keep it in a log. Take your medicines as prescribed Eat low salt foods--Limit salt (sodium) to 2000 mg per day.  Stay as active as you can everyday Limit all fluids for the day to less than 2 liters   Follow-Up in: 3 weeks with Dr. Haroldine Laws    If you have any questions or concerns before your next appointment please send [place] a message through mychart or call our office at [phone] Monday-Friday 8 am-5 pm.   If you have an urgent need after hours on the weekend please call your Primary Cardiologist or the [facility] in [place] at [phone].

## 2022-04-14 NOTE — Telephone Encounter (Signed)
Pt called to report medications he is currently taking. Pt said for the last week he has inly taken rosuvastatin 32m daily, an OTC stool softener, Jardiance 119mdaily, Losartan 5057maily, and Aspirin 48m75mily. Pt said he was told to report meds to Dr.Bensimhon.

## 2022-04-14 NOTE — Progress Notes (Signed)
ReDS Vest / Clip - 04/14/22 1126       ReDS Vest / Clip   Station Marker C    Ruler Value 29    ReDS Value Range Moderate volume overload    ReDS Actual Value 40

## 2022-04-15 ENCOUNTER — Encounter: Payer: Self-pay | Admitting: Urology

## 2022-04-15 ENCOUNTER — Ambulatory Visit (INDEPENDENT_AMBULATORY_CARE_PROVIDER_SITE_OTHER): Payer: Self-pay | Admitting: Urology

## 2022-04-15 ENCOUNTER — Other Ambulatory Visit: Payer: Self-pay

## 2022-04-15 VITALS — BP 125/84 | HR 123 | Ht 72.0 in | Wt 226.2 lb

## 2022-04-15 DIAGNOSIS — R399 Unspecified symptoms and signs involving the genitourinary system: Secondary | ICD-10-CM

## 2022-04-15 DIAGNOSIS — R3129 Other microscopic hematuria: Secondary | ICD-10-CM

## 2022-04-15 DIAGNOSIS — N35819 Other urethral stricture, male, unspecified site: Secondary | ICD-10-CM

## 2022-04-15 DIAGNOSIS — R39198 Other difficulties with micturition: Secondary | ICD-10-CM

## 2022-04-15 DIAGNOSIS — R300 Dysuria: Secondary | ICD-10-CM

## 2022-04-15 DIAGNOSIS — R3912 Poor urinary stream: Secondary | ICD-10-CM

## 2022-04-15 MED ORDER — LIDOCAINE HCL URETHRAL/MUCOSAL 2 % EX GEL
1.0000 | Freq: Once | CUTANEOUS | Status: AC
  Administered 2022-04-15: 1 via URETHRAL

## 2022-04-15 NOTE — Progress Notes (Signed)
Cystoscopy/urethral dilation procedure Note:  Indication: Weak stream, dysuria, microscopic hematuria  He was prepped and draped in standard sterile fashion and informed consent obtained.  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  Informed consent obtained, gentle dilation performed with a curved hemostat.  I was then able to perform urethroscopy.  No other evidence of urethral stricture disease, patient did not tolerate attempt to advance cystoscope past the prostate secondary to discomfort and procedure was terminated based on the obvious fossa navicularis stricture, and low likelihood of abnormalities within the prostate or bladder.  Findings: Pinpoint fossa navicularis stricture  Assessment and Plan: Instructed to perform daily distal urethral dilation with catheter x 1 month, RTC 6 weeks symptom check, return precautions discussed extensively  Nickolas Madrid, MD 04/15/2022

## 2022-04-15 NOTE — Patient Instructions (Addendum)
You had a small stricture at the tip of the penis, this is called a fossa navicularis stricture.  Pass a catheter 1 time daily over the next month to help keep this from closing down again.  You can use any kind of over-the-counter lubrication on the catheter to make it easier.  You will need to pass the catheter in about 2 inches.  You can take Azo(Pyridium) over-the-counter for any burning with urination, this will cause the urination to be orange.

## 2022-04-16 NOTE — Progress Notes (Signed)
Cardiology Office Note    Date:  04/25/2022   ID:  Logan Crawford, Logan Crawford Mar 01, 1975, MRN [id number]  PCP:  Renee Rival, FNP  Cardiologist:  Kate Sable, MD  Electrophysiologist:  None   Chief Complaint: Follow-up  History of Present Illness:   Logan Crawford is a 47 y.o. male with history of CAD with NSTEMI status post PCI/DES to the LAD in 12/2019 status post PCI to the LAD due to in-stent restenosis status post three-vessel CABG on 02/19/2022, medication nonadherence, CKD stage III, HTN, HLD, and polysubstance use including cocaine and tobacco use who presents for follow-up of his CAD and cardiomyopathy.   He was admitted to the hospital in 11/2019 with progressive weakness, fatigue, dyspnea, cough, and diarrhea.  He was found to be COVID-positive.  High-sensitivity troponin peaked at 32.  Echo showed an EF of 50 to 55%, no regional wall motion abnormalities, grade 1 diastolic dysfunction, normal RV systolic function and ventricular cavity size, and no significant valvular abnormalities.  He was readmitted to the hospital in 12/2019 with an NSTEMI with troponin peaking at 3520.  Echo at that time showed an EF of 50%, moderate hypokinesis of the mid apical anteroseptal wall, mild LVH, diastolic dysfunction, normal RV systolic function and ventricular cavity size, and mild aortic valve sclerosis without evidence of stenosis.  LHC showed severe one-vessel CAD with 90% stenosis in the mid LAD which was felt to be the likely culprit for the NSTEMI.  There was also 60% ostial to proximal RCA stenosis, 40% proximal to mid RCA stenosis, and 40% distal LAD stenosis.  LVEF was 45% with moderate to severe anterior apical hypokinesis.  He underwent successful PCI/DES to the mid LAD.  Lexiscan MPI in 11/2020 showed a small region of ischemia in the apical wall versus concern for over a processing, and was overall low risk.  He was admitted to the hospital in 04/2021 with chest pain with troponin  peaking at 175.  Urine drug screen positive for cocaine.  Echo showed an EF of 50 to 55%, hypokinesis of the mid to distal anteroseptal/septal and apical region, moderate LVH, grade 1 diastolic dysfunction, normal RV systolic function and ventricular cavity size, mild mitral regurgitation, aortic valve sclerosis without evidence of stenosis, and an estimated right atrial pressure of 3 mmHg.  LHC showed severe one-vessel CAD due to distal edge restenosis of the previously placed stent in the LAD.  In addition, there was extensive plaque distal to the stent involving the whole mid segment.  There was stable moderate RCA disease.  He underwent successful complex IVUS guided bifurcation angioplasty and DES placement to the mid LAD.  IVUS before PCI showed underexpansion distally as well as extensive plaque in distal to the stent.  No stent fractures.  Another stent was added distally due to suspected distal edge dissection.  PTCA was performed in the ostial D2 before placing the stent.  Echo in 06/2021 demonstrated an EF of 60 to 65%, no regional wall motion abnormalities, moderate LVH, grade 2 diastolic dysfunction, normal RV systolic function and ventricular cavity size, mild mitral regurgitation, and an estimated right atrial pressure of 3 mmHg.  He was admitted in 10/2021 with unstable angina.  He was not taking his medications.  Urine drug screen was positive for cocaine.  Echo showed an EF of 60 to 65%, no regional wall motion abnormalities, moderate concentric LVH, grade 1 diastolic dysfunction, normal RV systolic function and ventricular cavity size, trivial mitral regurgitation, aortic  valve sclerosis without evidence of stenosis, and an estimated right atrial pressure of 3 mmHg.  LHC showed significant two-vessel CAD.  Patent overlapped LAD stents with evidence of significant in-stent restenosis in the proximal segment.  In addition, there was borderline 60% stenosis distal to the stent.  There was known  moderate RCA disease, though the distal stenosis appeared worse.  Intracoronary nitroglycerin was given which improved the overall appearance of the coronary arteries.  Normal LV systolic function with moderately elevated LVEDP.  Intervention was deferred at that time given overall difficult situation due to patient poor compliance and medical therapy and continued cocaine use.  After further discussion with the patient, the decision was made to optimize antianginal therapy with plan for outpatient follow-up and reconsideration of revascularization if he remained symptomatic and adherent to medications.   He was admitted to the hospital in 01/2022 with an NSTEMI in the setting of continued cocaine use and nonadherence with antiplatelet therapy.  Echo showed an EF of 60 to 65%, no regional wall motion maladies, normal RV systolic function and ventricular cavity size, and mild mitral regurgitation.  LHC showed 2 potential culprit lesions including mid to distal RCA ulcerated 95% stenosis and mid to distal LAD stent edge 99% stenosis as well as moderate disease as outlined below.  He was transferred to [facility] and underwent T CTS evaluation with recommendation for CABG.  He underwent successful three-vessel CABG (LIMA to diagonal, SVG to LAD, and SVG to RCA) with intraoperative course being complicated by hyperkalemia and bradycardic arrest prior to initiation of cardiopulmonary bypass with requirement of intraoperative aortic balloon pump as well as inotropic and vasopressor support.  Postoperative clinical course was notable for continued vasopressor and inotropic support including epinephrine, Levophed, vasopressin as well as milrinone and intra-aortic balloon pump.  While intubated he showed significant evidence of agitation requiring adjustments in narcotics and sedatives.  There was also some AKI, peaking at 2.5, that improved over time.  The etiology of his hyperkalemia was not entirely clear with some  concern this could have been atypical malignant hyperthermia.  He required significant diuresis and had expected postoperative blood loss.  Toxicology was positive for cocaine during the admission.  There was a significant amount of sternal drainage from his sternotomy.  Follow-up echo prior to discharge showed an EF of 50 to 55%, mild LVH, grade 2 diastolic dysfunction, elevated LVEDP, normal RV systolic function and ventricular cavity size, mild mitral regurgitation, mild calcification on the aortic valve with mild insufficiency and no evidence of aortic stenosis, mildly dilated aortic root measuring 36 mm, and an estimated right atrial pressure of 3 mmHg.   Posthospitalization course has been complicated by left pleural effusion requiring thoracentesis, with 750 mL of thin dark red fluid removed, on 03/10/2022.  He has been followed by the advanced heart failure service and required Furoscix.  He was seen by general cardiology on 03/17/2022 noting a return of dyspnea with 4-5 pillow orthopnea.  His weight was down by 3 pounds when compared to his office weight in 10/2021.  He was abstaining from substance use.  He reported adherence to medications.  He felt like he was having difficulty emptying his bladder.  Stat chest x-ray that day showed a persistent bandlike opacity at the left lung base felt to likely represent atelectasis with a trace left pleural effusion.  Echo on 03/19/2022 showed an EF of 65 to 70%, no regional wall motion abnormalities, mild LVH, grade 1 diastolic dysfunction, normal RV  systolic function, aortic valve sclerosis without evidence of stenosis, and an estimated right atrial pressure of 3 mmHg.  He followed up with the advanced heart failure service on 04/14/2022 noting an improvement in his breathing, though not back to baseline.  His main complaint was trouble emptying his bladder.  He reported being out of some of his medications, though was unclear which ones.  ReDs vest improved to 40%  from 48%.  BNP at that time normal at 48.  Upon returning home, he called their office to report he was taking rosuvastatin 40 mg, Jardiance 10 mg, losartan 50 mg, ASA 81 mg, and an OTC stool softener.  He was advised by heart failure to restart furosemide 40 mg daily.  He presents today for follow-up of his coronary artery disease and heart failure.  He states he is feeling well, the best he has felt since his surgery in December.  He declined cardiac rehab secondary to no insurance, he has been recently playing pool and stretching.  He continues to endorse some confusion over the medications he is taking as several have been stopped and restarted.  He states he is taking the medications that he has at home as prescribed, he is just not able to verbalize what those medications are.  He did run out of his Crestor approximately 2 months ago, and has not taken it since.  His weight is down today to 223, this appears to be close to his dry weight, recent visit at the heart failure clinic his weight was 226.  He continues to have some DOE, it is not clear if this is related to his heart failure, deconditioning, or pulmonary dysfunction.  He is using his rescue albuterol inhaler several times a day, on most days.  He states he just feels like he cannot take a deep breath. He denies chest pain, palpitations, pnd, orthopnea, n, v, dizziness, syncope, edema, weight gain, or early satiety.    Labs independently reviewed: 03/2022 - A1c 5.2, potassium 4.1, BUN 13, serum creatinine 1.28 02/2022 - hemoglobin 12.3, PLT 883, BNP 103, magnesium 2.0 01/2022 - albumin 3.1, AST/ALT normal 10/2021 - TC 179, TG 284, HDL 29, LDL 93, LP(a) 50 04/2021 - A1c 5.8 07/2017 - TSH normal  Past Medical History:  Diagnosis Date   CAD (coronary artery disease)    a. 11/2019 NSTEMI/PCI: LM min irregs, LAD 68m(2.75x22 Resolute Onyx DES), 40d, D2 min irregs, LCX/LPAV min irregs, RCA 60ost, 40p/m, EF 45-50%; b. 11/2020 MV: EF 43%, small  area of apical ischemia ->? over processing-->low risk.   Hyperlipidemia    Hypertension    Ischemic cardiomyopathy    a. 11/2019 LV Gram: EF 45-50%; b. 12/2019 Echo: EF 50% w/ mod mid-apical anteroseptal HK, GrI DD, nl RV fxn, mild Ao sclerosis.   Tobacco use 11/25/2019    Past Surgical History:  Procedure Laterality Date   CORONARY ARTERY BYPASS GRAFT N/A 02/19/2022   Procedure: CORONARY ARTERY BYPASS GRAFTING (CABG) X THREE, USING ENDOSCOPICALLY HARVESTED RIGHT GREATER SAPHENOUS VEIN  AND LEFT ATRIAL APPENDAGE CLIPPING;  Surgeon: ENeomia Glass MD;  [facility]  Service: Open Heart Surgery;  Laterality: N/A;   CORONARY STENT INTERVENTION N/A 01/24/2020   Procedure: CORONARY STENT INTERVENTION;  Surgeon: AWellington Hampshire MD;  [facility]  Service: Cardiovascular;  Laterality: N/A;   CORONARY STENT INTERVENTION N/A 05/20/2021   Procedure: CORONARY STENT INTERVENTION;  Surgeon: AWellington Hampshire MD;  [facility];  Service: Cardiovascular;  Laterality: N/A;   INTRAVASCULAR ULTRASOUND/IVUS N/A 05/20/2021   Procedure: Intravascular Ultrasound/IVUS;  Surgeon: Wellington Hampshire, MD;  Location: [place] [facility];  Service: Cardiovascular;  Laterality: N/A;   IRRIGATION AND DEBRIDEMENT KNEE Left    LEFT HEART CATH AND CORONARY ANGIOGRAPHY N/A 01/24/2020   Procedure: LEFT HEART CATH AND CORONARY ANGIOGRAPHY;  Surgeon: Wellington Hampshire, MD;  Location: [place] [facility];  Service: Cardiovascular;  Laterality: N/A;   LEFT HEART CATH AND CORONARY ANGIOGRAPHY N/A 11/18/2021   Procedure: LEFT HEART CATH AND CORONARY ANGIOGRAPHY;  Surgeon: Wellington Hampshire, MD;  Location: [place] [facility];  Service: Cardiovascular;  Laterality: N/A;   LEFT HEART CATH AND CORONARY ANGIOGRAPHY N/A 02/13/2022   Procedure: LEFT HEART CATH AND CORONARY ANGIOGRAPHY;  Surgeon: Leonie Man, MD;  Location: [place] [facility];  Service: Cardiovascular;  Laterality: N/A;    LEFT HEART CATH AND CORS/GRAFTS ANGIOGRAPHY N/A 05/20/2021   Procedure: LEFT HEART CATH AND CORS/GRAFTS ANGIOGRAPHY;  Surgeon: Wellington Hampshire, MD;  Location: [place] [facility];  Service: Cardiovascular;  Laterality: N/A;   TYMPANOSTOMY TUBE PLACEMENT Bilateral     Current Medications: Current Meds  Medication Sig   aspirin EC 81 MG tablet Take 1 tablet (81 mg total) by mouth daily. Swallow whole.   clopidogrel (PLAVIX) 75 MG tablet Take 1 tablet (75 mg total) by mouth daily.   empagliflozin (JARDIANCE) 10 MG TABS tablet Take 10 mg by mouth daily.   famotidine (PEPCID) 20 MG tablet Take 1 tablet (20 mg total) by mouth 2 (two) times daily.   furosemide (LASIX) 40 MG tablet Take 1 tablet (40 mg total) by mouth daily.   gabapentin (NEURONTIN) 100 MG capsule Take 2 capsules (200 mg total) by mouth 2 (two) times daily.   losartan (COZAAR) 50 MG tablet Take 1 tablet (50 mg total) by mouth daily.   metoprolol succinate (TOPROL-XL) 25 MG 24 hr tablet Take 1 tablet (25 mg total) by mouth daily.   nitroGLYCERIN (NITROSTAT) 0.4 MG SL tablet Place 1 tablet (0.4 mg total) under the tongue every 5 (five) minutes as needed for chest pain (if chest pain not resolved after 3 doses, please call 911).   spironolactone (ALDACTONE) 25 MG tablet Take 1 tablet (25 mg total) by mouth daily.   [DISCONTINUED] albuterol (VENTOLIN HFA) 108 (90 Base) MCG/ACT inhaler Inhale 2 puffs into the lungs every 6 (six) hours as needed for wheezing or shortness of breath.   [DISCONTINUED] rosuvastatin (CRESTOR) 40 MG tablet Take 1 tablet (40 mg total) by mouth daily.    Allergies:   Patient has no known allergies.   Social History   Socioeconomic History   Marital status: Single    Spouse name: Not on file   Number of children: 3   Years of education: Not on file   Highest education level: Not on file  Occupational History   Not on file  Tobacco Use   Smoking status: Former    Packs/day: 1.00    Years: 22.00     Total pack years: 22.00    Types: Cigarettes    Quit date: 01/23/2020    Years since quitting: 2.2    Passive exposure: Past   Smokeless tobacco: Never   Tobacco comments:    Smokes 1-2 cigarettes daily    Vaping Use   Vaping Use: Never used  Substance and Sexual Activity   Alcohol use: Yes    Alcohol/week: 1.0 standard drink  of alcohol    Types: 1 Shots of liquor per week    Comment: socially   Drug use: No   Sexual activity: Yes  Other Topics Concern   Not on file  Social History Narrative   Lives with his uncle    Social Determinants of Health   Financial Resource Strain: Not on file  Food Insecurity: No Food Insecurity (02/17/2022)   Hunger Vital Sign    Worried About Running Out of Food in the Last Year: Never true    Ran Out of Food in the Last Year: Never true  Transportation Needs: No Transportation Needs (02/17/2022)   PRAPARE - Hydrologist (Medical): No    Lack of Transportation (Non-Medical): No  Physical Activity: Not on file  Stress: Not on file  Social Connections: Not on file     Family History:  The patient's family history includes Arrhythmia in his mother; Coronary artery disease in his father and mother; Heart failure in his mother.  ROS:   12-point review of systems is negative unless otherwise noted in HPI.   EKGs/Labs/Other Studies Reviewed:    Studies reviewed were summarized above. The additional studies were reviewed today:  2D echo 03/19/2022: 1. Left ventricular ejection fraction, by estimation, is 65 to 70%. The  left ventricle has normal function. The left ventricle has no regional  wall motion abnormalities. There is mild left ventricular hypertrophy.  Left ventricular diastolic parameters  are consistent with Grade I diastolic dysfunction (impaired relaxation).   2. Right ventricular systolic function is normal. The right ventricular  size is not well visualized.   3. The mitral valve is normal in  structure. No evidence of mitral valve  regurgitation.   4. The aortic valve was not well visualized. Aortic valve regurgitation  is not visualized. Aortic valve sclerosis/calcification is present,  without any evidence of aortic stenosis.   5. The inferior vena cava is normal in size with greater than 50%  respiratory variability, suggesting right atrial pressure of 3 mmHg.  __________  2D echo 02/21/2022: 1. Thickening of the aortic vavle and mitral valve was compared to prior  transthoracic echoes and is unchanged. It was not present on TEE, and is  there likely artifactual.   2. Left ventricular ejection fraction, by estimation, is 50 to 55%. The  left ventricle has low normal function. The left ventricle demonstrates  regional wall motion abnormalities (see scoring diagram/findings for  description). There is mild left  ventricular hypertrophy of the lateral segment. Left ventricular diastolic  parameters are consistent with Grade II diastolic dysfunction  (pseudonormalization). Elevated left ventricular end-diastolic pressure.   3. Right ventricular systolic function is normal. The right ventricular  size is normal.   4. The mitral valve is normal in structure. Mild mitral valve  regurgitation. No evidence of mitral stenosis.   5. The aortic valve has an indeterminant number of cusps. There is mild  calcification of the aortic valve. There is mild thickening of the aortic  valve. Aortic valve regurgitation is mild. No aortic stenosis is present.   6. Aortic dilatation noted. There is mild dilatation of the aortic root,  measuring 36 mm.   7. The inferior vena cava is normal in size with greater than 50%  respiratory variability, suggesting right atrial pressure of 3 mmHg.  __________   Intraoperative TEE 02/19/2022: POST-OP IMPRESSIONS  _ Left Ventricle: The left ventricle appeared globally hypokenetic upon  seperation from cardiopulmonary bypass. In  time it appears  unchanged from  pre-bypass.  _ Right Ventricle: The right ventricle appears unchanged from pre-bypass.  _ Aorta: A balloon pump is in place with the tip in the proximal  descending  aorta.  _ Aortic Valve: The aortic valve appears unchanged from pre-bypass.  _ Mitral Valve: The mitral valve appears unchanged from pre-bypass.  _ Tricuspid Valve: The tricuspid valve appears unchanged from pre-bypass.  _ Pulmonic Valve: The pulmonic valve appears unchanged from pre-bypass.  _ Interatrial Septum: The interatrial septum appears unchanged from  pre-bypass.  _ Pericardium: The pericardium appears unchanged from pre-bypass.  __________   Pre-CABG vascular imaging 02/18/2022: Summary:  Right Carotid: Velocities in the right ICA are consistent with a 1-39%  stenosis.   Left Carotid: Velocities in the left ICA are consistent with a 1-39%  stenosis.  Vertebrals: Bilateral vertebral arteries demonstrate antegrade flow.   Right ABI: Normal waveforms.  Left ABI: Normal waveforms.  Right Upper Extremity: Doppler waveforms remain within normal limits with  right radial compression. Doppler waveform obliterate with right ulnar  compression.  Left Upper Extremity: Doppler waveforms remain within normal limits with  left radial compression. Doppler waveforms remain within normal limits  with left ulnar compression.  __________   Limited echo 02/18/2022: 1. Small area of distal septal/inferior basal hypokinesis . Left  ventricular ejection fraction, by estimation, is 55%. The left ventricle  has normal function. The left ventricle has no regional wall motion  abnormalities. There is mild left ventricular  hypertrophy. Left ventricular diastolic parameters are consistent with  Grade I diastolic dysfunction (impaired relaxation).   2. Right ventricular systolic function is normal. The right ventricular  size is normal.   3. The mitral valve is abnormal. Mild mitral valve regurgitation. No   evidence of mitral stenosis.   4. The aortic valve is tricuspid. Aortic valve regurgitation is not  visualized. No aortic stenosis is present.   5. The inferior vena cava is normal in size with greater than 50%  respiratory variability, suggesting right atrial pressure of 3 mmHg.  __________   LHC 02/13/2022:   Proximal to distal LAD stents: Prox LAD to Mid LAD lesion is 70% stenosed. Jailed 2nd Diag lesion is 80% stenosed.   Mid LAD-1 lesion is 80% stenosed. Mid LAD-2 lesion is 50% stenosed.  (All ISR).  Dist LAD lesion is 99% stenosed (at distal stent edge)   Prox RCA-1 lesion is 70% stenosed. Prox RCA-2 lesion is 70% stenosed.   Mid RCA-1 lesion is 70% stenosed.  Mid RCA-2 lesion is 95% stenosed (ulcerated flat lesion)   RV Branch lesion is 60% stenosed.   RPDA lesion is 60% stenosed.   LV end diastolic pressure is severely elevated.   There is no aortic valve stenosis.   Progression of Severe Multivessel: - 2 potential culprit lesions: distal-mid RCA ulcerated 95% ruptured plaque (mid #2), distal mid LAD stent edge 99% with diffuse disease elsewhere RCA:-proximal #1 70%, followed by proximal #2 70%, mid #1 1 70% and mid #2 95%, ostial PDA 60% LAD: Long stented area with proximal concentric 70% are, jailed D2 85% Zio, distal stent focal 80% followed by segmental 50% then 99% at distal edge. LCx-small OM1 and OM 2 with bifurcating OM 3, AVG circumflex with small branches      Recommendation: Restart IV heparin 2 hours after sheath removal.  CVTS consultation which would mean transferred to [place] and cardiology team was notified.  Transfer process has been  initiated pending bed   He was on IV NTG upon arrival, but this was discontinued and he has been pain-free.  May need to restart. __________   Limited echo 02/12/2022: 1. Left ventricular ejection fraction, by estimation, is 60 to 65%. The  left ventricle has normal function. The left ventricle has no  regional  wall motion abnormalities.   2. Right ventricular systolic function is normal. The right ventricular  size is normal.   3. The mitral valve is normal in structure. Mild mitral valve  regurgitation.  __________   LHC 11/18/2021:   Ost LM lesion is 30% stenosed.   Dist LAD lesion is 40% stenosed.   Ost RCA to Prox RCA lesion is 50% stenosed.   Prox RCA to Mid RCA lesion is 40% stenosed.   2nd Diag lesion is 30% stenosed.   RPDA lesion is 40% stenosed.   Mid LAD-3 lesion is 60% stenosed.   Mid LAD-1 lesion is 80% stenosed.   Mid RCA lesion is 70% stenosed.   The left ventricular systolic function is normal.   LV end diastolic pressure is mildly elevated.   The left ventricular ejection fraction is 55-65% by visual estimate.   1.  Significant two-vessel coronary artery disease.  Patent overlapped LAD stents but there is evidence of significant in-stent restenosis in the proximal segment.  In addition, there is borderline 60% stenosis distal to the stent.  Known moderate RCA disease but the distal stenosis appears worse. Intracoronary nitroglycerin was given which improved the overall appearance of the coronary arteries. 2.  Normal LV systolic function and moderately elevated left ventricular end-diastolic pressure.   Recommendations: Difficult situation overall given the patient's poor compliance with medical therapy and continued cocaine use.  This is a second in-stent restenosis in the LAD.  Management options include CABG or balloon angioplasty of the proximal portion of the LAD stent.  Will discuss options with the patient. __________   2D echo 11/16/2021: 1. Left ventricular ejection fraction, by estimation, is 60 to 65%. The  left ventricle has normal function. The left ventricle has no regional  wall motion abnormalities. There is moderate concentric left ventricular  hypertrophy. Left ventricular  diastolic parameters are consistent with Grade I diastolic dysfunction   (impaired relaxation).   2. Right ventricular systolic function is normal. The right ventricular  size is normal. Tricuspid regurgitation signal is inadequate for assessing  PA pressure.   3. The mitral valve is grossly normal. Trivial mitral valve  regurgitation. No evidence of mitral stenosis.   4. The aortic valve is tricuspid. There is mild calcification of the  aortic valve. Aortic valve regurgitation is not visualized. Aortic valve  sclerosis is present, with no evidence of aortic valve stenosis.   5. The inferior vena cava is normal in size with greater than 50%  respiratory variability, suggesting right atrial pressure of 3 mmHg.   Comparison(s): No significant change from prior study.  __________   Limited echo 07/16/2021: 1. Left ventricular ejection fraction, by estimation, is 60 to 65%. The  left ventricle has normal function. The left ventricle has no regional  wall motion abnormalities. There is moderate left ventricular hypertrophy.  Left ventricular diastolic  parameters are consistent with Grade II diastolic dysfunction  (pseudonormalization). The average left ventricular global longitudinal  strain is -11.7 %.   2. Right ventricular systolic function is normal. The right ventricular  size is normal. Tricuspid regurgitation signal is inadequate for assessing  PA pressure.   3.  The mitral valve is normal in structure. Mild mitral valve  regurgitation. No evidence of mitral stenosis.   4. The aortic valve is normal in structure. Aortic valve regurgitation is  not visualized. No aortic stenosis is present.   5. The inferior vena cava is normal in size with greater than 50%  respiratory variability, suggesting right atrial pressure of 3 mmHg.  __________   [facility] 05/20/2021:   Dist LAD lesion is 40% stenosed.   Prox RCA to Mid RCA lesion is 40% stenosed.   Ost RCA to Prox RCA lesion is 50% stenosed.   RPDA lesion is 40% stenosed.   Ost LM lesion is 30% stenosed.    Mid LAD lesion is 85% stenosed.   2nd Diag lesion is 60% stenosed.   A drug-eluting stent was successfully placed using a [place] [id number].   Balloon angioplasty was performed using a BALLN TREK RX [id number].   Post intervention, there is a 0% residual stenosis.   Post intervention, there is a 30% residual stenosis.   LV end diastolic pressure is moderately elevated.   The left ventricular ejection fraction is 50-55% by visual estimate.   1.  Severe one-vessel coronary artery disease due to distal edge restenosis of the previously placed stent in the left anterior descending artery.  In addition, there is extensive plaque distal to the stent involving the whole mid segment.  Stable moderate right coronary artery disease. 2.  Low normal LV systolic function with an EF of 50 to 55% with mild anterior apical hypokinesis.  Moderately elevated left ventricular end-diastolic pressure. 3.  Successful complex IVUS guided bifurcation angioplasty and drug-eluting stent placement to the mid LAD.  IVUS before PCI showed stent underexpansion distally as well as extensive plaque distal to the stent.  No stent fractures.  Another stent was added distally due to suspected distal edge dissection.  Balloon angioplasty was performed and the ostial second diagonal before placing the stent.    Recommendations: Dual antiplatelet therapy for at least 1 year. Aggressive treatment of risk factors. I discussed with the patient the importance of compliance with medications. AVOID catheterization via the right radial artery in the future due to significant radial artery spasm.  Only a 5 [place] guide could be advanced. __________   2D echo 05/18/2021: 1. Left ventricular ejection fraction, by estimation, is 50 to 55%. The  left ventricle has low normal function. The left ventricle demonstrates  regional wall motion abnormalities (hypokinesis of the mid to distal  anteroseptal/septal, apical region There   is  moderate left ventricular hypertrophy. Left ventricular diastolic  parameters are consistent with Grade I diastolic dysfunction (impaired  relaxation).   2. Right ventricular systolic function is normal. The right ventricular  size is normal. Tricuspid regurgitation signal is inadequate for assessing  PA pressure.   3. The mitral valve is normal in structure. Mild mitral valve  regurgitation. No evidence of mitral stenosis.   4. The aortic valve was not well visualized. Aortic valve regurgitation  is not visualized. Aortic valve sclerosis is present, with no evidence of  aortic valve stenosis.   5. The inferior vena cava is normal in size with greater than 50%  respiratory variability, suggesting right atrial pressure of 3 mmHg.  __________   [facility] 11/29/2020: Pharmacological myocardial perfusion imaging study with small region of ischemia in the apical wall on attenuation corrected images  This defect is not noted on non-attenuation corrected images, raising the concern for over processing. Normal  wall motion, EF estimated at 43% (unable to exclude component of GI uptake artifact) No EKG changes concerning for ischemia at peak stress or in recovery. Resting EKG with diffuse ST and T wave abnormality V3 through V6, I, II and aVL CT attenuation correction images with heavy coronary calcification in the LAD (stent), mild aortic atherosclerosis in the arch Low risk scan __________   [facility] 01/24/2020: There is mild left ventricular systolic dysfunction. The left ventricular ejection fraction is 45-50% by visual estimate. LV end diastolic pressure is moderately elevated. Ost RCA to Prox RCA lesion is 60% stenosed. Prox RCA to Mid RCA lesion is 40% stenosed. Mid LAD lesion is 90% stenosed. Post intervention, there is a 0% residual stenosis. A drug-eluting stent was successfully placed using a STENT RESOLUTE ONYX [id number]. Dist LAD lesion is 40% stenosed.   1.  Severe one-vessel  coronary artery disease with 90% stenosis in the mid LAD which is the likely culprit for non-ST elevation myocardial infarction.  There is also moderate right coronary artery disease. 2.  Mildly reduced LV systolic function with an EF of 45% with moderate to severe anteroapical hypokinesis. 3.  Moderately elevated left ventricular end-diastolic pressure at 22 mmHg 4.  Successful angioplasty and drug-eluting stent placement to the mid LAD.   Recommendations: Dual antiplatelet therapy for at least 1 year.  Consult case manager for assistance with [place].  Brilinta can be switched to clopidogrel or prasugrel after 1 month of treatment if cost is an issue. I switch metoprolol to carvedilol.  Resume ACE inhibitor if renal function returns to baseline. Smoking cessation. Cardiac rehab. Likely discharge home tomorrow. __________   2D echo 01/24/2020: 1. Left ventricular ejection fraction, by estimation, is 50%. The left  ventricle has low normal function. The left ventricle demonstrates  regional wall motion abnormalities (see scoring diagram/findings for  description). There is mild left ventricular  hypertrophy. Left ventricular diastolic parameters are consistent with  Grade I diastolic dysfunction (impaired relaxation). There is moderate  hypokinesis of the left ventricular, mid-apical anteroseptal wall.   2. Right ventricular systolic function is normal. The right ventricular  size is normal.   3. The mitral valve is normal in structure. No evidence of mitral valve  regurgitation.   4. The aortic valve is tricuspid. Aortic valve regurgitation is not  visualized. Mild aortic valve sclerosis is present, with no evidence of  aortic valve stenosis.  __________   2D echo 11/26/2019: 1. Left ventricular ejection fraction, by estimation, is 50 to 55%. The  left ventricle has low normal function. The left ventricle has no regional  wall motion abnormalities. Left ventricular diastolic  parameters are  consistent with Grade I diastolic  dysfunction (impaired relaxation).   2. Right ventricular systolic function is normal. The right ventricular  size is normal.   3. The mitral valve is normal in structure. No evidence of mitral valve  regurgitation.   4. The aortic valve is calcified. Aortic valve regurgitation is not  visualized. Mild aortic valve sclerosis is present, with no evidence of  aortic valve stenosis.    EKG:  EKG is not ordered today.   Recent Labs: 02/22/2022: ALT 11 02/24/2022: Magnesium 2.0 03/07/2022: Hemoglobin 12.3; Platelets 883 04/14/2022: B Natriuretic Peptide 48.9; BUN 13; Creatinine, Ser 1.28; Potassium 4.1; Sodium 136  Recent Lipid Panel    Component Value Date/Time   CHOL 179 11/17/2021 1216   TRIG 284 (H) 11/17/2021 1216   HDL 29 (L) 11/17/2021 1216   CHOLHDL 6.2  11/17/2021 1216   VLDL 57 (H) 11/17/2021 1216   LDLCALC 93 11/17/2021 1216    PHYSICAL EXAM:    VS:  BP 120/78 (BP Location: Left Arm, Patient Position: Sitting, Cuff Size: Normal)   Pulse 87   Ht 6' (1.829 m)   Wt 223 lb 9.6 oz (101.4 kg)   SpO2 98%   BMI 30.33 kg/m   BMI: Body mass index is 30.33 kg/m.  Physical Exam Constitutional:      General: He is not in acute distress.    Appearance: Normal appearance. He is not ill-appearing.  Cardiovascular:     Rate and Rhythm: Normal rate.     Pulses: Normal pulses.     Heart sounds: No murmur heard. Pulmonary:     Breath sounds: No wheezing, rhonchi or rales.  Abdominal:     Palpations: Abdomen is soft.  Musculoskeletal:        General: No swelling.  Skin:    General: Skin is warm and dry.  Neurological:     Mental Status: He is alert and oriented to person, place, and time.     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  He mentions wanting to join a gym, as he was not able to complete cardiac rehab d/t no insurance.  He was not able to keep his follow-up appointment with [facility].  Encouraged him to focus on mainly walking for now, and recreational pool/stretching which he enjoys.  Stable with no anginal symptoms. No indication for ischemic evaluation.  Continue Aspirin, Plavix, metoprolol, ntg prn (has not needed), rosuvastatin.   HFpEF: Echo on 03/19/22 65-70%, mild LVH, grade I DD. NYHA class II, euvolemic. Weight 223, appears to be closer to his dry weight. Discussed fluid restriction & sodium restriction. He reports he is taking his HF medications as prescribed, including his lasix.  Continue Jardiance, losartan, metoprolol, spironolactone. Has f/u with Dr. Sung Amabile on 05/07/21.  DOE: Mixed features, could be related to deconditioning, heart failure, or possibly a pulmonary component. He has been using an albuterol inhaler several times/day. Will refill this for him x 1. Recent previous history of heavy smoking, and asthma as a child. Ambulatory referral for pulmonology -- he also failed to follow up with them for OSA.   HTN: Blood pressure today is 120/78, well controlled, continue losartan, metoprolol.   HLD: LDL 93 on 11/17/21. He stopped Crestor ~ 2 months ago, said he thought he was taking it but turns out he was not. Will send in refill today. Will repeat FLP and LFTs at his next visit in 3 months.   CKD stage III: Careful titration of diuretic and antihypertensive.    Polysubstance use: continues to abstain.   Medication nonadherence: continues to run out of medications and/or not be clear on his medication regimen. Encouraged him to bring medication bottles with him  and/or create a note on his smart phone, he is eager to streamline his medication regimen and is hopeful that he can stop his lasix at some point.  Dyruria: largely improved after recent urethral dilation by urology.   OSA: was previously referred to pulmonology but failed to show up. Will refer him to pulmonology per above for DOE as well.       Disposition: F/u with Dr. Garen Lah or an APP in 3 months, will need FLP and LFTs.     Medication Adjustments/Labs and Tests Ordered: Current medicines are reviewed at length with the patient today.  Concerns regarding medicines are outlined above. Medication changes, Labs and Tests ordered today are summarized above and listed in the Patient Instructions accessible in Encounters.   Signed, Venia Carbon, NP 04/25/2022 3:35 PM     [facility] [address] [place] [address] [place], [place] [address] [phone]

## 2022-04-16 NOTE — Telephone Encounter (Addendum)
  Spoke with pt via phone and verified all current medications of the current medication list. Including Furosemide 40 MG tablet daily.  Pt was advised to continue taking medications and to bring medications/list to next appointment on Thursday March 14 @ 1000. Pt aware, agreeable, and verbalized understanding.     Multiple attempts to reach pt.  Letter sent via mychart.     On 2/21 VM 1507  Note

## 2022-04-16 NOTE — Telephone Encounter (Signed)
Unable to reach pt at this time, mess states "unable to complete call at this time" will try again later

## 2022-04-23 ENCOUNTER — Other Ambulatory Visit: Payer: Self-pay | Admitting: Physician Assistant

## 2022-04-25 ENCOUNTER — Ambulatory Visit: Payer: Medicaid Other | Attending: Physician Assistant | Admitting: Cardiology

## 2022-04-25 ENCOUNTER — Encounter: Payer: Self-pay | Admitting: Cardiology

## 2022-04-25 VITALS — BP 120/78 | HR 87 | Ht 72.0 in | Wt 223.6 lb

## 2022-04-25 DIAGNOSIS — I2510 Atherosclerotic heart disease of native coronary artery without angina pectoris: Secondary | ICD-10-CM | POA: Diagnosis not present

## 2022-04-25 DIAGNOSIS — E785 Hyperlipidemia, unspecified: Secondary | ICD-10-CM

## 2022-04-25 DIAGNOSIS — Z951 Presence of aortocoronary bypass graft: Secondary | ICD-10-CM

## 2022-04-25 DIAGNOSIS — Z9189 Other specified personal risk factors, not elsewhere classified: Secondary | ICD-10-CM

## 2022-04-25 DIAGNOSIS — I214 Non-ST elevation (NSTEMI) myocardial infarction: Secondary | ICD-10-CM | POA: Diagnosis not present

## 2022-04-25 DIAGNOSIS — R300 Dysuria: Secondary | ICD-10-CM

## 2022-04-25 DIAGNOSIS — I5032 Chronic diastolic (congestive) heart failure: Secondary | ICD-10-CM | POA: Diagnosis not present

## 2022-04-25 DIAGNOSIS — F1910 Other psychoactive substance abuse, uncomplicated: Secondary | ICD-10-CM

## 2022-04-25 DIAGNOSIS — I10 Essential (primary) hypertension: Secondary | ICD-10-CM

## 2022-04-25 DIAGNOSIS — N183 Chronic kidney disease, stage 3 unspecified: Secondary | ICD-10-CM

## 2022-04-25 DIAGNOSIS — R0602 Shortness of breath: Secondary | ICD-10-CM

## 2022-04-25 DIAGNOSIS — G4733 Obstructive sleep apnea (adult) (pediatric): Secondary | ICD-10-CM

## 2022-04-25 MED ORDER — ROSUVASTATIN CALCIUM 40 MG PO TABS: 40.0000 mg | ORAL_TABLET | Freq: Every day | ORAL | 3 refills | Status: DC

## 2022-04-25 MED ORDER — ALBUTEROL SULFATE HFA 108 (90 BASE) MCG/ACT IN AERS: 2.0000 | INHALATION_SPRAY | Freq: Four times a day (QID) | RESPIRATORY_TRACT | 0 refills | Status: DC | PRN

## 2022-04-25 NOTE — Patient Instructions (Signed)
Medication Instructions:  No changes at this time.   *If you need a refill on your cardiac medications before your next appointment, please call your pharmacy*   Lab Work: None  If you have labs (blood work) drawn today and your tests are completely normal, you will receive your results only by: [place] (if you have MyChart) OR A paper copy in the mail If you have any lab test that is abnormal or we need to change your treatment, we will call you to review the results.   Testing/Procedures: None   Follow-Up: At [facility], you and your health needs are our priority.  As part of our continuing mission to provide you with exceptional heart care, we have created designated Provider Care Teams.  These Care Teams include your primary Cardiologist (physician) and Advanced Practice Providers (APPs -  Physician Assistants and Nurse Practitioners) who all work together to provide you with the care you need, when you need it.  Your next appointment:   3 month(s)  Provider:   Kate Sable, MD or Christell Faith, PA-C    Other Instructions Referral placed to pulmonary and their number is [phone]

## 2022-04-29 ENCOUNTER — Other Ambulatory Visit: Payer: Self-pay | Admitting: Urology

## 2022-05-01 ENCOUNTER — Ambulatory Visit: Payer: Self-pay | Admitting: Dietician

## 2022-05-01 ENCOUNTER — Other Ambulatory Visit: Payer: Self-pay

## 2022-05-01 ENCOUNTER — Inpatient Hospital Stay (HOSPITAL_COMMUNITY)
Admit: 2022-05-01 | Discharge: 2022-05-01 | Disposition: A | Discharge status: Home / Self Care | Payer: Medicaid Other | Attending: Cardiology | Admitting: Cardiology

## 2022-05-01 ENCOUNTER — Inpatient Hospital Stay
Admission: EM | Admit: 2022-05-01 | Discharge: 2022-05-03 | DRG: 281 | Disposition: A | Discharge status: Home / Self Care | Payer: Medicaid Other | Attending: Internal Medicine | Admitting: Internal Medicine

## 2022-05-01 ENCOUNTER — Emergency Department: Payer: Medicaid Other

## 2022-05-01 DIAGNOSIS — Z7902 Long term (current) use of antithrombotics/antiplatelets: Secondary | ICD-10-CM | POA: Diagnosis not present

## 2022-05-01 DIAGNOSIS — F17210 Nicotine dependence, cigarettes, uncomplicated: Secondary | ICD-10-CM | POA: Diagnosis present

## 2022-05-01 DIAGNOSIS — N1831 Chronic kidney disease, stage 3a: Secondary | ICD-10-CM | POA: Diagnosis present

## 2022-05-01 DIAGNOSIS — R079 Chest pain, unspecified: Secondary | ICD-10-CM

## 2022-05-01 DIAGNOSIS — Z79899 Other long term (current) drug therapy: Secondary | ICD-10-CM | POA: Diagnosis not present

## 2022-05-01 DIAGNOSIS — I21A1 Myocardial infarction type 2: Secondary | ICD-10-CM | POA: Diagnosis present

## 2022-05-01 DIAGNOSIS — E669 Obesity, unspecified: Secondary | ICD-10-CM | POA: Diagnosis present

## 2022-05-01 DIAGNOSIS — Z7982 Long term (current) use of aspirin: Secondary | ICD-10-CM | POA: Diagnosis not present

## 2022-05-01 DIAGNOSIS — Z8616 Personal history of COVID-19: Secondary | ICD-10-CM | POA: Diagnosis not present

## 2022-05-01 DIAGNOSIS — I300 Acute nonspecific idiopathic pericarditis: Secondary | ICD-10-CM | POA: Diagnosis not present

## 2022-05-01 DIAGNOSIS — F1910 Other psychoactive substance abuse, uncomplicated: Secondary | ICD-10-CM | POA: Diagnosis not present

## 2022-05-01 DIAGNOSIS — I200 Unstable angina: Principal | ICD-10-CM

## 2022-05-01 DIAGNOSIS — F1210 Cannabis abuse, uncomplicated: Secondary | ICD-10-CM | POA: Diagnosis present

## 2022-05-01 DIAGNOSIS — I252 Old myocardial infarction: Secondary | ICD-10-CM | POA: Diagnosis not present

## 2022-05-01 DIAGNOSIS — D72829 Elevated white blood cell count, unspecified: Secondary | ICD-10-CM | POA: Diagnosis not present

## 2022-05-01 DIAGNOSIS — I25110 Atherosclerotic heart disease of native coronary artery with unstable angina pectoris: Secondary | ICD-10-CM | POA: Diagnosis present

## 2022-05-01 DIAGNOSIS — Z6830 Body mass index (BMI) 30.0-30.9, adult: Secondary | ICD-10-CM | POA: Diagnosis not present

## 2022-05-01 DIAGNOSIS — Z951 Presence of aortocoronary bypass graft: Secondary | ICD-10-CM | POA: Diagnosis not present

## 2022-05-01 DIAGNOSIS — I2489 Other forms of acute ischemic heart disease: Secondary | ICD-10-CM | POA: Diagnosis not present

## 2022-05-01 DIAGNOSIS — E785 Hyperlipidemia, unspecified: Secondary | ICD-10-CM | POA: Diagnosis present

## 2022-05-01 DIAGNOSIS — I10 Essential (primary) hypertension: Secondary | ICD-10-CM | POA: Diagnosis present

## 2022-05-01 DIAGNOSIS — I130 Hypertensive heart and chronic kidney disease with heart failure and stage 1 through stage 4 chronic kidney disease, or unspecified chronic kidney disease: Secondary | ICD-10-CM | POA: Diagnosis present

## 2022-05-01 DIAGNOSIS — F1410 Cocaine abuse, uncomplicated: Secondary | ICD-10-CM | POA: Diagnosis not present

## 2022-05-01 DIAGNOSIS — I25118 Atherosclerotic heart disease of native coronary artery with other forms of angina pectoris: Secondary | ICD-10-CM

## 2022-05-01 DIAGNOSIS — R0781 Pleurodynia: Secondary | ICD-10-CM | POA: Diagnosis not present

## 2022-05-01 DIAGNOSIS — K219 Gastro-esophageal reflux disease without esophagitis: Secondary | ICD-10-CM | POA: Diagnosis present

## 2022-05-01 DIAGNOSIS — I255 Ischemic cardiomyopathy: Secondary | ICD-10-CM | POA: Diagnosis present

## 2022-05-01 DIAGNOSIS — Z7984 Long term (current) use of oral hypoglycemic drugs: Secondary | ICD-10-CM | POA: Diagnosis not present

## 2022-05-01 DIAGNOSIS — I214 Non-ST elevation (NSTEMI) myocardial infarction: Secondary | ICD-10-CM | POA: Diagnosis not present

## 2022-05-01 DIAGNOSIS — I259 Chronic ischemic heart disease, unspecified: Secondary | ICD-10-CM | POA: Diagnosis not present

## 2022-05-01 DIAGNOSIS — R7989 Other specified abnormal findings of blood chemistry: Secondary | ICD-10-CM | POA: Diagnosis not present

## 2022-05-01 DIAGNOSIS — Z720 Tobacco use: Secondary | ICD-10-CM | POA: Diagnosis not present

## 2022-05-01 DIAGNOSIS — I2510 Atherosclerotic heart disease of native coronary artery without angina pectoris: Secondary | ICD-10-CM | POA: Diagnosis present

## 2022-05-01 DIAGNOSIS — I319 Disease of pericardium, unspecified: Principal | ICD-10-CM | POA: Diagnosis present

## 2022-05-01 DIAGNOSIS — Z8249 Family history of ischemic heart disease and other diseases of the circulatory system: Secondary | ICD-10-CM

## 2022-05-01 DIAGNOSIS — I5032 Chronic diastolic (congestive) heart failure: Secondary | ICD-10-CM | POA: Diagnosis present

## 2022-05-01 DIAGNOSIS — Z87891 Personal history of nicotine dependence: Secondary | ICD-10-CM | POA: Diagnosis present

## 2022-05-01 LAB — CBC WITH DIFFERENTIAL/PLATELET
Abs Immature Granulocytes: 0.03 10*3/uL (ref 0.00–0.07)
Basophils Absolute: 0.1 10*3/uL (ref 0.0–0.1)
Basophils Relative: 1 %
Eosinophils Absolute: 1.1 10*3/uL — ABNORMAL HIGH (ref 0.0–0.5)
Eosinophils Relative: 8 %
HCT: 44.2 % (ref 39.0–52.0)
Hemoglobin: 14.3 g/dL (ref 13.0–17.0)
Immature Granulocytes: 0 %
Lymphocytes Relative: 19 %
Lymphs Abs: 2.4 10*3/uL (ref 0.7–4.0)
MCH: 27 pg (ref 26.0–34.0)
MCHC: 32.4 g/dL (ref 30.0–36.0)
MCV: 83.6 fL (ref 80.0–100.0)
Monocytes Absolute: 1 10*3/uL (ref 0.1–1.0)
Monocytes Relative: 8 %
Neutro Abs: 8.2 10*3/uL — ABNORMAL HIGH (ref 1.7–7.7)
Neutrophils Relative %: 64 %
Platelets: 459 10*3/uL — ABNORMAL HIGH (ref 150–400)
RBC: 5.29 MIL/uL (ref 4.22–5.81)
RDW: 15 % (ref 11.5–15.5)
WBC: 12.8 10*3/uL — ABNORMAL HIGH (ref 4.0–10.5)
nRBC: 0 % (ref 0.0–0.2)

## 2022-05-01 LAB — MRSA NEXT GEN BY PCR, NASAL: MRSA by PCR Next Gen: NOT DETECTED

## 2022-05-01 LAB — COMPREHENSIVE METABOLIC PANEL
ALT: 23 U/L (ref 0–44)
AST: 26 U/L (ref 15–41)
Albumin: 3.7 g/dL (ref 3.5–5.0)
Alkaline Phosphatase: 104 U/L (ref 38–126)
Anion gap: 7 (ref 5–15)
BUN: 18 mg/dL (ref 6–20)
CO2: 19 mmol/L — ABNORMAL LOW (ref 22–32)
Calcium: 8.6 mg/dL — ABNORMAL LOW (ref 8.9–10.3)
Chloride: 109 mmol/L (ref 98–111)
Creatinine, Ser: 1.4 mg/dL — ABNORMAL HIGH (ref 0.61–1.24)
GFR, Estimated: >60 mL/min (ref >60)
Glucose, Bld: 142 mg/dL — ABNORMAL HIGH (ref 70–99)
Potassium: 3.8 mmol/L (ref 3.5–5.1)
Sodium: 135 mmol/L (ref 135–145)
Total Bilirubin: 0.3 mg/dL (ref 0.3–1.2)
Total Protein: 7.8 g/dL (ref 6.5–8.1)

## 2022-05-01 LAB — URINE DRUG SCREEN, QUALITATIVE (ARMC ONLY)
Amphetamines, Ur Screen: NOT DETECTED
Barbiturates, Ur Screen: NOT DETECTED
Benzodiazepine, Ur Scrn: NOT DETECTED
Cannabinoid 50 Ng, Ur ~~LOC~~: POSITIVE — AB
Cocaine Metabolite,Ur ~~LOC~~: POSITIVE — AB
MDMA (Ecstasy)Ur Screen: NOT DETECTED
Methadone Scn, Ur: NOT DETECTED
Opiate, Ur Screen: NOT DETECTED
Phencyclidine (PCP) Ur S: NOT DETECTED
Tricyclic, Ur Screen: NOT DETECTED

## 2022-05-01 LAB — PROTIME-INR
INR: 1 (ref 0.8–1.2)
Prothrombin Time: 13 seconds (ref 11.4–15.2)

## 2022-05-01 LAB — ECHOCARDIOGRAM COMPLETE
AR max vel: 4.5 cm2
AV Area VTI: 5.11 cm2
AV Area mean vel: 4.51 cm2
AV Mean grad: 2 mmHg
AV Peak grad: 3.5 mmHg
Ao pk vel: 0.93 m/s
Area-P 1/2: 3.61 cm2
Height: 72 in
S' Lateral: 2 cm
Weight: 3576.74 oz

## 2022-05-01 LAB — APTT: aPTT: 25 seconds (ref 24–36)

## 2022-05-01 LAB — TROPONIN I (HIGH SENSITIVITY)
Troponin I (High Sensitivity): 163 ng/L (ref <18)
Troponin I (High Sensitivity): 173 ng/L (ref <18)
Troponin I (High Sensitivity): 195 ng/L (ref <18)
Troponin I (High Sensitivity): 203 ng/L (ref <18)
Troponin I (High Sensitivity): 180 ng/L (ref <18)

## 2022-05-01 LAB — HEPARIN LEVEL (UNFRACTIONATED): Heparin Unfractionated: 0.24 IU/mL — ABNORMAL LOW (ref 0.30–0.70)

## 2022-05-01 LAB — BRAIN NATRIURETIC PEPTIDE: B Natriuretic Peptide: 40.4 pg/mL (ref 0.0–100.0)

## 2022-05-01 MED ORDER — NICOTINE 21 MG/24HR TD PT24
21.0000 mg | MEDICATED_PATCH | Freq: Every day | TRANSDERMAL | Status: DC
  Administered 2022-05-01 – 2022-05-03 (×3): 21 mg via TRANSDERMAL
  Filled 2022-05-01 (×3): qty 1

## 2022-05-01 MED ORDER — HEPARIN BOLUS VIA INFUSION
1500.0000 [IU] | Freq: Once | INTRAVENOUS | Status: AC
  Administered 2022-05-01: 1500 [IU] via INTRAVENOUS
  Filled 2022-05-01: qty 1500

## 2022-05-01 MED ORDER — GABAPENTIN 100 MG PO CAPS
200.0000 mg | ORAL_CAPSULE | Freq: Two times a day (BID) | ORAL | Status: DC
  Administered 2022-05-01 – 2022-05-03 (×5): 200 mg via ORAL
  Filled 2022-05-01 (×5): qty 2

## 2022-05-01 MED ORDER — ACETAMINOPHEN 325 MG PO TABS
650.0000 mg | ORAL_TABLET | Freq: Four times a day (QID) | ORAL | Status: DC | PRN
  Administered 2022-05-02 (×2): 650 mg via ORAL
  Filled 2022-05-01 (×2): qty 2

## 2022-05-01 MED ORDER — SPIRONOLACTONE 25 MG PO TABS
25.0000 mg | ORAL_TABLET | Freq: Every day | ORAL | Status: DC
  Administered 2022-05-01 – 2022-05-03 (×3): 25 mg via ORAL
  Filled 2022-05-01 (×3): qty 1

## 2022-05-01 MED ORDER — OXYCODONE-ACETAMINOPHEN 5-325 MG PO TABS
1.0000 | ORAL_TABLET | ORAL | Status: DC | PRN
  Administered 2022-05-01: 1 via ORAL
  Filled 2022-05-01: qty 1

## 2022-05-01 MED ORDER — CLOPIDOGREL BISULFATE 75 MG PO TABS
75.0000 mg | ORAL_TABLET | Freq: Every day | ORAL | Status: DC
  Administered 2022-05-01 – 2022-05-03 (×3): 75 mg via ORAL
  Filled 2022-05-01 (×3): qty 1

## 2022-05-01 MED ORDER — EMPAGLIFLOZIN 10 MG PO TABS
10.0000 mg | ORAL_TABLET | Freq: Every evening | ORAL | Status: DC
  Administered 2022-05-01 – 2022-05-02 (×2): 10 mg via ORAL
  Filled 2022-05-01 (×4): qty 1

## 2022-05-01 MED ORDER — NITROGLYCERIN 0.4 MG SL SUBL: 0.4000 mg | SUBLINGUAL_TABLET | SUBLINGUAL | Status: DC | PRN

## 2022-05-01 MED ORDER — FAMOTIDINE 20 MG PO TABS
20.0000 mg | ORAL_TABLET | Freq: Two times a day (BID) | ORAL | Status: DC
  Administered 2022-05-01 – 2022-05-03 (×4): 20 mg via ORAL
  Filled 2022-05-01 (×4): qty 1

## 2022-05-01 MED ORDER — COLCHICINE 0.6 MG PO TABS
0.6000 mg | ORAL_TABLET | Freq: Two times a day (BID) | ORAL | Status: DC
  Administered 2022-05-01 – 2022-05-03 (×5): 0.6 mg via ORAL
  Filled 2022-05-01 (×5): qty 1

## 2022-05-01 MED ORDER — ONDANSETRON HCL 4 MG/2ML IJ SOLN
4.0000 mg | Freq: Three times a day (TID) | INTRAMUSCULAR | Status: DC | PRN
  Administered 2022-05-01: 4 mg via INTRAVENOUS
  Filled 2022-05-01: qty 2

## 2022-05-01 MED ORDER — ROSUVASTATIN CALCIUM 10 MG PO TABS
40.0000 mg | ORAL_TABLET | Freq: Every evening | ORAL | Status: DC
  Administered 2022-05-01 – 2022-05-02 (×2): 40 mg via ORAL
  Filled 2022-05-01 (×2): qty 2

## 2022-05-01 MED ORDER — DM-GUAIFENESIN ER 30-600 MG PO TB12: 1.0000 | ORAL_TABLET | Freq: Two times a day (BID) | ORAL | Status: DC | PRN

## 2022-05-01 MED ORDER — FUROSEMIDE 10 MG/ML IJ SOLN
40.0000 mg | Freq: Once | INTRAMUSCULAR | Status: AC
  Administered 2022-05-01: 40 mg via INTRAVENOUS
  Filled 2022-05-01: qty 4

## 2022-05-01 MED ORDER — HEPARIN (PORCINE) 25000 UT/250ML-% IV SOLN
1400.0000 [IU]/h | INTRAVENOUS | Status: DC
  Administered 2022-05-01: 1250 [IU]/h via INTRAVENOUS
  Administered 2022-05-02 (×2): 1400 [IU]/h via INTRAVENOUS
  Filled 2022-05-01 (×4): qty 250

## 2022-05-01 MED ORDER — FUROSEMIDE 40 MG PO TABS
40.0000 mg | ORAL_TABLET | Freq: Every day | ORAL | Status: DC
  Administered 2022-05-02 – 2022-05-03 (×2): 40 mg via ORAL
  Filled 2022-05-01 (×2): qty 1

## 2022-05-01 MED ORDER — HEPARIN BOLUS VIA INFUSION
4000.0000 [IU] | Freq: Once | INTRAVENOUS | Status: AC
  Administered 2022-05-01: 4000 [IU] via INTRAVENOUS
  Filled 2022-05-01: qty 4000

## 2022-05-01 MED ORDER — HYDRALAZINE HCL 20 MG/ML IJ SOLN: 5.0000 mg | INTRAMUSCULAR | Status: DC | PRN

## 2022-05-01 MED ORDER — OXYCODONE-ACETAMINOPHEN 5-325 MG PO TABS
1.0000 | ORAL_TABLET | Freq: Once | ORAL | Status: AC
  Administered 2022-05-01: 1 via ORAL
  Filled 2022-05-01: qty 1

## 2022-05-01 MED ORDER — ALBUTEROL SULFATE (2.5 MG/3ML) 0.083% IN NEBU: 3.0000 mL | INHALATION_SOLUTION | RESPIRATORY_TRACT | Status: DC | PRN

## 2022-05-01 MED ORDER — METOPROLOL SUCCINATE ER 25 MG PO TB24
25.0000 mg | ORAL_TABLET | Freq: Every day | ORAL | Status: DC
  Administered 2022-05-01 – 2022-05-03 (×3): 25 mg via ORAL
  Filled 2022-05-01 (×3): qty 1

## 2022-05-01 MED ORDER — ASPIRIN 81 MG PO TBEC
81.0000 mg | DELAYED_RELEASE_TABLET | Freq: Every day | ORAL | Status: DC
  Administered 2022-05-02 – 2022-05-03 (×2): 81 mg via ORAL
  Filled 2022-05-01 (×2): qty 1

## 2022-05-01 MED ORDER — LOSARTAN POTASSIUM 50 MG PO TABS
50.0000 mg | ORAL_TABLET | Freq: Every day | ORAL | Status: DC
  Administered 2022-05-01 – 2022-05-03 (×3): 50 mg via ORAL
  Filled 2022-05-01 (×3): qty 1

## 2022-05-01 NOTE — Congregational Nurse Program (Signed)
ANTICOAGULATION CONSULT NOTE - Initial Consult  Pharmacy Consult for heparin Indication: NSTEMI  No Known Allergies  Patient Measurements: Height: 6' (182.9 cm) Weight: 101.4 kg (223 lb 8.7 oz) IBW/kg (Calculated) : 77.6 Heparin Dosing Weight: 98.3 kg  Vital Signs: Temp: 97.6 F (36.4 C) (03/07 0750) Temp Source: Oral (03/07 0750) BP: 108/78 (03/07 1800) Pulse Rate: 76 (03/07 1800)  Labs: Recent Labs    05/01/22 0800 05/01/22 0802 05/01/22 0926 05/01/22 1246 05/01/22 1529 05/01/22 1718  HGB 14.3  --   --   --   --   --   HCT 44.2  --   --   --   --   --   PLT 459*  --   --   --   --   --   APTT 25  --   --   --   --   --   LABPROT  --  13.0  --   --   --   --   INR  --  1.0  --   --   --   --   HEPARINUNFRC  --   --   --   --   --  0.24*  CREATININE 1.40*  --   --   --   --   --   TROPONINIHS 163*  --  173* 195* 203*  --      Estimated Creatinine Clearance: 79.5 mL/min (A) (by C-G formula based on SCr of 1.4 mg/dL (H)).   Medical History: Past Medical History:  Diagnosis Date   CAD (coronary artery disease)    a. 11/2019 NSTEMI/PCI: LM min irregs, LAD 73m(2.75x22 Resolute Onyx DES), 40d, D2 min irregs, LCX/LPAV min irregs, RCA 60ost, 40p/m, EF 45-50%; b. 11/2020 MV: EF 43%, small area of apical ischemia ->? over processing-->low risk.   Hyperlipidemia    Hypertension    Ischemic cardiomyopathy    a. 11/2019 LV Gram: EF 45-50%; b. 12/2019 Echo: EF 50% w/ mod mid-apical anteroseptal HK, GrI DD, nl RV fxn, mild Ao sclerosis.   Tobacco use 11/25/2019    Medications:  No evidence of PTA AC meds  Assessment: Pharmacy consulted to dose heparin in this 47yo M who presents to the ED with extensive cardiac history, status post recent two-vessel CABG on dual antiplatelet therapy.  Troponin elevated.  Baseline Labs: Hgb 14.3, Plt 459, INR 1.0, aPTT ordered    Goal of Therapy:  Heparin level 0.3-0.7 units/ml Monitor platelets by anticoagulation protocol:  Yes  3/07 1718 HL 0.24, subtherapeutic @ 1250 u/hr   Plan:  -Give heparin 1500 units IV x 1 -Increase heparin infusion rate to 1400 units/hr -Check heparin level 6 hours after rate change -Daily CBC while on heparin  HLorin Picket PharmD 05/01/2022,6:09 PM

## 2022-05-01 NOTE — H&P (Signed)
History and Physical    DRAELYN SERVICE [id number] DOB: 1975/10/18 DOA: 05/01/2022  Referring MD/NP/PA:   PCP: Renee Rival, FNP   Patient coming from:  The patient is coming from home.      Chief Complaint: chest pain  HPI: Logan Crawford is a 47 y.o. male with medical history significant of CAD, s/p of DES, s/p of CABG on 02/19/22, HTN, HLD, dCHF, CKD-3a, polysubstance abuse, cocaine abuse, tobacco abuse, who presents with chest pain.    Pt states that he has intermittent chest pain for about 3 days.  His chest pain is located in the substernal area, pressure-like, 4 out of 10 in severity, radiating to arms, aggravated by laying flat.  Patient states that his pain is worse in the night.  Patient has mild shortness breath, no cough, fever or chills.  Patient denies nausea vomiting, diarrhea or abdominal pain.  No symptoms of UTI.  No dark stool or rectal bleeding. Pt states that he feels a lot fluid in his abdomen.  Data reviewed independently and ED Course: pt was found to have trop  163, WBC 12.8, stable renal function, temperature normal, blood pressure 114/83, heart rate 103, 93, RR 20, 11, oxygen saturation 96% on room air.  Chest x-ray negative.  Patient is admitted to telemetry bed as inpatient.  Dr. Rockey Situ of cardiology is consulted.   EKG: I have personally reviewed.  Sinus rhythm, QTc 445, LAE, Q waves in the inferior leads that V4-V6   Review of Systems:   General: no fevers, chills, no body weight gain, fatigue HEENT: no blurry vision, hearing changes or sore throat Respiratory: has mild dyspnea, no coughing, wheezing CV: has chest pain, no palpitations GI: no nausea, vomiting, abdominal pain, diarrhea, constipation GU: no dysuria, burning on urination, increased urinary frequency, hematuria  Ext: no leg edema Neuro: no unilateral weakness, numbness, or tingling, no vision change or hearing loss Skin: no rash, no skin tear. MSK: No muscle spasm, no  deformity, no limitation of range of movement in spin Heme: No easy bruising.  Travel history: No recent long distant travel.   Allergy: No Known Allergies  Past Medical History:  Diagnosis Date   CAD (coronary artery disease)    a. 11/2019 NSTEMI/PCI: LM min irregs, LAD 31m(2.75x22 Resolute Onyx DES), 40d, D2 min irregs, LCX/LPAV min irregs, RCA 60ost, 40p/m, EF 45-50%; b. 11/2020 MV: EF 43%, small area of apical ischemia ->? over processing-->low risk.   Hyperlipidemia    Hypertension    Ischemic cardiomyopathy    a. 11/2019 LV Gram: EF 45-50%; b. 12/2019 Echo: EF 50% w/ mod mid-apical anteroseptal HK, GrI DD, nl RV fxn, mild Ao sclerosis.   Tobacco use 11/25/2019    Past Surgical History:  Procedure Laterality Date   CORONARY ARTERY BYPASS GRAFT N/A 02/19/2022   Procedure: CORONARY ARTERY BYPASS GRAFTING (CABG) X THREE, USING ENDOSCOPICALLY HARVESTED RIGHT GREATER SAPHENOUS VEIN  AND LEFT ATRIAL APPENDAGE CLIPPING;  Surgeon: ENeomia Glass MD;  [facility] [facility]  Service: Open Heart Surgery;  Laterality: N/A;   CORONARY STENT INTERVENTION N/A 01/24/2020   Procedure: CORONARY STENT INTERVENTION;  Surgeon: AWellington Hampshire MD;  [facility];  Service: Cardiovascular;  Laterality: N/A;   CORONARY STENT INTERVENTION N/A 05/20/2021   Procedure: CORONARY STENT INTERVENTION;  Surgeon: AWellington Hampshire MD;  [facility];  Service: Cardiovascular;  Laterality: N/A;   INTRAVASCULAR ULTRASOUND/IVUS N/A 05/20/2021   Procedure: Intravascular Ultrasound/IVUS;  Surgeon: Wellington Hampshire, MD;  Location: [place] [facility];  Service: Cardiovascular;  Laterality: N/A;   IRRIGATION AND DEBRIDEMENT KNEE Left    LEFT HEART CATH AND CORONARY ANGIOGRAPHY N/A 01/24/2020   Procedure: LEFT HEART CATH AND CORONARY ANGIOGRAPHY;  Surgeon: Wellington Hampshire, MD;  Location: [place] [facility];  Service: Cardiovascular;  Laterality: N/A;   LEFT HEART CATH AND CORONARY  ANGIOGRAPHY N/A 11/18/2021   Procedure: LEFT HEART CATH AND CORONARY ANGIOGRAPHY;  Surgeon: Wellington Hampshire, MD;  Location: [place] [facility];  Service: Cardiovascular;  Laterality: N/A;   LEFT HEART CATH AND CORONARY ANGIOGRAPHY N/A 02/13/2022   Procedure: LEFT HEART CATH AND CORONARY ANGIOGRAPHY;  Surgeon: Leonie Man, MD;  Location: [place] [facility];  Service: Cardiovascular;  Laterality: N/A;   LEFT HEART CATH AND CORS/GRAFTS ANGIOGRAPHY N/A 05/20/2021   Procedure: LEFT HEART CATH AND CORS/GRAFTS ANGIOGRAPHY;  Surgeon: Wellington Hampshire, MD;  Location: [place] [facility];  Service: Cardiovascular;  Laterality: N/A;   TYMPANOSTOMY TUBE PLACEMENT Bilateral     Social History:  reports that he has been smoking cigarettes. He has a 2.50 pack-year smoking history. He has been exposed to tobacco smoke. He has never used smokeless tobacco. He reports current alcohol use of about 1.0 standard drink of alcohol per week. He reports current drug use. Drug: Codeine.  Family History:  Family History  Problem Relation Age of Onset   Coronary artery disease Mother    Arrhythmia Mother        Pacemaker placed in 2021   Heart failure Mother    Coronary artery disease Father      Prior to Admission medications   Medication Sig Start Date End Date Taking? Authorizing Provider  albuterol (VENTOLIN HFA) 108 (90 Base) MCG/ACT inhaler Inhale 2 puffs into the lungs every 6 (six) hours as needed for wheezing or shortness of breath. 04/25/22 07/24/22  Trudi Ida, NP  aspirin EC 81 MG tablet Take 1 tablet (81 mg total) by mouth daily. Swallow whole. 03/17/22   [name], PA-C  clopidogrel (PLAVIX) 75 MG tablet Take 1 tablet (75 mg total) by mouth daily. 03/17/22   Dunn, Areta Haber, PA-C  empagliflozin (JARDIANCE) 10 MG TABS tablet Take 10 mg by mouth daily.    [provider]  famotidine (PEPCID) 20 MG tablet Take 1 tablet (20 mg total) by mouth 2 (two) times daily. 03/14/22   Renee Rival, FNP  furosemide (LASIX) 40 MG tablet Take 1 tablet (40 mg total) by mouth daily. 03/17/22   Dunn, Areta Haber, PA-C  gabapentin (NEURONTIN) 100 MG capsule Take 2 capsules (200 mg total) by mouth 2 (two) times daily. 03/01/22   Antony Odea, PA-C  losartan (COZAAR) 50 MG tablet Take 1 tablet (50 mg total) by mouth daily. 03/17/22   [name], PA-C  metoprolol succinate (TOPROL-XL) 25 MG 24 hr tablet Take 1 tablet (25 mg total) by mouth daily. 03/17/22   Dunn, Areta Haber, PA-C  nitroGLYCERIN (NITROSTAT) 0.4 MG SL tablet Place 1 tablet (0.4 mg total) under the tongue every 5 (five) minutes as needed for chest pain (if chest pain not resolved after 3 doses, please call 911). 03/17/22   Idolina Primer, Areta Haber, PA-C  rosuvastatin (CRESTOR) 40 MG tablet Take 1 tablet (40 mg total) by mouth daily. 04/25/22   Trudi Ida, NP  spironolactone (ALDACTONE) 25 MG tablet Take 1 tablet (25 mg total) by mouth daily.  03/17/22   [name], PA-C    Physical Exam: Vitals:   05/01/22 0755 05/01/22 0800 05/01/22 0830 05/01/22 0930  BP: (!) 133/99 (!) 123/91 114/83 121/84  Pulse: (!) 103 100 93 91  Resp: '16 17 11 20  '$ Temp:      TempSrc:      SpO2: 96% 97% 96% 96%  Weight:      Height:       General: Not in acute distress HEENT:       Eyes: PERRL, EOMI, no scleral icterus.       ENT: No discharge from the ears and nose, no pharynx injection, no tonsillar enlargement.        Neck: No JVD, no bruit, no mass felt. Heme: No neck lymph node enlargement. Cardiac: S1/S2, RRR, No murmurs, No gallops or rubs. Respiratory: No rales, wheezing, rhonchi or rubs. GI: Soft, nondistended, nontender, no rebound pain, no organomegaly, BS present. GU: No hematuria Ext: No pitting leg edema bilaterally. 1+DP/PT pulse bilaterally. Musculoskeletal: No joint deformities, No joint redness or warmth, no limitation of ROM in spin. Skin: No rashes.  Neuro: Alert, oriented X3, cranial nerves II-XII grossly intact, moves all  extremities normally.  Psych: Patient is not psychotic, no suicidal or hemocidal ideation.  Labs on Admission: I have personally reviewed following labs and imaging studies  CBC: Recent Labs  Lab 05/01/22 0800  WBC 12.8*  NEUTROABS 8.2*  HGB 14.3  HCT 44.2  MCV 83.6  PLT [id number]*   Basic Metabolic Panel: Recent Labs  Lab 05/01/22 0800  NA 135  K 3.8  CL 109  CO2 19*  GLUCOSE 142*  BUN 18  CREATININE 1.40*  CALCIUM 8.6*   GFR: Estimated Creatinine Clearance: 79.5 mL/min (A) (by C-G formula based on SCr of 1.4 mg/dL (H)). Liver Function Tests: Recent Labs  Lab 05/01/22 0800  AST 26  ALT 23  ALKPHOS 104  BILITOT 0.3  PROT 7.8  ALBUMIN 3.7   No results for input(s): "LIPASE", "AMYLASE" in the last 168 hours. No results for input(s): "AMMONIA" in the last 168 hours. Coagulation Profile: Recent Labs  Lab 05/01/22 0802  INR 1.0   Cardiac Enzymes: No results for input(s): "CKTOTAL", "CKMB", "CKMBINDEX", "TROPONINI" in the last 168 hours. BNP (last 3 results) No results for input(s): "PROBNP" in the last 8760 hours. HbA1C: No results for input(s): "HGBA1C" in the last 72 hours. CBG: No results for input(s): "GLUCAP" in the last 168 hours. Lipid Profile: No results for input(s): "CHOL", "HDL", "LDLCALC", "TRIG", "CHOLHDL", "LDLDIRECT" in the last 72 hours. Thyroid Function Tests: No results for input(s): "TSH", "T4TOTAL", "FREET4", "T3FREE", "THYROIDAB" in the last 72 hours. Anemia Panel: No results for input(s): "VITAMINB12", "FOLATE", "FERRITIN", "TIBC", "IRON", "RETICCTPCT" in the last 72 hours. Urine analysis:    Component Value Date/Time   COLORURINE YELLOW 03/25/2022 1018   APPEARANCEUR HAZY (A) 03/25/2022 1018   APPEARANCEUR Clear 06/30/2013 1513   LABSPEC 1.015 03/25/2022 1018   LABSPEC 1.015 06/30/2013 1513   PHURINE 6.0 03/25/2022 1018   GLUCOSEU >1,000 (A) 03/25/2022 1018   GLUCOSEU Negative 06/30/2013 1513   HGBUR SMALL (A) 03/25/2022 1018    BILIRUBINUR NEGATIVE 03/25/2022 1018   BILIRUBINUR neg 03/17/2022 1158   BILIRUBINUR Negative 06/30/2013 1513   KETONESUR NEGATIVE 03/25/2022 1018   PROTEINUR NEGATIVE 03/25/2022 1018   UROBILINOGEN 0.2 03/17/2022 1158   NITRITE NEGATIVE 03/25/2022 1018   LEUKOCYTESUR NEGATIVE 03/25/2022 1018   LEUKOCYTESUR Negative 06/30/2013 1513   Sepsis Labs: @  LABRCNTIP(procalcitonin:4,lacticidven:4) )No results found for this or any previous visit (from the past 240 hour(s)).   Radiological Exams on Admission: DG Chest Portable 1 View  Result Date: 05/01/2022 CLINICAL DATA:  Chest pain EXAM: PORTABLE CHEST 1 VIEW COMPARISON:  Chest x-ray dated March 17, 2022 FINDINGS: The heart size and mediastinal contours are within normal limits. Prior median sternotomy. Left atrial occlusion clip. Linear opacity of the lower left lung, likely due to scarring or atelectasis. Both lungs are otherwise clear. The visualized skeletal structures are unremarkable. IMPRESSION: No active disease. Electronically Signed   By: Yetta Glassman M.D.   On: 05/01/2022 08:19      Assessment/Plan Principal Problem:   Chest pain Active Problems:   NSTEMI (non-ST elevated myocardial infarction) (HCC)   CAD (coronary artery disease)   Stage 3a chronic kidney disease (HCC)   Tobacco use   Polysubstance abuse (HCC)   Hypertension   Hyperlipidemia   Leukocytosis   Chronic diastolic CHF (congestive heart failure) (HCC)   Obesity with body mass index (BMI) of 30.0 to 39.9   Assessment and Plan:  Chest pain, CAD and NSTEMI: trop  163. Pt has positional chest pain, worse in the night or when laying down.  Potential differential diagnosis is pericarditis. Consulted Dr. Rockey Situ of card.  - admit to tele bed as inpatient - IV heparin -Colchicine 0.6 mg twice daily per Dr. Rockey Situ - Trend Trop - prn Nitroglycerin, percocet  - Apirin, plavix and Crestor  - Risk factor stratification: will check FLP. Pt had A1c 5.2 on  04/14/22 - check UDS - 2d echo  Stage 3a chronic kidney disease ([place]): Close to baseline.  Recent baseline creatinine 1.2-1.5.  His creatinine is 1.40, BUN 18, GFR> 60 today -f/u with BMP  Tobacco use and polysubstance abuse (HCC) -check UDS -nicotine patch  Hypertension: -IV hydralazine as needed -Cozaar, metoprolol  Hyperlipidemia -Crestor  Chronic diastolic CHF: 2D echo on [id number] showed a EF of 65-70% with grade 1 diastolic dysfunction.  Patient does not have leg edema or JVD.  Does not seem to have CHF exacerbation.  He seems to have abdominal swelling. -Continue home Lasix and spironolactone -Give 40 mg of Lasix by IV per Dr. Rockey Situ  Leukocytosis: WBC 12.8.  No fever.  No signs of infection.  Likely reactive -Follow-up with CBC  Obesity with body mass index (BMI) of 30.0 to 39.9: Body weight 1 1.4 kg, BMI 30.32 -Encouraged losing weight -Exercise and healthy diet -Jardiance       DVT ppx: on IV Heparin      Code Status: Full code  Family Communication: not done, no family member is at bed side.   Disposition Plan:  Anticipate discharge back to previous environment  Consults called:  Dr. Rockey Situ of card  Admission status and Level of care: Telemetry Cardiac:   as inpt      Dispo: The patient is from: Home              Anticipated d/c is to: Home              Anticipated d/c date is: 2 days              Patient currently is not medically stable to d/c.    Severity of Illness:  The appropriate patient status for this patient is INPATIENT. Inpatient status is judged to be reasonable and necessary in order to provide the required intensity of service to ensure the patient's safety. The patient's presenting  symptoms, physical exam findings, and initial radiographic and laboratory data in the context of their chronic comorbidities is felt to place them at high risk for further clinical deterioration. Furthermore, it is not anticipated that the patient will be  medically stable for discharge from the hospital within 2 midnights of admission.   * I certify that at the point of admission it is my clinical judgment that the patient will require inpatient hospital care spanning beyond 2 midnights from the point of admission due to high intensity of service, high risk for further deterioration and high frequency of surveillance required.*       Date of Service 05/01/2022    Ivor Costa [facility]   If 7PM-7AM, please contact night-coverage [email or web address] 05/01/2022, 10:12 AM

## 2022-05-01 NOTE — ED Provider Notes (Signed)
[facility] Provider Note    Event Date/Time   First MD Initiated Contact with Patient 05/01/22 [id number]     (approximate)   History   Chest Pain   HPI  Logan Crawford is a 47 y.o. male extensive cardiac history status post recent two-vessel CABG complicated perioperative state with shock requiring balloon pump AKI on dual antiplatelet therapy after discharge from the hospital reportedly compliant with his medications and denies any cocaine use presents to the ER for evaluation of chest pain intermittent lasting 10 to 15 minutes waxing and waning.  Does feel like it is worse at night this evening.  Also worsened with exertion.  Denies any shortness of breath.  No nausea or vomiting.  Does not recall if he was diaphoretic during these episodes.  Denies any lower extremity swelling.     Physical Exam   Triage Vital Signs: ED Triage Vitals  Enc Vitals Group     BP 05/01/22 0750 131/89     Pulse Rate 05/01/22 0750 (!) 103     Resp 05/01/22 0750 20     Temp 05/01/22 0750 97.6 F (36.4 C)     Temp Source 05/01/22 0750 Oral     SpO2 05/01/22 0750 96 %     Weight 05/01/22 0747 223 lb 8.7 oz (101.4 kg)     Height 05/01/22 0747 6' (1.829 m)     Head Circumference --      Peak Flow --      Pain Score 05/01/22 0747 8     Pain Loc --      Pain Edu? --      Excl. in [place]? --     Most recent vital signs: Vitals:   05/01/22 0800 05/01/22 0830  BP: (!) 123/91 114/83  Pulse: 100 93  Resp: 17 11  Temp:    SpO2: 97% 96%     Constitutional: Alert  Eyes: Conjunctivae are normal.  Head: Atraumatic. Nose: No congestion/rhinnorhea. Mouth/Throat: Mucous membranes are moist.   Neck: Painless ROM.  Cardiovascular:   Good peripheral circulation. Respiratory: Normal respiratory effort.  No retractions.  Gastrointestinal: Soft and nontender.  Musculoskeletal:  no deformity Neurologic:  MAE spontaneously. No gross focal neurologic deficits are appreciated.  Skin:   Skin is warm, dry and intact. No rash noted. Psychiatric: Mood and affect are normal. Speech and behavior are normal.    ED Results / Procedures / Treatments   Labs (all labs ordered are listed, but only abnormal results are displayed) Labs Reviewed  CBC WITH DIFFERENTIAL/PLATELET - Abnormal; Notable for the following components:      Result Value   WBC 12.8 (*)    Platelets 459 (*)    Neutro Abs 8.2 (*)    Eosinophils Absolute 1.1 (*)    All other components within normal limits  COMPREHENSIVE METABOLIC PANEL - Abnormal; Notable for the following components:   CO2 19 (*)    Glucose, Bld 142 (*)    Creatinine, Ser 1.40 (*)    Calcium 8.6 (*)    All other components within normal limits  TROPONIN I (HIGH SENSITIVITY) - Abnormal; Notable for the following components:   Troponin I (High Sensitivity) 163 (*)    All other components within normal limits     EKG  ED ECG REPORT I, Merlyn Lot, the attending physician, personally viewed and interpreted this ECG.   Date: 05/01/2022  EKG Time: 7:48  Rate: 105  Rhythm: sinus  Axis: normal  Intervals: normal qt  ST&T Change: anterolateral st and tw abn, unchanged from previous tracing    RADIOLOGY Please see ED Course for my review and interpretation.  I personally reviewed all radiographic images ordered to evaluate for the above acute complaints and reviewed radiology reports and findings.  These findings were personally discussed with the patient.  Please see medical record for radiology report.     PROCEDURES:  Critical Care performed: Yes, see critical care procedure note(s)  .Critical Care  Performed by: Merlyn Lot, MD Authorized by: Merlyn Lot, MD   Critical care provider statement:    Critical care time (minutes):  34   Critical care was necessary to treat or prevent imminent or life-threatening deterioration of the following conditions:  Cardiac failure   Critical care was time spent  personally by me on the following activities:  Ordering and performing treatments and interventions, ordering and review of laboratory studies, ordering and review of radiographic studies, pulse oximetry, re-evaluation of patient's condition, review of old charts, obtaining history from patient or surrogate, examination of patient, evaluation of patient's response to treatment, discussions with primary provider, discussions with consultants and development of treatment plan with patient or surrogate    MEDICATIONS ORDERED IN ED: Medications  nitroGLYCERIN (NITROSTAT) SL tablet 0.4 mg (has no administration in time range)  oxyCODONE-acetaminophen (PERCOCET/ROXICET) 5-325 MG per tablet 1 tablet (1 tablet Oral Given 05/01/22 0851)     IMPRESSION / MDM / [place] / ED COURSE  I reviewed the triage vital signs and the nursing notes.                              Differential diagnosis includes, but is not limited to, ACS, pericarditis, esophagitis, boerhaaves, pe, dissection, pna, bronchitis, costochondritis   Patient presenting to the ER for evaluation of symptoms as described above.  Based on symptoms, risk factors and considered above differential, this presenting complaint could reflect a potentially life-threatening illness therefore the patient will be placed on continuous pulse oximetry and telemetry for monitoring.  Laboratory evaluation will be sent to evaluate for the above complaints.      Clinical Course as of 05/01/22 0904  Thu May 01, 2022  0812 Chest x-ray my review and interpretation without evidence of consolidation or pneumothorax. [PR]  [id number] Troponin is elevated but downtrending from previous.  His pain right now is mild compared to what he presented to the ER for.  Based on his cardiac history will consult cardiology for further recommendations. [PR]  0902 Discussed case in consultation with Dr. Rockey Situ of cardiology.  Agrees with plan for heparinization.  I will  consult hospitalist for admission.  The patient does appear stable for admission for medical workup. [PR]    Clinical Course User Index [PR] Merlyn Lot, MD   FINAL CLINICAL IMPRESSION(S) / ED DIAGNOSES   Final diagnoses:  Unstable angina ([place])     Rx / DC Orders   ED Discharge Orders     None        Note:  This document was prepared using Dragon voice recognition software and may include unintentional dictation errors.    Merlyn Lot, MD 05/01/22 [phone]

## 2022-05-01 NOTE — Consult Note (Signed)
Cardiology Consultation   Patient ID: [name] MRN: [id number]; DOB: [birth year]  Admit date: 05/01/2022 Date of Consult: 05/01/2022  PCP:  Renee Rival, [place] [facility] Cardiologist:  Kate Sable, MD        Patient Profile:   Logan Crawford is a 47 y.o. male with a hx of coronary artery disease with NSTEMI status post PCI/DES to the LAD in 12/2019, status post PCI to the LAD due to in-stent restenosis, medication nonadherence, and status post stent CABG x 3 vessel in 01/2022, ischemic cardiomyopathy, hyperlipidemia, hypertension, tobacco use, polysubstance abuse with cocaine who is being seen 05/01/2022 for the evaluation of chest pain at the request of Dr. Blaine Hamper.  History of Present Illness:   Logan Crawford had previously been admitted to the hospital 11/2019 with progressive weakness, fatigue, dyspnea, cough and diarrhea.  Was found to be COVID-positive.  High-sensitivity troponins peaked at 32.  Echocardiogram revealed LVEF of 55%, no regional wall motion abnormalities, G1 DD.  He was readmitted to the hospital 12/2019 with an NSTEMI with high-sensitivity troponin peaking at 3520.  Repeat echocardiogram revealed LVEF 50%, moderate hypokinesis in the mid apical anteroseptal wall, mild LVH and mild aortic valve sclerosis without evidence of stenosis.  Left heart catheterization showed severe one-vessel coronary artery disease with 90% stenosis in the mid LAD which was show to be likely the culprit for the NSTEMI.  There was 60% ostial proximal RCA stenosis, 40% proximal to mid RCA stenosis 40% distal LAD stenosis.  LVEF 45% with moderate to severe anterior apical hypokinesis.  He underwent successful PCI/DES to the mid LAD.  Lexiscan MPI 12/13/2020 showed small region of ischemia in the apical wall versus concern for processing overall considered low risk study.  He was readmitted to the hospital 04/2021 with chest pain and troponin peak 175.  Urine drug screen  positive for cocaine.  Echocardiogram revealed LVEF of 50-55%, hypokinesis of the mid distal anteroseptal/septal and apical region, G1 DD, mild mitral regurgitation, aortic valve sclerosis without evidence of stenosis.  Left heart cath showed severe one-vessel coronary artery disease and due to distal edge restenosis of the previously placed stent to the LAD.  In addition there was extensive plaque distal to the stent involving the whole mid segment.  He underwent successful complex IVUS guided bifurcation angioplasty and DES placement to the mid LAD.  Another stent was added just delayed due to suspected distal AH dissection.  PTCA was performed to the ostial D2 before placing the stent.  Echocardiogram 06/2021 demonstrated an EF of 60 to 65%, no regional wall motion abnormalities, G2 DD, and mild mitral regurgitation.  He returned to the hospital 11/16/2021 through 11/19/2021 with unstable angina with troponin peaking at 29.  He had not been taking his medication and urine drug screen was positive for cocaine.  Echocardiogram revealed LVEF of 60 to 65%, no regional wall motion abnormalities, moderate concentric LVH, G1 DD, trivial mitral regurgitation, aortic valve sclerosis without evidence of stenosis.  Left heart catheterization revealed two-vessel coronary artery disease.  Patient had overlapping LAD stents with evidence of significant in-stent restenosis in the proximal segment.  Borderline 60% stenosis distal to the stent.  Moderate RCA disease that distal stenosis appears worse.  Intracoronary nitroglycerin was given which improved the overall appearance of the coronary arteries.  LV systolic function with moderate elevated LVEDP, intervention was deferred at that time given overall difficult situation due to patient poor compliance with medical therapy as  well as continued cocaine use.  He presented back to the [facility] emergency department and 02/12/2022 with complaints of chest pain.  High-sensitivity troponin  peaked at 839.  Limited echo revealed LVEF 60 to 65%, no regional wall motion abnormalities, mild mitral regurgitation.  He underwent left heart catheterization 02/13/2022 which revealed progression of severe multivessel coronary artery disease with 2 potential culprit lesions distal-mid RCA ulcerated 95% ruptured plaque in distal mid LAD stent edge 99% with diffuse disease elsewhere recommendation was to restart IV heparin 2 hours after sheath removal CVTS consultation was completed and patient was initiated with transfer to [facility] [place].  He underwent CABG on 02/19/2022 x 3 vessels with the LIMA-diagonal, SVG-LAD, SVG-RCA, and left atrial appendage closure. he was noted to have significant intraoperative hide per kalemia and bradycardic arrest prior to the initiation of cardiopulmonary bypass.  Intraoperatively he did require placement of the intra-aortic balloon pump and was placed on multiple drips for inotropic and vasopressor support.  He was discharged from the facility 02/26/2022.  Posthospitalization course was complicated by left pleural effusion requiring thoracentesis where there was 750 mL of dark red fluid removed on 03/10/2022.  He has been followed by advanced heart failure service and required Furoscrix.    He was last seen in clinic 04/25/2022 where he stated that he was doing very well.  He had declined cardiac rehab secondary to no insurance and had routinely playing pool.  He continues to endorse some confusion over the medications he is taken as several of them stopped and restarted.  He says he was using rescue albuterol inhaler several times during the day but states he feels like he was unable to take a deep breath.  At that time continued to deny chest pain, palpitations, PND or and orthopnea.  He presented to the [facility] emergency department on 05/01/2022 with complaints of chest pain.  States it feels worse when he is trying to lie down in the evening and often times sitting forward will  alleviate his discomfort.  He rates it 4 out of 10 on the pain scale and states that it does have radiation into his arms.  He states that he has been playing a lot of pool and has been stretching and it does not feel as though that is muscle stretching.  It has been waxing and waning stating that it lasts for 10 to 15 minutes.  He has not been able to identify any alleviating or aggravating factors.  He endorses some abdominal bloating that he states is where he holds his fluid.  He denies any associated symptoms of shortness of breath, nausea, vomiting or diaphoreses.  He denies any missed medications or recent drug use.  Initial vitals: Blood pressure 131/89, pulse 103, respirations of 20, temperature of 97.6  Pertinent labs: WBCs 12.8, platelets 459, CO2 19, blood glucose 142, serum creatinine 1.40, calcium 8.6, high-sensitivity troponin of 163 and 173, urine drug screen positive for cocaine and cannabinoids, BNP 40.4  Imaging: Chest x-ray reveals the heart size and mediastinal contours within normal limits, prior median sternotomy, left apical closure clip, linear opacity of the left lower lung likely due to scarring or atelectasis, both lungs are otherwise clear, and visualized skeletal structures are unremarkable  Medications given in the emergency department: Nitrostat 0.4 mg sublingual, oxycodone/and acetaminophen 5/325 x 1 dose, heparin infusion  Cardiology was consulted due to previous cardiac history and elevated high-sensitivity troponin.   Past Medical History:  Diagnosis Date   CAD (coronary  artery disease)    a. 11/2019 NSTEMI/PCI: LM min irregs, LAD 29m(2.75x22 Resolute Onyx DES), 40d, D2 min irregs, LCX/LPAV min irregs, RCA 60ost, 40p/m, EF 45-50%; b. 11/2020 MV: EF 43%, small area of apical ischemia ->? over processing-->low risk.   Hyperlipidemia    Hypertension    Ischemic cardiomyopathy    a. 11/2019 LV Gram: EF 45-50%; b. 12/2019 Echo: EF 50% w/ mod mid-apical anteroseptal  HK, GrI DD, nl RV fxn, mild Ao sclerosis.   Tobacco use 11/25/2019    Past Surgical History:  Procedure Laterality Date   CORONARY ARTERY BYPASS GRAFT N/A 02/19/2022   Procedure: CORONARY ARTERY BYPASS GRAFTING (CABG) X THREE, USING ENDOSCOPICALLY HARVESTED RIGHT GREATER SAPHENOUS VEIN  AND LEFT ATRIAL APPENDAGE CLIPPING;  Surgeon: ENeomia Glass MD;  [facility]  Service: Open Heart Surgery;  Laterality: N/A;   CORONARY STENT INTERVENTION N/A 01/24/2020   Procedure: CORONARY STENT INTERVENTION;  Surgeon: AWellington Hampshire MD;  [facility];  Service: Cardiovascular;  Laterality: N/A;   CORONARY STENT INTERVENTION N/A 05/20/2021   Procedure: CORONARY STENT INTERVENTION;  Surgeon: AWellington Hampshire MD;  [facility];  Service: Cardiovascular;  Laterality: N/A;   INTRAVASCULAR ULTRASOUND/IVUS N/A 05/20/2021   Procedure: Intravascular Ultrasound/IVUS;  Surgeon: AWellington Hampshire MD;  [facility];  Service: Cardiovascular;  Laterality: N/A;   IRRIGATION AND DEBRIDEMENT KNEE Left    LEFT HEART CATH AND CORONARY ANGIOGRAPHY N/A 01/24/2020   Procedure: LEFT HEART CATH AND CORONARY ANGIOGRAPHY;  Surgeon: AWellington Hampshire MD;  [facility];  Service: Cardiovascular;  Laterality: N/A;   LEFT HEART CATH AND CORONARY ANGIOGRAPHY N/A 11/18/2021   Procedure: LEFT HEART CATH AND CORONARY ANGIOGRAPHY;  Surgeon: AWellington Hampshire MD;  [facility];  Service: Cardiovascular;  Laterality: N/A;   LEFT HEART CATH AND CORONARY ANGIOGRAPHY N/A 02/13/2022   Procedure: LEFT HEART CATH AND CORONARY ANGIOGRAPHY;  Surgeon: HLeonie Man MD;  [facility];  Service: Cardiovascular;  Laterality: N/A;   LEFT HEART CATH AND CORS/GRAFTS ANGIOGRAPHY N/A 05/20/2021   Procedure: LEFT HEART CATH AND CORS/GRAFTS ANGIOGRAPHY;  Surgeon: AWellington Hampshire MD;  [facility];  Service:  Cardiovascular;  Laterality: N/A;   TYMPANOSTOMY TUBE PLACEMENT Bilateral      Home Medications:  Prior to Admission medications   Medication Sig Start Date End Date Taking? Authorizing Provider  albuterol (VENTOLIN HFA) 108 (90 Base) MCG/ACT inhaler Inhale 2 puffs into the lungs every 6 (six) hours as needed for wheezing or shortness of breath. 04/25/22 07/24/22  WTrudi Ida NP  aspirin EC 81 MG tablet Take 1 tablet (81 mg total) by mouth daily. Swallow whole. 03/17/22   DRise Mu PA-C  clopidogrel (PLAVIX) 75 MG tablet Take 1 tablet (75 mg total) by mouth daily. 03/17/22   Dunn, RAreta Haber PA-C  empagliflozin (JARDIANCE) 10 MG TABS tablet Take 10 mg by mouth daily.    [provider]  famotidine (PEPCID) 20 MG tablet Take 1 tablet (20 mg total) by mouth 2 (two) times daily. 03/14/22   PRenee Rival FNP  furosemide (LASIX) 40 MG tablet Take 1 tablet (40 mg total) by mouth daily. 03/17/22   Dunn, RAreta Haber PA-C  gabapentin (NEURONTIN) 100 MG capsule Take 2 capsules (200 mg total) by mouth 2 (two) times daily. 03/01/22   RAntony Odea PA-C  losartan (COZAAR) 50 MG  tablet Take 1 tablet (50 mg total) by mouth daily. 03/17/22   [name], PA-C  metoprolol succinate (TOPROL-XL) 25 MG 24 hr tablet Take 1 tablet (25 mg total) by mouth daily. 03/17/22   Dunn, Areta Haber, PA-C  nitroGLYCERIN (NITROSTAT) 0.4 MG SL tablet Place 1 tablet (0.4 mg total) under the tongue every 5 (five) minutes as needed for chest pain (if chest pain not resolved after 3 doses, please call 911). 03/17/22   Idolina Primer, Areta Haber, PA-C  rosuvastatin (CRESTOR) 40 MG tablet Take 1 tablet (40 mg total) by mouth daily. 04/25/22   Trudi Ida, NP  spironolactone (ALDACTONE) 25 MG tablet Take 1 tablet (25 mg total) by mouth daily. 03/17/22   [name], PA-C    Inpatient Medications: Scheduled Meds:  [START ON 05/02/2022] aspirin EC  81 mg Oral Daily   clopidogrel  75 mg Oral Daily   colchicine  0.6 mg Oral BID    empagliflozin  10 mg Oral QPM   famotidine  20 mg Oral BID   [START ON 05/02/2022] furosemide  40 mg Oral Daily   gabapentin  200 mg Oral BID   losartan  50 mg Oral Daily   metoprolol succinate  25 mg Oral Daily   nicotine  21 mg Transdermal Daily   rosuvastatin  40 mg Oral QPM   spironolactone  25 mg Oral Daily   Continuous Infusions:  heparin 1,250 Units/hr (05/01/22 0949)   PRN Meds: acetaminophen, albuterol, dextromethorphan-guaiFENesin, hydrALAZINE, nitroGLYCERIN, ondansetron (ZOFRAN) IV, oxyCODONE-acetaminophen  Allergies:   No Known Allergies  Social History:   Social History   Socioeconomic History   Marital status: Single    Spouse name: Not on file   Number of children: 3   Years of education: Not on file   Highest education level: Not on file  Occupational History   Not on file  Tobacco Use   Smoking status: Every Day    Packs/day: 0.25    Years: 10.00    Total pack years: 2.50    Types: Cigarettes    Passive exposure: Past   Smokeless tobacco: Never   Tobacco comments:    Smokes 1-2 cigarettes daily    Vaping Use   Vaping Use: Never used  Substance and Sexual Activity   Alcohol use: Yes    Alcohol/week: 1.0 standard drink of alcohol    Types: 1 Shots of liquor per week    Comment: socially   Drug use: Yes    Types: Codeine   Sexual activity: Yes  Other Topics Concern   Not on file  Social History Narrative   Lives with his uncle    Social Determinants of Health   Financial Resource Strain: Not on file  Food Insecurity: No Food Insecurity (05/01/2022)   Hunger Vital Sign    Worried About Running Out of Food in the Last Year: Never true    Ran Out of Food in the Last Year: Never true  Transportation Needs: No Transportation Needs (05/01/2022)   PRAPARE - Hydrologist (Medical): No    Lack of Transportation (Non-Medical): No  Physical Activity: Not on file  Stress: Not on file  Social Connections: Not on file  Intimate  Partner Violence: Not At Risk (05/01/2022)   Humiliation, Afraid, Rape, and Kick questionnaire    Fear of Current or Ex-Partner: No    Emotionally Abused: No    Physically Abused: No    Sexually Abused: No  Family History:    Family History  Problem Relation Age of Onset   Coronary artery disease Mother    Arrhythmia Mother        Pacemaker placed in 2021   Heart failure Mother    Coronary artery disease Father      ROS:  Please see the history of present illness.  Review of Systems  Respiratory:  Positive for shortness of breath.   Cardiovascular:  Positive for chest pain.  Gastrointestinal:        Abdominal bloating     All other ROS reviewed and negative.     Physical Exam/Data:   Vitals:   05/01/22 1000 05/01/22 1030 05/01/22 1130 05/01/22 1300  BP: 101/81 (!) 123/91 114/86 110/85  Pulse: 90 84 86 90  Resp:  '19 19 19  '$ Temp:      TempSrc:      SpO2: 96% 96% 96% 97%  Weight:      Height:       No intake or output data in the 24 hours ending 05/01/22 1343    05/01/2022    7:47 AM 04/25/2022    2:42 PM 04/15/2022   10:40 AM  Last 3 Weights  Weight (lbs) 223 lb 8.7 oz 223 lb 9.6 oz 226 lb 3.2 oz  Weight (kg) 101.4 kg 101.424 kg 102.604 kg     Body mass index is 30.32 kg/m.  General:  Well nourished, well developed, in no acute distress HEENT: normal Neck: no JVD Vascular: No carotid bruits; Distal pulses 2+ bilaterally Cardiac:  normal S1, S2; RRR; no murmur  Lungs:  clear to auscultation bilaterally, no wheezing, rhonchi or rales  Abd: soft, nontender, no hepatomegaly  Ext: no edema Musculoskeletal:  No deformities, BUE and BLE strength normal and equal Skin: warm and dry  Neuro:  CNs 2-12 intact, no focal abnormalities noted Psych:  Normal affect   EKG:  The EKG was personally reviewed and demonstrates: Sinus rhythm with a rate of 89, LVH, old inferior defect Telemetry:  Telemetry was personally reviewed and demonstrates: Sinus rhythm rates in the  80s  Relevant CV Studies: Echocardiogram ordered and pending  TTE 03/19/22  1. Left ventricular ejection fraction, by estimation, is 65 to 70%. The  left ventricle has normal function. The left ventricle has no regional  wall motion abnormalities. There is mild left ventricular hypertrophy.  Left ventricular diastolic parameters  are consistent with Grade I diastolic dysfunction (impaired relaxation).   2. Right ventricular systolic function is normal. The right ventricular  size is not well visualized.   3. The mitral valve is normal in structure. No evidence of mitral valve  regurgitation.   4. The aortic valve was not well visualized. Aortic valve regurgitation  is not visualized. Aortic valve sclerosis/calcification is present,  without any evidence of aortic stenosis.   5. The inferior vena cava is normal in size with greater than 50%  respiratory variability, suggesting right atrial pressure of 3 mmHg.   Laboratory Data:  High Sensitivity Troponin:   Recent Labs  Lab 05/01/22 0800 05/01/22 0926  TROPONINIHS 163* 173*     Chemistry Recent Labs  Lab 05/01/22 0800  NA 135  K 3.8  CL 109  CO2 19*  GLUCOSE 142*  BUN 18  CREATININE 1.40*  CALCIUM 8.6*  GFRNONAA >60  ANIONGAP 7    Recent Labs  Lab 05/01/22 0800  PROT 7.8  ALBUMIN 3.7  AST 26  ALT 23  ALKPHOS 104  BILITOT 0.3   Lipids No results for input(s): "CHOL", "TRIG", "HDL", "LABVLDL", "LDLCALC", "CHOLHDL" in the last 168 hours.  Hematology Recent Labs  Lab 05/01/22 0800  WBC 12.8*  RBC 5.29  HGB 14.3  HCT 44.2  MCV 83.6  MCH 27.0  MCHC 32.4  RDW 15.0  PLT 459*   Thyroid No results for input(s): "TSH", "FREET4" in the last 168 hours.  BNP Recent Labs  Lab 05/01/22 0800  BNP 40.4    DDimer No results for input(s): "DDIMER" in the last 168 hours.   Radiology/Studies:  ECHOCARDIOGRAM COMPLETE  Result Date: 05/01/2022    ECHOCARDIOGRAM REPORT   Patient Name:   Logan Crawford Date of  Exam: 05/01/2022 Medical Rec #:  [id number]        Height:       72.0 in Accession #:    [id number]       Weight:       223.5 lb Date of Birth:  Jul 24, 1975       BSA:          2.234 m Patient Age:    40 years         BP:           123/91 mmHg Patient Gender: M                HR:           84 bpm. Exam Location:  [facility] Procedure: 2D Echo, Cardiac Doppler and Color Doppler Indications:     Chest pain R07.9  History:         Patient has prior history of Echocardiogram examinations, most                  recent 03/19/2022. CAD; Risk Factors:Hypertension. Tobacco use,                  ischemic cardiomyopathy.  Sonographer:     Sherrie Sport Referring Phys:  [id number] Milaya Hora Diagnosing Phys: Ida Rogue MD  Sonographer Comments: Suboptimal apical window. IMPRESSIONS  1. Left ventricular ejection fraction, by estimation, is 55%. The left ventricle has normal function. The left ventricle demonstrates regional wall motion abnormalities (hypokinesis of the distal anteroseptal and periapical region, seen previously 1/24). There is moderate left ventricular hypertrophy. Left ventricular diastolic parameters are consistent with Grade I diastolic dysfunction (impaired relaxation).  2. Right ventricular systolic function is normal. The right ventricular size is normal.  3. The mitral valve is normal in structure. Mild mitral valve regurgitation. No evidence of mitral stenosis.  4. The aortic valve is normal in structure. There is mild calcification of the aortic valve. Aortic valve regurgitation is not visualized. Aortic valve sclerosis/calcification is present, without any evidence of aortic stenosis.  5. The inferior vena cava is normal in size with greater than 50% respiratory variability, suggesting right atrial pressure of 3 mmHg. FINDINGS  Left Ventricle: Left ventricular ejection fraction, by estimation, is 55%. The left ventricle has normal function. The left ventricle demonstrates regional wall motion abnormalities. The  left ventricular internal cavity size was normal in size. There is  moderate left ventricular hypertrophy. Left ventricular diastolic parameters are consistent with Grade I diastolic dysfunction (impaired relaxation). Right Ventricle: The right ventricular size is normal. No increase in right ventricular wall thickness. Right ventricular systolic function is normal. Left Atrium: Left atrial size was normal in size. Right Atrium: Right atrial size was normal in size. Pericardium: There is no evidence of pericardial  effusion. Mitral Valve: The mitral valve is normal in structure. There is mild calcification of the mitral valve leaflet(s). Mild mitral valve regurgitation. No evidence of mitral valve stenosis. Tricuspid Valve: The tricuspid valve is normal in structure. Tricuspid valve regurgitation is not demonstrated. No evidence of tricuspid stenosis. Aortic Valve: The aortic valve is normal in structure. There is mild calcification of the aortic valve. Aortic valve regurgitation is not visualized. Aortic valve sclerosis/calcification is present, without any evidence of aortic stenosis. Aortic valve mean gradient measures 2.0 mmHg. Aortic valve peak gradient measures 3.5 mmHg. Aortic valve area, by VTI measures 5.11 cm. Pulmonic Valve: The pulmonic valve was normal in structure. Pulmonic valve regurgitation is not visualized. No evidence of pulmonic stenosis. Aorta: The aortic root is normal in size and structure. Venous: The inferior vena cava is normal in size with greater than 50% respiratory variability, suggesting right atrial pressure of 3 mmHg. IAS/Shunts: No atrial level shunt detected by color flow Doppler.  LEFT VENTRICLE PLAX 2D LVIDd:         3.50 cm   Diastology LVIDs:         2.00 cm   LV e' medial:    4.03 cm/s LV PW:         1.30 cm   LV E/e' medial:  15.5 LV IVS:        1.50 cm   LV e' lateral:   7.83 cm/s LVOT diam:     2.30 cm   LV E/e' lateral: 8.0 LV SV:         78 LV SV Index:   35 LVOT Area:      4.15 cm  RIGHT VENTRICLE RV Basal diam:  4.00 cm RV Mid diam:    3.70 cm RV S prime:     8.70 cm/s TAPSE (M-mode): 1.3 cm LEFT ATRIUM             Index        RIGHT ATRIUM           Index LA diam:        3.20 cm 1.43 cm/m   RA Area:     15.30 cm LA Vol (A2C):   26.4 ml 11.82 ml/m  RA Volume:   39.80 ml  17.82 ml/m LA Vol (A4C):   34.4 ml 15.40 ml/m LA Biplane Vol: 31.0 ml 13.88 ml/m  AORTIC VALVE AV Area (Vmax):    4.50 cm AV Area (Vmean):   4.51 cm AV Area (VTI):     5.11 cm AV Vmax:           93.30 cm/s AV Vmean:          68.400 cm/s AV VTI:            0.153 m AV Peak Grad:      3.5 mmHg AV Mean Grad:      2.0 mmHg LVOT Vmax:         101.00 cm/s LVOT Vmean:        74.300 cm/s LVOT VTI:          0.188 m LVOT/AV VTI ratio: 1.23  AORTA Ao Root diam: 2.90 cm MITRAL VALVE               TRICUSPID VALVE MV Area (PHT): 3.61 cm    TR Peak grad:   11.8 mmHg MV Decel Time: 210 msec    TR Vmax:        172.00 cm/s MV E velocity:  62.40 cm/s MV A velocity: 68.70 cm/s  SHUNTS MV E/A ratio:  0.91        Systemic VTI:  0.19 m                            Systemic Diam: 2.30 cm Ida Rogue MD Electronically signed by Ida Rogue MD Signature Date/Time: 05/01/2022/12:48:05 PM    Final    DG Chest Portable 1 View  Result Date: 05/01/2022 CLINICAL DATA:  Chest pain EXAM: PORTABLE CHEST 1 VIEW COMPARISON:  Chest x-ray dated March 17, 2022 FINDINGS: The heart size and mediastinal contours are within normal limits. Prior median sternotomy. Left atrial occlusion clip. Linear opacity of the lower left lung, likely due to scarring or atelectasis. Both lungs are otherwise clear. The visualized skeletal structures are unremarkable. IMPRESSION: No active disease. Electronically Signed   By: Yetta Glassman M.D.   On: 05/01/2022 08:19     Assessment and Plan:   Chest pain with elevated high-sensitivity troponins in a patient with a history of coronary artery disease status post CABG x 3 vessels -Who presents to the  emergency department with chest discomfort that is worsening with lying flat and relieved often times by sitting forward -High-sensitivity troponins trended 163 and 173 which is flat -Started on IV heparin for 48 hours from peak -Limited echocardiogram looking for pericardial effusion -Started on 0.6 mg of colchicine twice daily, was not started on ibuprofen due to CKD -Urine drug screen positive for cocaine again -Continued on aspirin, clopidogrel, rosuvastatin -Continue on telemetry monitoring -EKG as needed for pain or changes -No current plans for invasive testing at this time  HFp EF -Previous echocardiogram completed 03/15/2022 revealed LVEF of 65 to 70% -BNP 40.4 -Maintaining oxygen saturations on room air -Abdominal swelling noted -One-time dose of furosemide 40 mg IVP given -Continue PTA furosemide,spironolactone, Toprol-XL, Jardiance -Does not appear to be volume overloaded -Daily weights, I's and O's, low-sodium diet -Will need to continue to follow with advanced heart failure on discharge  Hypertension -Blood pressure 123/91 -Continued on losartan, metoprolol succinate, spironolactone, and furosemide -Vital signs per unit protocol  Hyperlipidemia -Lipid panel pending -Continue on rosuvastatin 40 mg daily  CKD stage IIIa -Serum creatinine 1.4 serum -Baseline serum creatinine 1.2-1.4 -Daily BMP -Monitor urine output -Monitor/trend/replete electrolytes as needed -Avoid nephrotoxic agents were able  Polysubstance abuse -Urine drug screen positive for cocaine and marijuana -Patient states he continues to smoke -NicoDerm CQ patches in place -Total cessation is recommended   Risk Assessment/Risk Scores:     TIMI Risk Score for Unstable Angina or Non-ST Elevation MI:   The patient's TIMI risk score is 4, which indicates a 20% risk of all cause mortality, new or recurrent myocardial infarction or need for urgent revascularization in the next 14 days.  [place]  Heart Association (NYHA) Functional Class NYHA Class II        For questions or updates, please contact [place] Please consult [email or web address] for contact info under    Signed, Tanae Petrosky, NP  05/01/2022 1:43 PM

## 2022-05-01 NOTE — Congregational Nurse Program (Signed)
ANTICOAGULATION CONSULT NOTE - Initial Consult  Pharmacy Consult for heparin Indication: NSTEMI  No Known Allergies  Patient Measurements: Height: 6' (182.9 cm) Weight: 101.4 kg (223 lb 8.7 oz) IBW/kg (Calculated) : 77.6 Heparin Dosing Weight: 98.3 kg  Vital Signs: Temp: 97.6 F (36.4 C) (03/07 0750) Temp Source: Oral (03/07 0750) BP: 121/84 (03/07 0930) Pulse Rate: 91 (03/07 0930)  Labs: Recent Labs    05/01/22 0800  HGB 14.3  HCT 44.2  PLT 459*  CREATININE 1.40*  TROPONINIHS 163*    Estimated Creatinine Clearance: 79.5 mL/min (A) (by C-G formula based on SCr of 1.4 mg/dL (H)).   Medical History: Past Medical History:  Diagnosis Date   CAD (coronary artery disease)    a. 11/2019 NSTEMI/PCI: LM min irregs, LAD 83m(2.75x22 Resolute Onyx DES), 40d, D2 min irregs, LCX/LPAV min irregs, RCA 60ost, 40p/m, EF 45-50%; b. 11/2020 MV: EF 43%, small area of apical ischemia ->? over processing-->low risk.   Hyperlipidemia    Hypertension    Ischemic cardiomyopathy    a. 11/2019 LV Gram: EF 45-50%; b. 12/2019 Echo: EF 50% w/ mod mid-apical anteroseptal HK, GrI DD, nl RV fxn, mild Ao sclerosis.   Tobacco use 11/25/2019    Medications:  No evidence of PTA AC meds  Assessment: Pharmacy consulted to dose heparin in this 47yo M who presents to the ED with extensive cardiac history, status post recent two-vessel CABG on dual antiplatelet therapy.  Troponin elevated.  Baseline Labs: Hgb 14.3, Plt 459, INR 1.0, aPTT ordered    Goal of Therapy:  Heparin level 0.3-0.7 units/ml Monitor platelets by anticoagulation protocol: Yes   Plan:  Give 4000 units bolus x 1 Start heparin infusion at 1250 units/hr Check anti-Xa level in 6 hours and daily while on heparin Continue to monitor H&H and platelets  JAlison Murray3/08/2022,9:39 AM

## 2022-05-01 NOTE — Progress Notes (Signed)
*  PRELIMINARY RESULTS* Echocardiogram 2D Echocardiogram has been performed.  Sherrie Sport 05/01/2022, 10:52 AM

## 2022-05-01 NOTE — ED Triage Notes (Signed)
Pt here with cp that started a few days ago. Pt had heart surgery 02/16/2022. Pt states he feels a lot of pressure in the center of his chest that radiates to his arm, neck, and back. Pt states pain is worse at night.

## 2022-05-01 NOTE — ED Notes (Signed)
Pt provided hospital bed. Monitor reapplied. No complaints. CB in reach.

## 2022-05-02 ENCOUNTER — Other Ambulatory Visit: Payer: Self-pay

## 2022-05-02 DIAGNOSIS — I259 Chronic ischemic heart disease, unspecified: Secondary | ICD-10-CM

## 2022-05-02 DIAGNOSIS — I200 Unstable angina: Secondary | ICD-10-CM

## 2022-05-02 LAB — BASIC METABOLIC PANEL
Anion gap: 7 (ref 5–15)
BUN: 19 mg/dL (ref 6–20)
CO2: 21 mmol/L — ABNORMAL LOW (ref 22–32)
Calcium: 8.8 mg/dL — ABNORMAL LOW (ref 8.9–10.3)
Chloride: 105 mmol/L (ref 98–111)
Creatinine, Ser: 1.39 mg/dL — ABNORMAL HIGH (ref 0.61–1.24)
GFR, Estimated: >60 mL/min (ref >60)
Glucose, Bld: 106 mg/dL — ABNORMAL HIGH (ref 70–99)
Potassium: 3.8 mmol/L (ref 3.5–5.1)
Sodium: 133 mmol/L — ABNORMAL LOW (ref 135–145)

## 2022-05-02 LAB — LIPID PANEL
Cholesterol: 138 mg/dL (ref 0–200)
HDL: 26 mg/dL — ABNORMAL LOW (ref >40)
LDL Cholesterol: 46 mg/dL (ref 0–99)
Total CHOL/HDL Ratio: 5.3 RATIO
Triglycerides: 331 mg/dL — ABNORMAL HIGH (ref <150)
VLDL: 66 mg/dL — ABNORMAL HIGH (ref 0–40)

## 2022-05-02 LAB — CBC
HCT: 41.5 % (ref 39.0–52.0)
Hemoglobin: 13.3 g/dL (ref 13.0–17.0)
MCH: 27 pg (ref 26.0–34.0)
MCHC: 32 g/dL (ref 30.0–36.0)
MCV: 84.3 fL (ref 80.0–100.0)
Platelets: 390 10*3/uL (ref 150–400)
RBC: 4.92 MIL/uL (ref 4.22–5.81)
RDW: 14.9 % (ref 11.5–15.5)
WBC: 9.3 10*3/uL (ref 4.0–10.5)
nRBC: 0 % (ref 0.0–0.2)

## 2022-05-02 LAB — HEPARIN LEVEL (UNFRACTIONATED)
Heparin Unfractionated: 0.31 IU/mL (ref 0.30–0.70)
Heparin Unfractionated: 0.34 IU/mL (ref 0.30–0.70)

## 2022-05-02 LAB — D-DIMER, QUANTITATIVE: D-Dimer, Quant: 10.35 ug/mL-FEU — ABNORMAL HIGH (ref 0.00–0.50)

## 2022-05-02 MED ORDER — PANTOPRAZOLE SODIUM 40 MG PO TBEC
40.0000 mg | DELAYED_RELEASE_TABLET | Freq: Two times a day (BID) | ORAL | Status: DC
  Administered 2022-05-02 – 2022-05-03 (×3): 40 mg via ORAL
  Filled 2022-05-02 (×3): qty 1

## 2022-05-02 MED ORDER — POLYETHYLENE GLYCOL 3350 17 G PO PACK
17.0000 g | PACK | Freq: Every day | ORAL | Status: DC
  Administered 2022-05-02 – 2022-05-03 (×2): 17 g via ORAL
  Filled 2022-05-02 (×2): qty 1

## 2022-05-02 MED ORDER — MELATONIN 5 MG PO TABS
5.0000 mg | ORAL_TABLET | Freq: Every day | ORAL | Status: DC
  Administered 2022-05-02: 5 mg via ORAL
  Filled 2022-05-02: qty 1

## 2022-05-02 MED ORDER — ALUM & MAG HYDROXIDE-SIMETH 200-200-20 MG/5ML PO SUSP
30.0000 mL | Freq: Once | ORAL | Status: AC
  Administered 2022-05-02: 30 mL via ORAL
  Filled 2022-05-02: qty 30

## 2022-05-02 NOTE — Assessment & Plan Note (Signed)
GERD might be the cause of current symptoms. -Started on PPI

## 2022-05-02 NOTE — Assessment & Plan Note (Signed)
-  Continue home Cozaar and metoprolol -IV hydralazine as needed

## 2022-05-02 NOTE — Congregational Nurse Program (Deleted)
[place] for heparin Indication: NSTEMI  No Known Allergies  Patient Measurements: Height: 6' (182.9 cm) Weight: 101.4 kg (223 lb 8.7 oz) IBW/kg (Calculated) : 77.6 Heparin Dosing Weight: 98.3 kg  Vital Signs: Temp: 97.4 F (36.3 C) (03/07 1900) Temp Source: Oral (03/07 1900) BP: 108/71 (03/08 0045) Pulse Rate: 85 (03/08 0045)  Labs: Recent Labs    05/01/22 0800 05/01/22 0802 05/01/22 0926 05/01/22 1246 05/01/22 1529 05/01/22 1718 05/01/22 1828 05/02/22 0049  HGB 14.3  --   --   --   --   --   --   --   HCT 44.2  --   --   --   --   --   --   --   PLT 459*  --   --   --   --   --   --   --   APTT 25  --   --   --   --   --   --   --   LABPROT  --  13.0  --   --   --   --   --   --   INR  --  1.0  --   --   --   --   --   --   HEPARINUNFRC  --   --   --   --   --  0.24*  --  0.31  CREATININE 1.40*  --   --   --   --   --   --   --   TROPONINIHS 163*  --    < > 195* 203*  --  180*  --    < > = values in this interval not displayed.     Estimated Creatinine Clearance: 79.5 mL/min (A) (by C-G formula based on SCr of 1.4 mg/dL (H)).   Medical History: Past Medical History:  Diagnosis Date   CAD (coronary artery disease)    a. 11/2019 NSTEMI/PCI: LM min irregs, LAD 42m(2.75x22 Resolute Onyx DES), 40d, D2 min irregs, LCX/LPAV min irregs, RCA 60ost, 40p/m, EF 45-50%; b. 11/2020 MV: EF 43%, small area of apical ischemia ->? over processing-->low risk.   Hyperlipidemia    Hypertension    Ischemic cardiomyopathy    a. 11/2019 LV Gram: EF 45-50%; b. 12/2019 Echo: EF 50% w/ mod mid-apical anteroseptal HK, GrI DD, nl RV fxn, mild Ao sclerosis.   Tobacco use 11/25/2019    Medications:  No evidence of PTA AC meds  Assessment: Pharmacy consulted to dose heparin in this 47yo M who presents to the ED with extensive cardiac history, status post recent two-vessel CABG on dual antiplatelet therapy.  Troponin elevated.  Baseline Labs:  Hgb 14.3, Plt 459, INR 1.0, aPTT ordered    Goal of Therapy:  Heparin level 0.3-0.7 units/ml Monitor platelets by anticoagulation protocol: Yes  3/07 1718 HL 0.24, subtherapeutic @ 1250 u/hr 3/08 0049 HL 0.31, therapeutic x 1   Plan:  -Continue heparin infusion rate at 1400 units/hr -Recheck heparin level 6 hours to confirm -Daily CBC while on heparin  NRenda Rolls PharmD, M[facility]3/09/2022 1:29 AM

## 2022-05-02 NOTE — Assessment & Plan Note (Signed)
Counseling was provided -Nicotine patch as needed 

## 2022-05-02 NOTE — Assessment & Plan Note (Signed)
Less likely ACS, troponin peaked at 203 and then trending down.  Also concern of pericarditis and has pain get worse with laying down and improved with sitting up. He was started on heparin infusion-cardiology recommending for 48 hours for his risk of coronary artery disease. He was also started on colchicine. GERD is also on differential so he was started on PPI. -Continue to monitor -Continue with home aspirin, Plavix and statin.

## 2022-05-02 NOTE — Assessment & Plan Note (Signed)
Estimated body mass index is 30.32 kg/m as calculated from the following:   Height as of this encounter: 6' (1.829 m).   Weight as of this encounter: 101.4 kg.   -Counseling was provided

## 2022-05-02 NOTE — Assessment & Plan Note (Signed)
Seems stable. ?-Monitor renal function ?-Avoid nephrotoxins ?

## 2022-05-02 NOTE — Hospital Course (Addendum)
Taken from H&P.   Logan Crawford is a 47 y.o. 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  Associated with mild shortness of breath.  Pain is worse at night and while lying down.  Data reviewed independently and ED Course: pt was found to have trop  163, WBC 12.8, stable renal function, temperature normal, blood pressure 114/83, heart rate 103, 93, RR 20, 11, oxygen saturation 96% on room air.  Chest x-ray negative.  Dr. Rockey Situ of cardiology is consulted.   EKG:  Sinus rhythm, QTc 445, LAE, Q waves in the inferior leads that V4-V6  Patient was started on heparin infusion for concern of NSTEMI and colchicine for concern of pericarditis.  3/8: Vital stable this morning.  Troponin peaked at 203 and now trending down.  Labs with mild hyponatremia at 133 which is clinically nonsignificant.  Renal functions around baseline.  Mild none anion gap metabolic acidosis with bicarb at 21, improved than yesterday.  Lipid panel with elevated triglyceride at 331, HDL 26 and LDL 46.  UDS positive for cocaine and cannabinoid.  BNP normal.  Echo with normal EF, some apical hypokinesis which seems chronic and grade 1 diastolic dysfunction. Cardiology started him on colchicine and recommending heparin for 48 hours. Also started him on PPI for concern of GERD responsible for some of his current symptoms.  3/9: Patient remained hemodynamically stable.  Chest pain improved.  D-dimer was checked by cardiology and found to be significantly elevated at 10.35.  VQ scan was obtained to rule out PE and it was negative.  Lower extremity venous Doppler was negative for DVT.  Recent echocardiogram was negative for any cardiac thrombus.  Patient was advised to follow-up with his cardiologist and primary care provider and need to have oncologic workup to  rule out any underlying malignancy.  Patient never had any colonoscopy.  Patient was given colchicine and Protonix along with his home medications. Patient should not be using metoprolol if continue to use cocaine-have another counseling session and discussion.  Also discussed with sister who is trying to stay over his health associated care.  Patient will continue the rest of his home medications and need to have a close follow-up with his providers for further recommendations.

## 2022-05-02 NOTE — Assessment & Plan Note (Signed)
UDS positive for cannabinoid and cocaine.  Per patient he stopped using cocaine but was using THC Gummies which might be laced?? Extensive counseling was provided. Sister was at bedside who will trying to take care of this.

## 2022-05-02 NOTE — Progress Notes (Signed)
Progress Note   Patient: Logan Crawford [id number] DOB: 26-Jan-1976 DOA: 05/01/2022     1 DOS: the patient was seen and examined on 05/02/2022   Brief hospital course: Taken from H&P.   Logan Crawford is a 47 y.o. 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  Associated with mild shortness of breath.  Pain is worse at night and while lying down.  Data reviewed independently and ED Course: pt was found to have trop  163, WBC 12.8, stable renal function, temperature normal, blood pressure 114/83, heart rate 103, 93, RR 20, 11, oxygen saturation 96% on room air.  Chest x-ray negative.  Dr. Rockey Crawford of cardiology is consulted.   EKG:  Sinus rhythm, QTc 445, LAE, Q waves in the inferior leads that V4-V6  Patient was started on heparin infusion for concern of NSTEMI and colchicine for concern of pericarditis.  3/8: Vital stable this morning.  Troponin peaked at 203 and now trending down.  Labs with mild hyponatremia at 133 which is clinically nonsignificant.  Renal functions around baseline.  Mild none anion gap metabolic acidosis with bicarb at 21, improved than yesterday.  Lipid panel with elevated triglyceride at 331, HDL 26 and LDL 46.  UDS positive for cocaine and cannabinoid.  BNP normal.  Echo with normal EF, some apical hypokinesis which seems chronic and grade 1 diastolic dysfunction. Cardiology started him on colchicine and recommending heparin for 48 hours. Also started him on PPI for concern of GERD responsible for some of his current symptoms.     Assessment and Plan: * Chest pain Less likely ACS, troponin peaked at 203 and then trending down.  Also concern of pericarditis and has pain get worse with laying down and improved with sitting up. He was started on heparin infusion-cardiology recommending for  48 hours for his risk of coronary artery disease. He was also started on colchicine. GERD is also on differential so he was started on PPI. -Continue to monitor -Continue with home aspirin, Plavix and statin.  NSTEMI (non-ST elevated myocardial infarction) (Logan Crawford) Less likely-please see above Most likely demand ischemia-UDS was positive for cocaine  Stage 3a chronic kidney disease (Logan Crawford) Seems stable. -Monitor renal function -Avoid nephrotoxins  Polysubstance abuse (Logan Crawford) UDS positive for cannabinoid and cocaine.  Per patient he stopped using cocaine but was using THC Gummies which might be laced?? Extensive counseling was provided. Sister was at bedside who will trying to take care of this.  Tobacco use Counseling was provided. -Nicotine patch as needed  Leukocytosis Resolved.  Most likely reactive  Hypertension -Continue home Cozaar and metoprolol -IV hydralazine as needed  Hyperlipidemia -Continue home Crestor  Chronic diastolic CHF (congestive heart failure) (HCC) BNP normal and clinically appears euvolemic. -Continue to monitor  Obesity with body mass index (BMI) of 30.0 to 39.9 Estimated body mass index is 30.32 kg/m as calculated from the following:   Height as of this encounter: 6' (1.829 m).   Weight as of this encounter: 101.4 kg.   -Counseling was provided  GERD (gastroesophageal reflux disease) GERD might be the cause of current symptoms. -Started on PPI    Subjective: Patient was complaining of some indigestion and GERD symptoms when seen today.  Physical Exam: Vitals:   05/02/22 0615 05/02/22 1020 05/02/22 1130 05/02/22 1257  BP: 115/78 128/83 101/73 101/74  Pulse: 78 87 82 83  Resp: '14 19 18 18  '$ Temp: (!) 97.5 F (36.4 C) 98.9 F (37.2 C)  97.6 F (36.4 C)  TempSrc: Oral   Oral  SpO2: 96% 96% 97% 97%  Weight:      Height:       General.  Well-developed gentleman, in no acute distress. Pulmonary.  Lungs clear bilaterally, normal  respiratory effort. CV.  Regular rate and rhythm, no JVD, rub or murmur. Abdomen.  Soft, nontender, nondistended, BS positive. CNS.  Alert and oriented .  No focal neurologic deficit. Extremities.  No edema, no cyanosis, pulses intact and symmetrical. Psychiatry.  Judgment and insight appears normal.   Data Reviewed: Prior data reviewed  Family Communication: Discussed with sister at bedside  Disposition: Status is: Inpatient Remains inpatient appropriate because: Severity of illness  Planned Discharge Destination: Home  DVT prophylaxis.  Heparin infusion Time spent: 45 minutes  This record has been created using Systems analyst. Errors have been sought and corrected,but may not always be located. Such creation errors do not reflect on the standard of care.   Author: Lorella Nimrod, MD 05/02/2022 2:24 PM  For on call review [email or web address].

## 2022-05-02 NOTE — Progress Notes (Signed)
ANTICOAGULATION CONSULT NOTE  Pharmacy Consult for heparin Indication: NSTEMI  No Known Allergies  Patient Measurements: Height: 6' (182.9 cm) Weight: 101.4 kg (223 lb 8.7 oz) IBW/kg (Calculated) : 77.6 Heparin Dosing Weight: 98.3 kg  Vital Signs: Temp: 97.5 F (36.4 C) (03/08 0615) Temp Source: Oral (03/08 0615) BP: 115/78 (03/08 0615) Pulse Rate: 78 (03/08 0615)  Labs: Recent Labs    05/01/22 0800 05/01/22 0802 05/01/22 0926 05/01/22 1246 05/01/22 1529 05/01/22 1718 05/01/22 1828 05/02/22 0049 05/02/22 0625  HGB 14.3  --   --   --   --   --   --   --  13.3  HCT 44.2  --   --   --   --   --   --   --  41.5  PLT 459*  --   --   --   --   --   --   --  390  APTT 25  --   --   --   --   --   --   --   --   LABPROT  --  13.0  --   --   --   --   --   --   --   INR  --  1.0  --   --   --   --   --   --   --   HEPARINUNFRC  --   --   --   --   --  0.24*  --  0.31 0.34  CREATININE 1.40*  --   --   --   --   --   --   --   --   TROPONINIHS 163*  --    < > 195* 203*  --  180*  --   --    < > = values in this interval not displayed.     Estimated Creatinine Clearance: 79.5 mL/min (A) (by C-G formula based on SCr of 1.4 mg/dL (H)).   Medical History: Past Medical History:  Diagnosis Date   CAD (coronary artery disease)    a. 11/2019 NSTEMI/PCI: LM min irregs, LAD 56m(2.75x22 Resolute Onyx DES), 40d, D2 min irregs, LCX/LPAV min irregs, RCA 60ost, 40p/m, EF 45-50%; b. 11/2020 MV: EF 43%, small area of apical ischemia ->? over processing-->low risk.   Hyperlipidemia    Hypertension    Ischemic cardiomyopathy    a. 11/2019 LV Gram: EF 45-50%; b. 12/2019 Echo: EF 50% w/ mod mid-apical anteroseptal HK, GrI DD, nl RV fxn, mild Ao sclerosis.   Tobacco use 11/25/2019    Medications:  No evidence of PTA AC meds  Assessment: Pharmacy consulted to dose heparin in this 47yo M who presents to the ED with extensive cardiac history, status post recent two-vessel CABG on dual  antiplatelet therapy.  Troponin elevated.  Baseline Labs: Hgb 14.3, Plt 459, INR 1.0, aPTT ordered    Goal of Therapy:  Heparin level 0.3-0.7 units/ml Monitor platelets by anticoagulation protocol: Yes  3/07 1718 HL 0.24, subtherapeutic @ 1250 u/hr 3/08 0049 HL 0.31, therapeutic x 1 3/08 0625 HL 0.34, therapeutic x 2   Plan:  -Continue heparin infusion rate at 1400 units/hr -Recheck heparin level daily w/ AM labs while therapeutic -Daily CBC while on heparin  NRenda Rolls PharmD, [facility]3/09/2022 6:53 AM

## 2022-05-02 NOTE — Assessment & Plan Note (Addendum)
Less likely-please see above Most likely demand ischemia-UDS was positive for cocaine

## 2022-05-02 NOTE — Progress Notes (Signed)
[place] for heparin Indication: NSTEMI  No Known Allergies  Patient Measurements: Height: 6' (182.9 cm) Weight: 101.4 kg (223 lb 8.7 oz) IBW/kg (Calculated) : 77.6 Heparin Dosing Weight: 98.3 kg  Vital Signs: Temp: 97.4 F (36.3 C) (03/07 1900) Temp Source: Oral (03/07 1900) BP: 108/71 (03/08 0045) Pulse Rate: 85 (03/08 0045)  Labs: Recent Labs    05/01/22 0800 05/01/22 0802 05/01/22 0926 05/01/22 1246 05/01/22 1529 05/01/22 1718 05/01/22 1828 05/02/22 0049  HGB 14.3  --   --   --   --   --   --   --   HCT 44.2  --   --   --   --   --   --   --   PLT 459*  --   --   --   --   --   --   --   APTT 25  --   --   --   --   --   --   --   LABPROT  --  13.0  --   --   --   --   --   --   INR  --  1.0  --   --   --   --   --   --   HEPARINUNFRC  --   --   --   --   --  0.24*  --  0.31  CREATININE 1.40*  --   --   --   --   --   --   --   TROPONINIHS 163*  --    < > 195* 203*  --  180*  --    < > = values in this interval not displayed.     Estimated Creatinine Clearance: 79.5 mL/min (A) (by C-G formula based on SCr of 1.4 mg/dL (H)).   Medical History: Past Medical History:  Diagnosis Date   CAD (coronary artery disease)    a. 11/2019 NSTEMI/PCI: LM min irregs, LAD 5m(2.75x22 Resolute Onyx DES), 40d, D2 min irregs, LCX/LPAV min irregs, RCA 60ost, 40p/m, EF 45-50%; b. 11/2020 MV: EF 43%, small area of apical ischemia ->? over processing-->low risk.   Hyperlipidemia    Hypertension    Ischemic cardiomyopathy    a. 11/2019 LV Gram: EF 45-50%; b. 12/2019 Echo: EF 50% w/ mod mid-apical anteroseptal HK, GrI DD, nl RV fxn, mild Ao sclerosis.   Tobacco use 11/25/2019    Medications:  No evidence of PTA AC meds  Assessment: Pharmacy consulted to dose heparin in this 47yo M who presents to the ED with extensive cardiac history, status post recent two-vessel CABG on dual antiplatelet therapy.  Troponin elevated.  Baseline Labs:  Hgb 14.3, Plt 459, INR 1.0, aPTT ordered    Goal of Therapy:  Heparin level 0.3-0.7 units/ml Monitor platelets by anticoagulation protocol: Yes  3/07 1718 HL 0.24, subtherapeutic @ 1250 u/hr 3/08 0049 HL 0.31, therapeutic x 1   Plan:  -Continue heparin infusion rate at 1400 units/hr -Recheck heparin level 6 hours to confirm -Daily CBC while on heparin  NRenda Rolls PharmD, M[facility]3/09/2022 1:52 AM

## 2022-05-02 NOTE — Assessment & Plan Note (Signed)
-   Continue home Crestor °

## 2022-05-02 NOTE — Progress Notes (Signed)
Rounding Note    Patient Name: Logan Crawford Date of Encounter: 05/02/2022  [facility] Cardiologist: Kate Sable, MD   Subjective   Family at the bedside this morning on rounds He reports chest pain improving, still very low-grade discomfort noted Reports it is better when he is sitting up and moving Concerned about his GERD symptoms, requesting change to his medication Discussed his cocaine positive status Nontrending troponins trending between 160 and 180 Echocardiogram with hypokinesis in the apical range, unchanged from prior study  Inpatient Medications    Scheduled Meds:  aspirin EC  81 mg Oral Daily   clopidogrel  75 mg Oral Daily   colchicine  0.6 mg Oral BID   empagliflozin  10 mg Oral QPM   famotidine  20 mg Oral BID   furosemide  40 mg Oral Daily   gabapentin  200 mg Oral BID   losartan  50 mg Oral Daily   metoprolol succinate  25 mg Oral Daily   nicotine  21 mg Transdermal Daily   pantoprazole  40 mg Oral BID   rosuvastatin  40 mg Oral QPM   spironolactone  25 mg Oral Daily   Continuous Infusions:  heparin 1,400 Units/hr (05/02/22 0053)   PRN Meds: acetaminophen, albuterol, dextromethorphan-guaiFENesin, hydrALAZINE, nitroGLYCERIN, ondansetron (ZOFRAN) IV, oxyCODONE-acetaminophen   Vital Signs    Vitals:   05/02/22 0615 05/02/22 1020 05/02/22 1130 05/02/22 1257  BP: 115/78 128/83 101/73 101/74  Pulse: 78 87 82 83  Resp: '14 19 18 18  '$ Temp: (!) 97.5 F (36.4 C) 98.9 F (37.2 C)  97.6 F (36.4 C)  TempSrc: Oral   Oral  SpO2: 96% 96% 97% 97%  Weight:      Height:        Intake/Output Summary (Last 24 hours) at 05/02/2022 1537 Last data filed at 05/02/2022 [id number] Gross per 24 hour  Intake --  Output 1000 ml  Net -1000 ml      05/01/2022    7:47 AM 04/25/2022    2:42 PM 04/15/2022   10:40 AM  Last 3 Weights  Weight (lbs) 223 lb 8.7 oz 223 lb 9.6 oz 226 lb 3.2 oz  Weight (kg) 101.4 kg 101.424 kg 102.604 kg      Telemetry     Normal sinus rhythm- Personally Reviewed  ECG     - Personally Reviewed  Physical Exam   GEN: No acute distress.   Neck: No JVD Cardiac: RRR, no murmurs, rubs, or gallops.  Respiratory: Clear to auscultation bilaterally. GI: Soft, nontender, non-distended  MS: No edema; No deformity. Neuro:  Nonfocal  Psych: Normal affect   Labs    High Sensitivity Troponin:   Recent Labs  Lab 05/01/22 0800 05/01/22 0926 05/01/22 1246 05/01/22 1529 05/01/22 1828  TROPONINIHS 163* 173* 195* 203* 180*     Chemistry Recent Labs  Lab 05/01/22 0800 05/02/22 0625  NA 135 133*  K 3.8 3.8  CL 109 105  CO2 19* 21*  GLUCOSE 142* 106*  BUN 18 19  CREATININE 1.40* 1.39*  CALCIUM 8.6* 8.8*  PROT 7.8  --   ALBUMIN 3.7  --   AST 26  --   ALT 23  --   ALKPHOS 104  --   BILITOT 0.3  --   GFRNONAA >60 >60  ANIONGAP 7 7    Lipids  Recent Labs  Lab 05/02/22 0625  CHOL 138  TRIG 331*  HDL 26*  LDLCALC 46  CHOLHDL 5.3  Hematology Recent Labs  Lab 05/01/22 0800 05/02/22 0625  WBC 12.8* 9.3  RBC 5.29 4.92  HGB 14.3 13.3  HCT 44.2 41.5  MCV 83.6 84.3  MCH 27.0 27.0  MCHC 32.4 32.0  RDW 15.0 14.9  PLT 459* 390   Thyroid No results for input(s): "TSH", "FREET4" in the last 168 hours.  BNP Recent Labs  Lab 05/01/22 0800  BNP 40.4    DDimer  Recent Labs  Lab 05/02/22 1117  DDIMER 10.35*     Radiology    ECHOCARDIOGRAM COMPLETE  Result Date: 05/01/2022    ECHOCARDIOGRAM REPORT   Patient Name:   Logan Crawford Date of Exam: 05/01/2022 Medical Rec #:  [id number]        Height:       72.0 in Accession #:    [id number]       Weight:       223.5 lb Date of Birth:  1975-07-10       BSA:          2.234 m Patient Age:    47 years         BP:           123/91 mmHg Patient Gender: M                HR:           84 bpm. Exam Location:  [facility] Procedure: 2D Echo, Cardiac Doppler and Color Doppler Indications:     Chest pain R07.9  History:         Patient has prior history of  Echocardiogram examinations, most                  recent 03/19/2022. CAD; Risk Factors:Hypertension. Tobacco use,                  ischemic cardiomyopathy.  Sonographer:     Sherrie Sport Referring Phys:  [id number] SHERI HAMMOCK Diagnosing Phys: Ida Rogue MD  Sonographer Comments: Suboptimal apical window. IMPRESSIONS  1. Left ventricular ejection fraction, by estimation, is 55%. The left ventricle has normal function. The left ventricle demonstrates regional wall motion abnormalities (hypokinesis of the distal anteroseptal and periapical region, seen previously 1/24). There is moderate left ventricular hypertrophy. Left ventricular diastolic parameters are consistent with Grade I diastolic dysfunction (impaired relaxation).  2. Right ventricular systolic function is normal. The right ventricular size is normal.  3. The mitral valve is normal in structure. Mild mitral valve regurgitation. No evidence of mitral stenosis.  4. The aortic valve is normal in structure. There is mild calcification of the aortic valve. Aortic valve regurgitation is not visualized. Aortic valve sclerosis/calcification is present, without any evidence of aortic stenosis.  5. The inferior vena cava is normal in size with greater than 50% respiratory variability, suggesting right atrial pressure of 3 mmHg. FINDINGS  Left Ventricle: Left ventricular ejection fraction, by estimation, is 55%. The left ventricle has normal function. The left ventricle demonstrates regional wall motion abnormalities. The left ventricular internal cavity size was normal in size. There is  moderate left ventricular hypertrophy. Left ventricular diastolic parameters are consistent with Grade I diastolic dysfunction (impaired relaxation). Right Ventricle: The right ventricular size is normal. No increase in right ventricular wall thickness. Right ventricular systolic function is normal. Left Atrium: Left atrial size was normal in size. Right Atrium: Right atrial size  was normal in size. Pericardium: There is no evidence of pericardial effusion. Mitral Valve: The mitral valve  is normal in structure. There is mild calcification of the mitral valve leaflet(s). Mild mitral valve regurgitation. No evidence of mitral valve stenosis. Tricuspid Valve: The tricuspid valve is normal in structure. Tricuspid valve regurgitation is not demonstrated. No evidence of tricuspid stenosis. Aortic Valve: The aortic valve is normal in structure. There is mild calcification of the aortic valve. Aortic valve regurgitation is not visualized. Aortic valve sclerosis/calcification is present, without any evidence of aortic stenosis. Aortic valve mean gradient measures 2.0 mmHg. Aortic valve peak gradient measures 3.5 mmHg. Aortic valve area, by VTI measures 5.11 cm. Pulmonic Valve: The pulmonic valve was normal in structure. Pulmonic valve regurgitation is not visualized. No evidence of pulmonic stenosis. Aorta: The aortic root is normal in size and structure. Venous: The inferior vena cava is normal in size with greater than 50% respiratory variability, suggesting right atrial pressure of 3 mmHg. IAS/Shunts: No atrial level shunt detected by color flow Doppler.  LEFT VENTRICLE PLAX 2D LVIDd:         3.50 cm   Diastology LVIDs:         2.00 cm   LV e' medial:    4.03 cm/s LV PW:         1.30 cm   LV E/e' medial:  15.5 LV IVS:        1.50 cm   LV e' lateral:   7.83 cm/s LVOT diam:     2.30 cm   LV E/e' lateral: 8.0 LV SV:         78 LV SV Index:   35 LVOT Area:     4.15 cm  RIGHT VENTRICLE RV Basal diam:  4.00 cm RV Mid diam:    3.70 cm RV S prime:     8.70 cm/s TAPSE (M-mode): 1.3 cm LEFT ATRIUM             Index        RIGHT ATRIUM           Index LA diam:        3.20 cm 1.43 cm/m   RA Area:     15.30 cm LA Vol (A2C):   26.4 ml 11.82 ml/m  RA Volume:   39.80 ml  17.82 ml/m LA Vol (A4C):   34.4 ml 15.40 ml/m LA Biplane Vol: 31.0 ml 13.88 ml/m  AORTIC VALVE AV Area (Vmax):    4.50 cm AV Area  (Vmean):   4.51 cm AV Area (VTI):     5.11 cm AV Vmax:           93.30 cm/s AV Vmean:          68.400 cm/s AV VTI:            0.153 m AV Peak Grad:      3.5 mmHg AV Mean Grad:      2.0 mmHg LVOT Vmax:         101.00 cm/s LVOT Vmean:        74.300 cm/s LVOT VTI:          0.188 m LVOT/AV VTI ratio: 1.23  AORTA Ao Root diam: 2.90 cm MITRAL VALVE               TRICUSPID VALVE MV Area (PHT): 3.61 cm    TR Peak grad:   11.8 mmHg MV Decel Time: 210 msec    TR Vmax:        172.00 cm/s MV E velocity: 62.40 cm/s MV A velocity: 68.70  cm/s  SHUNTS MV E/A ratio:  0.91        Systemic VTI:  0.19 m                            Systemic Diam: 2.30 cm Ida Rogue MD Electronically signed by Ida Rogue MD Signature Date/Time: 05/01/2022/12:48:05 PM    Final    DG Chest Portable 1 View  Result Date: 05/01/2022 CLINICAL DATA:  Chest pain EXAM: PORTABLE CHEST 1 VIEW COMPARISON:  Chest x-ray dated March 17, 2022 FINDINGS: The heart size and mediastinal contours are within normal limits. Prior median sternotomy. Left atrial occlusion clip. Linear opacity of the lower left lung, likely due to scarring or atelectasis. Both lungs are otherwise clear. The visualized skeletal structures are unremarkable. IMPRESSION: No active disease. Electronically Signed   By: Yetta Glassman M.D.   On: 05/01/2022 08:19    Cardiac Studies   Echo  1. Left ventricular ejection fraction, by estimation, is 55%. The left  ventricle has normal function. The left ventricle demonstrates regional  wall motion abnormalities (hypokinesis of the distal anteroseptal and  periapical region, seen previously  1/24). There is moderate left ventricular hypertrophy. Left ventricular  diastolic parameters are consistent with Grade I diastolic dysfunction  (impaired relaxation).   2. Right ventricular systolic function is normal. The right ventricular  size is normal.   3. The mitral valve is normal in structure. Mild mitral valve  regurgitation. No  evidence of mitral stenosis.   4. The aortic valve is normal in structure. There is mild calcification  of the aortic valve. Aortic valve regurgitation is not visualized. Aortic  valve sclerosis/calcification is present, without any evidence of aortic  stenosis.   5. The inferior vena cava is normal in size with greater than 50%  respiratory variability, suggesting right atrial pressure of 3 mmHg.   Patient Profile     [name] is a 47 year old gentleman with history of coronary artery disease, non-STEMI, DES to LAD November 2021, additional stent to distal edge for restenosis March 2023, medication noncompliance, CABG times 26 January 2022, ischemic cardiomyopathy, hypertension, chronic cocaine use, presenting with stuttering chest pain symptoms   Assessment & Plan    Chest pain/pericarditis Somewhat atypical in nature, hurts when laying supine concerning for pericarditis Somewhat relieved by sitting up Echocardiogram unchanged from prior study Troponin mildly elevated, nontrending,  positive cocaine Plan to complete heparin 48 hours, Continue colchicine 0.6 twice daily -Will try to avoid NSAIDs given underlying renal failure Started on PPI   Coronary artery disease with stable angina, history of CABG Recent CABG surgery December 2023 As above no plan on ischemic workup at this time given reasons above Stressed importance of staying on his Crestor 40 daily, reports some discomfort in his legs concerning for myalgias   Chronic diastolic CHF Reports having mild fluid retention in his abdomen Placed back on his outpatient regiment, on Lasix, spironolactone, Jardiance, metoprolol succinate Dose of IV Lasix x 1 given yesterday  Essential hypertension Continue losartan, metoprolol, spironolactone, Lasix Blood pressure relatively well-controlled   Chronic kidney disease stage III AA Creatinine stable 1.4 Will try to avoid NSAIDs in the setting of possible pericarditis as  above   GERD On PPI twice daily  Polysubstance abuse Admits to marijuana, does not know how cocaine got in his system Spends his time playing pool Cessation from drugs recommended   Long discussion with patient, and significant other at  his bedside concerning details above  Total encounter time more than 50 minutes  Greater than 50% was spent in counseling and coordination of care with the patient     For questions or updates, please contact [place] Please consult [email or web address] for contact info under        Signed, Ida Rogue, MD  05/02/2022, 3:37 PM

## 2022-05-02 NOTE — Assessment & Plan Note (Signed)
Resolved.  Most likely reactive

## 2022-05-02 NOTE — Assessment & Plan Note (Signed)
BNP normal and clinically appears euvolemic. -Continue to monitor

## 2022-05-03 ENCOUNTER — Inpatient Hospital Stay: Payer: Medicaid Other

## 2022-05-03 DIAGNOSIS — N1831 Chronic kidney disease, stage 3a: Secondary | ICD-10-CM

## 2022-05-03 DIAGNOSIS — D72829 Elevated white blood cell count, unspecified: Secondary | ICD-10-CM

## 2022-05-03 DIAGNOSIS — I2489 Other forms of acute ischemic heart disease: Secondary | ICD-10-CM

## 2022-05-03 DIAGNOSIS — K219 Gastro-esophageal reflux disease without esophagitis: Secondary | ICD-10-CM

## 2022-05-03 DIAGNOSIS — F1910 Other psychoactive substance abuse, uncomplicated: Secondary | ICD-10-CM

## 2022-05-03 DIAGNOSIS — I10 Essential (primary) hypertension: Secondary | ICD-10-CM

## 2022-05-03 DIAGNOSIS — R0781 Pleurodynia: Secondary | ICD-10-CM

## 2022-05-03 DIAGNOSIS — R7989 Other specified abnormal findings of blood chemistry: Secondary | ICD-10-CM

## 2022-05-03 DIAGNOSIS — I300 Acute nonspecific idiopathic pericarditis: Secondary | ICD-10-CM

## 2022-05-03 LAB — HEPARIN LEVEL (UNFRACTIONATED): Heparin Unfractionated: 0.34 IU/mL (ref 0.30–0.70)

## 2022-05-03 LAB — CBC
HCT: 42.8 % (ref 39.0–52.0)
Hemoglobin: 14 g/dL (ref 13.0–17.0)
MCH: 26.9 pg (ref 26.0–34.0)
MCHC: 32.7 g/dL (ref 30.0–36.0)
MCV: 82.3 fL (ref 80.0–100.0)
Platelets: 424 10*3/uL — ABNORMAL HIGH (ref 150–400)
RBC: 5.2 MIL/uL (ref 4.22–5.81)
RDW: 14.7 % (ref 11.5–15.5)
WBC: 8.8 10*3/uL (ref 4.0–10.5)
nRBC: 0 % (ref 0.0–0.2)

## 2022-05-03 LAB — GLUCOSE, CAPILLARY: Glucose-Capillary: 131 mg/dL — ABNORMAL HIGH (ref 70–99)

## 2022-05-03 MED ORDER — NICOTINE 21 MG/24HR TD PT24: 21.0000 mg | MEDICATED_PATCH | Freq: Every day | TRANSDERMAL | 0 refills | Status: DC

## 2022-05-03 MED ORDER — COLCHICINE 0.6 MG PO TABS: 0.6000 mg | ORAL_TABLET | Freq: Two times a day (BID) | ORAL | 1 refills | Status: DC

## 2022-05-03 MED ORDER — PANTOPRAZOLE SODIUM 40 MG PO TBEC: 40.0000 mg | DELAYED_RELEASE_TABLET | Freq: Two times a day (BID) | ORAL | 1 refills | Status: DC

## 2022-05-03 MED ORDER — TECHNETIUM TO 99M ALBUMIN AGGREGATED
4.4000 | Freq: Once | INTRAVENOUS | Status: AC | PRN
  Administered 2022-05-03: 4.4 via INTRAVENOUS

## 2022-05-03 MED ORDER — METOPROLOL SUCCINATE ER 25 MG PO TB24: 25.0000 mg | ORAL_TABLET | Freq: Every day | ORAL | 3 refills | Status: DC

## 2022-05-03 MED ORDER — DM-GUAIFENESIN ER 30-600 MG PO TB12: 1.0000 | ORAL_TABLET | Freq: Two times a day (BID) | ORAL | 0 refills | Status: DC | PRN

## 2022-05-03 NOTE — Discharge Summary (Signed)
Physician Discharge Summary   Patient: Logan Crawford MRN: [id number] DOB: [birth year]  Admit date:     05/01/2022  Discharge date: 05/03/22  Discharge Physician: Lorella Nimrod   PCP: Renee Rival, FNP   Recommendations at discharge:  Please obtain CBC and BMP in 1 week Please repeat D-dimer as we found to be elevated.  Patient might need oncologic workup to rule out any underlying malignancy. Patient should not be using beta-blocker if continue to use cocaine-counseling was provided and will need continuation of counseling. Follow-up with primary care provider Follow-up with cardiology  Discharge Diagnoses: Principal Problem:   Chest pain Active Problems:   NSTEMI (non-ST elevated myocardial infarction) ([place])   Stage 3a chronic kidney disease (HCC)   Polysubstance abuse ([place])   Tobacco use   Leukocytosis   Hypertension   Hyperlipidemia   Chronic diastolic CHF (congestive heart failure) (HCC)   Obesity with body mass index (BMI) of 30.0 to 39.9   GERD (gastroesophageal reflux disease)   Demand ischemia   Hospital Course: Taken from H&P.   Logan Crawford is a 47 y.o. 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  Associated with mild shortness of breath.  Pain is worse at night and while lying down.  Data reviewed independently and ED Course: pt was found to have trop  163, WBC 12.8, stable renal function, temperature normal, blood pressure 114/83, heart rate 103, 93, RR 20, 11, oxygen saturation 96% on room air.  Chest x-ray negative.  Dr. Rockey Situ of cardiology is consulted.   EKG:  Sinus rhythm, QTc 445, LAE, Q waves in the inferior leads that V4-V6  Patient was started on heparin infusion for concern of NSTEMI and colchicine for concern of pericarditis.  3/8: Vital stable this morning.   Troponin peaked at 203 and now trending down.  Labs with mild hyponatremia at 133 which is clinically nonsignificant.  Renal functions around baseline.  Mild none anion gap metabolic acidosis with bicarb at 21, improved than yesterday.  Lipid panel with elevated triglyceride at 331, HDL 26 and LDL 46.  UDS positive for cocaine and cannabinoid.  BNP normal.  Echo with normal EF, some apical hypokinesis which seems chronic and grade 1 diastolic dysfunction. Cardiology started him on colchicine and recommending heparin for 48 hours. Also started him on PPI for concern of GERD responsible for some of his current symptoms.  3/9: Patient remained hemodynamically stable.  Chest pain improved.  D-dimer was checked by cardiology and found to be significantly elevated at 10.35.  VQ scan was obtained to rule out PE and it was negative.  Lower extremity venous Doppler was negative for DVT.  Recent echocardiogram was negative for any cardiac thrombus.  Patient was advised to follow-up with his cardiologist and primary care provider and need to have oncologic workup to rule out any underlying malignancy.  Patient never had any colonoscopy.  Patient was given colchicine and Protonix along with his home medications. Patient should not be using metoprolol if continue to use cocaine-have another counseling session and discussion.  Also discussed with sister who is trying to stay over his health associated care.  Patient will continue the rest of his home medications and need to have a close follow-up with his providers for further recommendations.  Assessment and Plan: * Chest pain Less  likely ACS, troponin peaked at 203 and then trending down.  Also concern of pericarditis and has pain get worse with laying down and improved with sitting up. He was started on heparin infusion-cardiology recommending for 48 hours for his risk of coronary artery disease. He was also started on colchicine. GERD is also on differential  so he was started on PPI. -Continue to monitor -Continue with home aspirin, Plavix and statin.  NSTEMI (non-ST elevated myocardial infarction) ([place]) Less likely-please see above Most likely demand ischemia-UDS was positive for cocaine  Stage 3a chronic kidney disease ([place]) Seems stable. -Monitor renal function -Avoid nephrotoxins  Polysubstance abuse ([place]) UDS positive for cannabinoid and cocaine.  Per patient he stopped using cocaine but was using THC Gummies which might be laced?? Extensive counseling was provided. Sister was at bedside who will trying to take care of this.  Tobacco use Counseling was provided. -Nicotine patch as needed  Leukocytosis Resolved.  Most likely reactive  Hypertension -Continue home Cozaar and metoprolol -IV hydralazine as needed  Hyperlipidemia -Continue home Crestor  Chronic diastolic CHF (congestive heart failure) (HCC) BNP normal and clinically appears euvolemic. -Continue to monitor  Obesity with body mass index (BMI) of 30.0 to 39.9 Estimated body mass index is 30.32 kg/m as calculated from the following:   Height as of this encounter: 6' (1.829 m).   Weight as of this encounter: 101.4 kg.   -Counseling was provided  GERD (gastroesophageal reflux disease) GERD might be the cause of current symptoms. -Started on PPI   Consultants: Cardiology Procedures performed: None Disposition: Home Diet recommendation:  Discharge Diet Orders (From admission, onward)     Start     Ordered   05/03/22 0000  Diet - low sodium heart healthy        05/03/22 1451           Cardiac and Carb modified diet DISCHARGE MEDICATION: Allergies as of 05/03/2022   No Known Allergies      Medication List     TAKE these medications    albuterol 108 (90 Base) MCG/ACT inhaler Commonly known as: VENTOLIN HFA Inhale 2 puffs into the lungs every 6 (six) hours as needed for wheezing or shortness of breath.   aspirin EC 81 MG tablet Take 1  tablet (81 mg total) by mouth daily. Swallow whole.   clopidogrel 75 MG tablet Commonly known as: PLAVIX Take 1 tablet (75 mg total) by mouth daily.   colchicine 0.6 MG tablet Take 1 tablet (0.6 mg total) by mouth 2 (two) times daily.   dextromethorphan-guaiFENesin 30-600 MG 12hr tablet Commonly known as: MUCINEX DM Take 1 tablet by mouth 2 (two) times daily as needed for cough.   famotidine 20 MG tablet Commonly known as: PEPCID Take 1 tablet (20 mg total) by mouth 2 (two) times daily.   furosemide 40 MG tablet Commonly known as: LASIX Take 1 tablet (40 mg total) by mouth daily.   gabapentin 100 MG capsule Commonly known as: NEURONTIN Take 2 capsules (200 mg total) by mouth 2 (two) times daily.   Jardiance 10 MG Tabs tablet Generic drug: empagliflozin Take 10 mg by mouth daily.   losartan 50 MG tablet Commonly known as: COZAAR Take 1 tablet (50 mg total) by mouth daily.   metoprolol succinate 25 MG 24 hr tablet Commonly known as: TOPROL-XL Take 1 tablet (25 mg total) by mouth daily. Stop taking metoprolol if you continue to use cocaine What changed: additional instructions   nicotine 21 mg/24hr  patch Commonly known as: NICODERM CQ - dosed in mg/24 hours Place 1 patch (21 mg total) onto the skin daily. Start taking on: May 04, 2022   nitroGLYCERIN 0.4 MG SL tablet Commonly known as: NITROSTAT Place 1 tablet (0.4 mg total) under the tongue every 5 (five) minutes as needed for chest pain (if chest pain not resolved after 3 doses, please call 911).   pantoprazole 40 MG tablet Commonly known as: PROTONIX Take 1 tablet (40 mg total) by mouth 2 (two) times daily.   rosuvastatin 40 MG tablet Commonly known as: CRESTOR Take 1 tablet (40 mg total) by mouth daily.   spironolactone 25 MG tablet Commonly known as: ALDACTONE Take 1 tablet (25 mg total) by mouth daily.        Follow-up Information     Renee Rival, FNP. Schedule an appointment as soon as  possible for a visit in 1 week(s).   Specialty: Nurse Practitioner Contact information: [address] [place] [address] [phone]         Kate Sable, MD. Schedule an appointment as soon as possible for a visit in 1 week(s).   Specialties: Cardiology, Radiology Contact information: [place] [address] [phone]                Discharge Exam: Danley Danker Weights   05/01/22 0747 05/02/22 1700 05/03/22 0500  Weight: 101.4 kg 99.7 kg 99.5 kg   General.     In no acute distress. Pulmonary.  Lungs clear bilaterally, normal respiratory effort. CV.  Regular rate and rhythm, no JVD, rub or murmur. Abdomen.  Soft, nontender, nondistended, BS positive. CNS.  Alert and oriented .  No focal neurologic deficit. Extremities.  No edema, no cyanosis, pulses intact and symmetrical. Psychiatry.  Judgment and insight appears normal.   Condition at discharge: stable  The results of significant diagnostics from this hospitalization (including imaging, microbiology, ancillary and laboratory) are listed below for reference.   Imaging Studies: NM Pulmonary Perfusion  Result Date: 05/03/2022 CLINICAL DATA:  Evaluate for pulmonary embolism. Shortness of breath. EXAM: NUCLEAR MEDICINE PERFUSION LUNG SCAN TECHNIQUE: Perfusion images were obtained in multiple projections after intravenous injection of radiopharmaceutical. Ventilation scans intentionally deferred if perfusion scan and chest x-ray adequate for interpretation during COVID 19 epidemic. RADIOPHARMACEUTICALS:  4.4 mCi Tc-67mMAA IV COMPARISON:  Chest x-ray May 03, 2022 FINDINGS: No segmental perfusion defects. IMPRESSION: No segmental perfusion defects to suggest pulmonary embolus. Electronically Signed   By: DDorise BullionIII M.D.   On: 05/03/2022 12:52   DG Chest 2 View  Result Date: 05/03/2022 CLINICAL DATA:  Shortness of breath. EXAM: CHEST - 2 VIEW COMPARISON:  May 01, 2022  chest x-ray FINDINGS: Mild opacity in the left base has a platelike component suggesting atelectasis. The heart, hila, mediastinum, lungs, and pleura are otherwise normal. IMPRESSION: Mild opacity in the left base has a platelike component suggesting atelectasis. Recommend short-term follow-up imaging to ensure resolution. Electronically Signed   By: DDorise BullionIII M.D.   On: 05/03/2022 12:50   UK[place]Venous Img Lower Bilateral (DVT)  Result Date: 05/03/2022 CLINICAL DATA:  Elevated D-dimer.  Assess for DVT. EXAM: BILATERAL LOWER EXTREMITY VENOUS DOPPLER ULTRASOUND TECHNIQUE: Gray-scale sonography with graded compression, as well as color Doppler and duplex ultrasound were performed to evaluate the lower extremity deep venous systems from the level of the common femoral vein and including the common femoral, femoral, profunda femoral, popliteal and calf veins including  the posterior tibial, peroneal and gastrocnemius veins when visible. The superficial great saphenous vein was also interrogated. Spectral Doppler was utilized to evaluate flow at rest and with distal augmentation maneuvers in the common femoral, femoral and popliteal veins. COMPARISON:  None Available. FINDINGS: RIGHT LOWER EXTREMITY Common Femoral Vein: No evidence of thrombus. Normal compressibility, respiratory phasicity and response to augmentation. Saphenofemoral Junction: No evidence of thrombus. Normal compressibility and flow on color Doppler imaging. Profunda Femoral Vein: No evidence of thrombus. Normal compressibility and flow on color Doppler imaging. Femoral Vein: No evidence of thrombus. Normal compressibility, respiratory phasicity and response to augmentation. Popliteal Vein: No evidence of thrombus. Normal compressibility, respiratory phasicity and response to augmentation. Calf Veins: No evidence of thrombus. Normal compressibility and flow on color Doppler imaging. Superficial Great Saphenous Vein: No evidence of thrombus.  Normal compressibility. Venous Reflux:  None. Other Findings:  None. LEFT LOWER EXTREMITY Common Femoral Vein: No evidence of thrombus. Normal compressibility, respiratory phasicity and response to augmentation. Saphenofemoral Junction: No evidence of thrombus. Normal compressibility and flow on color Doppler imaging. Profunda Femoral Vein: No evidence of thrombus. Normal compressibility and flow on color Doppler imaging. Femoral Vein: No evidence of thrombus. Normal compressibility, respiratory phasicity and response to augmentation. Popliteal Vein: No evidence of thrombus. Normal compressibility, respiratory phasicity and response to augmentation. Calf Veins: No evidence of thrombus. Normal compressibility and flow on color Doppler imaging. Superficial Great Saphenous Vein: No evidence of thrombus. Normal compressibility. Venous Reflux:  None. Other Findings:  None. IMPRESSION: No evidence of deep venous thrombosis in either lower extremity. Electronically Signed   By: Jacqulynn Cadet M.D.   On: 05/03/2022 12:26   ECHOCARDIOGRAM COMPLETE  Result Date: 05/01/2022    ECHOCARDIOGRAM REPORT   Patient Name:   Logan Crawford Date of Exam: 05/01/2022 Medical Rec #:  [id number]        Height:       72.0 in Accession #:    [id number]       Weight:       223.5 lb Date of Birth:  1975/12/26       BSA:          2.234 m Patient Age:    84 years         BP:           123/91 mmHg Patient Gender: M                HR:           84 bpm. Exam Location:  [facility] Procedure: 2D Echo, Cardiac Doppler and Color Doppler Indications:     Chest pain R07.9  History:         Patient has prior history of Echocardiogram examinations, most                  recent 03/19/2022. CAD; Risk Factors:Hypertension. Tobacco use,                  ischemic cardiomyopathy.  Sonographer:     Sherrie Sport Referring Phys:  [id number] SHERI HAMMOCK Diagnosing Phys: Ida Rogue MD  Sonographer Comments: Suboptimal apical window. IMPRESSIONS  1. Left ventricular  ejection fraction, by estimation, is 55%. The left ventricle has normal function. The left ventricle demonstrates regional wall motion abnormalities (hypokinesis of the distal anteroseptal and periapical region, seen previously 1/24). There is moderate left ventricular hypertrophy. Left ventricular diastolic parameters are consistent with Grade I diastolic dysfunction (impaired relaxation).  2. Right ventricular  systolic function is normal. The right ventricular size is normal.  3. The mitral valve is normal in structure. Mild mitral valve regurgitation. No evidence of mitral stenosis.  4. The aortic valve is normal in structure. There is mild calcification of the aortic valve. Aortic valve regurgitation is not visualized. Aortic valve sclerosis/calcification is present, without any evidence of aortic stenosis.  5. The inferior vena cava is normal in size with greater than 50% respiratory variability, suggesting right atrial pressure of 3 mmHg. FINDINGS  Left Ventricle: Left ventricular ejection fraction, by estimation, is 55%. The left ventricle has normal function. The left ventricle demonstrates regional wall motion abnormalities. The left ventricular internal cavity size was normal in size. There is  moderate left ventricular hypertrophy. Left ventricular diastolic parameters are consistent with Grade I diastolic dysfunction (impaired relaxation). Right Ventricle: The right ventricular size is normal. No increase in right ventricular wall thickness. Right ventricular systolic function is normal. Left Atrium: Left atrial size was normal in size. Right Atrium: Right atrial size was normal in size. Pericardium: There is no evidence of pericardial effusion. Mitral Valve: The mitral valve is normal in structure. There is mild calcification of the mitral valve leaflet(s). Mild mitral valve regurgitation. No evidence of mitral valve stenosis. Tricuspid Valve: The tricuspid valve is normal in structure. Tricuspid valve  regurgitation is not demonstrated. No evidence of tricuspid stenosis. Aortic Valve: The aortic valve is normal in structure. There is mild calcification of the aortic valve. Aortic valve regurgitation is not visualized. Aortic valve sclerosis/calcification is present, without any evidence of aortic stenosis. Aortic valve mean gradient measures 2.0 mmHg. Aortic valve peak gradient measures 3.5 mmHg. Aortic valve area, by VTI measures 5.11 cm. Pulmonic Valve: The pulmonic valve was normal in structure. Pulmonic valve regurgitation is not visualized. No evidence of pulmonic stenosis. Aorta: The aortic root is normal in size and structure. Venous: The inferior vena cava is normal in size with greater than 50% respiratory variability, suggesting right atrial pressure of 3 mmHg. IAS/Shunts: No atrial level shunt detected by color flow Doppler.  LEFT VENTRICLE PLAX 2D LVIDd:         3.50 cm   Diastology LVIDs:         2.00 cm   LV e' medial:    4.03 cm/s LV PW:         1.30 cm   LV E/e' medial:  15.5 LV IVS:        1.50 cm   LV e' lateral:   7.83 cm/s LVOT diam:     2.30 cm   LV E/e' lateral: 8.0 LV SV:         78 LV SV Index:   35 LVOT Area:     4.15 cm  RIGHT VENTRICLE RV Basal diam:  4.00 cm RV Mid diam:    3.70 cm RV S prime:     8.70 cm/s TAPSE (M-mode): 1.3 cm LEFT ATRIUM             Index        RIGHT ATRIUM           Index LA diam:        3.20 cm 1.43 cm/m   RA Area:     15.30 cm LA Vol (A2C):   26.4 ml 11.82 ml/m  RA Volume:   39.80 ml  17.82 ml/m LA Vol (A4C):   34.4 ml 15.40 ml/m LA Biplane Vol: 31.0 ml 13.88 ml/m  AORTIC VALVE AV  Area (Vmax):    4.50 cm AV Area (Vmean):   4.51 cm AV Area (VTI):     5.11 cm AV Vmax:           93.30 cm/s AV Vmean:          68.400 cm/s AV VTI:            0.153 m AV Peak Grad:      3.5 mmHg AV Mean Grad:      2.0 mmHg LVOT Vmax:         101.00 cm/s LVOT Vmean:        74.300 cm/s LVOT VTI:          0.188 m LVOT/AV VTI ratio: 1.23  AORTA Ao Root diam: 2.90 cm MITRAL VALVE                TRICUSPID VALVE MV Area (PHT): 3.61 cm    TR Peak grad:   11.8 mmHg MV Decel Time: 210 msec    TR Vmax:        172.00 cm/s MV E velocity: 62.40 cm/s MV A velocity: 68.70 cm/s  SHUNTS MV E/A ratio:  0.91        Systemic VTI:  0.19 m                            Systemic Diam: 2.30 cm Ida Rogue MD Electronically signed by Ida Rogue MD Signature Date/Time: 05/01/2022/12:48:05 PM    Final    DG Chest Portable 1 View  Result Date: 05/01/2022 CLINICAL DATA:  Chest pain EXAM: PORTABLE CHEST 1 VIEW COMPARISON:  Chest x-ray dated March 17, 2022 FINDINGS: The heart size and mediastinal contours are within normal limits. Prior median sternotomy. Left atrial occlusion clip. Linear opacity of the lower left lung, likely due to scarring or atelectasis. Both lungs are otherwise clear. The visualized skeletal structures are unremarkable. IMPRESSION: No active disease. Electronically Signed   By: Yetta Glassman M.D.   On: 05/01/2022 08:19    Microbiology: Results for orders placed or performed during the hospital encounter of 05/01/22  MRSA Next Gen by PCR, Nasal     Status: None   Collection Time: 05/01/22 11:24 AM   Specimen: Nasal Mucosa; Nasal Swab  Result Value Ref Range Status   MRSA by PCR Next Gen NOT DETECTED NOT DETECTED Final    Comment: (NOTE) The GeneXpert MRSA Assay (FDA approved for NASAL specimens only), is one component of a comprehensive MRSA colonization surveillance program. It is not intended to diagnose MRSA infection nor to guide or monitor treatment for MRSA infections. Test performance is not FDA approved in patients less than 30 years old. Performed at [facility], [address] [place], [place] [address]     Labs: CBC: Recent Labs  Lab 05/01/22 0800 05/02/22 0625 05/03/22 0455  WBC 12.8* 9.3 8.8  NEUTROABS 8.2*  --   --   HGB 14.3 13.3 14.0  HCT 44.2 41.5 42.8  MCV 83.6 84.3 82.3  PLT 459* 390 [id number]*   Basic Metabolic Panel: Recent  Labs  Lab 05/01/22 0800 05/02/22 0625  NA 135 133*  K 3.8 3.8  CL 109 105  CO2 19* 21*  GLUCOSE 142* 106*  BUN 18 19  CREATININE 1.40* 1.39*  CALCIUM 8.6* 8.8*   Liver Function Tests: Recent Labs  Lab 05/01/22 0800  AST 26  ALT 23  ALKPHOS 104  BILITOT 0.3  PROT 7.8  ALBUMIN 3.7   CBG: Recent Labs  Lab 05/03/22 0801  GLUCAP 131*    Discharge time spent: greater than 30 minutes.  This record has been created using Systems analyst. Errors have been sought and corrected,but may not always be located. Such creation errors do not reflect on the standard of care.   Signed: Lorella Nimrod, MD [facility] 05/03/2022

## 2022-05-03 NOTE — Progress Notes (Signed)
Patient Name: Logan Crawford Patient Profile     [name] is a 47 year old gentleman with history of coronary artery disease, non-STEMI, DES to LAD November 2021, additional stent to distal edge for restenosis March 2023, medication noncompliance, CABG times 26 January 2022, ischemic cardiomyopathy, hypertension, chronic cocaine use, presenting with stuttering chest pain symptoms with a positive UDS for cocaine and mildly elevated but flat troponin Clinically thought to be pericarditis, on colchicine Found to have a positive D-dimer   Subjective        SUBJECTIVE:some ongoing shortness of breath No chest pain   Past Medical History:  Diagnosis Date   CAD (coronary artery disease)    a. 11/2019 NSTEMI/PCI: LM min irregs, LAD 52m(2.75x22 Resolute Onyx DES), 40d, D2 min irregs, LCX/LPAV min irregs, RCA 60ost, 40p/m, EF 45-50%; b. 11/2020 MV: EF 43%, small area of apical ischemia ->? over processing-->low risk.   Hyperlipidemia    Hypertension    Ischemic cardiomyopathy    a. 11/2019 LV Gram: EF 45-50%; b. 12/2019 Echo: EF 50% w/ mod mid-apical anteroseptal HK, GrI DD, nl RV fxn, mild Ao sclerosis.   Tobacco use 11/25/2019    Scheduled Meds:  Scheduled Meds:  aspirin EC  81 mg Oral Daily   clopidogrel  75 mg Oral Daily   colchicine  0.6 mg Oral BID   empagliflozin  10 mg Oral QPM   famotidine  20 mg Oral BID   furosemide  40 mg Oral Daily   gabapentin  200 mg Oral BID   losartan  50 mg Oral Daily   melatonin  5 mg Oral QHS   metoprolol succinate  25 mg Oral Daily   nicotine  21 mg Transdermal Daily   pantoprazole  40 mg Oral BID   polyethylene glycol  17 g Oral Daily   rosuvastatin  40 mg Oral QPM   spironolactone  25 mg Oral Daily   Continuous Infusions:  heparin 1,400 Units/hr (05/03/22 0432)   acetaminophen, albuterol, dextromethorphan-guaiFENesin, hydrALAZINE, nitroGLYCERIN, ondansetron (ZOFRAN) IV, oxyCODONE-acetaminophen    PHYSICAL  EXAM Vitals:   05/03/22 0114 05/03/22 0500 05/03/22 0525 05/03/22 0800  BP: 103/72  91/60 117/70  Pulse: 72  75 82  Resp: 15  19   Temp: 97.7 F (36.5 C)  98 F (36.7 C) 98.6 F (37 C)  TempSrc: Oral  Oral   SpO2: 98%  98% 97%  Weight:  99.5 kg    Height:      Well developed and nourished in no acute distress HENT normal Neck supple with JVP-  flat   Clear Regular rate and rhythm, no murmurs or gallops Abd-soft with active BS No Clubbing cyanosis edema Skin-warm and dry A & Oriented  Grossly normal sensory and motor function     TELEMETRY: Reviewed personnally pt in normal sin:      Intake/Output Summary (Last 24 hours) at 05/03/2022 1104 Last data filed at 05/03/2022 0600 Gross per 24 hour  Intake 1158.2 ml  Output 2600 ml  Net -1441.8 ml    LABS: Basic Metabolic Panel: Recent Labs  Lab 05/01/22 0800 05/02/22 0625  NA 135 133*  K 3.8 3.8  CL 109 105  CO2 19* 21*  GLUCOSE 142* 106*  BUN 18 19  CREATININE 1.40* 1.39*  CALCIUM 8.6* 8.8*   Cardiac Enzymes: 6 No results for input(s): "CKTOTAL", "CKMB", "CKMBINDEX", "TROPONINI" in the last 72 hours. CBC: Recent Labs  Lab 05/01/22 0800 05/02/22 [id number]  05/03/22 0455  WBC 12.8* 9.3 8.8  NEUTROABS 8.2*  --   --   HGB 14.3 13.3 14.0  HCT 44.2 41.5 42.8  MCV 83.6 84.3 82.3  PLT 459* 390 424*   PROTIME: Recent Labs    05/01/22 0802  LABPROT 13.0  INR 1.0   Liver Function Tests: Recent Labs    05/01/22 0800  AST 26  ALT 23  ALKPHOS 104  BILITOT 0.3  PROT 7.8  ALBUMIN 3.7   No results for input(s): "LIPASE", "AMYLASE" in the last 72 hours. BNP: BNP (last 3 results) Recent Labs    03/07/22 1335 04/14/22 1250 05/01/22 0800  BNP 103.5* 48.9 40.4    ProBNP (last 3 results) No results for input(s): "PROBNP" in the last 8760 hours.  D-Dimer: Recent Labs    05/02/22 1117  DDIMER 10.35*   Hemoglobin A1C: No results for input(s): "HGBA1C" in the last 72 hours. Fasting Lipid Panel: Recent  Labs    05/02/22 0625  CHOL 138  HDL 26*  LDLCALC 46  TRIG 331*  CHOLHDL 5.3      ASSESSMENT AND PLAN: Chest pain-?  Pericarditis  Cocaine positive  Elevated troponin  Coronary artery disease with prior bypass  Positive D-dimer  Renal insufficiency grade 2 ( Estimated Creatinine Clearance: 79.4 mL/min (A) (by C-G formula based on SCr of 1.39 mg/dL (H)).   Would discontinue d metoprolo;, with normal left ventricular function and chronic coronary artery disease, would discontinue and not resume  Continue colchicine for pericarditis   With his D-dimer elevation, creatinine clearance calculates at 75 will consider CTA but VQ scan if not anticoagulation will be determined on the basis of this.  No reason at this point to think that the troponin is acute coronary syndrome  Will sign off call for qusiton   Signed, Virl Axe MD  05/03/2022

## 2022-05-03 NOTE — Progress Notes (Signed)
[facility] for heparin Indication: NSTEMI  No Known Allergies  Patient Measurements: Height: 6' (182.9 cm) Weight: 99.7 kg (219 lb 14.4 oz) IBW/kg (Calculated) : 77.6 Heparin Dosing Weight: 98.3 kg  Vital Signs: Temp: 98 F (36.7 C) (03/09 0525) Temp Source: Oral (03/09 0525) BP: 91/60 (03/09 0525) Pulse Rate: 75 (03/09 0525)  Labs: Recent Labs    05/01/22 0800 05/01/22 0802 05/01/22 0926 05/01/22 1246 05/01/22 1529 05/01/22 1718 05/01/22 1828 05/02/22 0049 05/02/22 0625 05/03/22 0455  HGB 14.3  --   --   --   --   --   --   --  13.3 14.0  HCT 44.2  --   --   --   --   --   --   --  41.5 42.8  PLT 459*  --   --   --   --   --   --   --  390 424*  APTT 25  --   --   --   --   --   --   --   --   --   LABPROT  --  13.0  --   --   --   --   --   --   --   --   INR  --  1.0  --   --   --   --   --   --   --   --   HEPARINUNFRC  --   --   --   --   --    < >  --  0.31 0.34 0.34  CREATININE 1.40*  --   --   --   --   --   --   --  1.39*  --   TROPONINIHS 163*  --    < > 195* 203*  --  180*  --   --   --    < > = values in this interval not displayed.     Estimated Creatinine Clearance: 79.4 mL/min (A) (by C-G formula based on SCr of 1.39 mg/dL (H)).   Medical History: Past Medical History:  Diagnosis Date   CAD (coronary artery disease)    a. 11/2019 NSTEMI/PCI: LM min irregs, LAD 63m(2.75x22 Resolute Onyx DES), 40d, D2 min irregs, LCX/LPAV min irregs, RCA 60ost, 40p/m, EF 45-50%; b. 11/2020 MV: EF 43%, small area of apical ischemia ->? over processing-->low risk.   Hyperlipidemia    Hypertension    Ischemic cardiomyopathy    a. 11/2019 LV Gram: EF 45-50%; b. 12/2019 Echo: EF 50% w/ mod mid-apical anteroseptal HK, GrI DD, nl RV fxn, mild Ao sclerosis.   Tobacco use 11/25/2019    Medications:  No evidence of PTA AC meds  Assessment: Pharmacy consulted to dose heparin in this 47yo M who presents to the ED with extensive  cardiac history, status post recent two-vessel CABG on dual antiplatelet therapy.  Troponin elevated.  Baseline Labs: Hgb 14.3, Plt 459, INR 1.0, aPTT ordered    Goal of Therapy:  Heparin level 0.3-0.7 units/ml Monitor platelets by anticoagulation protocol: Yes  3/07 1718 HL 0.24, subtherapeutic @ 1250 u/hr 3/08 0049 HL 0.31, therapeutic x 1 3/08 0625 HL 0.34, therapeutic x 2 3/09 0455 HL 0.34, therapeutic x 3   Plan:  -Continue heparin infusion rate at 1400 units/hr -Recheck heparin level daily w/ AM labs while therapeutic -Daily CBC while on heparin  NRenda Rolls  PharmD, [facility] 05/03/2022 5:36 AM

## 2022-05-05 ENCOUNTER — Other Ambulatory Visit: Payer: Self-pay | Admitting: Physician Assistant

## 2022-05-06 ENCOUNTER — Ambulatory Visit (INDEPENDENT_AMBULATORY_CARE_PROVIDER_SITE_OTHER): Payer: Medicaid Other | Admitting: Student in an Organized Health Care Education/Training Program

## 2022-05-06 ENCOUNTER — Other Ambulatory Visit: Payer: Self-pay | Admitting: Physician Assistant

## 2022-05-06 ENCOUNTER — Encounter: Payer: Self-pay | Admitting: Student in an Organized Health Care Education/Training Program

## 2022-05-06 ENCOUNTER — Telehealth: Payer: Self-pay | Admitting: Student in an Organized Health Care Education/Training Program

## 2022-05-06 VITALS — BP 128/70 | HR 84 | Temp 97.9°F | Ht 72.0 in | Wt 222.0 lb

## 2022-05-06 DIAGNOSIS — R0683 Snoring: Secondary | ICD-10-CM

## 2022-05-06 DIAGNOSIS — J438 Other emphysema: Secondary | ICD-10-CM | POA: Diagnosis not present

## 2022-05-06 DIAGNOSIS — R0602 Shortness of breath: Secondary | ICD-10-CM

## 2022-05-06 LAB — NITRIC OXIDE: Nitric Oxide: 18

## 2022-05-06 MED ORDER — BUDESONIDE-FORMOTEROL FUMARATE 80-4.5 MCG/ACT IN AERO: 2.0000 | INHALATION_SPRAY | RESPIRATORY_TRACT | 0 refills | Status: DC | PRN

## 2022-05-06 NOTE — Progress Notes (Unsigned)
Synopsis: Referred in for shortness of breath by [name], PA-C  Assessment & Plan:   1. Shortness of breath  He presents for the evaluation of exertional dyspnea in the setting of significant cardiac history as well as history of smoking.  His chest CT from December 2023 is notable for paraseptal and centrilobular emphysema consistent with his history of smoking and cocaine use.  I have counseled the patient on the importance of complete abstinence of smoking as well as cocaine use.  Given he reports a history of childhood asthma as well as his history of smoking, I will obtain pulmonary function testing for the evaluation of obstructive lung disease.  Fractional excretion of nitric oxide level today was indeterminate at 18 ppb.  I will prescribe him Symbicort to be used as needed for empiric treatment of possible asthma pending the pulmonary function testing.  - Pulmonary Function Test [facility] Only; Future - budesonide-formoterol (SYMBICORT) 80-4.5 MCG/ACT inhaler; Inhale 2 puffs into the lungs every 4 (four) hours as needed.  Dispense: 3 each; Refill: 0 - Nitric oxide  2. Loud snoring  Patient has a history of snoring and given his heart failure is at increased risk for OSA as well as central sleep apnea.  Will obtain a split-night study to further investigate this.  - Split night study; Future   Return in about 4 weeks (around 06/03/2022).  I spent 60 minutes caring for this patient today, including preparing to see the patient, obtaining a medical history , reviewing a separately obtained history, performing a medically appropriate examination and/or evaluation, counseling and educating the patient/family/caregiver, ordering medications, tests, or procedures, documenting clinical information in the electronic health record, and independently interpreting results (not separately reported/billed) and communicating results to the patient/family/caregiver  [name], MD [place]  [facility] 05/06/2022 3:33 PM    End of visit medications:  Meds ordered this encounter  Medications   budesonide-formoterol (SYMBICORT) 80-4.5 MCG/ACT inhaler    Sig: Inhale 2 puffs into the lungs every 4 (four) hours as needed.    Dispense:  3 each    Refill:  0     Current Outpatient Medications:    aspirin EC 81 MG tablet, Take 1 tablet (81 mg total) by mouth daily. Swallow whole., Disp: 90 tablet, Rfl: 3   budesonide-formoterol (SYMBICORT) 80-4.5 MCG/ACT inhaler, Inhale 2 puffs into the lungs every 4 (four) hours as needed., Disp: 3 each, Rfl: 0   clopidogrel (PLAVIX) 75 MG tablet, Take 1 tablet (75 mg total) by mouth daily., Disp: 90 tablet, Rfl: 3   colchicine 0.6 MG tablet, Take 1 tablet (0.6 mg total) by mouth 2 (two) times daily., Disp: 60 tablet, Rfl: 1   dextromethorphan-guaiFENesin (MUCINEX DM) 30-600 MG 12hr tablet, Take 1 tablet by mouth 2 (two) times daily as needed for cough., Disp: 30 tablet, Rfl: 0   empagliflozin (JARDIANCE) 10 MG TABS tablet, Take 10 mg by mouth daily., Disp: , Rfl:    famotidine (PEPCID) 20 MG tablet, Take 1 tablet (20 mg total) by mouth 2 (two) times daily., Disp: 60 tablet, Rfl: 3   furosemide (LASIX) 40 MG tablet, Take 1 tablet (40 mg total) by mouth daily., Disp: 90 tablet, Rfl: 3   gabapentin (NEURONTIN) 100 MG capsule, Take 2 capsules (200 mg total) by mouth 2 (two) times daily., Disp: 30 capsule, Rfl: 1   losartan (COZAAR) 50 MG tablet, Take 1 tablet (50 mg total) by mouth daily., Disp: 90 tablet, Rfl: 3  metoprolol succinate (TOPROL-XL) 25 MG 24 hr tablet, Take 1 tablet (25 mg total) by mouth daily. Stop taking metoprolol if you continue to use cocaine, Disp: 90 tablet, Rfl: 3   nitroGLYCERIN (NITROSTAT) 0.4 MG SL tablet, Place 1 tablet (0.4 mg total) under the tongue every 5 (five) minutes as needed for chest pain (if chest pain not resolved after 3 doses, please call 911)., Disp: 25 tablet, Rfl: 2   pantoprazole (PROTONIX) 40  MG tablet, Take 1 tablet (40 mg total) by mouth 2 (two) times daily., Disp: 60 tablet, Rfl: 1   rosuvastatin (CRESTOR) 40 MG tablet, Take 1 tablet (40 mg total) by mouth daily., Disp: 90 tablet, Rfl: 3   spironolactone (ALDACTONE) 25 MG tablet, Take 1 tablet (25 mg total) by mouth daily., Disp: 90 tablet, Rfl: 3   albuterol (VENTOLIN HFA) 108 (90 Base) MCG/ACT inhaler, Inhale 2 puffs into the lungs every 6 (six) hours as needed for wheezing or shortness of breath. (Patient not taking: Reported on 05/06/2022), Disp: 6.7 g, Rfl: 0   nicotine (NICODERM CQ - DOSED IN MG/24 HOURS) 21 mg/24hr patch, Place 1 patch (21 mg total) onto the skin daily. (Patient not taking: Reported on 05/06/2022), Disp: 28 patch, Rfl: 0   Subjective:   PATIENT ID: Logan Crawford GENDER: male DOB: 1975/06/07, MRN: [id number]  Chief Complaint  Patient presents with   New Patient (Initial Visit)    SOB with exertion and occ wheezing.     HPI  The patient is a 47 year old male presenting to clinic for the evaluation of shortness of breath.  Patient has an extensive cardiac history and started experiencing exertional dyspnea over the past 2 years alongside his cardiac symptoms.  He has been followed closely by cardiology for his CAD as well as advanced heart failure.  Patient initially underwent PCI in November of 2021 which was around the time his symptoms started.  He developed chest pain again in September of 2023 with cardiac cath showing two-vessel disease treated medically.  Readmission in December 2023 again with anginal symptoms at which point he underwent CABG (LIMA to diagonal, SVG to LAD, SVG to RCA).  Following this, he has been seen by our advanced heart failure specialist as well as his general cardiology team.  He is maintained on goal-directed medical therapy as well as DAPT (aspirin and clopidogrel).  Patient's current symptoms include exertional dyspnea as well as a dry cough that is occasional.  He sometimes  uses his albuterol inhaler which is sometimes helpful. He also sometimes experiences a wheeze. The albuterol sometimes does not resolve his symptoms.  He reports symptoms consistent with paroxysmal nocturnal dyspnea as well as orthopnea.  He does not have any lower extremity edema.  He does not currently have any chest pain but was seen in the ED last week for chest pain, treated as unstable angina.  He does not have any fevers, chills, night sweats, weight loss, GI, or GU symptoms.  Patient also reports snoring and was previously seen in our clinic in 2022 for sleep medicine evaluation but did not end up getting a sleep study.  Patient reports a history of childhood asthma that he outgrew (reports having used over-the-counter inhalers when he was a teenager).  Patient has a history of tobacco use, reports quitting following his recent admission.  He previously snorted cocaine and smoked marijuana but does not do that at the moment.  Edible MJ use reported.  Ancillary information including prior medications, full medical/surgical/family/social  histories, and PFTs (when available) are listed below and have been reviewed.   Review of Systems  Constitutional:  Negative for chills, fever and weight loss.  Respiratory:  Positive for cough and shortness of breath. Negative for hemoptysis and sputum production.   Skin:  Negative for rash.     Objective:   Vitals:   05/06/22 1510 05/06/22 1512  BP: 128/70 128/70  Pulse: 84   Temp: 97.9 F (36.6 C) 97.9 F (36.6 C)  TempSrc: Temporal Temporal  SpO2: 97% 97%  Weight: 222 lb (100.7 kg) 222 lb (100.7 kg)  Height: 6' (1.829 m) 6' (1.829 m)   97% on RA  BMI Readings from Last 3 Encounters:  05/06/22 30.11 kg/m  05/03/22 29.75 kg/m  04/25/22 30.33 kg/m   Wt Readings from Last 3 Encounters:  05/06/22 222 lb (100.7 kg)  05/03/22 219 lb 5.7 oz (99.5 kg)  04/25/22 223 lb 9.6 oz (101.4 kg)    Physical Exam Constitutional:      Appearance:  Normal appearance. He is not ill-appearing.  HENT:     Mouth/Throat:     Mouth: Mucous membranes are moist.  Cardiovascular:     Rate and Rhythm: Normal rate and regular rhythm.     Pulses: Normal pulses.     Heart sounds: Normal heart sounds.  Pulmonary:     Effort: Pulmonary effort is normal.     Breath sounds: Normal breath sounds.  Abdominal:     Palpations: Abdomen is soft.  Neurological:     General: No focal deficit present.     Mental Status: He is alert and oriented to person, place, and time. Mental status is at baseline.     Ancillary Information    Past Medical History:  Diagnosis Date   CAD (coronary artery disease)    a. 11/2019 NSTEMI/PCI: LM min irregs, LAD 62m(2.75x22 Resolute Onyx DES), 40d, D2 min irregs, LCX/LPAV min irregs, RCA 60ost, 40p/m, EF 45-50%; b. 11/2020 MV: EF 43%, small area of apical ischemia ->? over processing-->low risk.   Hyperlipidemia    Hypertension    Ischemic cardiomyopathy    a. 11/2019 LV Gram: EF 45-50%; b. 12/2019 Echo: EF 50% w/ mod mid-apical anteroseptal HK, GrI DD, nl RV fxn, mild Ao sclerosis.   Tobacco use 11/25/2019     Family History  Problem Relation Age of Onset   Coronary artery disease Mother    Arrhythmia Mother        Pacemaker placed in 2021   Heart failure Mother    Coronary artery disease Father      Past Surgical History:  Procedure Laterality Date   CORONARY ARTERY BYPASS GRAFT N/A 02/19/2022   Procedure: CORONARY ARTERY BYPASS GRAFTING (CABG) X THREE, USING ENDOSCOPICALLY HARVESTED RIGHT GREATER SAPHENOUS VEIN  AND LEFT ATRIAL APPENDAGE CLIPPING;  Surgeon: ENeomia Glass MD;  [facility]  Service: Open Heart Surgery;  Laterality: N/A;   CORONARY STENT INTERVENTION N/A 01/24/2020   Procedure: CORONARY STENT INTERVENTION;  Surgeon: AWellington Hampshire MD;  [facility];  Service: Cardiovascular;  Laterality: N/A;   CORONARY STENT INTERVENTION N/A 05/20/2021   Procedure: CORONARY  STENT INTERVENTION;  Surgeon: AWellington Hampshire MD;  [facility];  Service: Cardiovascular;  Laterality: N/A;   INTRAVASCULAR ULTRASOUND/IVUS N/A 05/20/2021   Procedure: Intravascular Ultrasound/IVUS;  Surgeon: AWellington Hampshire MD;  [facility];  Service: Cardiovascular;  Laterality: N/A;   IRRIGATION AND DEBRIDEMENT KNEE  Left    LEFT HEART CATH AND CORONARY ANGIOGRAPHY N/A 01/24/2020   Procedure: LEFT HEART CATH AND CORONARY ANGIOGRAPHY;  Surgeon: Wellington Hampshire, MD;  Location: [facility];  Service: Cardiovascular;  Laterality: N/A;   LEFT HEART CATH AND CORONARY ANGIOGRAPHY N/A 11/18/2021   Procedure: LEFT HEART CATH AND CORONARY ANGIOGRAPHY;  Surgeon: Wellington Hampshire, MD;  Location: [place];  Service: Cardiovascular;  Laterality: N/A;   LEFT HEART CATH AND CORONARY ANGIOGRAPHY N/A 02/13/2022   Procedure: LEFT HEART CATH AND CORONARY ANGIOGRAPHY;  Surgeon: Leonie Man, MD;  Location: [facility];  Service: Cardiovascular;  Laterality: N/A;   LEFT HEART CATH AND CORS/GRAFTS ANGIOGRAPHY N/A 05/20/2021   Procedure: LEFT HEART CATH AND CORS/GRAFTS ANGIOGRAPHY;  Surgeon: Wellington Hampshire, MD;  Location: [facility];  Service: Cardiovascular;  Laterality: N/A;   TYMPANOSTOMY TUBE PLACEMENT Bilateral     Social History   Socioeconomic History   Marital status: Single    Spouse name: Not on file   Number of children: 3   Years of education: Not on file   Highest education level: Not on file  Occupational History   Not on file  Tobacco Use   Smoking status: Some Days    Packs/day: 0.25    Years: 10.00    Total pack years: 2.50    Types: Cigarettes    Passive exposure: Past   Smokeless tobacco: Never   Tobacco comments:    Smokes 1-2 cigarettes every 2-3 days-05/06/2022  Vaping Use   Vaping Use: Never used  Substance and Sexual Activity   Alcohol use: Yes    Alcohol/week: 1.0 standard drink of  alcohol    Types: 1 Shots of liquor per week    Comment: socially   Drug use: Yes    Types: Codeine   Sexual activity: Yes  Other Topics Concern   Not on file  Social History Narrative   Lives with his uncle    Social Determinants of Health   Financial Resource Strain: Not on file  Food Insecurity: No Food Insecurity (05/01/2022)   Hunger Vital Sign    Worried About Running Out of Food in the Last Year: Never true    Ran Out of Food in the Last Year: Never true  Transportation Needs: No Transportation Needs (05/01/2022)   PRAPARE - Hydrologist (Medical): No    Lack of Transportation (Non-Medical): No  Physical Activity: Not on file  Stress: Not on file  Social Connections: Not on file  Intimate Partner Violence: Not At Risk (05/01/2022)   Humiliation, Afraid, Rape, and Kick questionnaire    Fear of Current or Ex-Partner: No    Emotionally Abused: No    Physically Abused: No    Sexually Abused: No     No Known Allergies   CBC    Component Value Date/Time   WBC 8.8 05/03/2022 0455   RBC 5.20 05/03/2022 0455   HGB 14.0 05/03/2022 0455   HGB 16.3 10/03/2013 1306   HCT 42.8 05/03/2022 0455   HCT 48.3 10/03/2013 1306   PLT 424 (H) 05/03/2022 0455   PLT 239 10/03/2013 1306   MCV 82.3 05/03/2022 0455   MCV 95 10/03/2013 1306   MCH 26.9 05/03/2022 0455   MCHC 32.7 05/03/2022 0455   RDW 14.7 05/03/2022 0455   RDW 12.5 10/03/2013 1306   LYMPHSABS 2.4 05/01/2022 0800   LYMPHSABS 2.0 10/03/2013 1306  MONOABS 1.0 05/01/2022 0800   MONOABS 0.9 10/03/2013 1306   EOSABS 1.1 (H) 05/01/2022 0800   EOSABS 0.5 10/03/2013 1306   BASOSABS 0.1 05/01/2022 0800   BASOSABS 0.1 10/03/2013 1306    Pulmonary Functions Testing Results:     No data to display          Outpatient Medications Prior to Visit  Medication Sig Dispense Refill   aspirin EC 81 MG tablet Take 1 tablet (81 mg total) by mouth daily. Swallow whole. 90 tablet 3   clopidogrel  (PLAVIX) 75 MG tablet Take 1 tablet (75 mg total) by mouth daily. 90 tablet 3   colchicine 0.6 MG tablet Take 1 tablet (0.6 mg total) by mouth 2 (two) times daily. 60 tablet 1   dextromethorphan-guaiFENesin (MUCINEX DM) 30-600 MG 12hr tablet Take 1 tablet by mouth 2 (two) times daily as needed for cough. 30 tablet 0   empagliflozin (JARDIANCE) 10 MG TABS tablet Take 10 mg by mouth daily.     famotidine (PEPCID) 20 MG tablet Take 1 tablet (20 mg total) by mouth 2 (two) times daily. 60 tablet 3   furosemide (LASIX) 40 MG tablet Take 1 tablet (40 mg total) by mouth daily. 90 tablet 3   gabapentin (NEURONTIN) 100 MG capsule Take 2 capsules (200 mg total) by mouth 2 (two) times daily. 30 capsule 1   losartan (COZAAR) 50 MG tablet Take 1 tablet (50 mg total) by mouth daily. 90 tablet 3   metoprolol succinate (TOPROL-XL) 25 MG 24 hr tablet Take 1 tablet (25 mg total) by mouth daily. Stop taking metoprolol if you continue to use cocaine 90 tablet 3   nitroGLYCERIN (NITROSTAT) 0.4 MG SL tablet Place 1 tablet (0.4 mg total) under the tongue every 5 (five) minutes as needed for chest pain (if chest pain not resolved after 3 doses, please call 911). 25 tablet 2   pantoprazole (PROTONIX) 40 MG tablet Take 1 tablet (40 mg total) by mouth 2 (two) times daily. 60 tablet 1   rosuvastatin (CRESTOR) 40 MG tablet Take 1 tablet (40 mg total) by mouth daily. 90 tablet 3   spironolactone (ALDACTONE) 25 MG tablet Take 1 tablet (25 mg total) by mouth daily. 90 tablet 3   albuterol (VENTOLIN HFA) 108 (90 Base) MCG/ACT inhaler Inhale 2 puffs into the lungs every 6 (six) hours as needed for wheezing or shortness of breath. (Patient not taking: Reported on 05/06/2022) 6.7 g 0   nicotine (NICODERM CQ - DOSED IN MG/24 HOURS) 21 mg/24hr patch Place 1 patch (21 mg total) onto the skin daily. (Patient not taking: Reported on 05/06/2022) 28 patch 0   No facility-administered medications prior to visit.

## 2022-05-06 NOTE — Telephone Encounter (Signed)
[facility]. Calling needs more info on prescriptions that were called in on pt.

## 2022-05-06 NOTE — Telephone Encounter (Signed)
I spoke with the pharmacy. She wanted to confirm that you want him to [place] the Symbicort 2 puffs every 4 hours as needed. She said the normal dose is 2 puffs BID. She said if you are trying to do the new "higher dose" script then you will have to put a chronic diagnosis on the script like Emphysema or COPD. She said insurance will look at SOB as an acute diagnosis.

## 2022-05-07 MED ORDER — BUDESONIDE-FORMOTEROL FUMARATE 80-4.5 MCG/ACT IN AERO: 2.0000 | INHALATION_SPRAY | RESPIRATORY_TRACT | 0 refills | Status: DC | PRN

## 2022-05-07 NOTE — Telephone Encounter (Signed)
He does have emphysema and this would be an appropriate diagnosis. I would like him to use the Symbicort every 4 hours as needed. Both shortness of breath and emphysema would be appropriate diagnosis, whichever gets the job done is fine by me.  Thank you [employer]

## 2022-05-07 NOTE — Telephone Encounter (Signed)
Spoke to [employer] with [employer] and relayed below message. She voiced her understanding.  Rx sent with updated dx.  Nothing further needed.

## 2022-05-07 NOTE — Telephone Encounter (Signed)
Dr. Genia Harold, which dx would you like to use? Emphysema?

## 2022-05-08 ENCOUNTER — Telehealth: Payer: Self-pay | Admitting: Urology

## 2022-05-08 ENCOUNTER — Ambulatory Visit: Payer: Medicaid Other | Admitting: Internal Medicine

## 2022-05-08 ENCOUNTER — Other Ambulatory Visit
Admission: RE | Admit: 2022-05-08 | Discharge: 2022-05-08 | Disposition: A | Discharge status: Home / Self Care | Payer: Medicaid Other | Source: Ambulatory Visit | Attending: Internal Medicine | Admitting: Internal Medicine

## 2022-05-08 VITALS — BP 126/77 | HR 91

## 2022-05-08 DIAGNOSIS — I2510 Atherosclerotic heart disease of native coronary artery without angina pectoris: Secondary | ICD-10-CM

## 2022-05-08 DIAGNOSIS — I5032 Chronic diastolic (congestive) heart failure: Secondary | ICD-10-CM

## 2022-05-08 DIAGNOSIS — Z720 Tobacco use: Secondary | ICD-10-CM

## 2022-05-08 DIAGNOSIS — M792 Neuralgia and neuritis, unspecified: Secondary | ICD-10-CM

## 2022-05-08 DIAGNOSIS — F1490 Cocaine use, unspecified, uncomplicated: Secondary | ICD-10-CM

## 2022-05-08 LAB — BASIC METABOLIC PANEL
Anion gap: 10 (ref 5–15)
BUN: 19 mg/dL (ref 6–20)
CO2: 22 mmol/L (ref 22–32)
Calcium: 9.1 mg/dL (ref 8.9–10.3)
Chloride: 104 mmol/L (ref 98–111)
Creatinine, Ser: 1.47 mg/dL — ABNORMAL HIGH (ref 0.61–1.24)
GFR, Estimated: 58 mL/min — ABNORMAL LOW (ref >60)
Glucose, Bld: 99 mg/dL (ref 70–99)
Potassium: 4.1 mmol/L (ref 3.5–5.1)
Sodium: 136 mmol/L (ref 135–145)

## 2022-05-08 LAB — BRAIN NATRIURETIC PEPTIDE: B Natriuretic Peptide: 47.8 pg/mL (ref 0.0–100.0)

## 2022-05-08 MED ORDER — GABAPENTIN 100 MG PO CAPS: 100.0000 mg | ORAL_CAPSULE | Freq: Two times a day (BID) | ORAL | 2 refills | Status: DC

## 2022-05-08 MED ORDER — METOPROLOL SUCCINATE ER 25 MG PO TB24: 50.0000 mg | ORAL_TABLET | Freq: Every day | ORAL | 3 refills | Status: DC

## 2022-05-08 NOTE — Telephone Encounter (Signed)
Called pt to inform him of information below per Dr. Diamantina Providence no answer. Unable to leave message as voicemail is not set up. Mychart message sent.

## 2022-05-08 NOTE — Telephone Encounter (Signed)
Patient stopped by the office because he saw the Heart failure clinic today and he said they had concerns about his urethra closing up and him building up fluid. He has an appointment on 05/27/22 and they thought that was to long and that he needed to be seen sooner. Please advise. Call back is [phone]

## 2022-05-08 NOTE — Patient Instructions (Signed)
INCREASE Torpol XL to '50mg'$  daily  TAKE Gabapentin '100mg'$  twice daily   You have been referred to cardiac rehab they will contact you to schedule  Routine lab work today. Will notify you of abnormal results  Follow up in 2 months  Do the following things EVERYDAY: Weigh yourself in the morning before breakfast. Write it down and keep it in a log. Take your medicines as prescribed Eat low salt foods--Limit salt (sodium) to 2000 mg per day.  Stay as active as you can everyday Limit all fluids for the day to less than 2 liters

## 2022-05-08 NOTE — Progress Notes (Signed)
Advanced Heart Failure Clinic Note   Referring Physician: Dr. Garen Lah PCP: Renee Rival, FNP PCP-Cardiologist: Kate Sable, MD   HPI:  Logan Crawford is a 47 y.o. male with HTN, polysubstance abuse, tobacco use disorder, CAD s/p PCI in 04/2021, CABG in 12/23.   Hospitalized from 9/23 with NSTEMI. Cath on 9/25 showing significant two-vessel coronary artery disease which was  treated medically and on DAPT. Readmitted 12/23 with NSTEMI.  Cath 02/13/22 which showed recurrent in-stent restenosis in the LAD in the setting of continued cocaine use and poor compliance with antiplatelet medications.  Patient also has significant progression of the RCA disease.  Pre-op EF normal.   Underwent CAG 02/19/22  (LIMA - Diag, SVG - LAD, SVG - RCA). Operative course complicated by severe hyperkalemia (prior to cardiopulmonary bypass with question of malignant hyperthermia syndrome - received dantrolene). Early post-op course was notable for shock physiology and AKI. Required IABP and multiple gtts which were eventually weaned ECHO EF 55-60%. Also had some mild sternal drainage treated with abx Discharged 03/01/22.   I saw him last month and was volume overloaded with large left pleural effusion. Underwent tap with IR on 03/10/22 with 750cc out. Started on Furoscix  Saw General Cards 1/22 with concern for recurrent effusion. Stat CXR with trace effusion.   Admitted last week with CP. Felt to have pericarditis. Had mild volume overload. Echo EF 55%  Here for f/u.Feeling better. Main complaint is tingling in hands and also recurrent problems with bladder emptying (had a stricture). Has been out of gabapentin. CP much better. Doing all activities without problems. Playing pool for a couple hours/day. Just got Medicaid so now willing to do CR.   Past Medical History:  Diagnosis Date   CAD (coronary artery disease)    a. 11/2019 NSTEMI/PCI: LM min irregs, LAD 45m(2.75x22 Resolute Onyx DES), 40d, D2 min  irregs, LCX/LPAV min irregs, RCA 60ost, 40p/m, EF 45-50%; b. 11/2020 MV: EF 43%, small area of apical ischemia ->? over processing-->low risk.   Hyperlipidemia    Hypertension    Ischemic cardiomyopathy    a. 11/2019 LV Gram: EF 45-50%; b. 12/2019 Echo: EF 50% w/ mod mid-apical anteroseptal HK, GrI DD, nl RV fxn, mild Ao sclerosis.   Tobacco use 11/25/2019    Current Outpatient Medications  Medication Sig Dispense Refill   aspirin EC 81 MG tablet Take 1 tablet (81 mg total) by mouth daily. Swallow whole. 90 tablet 3   budesonide-formoterol (SYMBICORT) 80-4.5 MCG/ACT inhaler Inhale 2 puffs into the lungs every 4 (four) hours as needed. 3 each 0   clopidogrel (PLAVIX) 75 MG tablet Take 1 tablet (75 mg total) by mouth daily. 90 tablet 3   colchicine 0.6 MG tablet Take 1 tablet (0.6 mg total) by mouth 2 (two) times daily. 60 tablet 1   dextromethorphan-guaiFENesin (MUCINEX DM) 30-600 MG 12hr tablet Take 1 tablet by mouth 2 (two) times daily as needed for cough. 30 tablet 0   empagliflozin (JARDIANCE) 10 MG TABS tablet Take 10 mg by mouth daily.     famotidine (PEPCID) 20 MG tablet Take 1 tablet (20 mg total) by mouth 2 (two) times daily. 60 tablet 3   furosemide (LASIX) 40 MG tablet Take 1 tablet (40 mg total) by mouth daily. 90 tablet 3   losartan (COZAAR) 50 MG tablet Take 1 tablet (50 mg total) by mouth daily. 90 tablet 3   metoprolol succinate (TOPROL-XL) 25 MG 24 hr tablet Take 1 tablet (25 mg total)  by mouth daily. Stop taking metoprolol if you continue to use cocaine 90 tablet 3   nicotine (NICODERM CQ - DOSED IN MG/24 HOURS) 21 mg/24hr patch Place 1 patch (21 mg total) onto the skin daily. 28 patch 0   nitroGLYCERIN (NITROSTAT) 0.4 MG SL tablet Place 1 tablet (0.4 mg total) under the tongue every 5 (five) minutes as needed for chest pain (if chest pain not resolved after 3 doses, please call 911). 25 tablet 2   pantoprazole (PROTONIX) 40 MG tablet Take 1 tablet (40 mg total) by mouth 2  (two) times daily. 60 tablet 1   rosuvastatin (CRESTOR) 40 MG tablet Take 1 tablet (40 mg total) by mouth daily. 90 tablet 3   spironolactone (ALDACTONE) 25 MG tablet Take 1 tablet (25 mg total) by mouth daily. 90 tablet 3   No current facility-administered medications for this visit.    No Known Allergies    Social History   Socioeconomic History   Marital status: Single    Spouse name: Not on file   Number of children: 3   Years of education: Not on file   Highest education level: Not on file  Occupational History   Not on file  Tobacco Use   Smoking status: Some Days    Packs/day: 0.25    Years: 10.00    Additional pack years: 0.00    Total pack years: 2.50    Types: Cigarettes    Passive exposure: Past   Smokeless tobacco: Never   Tobacco comments:    Smokes 1-2 cigarettes every 2-3 days-05/06/2022  Vaping Use   Vaping Use: Never used  Substance and Sexual Activity   Alcohol use: Yes    Alcohol/week: 1.0 standard drink of alcohol    Types: 1 Shots of liquor per week    Comment: socially   Drug use: Yes    Types: Codeine   Sexual activity: Yes  Other Topics Concern   Not on file  Social History Narrative   Lives with his uncle    Social Determinants of Health   Financial Resource Strain: Not on file  Food Insecurity: No Food Insecurity (05/01/2022)   Hunger Vital Sign    Worried About Running Out of Food in the Last Year: Never true    Ran Out of Food in the Last Year: Never true  Transportation Needs: No Transportation Needs (05/01/2022)   PRAPARE - Hydrologist (Medical): No    Lack of Transportation (Non-Medical): No  Physical Activity: Not on file  Stress: Not on file  Social Connections: Not on file  Intimate Partner Violence: Not At Risk (05/01/2022)   Humiliation, Afraid, Rape, and Kick questionnaire    Fear of Current or Ex-Partner: No    Emotionally Abused: No    Physically Abused: No    Sexually Abused: No       Family History  Problem Relation Age of Onset   Coronary artery disease Mother    Arrhythmia Mother        Pacemaker placed in 2021   Heart failure Mother    Coronary artery disease Father     Vitals:   05/08/22 1010  BP: 126/77  Pulse: 91  SpO2: 98%    Wt Readings from Last 3 Encounters:  05/06/22 222 lb (100.7 kg)  05/03/22 219 lb 5.7 oz (99.5 kg)  04/25/22 223 lb 9.6 oz (101.4 kg)     PHYSICAL EXAM: General:  Well appearing. No resp  difficulty HEENT: normal Neck: supple. no JVD. Carotids 2+ bilat; no bruits. No lymphadenopathy or thryomegaly appreciated. Cor: PMI nondisplaced. Regular rate & rhythm. No rubs, gallops or murmurs. Lungs: clear Abdomen: soft, nontender, nondistended. No hepatosplenomegaly. No bruits or masses. Good bowel sounds. Extremities: no cyanosis, clubbing, rash, edema Neuro: alert & orientedx3, cranial nerves grossly intact. moves all 4 extremities w/o difficulty. Affect pleasant   ASSESSMENT & PLAN:  1. Chronic diastolic HF - S/p CABG [id number].  - Echo 12/29 with EF 50-55%, mild AI, mild MR.  - Bedside echo 03/07/22 in clinic LVEF 60% RV ok. No pericardial effusion - Echo 05/01/22 EF 55% . NYHA II - Volume status looks good - Increase to Toprol 50 mg daily - Continue losartan 25 mg daily  - Continue spironolactone 25 mg daily.  - Continue Jardiance10 mg daily.   - Labs today   2. CAD with NSTEMI -Hx prior PCI/stent to LAD in 2021 and PCI/stent to LAD in 03/23 d/t in-stent restenosis -NSTEMI 12/23 S/p CABG X 3 on 02/19/22 (LIMA to Diag, SVG to LAD, SVG to RCA) -no s/s angina. Continue DAPT - Refer CR   3. CKD 3a -Baseline Scr 1.3-1.6 - labs today   4. Polysubstance abuse -Tobacco and cocaine -UDS + for cocaine on admit - Discussed need for cessation   5. Peripheral neuropathy - will renew gabapentin x 3 months. - refer to PCP   Glori Bickers, MD 05/08/22

## 2022-05-08 NOTE — Telephone Encounter (Signed)
Pt called back.  He can't get on to [facility].  He is waiting by the phone for [facility] to return the call and explain Dr. Diamantina Providence message  [phone]

## 2022-05-09 NOTE — Telephone Encounter (Signed)
Called pt he states that he was in the hospital for 3 days and during that time he was unable to cic for stricture. He is not unable to get the catheter into his penis at all, he is concerned he may go into urinary retention. He states that he is still able to void, but has noticed a decrease in his stream. Patient's appointment moved up. Pt given precautions on when to seek care in ED if unable to urinate. Pt voiced understanding.

## 2022-05-13 ENCOUNTER — Ambulatory Visit (INDEPENDENT_AMBULATORY_CARE_PROVIDER_SITE_OTHER): Payer: Medicaid Other | Admitting: Urology

## 2022-05-13 ENCOUNTER — Encounter: Payer: Medicaid Other | Attending: Cardiology | Admitting: *Deleted

## 2022-05-13 ENCOUNTER — Encounter: Payer: Self-pay | Admitting: Urology

## 2022-05-13 VITALS — BP 114/76 | HR 97 | Ht 72.0 in | Wt 226.0 lb

## 2022-05-13 DIAGNOSIS — N99115 Postprocedural fossa navicularis urethral stricture: Secondary | ICD-10-CM | POA: Diagnosis not present

## 2022-05-13 DIAGNOSIS — Z951 Presence of aortocoronary bypass graft: Secondary | ICD-10-CM | POA: Insufficient documentation

## 2022-05-13 NOTE — Progress Notes (Signed)
   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  He was performing self dilations daily which was going well and he was voiding with a strong stream, however he was hospitalized in early March for cardiac issues and did not catheterize for 3 to 4 days.  He did notice some weakening of his stream and he was unable to pass the catheter for the distal dilation.  ------------------------------------------------------------------  He was prepped and draped in standard sterile fashion.  Lidocaine was injected into the meatus.  A curved hemostat was used to gently dilate the fossa navicularis stricture.  A 14 French Coloplast coud catheter passed easily to the mid urethra.  -------------------------------------------------------------------  I recommended he continue the daily self dilations for at least 6 weeks, then can consider spacing to every other day.  We discussed alternatives like referral to tertiary care center to consider reconstruction, but he is not interested at this time.  We reviewed the Optilume balloon is more indicated for proximal urethral strictures, but could consider in the future if he is unwilling to undergo more aggressive reconstruction, or cannot come off anticoagulation in the setting of his cardiac issues.  Continue daily catheterizations for 6 weeks, then every other day for fossa navicularis stricture RTC 3 months  Billey Co, MD  [facility] [address] [place], [place] [address] [phone]

## 2022-05-13 NOTE — Progress Notes (Signed)
Initial phone call completed. Diagnosis can be found in [facility] 12/25. EP Orientation scheduled for Tuesday 3/26 at 9:30. Logan Crawford has recently quit tobacco use within the last 6 months. Intervention for relapse prevention was provided at the initial medical review. He was encouraged to continue to with tobacco cessation and was provided information on relapse prevention. Patient received information about combination therapy, tobacco cessation classes, quit line, and quit smoking apps in case of a relapse. Patient demonstrated understanding of this material.Staff will continue to provide encouragement and follow up with the patient throughout the program.

## 2022-05-13 NOTE — Patient Instructions (Signed)
continue self dilation daily for 6 weeks, then every other day

## 2022-05-20 VITALS — Ht 72.0 in | Wt 226.8 lb

## 2022-05-20 DIAGNOSIS — Z951 Presence of aortocoronary bypass graft: Secondary | ICD-10-CM | POA: Diagnosis present

## 2022-05-20 NOTE — Progress Notes (Signed)
Cardiac Individual Treatment Plan  Patient Details  Name: Logan Crawford MRN: [id number] Date of Birth: [birth year] Referring Provider:   Flowsheet Row Cardiac Rehab from 05/20/2022 in [facility] Cardiac and Pulmonary Rehab  Referring Provider Dr. Kate Sable, MD       Initial Encounter Date:  Flowsheet Row Cardiac Rehab from 05/20/2022 in [facility] Cardiac and Pulmonary Rehab  Date 05/20/22       Visit Diagnosis: S/P CABG x 3  Patient's Home Medications on Admission:  Current Outpatient Medications:    aspirin EC 81 MG tablet, Take 1 tablet (81 mg total) by mouth daily. Swallow whole., Disp: 90 tablet, Rfl: 3   budesonide-formoterol (SYMBICORT) 80-4.5 MCG/ACT inhaler, Inhale 2 puffs into the lungs every 4 (four) hours as needed., Disp: 3 each, Rfl: 0   clopidogrel (PLAVIX) 75 MG tablet, Take 1 tablet (75 mg total) by mouth daily., Disp: 90 tablet, Rfl: 3   colchicine 0.6 MG tablet, Take 1 tablet (0.6 mg total) by mouth 2 (two) times daily., Disp: 60 tablet, Rfl: 1   dextromethorphan-guaiFENesin (MUCINEX DM) 30-600 MG 12hr tablet, Take 1 tablet by mouth 2 (two) times daily as needed for cough., Disp: 30 tablet, Rfl: 0   empagliflozin (JARDIANCE) 10 MG TABS tablet, Take 10 mg by mouth daily., Disp: , Rfl:    famotidine (PEPCID) 20 MG tablet, Take 1 tablet (20 mg total) by mouth 2 (two) times daily., Disp: 60 tablet, Rfl: 3   furosemide (LASIX) 40 MG tablet, Take 1 tablet (40 mg total) by mouth daily., Disp: 90 tablet, Rfl: 3   gabapentin (NEURONTIN) 100 MG capsule, Take 1 capsule (100 mg total) by mouth 2 (two) times daily., Disp: 60 capsule, Rfl: 2   losartan (COZAAR) 50 MG tablet, Take 1 tablet (50 mg total) by mouth daily., Disp: 90 tablet, Rfl: 3   metoprolol succinate (TOPROL-XL) 25 MG 24 hr tablet, Take 2 tablets (50 mg total) by mouth daily. Stop taking metoprolol if you continue to use cocaine, Disp: 180 tablet, Rfl: 3   nicotine (NICODERM CQ - DOSED IN MG/24 HOURS) 21 mg/24hr  patch, Place 1 patch (21 mg total) onto the skin daily., Disp: 28 patch, Rfl: 0   nitroGLYCERIN (NITROSTAT) 0.4 MG SL tablet, Place 1 tablet (0.4 mg total) under the tongue every 5 (five) minutes as needed for chest pain (if chest pain not resolved after 3 doses, please call 911)., Disp: 25 tablet, Rfl: 2   pantoprazole (PROTONIX) 40 MG tablet, Take 1 tablet (40 mg total) by mouth 2 (two) times daily., Disp: 60 tablet, Rfl: 1   rosuvastatin (CRESTOR) 40 MG tablet, Take 1 tablet (40 mg total) by mouth daily., Disp: 90 tablet, Rfl: 3   spironolactone (ALDACTONE) 25 MG tablet, Take 1 tablet (25 mg total) by mouth daily., Disp: 90 tablet, Rfl: 3  Past Medical History: Past Medical History:  Diagnosis Date   CAD (coronary artery disease)    a. 11/2019 NSTEMI/PCI: LM min irregs, LAD 58m (2.75x22 Resolute Onyx DES), 40d, D2 min irregs, LCX/LPAV min irregs, RCA 60ost, 40p/m, EF 45-50%; b. 11/2020 MV: EF 43%, small area of apical ischemia ->? over processing-->low risk.   Hyperlipidemia    Hypertension    Ischemic cardiomyopathy    a. 11/2019 LV Gram: EF 45-50%; b. 12/2019 Echo: EF 50% w/ mod mid-apical anteroseptal HK, GrI DD, nl RV fxn, mild Ao sclerosis.   Tobacco use 11/25/2019    Tobacco Use: Social History   Tobacco Use  Smoking Status  Some Days   Packs/day: 0.25   Years: 10.00   Additional pack years: 0.00   Total pack years: 2.50   Types: Cigarettes   Passive exposure: Past  Smokeless Tobacco Never  Tobacco Comments   Smokes 1-2 cigarettes every 2-3 days-05/06/2022    Labs: Review Flowsheet  More data exists      Latest Ref Rng & Units 02/21/2022 02/22/2022 02/23/2022 04/14/2022 05/02/2022  Labs for ITP Cardiac and Pulmonary Rehab  Cholestrol 0 - 200 mg/dL - - - - 138   LDL (calc) 0 - 99 mg/dL - - - - 46   HDL-C >40 mg/dL - - - - 26   Trlycerides <150 mg/dL - - - - 331   Hemoglobin A1c 4.8 - 5.6 % - - - 5.2  -  O2 Saturation % 62.6  44.6  52.6  44.8  - -     Exercise  Target Goals: Exercise Program Goal: Individual exercise prescription set using results from initial 6 min walk test and THRR while considering  patient's activity barriers and safety.   Exercise Prescription Goal: Initial exercise prescription builds to 30-45 minutes a day of aerobic activity, 2-3 days per week.  Home exercise guidelines will be given to patient during program as part of exercise prescription that the participant will acknowledge.   Education: Aerobic Exercise: - Group verbal and visual presentation on the components of exercise prescription. Introduces F.I.T.T principle from ACSM for exercise prescriptions.  Reviews F.I.T.T. principles of aerobic exercise including progression. Written material given at graduation. Flowsheet Row Cardiac Rehab from 05/20/2022 in [facility] Cardiac and Pulmonary Rehab  Education need identified 05/20/22       Education: Resistance Exercise: - Group verbal and visual presentation on the components of exercise prescription. Introduces F.I.T.T principle from ACSM for exercise prescriptions  Reviews F.I.T.T. principles of resistance exercise including progression. Written material given at graduation.    Education: Exercise & Equipment Safety: - Individual verbal instruction and demonstration of equipment use and safety with use of the equipment. Flowsheet Row Cardiac Rehab from 05/20/2022 in [facility] Cardiac and Pulmonary Rehab  Date 05/20/22  Educator NT  Instruction Review Code 1- Verbalizes Understanding       Education: Exercise Physiology & General Exercise Guidelines: - Group verbal and written instruction with models to review the exercise physiology of the cardiovascular system and associated critical values. Provides general exercise guidelines with specific guidelines to those with heart or lung disease.    Education: Flexibility, Balance, Mind/Body Relaxation: - Group verbal and visual presentation with interactive activity on the  components of exercise prescription. Introduces F.I.T.T principle from ACSM for exercise prescriptions. Reviews F.I.T.T. principles of flexibility and balance exercise training including progression. Also discusses the mind body connection.  Reviews various relaxation techniques to help reduce and manage stress (i.e. Deep breathing, progressive muscle relaxation, and visualization). Balance handout provided to take home. Written material given at graduation.   Activity Barriers & Risk Stratification:  Activity Barriers & Cardiac Risk Stratification - 05/20/22 1429       Activity Barriers & Cardiac Risk Stratification   Activity Barriers Back Problems;Shortness of Breath   Occasional pain in knees, feet, and hands   Cardiac Risk Stratification High             6 Minute Walk:  6 Minute Walk     Row Name 05/20/22 1425         6 Minute Walk   Phase Initial     Distance  1215 feet     Walk Time 6 minutes     # of Rest Breaks 0     MPH 2.3     METS 3.93     RPE 10     Perceived Dyspnea  1     VO2 Peak 13.74     Symptoms No     Resting HR 95 bpm     Resting BP 124/66     Resting Oxygen Saturation  98 %     Exercise Oxygen Saturation  during 6 min walk 97 %     Max Ex. HR 111 bpm     Max Ex. BP 132/74     2 Minute Post BP 116/72              Oxygen Initial Assessment:   Oxygen Re-Evaluation:   Oxygen Discharge (Final Oxygen Re-Evaluation):   Initial Exercise Prescription:  Initial Exercise Prescription - 05/20/22 1400       Date of Initial Exercise RX and Referring Provider   Date 05/20/22    Referring Provider Dr. Kate Sable, MD      Oxygen   Maintain Oxygen Saturation 88% or higher      Treadmill   MPH 2    Grade 1    Minutes 15    METs 2.81      NuStep   Level 3    SPM 80    Minutes 15    METs 3.93      Recumbant Elliptical   Level 1.5    RPM 50    Minutes 15    METs 3.93      Prescription Details   Frequency (times per week) 2     Duration Progress to 30 minutes of continuous aerobic without signs/symptoms of physical distress      Intensity   THRR 40-80% of Max Heartrate 125-156    Ratings of Perceived Exertion 11-13    Perceived Dyspnea 0-4      Progression   Progression Continue to progress workloads to maintain intensity without signs/symptoms of physical distress.      Resistance Training   Training Prescription Yes    Weight 4 lb    Reps 10-15             Perform Capillary Blood Glucose checks as needed.  Exercise Prescription Changes:   Exercise Prescription Changes     Row Name 05/20/22 1400             Response to Exercise   Blood Pressure (Admit) 124/66       Blood Pressure (Exercise) 132/74       Blood Pressure (Exit) 116/72       Heart Rate (Admit) 95 bpm       Heart Rate (Exercise) 111 bpm       Heart Rate (Exit) 98 bpm       Oxygen Saturation (Admit) 98 %       Oxygen Saturation (Exercise) 97 %       Rating of Perceived Exertion (Exercise) 10       Perceived Dyspnea (Exercise) 1       Symptoms None       Comments 6MWT Results                Exercise Comments:   Exercise Goals and Review:   Exercise Goals     Row Name 05/20/22 1430  Exercise Goals   Increase Physical Activity Yes       Intervention Provide advice, education, support and counseling about physical activity/exercise needs.;Develop an individualized exercise prescription for aerobic and resistive training based on initial evaluation findings, risk stratification, comorbidities and participant's personal goals.       Expected Outcomes Short Term: Attend rehab on a regular basis to increase amount of physical activity.;Long Term: Exercising regularly at least 3-5 days a week.;Long Term: Add in home exercise to make exercise part of routine and to increase amount of physical activity.       Increase Strength and Stamina Yes       Intervention Provide advice, education, support and  counseling about physical activity/exercise needs.;Develop an individualized exercise prescription for aerobic and resistive training based on initial evaluation findings, risk stratification, comorbidities and participant's personal goals.       Expected Outcomes Short Term: Increase workloads from initial exercise prescription for resistance, speed, and METs.;Short Term: Perform resistance training exercises routinely during rehab and add in resistance training at home;Long Term: Improve cardiorespiratory fitness, muscular endurance and strength as measured by increased METs and functional capacity (6MWT)       Able to understand and use rate of perceived exertion (RPE) scale Yes       Intervention Provide education and explanation on how to use RPE scale       Expected Outcomes Short Term: Able to use RPE daily in rehab to express subjective intensity level;Long Term:  Able to use RPE to guide intensity level when exercising independently       Able to understand and use Dyspnea scale Yes       Intervention Provide education and explanation on how to use Dyspnea scale       Expected Outcomes Short Term: Able to use Dyspnea scale daily in rehab to express subjective sense of shortness of breath during exertion;Long Term: Able to use Dyspnea scale to guide intensity level when exercising independently       Knowledge and understanding of Target Heart Rate Range (THRR) Yes       Intervention Provide education and explanation of THRR including how the numbers were predicted and where they are located for reference       Expected Outcomes Short Term: Able to state/look up THRR;Long Term: Able to use THRR to govern intensity when exercising independently;Short Term: Able to use daily as guideline for intensity in rehab       Able to check pulse independently Yes       Intervention Provide education and demonstration on how to check pulse in carotid and radial arteries.;Review the importance of being able to  check your own pulse for safety during independent exercise       Expected Outcomes Short Term: Able to explain why pulse checking is important during independent exercise;Long Term: Able to check pulse independently and accurately       Understanding of Exercise Prescription Yes       Intervention Provide education, explanation, and written materials on patient's individual exercise prescription       Expected Outcomes Short Term: Able to explain program exercise prescription;Long Term: Able to explain home exercise prescription to exercise independently                Exercise Goals Re-Evaluation :   Discharge Exercise Prescription (Final Exercise Prescription Changes):  Exercise Prescription Changes - 05/20/22 1400       Response to Exercise   Blood Pressure (  Admit) 124/66    Blood Pressure (Exercise) 132/74    Blood Pressure (Exit) 116/72    Heart Rate (Admit) 95 bpm    Heart Rate (Exercise) 111 bpm    Heart Rate (Exit) 98 bpm    Oxygen Saturation (Admit) 98 %    Oxygen Saturation (Exercise) 97 %    Rating of Perceived Exertion (Exercise) 10    Perceived Dyspnea (Exercise) 1    Symptoms None    Comments 6MWT Results             Nutrition:  Target Goals: Understanding of nutrition guidelines, daily intake of sodium 1500mg , cholesterol 200mg , calories 30% from fat and 7% or less from saturated fats, daily to have 5 or more servings of fruits and vegetables.  Education: All About Nutrition: -Group instruction provided by verbal, written material, interactive activities, discussions, models, and posters to present general guidelines for heart healthy nutrition including fat, fiber, MyPlate, the role of sodium in heart healthy nutrition, utilization of the nutrition label, and utilization of this knowledge for meal planning. Follow up email sent as well. Written material given at graduation. Flowsheet Row Cardiac Rehab from 05/20/2022 in [facility] Cardiac and Pulmonary Rehab   Education need identified 05/20/22       Biometrics:  Pre Biometrics - 05/20/22 1432       Pre Biometrics   Height 6' (1.829 m)    Weight 226 lb 12.8 oz (102.9 kg)    Waist Circumference 40.5 inches    Hip Circumference 43 inches    Waist to Hip Ratio 0.94 %    BMI (Calculated) 30.75    Single Leg Stand 30 seconds              Nutrition Therapy Plan and Nutrition Goals:  Nutrition Therapy & Goals - 05/20/22 1131       Nutrition Therapy   Diet Heart healthy, low Na    Drug/Food Interactions Statins/Certain Fruits    Protein (specify units) 65g   CKD stg 3: 0.6g/kg/d   Fiber 30 grams    Whole Grain Foods 3 servings    Saturated Fats 16 max. grams    Fruits and Vegetables 8 servings/day    Sodium 2 grams      Personal Nutrition Goals   Nutrition Goal ST: review handouts, focus on lean meats, limit creamy or cheese sauces or add ons, and add fruit and vegetable servings to meals when going out LT: limit Na <2g, limit saturated fat <16g/day, aim for at least 30g of fiber/day    Comments 47 y.o. M admitted to cardiac rehab s/p CABG x 3. PMHx includes HTN, NSTEMI (2021 and 2023), CAD, chronic diastolic CHF, GERD, Stg 3a CKD, HLD, polysubstance abuse, former tobacco use, hypercalcemia. Medications reviewed symbicort, jardiance, pepcid, furosemide, nicotine, pantoprazole, rosuvastatin. B: sausage egg and cheese on toast or raisin bran L: chik-fil-a or grilled chicken or ribeye steak sandwich - he has been trying to limit McDonalds burger D: [place] food 2x/week, sushi, salmon S; chips or candy bars or pickles Drinks: water, gatorade (grape - regular) 2-3x/day and sweet tea 3-4x/day. He reports not cooking at home most of the time and would like to focus on modifications eating out as he does not want to cook - discussed some options to put together at home too like rotisserie chicken, microwave grains like brown rice, and frozen vegetables. Also discussed general heart healthy  eating and considerations while eating out.      Intervention Plan  Intervention Prescribe, educate and counsel regarding individualized specific dietary modifications aiming towards targeted core components such as weight, hypertension, lipid management, diabetes, heart failure and other comorbidities.;Nutrition handout(s) given to patient.    Expected Outcomes Short Term Goal: Understand basic principles of dietary content, such as calories, fat, sodium, cholesterol and nutrients.;Short Term Goal: A plan has been developed with personal nutrition goals set during dietitian appointment.;Long Term Goal: Adherence to prescribed nutrition plan.             Nutrition Assessments:  MEDIFICTS Score Key: ?70 Need to make dietary changes  40-70 Heart Healthy Diet ? 40 Therapeutic Level Cholesterol Diet  Flowsheet Row Cardiac Rehab from 05/20/2022 in [facility] Cardiac and Pulmonary Rehab  Picture Your Plate Total Score on Admission 50      Picture Your Plate Scores: [id number] Unhealthy dietary pattern with much room for improvement. 41-50 Dietary pattern unlikely to meet recommendations for good health and room for improvement. 51-60 More healthful dietary pattern, with some room for improvement.  >60 Healthy dietary pattern, although there may be some specific behaviors that could be improved.    Nutrition Goals Re-Evaluation:   Nutrition Goals Discharge (Final Nutrition Goals Re-Evaluation):   Psychosocial: Target Goals: Acknowledge presence or absence of significant depression and/or stress, maximize coping skills, provide positive support system. Participant is able to verbalize types and ability to use techniques and skills needed for reducing stress and depression.   Education: Stress, Anxiety, and Depression - Group verbal and visual presentation to define topics covered.  Reviews how body is impacted by stress, anxiety, and depression.  Also discusses healthy ways to reduce stress and  to treat/manage anxiety and depression.  Written material given at graduation.   Education: Sleep Hygiene -Provides group verbal and written instruction about how sleep can affect your health.  Define sleep hygiene, discuss sleep cycles and impact of sleep habits. Review good sleep hygiene tips.    Initial Review & Psychosocial Screening:  Initial Psych Review & Screening - 05/13/22 1408       Initial Review   Current issues with Current Sleep Concerns      Family Dynamics   Good Support System? Yes      Barriers   Psychosocial barriers to participate in program There are no identifiable barriers or psychosocial needs.;The patient should benefit from training in stress management and relaxation.      Screening Interventions   Interventions Encouraged to exercise;Provide feedback about the scores to participant;To provide support and resources with identified psychosocial needs    Expected Outcomes Short Term goal: Utilizing psychosocial counselor, staff and physician to assist with identification of specific Stressors or current issues interfering with healing process. Setting desired goal for each stressor or current issue identified.;Long Term Goal: Stressors or current issues are controlled or eliminated.;Short Term goal: Identification and review with participant of any Quality of Life or Depression concerns found by scoring the questionnaire.;Long Term goal: The participant improves quality of Life and PHQ9 Scores as seen by post scores and/or verbalization of changes             Quality of Life Scores:   Quality of Life - 05/20/22 1411       Quality of Life   Select Quality of Life      Quality of Life Scores   Health/Function Pre 14.17 %    Socioeconomic Pre 15.07 %    Psych/Spiritual Pre 17.36 %    Family Pre 18.3 %  GLOBAL Pre 15.62 %            Scores of 19 and below usually indicate a poorer quality of life in these areas.  A difference of  2-3 points is  a clinically meaningful difference.  A difference of 2-3 points in the total score of the Quality of Life Index has been associated with significant improvement in overall quality of life, self-image, physical symptoms, and general health in studies assessing change in quality of life.  PHQ-9: Review Flowsheet       05/20/2022 03/14/2022  Depression screen PHQ 2/9  Decreased Interest 0 0  Down, Depressed, Hopeless 0 0  PHQ - 2 Score 0 0  Altered sleeping 0 -  Tired, decreased energy 2 -  Change in appetite 0 -  Feeling bad or failure about yourself  0 -  Trouble concentrating 0 -  Moving slowly or fidgety/restless 0 -  Suicidal thoughts 0 -  PHQ-9 Score 2 -  Difficult doing work/chores Not difficult at all -   Interpretation of Total Score  Total Score Depression Severity:  1-4 = Minimal depression, 5-9 = Mild depression, 10-14 = Moderate depression, 15-19 = Moderately severe depression, 20-27 = Severe depression   Psychosocial Evaluation and Intervention:  Psychosocial Evaluation - 05/13/22 1412       Psychosocial Evaluation & Interventions   Interventions Encouraged to exercise with the program and follow exercise prescription    Comments Tobey 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 He is wearing a nicotine patch on and off and is trying to quit completly. He states he doesn't have really any stress concerns besides when he is in pain it makes things more difficult. He does report sleep concerns, that it is hard to stay asleep. He also needs a sleep study to evaluate for sleep apnea. He really enjoys playing pool and is back to playing since his CABG. He wants to work on exercising more and understanding how to live a heart healthy lifestyle.    Expected Outcomes Short: attend cardiac rehab for education and exercise. Long: develop and maintain positive self care habits.    Continue Psychosocial Services  Follow up required by  staff             Psychosocial Re-Evaluation:   Psychosocial Discharge (Final Psychosocial Re-Evaluation):   Vocational Rehabilitation: Provide vocational rehab assistance to qualifying candidates.   Vocational Rehab Evaluation & Intervention:  Vocational Rehab - 05/13/22 1408       Initial Vocational Rehab Evaluation & Intervention   Assessment shows need for Vocational Rehabilitation No             Education: Education Goals: Education classes will be provided on a variety of topics geared toward better understanding of heart health and risk factor modification. Participant will state understanding/return demonstration of topics presented as noted by education test scores.  Learning Barriers/Preferences:  Learning Barriers/Preferences - 05/13/22 1408       Learning Barriers/Preferences   Learning Barriers None    Learning Preferences None             General Cardiac Education Topics:  AED/CPR: - Group verbal and written instruction with the use of models to demonstrate the basic use of the AED with the basic ABC's of resuscitation.   Anatomy and Cardiac Procedures: - Group verbal and visual presentation and models provide information about basic cardiac anatomy and function. Reviews the testing  methods done to diagnose heart disease and the outcomes of the test results. Describes the treatment choices: Medical Management, Angioplasty, or Coronary Bypass Surgery for treating various heart conditions including Myocardial Infarction, Angina, Valve Disease, and Cardiac Arrhythmias.  Written material given at graduation. Flowsheet Row Cardiac Rehab from 05/20/2022 in [facility] Cardiac and Pulmonary Rehab  Education need identified 05/20/22       Medication Safety: - Group verbal and visual instruction to review commonly prescribed medications for heart and lung disease. Reviews the medication, class of the drug, and side effects. Includes the steps to properly  store meds and maintain the prescription regimen.  Written material given at graduation.   Intimacy: - Group verbal instruction through game format to discuss how heart and lung disease can affect sexual intimacy. Written material given at graduation..   Know Your Numbers and Heart Failure: - Group verbal and visual instruction to discuss disease risk factors for cardiac and pulmonary disease and treatment options.  Reviews associated critical values for Overweight/Obesity, Hypertension, Cholesterol, and Diabetes.  Discusses basics of heart failure: signs/symptoms and treatments.  Introduces Heart Failure Zone chart for action plan for heart failure.  Written material given at graduation.   Infection Prevention: - Provides verbal and written material to individual with discussion of infection control including proper hand washing and proper equipment cleaning during exercise session. Flowsheet Row Cardiac Rehab from 05/20/2022 in [facility] Cardiac and Pulmonary Rehab  Date 05/20/22  Educator NT  Instruction Review Code 1- Verbalizes Understanding       Falls Prevention: - Provides verbal and written material to individual with discussion of falls prevention and safety. Flowsheet Row Cardiac Rehab from 05/20/2022 in [facility] Cardiac and Pulmonary Rehab  Date 05/20/22  Educator NT  Instruction Review Code 1- Verbalizes Understanding       Other: -Provides group and verbal instruction on various topics (see comments)   Knowledge Questionnaire Score:  Knowledge Questionnaire Score - 05/20/22 1411       Knowledge Questionnaire Score   Pre Score 19/26             Core Components/Risk Factors/Patient Goals at Admission:  Personal Goals and Risk Factors at Admission - 05/13/22 1406       Core Components/Risk Factors/Patient Goals on Admission   Tobacco Cessation Yes   wearing patch   Number of packs per day 0    Expected Outcomes Long Term: Complete abstinence from all tobacco  products for at least 12 months from quit date.    Hypertension Yes    Intervention Provide education on lifestyle modifcations including regular physical activity/exercise, weight management, moderate sodium restriction and increased consumption of fresh fruit, vegetables, and low fat dairy, alcohol moderation, and smoking cessation.;Monitor prescription use compliance.    Expected Outcomes Short Term: Continued assessment and intervention until BP is < 140/58mm HG in hypertensive participants. < 130/41mm HG in hypertensive participants with diabetes, heart failure or chronic kidney disease.;Long Term: Maintenance of blood pressure at goal levels.             Education:Diabetes - Individual verbal and written instruction to review signs/symptoms of diabetes, desired ranges of glucose level fasting, after meals and with exercise. Acknowledge that pre and post exercise glucose checks will be done for 3 sessions at entry of program.   Core Components/Risk Factors/Patient Goals Review:    Core Components/Risk Factors/Patient Goals at Discharge (Final Review):    ITP Comments:  ITP Comments     Row Name 05/13/22 1416  05/20/22 1402         ITP Comments Initial phone call completed. Diagnosis can be found in [facility] 12/25. EP Orientation scheduled for Tuesday 3/26 at 9:30.  Aldrick has recently quit tobacco use within the last 6 months. Intervention for relapse prevention was provided at the initial medical review. He was encouraged to continue to with tobacco cessation and was provided information on relapse prevention. Patient received information about combination therapy, tobacco cessation classes, quit line, and quit smoking apps in case of a relapse. Patient demonstrated understanding of this material.Staff will continue to provide encouragement and follow up with the patient throughout the program. Completed 6MWT and gym orientation. Initial ITP created and sent for review to Dr. Emily Filbert, Medical Director.               Comments: Initial ITP

## 2022-05-20 NOTE — Patient Instructions (Signed)
Patient Instructions  Patient Details  Name: Logan Crawford MRN: [id number] Date of Birth: 10/09/1975 Referring Provider:  Kate Sable, MD  Below are your personal goals for exercise, nutrition, and risk factors. Our goal is to help you stay on track towards obtaining and maintaining these goals. We will be discussing your progress on these goals with you throughout the program.  Initial Exercise Prescription:  Initial Exercise Prescription - 05/20/22 1400       Date of Initial Exercise RX and Referring Provider   Date 05/20/22    Referring Provider Dr. Kate Sable, MD      Oxygen   Maintain Oxygen Saturation 88% or higher      Treadmill   MPH 2    Grade 1    Minutes 15    METs 2.81      NuStep   Level 3    SPM 80    Minutes 15    METs 3.93      Recumbant Elliptical   Level 1.5    RPM 50    Minutes 15    METs 3.93      Prescription Details   Frequency (times per week) 2    Duration Progress to 30 minutes of continuous aerobic without signs/symptoms of physical distress      Intensity   THRR 40-80% of Max Heartrate 125-156    Ratings of Perceived Exertion 11-13    Perceived Dyspnea 0-4      Progression   Progression Continue to progress workloads to maintain intensity without signs/symptoms of physical distress.      Resistance Training   Training Prescription Yes    Weight 4 lb    Reps 10-15             Exercise Goals: Frequency: Be able to perform aerobic exercise two to three times per week in program working toward 2-5 days per week of home exercise.  Intensity: Work with a perceived exertion of 11 (fairly light) - 15 (hard) while following your exercise prescription.  We will make changes to your prescription with you as you progress through the program.   Duration: Be able to do 30 to 45 minutes of continuous aerobic exercise in addition to a 5 minute warm-up and a 5 minute cool-down routine.   Nutrition Goals: Your personal  nutrition goals will be established when you do your nutrition analysis with the dietician.  The following are general nutrition guidelines to follow: Cholesterol < 200mg /day Sodium < 1500mg /day Fiber: Men under 50 yrs - 38 grams per day  Personal Goals:  Personal Goals and Risk Factors at Admission - 05/13/22 1406       Core Components/Risk Factors/Patient Goals on Admission   Tobacco Cessation Yes   wearing patch   Number of packs per day 0    Expected Outcomes Long Term: Complete abstinence from all tobacco products for at least 12 months from quit date.    Hypertension Yes    Intervention Provide education on lifestyle modifcations including regular physical activity/exercise, weight management, moderate sodium restriction and increased consumption of fresh fruit, vegetables, and low fat dairy, alcohol moderation, and smoking cessation.;Monitor prescription use compliance.    Expected Outcomes Short Term: Continued assessment and intervention until BP is < 140/63mm HG in hypertensive participants. < 130/37mm HG in hypertensive participants with diabetes, heart failure or chronic kidney disease.;Long Term: Maintenance of blood pressure at goal levels.  Tobacco Use Initial Evaluation: Social History   Tobacco Use  Smoking Status Some Days   Packs/day: 0.25   Years: 10.00   Additional pack years: 0.00   Total pack years: 2.50   Types: Cigarettes   Passive exposure: Past  Smokeless Tobacco Never  Tobacco Comments   Smokes 1-2 cigarettes every 2-3 days-05/06/2022    Exercise Goals and Review:  Exercise Goals     Row Name 05/20/22 1430             Exercise Goals   Increase Physical Activity Yes       Intervention Provide advice, education, support and counseling about physical activity/exercise needs.;Develop an individualized exercise prescription for aerobic and resistive training based on initial evaluation findings, risk stratification, comorbidities  and participant's personal goals.       Expected Outcomes Short Term: Attend rehab on a regular basis to increase amount of physical activity.;Long Term: Exercising regularly at least 3-5 days a week.;Long Term: Add in home exercise to make exercise part of routine and to increase amount of physical activity.       Increase Strength and Stamina Yes       Intervention Provide advice, education, support and counseling about physical activity/exercise needs.;Develop an individualized exercise prescription for aerobic and resistive training based on initial evaluation findings, risk stratification, comorbidities and participant's personal goals.       Expected Outcomes Short Term: Increase workloads from initial exercise prescription for resistance, speed, and METs.;Short Term: Perform resistance training exercises routinely during rehab and add in resistance training at home;Long Term: Improve cardiorespiratory fitness, muscular endurance and strength as measured by increased METs and functional capacity (6MWT)       Able to understand and use rate of perceived exertion (RPE) scale Yes       Intervention Provide education and explanation on how to use RPE scale       Expected Outcomes Short Term: Able to use RPE daily in rehab to express subjective intensity level;Long Term:  Able to use RPE to guide intensity level when exercising independently       Able to understand and use Dyspnea scale Yes       Intervention Provide education and explanation on how to use Dyspnea scale       Expected Outcomes Short Term: Able to use Dyspnea scale daily in rehab to express subjective sense of shortness of breath during exertion;Long Term: Able to use Dyspnea scale to guide intensity level when exercising independently       Knowledge and understanding of Target Heart Rate Range (THRR) Yes       Intervention Provide education and explanation of THRR including how the numbers were predicted and where they are located for  reference       Expected Outcomes Short Term: Able to state/look up THRR;Long Term: Able to use THRR to govern intensity when exercising independently;Short Term: Able to use daily as guideline for intensity in rehab       Able to check pulse independently Yes       Intervention Provide education and demonstration on how to check pulse in carotid and radial arteries.;Review the importance of being able to check your own pulse for safety during independent exercise       Expected Outcomes Short Term: Able to explain why pulse checking is important during independent exercise;Long Term: Able to check pulse independently and accurately       Understanding of Exercise Prescription Yes  Intervention Provide education, explanation, and written materials on patient's individual exercise prescription       Expected Outcomes Short Term: Able to explain program exercise prescription;Long Term: Able to explain home exercise prescription to exercise independently

## 2022-05-21 ENCOUNTER — Ambulatory Visit: Payer: Medicaid Other

## 2022-05-22 ENCOUNTER — Encounter: Payer: Medicaid Other | Admitting: *Deleted

## 2022-05-27 ENCOUNTER — Ambulatory Visit: Payer: Self-pay | Admitting: Urology

## 2022-05-27 ENCOUNTER — Encounter: Payer: Medicaid Other | Attending: Cardiology | Admitting: *Deleted

## 2022-05-27 DIAGNOSIS — Z79899 Other long term (current) drug therapy: Secondary | ICD-10-CM | POA: Insufficient documentation

## 2022-05-27 DIAGNOSIS — Z951 Presence of aortocoronary bypass graft: Secondary | ICD-10-CM | POA: Diagnosis present

## 2022-05-27 NOTE — Progress Notes (Signed)
Daily Session Note  Patient Details  Name: Logan Crawford MRN: [id number] Date of Birth: 10/25/1975 Referring Provider:   Flowsheet Row Cardiac Rehab from 05/20/2022 in [facility] Cardiac and Pulmonary Rehab  Referring Provider Dr. Kate Sable, MD       Encounter Date: 05/27/2022  Check In:  Session Check In - 05/27/22 1111       Check-In   Supervising physician immediately available to respond to emergencies See telemetry face sheet for immediately available ER MD    Location [facility]-Cardiac & Pulmonary Rehab    Staff Present Renita Papa, RN BSN;Noah Tickle, BS, Exercise Physiologist;Kara Maricela Bo, MS, ACSM CEP, Exercise Physiologist;Other   Gretchen Short BA, Exercise Science   Virtual Visit No    Medication changes reported     No    Fall or balance concerns reported    No    Warm-up and Cool-down Performed on first and last piece of equipment    Resistance Training Performed Yes    VAD Patient? No    PAD/SET Patient? No      Pain Assessment   Currently in Pain? No/denies                Social History   Tobacco Use  Smoking Status Some Days   Packs/day: 0.25   Years: 10.00   Additional pack years: 0.00   Total pack years: 2.50   Types: Cigarettes   Passive exposure: Past  Smokeless Tobacco Never  Tobacco Comments   Smokes 1-2 cigarettes every 2-3 days-05/06/2022    Goals Met:  Independence with exercise equipment Exercise tolerated well No report of concerns or symptoms today Strength training completed today  Goals Unmet:  Not Applicable  Comments: First full day of exercise!  Patient was oriented to gym and equipment including functions, settings, policies, and procedures.  Patient's individual exercise prescription and treatment plan were reviewed.  All starting workloads were established based on the results of the 6 minute walk test done at initial orientation visit.  The plan for exercise progression was also introduced and progression will  be customized based on patient's performance and goals.    Dr. Emily Filbert is Medical Director for [facility].  Dr. Ottie Glazier is Medical Director for [facility] Pulmonary Rehabilitation.

## 2022-05-28 ENCOUNTER — Ambulatory Visit: Payer: Medicaid Other | Attending: Otolaryngology

## 2022-05-28 DIAGNOSIS — G478 Other sleep disorders: Secondary | ICD-10-CM | POA: Insufficient documentation

## 2022-05-28 DIAGNOSIS — G4761 Periodic limb movement disorder: Secondary | ICD-10-CM | POA: Diagnosis not present

## 2022-05-28 DIAGNOSIS — R0683 Snoring: Secondary | ICD-10-CM | POA: Diagnosis present

## 2022-05-29 ENCOUNTER — Telehealth: Payer: Self-pay | Admitting: Cardiology

## 2022-05-29 ENCOUNTER — Encounter: Payer: Medicaid Other | Admitting: *Deleted

## 2022-05-29 DIAGNOSIS — Z79899 Other long term (current) drug therapy: Secondary | ICD-10-CM

## 2022-05-29 DIAGNOSIS — Z951 Presence of aortocoronary bypass graft: Secondary | ICD-10-CM | POA: Diagnosis not present

## 2022-05-29 NOTE — Telephone Encounter (Signed)
Patient came in regarding a medication reaction but does not recall name of medications. States he having issues with legs are sore hard to walk & hands difficult to make a fist. Had sleep study last not legs were going numb

## 2022-05-29 NOTE — Telephone Encounter (Signed)
Attempted to call patient but no answer and unable to leave message as voicemail has not been set up

## 2022-05-29 NOTE — Progress Notes (Signed)
Daily Session Note  Patient Details  Name: Logan Crawford MRN: [id number] Date of Birth: 04/16/1975 Referring Provider:   Flowsheet Row Cardiac Rehab from 05/20/2022 in [facility] Cardiac and Pulmonary Rehab  Referring Provider Dr. Kate Sable, MD       Encounter Date: 05/29/2022  Check In:  Session Check In - 05/29/22 1122       Check-In   Supervising physician immediately available to respond to emergencies See telemetry face sheet for immediately available ER MD    Location [facility]-Cardiac & Pulmonary Rehab    Staff Present Darlyne Russian, RN, Lorin Mercy, MS, ACSM CEP, Exercise Physiologist;Joseph Tessie Fass, [place]    Virtual Visit No    Medication changes reported     No    Fall or balance concerns reported    No    Warm-up and Cool-down Performed on first and last piece of equipment    Resistance Training Performed Yes    VAD Patient? No    PAD/SET Patient? No      Pain Assessment   Currently in Pain? No/denies                Social History   Tobacco Use  Smoking Status Some Days   Packs/day: 0.25   Years: 10.00   Additional pack years: 0.00   Total pack years: 2.50   Types: Cigarettes   Passive exposure: Past  Smokeless Tobacco Never  Tobacco Comments   Smokes 1-2 cigarettes every 2-3 days-05/06/2022    Goals Met:  Independence with exercise equipment Exercise tolerated well No report of concerns or symptoms today Strength training completed today  Goals Unmet:  Not Applicable  Comments: Pt able to follow exercise prescription today without complaint.  Will continue to monitor for progression.    Dr. Emily Filbert is Medical Director for [place].  Dr. Ottie Glazier is Medical Director for [facility] Pulmonary Rehabilitation.

## 2022-05-30 NOTE — Telephone Encounter (Signed)
Could not reach patient. Unable to leave message as voicemail has not been set up

## 2022-06-03 ENCOUNTER — Encounter: Payer: Medicaid Other | Admitting: *Deleted

## 2022-06-03 DIAGNOSIS — Z951 Presence of aortocoronary bypass graft: Secondary | ICD-10-CM | POA: Diagnosis not present

## 2022-06-03 NOTE — Progress Notes (Signed)
Daily Session Note  Patient Details  Name: Logan Crawford MRN: [id number] Date of Birth: 1975-06-05 Referring Provider:   Flowsheet Row Cardiac Rehab from 05/20/2022 in [facility] Cardiac and Pulmonary Rehab  Referring Provider Dr. Debbe Odea, MD       Encounter Date: 06/03/2022  Check In:  Session Check In - 06/03/22 1138       Check-In   Supervising physician immediately available to respond to emergencies See telemetry face sheet for immediately available ER MD    Location [facility]-Cardiac & Pulmonary Rehab    Staff Present Cyndia Diver, RN, BSN, Clyde Canterbury MS, RDN, LDN;Jessica Juanetta Gosling, MA, RCEP, CCRP, CCET    Virtual Visit No    Medication changes reported     No    Fall or balance concerns reported    No    Tobacco Cessation No Change    Warm-up and Cool-down Performed on first and last piece of equipment    Resistance Training Performed Yes    VAD Patient? No    PAD/SET Patient? No      Pain Assessment   Currently in Pain? No/denies                Social History   Tobacco Use  Smoking Status Some Days   Packs/day: 0.25   Years: 10.00   Additional pack years: 0.00   Total pack years: 2.50   Types: Cigarettes   Passive exposure: Past  Smokeless Tobacco Never  Tobacco Comments   Smokes 1-2 cigarettes every 2-3 days-05/06/2022    Goals Met:  Independence with exercise equipment Exercise tolerated well No report of concerns or symptoms today  Goals Unmet:  Not Applicable  Comments: Pt able to follow exercise prescription today without complaint.  Will continue to monitor for progression.    Dr. Bethann Punches is Medical Director for [facility] Cardiac Rehabilitation.  Dr. Vida Rigger is Medical Director for [facility] Pulmonary Rehabilitation.

## 2022-06-03 NOTE — Telephone Encounter (Signed)
Pt made aware of NP's recommendations and verbalized understanding. Lab work order placed.   Logan Dibble, NP  You1 minute ago (1:46 PM)    [name], I do not believe any of the medications we have you on could be causing your symptoms. I see that when you were in the hospital they started you on colchicine. Are you still taking this? Your labs looked at discharge, but lets have you come in and check a CMET and CBC on  you to make sure there is nothing else going on. Best, Victorino Dike

## 2022-06-03 NOTE — Telephone Encounter (Signed)
Spoke to pt. He reported for the last month he's been experiencing knee and leg pain that radiates down to his feet. He reported the pain is so progressive at times, it's difficult to walk. Pt also report he is unable to make a fist due to pain in his hand. He denies swelling or any other symptoms but stated he feels symptoms could be caused by one of his medications.   Will forward to MD for review.

## 2022-06-03 NOTE — Addendum Note (Signed)
Addended by: Parke Poisson on: 06/03/2022 01:50 PM   Modules accepted: Orders

## 2022-06-05 ENCOUNTER — Encounter: Payer: Medicaid Other | Admitting: *Deleted

## 2022-06-05 ENCOUNTER — Other Ambulatory Visit
Admission: RE | Admit: 2022-06-05 | Discharge: 2022-06-05 | Disposition: A | Discharge status: Home / Self Care | Payer: Medicaid Other | Source: Ambulatory Visit | Attending: Cardiology | Admitting: Cardiology

## 2022-06-05 DIAGNOSIS — Z951 Presence of aortocoronary bypass graft: Secondary | ICD-10-CM | POA: Diagnosis not present

## 2022-06-05 DIAGNOSIS — Z79899 Other long term (current) drug therapy: Secondary | ICD-10-CM | POA: Insufficient documentation

## 2022-06-05 LAB — COMPREHENSIVE METABOLIC PANEL WITH GFR
ALT: 16 U/L (ref 0–44)
AST: 24 U/L (ref 15–41)
Albumin: 3.8 g/dL (ref 3.5–5.0)
Alkaline Phosphatase: 90 U/L (ref 38–126)
Anion gap: 9 (ref 5–15)
BUN: 18 mg/dL (ref 6–20)
CO2: 20 mmol/L — ABNORMAL LOW (ref 22–32)
Calcium: 8.7 mg/dL — ABNORMAL LOW (ref 8.9–10.3)
Chloride: 103 mmol/L (ref 98–111)
Creatinine, Ser: 1.47 mg/dL — ABNORMAL HIGH (ref 0.61–1.24)
GFR, Estimated: 58 mL/min — ABNORMAL LOW
Glucose, Bld: 97 mg/dL (ref 70–99)
Potassium: 3.8 mmol/L (ref 3.5–5.1)
Sodium: 132 mmol/L — ABNORMAL LOW (ref 135–145)
Total Bilirubin: 0.6 mg/dL (ref 0.3–1.2)
Total Protein: 7.6 g/dL (ref 6.5–8.1)

## 2022-06-05 LAB — CBC
HCT: 45 % (ref 39.0–52.0)
Hemoglobin: 14.7 g/dL (ref 13.0–17.0)
MCH: 26 pg (ref 26.0–34.0)
MCHC: 32.7 g/dL (ref 30.0–36.0)
MCV: 79.6 fL — ABNORMAL LOW (ref 80.0–100.0)
Platelets: 425 K/uL — ABNORMAL HIGH (ref 150–400)
RBC: 5.65 MIL/uL (ref 4.22–5.81)
RDW: 15.9 % — ABNORMAL HIGH (ref 11.5–15.5)
WBC: 8 K/uL (ref 4.0–10.5)
nRBC: 0 % (ref 0.0–0.2)

## 2022-06-05 NOTE — Progress Notes (Signed)
Daily Session Note  Patient Details  Name: Logan Crawford MRN: [id number] Date of Birth: 1975-08-07 Referring Provider:   Flowsheet Row Cardiac Rehab from 05/20/2022 in [facility] Cardiac and Pulmonary Rehab  Referring Provider Dr. Debbe Odea, MD       Encounter Date: 06/05/2022  Check In:  Session Check In - 06/05/22 1121       Check-In   Supervising physician immediately available to respond to emergencies See telemetry face sheet for immediately available ER MD    Location [facility]-Cardiac & Pulmonary Rehab    Staff Present Lanny Hurst, RN, ADN;Meredith Jewel Baize, RN BSN;Jessica Juanetta Gosling, MA, RCEP, CCRP, CCET;Joseph Francis, [place]    Virtual Visit No    Medication changes reported     No    Fall or balance concerns reported    No    Warm-up and Cool-down Performed on first and last piece of equipment    Resistance Training Performed Yes    VAD Patient? No    PAD/SET Patient? No      Pain Assessment   Currently in Pain? No/denies                Social History   Tobacco Use  Smoking Status Some Days   Packs/day: 0.25   Years: 10.00   Additional pack years: 0.00   Total pack years: 2.50   Types: Cigarettes   Passive exposure: Past  Smokeless Tobacco Never  Tobacco Comments   Smokes 1-2 cigarettes every 2-3 days-05/06/2022    Goals Met:  Independence with exercise equipment Exercise tolerated well No report of concerns or symptoms today Strength training completed today  Goals Unmet:  Not Applicable  Comments: Pt able to follow exercise prescription today without complaint.  Will continue to monitor for progression.    Dr. Bethann Punches is Medical Director for [facility] Cardiac Rehabilitation.  Dr. Vida Rigger is Medical Director for [facility] Pulmonary Rehabilitation.

## 2022-06-10 ENCOUNTER — Ambulatory Visit: Payer: Medicaid Other | Attending: Student in an Organized Health Care Education/Training Program

## 2022-06-10 ENCOUNTER — Telehealth (HOSPITAL_BASED_OUTPATIENT_CLINIC_OR_DEPARTMENT_OTHER): Payer: Medicaid Other | Admitting: Pulmonary Disease

## 2022-06-10 ENCOUNTER — Encounter: Payer: Medicaid Other | Admitting: *Deleted

## 2022-06-10 DIAGNOSIS — Z951 Presence of aortocoronary bypass graft: Secondary | ICD-10-CM

## 2022-06-10 DIAGNOSIS — G4733 Obstructive sleep apnea (adult) (pediatric): Secondary | ICD-10-CM

## 2022-06-10 NOTE — Progress Notes (Signed)
Daily Session Note  Patient Details  Name: Logan Crawford MRN: [id number] Date of Birth: 09/24/1975 Referring Provider:   Flowsheet Row Cardiac Rehab from 05/20/2022 in [facility] Cardiac and Pulmonary Rehab  Referring Provider Dr. Debbe Odea, MD       Encounter Date: 06/10/2022  Check In:  Session Check In - 06/10/22 1137       Check-In   Supervising physician immediately available to respond to emergencies See telemetry face sheet for immediately available ER MD    Location [facility]-Cardiac & Pulmonary Rehab    Staff Present Cyndia Diver, RN, BSN, Damita Dunnings, MA, RCEP, CCRP, CCET;Melissa Guyton MS, RDN, LDN;Meredith Jewel Baize, RN BSN    Virtual Visit No    Medication changes reported     No    Fall or balance concerns reported    No    Tobacco Cessation No Change    Warm-up and Cool-down Performed on first and last piece of equipment    Resistance Training Performed Yes    VAD Patient? No    PAD/SET Patient? No      Pain Assessment   Currently in Pain? No/denies                Social History   Tobacco Use  Smoking Status Some Days   Packs/day: 0.25   Years: 10.00   Additional pack years: 0.00   Total pack years: 2.50   Types: Cigarettes   Passive exposure: Past  Smokeless Tobacco Never  Tobacco Comments   Smokes 1-2 cigarettes every 2-3 days-05/06/2022    Goals Met:  Independence with exercise equipment Exercise tolerated well No report of concerns or symptoms today  Goals Unmet:  Not Applicable  Comments: Pt able to follow exercise prescription today without complaint.  Will continue to monitor for progression.    Dr. Bethann Crawford is Medical Director for [facility] Cardiac Rehabilitation.  Dr. Vida Rigger is Medical Director for [facility] Pulmonary Rehabilitation.

## 2022-06-10 NOTE — Telephone Encounter (Signed)
Sleep time of only 30 mins Inadequate study Please repeat or consider home testing

## 2022-06-11 ENCOUNTER — Encounter: Payer: Self-pay | Admitting: *Deleted

## 2022-06-11 DIAGNOSIS — Z951 Presence of aortocoronary bypass graft: Secondary | ICD-10-CM

## 2022-06-11 NOTE — Telephone Encounter (Signed)
Will await Dr. Doreene Adas response/recommendations.

## 2022-06-11 NOTE — Progress Notes (Signed)
Cardiac Individual Treatment Plan  Patient Details  Name: Logan Crawford MRN: [id number] Date of Birth: [birth year] Referring Provider:   Flowsheet Row Cardiac Rehab from 05/20/2022 in [facility] Cardiac and Pulmonary Rehab  Referring Provider Dr. Kate Sable, MD       Initial Encounter Date:  Flowsheet Row Cardiac Rehab from 05/20/2022 in [facility] Cardiac and Pulmonary Rehab  Date 05/20/22       Visit Diagnosis: S/P CABG x 3  Patient's Home Medications on Admission:  Current Outpatient Medications:    aspirin EC 81 MG tablet, Take 1 tablet (81 mg total) by mouth daily. Swallow whole., Disp: 90 tablet, Rfl: 3   budesonide-formoterol (SYMBICORT) 80-4.5 MCG/ACT inhaler, Inhale 2 puffs into the lungs every 4 (four) hours as needed., Disp: 3 each, Rfl: 0   clopidogrel (PLAVIX) 75 MG tablet, Take 1 tablet (75 mg total) by mouth daily., Disp: 90 tablet, Rfl: 3   colchicine 0.6 MG tablet, Take 1 tablet (0.6 mg total) by mouth 2 (two) times daily., Disp: 60 tablet, Rfl: 1   dextromethorphan-guaiFENesin (MUCINEX DM) 30-600 MG 12hr tablet, Take 1 tablet by mouth 2 (two) times daily as needed for cough., Disp: 30 tablet, Rfl: 0   empagliflozin (JARDIANCE) 10 MG TABS tablet, Take 10 mg by mouth daily., Disp: , Rfl:    famotidine (PEPCID) 20 MG tablet, Take 1 tablet (20 mg total) by mouth 2 (two) times daily., Disp: 60 tablet, Rfl: 3   furosemide (LASIX) 40 MG tablet, Take 1 tablet (40 mg total) by mouth daily., Disp: 90 tablet, Rfl: 3   gabapentin (NEURONTIN) 100 MG capsule, Take 1 capsule (100 mg total) by mouth 2 (two) times daily., Disp: 60 capsule, Rfl: 2   losartan (COZAAR) 50 MG tablet, Take 1 tablet (50 mg total) by mouth daily., Disp: 90 tablet, Rfl: 3   metoprolol succinate (TOPROL-XL) 25 MG 24 hr tablet, Take 2 tablets (50 mg total) by mouth daily. Stop taking metoprolol if you continue to use cocaine, Disp: 180 tablet, Rfl: 3   nicotine (NICODERM CQ - DOSED IN MG/24 HOURS) 21 mg/24hr  patch, Place 1 patch (21 mg total) onto the skin daily., Disp: 28 patch, Rfl: 0   nitroGLYCERIN (NITROSTAT) 0.4 MG SL tablet, Place 1 tablet (0.4 mg total) under the tongue every 5 (five) minutes as needed for chest pain (if chest pain not resolved after 3 doses, please call 911)., Disp: 25 tablet, Rfl: 2   pantoprazole (PROTONIX) 40 MG tablet, Take 1 tablet (40 mg total) by mouth 2 (two) times daily., Disp: 60 tablet, Rfl: 1   rosuvastatin (CRESTOR) 40 MG tablet, Take 1 tablet (40 mg total) by mouth daily., Disp: 90 tablet, Rfl: 3   spironolactone (ALDACTONE) 25 MG tablet, Take 1 tablet (25 mg total) by mouth daily., Disp: 90 tablet, Rfl: 3  Past Medical History: Past Medical History:  Diagnosis Date   CAD (coronary artery disease)    a. 11/2019 NSTEMI/PCI: LM min irregs, LAD 58m (2.75x22 Resolute Onyx DES), 40d, D2 min irregs, LCX/LPAV min irregs, RCA 60ost, 40p/m, EF 45-50%; b. 11/2020 MV: EF 43%, small area of apical ischemia ->? over processing-->low risk.   Hyperlipidemia    Hypertension    Ischemic cardiomyopathy    a. 11/2019 LV Gram: EF 45-50%; b. 12/2019 Echo: EF 50% w/ mod mid-apical anteroseptal HK, GrI DD, nl RV fxn, mild Ao sclerosis.   Tobacco use 11/25/2019    Tobacco Use: Social History   Tobacco Use  Smoking Status  Some Days   Packs/day: 0.25   Years: 10.00   Additional pack years: 0.00   Total pack years: 2.50   Types: Cigarettes   Passive exposure: Past  Smokeless Tobacco Never  Tobacco Comments   Smokes 1-2 cigarettes every 2-3 days-05/06/2022    Labs: Review Flowsheet  More data exists      Latest Ref Rng & Units 02/21/2022 02/22/2022 02/23/2022 04/14/2022 05/02/2022  Labs for ITP Cardiac and Pulmonary Rehab  Cholestrol 0 - 200 mg/dL - - - - 138   LDL (calc) 0 - 99 mg/dL - - - - 46   HDL-C >40 mg/dL - - - - 26   Trlycerides <150 mg/dL - - - - 331   Hemoglobin A1c 4.8 - 5.6 % - - - 5.2  -  O2 Saturation % 62.6  44.6  52.6  44.8  - -     Exercise  Target Goals: Exercise Program Goal: Individual exercise prescription set using results from initial 6 min walk test and THRR while considering  patient's activity barriers and safety.   Exercise Prescription Goal: Initial exercise prescription builds to 30-45 minutes a day of aerobic activity, 2-3 days per week.  Home exercise guidelines will be given to patient during program as part of exercise prescription that the participant will acknowledge.   Education: Aerobic Exercise: - Group verbal and visual presentation on the components of exercise prescription. Introduces F.I.T.T principle from ACSM for exercise prescriptions.  Reviews F.I.T.T. principles of aerobic exercise including progression. Written material given at graduation. Flowsheet Row Cardiac Rehab from 05/20/2022 in [facility] Cardiac and Pulmonary Rehab  Education need identified 05/20/22       Education: Resistance Exercise: - Group verbal and visual presentation on the components of exercise prescription. Introduces F.I.T.T principle from ACSM for exercise prescriptions  Reviews F.I.T.T. principles of resistance exercise including progression. Written material given at graduation.    Education: Exercise & Equipment Safety: - Individual verbal instruction and demonstration of equipment use and safety with use of the equipment. Flowsheet Row Cardiac Rehab from 05/20/2022 in [facility] Cardiac and Pulmonary Rehab  Date 05/20/22  Educator NT  Instruction Review Code 1- Verbalizes Understanding       Education: Exercise Physiology & General Exercise Guidelines: - Group verbal and written instruction with models to review the exercise physiology of the cardiovascular system and associated critical values. Provides general exercise guidelines with specific guidelines to those with heart or lung disease.    Education: Flexibility, Balance, Mind/Body Relaxation: - Group verbal and visual presentation with interactive activity on the  components of exercise prescription. Introduces F.I.T.T principle from ACSM for exercise prescriptions. Reviews F.I.T.T. principles of flexibility and balance exercise training including progression. Also discusses the mind body connection.  Reviews various relaxation techniques to help reduce and manage stress (i.e. Deep breathing, progressive muscle relaxation, and visualization). Balance handout provided to take home. Written material given at graduation.   Activity Barriers & Risk Stratification:  Activity Barriers & Cardiac Risk Stratification - 05/20/22 1429       Activity Barriers & Cardiac Risk Stratification   Activity Barriers Back Problems;Shortness of Breath   Occasional pain in knees, feet, and hands   Cardiac Risk Stratification High             6 Minute Walk:  6 Minute Walk     Row Name 05/20/22 1425         6 Minute Walk   Phase Initial     Distance  1215 feet     Walk Time 6 minutes     # of Rest Breaks 0     MPH 2.3     METS 3.93     RPE 10     Perceived Dyspnea  1     VO2 Peak 13.74     Symptoms No     Resting HR 95 bpm     Resting BP 124/66     Resting Oxygen Saturation  98 %     Exercise Oxygen Saturation  during 6 min walk 97 %     Max Ex. HR 111 bpm     Max Ex. BP 132/74     2 Minute Post BP 116/72              Oxygen Initial Assessment:   Oxygen Re-Evaluation:   Oxygen Discharge (Final Oxygen Re-Evaluation):   Initial Exercise Prescription:  Initial Exercise Prescription - 05/20/22 1400       Date of Initial Exercise RX and Referring Provider   Date 05/20/22    Referring Provider Dr. Debbe Odea, MD      Oxygen   Maintain Oxygen Saturation 88% or higher      Treadmill   MPH 2    Grade 1    Minutes 15    METs 2.81      NuStep   Level 3    SPM 80    Minutes 15    METs 3.93      Recumbant Elliptical   Level 1.5    RPM 50    Minutes 15    METs 3.93      Prescription Details   Frequency (times per week) 2     Duration Progress to 30 minutes of continuous aerobic without signs/symptoms of physical distress      Intensity   THRR 40-80% of Max Heartrate 125-156    Ratings of Perceived Exertion 11-13    Perceived Dyspnea 0-4      Progression   Progression Continue to progress workloads to maintain intensity without signs/symptoms of physical distress.      Resistance Training   Training Prescription Yes    Weight 4 lb    Reps 10-15             Perform Capillary Blood Glucose checks as needed.  Exercise Prescription Changes:   Exercise Prescription Changes     Row Name 05/20/22 1400 06/05/22 1400           Response to Exercise   Blood Pressure (Admit) 124/66 112/66      Blood Pressure (Exercise) 132/74 144/78      Blood Pressure (Exit) 116/72 98/56      Heart Rate (Admit) 95 bpm 93 bpm      Heart Rate (Exercise) 111 bpm 113 bpm      Heart Rate (Exit) 98 bpm 97 bpm      Oxygen Saturation (Admit) 98 % --      Oxygen Saturation (Exercise) 97 % --      Rating of Perceived Exertion (Exercise) 10 13      Perceived Dyspnea (Exercise) 1 --      Symptoms None none      Comments Results First two days of exercise      Duration -- Continue with 30 min of aerobic exercise without signs/symptoms of physical distress.      Intensity -- THRR unchanged        Progression   Progression --  Continue to progress workloads to maintain intensity without signs/symptoms of physical distress.      Average METs -- 2.79        Resistance Training   Training Prescription -- Yes      Weight -- 4 lb      Reps -- 10-15        Interval Training   Interval Training -- No        Treadmill   MPH -- 2      Grade -- 1      Minutes -- 15      METs -- 2.81        NuStep   Level -- 3      Minutes -- 15      METs -- 2.7        Oxygen   Maintain Oxygen Saturation -- 88% or higher               Exercise Comments:   Exercise Comments     Row Name 05/27/22 1115            Exercise Comments First full day of exercise!  Patient was oriented to gym and equipment including functions, settings, policies, and procedures.  Patient's individual exercise prescription and treatment plan were reviewed.  All starting workloads were established based on the results of the 6 minute walk test done at initial orientation visit.  The plan for exercise progression was also introduced and progression will be customized based on patient's performance and goals.                Exercise Goals and Review:   Exercise Goals     Row Name 05/20/22 1430             Exercise Goals   Increase Physical Activity Yes       Intervention Provide advice, education, support and counseling about physical activity/exercise needs.;Develop an individualized exercise prescription for aerobic and resistive training based on initial evaluation findings, risk stratification, comorbidities and participant's personal goals.       Expected Outcomes Short Term: Attend rehab on a regular basis to increase amount of physical activity.;Long Term: Exercising regularly at least 3-5 days a week.;Long Term: Add in home exercise to make exercise part of routine and to increase amount of physical activity.       Increase Strength and Stamina Yes       Intervention Provide advice, education, support and counseling about physical activity/exercise needs.;Develop an individualized exercise prescription for aerobic and resistive training based on initial evaluation findings, risk stratification, comorbidities and participant's personal goals.       Expected Outcomes Short Term: Increase workloads from initial exercise prescription for resistance, speed, and METs.;Short Term: Perform resistance training exercises routinely during rehab and add in resistance training at home;Long Term: Improve cardiorespiratory fitness, muscular endurance and strength as measured by increased METs and functional capacity ( )       Able  to understand and use rate of perceived exertion (RPE) scale Yes       Intervention Provide education and explanation on how to use RPE scale       Expected Outcomes Short Term: Able to use RPE daily in rehab to express subjective intensity level;Long Term:  Able to use RPE to guide intensity level when exercising independently       Able to understand and use Dyspnea scale Yes       Intervention Provide education and explanation on how  to use Dyspnea scale       Expected Outcomes Short Term: Able to use Dyspnea scale daily in rehab to express subjective sense of shortness of breath during exertion;Long Term: Able to use Dyspnea scale to guide intensity level when exercising independently       Knowledge and understanding of Target Heart Rate Range (THRR) Yes       Intervention Provide education and explanation of THRR including how the numbers were predicted and where they are located for reference       Expected Outcomes Short Term: Able to state/look up THRR;Long Term: Able to use THRR to govern intensity when exercising independently;Short Term: Able to use daily as guideline for intensity in rehab       Able to check pulse independently Yes       Intervention Provide education and demonstration on how to check pulse in carotid and radial arteries.;Review the importance of being able to check your own pulse for safety during independent exercise       Expected Outcomes Short Term: Able to explain why pulse checking is important during independent exercise;Long Term: Able to check pulse independently and accurately       Understanding of Exercise Prescription Yes       Intervention Provide education, explanation, and written materials on patient's individual exercise prescription       Expected Outcomes Short Term: Able to explain program exercise prescription;Long Term: Able to explain home exercise prescription to exercise independently                Exercise Goals Re-Evaluation :   Exercise Goals Re-Evaluation     Row Name 05/27/22 1115 06/05/22 1410           Exercise Goal Re-Evaluation   Exercise Goals Review Increase Physical Activity;Able to understand and use rate of perceived exertion (RPE) scale;Knowledge and understanding of Target Heart Rate Range (THRR);Understanding of Exercise Prescription;Increase Strength and Stamina;Able to check pulse independently Increase Physical Activity;Increase Strength and Stamina;Understanding of Exercise Prescription      Comments Reviewed RPE scale, THR and program prescription with pt today.  Pt voiced understanding and was given a copy of goals to take home. Cletis Athens is off to a good start in rehab. He did well with the treadmill at a speed of 2 mph with an incline of 1%. He also worked at level 3 on the T4 nustep during his first two sessions. He has done well with 4 lb hand weights for resistance training as well. We will continue to monitor his progress in the program.      Expected Outcomes Short: Use RPE daily to regulate intensity.  Long: Follow program prescription in THR. Short: Continue to follow current exercise prescription and progressively increase workloads. Long: Continue to improve strength and stamina.               Discharge Exercise Prescription (Final Exercise Prescription Changes):  Exercise Prescription Changes - 06/05/22 1400       Response to Exercise   Blood Pressure (Admit) 112/66    Blood Pressure (Exercise) 144/78    Blood Pressure (Exit) 98/56    Heart Rate (Admit) 93 bpm    Heart Rate (Exercise) 113 bpm    Heart Rate (Exit) 97 bpm    Rating of Perceived Exertion (Exercise) 13    Symptoms none    Comments First two days of exercise    Duration Continue with 30 min of aerobic exercise without signs/symptoms of  physical distress.    Intensity THRR unchanged      Progression   Progression Continue to progress workloads to maintain intensity without signs/symptoms of physical distress.     Average METs 2.79      Resistance Training   Training Prescription Yes    Weight 4 lb    Reps 10-15      Interval Training   Interval Training No      Treadmill   MPH 2    Grade 1    Minutes 15    METs 2.81      NuStep   Level 3    Minutes 15    METs 2.7      Oxygen   Maintain Oxygen Saturation 88% or higher             Nutrition:  Target Goals: Understanding of nutrition guidelines, daily intake of sodium 1500mg , cholesterol 200mg , calories 30% from fat and 7% or less from saturated fats, daily to have 5 or more servings of fruits and vegetables.  Education: All About Nutrition: -Group instruction provided by verbal, written material, interactive activities, discussions, models, and posters to present general guidelines for heart healthy nutrition including fat, fiber, MyPlate, the role of sodium in heart healthy nutrition, utilization of the nutrition label, and utilization of this knowledge for meal planning. Follow up email sent as well. Written material given at graduation. Flowsheet Row Cardiac Rehab from 05/20/2022 in [facility] Cardiac and Pulmonary Rehab  Education need identified 05/20/22       Biometrics:  Pre Biometrics - 05/20/22 1432       Pre Biometrics   Height 6' (1.829 m)    Weight 226 lb 12.8 oz (102.9 kg)    Waist Circumference 40.5 inches    Hip Circumference 43 inches    Waist to Hip Ratio 0.94 %    BMI (Calculated) 30.75    Single Leg Stand 30 seconds              Nutrition Therapy Plan and Nutrition Goals:  Nutrition Therapy & Goals - 05/20/22 1131       Nutrition Therapy   Diet Heart healthy, low Na    Drug/Food Interactions Statins/Certain Fruits    Protein (specify units) 65g   CKD stg 3: 0.6g/kg/d   Fiber 30 grams    Whole Grain Foods 3 servings    Saturated Fats 16 max. grams    Fruits and Vegetables 8 servings/day    Sodium 2 grams      Personal Nutrition Goals   Nutrition Goal ST: review handouts, focus on lean  meats, limit creamy or cheese sauces or add ons, and add fruit and vegetable servings to meals when going out LT: limit Na <2g, limit saturated fat <16g/day, aim for at least 30g of fiber/day    Comments 47 y.o. M admitted to cardiac rehab s/p CABG x 3. PMHx includes HTN, NSTEMI (2021 and 2023), CAD, chronic diastolic CHF, GERD, Stg 3a CKD, HLD, polysubstance abuse, former tobacco use, hypercalcemia. Medications reviewed symbicort, jardiance, pepcid, furosemide, nicotine, pantoprazole, rosuvastatin. B: sausage egg and cheese on toast or raisin bran L: chik-fil-a or grilled chicken or ribeye steak sandwich - he has been trying to limit [employer] burger D: [place] food 2x/week, sushi, salmon S; chips or candy bars or pickles Drinks: water, gatorade (grape - regular) 2-3x/day and sweet tea 3-4x/day. He reports not cooking at home most of the time and would like to focus on modifications eating out  as he does not want to cook - discussed some options to put together at home too like rotisserie chicken, microwave grains like brown rice, and frozen vegetables. Also discussed general heart healthy eating and considerations while eating out.      Intervention Plan   Intervention Prescribe, educate and counsel regarding individualized specific dietary modifications aiming towards targeted core components such as weight, hypertension, lipid management, diabetes, heart failure and other comorbidities.;Nutrition handout(s) given to patient.    Expected Outcomes Short Term Goal: Understand basic principles of dietary content, such as calories, fat, sodium, cholesterol and nutrients.;Short Term Goal: A plan has been developed with personal nutrition goals set during dietitian appointment.;Long Term Goal: Adherence to prescribed nutrition plan.             Nutrition Assessments:  MEDIFICTS Score Key: ?70 Need to make dietary changes  40-70 Heart Healthy Diet ? 40 Therapeutic Level Cholesterol Diet  Flowsheet  Row Cardiac Rehab from 05/20/2022 in [facility] Cardiac and Pulmonary Rehab  Picture Your Plate Total Score on Admission 50      Picture Your Plate Scores: <72 Unhealthy dietary pattern with much room for improvement. 41-50 Dietary pattern unlikely to meet recommendations for good health and room for improvement. 51-60 More healthful dietary pattern, with some room for improvement.  >60 Healthy dietary pattern, although there may be some specific behaviors that could be improved.    Nutrition Goals Re-Evaluation:   Nutrition Goals Discharge (Final Nutrition Goals Re-Evaluation):   Psychosocial: Target Goals: Acknowledge presence or absence of significant depression and/or stress, maximize coping skills, provide positive support system. Participant is able to verbalize types and ability to use techniques and skills needed for reducing stress and depression.   Education: Stress, Anxiety, and Depression - Group verbal and visual presentation to define topics covered.  Reviews how body is impacted by stress, anxiety, and depression.  Also discusses healthy ways to reduce stress and to treat/manage anxiety and depression.  Written material given at graduation.   Education: Sleep Hygiene -Provides group verbal and written instruction about how sleep can affect your health.  Define sleep hygiene, discuss sleep cycles and impact of sleep habits. Review good sleep hygiene tips.    Initial Review & Psychosocial Screening:  Initial Psych Review & Screening - 05/13/22 1408       Initial Review   Current issues with Current Sleep Concerns      Family Dynamics   Good Support System? Yes      Barriers   Psychosocial barriers to participate in program There are no identifiable barriers or psychosocial needs.;The patient should benefit from training in stress management and relaxation.      Screening Interventions   Interventions Encouraged to exercise;Provide feedback about the scores to  participant;To provide support and resources with identified psychosocial needs    Expected Outcomes Short Term goal: Utilizing psychosocial counselor, staff and physician to assist with identification of specific Stressors or current issues interfering with healing process. Setting desired goal for each stressor or current issue identified.;Long Term Goal: Stressors or current issues are controlled or eliminated.;Short Term goal: Identification and review with participant of any Quality of Life or Depression concerns found by scoring the questionnaire.;Long Term goal: The participant improves quality of Life and PHQ9 Scores as seen by post scores and/or verbalization of changes             Quality of Life Scores:   Quality of Life - 05/20/22 1411  Quality of Life   Select Quality of Life      Quality of Life Scores   Health/Function Pre 14.17 %    Socioeconomic Pre 15.07 %    Psych/Spiritual Pre 17.36 %    Family Pre 18.3 %    GLOBAL Pre 15.62 %            Scores of 19 and below usually indicate a poorer quality of life in these areas.  A difference of  2-3 points is a clinically meaningful difference.  A difference of 2-3 points in the total score of the Quality of Life Index has been associated with significant improvement in overall quality of life, self-image, physical symptoms, and general health in studies assessing change in quality of life.  PHQ-9: Review Flowsheet       05/20/2022 03/14/2022  Depression screen PHQ 2/9  Decreased Interest 0 0  Down, Depressed, Hopeless 0 0  PHQ - 2 Score 0 0  Altered sleeping 0 -  Tired, decreased energy 2 -  Change in appetite 0 -  Feeling bad or failure about yourself  0 -  Trouble concentrating 0 -  Moving slowly or fidgety/restless 0 -  Suicidal thoughts 0 -  PHQ-9 Score 2 -  Difficult doing work/chores Not difficult at all -   Interpretation of Total Score  Total Score Depression Severity:  1-4 = Minimal depression,  5-9 = Mild depression, 10-14 = Moderate depression, 15-19 = Moderately severe depression, 20-27 = Severe depression   Psychosocial Evaluation and Intervention:  Psychosocial Evaluation - 05/13/22 1412       Psychosocial Evaluation & Interventions   Interventions Encouraged to exercise with the program and follow exercise prescription    Comments Codylee 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 He is wearing a nicotine patch on and off and is trying to quit completly. He states he doesn't have really any stress concerns besides when he is in pain it makes things more difficult. He does report sleep concerns, that it is hard to stay asleep. He also needs a sleep study to evaluate for sleep apnea. He really enjoys playing pool and is back to playing since his CABG. He wants to work on exercising more and understanding how to live a heart healthy lifestyle.    Expected Outcomes Short: attend cardiac rehab for education and exercise. Long: develop and maintain positive self care habits.    Continue Psychosocial Services  Follow up required by staff             Psychosocial Re-Evaluation:   Psychosocial Discharge (Final Psychosocial Re-Evaluation):   Vocational Rehabilitation: Provide vocational rehab assistance to qualifying candidates.   Vocational Rehab Evaluation & Intervention:  Vocational Rehab - 05/13/22 1408       Initial Vocational Rehab Evaluation & Intervention   Assessment shows need for Vocational Rehabilitation No             Education: Education Goals: Education classes will be provided on a variety of topics geared toward better understanding of heart health and risk factor modification. Participant will state understanding/return demonstration of topics presented as noted by education test scores.  Learning Barriers/Preferences:  Learning Barriers/Preferences - 05/13/22 1408       Learning  Barriers/Preferences   Learning Barriers None    Learning Preferences None             General Cardiac Education Topics:  AED/CPR: - Group  verbal and written instruction with the use of models to demonstrate the basic use of the AED with the basic ABC's of resuscitation.   Anatomy and Cardiac Procedures: - Group verbal and visual presentation and models provide information about basic cardiac anatomy and function. Reviews the testing methods done to diagnose heart disease and the outcomes of the test results. Describes the treatment choices: Medical Management, Angioplasty, or Coronary Bypass Surgery for treating various heart conditions including Myocardial Infarction, Angina, Valve Disease, and Cardiac Arrhythmias.  Written material given at graduation. Flowsheet Row Cardiac Rehab from 05/20/2022 in [facility] Cardiac and Pulmonary Rehab  Education need identified 05/20/22       Medication Safety: - Group verbal and visual instruction to review commonly prescribed medications for heart and lung disease. Reviews the medication, class of the drug, and side effects. Includes the steps to properly store meds and maintain the prescription regimen.  Written material given at graduation.   Intimacy: - Group verbal instruction through game format to discuss how heart and lung disease can affect sexual intimacy. Written material given at graduation..   Know Your Numbers and Heart Failure: - Group verbal and visual instruction to discuss disease risk factors for cardiac and pulmonary disease and treatment options.  Reviews associated critical values for Overweight/Obesity, Hypertension, Cholesterol, and Diabetes.  Discusses basics of heart failure: signs/symptoms and treatments.  Introduces Heart Failure Zone chart for action plan for heart failure.  Written material given at graduation.   Infection Prevention: - Provides verbal and written material to individual with discussion of infection  control including proper hand washing and proper equipment cleaning during exercise session. Flowsheet Row Cardiac Rehab from 05/20/2022 in [facility] Cardiac and Pulmonary Rehab  Date 05/20/22  Educator NT  Instruction Review Code 1- Verbalizes Understanding       Falls Prevention: - Provides verbal and written material to individual with discussion of falls prevention and safety. Flowsheet Row Cardiac Rehab from 05/20/2022 in [facility] Cardiac and Pulmonary Rehab  Date 05/20/22  Educator NT  Instruction Review Code 1- Verbalizes Understanding       Other: -Provides group and verbal instruction on various topics (see comments)   Knowledge Questionnaire Score:  Knowledge Questionnaire Score - 05/20/22 1411       Knowledge Questionnaire Score   Pre Score 19/26             Core Components/Risk Factors/Patient Goals at Admission:  Personal Goals and Risk Factors at Admission - 05/13/22 1406       Core Components/Risk Factors/Patient Goals on Admission   Tobacco Cessation Yes   wearing patch   Number of packs per day 0    Expected Outcomes Long Term: Complete abstinence from all tobacco products for at least 12 months from quit date.    Hypertension Yes    Intervention Provide education on lifestyle modifcations including regular physical activity/exercise, weight management, moderate sodium restriction and increased consumption of fresh fruit, vegetables, and low fat dairy, alcohol moderation, and smoking cessation.;Monitor prescription use compliance.    Expected Outcomes Short Term: Continued assessment and intervention until BP is < 140/50mm HG in hypertensive participants. < 130/33mm HG in hypertensive participants with diabetes, heart failure or chronic kidney disease.;Long Term: Maintenance of blood pressure at goal levels.             Education:Diabetes - Individual verbal and written instruction to review signs/symptoms of diabetes, desired ranges of glucose level  fasting, after meals and with exercise. Acknowledge that pre and  post exercise glucose checks will be done for 3 sessions at entry of program.   Core Components/Risk Factors/Patient Goals Review:    Core Components/Risk Factors/Patient Goals at Discharge (Final Review):    ITP Comments:  ITP Comments     Row Name 05/13/22 1416 05/20/22 1402 05/27/22 1115 06/11/22 1156     ITP Comments Initial phone call completed. Diagnosis can be found in [facility] 12/25. EP Orientation scheduled for Tuesday 3/26 at 9:30.  Lonell has recently quit tobacco use within the last 6 months. Intervention for relapse prevention was provided at the initial medical review. He was encouraged to continue to with tobacco cessation and was provided information on relapse prevention. Patient received information about combination therapy, tobacco cessation classes, quit line, and quit smoking apps in case of a relapse. Patient demonstrated understanding of this material.Staff will continue to provide encouragement and follow up with the patient throughout the program. Completed and gym orientation. Initial ITP created and sent for review to Dr. Bethann Punches, Medical Director. First full day of exercise!  Patient was oriented to gym and equipment including functions, settings, policies, and procedures.  Patient's individual exercise prescription and treatment plan were reviewed.  All starting workloads were established based on the results of the 6 minute walk test done at initial orientation visit.  The plan for exercise progression was also introduced and progression will be customized based on patient's performance and goals. 30 day review completed. ITP sent to Dr. Bethann Punches, Medical Director of Cardiac Rehab. Continue with ITP unless changes are made by physician.   Pt still new to program             Comments: 30 day review

## 2022-06-12 ENCOUNTER — Encounter: Payer: Medicaid Other | Admitting: *Deleted

## 2022-06-16 NOTE — Telephone Encounter (Signed)
Patient scheduled for appt 06/17/2022. Results will be discussed with patient during appt.

## 2022-06-17 ENCOUNTER — Telehealth: Payer: Self-pay | Admitting: *Deleted

## 2022-06-17 ENCOUNTER — Ambulatory Visit: Payer: Medicaid Other | Admitting: Student in an Organized Health Care Education/Training Program

## 2022-06-17 ENCOUNTER — Encounter: Payer: Self-pay | Admitting: Student in an Organized Health Care Education/Training Program

## 2022-06-17 ENCOUNTER — Encounter: Payer: Medicaid Other | Admitting: *Deleted

## 2022-06-17 VITALS — BP 110/70 | HR 80 | Temp 97.7°F | Ht 72.0 in | Wt 231.8 lb

## 2022-06-17 DIAGNOSIS — R0683 Snoring: Secondary | ICD-10-CM

## 2022-06-17 DIAGNOSIS — R0602 Shortness of breath: Secondary | ICD-10-CM

## 2022-06-17 NOTE — Progress Notes (Signed)
Assessment & Plan:   #Shortness of breath  He presents for the evaluation of exertional dyspnea in the setting of significant cardiac history as well as history of smoking. His chest CT from December 2023 is notable for paraseptal and centrilobular emphysema consistent with his history of smoking and cocaine use.   Patient was to undergo pulmonary function testing but he hasn't yet scheduled his test. I have encouraged him to do so today. We also discussed optimal Symbicort use. I recommend that he use the Symbicort twice daily for the time being, in addition to as needed, while we await the results of his PFT's. Furthermore, given his weight gain I've asked him to reach out to his cardiology team for this to be addressed. He doesn't have rales nor lower extremity edema, but patient reports abdominal fullness. Again counseled on the importance of complete abstinence of smoking as well as cocaine use.   -PFT's -continue Symbicort, use two puffs twice daily and every 4 hours PRN  #Loud snoring  Patient has a history of snoring and given his heart failure is at increased risk for OSA as well as central sleep apnea. Sleep study obtained was sub-optimal. I would prefer he obtain an in-lab study to assess for central apnea, and will re-order this today. He is willing to try it again.  - Split night study; Future   Return in about 3 months (around 09/16/2022).  I spent 30 minutes caring for this patient today, including preparing to see the patient, obtaining a medical history , reviewing a separately obtained history, performing a medically appropriate examination and/or evaluation, counseling and educating the patient/family/caregiver, ordering medications, tests, or procedures, and documenting clinical information in the electronic health record  Raechel Chute, MD [facility] 06/17/2022 4:30 PM    End of visit medications:  No orders of the defined types were placed  in this encounter.    Current Outpatient Medications:    aspirin EC 81 MG tablet, Take 1 tablet (81 mg total) by mouth daily. Swallow whole., Disp: 90 tablet, Rfl: 3   budesonide-formoterol (SYMBICORT) 80-4.5 MCG/ACT inhaler, Inhale 2 puffs into the lungs every 4 (four) hours as needed., Disp: 3 each, Rfl: 0   clopidogrel (PLAVIX) 75 MG tablet, Take 1 tablet (75 mg total) by mouth daily., Disp: 90 tablet, Rfl: 3   colchicine 0.6 MG tablet, Take 1 tablet (0.6 mg total) by mouth 2 (two) times daily., Disp: 60 tablet, Rfl: 1   dextromethorphan-guaiFENesin (MUCINEX DM) 30-600 MG 12hr tablet, Take 1 tablet by mouth 2 (two) times daily as needed for cough., Disp: 30 tablet, Rfl: 0   empagliflozin (JARDIANCE) 10 MG TABS tablet, Take 10 mg by mouth daily., Disp: , Rfl:    famotidine (PEPCID) 20 MG tablet, Take 1 tablet (20 mg total) by mouth 2 (two) times daily., Disp: 60 tablet, Rfl: 3   furosemide (LASIX) 40 MG tablet, Take 1 tablet (40 mg total) by mouth daily., Disp: 90 tablet, Rfl: 3   gabapentin (NEURONTIN) 100 MG capsule, Take 1 capsule (100 mg total) by mouth 2 (two) times daily., Disp: 60 capsule, Rfl: 2   losartan (COZAAR) 50 MG tablet, Take 1 tablet (50 mg total) by mouth daily., Disp: 90 tablet, Rfl: 3   metoprolol succinate (TOPROL-XL) 25 MG 24 hr tablet, Take 2 tablets (50 mg total) by mouth daily. Stop taking metoprolol if you continue to use cocaine, Disp: 180 tablet, Rfl: 3   nicotine (NICODERM CQ -  DOSED IN MG/24 HOURS) 21 mg/24hr patch, Place 1 patch (21 mg total) onto the skin daily., Disp: 28 patch, Rfl: 0   nitroGLYCERIN (NITROSTAT) 0.4 MG SL tablet, Place 1 tablet (0.4 mg total) under the tongue every 5 (five) minutes as needed for chest pain (if chest pain not resolved after 3 doses, please call 911)., Disp: 25 tablet, Rfl: 2   pantoprazole (PROTONIX) 40 MG tablet, Take 1 tablet (40 mg total) by mouth 2 (two) times daily., Disp: 60 tablet, Rfl: 1   rosuvastatin (CRESTOR) 40 MG  tablet, Take 1 tablet (40 mg total) by mouth daily., Disp: 90 tablet, Rfl: 3   spironolactone (ALDACTONE) 25 MG tablet, Take 1 tablet (25 mg total) by mouth daily., Disp: 90 tablet, Rfl: 3   Subjective:   PATIENT ID: Logan Crawford GENDER: male DOB: 1976-01-28, MRN: [id number]  Chief Complaint  Patient presents with   Follow-up    Review sleep study. SOB with exertion and wheezing.     HPI  The patient is a 47 year old male presenting to clinic for follow up.  Patient was first seen by me in March of 2024. He has yet to have his PFT's. He did have a sub-optimal in lab sleep study (only 30 minutes of sleep time). Patient's symptoms have mostly consisted of episodic dyspnea and wheezing. He's used his Symbicort once a day and was not aware that he can use it up to every 4 hours as needed.   Patient has an extensive cardiac history and started experiencing exertional dyspnea over the past 2 years alongside his cardiac symptoms.  He has been followed closely by cardiology for his CAD as well as advanced heart failure.  Patient initially underwent PCI in November of 2021 which was around the time his symptoms started.  He developed chest pain again in September of 2023 with cardiac cath showing two-vessel disease treated medically.  Readmission in December 2023 again with anginal symptoms at which point he underwent CABG (LIMA to diagonal, SVG to LAD, SVG to RCA).  Following this, he has been seen by our advanced heart failure specialist as well as his general cardiology team. He is maintained on goal-directed medical therapy as well as DAPT (aspirin and clopidogrel).  He reports symptoms consistent with paroxysmal nocturnal dyspnea as well as orthopnea.  He does not have any lower extremity edema.  He does not currently have any chest pain.  He does not have any fevers, chills, night sweats, weight loss, GI, or GU symptoms.  Patient also reports snoring and was previously seen in our clinic in  2022 for sleep medicine evaluation but did not end up getting a sleep study. Sleep study obtained recently was suboptimal. Patient reports having gained weight over the past 2 weeks, and feels it is in his abdomen.  Patient reports a history of childhood asthma that he outgrew (reports having used over-the-counter inhalers when he was a teenager).   Patient has a history of tobacco use, reports quitting following his recent admission.  He previously snorted cocaine and smoked marijuana but does not do that at the moment.  Edible MJ use reported.  Ancillary information including prior medications, full medical/surgical/family/social histories, and PFTs (when available) are listed below and have been reviewed.   Review of Systems  Constitutional:  Negative for chills, fever and weight loss.  Respiratory:  Positive for shortness of breath. Negative for cough, hemoptysis and sputum production.   Skin:  Negative for rash.  Objective:   Vitals:   06/17/22 1524  BP: 110/70  Pulse: 80  Temp: 97.7 F (36.5 C)  TempSrc: Temporal  SpO2: 97%  Weight: 231 lb 12.8 oz (105.1 kg)  Height: 6' (1.829 m)   97% on RA  BMI Readings from Last 3 Encounters:  06/17/22 31.44 kg/m  05/20/22 30.76 kg/m  05/13/22 30.65 kg/m   Wt Readings from Last 3 Encounters:  06/17/22 231 lb 12.8 oz (105.1 kg)  05/20/22 226 lb 12.8 oz (102.9 kg)  05/13/22 226 lb (102.5 kg)    Physical Exam Constitutional:      Appearance: Normal appearance. He is not ill-appearing.  HENT:     Mouth/Throat:     Mouth: Mucous membranes are moist.  Cardiovascular:     Rate and Rhythm: Normal rate and regular rhythm.     Pulses: Normal pulses.     Heart sounds: Normal heart sounds.  Pulmonary:     Effort: Pulmonary effort is normal.     Breath sounds: Normal breath sounds.  Abdominal:     Palpations: Abdomen is soft.  Musculoskeletal:     Right lower leg: No edema.     Left lower leg: No edema.  Neurological:      General: No focal deficit present.     Mental Status: He is alert and oriented to person, place, and time. Mental status is at baseline.       Ancillary Information    Past Medical History:  Diagnosis Date   CAD (coronary artery disease)    a. 11/2019 NSTEMI/PCI: LM min irregs, LAD 81m (2.75x22 Resolute Onyx DES), 40d, D2 min irregs, LCX/LPAV min irregs, RCA 60ost, 40p/m, EF 45-50%; b. 11/2020 MV: EF 43%, small area of apical ischemia ->? over processing-->low risk.   Hyperlipidemia    Hypertension    Ischemic cardiomyopathy    a. 11/2019 LV Gram: EF 45-50%; b. 12/2019 Echo: EF 50% w/ mod mid-apical anteroseptal HK, GrI DD, nl RV fxn, mild Ao sclerosis.   Tobacco use 11/25/2019     Family History  Problem Relation Age of Onset   Coronary artery disease Mother    Arrhythmia Mother        Pacemaker placed in 2021   Heart failure Mother    Coronary artery disease Father      Past Surgical History:  Procedure Laterality Date   CORONARY ARTERY BYPASS GRAFT N/A 02/19/2022   Procedure: CORONARY ARTERY BYPASS GRAFTING (CABG) X THREE, USING ENDOSCOPICALLY HARVESTED RIGHT GREATER SAPHENOUS VEIN  AND LEFT ATRIAL APPENDAGE CLIPPING;  Surgeon: Lyn Hollingshead, MD;  Location: [facility] OR;  Service: Open Heart Surgery;  Laterality: N/A;   CORONARY STENT INTERVENTION N/A 01/24/2020   Procedure: CORONARY STENT INTERVENTION;  Surgeon: Iran Ouch, MD;  Location: [facility] INVASIVE CV LAB;  Service: Cardiovascular;  Laterality: N/A;   CORONARY STENT INTERVENTION N/A 05/20/2021   Procedure: CORONARY STENT INTERVENTION;  Surgeon: Iran Ouch, MD;  Location: [facility] INVASIVE CV LAB;  Service: Cardiovascular;  Laterality: N/A;   CORONARY ULTRASOUND/IVUS N/A 05/20/2021   Procedure: Intravascular Ultrasound/IVUS;  Surgeon: Iran Ouch, MD;  Location: [facility] INVASIVE CV LAB;  Service: Cardiovascular;  Laterality: N/A;   IRRIGATION AND DEBRIDEMENT KNEE Left    LEFT HEART CATH AND CORONARY ANGIOGRAPHY  N/A 01/24/2020   Procedure: LEFT HEART CATH AND CORONARY ANGIOGRAPHY;  Surgeon: Iran Ouch, MD;  Location: [facility] INVASIVE CV LAB;  Service: Cardiovascular;  Laterality: N/A;   LEFT HEART CATH AND CORONARY ANGIOGRAPHY  N/A 11/18/2021   Procedure: LEFT HEART CATH AND CORONARY ANGIOGRAPHY;  Surgeon: Iran Ouch, MD;  Location: [facility] INVASIVE CV LAB;  Service: Cardiovascular;  Laterality: N/A;   LEFT HEART CATH AND CORONARY ANGIOGRAPHY N/A 02/13/2022   Procedure: LEFT HEART CATH AND CORONARY ANGIOGRAPHY;  Surgeon: Marykay Lex, MD;  Location: [facility] INVASIVE CV LAB;  Service: Cardiovascular;  Laterality: N/A;   LEFT HEART CATH AND CORS/GRAFTS ANGIOGRAPHY N/A 05/20/2021   Procedure: LEFT HEART CATH AND CORS/GRAFTS ANGIOGRAPHY;  Surgeon: Iran Ouch, MD;  Location: [facility] INVASIVE CV LAB;  Service: Cardiovascular;  Laterality: N/A;   TYMPANOSTOMY TUBE PLACEMENT Bilateral     Social History   Socioeconomic History   Marital status: Single    Spouse name: Not on file   Number of children: 3   Years of education: Not on file   Highest education level: Not on file  Occupational History   Not on file  Tobacco Use   Smoking status: Some Days    Packs/day: 0.25    Years: 10.00    Additional pack years: 0.00    Total pack years: 2.50    Types: Cigarettes    Passive exposure: Past   Smokeless tobacco: Never   Tobacco comments:    Smokes 1-2 cigarettes every 2-3 days-05/06/2022  Vaping Use   Vaping Use: Never used  Substance and Sexual Activity   Alcohol use: Yes    Alcohol/week: 1.0 standard drink of alcohol    Types: 1 Shots of liquor per week    Comment: socially   Drug use: Yes    Types: Codeine   Sexual activity: Yes  Other Topics Concern   Not on file  Social History Narrative   Lives with his uncle    Social Determinants of Health   Financial Resource Strain: Not on file  Food Insecurity: No Food Insecurity (05/01/2022)   Hunger Vital Sign    Worried About  Running Out of Food in the Last Year: Never true    Ran Out of Food in the Last Year: Never true  Transportation Needs: No Transportation Needs (05/01/2022)   PRAPARE - Administrator, Civil Service (Medical): No    Lack of Transportation (Non-Medical): No  Physical Activity: Not on file  Stress: Not on file  Social Connections: Not on file  Intimate Partner Violence: Not At Risk (05/01/2022)   Humiliation, Afraid, Rape, and Kick questionnaire    Fear of Current or Ex-Partner: No    Emotionally Abused: No    Physically Abused: No    Sexually Abused: No     No Known Allergies   CBC    Component Value Date/Time   WBC 8.0 06/05/2022 1239   RBC 5.65 06/05/2022 1239   HGB 14.7 06/05/2022 1239   HGB 16.3 10/03/2013 1306   HCT 45.0 06/05/2022 1239   HCT 48.3 10/03/2013 1306   PLT 425 (H) 06/05/2022 1239   PLT 239 10/03/2013 1306   MCV 79.6 (L) 06/05/2022 1239   MCV 95 10/03/2013 1306   MCH 26.0 06/05/2022 1239   MCHC 32.7 06/05/2022 1239   RDW 15.9 (H) 06/05/2022 1239   RDW 12.5 10/03/2013 1306   LYMPHSABS 2.4 05/01/2022 0800   LYMPHSABS 2.0 10/03/2013 1306   MONOABS 1.0 05/01/2022 0800   MONOABS 0.9 10/03/2013 1306   EOSABS 1.1 (H) 05/01/2022 0800   EOSABS 0.5 10/03/2013 1306   BASOSABS 0.1 05/01/2022 0800   BASOSABS 0.1 10/03/2013 1306  Pulmonary Functions Testing Results:     No data to display          Outpatient Medications Prior to Visit  Medication Sig Dispense Refill   aspirin EC 81 MG tablet Take 1 tablet (81 mg total) by mouth daily. Swallow whole. 90 tablet 3   budesonide-formoterol (SYMBICORT) 80-4.5 MCG/ACT inhaler Inhale 2 puffs into the lungs every 4 (four) hours as needed. 3 each 0   clopidogrel (PLAVIX) 75 MG tablet Take 1 tablet (75 mg total) by mouth daily. 90 tablet 3   colchicine 0.6 MG tablet Take 1 tablet (0.6 mg total) by mouth 2 (two) times daily. 60 tablet 1   dextromethorphan-guaiFENesin (MUCINEX DM) 30-600 MG 12hr tablet Take 1  tablet by mouth 2 (two) times daily as needed for cough. 30 tablet 0   empagliflozin (JARDIANCE) 10 MG TABS tablet Take 10 mg by mouth daily.     famotidine (PEPCID) 20 MG tablet Take 1 tablet (20 mg total) by mouth 2 (two) times daily. 60 tablet 3   furosemide (LASIX) 40 MG tablet Take 1 tablet (40 mg total) by mouth daily. 90 tablet 3   gabapentin (NEURONTIN) 100 MG capsule Take 1 capsule (100 mg total) by mouth 2 (two) times daily. 60 capsule 2   losartan (COZAAR) 50 MG tablet Take 1 tablet (50 mg total) by mouth daily. 90 tablet 3   metoprolol succinate (TOPROL-XL) 25 MG 24 hr tablet Take 2 tablets (50 mg total) by mouth daily. Stop taking metoprolol if you continue to use cocaine 180 tablet 3   nicotine (NICODERM CQ - DOSED IN MG/24 HOURS) 21 mg/24hr patch Place 1 patch (21 mg total) onto the skin daily. 28 patch 0   nitroGLYCERIN (NITROSTAT) 0.4 MG SL tablet Place 1 tablet (0.4 mg total) under the tongue every 5 (five) minutes as needed for chest pain (if chest pain not resolved after 3 doses, please call 911). 25 tablet 2   pantoprazole (PROTONIX) 40 MG tablet Take 1 tablet (40 mg total) by mouth 2 (two) times daily. 60 tablet 1   rosuvastatin (CRESTOR) 40 MG tablet Take 1 tablet (40 mg total) by mouth daily. 90 tablet 3   spironolactone (ALDACTONE) 25 MG tablet Take 1 tablet (25 mg total) by mouth daily. 90 tablet 3   No facility-administered medications prior to visit.

## 2022-06-17 NOTE — Telephone Encounter (Signed)
Patient was seen by pulmonary today and was told to come by our office and let his cardiologist know that his weight is up about 20lbs in 1-2 weeks, he c/o shortness of breath with exertion, and notices some swelling in his abdomen. Pt denies chest pain and overall said he doesn't fell bad.   Routed to Dr.Bensimhon  for advice

## 2022-06-18 NOTE — Telephone Encounter (Signed)
Pt aware of med change and appt scheduled.

## 2022-06-19 ENCOUNTER — Encounter: Payer: Medicaid Other | Admitting: *Deleted

## 2022-06-19 DIAGNOSIS — Z951 Presence of aortocoronary bypass graft: Secondary | ICD-10-CM | POA: Diagnosis not present

## 2022-06-19 NOTE — Progress Notes (Signed)
Daily Session Note  Patient Details  Name: Logan Crawford MRN: [id number] Date of Birth: April 12, 1975 Referring Provider:   Flowsheet Row Cardiac Rehab from 05/20/2022 in [facility] Cardiac and Pulmonary Rehab  Referring Provider Dr. Debbe Odea, MD       Encounter Date: 06/19/2022  Check In:  Session Check In - 06/19/22 1123       Check-In   Supervising physician immediately available to respond to emergencies See telemetry face sheet for immediately available ER MD    Location [facility]-Cardiac & Pulmonary Rehab    Staff Present Lanny Hurst, RN, ADN;Denise Rhew, PhD, RN, CNS, Josephine Cables, BS, Exercise Physiologist    Virtual Visit No    Medication changes reported     No    Fall or balance concerns reported    No    Warm-up and Cool-down Performed on first and last piece of equipment    Resistance Training Performed Yes    VAD Patient? No    PAD/SET Patient? No      Pain Assessment   Currently in Pain? No/denies                Social History   Tobacco Use  Smoking Status Some Days   Packs/day: 0.25   Years: 10.00   Additional pack years: 0.00   Total pack years: 2.50   Types: Cigarettes   Passive exposure: Past  Smokeless Tobacco Never  Tobacco Comments   Smokes 1-2 cigarettes every 2-3 days-05/06/2022    Goals Met:  Independence with exercise equipment Exercise tolerated well No report of concerns or symptoms today Strength training completed today  Goals Unmet:  Not Applicable  Comments: Pt able to follow exercise prescription today without complaint.  Will continue to monitor for progression.    Dr. Bethann Punches is Medical Director for [facility] Cardiac Rehabilitation.  Dr. Vida Rigger is Medical Director for [facility] Pulmonary Rehabilitation.

## 2022-06-20 ENCOUNTER — Ambulatory Visit (INDEPENDENT_AMBULATORY_CARE_PROVIDER_SITE_OTHER): Payer: Medicaid Other | Admitting: Nurse Practitioner

## 2022-06-20 ENCOUNTER — Encounter: Payer: Self-pay | Admitting: Internal Medicine

## 2022-06-20 ENCOUNTER — Encounter: Payer: Self-pay | Admitting: Nurse Practitioner

## 2022-06-20 ENCOUNTER — Ambulatory Visit
Admission: RE | Admit: 2022-06-20 | Discharge: 2022-06-20 | Disposition: A | Discharge status: Home / Self Care | Payer: Medicaid Other | Source: Ambulatory Visit | Attending: Internal Medicine | Admitting: Internal Medicine

## 2022-06-20 ENCOUNTER — Other Ambulatory Visit
Admission: RE | Admit: 2022-06-20 | Discharge: 2022-06-20 | Disposition: A | Discharge status: Home / Self Care | Payer: Medicaid Other | Source: Ambulatory Visit | Attending: Internal Medicine | Admitting: Internal Medicine

## 2022-06-20 ENCOUNTER — Ambulatory Visit (HOSPITAL_BASED_OUTPATIENT_CLINIC_OR_DEPARTMENT_OTHER): Payer: Medicaid Other

## 2022-06-20 ENCOUNTER — Ambulatory Visit (HOSPITAL_BASED_OUTPATIENT_CLINIC_OR_DEPARTMENT_OTHER): Payer: Medicaid Other | Admitting: Internal Medicine

## 2022-06-20 VITALS — BP 110/70 | HR 84 | Wt 237.0 lb

## 2022-06-20 VITALS — BP 105/65 | HR 76 | Temp 97.0°F | Ht 72.0 in | Wt 234.0 lb

## 2022-06-20 DIAGNOSIS — F1910 Other psychoactive substance abuse, uncomplicated: Secondary | ICD-10-CM

## 2022-06-20 DIAGNOSIS — N1831 Chronic kidney disease, stage 3a: Secondary | ICD-10-CM

## 2022-06-20 DIAGNOSIS — I2510 Atherosclerotic heart disease of native coronary artery without angina pectoris: Secondary | ICD-10-CM

## 2022-06-20 DIAGNOSIS — Z1159 Encounter for screening for other viral diseases: Secondary | ICD-10-CM

## 2022-06-20 DIAGNOSIS — R3911 Hesitancy of micturition: Secondary | ICD-10-CM

## 2022-06-20 DIAGNOSIS — R0602 Shortness of breath: Secondary | ICD-10-CM | POA: Diagnosis not present

## 2022-06-20 DIAGNOSIS — I5032 Chronic diastolic (congestive) heart failure: Secondary | ICD-10-CM | POA: Diagnosis not present

## 2022-06-20 DIAGNOSIS — E785 Hyperlipidemia, unspecified: Secondary | ICD-10-CM

## 2022-06-20 DIAGNOSIS — I10 Essential (primary) hypertension: Secondary | ICD-10-CM

## 2022-06-20 DIAGNOSIS — R7989 Other specified abnormal findings of blood chemistry: Secondary | ICD-10-CM | POA: Diagnosis not present

## 2022-06-20 DIAGNOSIS — Z1211 Encounter for screening for malignant neoplasm of colon: Secondary | ICD-10-CM

## 2022-06-20 DIAGNOSIS — I25118 Atherosclerotic heart disease of native coronary artery with other forms of angina pectoris: Secondary | ICD-10-CM

## 2022-06-20 DIAGNOSIS — Z720 Tobacco use: Secondary | ICD-10-CM | POA: Diagnosis not present

## 2022-06-20 LAB — BASIC METABOLIC PANEL
Anion gap: 10 (ref 5–15)
BUN: 20 mg/dL (ref 6–20)
CO2: 20 mmol/L — ABNORMAL LOW (ref 22–32)
Calcium: 9 mg/dL (ref 8.9–10.3)
Chloride: 105 mmol/L (ref 98–111)
Creatinine, Ser: 1.48 mg/dL — ABNORMAL HIGH (ref 0.61–1.24)
GFR, Estimated: 58 mL/min — ABNORMAL LOW (ref >60)
Glucose, Bld: 172 mg/dL — ABNORMAL HIGH (ref 70–99)
Potassium: 4.1 mmol/L (ref 3.5–5.1)
Sodium: 135 mmol/L (ref 135–145)

## 2022-06-20 LAB — PULMONARY FUNCTION TEST ARMC ONLY
DL/VA % pred: 107 %
DL/VA: 4.75 ml/min/mmHg/L
DLCO unc % pred: 70 %
DLCO unc: 22.3 ml/min/mmHg
FEF 25-75 Post: 2.61 L/sec
FEF 25-75 Pre: 1.87 L/sec
FEF2575-%Change-Post: 39 %
FEF2575-%Pred-Post: 69 %
FEF2575-%Pred-Pre: 49 %
FEV1-%Change-Post: 8 %
FEV1-%Pred-Post: 64 %
FEV1-%Pred-Pre: 58 %
FEV1-Post: 2.72 L
FEV1-Pre: 2.5 L
FEV1FVC-%Change-Post: 3 %
FEV1FVC-%Pred-Pre: 94 %
FEV6-%Change-Post: 6 %
FEV6-%Pred-Post: 67 %
FEV6-%Pred-Pre: 63 %
FEV6-Post: 3.57 L
FEV6-Pre: 3.36 L
FEV6FVC-%Change-Post: 0 %
FEV6FVC-%Pred-Post: 103 %
FEV6FVC-%Pred-Pre: 103 %
FVC-%Change-Post: 5 %
FVC-%Pred-Post: 65 %
FVC-%Pred-Pre: 61 %
FVC-Post: 3.57 L
FVC-Pre: 3.37 L
Post FEV1/FVC ratio: 76 %
Post FEV6/FVC ratio: 100 %
Pre FEV1/FVC ratio: 74 %
Pre FEV6/FVC Ratio: 100 %
RV % pred: 21 %
RV: 0.45 L
TLC % pred: 50 %
TLC: 3.73 L

## 2022-06-20 LAB — BRAIN NATRIURETIC PEPTIDE: B Natriuretic Peptide: 32.8 pg/mL (ref 0.0–100.0)

## 2022-06-20 LAB — CBC
HCT: 44.8 % (ref 39.0–52.0)
Hemoglobin: 14.5 g/dL (ref 13.0–17.0)
MCH: 25.8 pg — ABNORMAL LOW (ref 26.0–34.0)
MCHC: 32.4 g/dL (ref 30.0–36.0)
MCV: 79.7 fL — ABNORMAL LOW (ref 80.0–100.0)
Platelets: 363 10*3/uL (ref 150–400)
RBC: 5.62 MIL/uL (ref 4.22–5.81)
RDW: 17 % — ABNORMAL HIGH (ref 11.5–15.5)
WBC: 9.8 10*3/uL (ref 4.0–10.5)
nRBC: 0 % (ref 0.0–0.2)

## 2022-06-20 NOTE — Assessment & Plan Note (Signed)
BP Readings from Last 3 Encounters:  06/20/22 105/65  06/20/22 110/70  06/17/22 110/70   HTN Controlled . Continue current medications. No changes in management. Discussed DASH diet and dietary sodium restrictions Continue to increase dietary efforts and exercise.  Patient encouraged to maintain close follow-up with cardiology

## 2022-06-20 NOTE — Assessment & Plan Note (Signed)
Continue aspirin 81 mg daily, Plavix 75 mg daily, Crestor 40 mg daily Smoking cessation encouraged Patient encouraged to maintain close follow-up with cardiology

## 2022-06-20 NOTE — Assessment & Plan Note (Signed)
Lab Results  Component Value Date   DDIMER 10.35 (H) 05/02/2022  Found to be elevated during his last hospitalization Rechecking labs today if lab remains elevated will refer patient to hematology.

## 2022-06-20 NOTE — Assessment & Plan Note (Signed)
Smoking cessation counseling provided,

## 2022-06-20 NOTE — Progress Notes (Signed)
   06/20/22 1133  ReDS Vest / Clip  Station Marker D  Ruler Value 43  ReDS Value Range < 36  ReDS Actual Value 26

## 2022-06-20 NOTE — Assessment & Plan Note (Signed)
Lab Results  Component Value Date   CHOL 138 05/02/2022   HDL 26 (L) 05/02/2022   LDLCALC 46 05/02/2022   TRIG 331 (H) 05/02/2022   CHOLHDL 5.3 05/02/2022  Continue Crestor 40 mg daily Avoid fatty fried foods

## 2022-06-20 NOTE — Patient Instructions (Signed)
Your provider has ordered a chest x ray to be performed today  Routine lab work today. Will notify you of abnormal results  Follow up in 2 months  Do the following things EVERYDAY: Weigh yourself in the morning before breakfast. Write it down and keep it in a log. Take your medicines as prescribed Eat low salt foods--Limit salt (sodium) to 2000 mg per day.  Stay as active as you can everyday Limit all fluids for the day to less than 2 liters

## 2022-06-20 NOTE — Patient Instructions (Addendum)
Please get your Tdap vaccine at the pharmacy    It is important that you exercise regularly at least 30 minutes 5 times a week as tolerated  Think about what you will eat, plan ahead. Choose " clean, green, fresh or frozen" over canned, processed or packaged foods which are more sugary, salty and fatty. 70 to 75% of food eaten should be vegetables and fruit. Three meals at set times with snacks allowed between meals, but they must be fruit or vegetables. Aim to eat over a 12 hour period , example 7 am to 7 pm, and STOP after  your last meal of the day. Drink water,generally  no other drink is as healthy. Fruit juice is best enjoyed in a healthy way, by EATING the fruit.  Thanks for choosing [facility] we consider it a privelige to serve you.

## 2022-06-20 NOTE — Assessment & Plan Note (Signed)
Lab Results  Component Value Date   NA 135 06/20/2022   K 4.1 06/20/2022   CO2 20 (L) 06/20/2022   BUN 20 06/20/2022   CREATININE 1.48 (H) 06/20/2022   CALCIUM 9.0 06/20/2022   GLUCOSE 172 (H) 06/20/2022  Avoid NSAIDs and other nephrotoxic agents On Jardiance 10 mg daily

## 2022-06-20 NOTE — Progress Notes (Signed)
Advanced Heart Failure Clinic Note   Referring Physician: Dr. Azucena Cecil PCP: Donell Beers, FNP PCP-Cardiologist: Debbe Odea, MD   HPI:  Logan Crawford is a 47 y.o. male with HTN, polysubstance abuse, tobacco use disorder, CAD s/p PCI in 04/2021, CABG in 12/23.   Hospitalized from 9/23 with NSTEMI. Cath on 9/25 showing significant two-vessel coronary artery disease which was  treated medically and on DAPT. Readmitted 12/23 with NSTEMI.  Cath 02/13/22 which showed recurrent in-stent restenosis in the LAD in the setting of continued cocaine use and poor compliance with antiplatelet medications.  Patient also has significant progression of the RCA disease.  Pre-op EF normal.   Underwent CAG 02/19/22  (LIMA - Diag, SVG - LAD, SVG - RCA). Operative course complicated by severe hyperkalemia (prior to cardiopulmonary bypass with question of malignant hyperthermia syndrome - received dantrolene). Early post-op course was notable for shock physiology and AKI. Required IABP and multiple gtts which were eventually weaned ECHO EF 55-60%. Also had some mild sternal drainage treated with abx Discharged 03/01/22.   I saw him last month and was volume overloaded with large left pleural effusion. Underwent tap with IR on 03/10/22 with 750cc out. Started on Furoscix  Saw General Cards 1/22 with concern for recurrent effusion. Stat CXR with trace effusion.   Admitted in 3/24 with CP. Felt to have pericarditis. Had mild volume overload. Echo 05/01/22  EF 55%  Seen earlier this week in Pulmonary Clinic and weight up 5 pounds so referred back top HF Clinic to reassess  Says he is getting better slowly. Able to do more. But still SOB, particularly in the am. No CP. ReDS lung water 26%. No edema. Going to CR Tuesday and Thursday. Taking 40 mg lasix daily. Has gained 11 pounds in last month. Smoking 2-4 cigs/day. No cocaine. Taking gummies for pain.    Past Medical History:  Diagnosis Date   CAD (coronary  artery disease)    a. 11/2019 NSTEMI/PCI: LM min irregs, LAD 91m (2.75x22 Resolute Onyx DES), 40d, D2 min irregs, LCX/LPAV min irregs, RCA 60ost, 40p/m, EF 45-50%; b. 11/2020 MV: EF 43%, small area of apical ischemia ->? over processing-->low risk.   Hyperlipidemia    Hypertension    Ischemic cardiomyopathy    a. 11/2019 LV Gram: EF 45-50%; b. 12/2019 Echo: EF 50% w/ mod mid-apical anteroseptal HK, GrI DD, nl RV fxn, mild Ao sclerosis.   Tobacco use 11/25/2019    Current Outpatient Medications  Medication Sig Dispense Refill   aspirin EC 81 MG tablet Take 1 tablet (81 mg total) by mouth daily. Swallow whole. 90 tablet 3   budesonide-formoterol (SYMBICORT) 80-4.5 MCG/ACT inhaler Inhale 2 puffs into the lungs every 4 (four) hours as needed. 3 each 0   clopidogrel (PLAVIX) 75 MG tablet Take 1 tablet (75 mg total) by mouth daily. 90 tablet 3   colchicine 0.6 MG tablet Take 1 tablet (0.6 mg total) by mouth 2 (two) times daily. 60 tablet 1   dextromethorphan-guaiFENesin (MUCINEX DM) 30-600 MG 12hr tablet Take 1 tablet by mouth 2 (two) times daily as needed for cough. 30 tablet 0   empagliflozin (JARDIANCE) 10 MG TABS tablet Take 10 mg by mouth daily.     famotidine (PEPCID) 20 MG tablet Take 1 tablet (20 mg total) by mouth 2 (two) times daily. 60 tablet 3   furosemide (LASIX) 40 MG tablet Take 1 tablet (40 mg total) by mouth daily. 90 tablet 3   gabapentin (NEURONTIN) 100 MG capsule  Take 1 capsule (100 mg total) by mouth 2 (two) times daily. 60 capsule 2   losartan (COZAAR) 50 MG tablet Take 1 tablet (50 mg total) by mouth daily. 90 tablet 3   metoprolol succinate (TOPROL-XL) 25 MG 24 hr tablet Take 2 tablets (50 mg total) by mouth daily. Stop taking metoprolol if you continue to use cocaine 180 tablet 3   nicotine (NICODERM CQ - DOSED IN MG/24 HOURS) 21 mg/24hr patch Place 1 patch (21 mg total) onto the skin daily. 28 patch 0   nitroGLYCERIN (NITROSTAT) 0.4 MG SL tablet Place 1 tablet (0.4 mg  total) under the tongue every 5 (five) minutes as needed for chest pain (if chest pain not resolved after 3 doses, please call 911). 25 tablet 2   pantoprazole (PROTONIX) 40 MG tablet Take 1 tablet (40 mg total) by mouth 2 (two) times daily. 60 tablet 1   rosuvastatin (CRESTOR) 40 MG tablet Take 1 tablet (40 mg total) by mouth daily. 90 tablet 3   spironolactone (ALDACTONE) 25 MG tablet Take 1 tablet (25 mg total) by mouth daily. 90 tablet 3   No current facility-administered medications for this visit.    No Known Allergies    Social History   Socioeconomic History   Marital status: Single    Spouse name: Not on file   Number of children: 3   Years of education: Not on file   Highest education level: Not on file  Occupational History   Not on file  Tobacco Use   Smoking status: Some Days    Packs/day: 0.25    Years: 10.00    Additional pack years: 0.00    Total pack years: 2.50    Types: Cigarettes    Passive exposure: Past   Smokeless tobacco: Never   Tobacco comments:    Smokes 1-2 cigarettes every 2-3 days-05/06/2022  Vaping Use   Vaping Use: Never used  Substance and Sexual Activity   Alcohol use: Yes    Alcohol/week: 1.0 standard drink of alcohol    Types: 1 Shots of liquor per week    Comment: socially   Drug use: Yes    Types: Codeine   Sexual activity: Yes  Other Topics Concern   Not on file  Social History Narrative   Lives with his uncle    Social Determinants of Health   Financial Resource Strain: Not on file  Food Insecurity: No Food Insecurity (05/01/2022)   Hunger Vital Sign    Worried About Running Out of Food in the Last Year: Never true    Ran Out of Food in the Last Year: Never true  Transportation Needs: No Transportation Needs (05/01/2022)   PRAPARE - Administrator, Civil Service (Medical): No    Lack of Transportation (Non-Medical): No  Physical Activity: Not on file  Stress: Not on file  Social Connections: Not on file   Intimate Partner Violence: Not At Risk (05/01/2022)   Humiliation, Afraid, Rape, and Kick questionnaire    Fear of Current or Ex-Partner: No    Emotionally Abused: No    Physically Abused: No    Sexually Abused: No      Family History  Problem Relation Age of Onset   Coronary artery disease Mother    Arrhythmia Mother        Pacemaker placed in 2021   Heart failure Mother    Coronary artery disease Father     Vitals:   06/20/22 1120  BP:  110/70  Pulse: 84  SpO2: 98%  Weight: 237 lb (107.5 kg)    Wt Readings from Last 3 Encounters:  06/20/22 237 lb (107.5 kg)  06/17/22 231 lb 12.8 oz (105.1 kg)  05/20/22 226 lb 12.8 oz (102.9 kg)     PHYSICAL EXAM: General:  Sitting in chair. Tachypneic HEENT: normal Neck: supple. no JVD. Carotids 2+ bilat; no bruits. No lymphadenopathy or thryomegaly appreciated. Cor: PMI nondisplaced. Regular rate & rhythm. No rubs, gallops or murmurs. Lungs: clear Abdomen: obese soft, nontender, nondistended. No hepatosplenomegaly. No bruits or masses. Good bowel sounds. Extremities: no cyanosis, clubbing, rash, edema Neuro: alert & orientedx3, cranial nerves grossly intact. moves all 4 extremities w/o difficulty. Affect pleasant   ASSESSMENT & PLAN:  1. Chronic diastolic HF - S/p CABG 12/27.  - Echo 12/29 with EF 50-55%, mild AI, mild MR.  - Bedside echo 03/07/22 in clinic LVEF 60% RV ok. No pericardial effusion - Echo 05/01/22 EF 55% - Improving NYHA II-III - Despite weight gain and SOB volume status actually looks quite good on exam and ReDS 26%. Doubt fluid gain - Continue Toprol 50 mg daily (await PFTs. If significant obstructive component can switch to bisoprolol) - Continue losartan 25 mg daily  - Continue spironolactone 25 mg daily.  - Continue Jardiance10 mg daily.   - Labs today and CXR - Will not titrate GDMT as BP has been soft - Suspect component of dyspnea is anxiety   2. CAD with NSTEMI - Hx prior PCI/stent to LAD in 2021  and PCI/stent to LAD in 03/23 d/t in-stent restenosis - NSTEMI 12/23 S/p CABG X 3 on 02/19/22 (LIMA to Diag, SVG to LAD, SVG to RCA) - No s/s angina. Continue DAPT - Continue CR   3. CKD 3a -Baseline Scr 1.3-1.6 - Labs today   4. Polysubstance abuse -Tobacco and cocaine -UDS + for cocaine on admit - Reports he is off cocaine - Still smoking a few cisg per day - Discussed need to quit    5. Peripheral neuropathy - Continue gabapentin  6. Snoring - Pulmonary has ordered sleep study  7. COPD - per Pulmonary. PFTs pending   Logan Meres, MD 06/20/22

## 2022-06-20 NOTE — Assessment & Plan Note (Signed)
Now resolved.  

## 2022-06-20 NOTE — Assessment & Plan Note (Signed)
Denies using cocaine, uses THC gummies. Need to avoid use of illicit drugs discussed with the patient He Verbalized understanding

## 2022-06-20 NOTE — Progress Notes (Signed)
Established Patient Office Visit  Subjective:  Patient ID: Logan Crawford, male    DOB: 09/16/1975  Age: 47 y.o. MRN: [id number]  CC:  Chief Complaint  Patient presents with   Follow-up    HPI Logan Crawford is a 47 y.o. male  has a past medical history of CAD (coronary artery disease), Hyperlipidemia, Hypertension, Ischemic cardiomyopathy, and Tobacco use (11/25/2019).  Patient presents for follow-up for his chronic medical conditions .  Patient stated that he has been getting better slowly denies chest pain but still has some shortness of breath, he reports gaining weight recently.  Had a follow-up with cardiology today.  Smokes 2 to 4 cigarettes/day.  He denies use of cocaine takes THC Gummies for pain.   Patient denies adverse reactions to current medications  Due for colonoscopy referral placed today   Past Medical History:  Diagnosis Date   CAD (coronary artery disease)    a. 11/2019 NSTEMI/PCI: LM min irregs, LAD 60m (2.75x22 Resolute Onyx DES), 40d, D2 min irregs, LCX/LPAV min irregs, RCA 60ost, 40p/m, EF 45-50%; b. 11/2020 MV: EF 43%, small area of apical ischemia ->? over processing-->low risk.   Hyperlipidemia    Hypertension    Ischemic cardiomyopathy    a. 11/2019 LV Gram: EF 45-50%; b. 12/2019 Echo: EF 50% w/ mod mid-apical anteroseptal HK, GrI DD, nl RV fxn, mild Ao sclerosis.   Tobacco use 11/25/2019    Past Surgical History:  Procedure Laterality Date   CORONARY ARTERY BYPASS GRAFT N/A 02/19/2022   Procedure: CORONARY ARTERY BYPASS GRAFTING (CABG) X THREE, USING ENDOSCOPICALLY HARVESTED RIGHT GREATER SAPHENOUS VEIN  AND LEFT ATRIAL APPENDAGE CLIPPING;  Surgeon: Lyn Hollingshead, MD;  Location: [facility];  Service: Open Heart Surgery;  Laterality: N/A;   CORONARY STENT INTERVENTION N/A 01/24/2020   Procedure: CORONARY STENT INTERVENTION;  Surgeon: Iran Ouch, MD;  Location: [facility];  Service: Cardiovascular;  Laterality: N/A;   CORONARY  STENT INTERVENTION N/A 05/20/2021   Procedure: CORONARY STENT INTERVENTION;  Surgeon: Iran Ouch, MD;  Location: [facility];  Service: Cardiovascular;  Laterality: N/A;   CORONARY ULTRASOUND/IVUS N/A 05/20/2021   Procedure: Intravascular Ultrasound/IVUS;  Surgeon: Iran Ouch, MD;  Location: [facility];  Service: Cardiovascular;  Laterality: N/A;   IRRIGATION AND DEBRIDEMENT KNEE Left    LEFT HEART CATH AND CORONARY ANGIOGRAPHY N/A 01/24/2020   Procedure: LEFT HEART CATH AND CORONARY ANGIOGRAPHY;  Surgeon: Iran Ouch, MD;  Location: [facility];  Service: Cardiovascular;  Laterality: N/A;   LEFT HEART CATH AND CORONARY ANGIOGRAPHY N/A 11/18/2021   Procedure: LEFT HEART CATH AND CORONARY ANGIOGRAPHY;  Surgeon: Iran Ouch, MD;  Location: [facility];  Service: Cardiovascular;  Laterality: N/A;   LEFT HEART CATH AND CORONARY ANGIOGRAPHY N/A 02/13/2022   Procedure: LEFT HEART CATH AND CORONARY ANGIOGRAPHY;  Surgeon: Marykay Lex, MD;  Location: [facility];  Service: Cardiovascular;  Laterality: N/A;   LEFT HEART CATH AND CORS/GRAFTS ANGIOGRAPHY N/A 05/20/2021   Procedure: LEFT HEART CATH AND CORS/GRAFTS ANGIOGRAPHY;  Surgeon: Iran Ouch, MD;  Location: [facility];  Service: Cardiovascular;  Laterality: N/A;   TYMPANOSTOMY TUBE PLACEMENT Bilateral     Family History  Problem Relation Age of Onset   Coronary artery disease Mother    Arrhythmia Mother        Pacemaker placed in 2021   Heart failure Mother    Coronary artery  disease Father     Social History   Socioeconomic History   Marital status: Single    Spouse name: Not on file   Number of children: 3   Years of education: Not on file   Highest education level: Not on file  Occupational History   Not on file  Tobacco Use   Smoking status: Some Days    Packs/day: 0.25    Years: 10.00    Additional pack years: 0.00    Total pack years: 2.50     Types: Cigarettes    Passive exposure: Past   Smokeless tobacco: Never   Tobacco comments:    Smokes 1-2 cigarettes every 2-3 days-05/06/2022  Vaping Use   Vaping Use: Never used  Substance and Sexual Activity   Alcohol use: Yes    Alcohol/week: 1.0 standard drink of alcohol    Types: 1 Shots of liquor per week    Comment: socially   Drug use: Yes    Types: Codeine   Sexual activity: Yes  Other Topics Concern   Not on file  Social History Narrative   Lives with his uncle    Social Determinants of Health   Financial Resource Strain: Not on file  Food Insecurity: No Food Insecurity (05/01/2022)   Hunger Vital Sign    Worried About Running Out of Food in the Last Year: Never true    Ran Out of Food in the Last Year: Never true  Transportation Needs: No Transportation Needs (05/01/2022)   PRAPARE - Administrator, Civil Service (Medical): No    Lack of Transportation (Non-Medical): No  Physical Activity: Not on file  Stress: Not on file  Social Connections: Not on file  Intimate Partner Violence: Not At Risk (05/01/2022)   Humiliation, Afraid, Rape, and Kick questionnaire    Fear of Current or Ex-Partner: No    Emotionally Abused: No    Physically Abused: No    Sexually Abused: No    Outpatient Medications Prior to Visit  Medication Sig Dispense Refill   aspirin EC 81 MG tablet Take 1 tablet (81 mg total) by mouth daily. Swallow whole. 90 tablet 3   budesonide-formoterol (SYMBICORT) 80-4.5 MCG/ACT inhaler Inhale 2 puffs into the lungs every 4 (four) hours as needed. 3 each 0   clopidogrel (PLAVIX) 75 MG tablet Take 1 tablet (75 mg total) by mouth daily. 90 tablet 3   colchicine 0.6 MG tablet Take 1 tablet (0.6 mg total) by mouth 2 (two) times daily. 60 tablet 1   empagliflozin (JARDIANCE) 10 MG TABS tablet Take 10 mg by mouth daily.     famotidine (PEPCID) 20 MG tablet Take 1 tablet (20 mg total) by mouth 2 (two) times daily. 60 tablet 3   furosemide (LASIX) 40  MG tablet Take 1 tablet (40 mg total) by mouth daily. 90 tablet 3   gabapentin (NEURONTIN) 100 MG capsule Take 1 capsule (100 mg total) by mouth 2 (two) times daily. 60 capsule 2   losartan (COZAAR) 50 MG tablet Take 1 tablet (50 mg total) by mouth daily. 90 tablet 3   metoprolol succinate (TOPROL-XL) 25 MG 24 hr tablet Take 2 tablets (50 mg total) by mouth daily. Stop taking metoprolol if you continue to use cocaine 180 tablet 3   nicotine (NICODERM CQ - DOSED IN MG/24 HOURS) 21 mg/24hr patch Place 1 patch (21 mg total) onto the skin daily. 28 patch 0   nitroGLYCERIN (NITROSTAT) 0.4 MG SL tablet Place 1  tablet (0.4 mg total) under the tongue every 5 (five) minutes as needed for chest pain (if chest pain not resolved after 3 doses, please call 911). 25 tablet 2   pantoprazole (PROTONIX) 40 MG tablet Take 1 tablet (40 mg total) by mouth 2 (two) times daily. 60 tablet 1   rosuvastatin (CRESTOR) 40 MG tablet Take 1 tablet (40 mg total) by mouth daily. 90 tablet 3   spironolactone (ALDACTONE) 25 MG tablet Take 1 tablet (25 mg total) by mouth daily. 90 tablet 3   VENTOLIN HFA 108 (90 Base) MCG/ACT inhaler SMARTSIG:1 Via Inhaler Daily     dextromethorphan-guaiFENesin (MUCINEX DM) 30-600 MG 12hr tablet Take 1 tablet by mouth 2 (two) times daily as needed for cough. (Patient not taking: Reported on 06/20/2022) 30 tablet 0   No facility-administered medications prior to visit.    Allergies  Allergen Reactions   Bee Pollen     ROS Review of Systems  Constitutional:  Negative for activity change, appetite change, chills, diaphoresis, fatigue, fever and unexpected weight change.  HENT:  Negative for congestion, dental problem, drooling and ear discharge.   Eyes:  Negative for pain, discharge, redness and itching.  Respiratory:  Positive for shortness of breath. Negative for apnea, cough, choking, chest tightness and wheezing.   Cardiovascular: Negative.  Negative for chest pain, palpitations and leg  swelling.  Gastrointestinal:  Negative for abdominal distention, abdominal pain, anal bleeding, blood in stool, constipation, diarrhea and vomiting.  Endocrine: Negative for polydipsia, polyphagia and polyuria.  Genitourinary:  Negative for difficulty urinating, flank pain, frequency and genital sores.  Musculoskeletal: Negative.  Negative for arthralgias, back pain, gait problem and joint swelling.  Skin:  Negative for color change, pallor and rash.  Neurological:  Negative for dizziness, facial asymmetry, light-headedness, numbness and headaches.  Psychiatric/Behavioral:  Negative for agitation, behavioral problems, confusion, hallucinations, self-injury, sleep disturbance and suicidal ideas.       Objective:    Physical Exam Vitals and nursing note reviewed.  Constitutional:      General: He is not in acute distress.    Appearance: Normal appearance. He is obese. He is not ill-appearing, toxic-appearing or diaphoretic.  HENT:     Mouth/Throat:     Mouth: Mucous membranes are moist.     Pharynx: Oropharynx is clear. No oropharyngeal exudate or posterior oropharyngeal erythema.  Eyes:     General: No scleral icterus.       Right eye: No discharge.        Left eye: No discharge.     Extraocular Movements: Extraocular movements intact.     Conjunctiva/sclera: Conjunctivae normal.  Cardiovascular:     Rate and Rhythm: Normal rate and regular rhythm.     Pulses: Normal pulses.     Heart sounds: Normal heart sounds. No murmur heard.    No friction rub. No gallop.  Pulmonary:     Effort: Pulmonary effort is normal. No respiratory distress.     Breath sounds: Normal breath sounds. No stridor. No wheezing, rhonchi or rales.  Chest:     Chest wall: No tenderness.  Abdominal:     General: There is no distension.     Palpations: Abdomen is soft.     Tenderness: There is no abdominal tenderness. There is no right CVA tenderness, left CVA tenderness or guarding.  Musculoskeletal:         General: No swelling, tenderness, deformity or signs of injury.     Right lower leg: No edema.  Left lower leg: No edema.  Skin:    General: Skin is warm and dry.     Capillary Refill: Capillary refill takes 2 to 3 seconds.     Coloration: Skin is not jaundiced or pale.     Findings: No bruising, erythema or lesion.  Neurological:     Mental Status: He is alert and oriented to person, place, and time.     Motor: No weakness.     Coordination: Coordination normal.     Gait: Gait normal.  Psychiatric:        Mood and Affect: Mood normal.        Behavior: Behavior normal.        Thought Content: Thought content normal.        Judgment: Judgment normal.     BP 105/65   Pulse 76   Temp (!) 97 F (36.1 C)   Ht 6' (1.829 m)   Wt 234 lb (106.1 kg)   SpO2 97%   BMI 31.74 kg/m  Wt Readings from Last 3 Encounters:  06/20/22 234 lb (106.1 kg)  06/20/22 237 lb (107.5 kg)  06/17/22 231 lb 12.8 oz (105.1 kg)    No results found for: "TSH" Lab Results  Component Value Date   WBC 9.8 06/20/2022   HGB 14.5 06/20/2022   HCT 44.8 06/20/2022   MCV 79.7 (L) 06/20/2022   PLT 363 06/20/2022   Lab Results  Component Value Date   NA 135 06/20/2022   K 4.1 06/20/2022   CO2 20 (L) 06/20/2022   GLUCOSE 172 (H) 06/20/2022   BUN 20 06/20/2022   CREATININE 1.48 (H) 06/20/2022   BILITOT 0.6 06/05/2022   ALKPHOS 90 06/05/2022   AST 24 06/05/2022   ALT 16 06/05/2022   PROT 7.6 06/05/2022   ALBUMIN 3.8 06/05/2022   CALCIUM 9.0 06/20/2022   ANIONGAP 10 06/20/2022   EGFR 48 (L) 03/14/2022   Lab Results  Component Value Date   CHOL 138 05/02/2022   Lab Results  Component Value Date   HDL 26 (L) 05/02/2022   Lab Results  Component Value Date   LDLCALC 46 05/02/2022   Lab Results  Component Value Date   TRIG 331 (H) 05/02/2022   Lab Results  Component Value Date   CHOLHDL 5.3 05/02/2022   Lab Results  Component Value Date   HGBA1C 5.2 04/14/2022      Assessment &  Plan:   Problem List Items Addressed This Visit       Cardiovascular and Mediastinum   Hypertension    BP Readings from Last 3 Encounters:  06/20/22 105/65  06/20/22 110/70  06/17/22 110/70   HTN Controlled . Continue current medications. No changes in management. Discussed DASH diet and dietary sodium restrictions Continue to increase dietary efforts and exercise.  Patient encouraged to maintain close follow-up with cardiology      CAD (coronary artery disease)    Continue aspirin 81 mg daily, Plavix 75 mg daily, Crestor 40 mg daily Smoking cessation encouraged Patient encouraged to maintain close follow-up with cardiology        Genitourinary   Stage 3a chronic kidney disease (HCC)    Lab Results  Component Value Date   NA 135 06/20/2022   K 4.1 06/20/2022   CO2 20 (L) 06/20/2022   BUN 20 06/20/2022   CREATININE 1.48 (H) 06/20/2022   CALCIUM 9.0 06/20/2022   GLUCOSE 172 (H) 06/20/2022  Avoid NSAIDs and other nephrotoxic agents On Jardiance 10  mg daily        Other   Polysubstance abuse (HCC) (Chronic)    Denies using cocaine, uses THC gummies. Need to avoid use of illicit drugs discussed with the patient He Verbalized understanding      Tobacco use    Smoking cessation counseling provided,       Hyperlipidemia    Lab Results  Component Value Date   CHOL 138 05/02/2022   HDL 26 (L) 05/02/2022   LDLCALC 46 05/02/2022   TRIG 331 (H) 05/02/2022   CHOLHDL 5.3 05/02/2022  Continue Crestor 40 mg daily Avoid fatty fried foods      Urinary hesitancy    Now resolved      Elevated d-dimer - Primary    Lab Results  Component Value Date   DDIMER 10.35 (H) 05/02/2022  Found to be elevated during his last hospitalization Rechecking labs today if lab remains elevated will refer patient to hematology.      Relevant Orders   D-dimer, quantitative   Other Visit Diagnoses     Screening for colon cancer       Relevant Orders   Ambulatory referral to  Gastroenterology   Need for hepatitis C screening test       Relevant Orders   Hepatitis C antibody       No orders of the defined types were placed in this encounter.   Follow-up: Return in about 4 months (around 10/20/2022) for HTN, CHF.    Donell Beers, FNP

## 2022-06-21 LAB — HEPATITIS C ANTIBODY: Hep C Virus Ab: NONREACTIVE

## 2022-06-21 LAB — D-DIMER, QUANTITATIVE: D-DIMER: 0.32 mg/L FEU (ref 0.00–0.49)

## 2022-06-25 ENCOUNTER — Other Ambulatory Visit: Payer: Self-pay

## 2022-06-26 ENCOUNTER — Other Ambulatory Visit: Payer: Self-pay

## 2022-06-26 ENCOUNTER — Encounter: Payer: Medicaid Other | Attending: Cardiology | Admitting: *Deleted

## 2022-06-26 DIAGNOSIS — Z951 Presence of aortocoronary bypass graft: Secondary | ICD-10-CM | POA: Diagnosis present

## 2022-06-26 NOTE — Progress Notes (Signed)
Daily Session Note  Patient Details  Name: JAMAIR CATO MRN: [id number] Date of Birth: [birth year] Referring Provider:   Flowsheet Row Cardiac Rehab from 05/20/2022 in [facility] Cardiac [facility]  Referring Provider Dr. Debbe Odea, MD       Encounter Date: 06/26/2022  Check In:  Session Check In - 06/26/22 1120       Check-In   Supervising physician immediately available to respond to emergencies See telemetry face sheet for immediately available ER MD    Location [facility]Card[facility]    Staff Present Susann Givens, RN BSN;Jessica Juanetta Gosling, MA, RCEP, CCRP, Zackery Barefoot, MS, ACSM CEP, Exercise Physiologist;Laureen Manson Passey, BS, RRT, CPFT    Virtual Visit No    Medication changes reported     No    Fall or balance concerns reported    No    Warm-up and Cool-down Performed on first and last piece of equipment    Resistance Training Performed Yes    VAD Patient? No    PAD/SET Patient? No      Pain Assessment   Currently in Pain? No/denies                Social History   Tobacco Use  Smoking Status Some Days   Packs/day: 0.25   Years: 10.00   Additional pack years: 0.00   Total pack years: 2.50   Types: Cigarettes   Passive exposure: Past  Smokeless Tobacco Never  Tobacco Comments   Smokes 1-2 cigarettes every 2-3 days-05/06/2022    Goals Met:  Independence with exercise equipment Exercise tolerated well No report of concerns or symptoms today Strength training completed today  Goals Unmet:  Not Applicable  Comments: Pt able to follow exercise prescription today without complaint.  Will continue to monitor for progression.    Dr. Bethann Punches is Medical Director for [facility] Cardiac Rehabilitation.  Dr. Vida Rigger is Medical Director for [facility] Pulmonary Rehabilitation.

## 2022-06-30 ENCOUNTER — Encounter: Payer: Self-pay | Admitting: Gastroenterology

## 2022-07-01 ENCOUNTER — Encounter: Payer: Medicaid Other | Admitting: *Deleted

## 2022-07-01 DIAGNOSIS — Z951 Presence of aortocoronary bypass graft: Secondary | ICD-10-CM | POA: Diagnosis not present

## 2022-07-01 NOTE — Progress Notes (Signed)
Daily Session Note  Patient Details  Name: SUFYAN SCHOOL MRN: [id number] Date of Birth: 1975-11-19 Referring Provider:   Flowsheet Row Cardiac Rehab from 05/20/2022 in [facility] Card[facility] Rehab  Referring Provider Dr. Debbe Odea, MD       Encounter Date: 07/01/2022  Check In:  Session Check In - 07/01/22 1113       Check-In   Supervising physician immediately available to respond to emergencies See telemetry face sheet for immediately available ER MD    Location [facility]    Staff Present Susann Givens, RN BSN;Jessica Juanetta Gosling, MA, RCEP, CCRP, Zackery Barefoot, MS, ACSM CEP, Exercise Physiologist    Virtual Visit No    Medication changes reported     No    Fall or balance concerns reported    No    Warm-up and Cool-down Performed on first and last piece of equipment    Resistance Training Performed Yes    VAD Patient? No    PAD/SET Patient? No      Pain Assessment   Currently in Pain? No/denies                Social History   Tobacco Use  Smoking Status Some Days   Packs/day: 0.25   Years: 10.00   Additional pack years: 0.00   Total pack years: 2.50   Types: Cigarettes   Passive exposure: Past  Smokeless Tobacco Never  Tobacco Comments   Smokes 1-2 cigarettes every 2-3 days-05/06/2022    Goals Met:  Independence with exercise equipment Exercise tolerated well No report of concerns or symptoms today Strength training completed today  Goals Unmet:  Not Applicable  Comments: Pt able to follow exercise prescription today without complaint.  Will continue to monitor for progression.    Dr. Bethann Punches is Medical Director for [facility] Cardiac Rehabilitation.  Dr. Vida Rigger is Medical Director for [facility] Pulmonary Rehabilitation.

## 2022-07-03 ENCOUNTER — Ambulatory Visit (INDEPENDENT_AMBULATORY_CARE_PROVIDER_SITE_OTHER): Payer: Medicaid Other | Admitting: Nurse Practitioner

## 2022-07-03 ENCOUNTER — Telehealth: Payer: Self-pay

## 2022-07-03 ENCOUNTER — Encounter: Payer: Self-pay | Admitting: Nurse Practitioner

## 2022-07-03 VITALS — BP 128/88 | HR 101 | Ht 72.0 in | Wt 237.0 lb

## 2022-07-03 DIAGNOSIS — I25118 Atherosclerotic heart disease of native coronary artery with other forms of angina pectoris: Secondary | ICD-10-CM | POA: Diagnosis not present

## 2022-07-03 DIAGNOSIS — R140 Abdominal distension (gaseous): Secondary | ICD-10-CM | POA: Diagnosis not present

## 2022-07-03 DIAGNOSIS — K219 Gastro-esophageal reflux disease without esophagitis: Secondary | ICD-10-CM

## 2022-07-03 DIAGNOSIS — K625 Hemorrhage of anus and rectum: Secondary | ICD-10-CM

## 2022-07-03 DIAGNOSIS — K649 Unspecified hemorrhoids: Secondary | ICD-10-CM

## 2022-07-03 DIAGNOSIS — A630 Anogenital (venereal) warts: Secondary | ICD-10-CM

## 2022-07-03 NOTE — Patient Instructions (Addendum)
Benefiber 1 tablespoon daily to avoid straining   Apply a small amount of Desitin inside the anal opening and to the external anal area three times daily as needed for anal or hemorrhoidal irritation/bleeding.   We will request your pulmonary & cardiac clearance prior to scheduling your EGD/Colonoscopy.  Thank you for trusting me with your gastrointestinal care!   Alcide Evener, CRNP

## 2022-07-03 NOTE — Telephone Encounter (Signed)
   Patient Name: Logan Crawford  DOB: May 14, 1975 MRN: [id number]  Primary Cardiologist: Debbe Odea, MD  Chart reviewed as part of pre-operative protocol coverage. Pre-op clearance already addressed by colleagues in earlier phone notes. To summarize recommendations:  -Okay to hold Plavix x 5 days and restart when medically safe to do so. -Dr. Angelene Giovanni  Will route this bundled recommendation to requesting provider via Epic fax function and remove from pre-op pool. Please call with questions.  Sharlene Dory, PA-C 07/03/2022, 5:03 PM

## 2022-07-03 NOTE — Progress Notes (Addendum)
07/03/2022 Logan Crawford [id number] 1975-12-20   CHIEF COMPLAINT: Change in bowel pattern, discuss scheduling a colonoscopy   HISTORY OF PRESENT ILLNESS: Logan Crawford is a 47 year old male with a past medical history of hypertension, hyperlipidemia, coronary artery disease s/p NSTEMI 10/2021 and 01/2022, s/p DES 12/2019 and 04/2021, s/p 3 vessel CABG 01/2022, chronic diastolic CHF with LVEF 55% per ECHO 04/2022, pericarditis 04/2022, asthma, emphysema, COPD, CKD stage IIIa, polysubstance abuse including cocaine, chronic tobacco use, left pleural effusion s/p thoracentesis 03/10/2022 and GERD.   He presents to our office today as referred by Edwin Dada NP to schedule a screening colonoscopy.  He denies ever having a colonoscopy.  Father with history of colon cancer.  He endorses having abdominal bloat or swelling which sometimes makes it difficult to take a deep breath in.  He complains of a constant soreness to his epigastric area. Occasional heartburn. No dysphagia. Started Pantoprazole 40mg  po bid a few months ago. No nausea or vomiting.  He passes a normal brown formed bowel movement once daily.  He stated his stool sometimes look black. No Pepto-Bismol or oral iron use.  He sees a small amount of bright red blood on the stool or on the toilet tissue which occurs once every 2 to 4 weeks.  No anorectal pain.  He has infrequent heartburn without dysphagia.  Last used cocaine 01/2022.  He drinks 1 beer weekly.  He smokes a few cigarettes daily.  Significant history of CAD as noted above. 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 He developed early post operative shock pressors and which required pressors and IABP. ECHO EF 55-60%.  Admitted to the hospital 04/2022 with chest pain secondary to pericarditis and mild  volume overload.  Echo 05/01/2022 showed LVEF 55%.  He is followed closely by Dr. Gala Romney in the heart failure clinic.  Last seen 06/20/2022 after he gained 11 pounds in 4 weeks on Lasix 40 mg daily.  At that time, he did not appear fluid overloaded and it was thought his dyspnea may be somewhat related to anxiety.  Chest x-ray 4/26 of 2024 was negative.   He is also followed by pulmonology due to chronic dyspnea with potential concern for sleep apnea with reported loud snoring. Chest CT 01/2022 was notable for paraseptal and centrilobular emphysema consistent with his history of smoking and cocaine use.          Latest Ref Rng & Units 06/20/2022   12:47 PM 06/05/2022   12:39 PM 05/03/2022    4:55 AM  CBC  WBC 4.0 - 10.5 K/uL 9.8  8.0  8.8   Hemoglobin 13.0 - 17.0 g/dL 52.8  41.3  24.4   Hematocrit 39.0 - 52.0 % 44.8  45.0  42.8   Platelets 150 - 400 K/uL 363  425  424        Latest Ref Rng & Units 06/20/2022   12:47 PM 06/05/2022   12:39 PM 05/08/2022   11:45 AM  CMP  Glucose 70 - 99 mg/dL 010  97  99   BUN 6 - 20 mg/dL 20  18  19    Creatinine 0.61 - 1.24 mg/dL 2.72  5.36  6.44   Sodium 135 - 145 mmol/L 135  132  136   Potassium 3.5 - 5.1 mmol/L 4.1  3.8  4.1  Chloride 98 - 111 mmol/L 105  103  104   CO2 22 - 32 mmol/L 20  20  22    Calcium 8.9 - 10.3 mg/dL 9.0  8.7  9.1   Total Protein 6.5 - 8.1 g/dL  7.6    Total Bilirubin 0.3 - 1.2 mg/dL  0.6    Alkaline Phos 38 - 126 U/L  90    AST 15 - 41 U/L  24    ALT 0 - 44 U/L  16       Past Medical History:  Diagnosis Date   CAD (coronary artery disease)    a. 11/2019 NSTEMI/PCI: LM min irregs, LAD 42m (2.75x22 Resolute Onyx DES), 40d, D2 min irregs, LCX/LPAV min irregs, RCA 60ost, 40p/m, EF 45-50%; b. 11/2020 MV: EF 43%, small area of apical ischemia ->? over processing-->low risk.   Hyperlipidemia    Hypertension    Ischemic cardiomyopathy    a. 11/2019 LV Gram: EF 45-50%; b. 12/2019 Echo: EF 50% w/ mod mid-apical anteroseptal HK,  GrI DD, nl RV fxn, mild Ao sclerosis.   Tobacco use 11/25/2019   Past Surgical History:  Procedure Laterality Date   CORONARY ARTERY BYPASS GRAFT N/A 02/19/2022   Procedure: CORONARY ARTERY BYPASS GRAFTING (CABG) X THREE, USING ENDOSCOPICALLY HARVESTED RIGHT GREATER SAPHENOUS VEIN  AND LEFT ATRIAL APPENDAGE CLIPPING;  Surgeon: Lyn Hollingshead, MD;  Location: [facility] OR;  Service: Open Heart Surgery;  Laterality: N/A;   CORONARY STENT INTERVENTION N/A 01/24/2020   Procedure: CORONARY STENT INTERVENTION;  Surgeon: Iran Ouch, MD;  Location: [facility] INVASIVE CV [facility];  Service: Cardiovascular;  Laterality: N/A;   CORONARY STENT INTERVENTION N/A 05/20/2021   Procedure: CORONARY STENT INTERVENTION;  Surgeon: Iran Ouch, MD;  Location: [facility];  Service: Cardiovascular;  Laterality: N/A;   CORONARY ULTRASOUND/IVUS N/A 05/20/2021   Procedure: Intravascular Ultrasound/IVUS;  Surgeon: Iran Ouch, MD;  Location: [facility];  Service: Cardiovascular;  Laterality: N/A;   IRRIGATION AND DEBRIDEMENT KNEE Left    LEFT HEART CATH AND CORONARY ANGIOGRAPHY N/A 01/24/2020   Procedure: LEFT HEART CATH AND CORONARY ANGIOGRAPHY;  Surgeon: Iran Ouch, MD;  Location: [facility];  Service: Cardiovascular;  Laterality: N/A;   LEFT HEART CATH AND CORONARY ANGIOGRAPHY N/A 11/18/2021   Procedure: LEFT HEART CATH AND CORONARY ANGIOGRAPHY;  Surgeon: Iran Ouch, MD;  Location: [facility];  Service: Cardiovascular;  Laterality: N/A;   LEFT HEART CATH AND CORONARY ANGIOGRAPHY N/A 02/13/2022   Procedure: LEFT HEART CATH AND CORONARY ANGIOGRAPHY;  Surgeon: Marykay Lex, MD;  Location: [facility];  Service: Cardiovascular;  Laterality: N/A;   LEFT HEART CATH AND CORS/GRAFTS ANGIOGRAPHY N/A 05/20/2021   Procedure: LEFT HEART CATH AND CORS/GRAFTS ANGIOGRAPHY;  Surgeon: Iran Ouch, MD;  Location: [facility];  Service: Cardiovascular;   Laterality: N/A;   TYMPANOSTOMY TUBE PLACEMENT Bilateral    Social History: He is single. He has 2 sons and one daughter. Chronic smoker, decreased tobacco intake following his CABG surgery 01/2022, continues to smoke 2 to 3 cigarettes daily.  He drinks 1 beer weekly.  He last used cocaine 01/2022.  Family History: Father with history of coronary artery disease, colon polyps and colon cancer.  Mother with history of coronary artery disease, heart failure and cardiac arrhythmia.   Allergies  Allergen Reactions   Bee Pollen       Outpatient Encounter Medications as of 07/03/2022  Medication Sig  aspirin EC 81 MG tablet Take 1 tablet (81 mg total) by mouth daily. Swallow whole.   budesonide-formoterol (SYMBICORT) 80-4.5 MCG/ACT inhaler Inhale 2 puffs into the lungs every 4 (four) hours as needed.   clopidogrel (PLAVIX) 75 MG tablet Take 1 tablet (75 mg total) by mouth daily.   colchicine 0.6 MG tablet Take 1 tablet (0.6 mg total) by mouth 2 (two) times daily.   dextromethorphan-guaiFENesin (MUCINEX DM) 30-600 MG 12hr tablet Take 1 tablet by mouth 2 (two) times daily as needed for cough. (Patient not taking: Reported on 06/20/2022)   empagliflozin (JARDIANCE) 10 MG TABS tablet Take 10 mg by mouth daily.   famotidine (PEPCID) 20 MG tablet Take 1 tablet (20 mg total) by mouth 2 (two) times daily.   furosemide (LASIX) 40 MG tablet Take 1 tablet (40 mg total) by mouth daily.   gabapentin (NEURONTIN) 100 MG capsule Take 1 capsule (100 mg total) by mouth 2 (two) times daily.   losartan (COZAAR) 50 MG tablet Take 1 tablet (50 mg total) by mouth daily.   metoprolol succinate (TOPROL-XL) 25 MG 24 hr tablet Take 2 tablets (50 mg total) by mouth daily. Stop taking metoprolol if you continue to use cocaine   nicotine (NICODERM CQ - DOSED IN MG/24 HOURS) 21 mg/24hr patch Place 1 patch (21 mg total) onto the skin daily.   nitroGLYCERIN (NITROSTAT) 0.4 MG SL tablet Place 1 tablet (0.4 mg total) under the  tongue every 5 (five) minutes as needed for chest pain (if chest pain not resolved after 3 doses, please call 911).   pantoprazole (PROTONIX) 40 MG tablet Take 1 tablet (40 mg total) by mouth 2 (two) times daily.   rosuvastatin (CRESTOR) 40 MG tablet Take 1 tablet (40 mg total) by mouth daily.   spironolactone (ALDACTONE) 25 MG tablet Take 1 tablet (25 mg total) by mouth daily.   VENTOLIN HFA 108 (90 Base) MCG/ACT inhaler SMARTSIG:1 Via Inhaler Daily   No facility-administered encounter medications on file as of 07/03/2022.    REVIEW OF SYSTEMS:  Gen: Denies fever, sweats or chills. No weight loss.  CV: Denies chest pain, palpitations or edema. Resp: See HPI. No hemoptysis. GI: See HPI. GU : Denies urinary burning, blood in urine, increased urinary frequency or incontinence. MS: Denies joint pain, muscles aches or weakness. Derm: Denies rash, itchiness, skin lesions or unhealing ulcers. Psych: + Anxiety. Heme: Denies bruising, easy bleeding. Neuro:  Denies headaches, dizziness or paresthesias. Endo:  + Diabetes.  PHYSICAL EXAM: BP 128/88   Pulse (!) 101   Ht 6' (1.829 m)   Wt 237 lb (107.5 kg)   SpO2 98%   BMI 32.14 kg/m   General: 47 year old male in no acute distress. Head: Normocephalic and atraumatic. Eyes:  Sclerae non-icteric, conjunctive pink. Ears: Normal auditory acuity. Mouth: Dentition intact. No ulcers or lesions.  Neck: Supple, no lymphadenopathy or thyromegaly.  Chest: Mediastinal scar intact. Lungs: Clear bilaterally to auscultation without wheezes, crackles or rhonchi. Heart: Regular rate and rhythm. No murmur, rub or gallop appreciated.  Abdomen: Soft, nontender, nondistended. No masses. No hepatosplenomegaly. Normoactive bowel sounds x 4 quadrants.  Rectal: Friable inflamed anal hemorrhoids. Internal hemorrhoids. Perianal condyloma. DD CMA present during exam.  Musculoskeletal: Symmetrical with no gross deformities. Skin: Warm and dry. No rash or lesions on  visible extremities. Extremities: No edema. Neurological: Alert oriented x 4, no focal deficits.  Psychological:  Alert and cooperative. Normal mood and affect.  ASSESSMENT AND PLAN:  47 year old male with rectal  bleeding with anal/internal hemorrhoids on exam.  Never had a screening colonoscopy. -Benefiber 1 tablespoon daily -Apply a small amount of Desitin inside the anal opening and to the external anal area three times daily as needed for anal or hemorrhoidal irritation/bleeding.  -Colonoscopy at [facility] benefits and risks discussed including risk with sedation, risk of bleeding, perforation and infection. Colonoscopy to be scheduled after pulmonary and cardiac clearance received.  -I will consult with Cathlyn Parsons CRNA and Dr. 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 Occasional heartburn. Intermittent solid black stools.  -Continue Pantoprazole 40 mg p.o. twice daily -Patient to contact her office and his cardiologist if black stools recur -EGD to be scheduled after cardiac and pulmonary clearances received  Significant cardiac history s/p NSTEMI 10/2021 and 01/2022, s/p DES 12/2019 and 04/2021, s/p 3 vessel CABG 01/2022, chronic diastolic CHF/ischemic cardiomyopathy with LVEF 55% per ECHO 04/2022, pericarditis 04/2022. On Plavix and ASA. -Cardiac clearance per primary cardiologist Dr. Azucena Cecil including Plavix hold instructions required prior to scheduling endoscopic evaluation -I will also contact the patient's heart failure cardiologist, Dr. Gala Romney, to verify if the patient's heart failure status is appropriate to proceed with any future endoscopic evaluation  Asthma/emphysema/COPD, left pleural effusion s/p thoracentesis 03/10/2022.  Negative VQ scan 05/03/2022.  PFTs  ordered, not yet completed.  Split night study ordered, not yet scheduled. -Pulmonary clearance requested prior to scheduling EGD and colonoscopy  Perianal lesions, suspect condyloma  -Dermatology referral  DM type II  ADDENDUM: CARDIAC CLEARANCE RECEIVED BY Eligha Bridegroom CARDIOLOGY NP 07/11/2022 AS FOLLOWS: Primary Cardiologist: Debbe Odea, MD   Chart reviewed as part of pre-operative protocol coverage. Given past medical history and time since last visit, based on ACC/AHA guidelines, Logan Crawford would be at acceptable risk for the planned procedure without further cardiovascular testing.    Patient was advised that if he develops new symptoms prior to surgery to contact our office to arrange a follow-up appointment.  He verbalized understanding.   Ideally, he should continue DAPT without interruption until August 15, 2022. If procedure needs to be done prior to that time, he may hold Plavix for 2-3 days prior to procedure and should resume as soon as hemodynamically stable following the procedure.   Per Bensimhon, Bevelyn Buckles, MD "He is stable for colonoscopy as long as fluid is not markedly elevated when you see him".   Patient cleared for procedures at [facility] per Cathlyn Parsons CRNA.  ADDENDUM: Pulmonary clearance per Dr. Aundria Rud 08/06/2022 received as follows: He is to undergo a low risk procedure (colonosocpy and EGD) and is currently optimized from a pulmonary perspective. Patient's risk for pulmonary complications is low, especially given low risk procedure, but his overall risk is low to moderate given concomitant heart failure. Would recommend utilization of BiPAP with above settings following anesthesia, and use of Duo-nebs for bronchodilation post procedure.         CC:  Donell Beers, FNP

## 2022-07-03 NOTE — Telephone Encounter (Signed)
[facility] Pre-operative Risk Assessment     Request for surgical clearance:     Endoscopy Procedure  What type of surgery is being performed?     EGD  & Colonoscopy  When is this surgery scheduled?     TBD  What type of clearance is required ?   Pharmacy  Are there any medications that need to be held prior to surgery and how long? Plavix & 5 days  Practice name and name of physician performing surgery?      [facility]  What is your office phone and fax number?      Phone- [phone]  Fax- [phone]  Anesthesia type (None, local, MAC, general) ?       [facility]    [facility] [address] [place], [place]  [address] Phone:  [phone]   Fax:  [phone]   Logan Crawford DOB: January 06, 1976 MRN: [id number]  Dear: Dr.Etang :   The patient above is schedule for a Endoscopy & Colonoscopy in the near future under general anesthesia (Propofol).    Please fax or route a note of Medical Clearance to [phone], Attn: Ernesto Zukowski, CMA  Please advise if this patient will require an office visit or further medical work-up before clearance can be given.   Thank you,   [facility]

## 2022-07-07 NOTE — Progress Notes (Signed)
Hello Dr. Gala Romney, from a heart failure perspective, do you assess [name] is an appropriate candidate or EGD/colonoscopy? I wanted to get your input as you continue to monitor this patient closely. I sent a formal cardiac clearance request to include Plavix hold instructions to his primary cardiologist Dr. Azucena Cecil. Appreciate your input, [name]

## 2022-07-08 ENCOUNTER — Encounter: Payer: Medicaid Other | Admitting: *Deleted

## 2022-07-08 ENCOUNTER — Ambulatory Visit: Payer: Medicaid Other | Attending: Otolaryngology

## 2022-07-08 DIAGNOSIS — G4733 Obstructive sleep apnea (adult) (pediatric): Secondary | ICD-10-CM | POA: Insufficient documentation

## 2022-07-08 DIAGNOSIS — Z951 Presence of aortocoronary bypass graft: Secondary | ICD-10-CM

## 2022-07-08 DIAGNOSIS — R0683 Snoring: Secondary | ICD-10-CM | POA: Insufficient documentation

## 2022-07-08 NOTE — Progress Notes (Signed)
Daily Session Note  Patient Details  Name: Logan Crawford MRN: [id number] Date of Birth: 05/17/1975 Referring Provider:   Flowsheet Row Cardiac Rehab from 05/20/2022 in [facility]  Referring Provider Dr. Debbe Odea, MD       Encounter Date: 07/08/2022  Check In:  Session Check In - 07/08/22 1117       Check-In   Supervising physician immediately available to respond to emergencies See telemetry face sheet for immediately available ER MD    Location [facility]    Staff Present Susann Givens, RN BSN;Noah Tickle, BS, Exercise Physiologist;Jessica [place], MA, RCEP, CCRP, CCET    Virtual Visit No    Medication changes reported     No    Fall or balance concerns reported    No    Warm-up and Cool-down Performed on first and last piece of equipment    Resistance Training Performed Yes    VAD Patient? No    PAD/SET Patient? No      Pain Assessment   Currently in Pain? No/denies                Social History   Tobacco Use  Smoking Status Some Days   Packs/day: 0.25   Years: 10.00   Additional pack years: 0.00   Total pack years: 2.50   Types: Cigarettes   Passive exposure: Past  Smokeless Tobacco Never  Tobacco Comments   Smokes 1-2 cigarettes every 2-3 days-05/06/2022    Goals Met:  Independence with exercise equipment Exercise tolerated well No report of concerns or symptoms today Strength training completed today  Goals Unmet:  Not Applicable  Comments: Pt able to follow exercise prescription today without complaint.  Will continue to monitor for progression.    Dr. Bethann Punches is Medical Director for [facility].  Dr. Vida Rigger is Medical Director for [facility] Pulmonary [facility].

## 2022-07-09 ENCOUNTER — Encounter: Payer: Self-pay | Admitting: *Deleted

## 2022-07-09 ENCOUNTER — Telehealth: Payer: Self-pay | Admitting: Nurse Practitioner

## 2022-07-09 ENCOUNTER — Telehealth: Payer: Self-pay

## 2022-07-09 DIAGNOSIS — R140 Abdominal distension (gaseous): Secondary | ICD-10-CM

## 2022-07-09 DIAGNOSIS — K219 Gastro-esophageal reflux disease without esophagitis: Secondary | ICD-10-CM

## 2022-07-09 DIAGNOSIS — K625 Hemorrhage of anus and rectum: Secondary | ICD-10-CM

## 2022-07-09 DIAGNOSIS — Z951 Presence of aortocoronary bypass graft: Secondary | ICD-10-CM

## 2022-07-09 DIAGNOSIS — K649 Unspecified hemorrhoids: Secondary | ICD-10-CM

## 2022-07-09 NOTE — Progress Notes (Signed)
Cardiac Individual Treatment Plan  Patient Details  Name: Logan Crawford MRN: [id number] Date of Birth: 15-Mar-1975 Referring Provider:   Flowsheet Row Cardiac Rehab from 05/20/2022 in [facility] Cardiac and Pulmonary Rehab  Referring Provider Dr. Debbe Odea, MD       Initial Encounter Date:  Flowsheet Row Cardiac Rehab from 05/20/2022 in [facility] Cardiac and Pulmonary Rehab  Date 05/20/22       Visit Diagnosis: S/P CABG x 3  Patient's Home Medications on Admission:  Current Outpatient Medications:    aspirin EC 81 MG tablet, Take 1 tablet (81 mg total) by mouth daily. Swallow whole., Disp: 90 tablet, Rfl: 3   budesonide-formoterol (SYMBICORT) 80-4.5 MCG/ACT inhaler, Inhale 2 puffs into the lungs every 4 (four) hours as needed., Disp: 3 each, Rfl: 0   clopidogrel (PLAVIX) 75 MG tablet, Take 1 tablet (75 mg total) by mouth daily., Disp: 90 tablet, Rfl: 3   colchicine 0.6 MG tablet, Take 1 tablet (0.6 mg total) by mouth 2 (two) times daily., Disp: 60 tablet, Rfl: 1   empagliflozin (JARDIANCE) 10 MG TABS tablet, Take 10 mg by mouth daily., Disp: , Rfl:    famotidine (PEPCID) 20 MG tablet, Take 1 tablet (20 mg total) by mouth 2 (two) times daily., Disp: 60 tablet, Rfl: 3   furosemide (LASIX) 40 MG tablet, Take 1 tablet (40 mg total) by mouth daily., Disp: 90 tablet, Rfl: 3   gabapentin (NEURONTIN) 100 MG capsule, Take 1 capsule (100 mg total) by mouth 2 (two) times daily., Disp: 60 capsule, Rfl: 2   losartan (COZAAR) 50 MG tablet, Take 1 tablet (50 mg total) by mouth daily., Disp: 90 tablet, Rfl: 3   metoprolol succinate (TOPROL-XL) 25 MG 24 hr tablet, Take 2 tablets (50 mg total) by mouth daily. Stop taking metoprolol if you continue to use cocaine, Disp: 180 tablet, Rfl: 3   nicotine (NICODERM CQ - DOSED IN MG/24 HOURS) 21 mg/24hr patch, Place 1 patch (21 mg total) onto the skin daily., Disp: 28 patch, Rfl: 0   nitroGLYCERIN (NITROSTAT) 0.4 MG SL tablet, Place 1 tablet (0.4 mg total)  under the tongue every 5 (five) minutes as needed for chest pain (if chest pain not resolved after 3 doses, please call 911)., Disp: 25 tablet, Rfl: 2   pantoprazole (PROTONIX) 40 MG tablet, Take 1 tablet (40 mg total) by mouth 2 (two) times daily., Disp: 60 tablet, Rfl: 1   rosuvastatin (CRESTOR) 40 MG tablet, Take 1 tablet (40 mg total) by mouth daily., Disp: 90 tablet, Rfl: 3   spironolactone (ALDACTONE) 25 MG tablet, Take 1 tablet (25 mg total) by mouth daily., Disp: 90 tablet, Rfl: 3   VENTOLIN HFA 108 (90 Base) MCG/ACT inhaler, SMARTSIG:1 Via Inhaler Daily, Disp: , Rfl:   Past Medical History: Past Medical History:  Diagnosis Date   CAD (coronary artery disease)    a. 11/2019 NSTEMI/PCI: LM min irregs, LAD 68m (2.75x22 Resolute Onyx DES), 40d, D2 min irregs, LCX/LPAV min irregs, RCA 60ost, 40p/m, EF 45-50%; b. 11/2020 MV: EF 43%, small area of apical ischemia ->? over processing-->low risk.   Hyperlipidemia    Hypertension    Ischemic cardiomyopathy    a. 11/2019 LV Gram: EF 45-50%; b. 12/2019 Echo: EF 50% w/ mod mid-apical anteroseptal HK, GrI DD, nl RV fxn, mild Ao sclerosis.   Tobacco use 11/25/2019    Tobacco Use: Social History   Tobacco Use  Smoking Status Some Days   Packs/day: 0.25   Years: 10.00  Additional pack years: 0.00   Total pack years: 2.50   Types: Cigarettes   Passive exposure: Past  Smokeless Tobacco Never  Tobacco Comments   Smokes 1-2 cigarettes every 2-3 days-05/06/2022    Labs: Review Flowsheet  More data exists      Latest Ref Rng & Units 02/21/2022 02/22/2022 02/23/2022 04/14/2022 05/02/2022  Labs for ITP Cardiac and Pulmonary Rehab  Cholestrol 0 - 200 mg/dL - - - - 161   LDL (calc) 0 - 99 mg/dL - - - - 46   HDL-C >09 mg/dL - - - - 26   Trlycerides <150 mg/dL - - - - 604   Hemoglobin A1c 4.8 - 5.6 % - - - 5.2  -  O2 Saturation % 62.6  44.6  52.6  44.8  - -     Exercise Target Goals: Exercise Program Goal: Individual exercise prescription  set using results from initial 6 min walk test and THRR while considering  patient's activity barriers and safety.   Exercise Prescription Goal: Initial exercise prescription builds to 30-45 minutes a day of aerobic activity, 2-3 days per week.  Home exercise guidelines will be given to patient during program as part of exercise prescription that the participant will acknowledge.   Education: Aerobic Exercise: - Group verbal and visual presentation on the components of exercise prescription. Introduces F.I.T.T principle from ACSM for exercise prescriptions.  Reviews F.I.T.T. principles of aerobic exercise including progression. Written material given at graduation. Flowsheet Row Cardiac Rehab from 05/20/2022 in [facility] Cardiac and Pulmonary Rehab  Education need identified 05/20/22       Education: Resistance Exercise: - Group verbal and visual presentation on the components of exercise prescription. Introduces F.I.T.T principle from ACSM for exercise prescriptions  Reviews F.I.T.T. principles of resistance exercise including progression. Written material given at graduation.    Education: Exercise & Equipment Safety: - Individual verbal instruction and demonstration of equipment use and safety with use of the equipment. Flowsheet Row Cardiac Rehab from 05/20/2022 in [facility] Cardiac and Pulmonary Rehab  Date 05/20/22  Educator NT  Instruction Review Code 1- Verbalizes Understanding       Education: Exercise Physiology & General Exercise Guidelines: - Group verbal and written instruction with models to review the exercise physiology of the cardiovascular system and associated critical values. Provides general exercise guidelines with specific guidelines to those with heart or lung disease.    Education: Flexibility, Balance, Mind/Body Relaxation: - Group verbal and visual presentation with interactive activity on the components of exercise prescription. Introduces F.I.T.T principle from ACSM  for exercise prescriptions. Reviews F.I.T.T. principles of flexibility and balance exercise training including progression. Also discusses the mind body connection.  Reviews various relaxation techniques to help reduce and manage stress (i.e. Deep breathing, progressive muscle relaxation, and visualization). Balance handout provided to take home. Written material given at graduation.   Activity Barriers & Risk Stratification:  Activity Barriers & Cardiac Risk Stratification - 05/20/22 1429       Activity Barriers & Cardiac Risk Stratification   Activity Barriers Back Problems;Shortness of Breath   Occasional pain in knees, feet, and hands   Cardiac Risk Stratification High             6 Minute Walk:  6 Minute Walk     Row Name 05/20/22 1425         6 Minute Walk   Phase Initial     Distance 1215 feet     Walk Time 6 minutes     #  of Rest Breaks 0     MPH 2.3     METS 3.93     RPE 10     Perceived Dyspnea  1     VO2 Peak 13.74     Symptoms No     Resting HR 95 bpm     Resting BP 124/66     Resting Oxygen Saturation  98 %     Exercise Oxygen Saturation  during 6 min walk 97 %     Max Ex. HR 111 bpm     Max Ex. BP 132/74     2 Minute Post BP 116/72              Oxygen Initial Assessment:   Oxygen Re-Evaluation:   Oxygen Discharge (Final Oxygen Re-Evaluation):   Initial Exercise Prescription:  Initial Exercise Prescription - 05/20/22 1400       Date of Initial Exercise RX and Referring Provider   Date 05/20/22    Referring Provider Dr. Debbe Odea, MD      Oxygen   Maintain Oxygen Saturation 88% or higher      Treadmill   MPH 2    Grade 1    Minutes 15    METs 2.81      NuStep   Level 3    SPM 80    Minutes 15    METs 3.93      Recumbant Elliptical   Level 1.5    RPM 50    Minutes 15    METs 3.93      Prescription Details   Frequency (times per week) 2    Duration Progress to 30 minutes of continuous aerobic without  signs/symptoms of physical distress      Intensity   THRR 40-80% of Max Heartrate 125-156    Ratings of Perceived Exertion 11-13    Perceived Dyspnea 0-4      Progression   Progression Continue to progress workloads to maintain intensity without signs/symptoms of physical distress.      Resistance Training   Training Prescription Yes    Weight 4 lb    Reps 10-15             Perform Capillary Blood Glucose checks as needed.  Exercise Prescription Changes:   Exercise Prescription Changes     Row Name 05/20/22 1400 06/05/22 1400 06/17/22 1500 07/01/22 0800       Response to Exercise   Blood Pressure (Admit) 124/66 112/66 108/60 146/74    Blood Pressure (Exercise) 132/74 144/78 116/68 126/74    Blood Pressure (Exit) 116/72 98/56 106/62 120/70    Heart Rate (Admit) 95 bpm 93 bpm 92 bpm 93 bpm    Heart Rate (Exercise) 111 bpm 113 bpm 110 bpm 110 bpm    Heart Rate (Exit) 98 bpm 97 bpm 87 bpm 94 bpm    Oxygen Saturation (Admit) 98 % -- -- --    Oxygen Saturation (Exercise) 97 % -- -- --    Rating of Perceived Exertion (Exercise) 10 13 15 13     Perceived Dyspnea (Exercise) 1 -- -- --    Symptoms None none none none    Comments Results First two days of exercise -- --    Duration -- Continue with 30 min of aerobic exercise without signs/symptoms of physical distress. Continue with 30 min of aerobic exercise without signs/symptoms of physical distress. Continue with 30 min of aerobic exercise without signs/symptoms of physical distress.    Intensity --  THRR unchanged THRR unchanged THRR unchanged      Progression   Progression -- Continue to progress workloads to maintain intensity without signs/symptoms of physical distress. Continue to progress workloads to maintain intensity without signs/symptoms of physical distress. Continue to progress workloads to maintain intensity without signs/symptoms of physical distress.    Average METs -- 2.79 2.91 3.23      Resistance  Training   Training Prescription -- Yes Yes Yes    Weight -- 4 lb 4 lb 4 lb    Reps -- 10-15 10-15 10-15      Interval Training   Interval Training -- No No No      Treadmill   MPH -- 2 2.1 2.2    Grade -- 1 2 2     Minutes -- 15 15 15     METs -- 2.81 3.19 3.29      NuStep   Level -- 3 7 --    Minutes -- 15 15 --    METs -- 2.7 3.1 --      Recumbant Elliptical   Level -- -- 3 --    Minutes -- -- 15 --    METs -- -- 2.1 --      REL-XR   Level -- -- -- 7    Minutes -- -- -- 15    METs -- -- -- 3.2      Oxygen   Maintain Oxygen Saturation -- 88% or higher 88% or higher 88% or higher             Exercise Comments:   Exercise Comments     Row Name 05/27/22 1115           Exercise Comments First full day of exercise!  Patient was oriented to gym and equipment including functions, settings, policies, and procedures.  Patient's individual exercise prescription and treatment plan were reviewed.  All starting workloads were established based on the results of the 6 minute walk test done at initial orientation visit.  The plan for exercise progression was also introduced and progression will be customized based on patient's performance and goals.                Exercise Goals and Review:   Exercise Goals     Row Name 05/20/22 1430             Exercise Goals   Increase Physical Activity Yes       Intervention Provide advice, education, support and counseling about physical activity/exercise needs.;Develop an individualized exercise prescription for aerobic and resistive training based on initial evaluation findings, risk stratification, comorbidities and participant's personal goals.       Expected Outcomes Short Term: Attend rehab on a regular basis to increase amount of physical activity.;Long Term: Exercising regularly at least 3-5 days a week.;Long Term: Add in home exercise to make exercise part of routine and to increase amount of physical activity.        Increase Strength and Stamina Yes       Intervention Provide advice, education, support and counseling about physical activity/exercise needs.;Develop an individualized exercise prescription for aerobic and resistive training based on initial evaluation findings, risk stratification, comorbidities and participant's personal goals.       Expected Outcomes Short Term: Increase workloads from initial exercise prescription for resistance, speed, and METs.;Short Term: Perform resistance training exercises routinely during rehab and add in resistance training at home;Long Term: Improve cardiorespiratory fitness, muscular endurance and strength as measured by  increased METs and functional capacity ( )       Able to understand and use rate of perceived exertion (RPE) scale Yes       Intervention Provide education and explanation on how to use RPE scale       Expected Outcomes Short Term: Able to use RPE daily in rehab to express subjective intensity level;Long Term:  Able to use RPE to guide intensity level when exercising independently       Able to understand and use Dyspnea scale Yes       Intervention Provide education and explanation on how to use Dyspnea scale       Expected Outcomes Short Term: Able to use Dyspnea scale daily in rehab to express subjective sense of shortness of breath during exertion;Long Term: Able to use Dyspnea scale to guide intensity level when exercising independently       Knowledge and understanding of Target Heart Rate Range (THRR) Yes       Intervention Provide education and explanation of THRR including how the numbers were predicted and where they are located for reference       Expected Outcomes Short Term: Able to state/look up THRR;Long Term: Able to use THRR to govern intensity when exercising independently;Short Term: Able to use daily as guideline for intensity in rehab       Able to check pulse independently Yes       Intervention Provide education and demonstration  on how to check pulse in carotid and radial arteries.;Review the importance of being able to check your own pulse for safety during independent exercise       Expected Outcomes Short Term: Able to explain why pulse checking is important during independent exercise;Long Term: Able to check pulse independently and accurately       Understanding of Exercise Prescription Yes       Intervention Provide education, explanation, and written materials on patient's individual exercise prescription       Expected Outcomes Short Term: Able to explain program exercise prescription;Long Term: Able to explain home exercise prescription to exercise independently                Exercise Goals Re-Evaluation :  Exercise Goals Re-Evaluation     Row Name 05/27/22 1115 06/05/22 1410 06/17/22 1542 06/26/22 1544 07/01/22 0848     Exercise Goal Re-Evaluation   Exercise Goals Review Increase Physical Activity;Able to understand and use rate of perceived exertion (RPE) scale;Knowledge and understanding of Target Heart Rate Range (THRR);Understanding of Exercise Prescription;Increase Strength and Stamina;Able to check pulse independently Increase Physical Activity;Increase Strength and Stamina;Understanding of Exercise Prescription Increase Physical Activity;Increase Strength and Stamina;Understanding of Exercise Prescription Increase Physical Activity;Increase Strength and Stamina;Understanding of Exercise Prescription Increase Physical Activity;Increase Strength and Stamina;Understanding of Exercise Prescription   Comments Reviewed RPE scale, THR and program prescription with pt today.  Pt voiced understanding and was given a copy of goals to take home. Cletis Athens is off to a good start in rehab. He did well with the treadmill at a speed of 2 mph with an incline of 1%. He also worked at level 3 on the T4 nustep during his first two sessions. He has done well with 4 lb hand weights for resistance training as well. We will continue  to monitor his progress in the program. Cletis Athens continues to do well in rehab. He increased to level 7 on the T4 Nustep and up to level 3 on the REL. He increased his incline to  4% on the treadmill for 1 session. He is not quite hitting his THR  but RPEs are staying appropriate. We will continue to monitor. Jonny Ruiz is off to a good start in rehab.  He feels that he is struggling to get here twice a week, but admits that it does get him up and out of bed.  We offered a later class time, but he said that he was goo.  He is not doing much outside of rehab yet, but plans to rejoin [employer] to start working out more. We will review home exercise with him soon.  He is a fan of the sauna at [facility]! Jonny Ruiz has only attended rehab twice since the last review. He increased his overall average MET level to 3.23 METs. He increased his speed on the treadmill to 2.2 mph while maintaining an incline of 2% as well.  He also improved to level 7 on the XR. We will continue to monitor his progress in the program.   Expected Outcomes Short: Use RPE daily to regulate intensity.  Long: Follow program prescription in THR. Short: Continue to follow current exercise prescription and progressively increase workloads. Long: Continue to improve strength and stamina. Short: Slowly progress incline back up beyond baseline of 2% Long: Continue to increase overall MET level and stamina Short: Review home exercise guidelines Long: Add in more exercise on off days Short: Continue to progressively increase treadmill workload. Long: Continue to improve strength and stamina.            Discharge Exercise Prescription (Final Exercise Prescription Changes):  Exercise Prescription Changes - 07/01/22 0800       Response to Exercise   Blood Pressure (Admit) 146/74    Blood Pressure (Exercise) 126/74    Blood Pressure (Exit) 120/70    Heart Rate (Admit) 93 bpm    Heart Rate (Exercise) 110 bpm    Heart Rate (Exit) 94 bpm    Rating of Perceived  Exertion (Exercise) 13    Symptoms none    Duration Continue with 30 min of aerobic exercise without signs/symptoms of physical distress.    Intensity THRR unchanged      Progression   Progression Continue to progress workloads to maintain intensity without signs/symptoms of physical distress.    Average METs 3.23      Resistance Training   Training Prescription Yes    Weight 4 lb    Reps 10-15      Interval Training   Interval Training No      Treadmill   MPH 2.2    Grade 2    Minutes 15    METs 3.29      REL-XR   Level 7    Minutes 15    METs 3.2      Oxygen   Maintain Oxygen Saturation 88% or higher             Nutrition:  Target Goals: Understanding of nutrition guidelines, daily intake of sodium 1500mg , cholesterol 200mg , calories 30% from fat and 7% or less from saturated fats, daily to have 5 or more servings of fruits and vegetables.  Education: All About Nutrition: -Group instruction provided by verbal, written material, interactive activities, discussions, models, and posters to present general guidelines for heart healthy nutrition including fat, fiber, MyPlate, the role of sodium in heart healthy nutrition, utilization of the nutrition label, and utilization of this knowledge for meal planning. Follow up email sent as well. Written material given at graduation. Flowsheet  Row Cardiac Rehab from 05/20/2022 in [facility] Cardiac and Pulmonary Rehab  Education need identified 05/20/22       Biometrics:  Pre Biometrics - 05/20/22 1432       Pre Biometrics   Height 6' (1.829 m)    Weight 226 lb 12.8 oz (102.9 kg)    Waist Circumference 40.5 inches    Hip Circumference 43 inches    Waist to Hip Ratio 0.94 %    BMI (Calculated) 30.75    Single Leg Stand 30 seconds              Nutrition Therapy Plan and Nutrition Goals:  Nutrition Therapy & Goals - 05/20/22 1131       Nutrition Therapy   Diet Heart healthy, low Na    Drug/Food Interactions  Statins/Certain Fruits    Protein (specify units) 65g   CKD stg 3: 0.6g/kg/d   Fiber 30 grams    Whole Grain Foods 3 servings    Saturated Fats 16 max. grams    Fruits and Vegetables 8 servings/day    Sodium 2 grams      Personal Nutrition Goals   Nutrition Goal ST: review handouts, focus on lean meats, limit creamy or cheese sauces or add ons, and add fruit and vegetable servings to meals when going out LT: limit Na <2g, limit saturated fat <16g/day, aim for at least 30g of fiber/day    Comments 47 y.o. M admitted to cardiac rehab s/p CABG x 3. PMHx includes HTN, NSTEMI (2021 and 2023), CAD, chronic diastolic CHF, GERD, Stg 3a CKD, HLD, polysubstance abuse, former tobacco use, hypercalcemia. Medications reviewed symbicort, jardiance, pepcid, furosemide, nicotine, pantoprazole, rosuvastatin. B: sausage egg and cheese on toast or raisin bran L: chik-fil-a or grilled chicken or ribeye steak sandwich - he has been trying to limit [employer] burger D: [place] food 2x/week, sushi, salmon S; chips or candy bars or pickles Drinks: water, gatorade (grape - regular) 2-3x/day and sweet tea 3-4x/day. He reports not cooking at home most of the time and would like to focus on modifications eating out as he does not want to cook - discussed some options to put together at home too like rotisserie chicken, microwave grains like brown rice, and frozen vegetables. Also discussed general heart healthy eating and considerations while eating out.      Intervention Plan   Intervention Prescribe, educate and counsel regarding individualized specific dietary modifications aiming towards targeted core components such as weight, hypertension, lipid management, diabetes, heart failure and other comorbidities.;Nutrition handout(s) given to patient.    Expected Outcomes Short Term Goal: Understand basic principles of dietary content, such as calories, fat, sodium, cholesterol and nutrients.;Short Term Goal: A plan has been  developed with personal nutrition goals set during dietitian appointment.;Long Term Goal: Adherence to prescribed nutrition plan.             Nutrition Assessments:  MEDIFICTS Score Key: ?70 Need to make dietary changes  40-70 Heart Healthy Diet ? 40 Therapeutic Level Cholesterol Diet  Flowsheet Row Cardiac Rehab from 05/20/2022 in [facility] Cardiac and Pulmonary Rehab  Picture Your Plate Total Score on Admission 50      Picture Your Plate Scores: <16 Unhealthy dietary pattern with much room for improvement. 41-50 Dietary pattern unlikely to meet recommendations for good health and room for improvement. 51-60 More healthful dietary pattern, with some room for improvement.  >60 Healthy dietary pattern, although there may be some specific behaviors that could be improved.  Nutrition Goals Re-Evaluation:  Nutrition Goals Re-Evaluation     Row Name 06/26/22 1549             Goals   Nutrition Goal ST: review handouts, focus on lean meats, limit creamy or cheese sauces or add ons, and add fruit and vegetable servings to meals when going out LT: limit Na <2g, limit saturated fat <16g/day, aim for at least 30g of fiber/day       Comment John was able to meet with dietitian and they talked about picking healthier choices when going out to eat.  John does not cook at home and finds it difficult.  Hei is still eating a lot of sodium from mulitple meals out.  He is trying to make better choices.  Trying to work on his cooking, we talked about trying pre-prepped meals that are easy to heat up (Not TV dinner, but store made kits).  He was open to trying meal kits at [employer].       Expected Outcome Short: Try premade meal kits at [employer] Long: Continue to reduce meals out                Nutrition Goals Discharge (Final Nutrition Goals Re-Evaluation):  Nutrition Goals Re-Evaluation - 06/26/22 1549       Goals   Nutrition Goal ST: review handouts, focus on lean meats, limit creamy or  cheese sauces or add ons, and add fruit and vegetable servings to meals when going out LT: limit Na <2g, limit saturated fat <16g/day, aim for at least 30g of fiber/day    Comment John was able to meet with dietitian and they talked about picking healthier choices when going out to eat.  John does not cook at home and finds it difficult.  Hei is still eating a lot of sodium from mulitple meals out.  He is trying to make better choices.  Trying to work on his cooking, we talked about trying pre-prepped meals that are easy to heat up (Not TV dinner, but store made kits).  He was open to trying meal kits at [employer].    Expected Outcome Short: Try premade meal kits at [employer] Long: Continue to reduce meals out             Psychosocial: Target Goals: Acknowledge presence or absence of significant depression and/or stress, maximize coping skills, provide positive support system. Participant is able to verbalize types and ability to use techniques and skills needed for reducing stress and depression.   Education: Stress, Anxiety, and Depression - Group verbal and visual presentation to define topics covered.  Reviews how body is impacted by stress, anxiety, and depression.  Also discusses healthy ways to reduce stress and to treat/manage anxiety and depression.  Written material given at graduation.   Education: Sleep Hygiene -Provides group verbal and written instruction about how sleep can affect your health.  Define sleep hygiene, discuss sleep cycles and impact of sleep habits. Review good sleep hygiene tips.    Initial Review & Psychosocial Screening:  Initial Psych Review & Screening - 05/13/22 1408       Initial Review   Current issues with Current Sleep Concerns      Family Dynamics   Good Support System? Yes      Barriers   Psychosocial barriers to participate in program There are no identifiable barriers or psychosocial needs.;The patient should benefit from training in stress  management and relaxation.      Screening Interventions  Interventions Encouraged to exercise;Provide feedback about the scores to participant;To provide support and resources with identified psychosocial needs    Expected Outcomes Short Term goal: Utilizing psychosocial counselor, staff and physician to assist with identification of specific Stressors or current issues interfering with healing process. Setting desired goal for each stressor or current issue identified.;Long Term Goal: Stressors or current issues are controlled or eliminated.;Short Term goal: Identification and review with participant of any Quality of Life or Depression concerns found by scoring the questionnaire.;Long Term goal: The participant improves quality of Life and PHQ9 Scores as seen by post scores and/or verbalization of changes             Quality of Life Scores:   Quality of Life - 05/20/22 1411       Quality of Life   Select Quality of Life      Quality of Life Scores   Health/Function Pre 14.17 %    Socioeconomic Pre 15.07 %    Psych/Spiritual Pre 17.36 %    Family Pre 18.3 %    GLOBAL Pre 15.62 %            Scores of 19 and below usually indicate a poorer quality of life in these areas.  A difference of  2-3 points is a clinically meaningful difference.  A difference of 2-3 points in the total score of the Quality of Life Index has been associated with significant improvement in overall quality of life, self-image, physical symptoms, and general health in studies assessing change in quality of life.  PHQ-9: Review Flowsheet       05/20/2022 03/14/2022  Depression screen PHQ 2/9  Decreased Interest 0 0  Down, Depressed, Hopeless 0 0  PHQ - 2 Score 0 0  Altered sleeping 0 -  Tired, decreased energy 2 -  Change in appetite 0 -  Feeling bad or failure about yourself  0 -  Trouble concentrating 0 -  Moving slowly or fidgety/restless 0 -  Suicidal thoughts 0 -  PHQ-9 Score 2 -  Difficult  doing work/chores Not difficult at all -   Interpretation of Total Score  Total Score Depression Severity:  1-4 = Minimal depression, 5-9 = Mild depression, 10-14 = Moderate depression, 15-19 = Moderately severe depression, 20-27 = Severe depression   Psychosocial Evaluation and Intervention:  Psychosocial Evaluation - 05/13/22 1412       Psychosocial Evaluation & Interventions   Interventions Encouraged to exercise with the program and follow exercise prescription    Comments Giancarlos 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 He is wearing a nicotine patch on and off and is trying to quit completly. He states he doesn't have really any stress concerns besides when he is in pain it makes things more difficult. He does report sleep concerns, that it is hard to stay asleep. He also needs a sleep study to evaluate for sleep apnea. He really enjoys playing pool and is back to playing since his CABG. He wants to work on exercising more and understanding how to live a heart healthy lifestyle.    Expected Outcomes Short: attend cardiac rehab for education and exercise. Long: develop and maintain positive self care habits.    Continue Psychosocial Services  Follow up required by staff             Psychosocial Re-Evaluation:  Psychosocial Re-Evaluation     Row Name 06/26/22 1546  Psychosocial Re-Evaluation   Current issues with Current Stress Concerns;Current Sleep Concerns       Comments Jonny Ruiz is doing well in rehab.  He is working on getting set up for a second sleep study on 5/14 as his CPAP is not working well for him.  He is only able to sleep for around 2 hours before waking routines.  He has also noted some swelling in his stomach and gained 20lb in one week.  He is also working with the doctor on this.  He is still working on quitting smoking and says stress is a contributing factor to not being able to drop last two.  We  talked about trying to replace those with something else to help.  He was also encouraged to exercise more to help boost sleep and mood.       Expected Outcomes Short: Work with docotrs on sleep study and stomach Long: Add in more exercise mental boost       Interventions Stress management education;Encouraged to attend Cardiac Rehabilitation for the exercise       Continue Psychosocial Services  Follow up required by staff                Psychosocial Discharge (Final Psychosocial Re-Evaluation):  Psychosocial Re-Evaluation - 06/26/22 1546       Psychosocial Re-Evaluation   Current issues with Current Stress Concerns;Current Sleep Concerns    Comments Jonny Ruiz is doing well in rehab.  He is working on getting set up for a second sleep study on 5/14 as his CPAP is not working well for him.  He is only able to sleep for around 2 hours before waking routines.  He has also noted some swelling in his stomach and gained 20lb in one week.  He is also working with the doctor on this.  He is still working on quitting smoking and says stress is a contributing factor to not being able to drop last two.  We talked about trying to replace those with something else to help.  He was also encouraged to exercise more to help boost sleep and mood.    Expected Outcomes Short: Work with docotrs on sleep study and stomach Long: Add in more exercise mental boost    Interventions Stress management education;Encouraged to attend Cardiac Rehabilitation for the exercise    Continue Psychosocial Services  Follow up required by staff             Vocational Rehabilitation: Provide vocational rehab assistance to qualifying candidates.   Vocational Rehab Evaluation & Intervention:  Vocational Rehab - 05/13/22 1408       Initial Vocational Rehab Evaluation & Intervention   Assessment shows need for Vocational Rehabilitation No             Education: Education Goals: Education classes will be provided on a  variety of topics geared toward better understanding of heart health and risk factor modification. Participant will state understanding/return demonstration of topics presented as noted by education test scores.  Learning Barriers/Preferences:  Learning Barriers/Preferences - 05/13/22 1408       Learning Barriers/Preferences   Learning Barriers None    Learning Preferences None             General Cardiac Education Topics:  AED/CPR: - Group verbal and written instruction with the use of models to demonstrate the basic use of the AED with the basic ABC's of resuscitation.   Anatomy and Cardiac Procedures: - Group verbal and visual presentation  and models provide information about basic cardiac anatomy and function. Reviews the testing methods done to diagnose heart disease and the outcomes of the test results. Describes the treatment choices: Medical Management, Angioplasty, or Coronary Bypass Surgery for treating various heart conditions including Myocardial Infarction, Angina, Valve Disease, and Cardiac Arrhythmias.  Written material given at graduation. Flowsheet Row Cardiac Rehab from 05/20/2022 in [facility] Cardiac and Pulmonary Rehab  Education need identified 05/20/22       Medication Safety: - Group verbal and visual instruction to review commonly prescribed medications for heart and lung disease. Reviews the medication, class of the drug, and side effects. Includes the steps to properly store meds and maintain the prescription regimen.  Written material given at graduation.   Intimacy: - Group verbal instruction through game format to discuss how heart and lung disease can affect sexual intimacy. Written material given at graduation..   Know Your Numbers and Heart Failure: - Group verbal and visual instruction to discuss disease risk factors for cardiac and pulmonary disease and treatment options.  Reviews associated critical values for Overweight/Obesity, Hypertension,  Cholesterol, and Diabetes.  Discusses basics of heart failure: signs/symptoms and treatments.  Introduces Heart Failure Zone chart for action plan for heart failure.  Written material given at graduation.   Infection Prevention: - Provides verbal and written material to individual with discussion of infection control including proper hand washing and proper equipment cleaning during exercise session. Flowsheet Row Cardiac Rehab from 05/20/2022 in [facility] Cardiac and Pulmonary Rehab  Date 05/20/22  Educator NT  Instruction Review Code 1- Verbalizes Understanding       Falls Prevention: - Provides verbal and written material to individual with discussion of falls prevention and safety. Flowsheet Row Cardiac Rehab from 05/20/2022 in [facility] Cardiac and Pulmonary Rehab  Date 05/20/22  Educator NT  Instruction Review Code 1- Verbalizes Understanding       Other: -Provides group and verbal instruction on various topics (see comments)   Knowledge Questionnaire Score:  Knowledge Questionnaire Score - 05/20/22 1411       Knowledge Questionnaire Score   Pre Score 19/26             Core Components/Risk Factors/Patient Goals at Admission:  Personal Goals and Risk Factors at Admission - 05/13/22 1406       Core Components/Risk Factors/Patient Goals on Admission   Tobacco Cessation Yes   wearing patch   Number of packs per day 0    Expected Outcomes Long Term: Complete abstinence from all tobacco products for at least 12 months from quit date.    Hypertension Yes    Intervention Provide education on lifestyle modifcations including regular physical activity/exercise, weight management, moderate sodium restriction and increased consumption of fresh fruit, vegetables, and low fat dairy, alcohol moderation, and smoking cessation.;Monitor prescription use compliance.    Expected Outcomes Short Term: Continued assessment and intervention until BP is < 140/46mm HG in hypertensive participants.  < 130/41mm HG in hypertensive participants with diabetes, heart failure or chronic kidney disease.;Long Term: Maintenance of blood pressure at goal levels.             Education:Diabetes - Individual verbal and written instruction to review signs/symptoms of diabetes, desired ranges of glucose level fasting, after meals and with exercise. Acknowledge that pre and post exercise glucose checks will be done for 3 sessions at entry of program.   Core Components/Risk Factors/Patient Goals Review:   Goals and Risk Factor Review     Row Name 06/26/22  1552             Core Components/Risk Factors/Patient Goals Review   Personal Goals Review Weight Management/Obesity;Tobacco Cessation;Hypertension       Review Jonny Ruiz is doing well in rehab.  He is working on weigh loss.  His weight went up 20 lb in one week and he went to doctor and they did xrays and red vest and could not find fluid.  He is still working with them and on lasix to help.  His pressures are doing well, but slightly elevated and could be related to his meals out. He is also still smoking, but down to 2-3 cigs a day.  When we talked about when they are, they are his usually after meal cigarettes.  He had used patches before and the nicotine makes him want to smoke more.  Thus, we talked about replacing his after meal cigarette with a different activity like chewing a straw or gum to help kick the habit.  He was open to trying that out.       Expected Outcomes Short: Try out gum or straw for after meals Long: conitnue to work on cessation and weight loss.                Core Components/Risk Factors/Patient Goals at Discharge (Final Review):   Goals and Risk Factor Review - 06/26/22 1552       Core Components/Risk Factors/Patient Goals Review   Personal Goals Review Weight Management/Obesity;Tobacco Cessation;Hypertension    Review Jonny Ruiz is doing well in rehab.  He is working on weigh loss.  His weight went up 20 lb in one week  and he went to doctor and they did xrays and red vest and could not find fluid.  He is still working with them and on lasix to help.  His pressures are doing well, but slightly elevated and could be related to his meals out. He is also still smoking, but down to 2-3 cigs a day.  When we talked about when they are, they are his usually after meal cigarettes.  He had used patches before and the nicotine makes him want to smoke more.  Thus, we talked about replacing his after meal cigarette with a different activity like chewing a straw or gum to help kick the habit.  He was open to trying that out.    Expected Outcomes Short: Try out gum or straw for after meals Long: conitnue to work on cessation and weight loss.             ITP Comments:  ITP Comments     Row Name 05/13/22 1416 05/20/22 1402 05/27/22 1115 06/11/22 1156 07/09/22 0843   ITP Comments Initial phone call completed. Diagnosis can be found in [facility] 12/25. EP Orientation scheduled for Tuesday 3/26 at 9:30.  Thinh has recently quit tobacco use within the last 6 months. Intervention for relapse prevention was provided at the initial medical review. He was encouraged to continue to with tobacco cessation and was provided information on relapse prevention. Patient received information about combination therapy, tobacco cessation classes, quit line, and quit smoking apps in case of a relapse. Patient demonstrated understanding of this material.Staff will continue to provide encouragement and follow up with the patient throughout the program. Completed and gym orientation. Initial ITP created and sent for review to Dr. Bethann Punches, Medical Director. First full day of exercise!  Patient was oriented to gym and equipment including functions, settings, policies, and procedures.  Patient's individual exercise prescription and treatment plan were reviewed.  All starting workloads were established based on the results of the 6 minute walk test done  at initial orientation visit.  The plan for exercise progression was also introduced and progression will be customized based on patient's performance and goals. 30 day review completed. ITP sent to Dr. Bethann Punches, Medical Director of Cardiac Rehab. Continue with ITP unless changes are made by physician.   Pt still new to program 30 Day review completed. Medical Director ITP review done, changes made as directed, and signed approval by Medical Director.            Comments:

## 2022-07-09 NOTE — Telephone Encounter (Signed)
[facility] [address] [place], [place]  [address] Phone:  [phone]   Fax:  [phone]   DIONTA SYLTE DOB: September 03, 1975 MRN: [id number]  Dear: Dr.Etang:   The patient above is schedule for a Colonoscopy & Upper endoscopy in the near future under general anesthesia (Propofol).    Please fax or route a note of Cardiac Clearance to [phone], Attn: Yeiden Frenkel, CMA  Please advise if this patient will require an office visit or further medical work-up before clearance can be given.   Thank you,    [facility]

## 2022-07-09 NOTE — Telephone Encounter (Signed)
DD, refer to office visit 07/03/2022.   We did received Plavix hold x 5 day clearance from [facility] cardiology PA-C 07/03/2022 but we don't have cardiac clearance.   Please contact the patient's primary cardiologist Dr. Debbe Odea to request an official cardiac clearance which is required prior to scheduling an EGD and colonoscopy.   Cathlyn Parsons CRNA verified patient is a candidate for EGD/colonoscopy in [facility].   Once we have cardiac clearance from Dr. Azucena Cecil, pls schedule patient for EGD/colonoscopy at [facility] with Dr. Barron Alvine.

## 2022-07-09 NOTE — Progress Notes (Signed)
Dr. Barron Alvine, FYI response per Dr. Gala Romney as follows:  Bensimhon, Bevelyn Buckles, MD  Arnaldo Natal, NP He is stable for colonoscopy as long as fluid is not markedly elevated when you see him  Patient cleared for procedures at [facility] per Cathlyn Parsons CRNA.  EGD/colonoscopy will be scheduled after cardiac clearance received from his primary cardiologist

## 2022-07-10 ENCOUNTER — Encounter: Payer: Medicaid Other | Admitting: *Deleted

## 2022-07-10 DIAGNOSIS — Z951 Presence of aortocoronary bypass graft: Secondary | ICD-10-CM | POA: Diagnosis not present

## 2022-07-10 NOTE — Progress Notes (Signed)
Daily Session Note  Patient Details  Name: Logan Crawford MRN: [id number] Date of Birth: June 06, 1975 Referring Provider:   Flowsheet Row Cardiac Rehab from 05/20/2022 in [facility] Cardiac and Pulmonary Rehab  Referring Provider Dr. Debbe Odea, MD       Encounter Date: 07/10/2022  Check In:  Session Check In - 07/10/22 1112       Check-In   Supervising physician immediately available to respond to emergencies See telemetry face sheet for immediately available ER MD    Location [facility]-Cardiac & Pulmonary Rehab    Staff Present Lanny Hurst, RN, ADN;Meredith Jewel Baize, RN BSN;Jessica Juanetta Gosling, MA, RCEP, CCRP, Zackery Barefoot, MS, ACSM CEP, Exercise Physiologist    Virtual Visit No    Medication changes reported     No    Fall or balance concerns reported    No    Warm-up and Cool-down Performed on first and last piece of equipment    Resistance Training Performed Yes    VAD Patient? No    PAD/SET Patient? No      Pain Assessment   Currently in Pain? No/denies                Social History   Tobacco Use  Smoking Status Some Days   Packs/day: 0.25   Years: 10.00   Additional pack years: 0.00   Total pack years: 2.50   Types: Cigarettes   Passive exposure: Past  Smokeless Tobacco Never  Tobacco Comments   Smokes 1-2 cigarettes every 2-3 days-05/06/2022    Goals Met:  Independence with exercise equipment Exercise tolerated well No report of concerns or symptoms today Strength training completed today  Goals Unmet:  Not Applicable  Comments: Pt able to follow exercise prescription today without complaint.  Will continue to monitor for progression.    Dr. Bethann Punches is Medical Director for [facility] Cardiac Rehabilitation.  Dr. Vida Rigger is Medical Director for [facility] Pulmonary Rehabilitation.

## 2022-07-10 NOTE — Telephone Encounter (Signed)
Dr. Gala Romney, patient recently seen by you on 06/20/2022 now seeking cardiac risk assessment for EGD and colonoscopy. Could you please comment on cardiac risk for upcoming procedures? Request to hold DAPT was not included, however assuming this was an oversight, what are your thoughts on holding Plavix and remaining on aspirin after 6 months of therapy (August 15 2022)?  Please route your response to p cv div preop.  Thank you, Levi Aland, NP-C 07/10/2022, 11:38 AM [address], [address] Office [phone] Fax [phone]

## 2022-07-11 MED ORDER — CLENPIQ 10-3.5-12 MG-GM -GM/160ML PO SOLN: 1.0000 | ORAL | 0 refills | Status: DC

## 2022-07-11 NOTE — Telephone Encounter (Signed)
Contacted pt & pt is aware of being cleared from cardiac standpoint & to hold his Plavix for 5 days. Pt was scheduled for 08/20/22 at 2:30 pm. Prep was sent to [facility] in [place] & instructions were mailed.

## 2022-07-11 NOTE — Telephone Encounter (Signed)
   Primary Cardiologist: Debbe Odea, MD  Chart reviewed as part of pre-operative protocol coverage. Given past medical history and time since last visit, based on ACC/AHA guidelines, Logan Crawford would be at acceptable risk for the planned procedure without further cardiovascular testing.   Patient was advised that if he develops new symptoms prior to surgery to contact our office to arrange a follow-up appointment.  He verbalized understanding.  Ideally, he should continue DAPT without interruption until August 15, 2022. If procedure needs to be done prior to that time, he may hold Plavix for 2-3 days prior to procedure and should resume as soon as hemodynamically stable following the procedure.   I will route this recommendation to the requesting party via Epic fax function and remove from pre-op pool.  Please call with questions.  Levi Aland, NP-C  07/11/2022, 8:25 AM [address], [address] Office [phone] Fax [phone]

## 2022-07-11 NOTE — Addendum Note (Signed)
Addended by: Rise Paganini on: 07/11/2022 02:36 PM   Modules accepted: Orders

## 2022-07-17 ENCOUNTER — Encounter: Payer: Medicaid Other | Admitting: *Deleted

## 2022-07-22 ENCOUNTER — Telehealth (HOSPITAL_BASED_OUTPATIENT_CLINIC_OR_DEPARTMENT_OTHER): Payer: Medicaid Other | Admitting: Pulmonary Disease

## 2022-07-22 ENCOUNTER — Other Ambulatory Visit: Payer: Self-pay | Admitting: Student in an Organized Health Care Education/Training Program

## 2022-07-22 DIAGNOSIS — G4733 Obstructive sleep apnea (adult) (pediatric): Secondary | ICD-10-CM

## 2022-07-22 NOTE — Telephone Encounter (Signed)
Will await Dr. Dgayli's response.  

## 2022-07-22 NOTE — Telephone Encounter (Signed)
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

## 2022-07-23 ENCOUNTER — Telehealth: Payer: Self-pay | Admitting: Nurse Practitioner

## 2022-07-23 NOTE — Progress Notes (Signed)
Agree with the assessment and plan as outlined by Colleen Kennedy-Smith, NP.   Yuli Lanigan, DO, FACG [facility]   

## 2022-07-23 NOTE — Telephone Encounter (Signed)
Patient is aware of results and voiced his understanding.  Nothing further needed.  

## 2022-07-23 NOTE — Telephone Encounter (Signed)
Logan Crawford, refer to office visit pls contact pulmonologist Dr. Vassie Loll and request pulmonary clearance prior to patient proceeding with EGD/colonoscopy 08/20/2022. Patient had split sleep study which showed severe OSA.  THX.

## 2022-07-24 ENCOUNTER — Encounter: Payer: Medicaid Other | Admitting: *Deleted

## 2022-07-24 NOTE — Telephone Encounter (Signed)
Clearance letter faxed electronically to Dr. Vassie Loll.

## 2022-07-28 ENCOUNTER — Other Ambulatory Visit: Payer: Self-pay

## 2022-07-28 ENCOUNTER — Other Ambulatory Visit: Payer: Self-pay | Admitting: Physician Assistant

## 2022-07-29 ENCOUNTER — Encounter: Payer: Medicaid Other | Attending: Cardiology | Admitting: *Deleted

## 2022-07-29 DIAGNOSIS — Z951 Presence of aortocoronary bypass graft: Secondary | ICD-10-CM | POA: Diagnosis present

## 2022-07-29 NOTE — Telephone Encounter (Signed)
Faxed hard copy to Dr. Vassie Loll at [phone].

## 2022-07-29 NOTE — Progress Notes (Signed)
Daily Session Note  Patient Details  Name: PARMOD LEGORE MRN: [id number] Date of Birth: 03-12-1975 Referring Provider:   Flowsheet Row Cardiac Rehab from 05/20/2022 in [facility] Cardiac and Pulmonary Rehab  Referring Provider Dr. Debbe Odea, MD       Encounter Date: 07/29/2022  Check In:  Session Check In - 07/29/22 1200       Check-In   Supervising physician immediately available to respond to emergencies See telemetry face sheet for immediately available ER MD    Location [facility]-Cardiac & Pulmonary Rehab    Staff Present Cyndia Diver, RN, BSN, Damita Dunnings, MA, RCEP, CCRP, CCET;Susanne Bice, RN, BSN, CCRP    Virtual Visit No    Medication changes reported     No    Fall or balance concerns reported    No    Tobacco Cessation No Change    Warm-up and Cool-down Performed on first and last piece of equipment    Resistance Training Performed Yes    VAD Patient? No      Pain Assessment   Currently in Pain? No/denies                Social History   Tobacco Use  Smoking Status Some Days   Packs/day: 0.25   Years: 10.00   Additional pack years: 0.00   Total pack years: 2.50   Types: Cigarettes   Passive exposure: Past  Smokeless Tobacco Never  Tobacco Comments   Smokes 1-2 cigarettes every 2-3 days-05/06/2022    Goals Met:  Independence with exercise equipment Exercise tolerated well No report of concerns or symptoms today  Goals Unmet:  Not Applicable  Comments: Pt able to follow exercise prescription today without complaint.  Will continue to monitor for progression.    Dr. Bethann Punches is Medical Director for [facility] Cardiac Rehabilitation.  Dr. Vida Rigger is Medical Director for [facility] Pulmonary Rehabilitation.

## 2022-07-30 ENCOUNTER — Ambulatory Visit: Payer: Medicaid Other | Admitting: Cardiology

## 2022-07-31 ENCOUNTER — Ambulatory Visit: Payer: Medicaid Other | Attending: Internal Medicine | Admitting: Internal Medicine

## 2022-07-31 ENCOUNTER — Telehealth: Payer: Self-pay | Admitting: Student in an Organized Health Care Education/Training Program

## 2022-07-31 ENCOUNTER — Encounter: Payer: Medicaid Other | Admitting: *Deleted

## 2022-07-31 VITALS — BP 118/86 | HR 77 | Ht 72.0 in | Wt 239.0 lb

## 2022-07-31 DIAGNOSIS — N183 Chronic kidney disease, stage 3 unspecified: Secondary | ICD-10-CM | POA: Diagnosis not present

## 2022-07-31 DIAGNOSIS — G4733 Obstructive sleep apnea (adult) (pediatric): Secondary | ICD-10-CM | POA: Diagnosis not present

## 2022-07-31 DIAGNOSIS — Z720 Tobacco use: Secondary | ICD-10-CM

## 2022-07-31 DIAGNOSIS — I5032 Chronic diastolic (congestive) heart failure: Secondary | ICD-10-CM | POA: Diagnosis not present

## 2022-07-31 DIAGNOSIS — Z951 Presence of aortocoronary bypass graft: Secondary | ICD-10-CM | POA: Diagnosis not present

## 2022-07-31 NOTE — Telephone Encounter (Signed)
Patient would like results of sleep study. Patient phone number is [phone].

## 2022-07-31 NOTE — Telephone Encounter (Signed)
Please refer to 07/22/2022 phone note.  Spoke to patient. He is requesting update on bipap order. I have provided him with [employer]'s contact number. He voiced his understanding.  Nothing further needed.

## 2022-07-31 NOTE — Progress Notes (Signed)
Advanced Heart Failure Clinic Note   Referring Physician: Dr. Azucena Cecil PCP: Donell Beers, FNP PCP-Cardiologist: Debbe Odea, MD   HPI:  [name] is a 47 y.o. male with HTN, polysubstance abuse, tobacco use disorder, CAD s/p PCI in 04/2021, CABG in 12/23.   Hospitalized from 9/23 with NSTEMI. Cath on 9/25 showing significant two-vessel coronary artery disease which was  treated medically and on DAPT. Readmitted 12/23 with NSTEMI.  Cath 02/13/22 which showed recurrent in-stent restenosis in the LAD in the setting of continued cocaine use and poor compliance with antiplatelet medications.  Patient also has significant progression of the RCA disease.  Pre-op EF normal.   Underwent CAG 02/19/22  (LIMA - Diag, SVG - LAD, SVG - RCA). Operative course complicated by severe hyperkalemia (prior to cardiopulmonary bypass with question of malignant hyperthermia syndrome - received dantrolene). Early post-op course was notable for shock physiology and AKI. Required IABP and multiple gtts which were eventually weaned ECHO EF 55-60%. Also had some mild sternal drainage treated with abx Discharged 03/01/22.   I saw him last month and was volume overloaded with large left pleural effusion. Underwent tap with IR on 03/10/22 with 750cc out. Started on Furoscix  Saw General Cards 1/22 with concern for recurrent effusion. Stat CXR with trace effusion.   Admitted in 3/24 with CP. Felt to have pericarditis. Had mild volume overload. Echo 05/01/22  EF 55%  Seen earlier this week in Pulmonary Clinic and weight up 5 pounds so referred back top HF Clinic to reassess  Sleep study 5/24  showed severe  OSA with AHI 52/ hr  Says he is getting better slowly. Able to do more. Going to CR. Main issue is ab pain and bloating. Scheduled for EGD 6/26. No CP. Occasional SOB. Taking all meds.    Past Medical History:  Diagnosis Date   CAD (coronary artery disease)    a. 11/2019 NSTEMI/PCI: LM min irregs, LAD  68m (2.75x22 Resolute Onyx DES), 40d, D2 min irregs, LCX/LPAV min irregs, RCA 60ost, 40p/m, EF 45-50%; b. 11/2020 MV: EF 43%, small area of apical ischemia ->? over processing-->low risk.   Hyperlipidemia    Hypertension    Ischemic cardiomyopathy    a. 11/2019 LV Gram: EF 45-50%; b. 12/2019 Echo: EF 50% w/ mod mid-apical anteroseptal HK, GrI DD, nl RV fxn, mild Ao sclerosis.   Tobacco use 11/25/2019    Current Outpatient Medications  Medication Sig Dispense Refill   aspirin EC 81 MG tablet Take 1 tablet (81 mg total) by mouth daily. Swallow whole. 90 tablet 3   budesonide-formoterol (SYMBICORT) 80-4.5 MCG/ACT inhaler Inhale 2 puffs into the lungs every 4 (four) hours as needed. 3 each 0   clopidogrel (PLAVIX) 75 MG tablet Take 1 tablet (75 mg total) by mouth daily. 90 tablet 3   colchicine 0.6 MG tablet Take 1 tablet (0.6 mg total) by mouth 2 (two) times daily. 60 tablet 1   empagliflozin (JARDIANCE) 10 MG TABS tablet Take 10 mg by mouth daily.     famotidine (PEPCID) 20 MG tablet Take 1 tablet (20 mg total) by mouth 2 (two) times daily. 60 tablet 3   furosemide (LASIX) 40 MG tablet Take 1 tablet (40 mg total) by mouth daily. 90 tablet 3   gabapentin (NEURONTIN) 100 MG capsule Take 1 capsule (100 mg total) by mouth 2 (two) times daily. 60 capsule 2   losartan (COZAAR) 50 MG tablet Take 1 tablet (50 mg total) by mouth daily. 90 tablet  3   metoprolol succinate (TOPROL-XL) 25 MG 24 hr tablet Take 2 tablets (50 mg total) by mouth daily. Stop taking metoprolol if you continue to use cocaine 180 tablet 3   nicotine (NICODERM CQ - DOSED IN MG/24 HOURS) 21 mg/24hr patch Place 1 patch (21 mg total) onto the skin daily. 28 patch 0   nitroGLYCERIN (NITROSTAT) 0.4 MG SL tablet Place 1 tablet (0.4 mg total) under the tongue every 5 (five) minutes as needed for chest pain (if chest pain not resolved after 3 doses, please call 911). 25 tablet 2   pantoprazole (PROTONIX) 40 MG tablet Take 1 tablet (40 mg  total) by mouth 2 (two) times daily. 60 tablet 1   rosuvastatin (CRESTOR) 40 MG tablet Take 1 tablet (40 mg total) by mouth daily. 90 tablet 3   Sod Picosulfate-Mag Ox-Cit Acd (CLENPIQ) 10-3.5-12 MG-GM -GM/160ML SOLN Take 1 kit by mouth as directed. 320 mL 0   spironolactone (ALDACTONE) 25 MG tablet Take 1 tablet (25 mg total) by mouth daily. 90 tablet 3   VENTOLIN HFA 108 (90 Base) MCG/ACT inhaler SMARTSIG:1 Via Inhaler Daily     No current facility-administered medications for this visit.    Allergies  Allergen Reactions   Bee Pollen       Social History   Socioeconomic History   Marital status: Single    Spouse name: Not on file   Number of children: 3   Years of education: Not on file   Highest education level: Not on file  Occupational History   Not on file  Tobacco Use   Smoking status: Some Days    Packs/day: 0.25    Years: 10.00    Additional pack years: 0.00    Total pack years: 2.50    Types: Cigarettes    Passive exposure: Past   Smokeless tobacco: Never   Tobacco comments:    Smokes 1-2 cigarettes every 2-3 days-05/06/2022  Vaping Use   Vaping Use: Never used  Substance and Sexual Activity   Alcohol use: Yes    Alcohol/week: 1.0 standard drink of alcohol    Types: 1 Shots of liquor per week    Comment: socially   Drug use: Yes    Types: Codeine   Sexual activity: Yes  Other Topics Concern   Not on file  Social History Narrative   Lives with his uncle    Social Determinants of Health   Financial Resource Strain: Not on file  Food Insecurity: No Food Insecurity (05/01/2022)   Hunger Vital Sign    Worried About Running Out of Food in the Last Year: Never true    Ran Out of Food in the Last Year: Never true  Transportation Needs: No Transportation Needs (05/01/2022)   PRAPARE - Administrator, Civil Service (Medical): No    Lack of Transportation (Non-Medical): No  Physical Activity: Not on file  Stress: Not on file  Social Connections:  Not on file  Intimate Partner Violence: Not At Risk (05/01/2022)   Humiliation, Afraid, Rape, and Kick questionnaire    Fear of Current or Ex-Partner: No    Emotionally Abused: No    Physically Abused: No    Sexually Abused: No      Family History  Problem Relation Age of Onset   Coronary artery disease Mother    Arrhythmia Mother        Pacemaker placed in 2021   Heart failure Mother    Breast cancer Mother  Coronary artery disease Father    Colon cancer Father    Colon polyps Father     Vitals:   07/31/22 1014  BP: 118/86  Pulse: 77  SpO2: 97%  Weight: 239 lb (108.4 kg)  Height: 6' (1.829 m)     Wt Readings from Last 3 Encounters:  07/31/22 239 lb (108.4 kg)  07/03/22 237 lb (107.5 kg)  06/20/22 234 lb (106.1 kg)     PHYSICAL EXAM: General:  Sitting in chair. Prominent breathing pattern HEENT: normal Neck: supple. no JVD. Carotids 2+ bilat; no bruits. No lymphadenopathy or thryomegaly appreciated. Cor: PMI nondisplaced. Regular rate & rhythm. No rubs, gallops or murmurs. Lungs: clear Abdomen: soft, nontender, nondistended. No hepatosplenomegaly. No bruits or masses. Good bowel sounds. Extremities: no cyanosis, clubbing, rash, edema Neuro: alert & orientedx3, cranial nerves grossly intact. moves all 4 extremities w/o difficulty. Affect pleasant l nerves grossly intact. moves all 4 extremities w/o difficulty. Affect pleasant   ASSESSMENT & PLAN:  1. Chronic diastolic HF - S/p CABG 12/27.  - Echo 12/29 with EF 50-55%, mild AI, mild MR.  - Bedside echo 03/07/22 in clinic LVEF 60% RV ok. No pericardial effusion - Echo 05/01/22 EF 55% - Stable NYHA II-III - Despite weight gain and SOB volume status actually looks quite good on exam and ReDS 26%. Doubt fluid gain. Has GI f/u for bloating.  - Continue Toprol 50 mg daily - Continue losartan 25 mg daily  - Continue spironolactone 25 mg daily.  - Continue Jardiance10 mg daily.   - Will not titrate GDMT as BP has  been soft - Suspect component of dyspnea is anxiety   2. CAD with NSTEMI - Hx prior PCI/stent to LAD in 2021 and PCI/stent to LAD in 03/23 d/t in-stent restenosis - NSTEMI 12/23 S/p CABG X 3 on 02/19/22 (LIMA to Diag, SVG to LAD, SVG to RCA) - No s/s angina Continue DAPT - Continue CR   3. CKD 3a -Baseline Scr 1.3-1.6 - Labs today   4. Polysubstance abuse -Tobacco and cocaine -UDS + for cocaine on admit - Reports he is off cocaine - Still smoking a few cigs/day - Discussed need to quit    5. Peripheral neuropathy - Continue gabapentin  6. OSA, severe - Split study  showed severe  OSA with AHI 52/ hr  - Seeing Dr. Vassie Loll. Recommended Bipap  7. COPD - per Pulmonary.  - PFTs with mild obstruction   Arvilla Meres, MD 07/31/22

## 2022-07-31 NOTE — Progress Notes (Signed)
Daily Session Note  Patient Details  Name: RANJIT GANEY MRN: [id number] Date of Birth: December 29, 1975 Referring Provider:   Flowsheet Row Cardiac Rehab from 05/20/2022 in [facility] Cardiac [facility]  Referring Provider Dr. Debbe Odea, MD       Encounter Date: 07/31/2022  Check In:  Session Check In - 07/31/22 1145       Check-In   Supervising physician immediately available to respond to emergencies See telemetry face sheet for immediately available ER MD    Location [facility]-Cardiac & Pulmonary Rehab    Staff Present Cora Collum, RN, BSN, CCRP;Joseph [place], RCP,RRT,BSRT;Other   [place] [facility]   Virtual Visit No    Medication changes reported     No    Fall or balance concerns reported    No    Warm-up and Cool-down Performed on first and last piece of equipment    Resistance Training Performed Yes    VAD Patient? No    PAD/SET Patient? No      Pain Assessment   Currently in Pain? No/denies                Social History   Tobacco Use  Smoking Status Some Days   Packs/day: 0.25   Years: 10.00   Additional pack years: 0.00   Total pack years: 2.50   Types: Cigarettes   Passive exposure: Past  Smokeless Tobacco Never  Tobacco Comments   Smokes 1-2 cigarettes every 2-3 days-05/06/2022    Goals Met:  Independence with exercise equipment Exercise tolerated well No report of concerns or symptoms today  Goals Unmet:  Not Applicable  Comments: Pt able to follow exercise prescription today without complaint.  Will continue to monitor for progression.    Dr. Bethann Punches is Medical Director for [facility] Cardiac Rehabilitation.  Dr. Vida Rigger is Medical Director for [facility] Pulmonary Rehabilitation.

## 2022-08-01 ENCOUNTER — Ambulatory Visit: Payer: Medicaid Other | Attending: Cardiology | Admitting: Cardiology

## 2022-08-01 ENCOUNTER — Telehealth: Payer: Self-pay | Admitting: Student in an Organized Health Care Education/Training Program

## 2022-08-01 ENCOUNTER — Encounter: Payer: Self-pay | Admitting: Cardiology

## 2022-08-01 ENCOUNTER — Other Ambulatory Visit: Payer: Self-pay | Admitting: Nurse Practitioner

## 2022-08-01 VITALS — BP 136/76 | HR 80 | Ht 72.0 in | Wt 237.2 lb

## 2022-08-01 DIAGNOSIS — Z951 Presence of aortocoronary bypass graft: Secondary | ICD-10-CM

## 2022-08-01 DIAGNOSIS — I10 Essential (primary) hypertension: Secondary | ICD-10-CM

## 2022-08-01 DIAGNOSIS — I5032 Chronic diastolic (congestive) heart failure: Secondary | ICD-10-CM

## 2022-08-01 DIAGNOSIS — F17200 Nicotine dependence, unspecified, uncomplicated: Secondary | ICD-10-CM

## 2022-08-01 NOTE — Telephone Encounter (Signed)
Sent Urgent Message to [employer] asking what else is needed

## 2022-08-01 NOTE — Telephone Encounter (Signed)
Patient states [employer] needs more information for BIPAP machine. Patient phone number is [phone].

## 2022-08-01 NOTE — Progress Notes (Signed)
Cardiology Office Note:    Date:  08/01/2022   ID:  [name], DOB 1975/09/11, MRN [id number]  PCP:  Donell Beers, FNP  [facility] Cardiologist:  Debbe Odea, MD  [facility] Electrophysiologist:  None   Referring MD: Donell Beers, FNP   Chief Complaint  Patient presents with   Follow-up    Patient denies new or acute cardiac problems/concerns today.  Does have abdominal bloating that was discussed with Dr. Gala Romney yesterday and was advised to stop Colchicine.     History of Present Illness:    RYUSEI MAYS is a 47 y.o. male with a hx of history of CAD s/p CABG x 3 in 01/2022  (LIMA - Diag, SVG - LAD, SVG - RCA), prior DES to mid LAD on 12/2019, HFpEF, hypertension, hyperlipidemia, prior cocaine use, current smoker x25+ years presenting for follow-up.  Symptoms of chest pain have improved since CABG about 6 months ago.  Denies edema, followed up with heart failure, last visit yesterday, colchicine was DC'd due to complaints of abdominal distention.  Patient did not seem volume overloaded.  Compliant with medications as prescribed.  States still smoking, denies any recent cocaine use, last use over 6 months ago.  Undergoing cardiac rehab twice weekly.  Gets out of breath with overexertion.  Endorses daytime somnolence, fatigue, diagnosed with sleep apnea, positive pressure mask was ordered.  Prior notes Echo 04/2022 EF 55%. Echo 11/30, EF 50% hypokinesis of the mid to apical anteroseptal wall. Left heart cath 12/2019 severe one-vessel CAD 90% mid lad stenosis, s/p DES to mid LAD.  Proximal RCA 60%, mid RCA 40%.  Past Medical History:  Diagnosis Date   CAD (coronary artery disease)    a. 11/2019 NSTEMI/PCI: LM min irregs, LAD 85m (2.75x22 Resolute Onyx DES), 40d, D2 min irregs, LCX/LPAV min irregs, RCA 60ost, 40p/m, EF 45-50%; b. 11/2020 MV: EF 43%, small area of apical ischemia ->? over processing-->low risk.   Hyperlipidemia    Hypertension     Ischemic cardiomyopathy    a. 11/2019 LV Gram: EF 45-50%; b. 12/2019 Echo: EF 50% w/ mod mid-apical anteroseptal HK, GrI DD, nl RV fxn, mild Ao sclerosis.   Tobacco use 11/25/2019    Past Surgical History:  Procedure Laterality Date   CORONARY ARTERY BYPASS GRAFT N/A 02/19/2022   Procedure: CORONARY ARTERY BYPASS GRAFTING (CABG) X THREE, USING ENDOSCOPICALLY HARVESTED RIGHT GREATER SAPHENOUS VEIN  AND LEFT ATRIAL APPENDAGE CLIPPING;  Surgeon: Lyn Hollingshead, MD;  Location: [facility] OR;  Service: Open Heart Surgery;  Laterality: N/A;   CORONARY STENT INTERVENTION N/A 01/24/2020   Procedure: CORONARY STENT INTERVENTION;  Surgeon: Iran Ouch, MD;  Location: [facility] INVASIVE CV LAB;  Service: Cardiovascular;  Laterality: N/A;   CORONARY STENT INTERVENTION N/A 05/20/2021   Procedure: CORONARY STENT INTERVENTION;  Surgeon: Iran Ouch, MD;  Location: [facility] INVASIVE CV LAB;  Service: Cardiovascular;  Laterality: N/A;   CORONARY ULTRASOUND/IVUS N/A 05/20/2021   Procedure: Intravascular Ultrasound/IVUS;  Surgeon: Iran Ouch, MD;  Location: [facility] INVASIVE CV LAB;  Service: Cardiovascular;  Laterality: N/A;   IRRIGATION AND DEBRIDEMENT KNEE Left    LEFT HEART CATH AND CORONARY ANGIOGRAPHY N/A 01/24/2020   Procedure: LEFT HEART CATH AND CORONARY ANGIOGRAPHY;  Surgeon: Iran Ouch, MD;  Location: [facility] INVASIVE CV LAB;  Service: Cardiovascular;  Laterality: N/A;   LEFT HEART CATH AND CORONARY ANGIOGRAPHY N/A 11/18/2021   Procedure: LEFT HEART CATH AND CORONARY ANGIOGRAPHY;  Surgeon: Iran Ouch, MD;  Location: [facility] INVASIVE CV LAB;  Service: Cardiovascular;  Laterality: N/A;   LEFT HEART CATH AND CORONARY ANGIOGRAPHY N/A 02/13/2022   Procedure: LEFT HEART CATH AND CORONARY ANGIOGRAPHY;  Surgeon: Marykay Lex, MD;  Location: [facility] INVASIVE CV LAB;  Service: Cardiovascular;  Laterality: N/A;   LEFT HEART CATH AND CORS/GRAFTS ANGIOGRAPHY N/A 05/20/2021   Procedure: LEFT HEART CATH AND  CORS/GRAFTS ANGIOGRAPHY;  Surgeon: Iran Ouch, MD;  Location: [facility] INVASIVE CV LAB;  Service: Cardiovascular;  Laterality: N/A;   TYMPANOSTOMY TUBE PLACEMENT Bilateral     Current Medications: Current Meds  Medication Sig   aspirin EC 81 MG tablet Take 1 tablet (81 mg total) by mouth daily. Swallow whole.   budesonide-formoterol (SYMBICORT) 80-4.5 MCG/ACT inhaler Inhale 2 puffs into the lungs every 4 (four) hours as needed.   clopidogrel (PLAVIX) 75 MG tablet Take 1 tablet (75 mg total) by mouth daily.   empagliflozin (JARDIANCE) 10 MG TABS tablet Take 10 mg by mouth daily.   famotidine (PEPCID) 20 MG tablet Take 1 tablet (20 mg total) by mouth 2 (two) times daily.   furosemide (LASIX) 40 MG tablet Take 1 tablet (40 mg total) by mouth daily.   gabapentin (NEURONTIN) 100 MG capsule Take 1 capsule (100 mg total) by mouth 2 (two) times daily.   losartan (COZAAR) 50 MG tablet Take 1 tablet (50 mg total) by mouth daily.   metoprolol succinate (TOPROL-XL) 25 MG 24 hr tablet Take 2 tablets (50 mg total) by mouth daily. Stop taking metoprolol if you continue to use cocaine   nicotine (NICODERM CQ - DOSED IN MG/24 HOURS) 21 mg/24hr patch Place 1 patch (21 mg total) onto the skin daily.   nitroGLYCERIN (NITROSTAT) 0.4 MG SL tablet Place 1 tablet (0.4 mg total) under the tongue every 5 (five) minutes as needed for chest pain (if chest pain not resolved after 3 doses, please call 911).   pantoprazole (PROTONIX) 40 MG tablet Take 1 tablet (40 mg total) by mouth 2 (two) times daily.   rosuvastatin (CRESTOR) 40 MG tablet Take 1 tablet (40 mg total) by mouth daily.   Sod Picosulfate-Mag Ox-Cit Acd (CLENPIQ) 10-3.5-12 MG-GM -GM/160ML SOLN Take 1 kit by mouth as directed.   spironolactone (ALDACTONE) 25 MG tablet Take 1 tablet (25 mg total) by mouth daily.   VENTOLIN HFA 108 (90 Base) MCG/ACT inhaler SMARTSIG:1 Via Inhaler Daily     Allergies:   Bee pollen   Social History   Socioeconomic History    Marital status: Single    Spouse name: Not on file   Number of children: 3   Years of education: Not on file   Highest education level: Not on file  Occupational History   Not on file  Tobacco Use   Smoking status: Some Days    Packs/day: 0.25    Years: 10.00    Additional pack years: 0.00    Total pack years: 2.50    Types: Cigarettes    Passive exposure: Past   Smokeless tobacco: Never   Tobacco comments:    Smokes 1-2 cigarettes every 2-3 days-05/06/2022  Vaping Use   Vaping Use: Never used  Substance and Sexual Activity   Alcohol use: Yes    Alcohol/week: 1.0 standard drink of alcohol    Types: 1 Shots of liquor per week    Comment: socially   Drug use: Yes    Types: Codeine   Sexual activity: Yes  Other Topics Concern   Not on file  Social History Narrative   Lives with his uncle    Social Determinants of Health   Financial Resource Strain: Not on file  Food Insecurity: No Food Insecurity (05/01/2022)   Hunger Vital Sign    Worried About Running Out of Food in the Last Year: Never true    Ran Out of Food in the Last Year: Never true  Transportation Needs: No Transportation Needs (05/01/2022)   PRAPARE - Administrator, Civil Service (Medical): No    Lack of Transportation (Non-Medical): No  Physical Activity: Not on file  Stress: Not on file  Social Connections: Not on file     Family History: The patient's family history includes Arrhythmia in his mother; Breast cancer in his mother; Colon cancer in his father; Colon polyps in his father; Coronary artery disease in his father and mother; Heart failure in his mother.  ROS:   Please see the history of present illness.     All other systems reviewed and are negative.  EKGs/Labs/Other Studies Reviewed:    The following studies were reviewed today:   EKG:  EKG not  ordered today.    Recent Labs: 02/24/2022: Magnesium 2.0 06/05/2022: ALT 16 06/20/2022: B Natriuretic Peptide 32.8; BUN 20;  Creatinine, Ser 1.48; Hemoglobin 14.5; Platelets 363; Potassium 4.1; Sodium 135  Recent Lipid Panel    Component Value Date/Time   CHOL 138 05/02/2022 0625   TRIG 331 (H) 05/02/2022 0625   HDL 26 (L) 05/02/2022 0625   CHOLHDL 5.3 05/02/2022 0625   VLDL 66 (H) 05/02/2022 0625   LDLCALC 46 05/02/2022 0625     Risk Assessment/Calculations:      Physical Exam:    VS:  BP 136/76 (BP Location: Left Arm, Patient Position: Sitting, Cuff Size: Normal)   Pulse 80   Ht 6' (1.829 m)   Wt 237 lb 3.2 oz (107.6 kg)   SpO2 96%   BMI 32.17 kg/m     Wt Readings from Last 3 Encounters:  08/01/22 237 lb 3.2 oz (107.6 kg)  07/31/22 239 lb (108.4 kg)  07/03/22 237 lb (107.5 kg)     GEN:  Well nourished, well developed in no acute distress HEENT: Normal NECK: No JVD; No carotid bruits LYMPHATICS: No lymphadenopathy CARDIAC: RRR, no murmurs, rubs, gallops RESPIRATORY:  Clear to auscultation without rales, wheezing or rhonchi  ABDOMEN: Soft, non-tender, non-distended MUSCULOSKELETAL:  No edema; No deformity  SKIN: Warm and dry NEUROLOGIC:  Alert and oriented x 3 PSYCHIATRIC:  Normal affect   ASSESSMENT:    1. S/P CABG x 3   2. Chronic diastolic CHF (congestive heart failure) (HCC)   3. Primary hypertension   4. Smoking    PLAN:    In order of problems listed above:  CAD s/p CABG x 3 01/2022, (prior DES to mid LAD on 12/2019).  Denies chest pain, continue aspirin, Plavix, Crestor 40, Toprol-XL 50 mg daily.  Continue cardiac rehab. HFpEF, appears euvolemic.  Shortness of breath possibly from untreated sleep apnea, current smoker.  Appreciate input from heart failure service.  Continue Lasix 40 mg daily, Jardiance.  Aldactone 25 mg daily. Hypertension, BP controlled .  Continue Toprol-XL, losartan, Aldactone. Current smoker, smoking cessation advised.   Follow-up in 6 months.  Medication Adjustments/Labs and Tests Ordered: Current medicines are reviewed at length with the patient  today.  Concerns regarding medicines are outlined above.  No orders of the defined types were placed in this encounter.  No orders of the defined  types were placed in this encounter.   Patient Instructions  Medication Instructions:   Your physician recommends that you continue on your current medications as directed. Please refer to the Current Medication list given to you today.  *If you need a refill on your cardiac medications before your next appointment, please call your pharmacy*   Lab Work:  None Ordered If you have labs (blood work) drawn today and your tests are completely normal, you will receive your results only by: MyChart Message (if you have MyChart) OR A paper copy in the mail If you have any lab test that is abnormal or we need to change your treatment, we will call you to review the results.   Testing/Procedures:  None Ordered   Follow-Up: At [facility], you and your health needs are our priority.  As part of our continuing mission to provide you with exceptional heart care, we have created designated Provider Care Teams.  These Care Teams include your primary Cardiologist (physician) and Advanced Practice Providers (APPs -  Physician Assistants and Nurse Practitioners) who all work together to provide you with the care you need, when you need it.  We recommend signing up for the patient portal called "MyChart".  Sign up information is provided on this After Visit Summary.  MyChart is used to connect with patients for Virtual Visits (Telemedicine).  Patients are able to view lab/test results, encounter notes, upcoming appointments, etc.  Non-urgent messages can be sent to your provider as well.   To learn more about what you can do with MyChart, go to [email or web address].    Your next appointment:   6 month(s)  Provider:   You may see Debbe Odea, MD or one of the following Advanced Practice Providers on your designated Care Team:    Nicolasa Ducking, NP Eula Listen, PA-C Cadence Fransico Michael, PA-C Charlsie Quest, NP   Signed, Debbe Odea, MD  08/01/2022 11:27 AM    [facility]

## 2022-08-01 NOTE — Telephone Encounter (Signed)
Order was placed on 07/22/2022 and sent to [employer]. Can you check on this?

## 2022-08-01 NOTE — Patient Instructions (Signed)
Medication Instructions:   Your physician recommends that you continue on your current medications as directed. Please refer to the Current Medication list given to you today.  *If you need a refill on your cardiac medications before your next appointment, please call your pharmacy*   Lab Work:  None Ordered  If you have labs (blood work) drawn today and your tests are completely normal, you will receive your results only by: MyChart Message (if you have MyChart) OR A paper copy in the mail If you have any lab test that is abnormal or we need to change your treatment, we will call you to review the results.   Testing/Procedures:  None Ordered   Follow-Up: At [facility], you and your health needs are our priority.  As part of our continuing mission to provide you with exceptional heart care, we have created designated Provider Care Teams.  These Care Teams include your primary Cardiologist (physician) and Advanced Practice Providers (APPs -  Physician Assistants and Nurse Practitioners) who all work together to provide you with the care you need, when you need it.  We recommend signing up for the patient portal called "MyChart".  Sign up information is provided on this After Visit Summary.  MyChart is used to connect with patients for Virtual Visits (Telemedicine).  Patients are able to view lab/test results, encounter notes, upcoming appointments, etc.  Non-urgent messages can be sent to your provider as well.   To learn more about what you can do with MyChart, go to [email or web address].    Your next appointment:   6 month(s)  Provider:   You may see Brian Agbor-Etang, MD or one of the following Advanced Practice Providers on your designated Care Team:   Christopher Berge, NP Ryan Dunn, PA-C Cadence Furth, PA-C Sheri Hammock, NP 

## 2022-08-04 NOTE — Telephone Encounter (Signed)
Rec'd message back from Logan Crawford with [employer] "We needed a copy of the sleep study from 5/24 because the F1f note mentioned it. It was just saved on 07-28-22. I pulled it and sent it to intake.  we should have all this is needed now"

## 2022-08-05 ENCOUNTER — Ambulatory Visit: Payer: Medicaid Other | Admitting: Internal Medicine

## 2022-08-05 ENCOUNTER — Telehealth: Payer: Self-pay | Admitting: *Deleted

## 2022-08-05 NOTE — Telephone Encounter (Signed)
Dr. Aundria Rud-  Patient is still scheduled for 08/20/22 endoscopy and colonoscopy. It appears his last office visit with you was 06/17/22, just over the "60 day" mark required by you all in order to give a pre-operative risk assessment.  He is scheduled to see you on 09/16/22, but, of course, our procedure precedes that date. Is there any way he could be seen a bit sooner by pulmonology so we can go forward with endoscopic evaluation if appropriate?  Thank you for your help and recommendations!  Dottie

## 2022-08-05 NOTE — Telephone Encounter (Signed)
===  View-only below this line=== ----- Message ----- From: Raechel Chute, MD Sent: 08/02/2022   4:36 PM EDT To: Oretha Milch, MD; Lucky Cowboy, RN  It appears that this procedure was cancelled. Kaira please reach back out to me if the procedure is to be rescheduled. If he's not been seen by me in the 60 days preceding the procedure, we'll need to bring him in for an in person evaluation.  Side note, we do not do "clearance" of patients. We'll provide pre-operative risk assessment and will optimize the patient prior to any procedure. That said, I'd caution that Mr. Caamano should be considered at least ASA class 3 given his multiple co-morbidities.  Thank you, Glenice Laine, MD [facility]  ----- Message ----- From: Oretha Milch, MD Sent: 08/02/2022   3:47 PM EDT To: Lucky Cowboy, RN; Raechel Chute, MD  Forwarding to Dr. Aundria Rud who is his primary pulmonologist ----- Message ----- From: Lucky Cowboy, RN Sent: 07/24/2022  11:16 AM EDT To: Oretha Milch, MD  Letter  Lucky Cowboy, RN on 07/24/2022     [facility] [address] [place], [place]  [address] Phone:  [phone]   Fax:  [phone]   Jul 24, 2022     Logan Crawford [address] [place] [address]     Dear Dr. Vassie Loll,      The patient listed above has been scheduled for an endo colon. Previous records indicate that patient has a history of severe OSA. Please advise on pulmonary clearance prior to his procedure scheduled for 08/20/22.    Thank you,     Glendora Score, RN

## 2022-08-06 ENCOUNTER — Encounter: Payer: Self-pay | Admitting: Student in an Organized Health Care Education/Training Program

## 2022-08-06 ENCOUNTER — Encounter: Payer: Self-pay | Admitting: *Deleted

## 2022-08-06 ENCOUNTER — Ambulatory Visit (INDEPENDENT_AMBULATORY_CARE_PROVIDER_SITE_OTHER): Payer: Medicaid Other | Admitting: Student in an Organized Health Care Education/Training Program

## 2022-08-06 VITALS — BP 100/60 | HR 80 | Temp 97.1°F | Ht 72.0 in | Wt 238.8 lb

## 2022-08-06 DIAGNOSIS — J438 Other emphysema: Secondary | ICD-10-CM | POA: Diagnosis not present

## 2022-08-06 DIAGNOSIS — G4733 Obstructive sleep apnea (adult) (pediatric): Secondary | ICD-10-CM

## 2022-08-06 DIAGNOSIS — Z951 Presence of aortocoronary bypass graft: Secondary | ICD-10-CM

## 2022-08-06 MED ORDER — TRELEGY ELLIPTA 100-62.5-25 MCG/ACT IN AEPB: 1.0000 | INHALATION_SPRAY | Freq: Every day | RESPIRATORY_TRACT | 0 refills | Status: DC

## 2022-08-06 MED ORDER — TRELEGY ELLIPTA 100-62.5-25 MCG/ACT IN AEPB: 1.0000 | INHALATION_SPRAY | Freq: Once | RESPIRATORY_TRACT | 6 refills | Status: AC

## 2022-08-06 NOTE — Progress Notes (Signed)
Assessment & Plan:   #COPD / emphysema  He presents for the evaluation of exertional dyspnea in the setting of significant cardiac history as well as history of smoking. His chest CT from December 2023 is notable for paraseptal and centrilobular emphysema consistent with his history of smoking and cocaine use. PFT's are consistent with obstruction/COPD, with a component of restriction as well. He'd been on Symbicort which I will optimize to triple therapy with Trelegy Ellipta (LABA/LABA/ICS). He's been followed closely by cardiology and will continue to follow up with them. With Trelegy, his obstructive lung disease will be optimized.   - Fluticasone-Umeclidin-Vilant (TRELEGY ELLIPTA) 100-62.5-25 MCG/ACT AEPB; Inhale 1 puff into the lungs once for 1 dose.  Dispense: 1 each; Refill: 6  #OSA  Reported symptoms consistent with sleep apnea, and given heart failure he's at increased risk. Underwent sleep study showing severe OSA with AHI of 52. Recommended BiPAP 16/12, which I've ordered without a back up rate given his heart failure. He has yet to get his BiPAP machine  #Pre-operative optimization  He is to undergo a low risk procedure (colonosocpy and EGD) and is currently optimized from a pulmonary perspective. Patient's risk for pulmonary complications is low, especially given low risk procedure, but his overall risk is low to moderate given concomitant heart failure. Would recommend utilization of BiPAP with above settings following anesthesia, and use of Duo-nebs for bronchodilation post procedure.  Return in about 1 year (around 08/06/2023).  I spent 30 minutes caring for this patient today, including preparing to see the patient, obtaining a medical history , reviewing a separately obtained history, performing a medically appropriate examination and/or evaluation, counseling and educating the patient/family/caregiver, referring and communicating with other health care professionals (not  separately reported), and documenting clinical information in the electronic health record.  Raechel Chute, MD [facility] 08/06/2022 5:54 PM    End of visit medications:  Meds ordered this encounter  Medications   Fluticasone-Umeclidin-Vilant (TRELEGY ELLIPTA) 100-62.5-25 MCG/ACT AEPB    Sig: Inhale 1 puff into the lungs once for 1 dose.    Dispense:  1 each    Refill:  6   Fluticasone-Umeclidin-Vilant (TRELEGY ELLIPTA) 100-62.5-25 MCG/ACT AEPB    Sig: Inhale 1 puff into the lungs daily.    Dispense:  28 each    Refill:  0     Current Outpatient Medications:    aspirin EC 81 MG tablet, Take 1 tablet (81 mg total) by mouth daily. Swallow whole., Disp: 90 tablet, Rfl: 3   clopidogrel (PLAVIX) 75 MG tablet, Take 1 tablet (75 mg total) by mouth daily., Disp: 90 tablet, Rfl: 3   empagliflozin (JARDIANCE) 10 MG TABS tablet, Take 10 mg by mouth daily., Disp: , Rfl:    famotidine (PEPCID) 20 MG tablet, Take 1 tablet (20 mg total) by mouth 2 (two) times daily., Disp: 60 tablet, Rfl: 3   Fluticasone-Umeclidin-Vilant (TRELEGY ELLIPTA) 100-62.5-25 MCG/ACT AEPB, Inhale 1 puff into the lungs once for 1 dose., Disp: 1 each, Rfl: 6   Fluticasone-Umeclidin-Vilant (TRELEGY ELLIPTA) 100-62.5-25 MCG/ACT AEPB, Inhale 1 puff into the lungs daily., Disp: 28 each, Rfl: 0   furosemide (LASIX) 40 MG tablet, Take 1 tablet (40 mg total) by mouth daily., Disp: 90 tablet, Rfl: 3   gabapentin (NEURONTIN) 100 MG capsule, Take 1 capsule (100 mg total) by mouth 2 (two) times daily., Disp: 60 capsule, Rfl: 2   losartan (COZAAR) 50 MG tablet, Take 1 tablet (50 mg total) by mouth  daily., Disp: 90 tablet, Rfl: 3   metoprolol succinate (TOPROL-XL) 25 MG 24 hr tablet, Take 2 tablets (50 mg total) by mouth daily. Stop taking metoprolol if you continue to use cocaine, Disp: 180 tablet, Rfl: 3   nicotine (NICODERM CQ - DOSED IN MG/24 HOURS) 21 mg/24hr patch, Place 1 patch (21 mg total) onto the skin  daily., Disp: 28 patch, Rfl: 0   nitroGLYCERIN (NITROSTAT) 0.4 MG SL tablet, Place 1 tablet (0.4 mg total) under the tongue every 5 (five) minutes as needed for chest pain (if chest pain not resolved after 3 doses, please call 911)., Disp: 25 tablet, Rfl: 2   pantoprazole (PROTONIX) 40 MG tablet, Take 1 tablet (40 mg total) by mouth 2 (two) times daily., Disp: 60 tablet, Rfl: 1   rosuvastatin (CRESTOR) 40 MG tablet, Take 1 tablet (40 mg total) by mouth daily., Disp: 90 tablet, Rfl: 3   Sod Picosulfate-Mag Ox-Cit Acd (CLENPIQ) 10-3.5-12 MG-GM -GM/160ML SOLN, Take 1 kit by mouth as directed., Disp: 320 mL, Rfl: 0   spironolactone (ALDACTONE) 25 MG tablet, Take 1 tablet (25 mg total) by mouth daily., Disp: 90 tablet, Rfl: 3   VENTOLIN HFA 108 (90 Base) MCG/ACT inhaler, SMARTSIG:1 Via Inhaler Daily, Disp: , Rfl:    Subjective:   PATIENT ID: Logan Crawford GENDER: male DOB: 09-06-1975, MRN: [id number]  Chief Complaint  Patient presents with   Follow-up    DOE. No cough. Occasional wheezing.    HPI  The patient is a 47 year old male presenting to clinic for follow up.   Patient was first seen by me in March of 2024. He's since underwent PFT's on 5/1 showing mild obstruction and restriction. He's also underwent a sleep study and orders for BiPAP were placed for treatment of severe OSA.   Patient's symptoms have mostly consisted of episodic dyspnea and wheezing. He's used his Symbicort once a day and feels ok with it. He hasn't has any wheezing since then, and denies any cough or sputum production. He hasn't received his BiPAP machine yet, and hasn't yet started treatment for his OSA.  Patient has an extensive cardiac history and started experiencing exertional dyspnea over the past 2 years alongside his cardiac symptoms.  He has been followed closely by cardiology for his CAD as well as advanced heart failure.  Patient initially underwent PCI in November of 2021 which was around the time his  symptoms started.  He developed chest pain again in September of 2023 with cardiac cath showing two-vessel disease treated medically.  Readmission in December 2023 again with anginal symptoms at which point he underwent CABG (LIMA to diagonal, SVG to LAD, SVG to RCA).  Following this, he has been seen by our advanced heart failure specialist as well as his general cardiology team. He is maintained on goal-directed medical therapy as well as DAPT (aspirin and clopidogrel).   Patient has a history of tobacco use, reports quitting following his recent admission.  He previously snorted cocaine and smoked marijuana but does not do that at the moment.  Edible MJ use reported.  Ancillary information including prior medications, full medical/surgical/family/social histories, and PFTs (when available) are listed below and have been reviewed.   Review of Systems  Constitutional:  Negative for chills, fever and weight loss.  Respiratory:  Positive for shortness of breath. Negative for cough, hemoptysis and sputum production.   Skin:  Negative for rash.     Objective:   Vitals:   08/06/22 1619  BP: 100/60  Pulse: 80  Temp: (!) 97.1 F (36.2 C)  SpO2: 98%  Weight: 238 lb 12.8 oz (108.3 kg)  Height: 6' (1.829 m)   98% on RA BMI Readings from Last 3 Encounters:  08/06/22 32.39 kg/m  08/01/22 32.17 kg/m  07/31/22 32.41 kg/m   Wt Readings from Last 3 Encounters:  08/06/22 238 lb 12.8 oz (108.3 kg)  08/01/22 237 lb 3.2 oz (107.6 kg)  07/31/22 239 lb (108.4 kg)    Physical Exam Constitutional:      Appearance: Normal appearance. He is not ill-appearing.  HENT:     Mouth/Throat:     Mouth: Mucous membranes are moist.  Cardiovascular:     Rate and Rhythm: Normal rate and regular rhythm.     Pulses: Normal pulses.     Heart sounds: Normal heart sounds.  Pulmonary:     Effort: Pulmonary effort is normal.     Breath sounds: Normal breath sounds.  Abdominal:     Palpations: Abdomen is  soft.  Musculoskeletal:     Right lower leg: No edema.     Left lower leg: No edema.  Neurological:     General: No focal deficit present.     Mental Status: He is alert and oriented to person, place, and time. Mental status is at baseline.       Ancillary Information    Past Medical History:  Diagnosis Date   CAD (coronary artery disease)    a. 11/2019 NSTEMI/PCI: LM min irregs, LAD 40m (2.75x22 Resolute Onyx DES), 40d, D2 min irregs, LCX/LPAV min irregs, RCA 60ost, 40p/m, EF 45-50%; b. 11/2020 MV: EF 43%, small area of apical ischemia ->? over processing-->low risk.   Hyperlipidemia    Hypertension    Ischemic cardiomyopathy    a. 11/2019 LV Gram: EF 45-50%; b. 12/2019 Echo: EF 50% w/ mod mid-apical anteroseptal HK, GrI DD, nl RV fxn, mild Ao sclerosis.   Tobacco use 11/25/2019     Family History  Problem Relation Age of Onset   Coronary artery disease Mother    Arrhythmia Mother        Pacemaker placed in 2021   Heart failure Mother    Breast cancer Mother    Coronary artery disease Father    Colon cancer Father    Colon polyps Father      Past Surgical History:  Procedure Laterality Date   CORONARY ARTERY BYPASS GRAFT N/A 02/19/2022   Procedure: CORONARY ARTERY BYPASS GRAFTING (CABG) X THREE, USING ENDOSCOPICALLY HARVESTED RIGHT GREATER SAPHENOUS VEIN  AND LEFT ATRIAL APPENDAGE CLIPPING;  Surgeon: Lyn Hollingshead, MD;  Location: [facility] OR;  Service: Open Heart Surgery;  Laterality: N/A;   CORONARY STENT INTERVENTION N/A 01/24/2020   Procedure: CORONARY STENT INTERVENTION;  Surgeon: Iran Ouch, MD;  Location: [facility];  Service: Cardiovascular;  Laterality: N/A;   CORONARY STENT INTERVENTION N/A 05/20/2021   Procedure: CORONARY STENT INTERVENTION;  Surgeon: Iran Ouch, MD;  Location: [facility];  Service: Cardiovascular;  Laterality: N/A;   CORONARY ULTRASOUND/IVUS N/A 05/20/2021   Procedure: Intravascular Ultrasound/IVUS;  Surgeon:  Iran Ouch, MD;  Location: [facility];  Service: Cardiovascular;  Laterality: N/A;   IRRIGATION AND DEBRIDEMENT KNEE Left    LEFT HEART CATH AND CORONARY ANGIOGRAPHY N/A 01/24/2020   Procedure: LEFT HEART CATH AND CORONARY ANGIOGRAPHY;  Surgeon: Iran Ouch, MD;  Location: [facility];  Service: Cardiovascular;  Laterality: N/A;   LEFT HEART CATH  AND CORONARY ANGIOGRAPHY N/A 11/18/2021   Procedure: LEFT HEART CATH AND CORONARY ANGIOGRAPHY;  Surgeon: Iran Ouch, MD;  Location: [facility];  Service: Cardiovascular;  Laterality: N/A;   LEFT HEART CATH AND CORONARY ANGIOGRAPHY N/A 02/13/2022   Procedure: LEFT HEART CATH AND CORONARY ANGIOGRAPHY;  Surgeon: Marykay Lex, MD;  Location: [facility];  Service: Cardiovascular;  Laterality: N/A;   LEFT HEART CATH AND CORS/GRAFTS ANGIOGRAPHY N/A 05/20/2021   Procedure: LEFT HEART CATH AND CORS/GRAFTS ANGIOGRAPHY;  Surgeon: Iran Ouch, MD;  Location: [facility];  Service: Cardiovascular;  Laterality: N/A;   TYMPANOSTOMY TUBE PLACEMENT Bilateral     Social History   Socioeconomic History   Marital status: Single    Spouse name: Not on file   Number of children: 3   Years of education: Not on file   Highest education level: Not on file  Occupational History   Not on file  Tobacco Use   Smoking status: Every Day    Packs/day: 1.00    Years: 10.00    Additional pack years: 0.00    Total pack years: 10.00    Types: Cigarettes    Passive exposure: Past   Smokeless tobacco: Never   Tobacco comments:    2-3 cigarettes daily- 08/06/2022  Vaping Use   Vaping Use: Never used  Substance and Sexual Activity   Alcohol use: Yes    Alcohol/week: 1.0 standard drink of alcohol    Types: 1 Shots of liquor per week    Comment: socially   Drug use: Yes    Types: Codeine   Sexual activity: Yes  Other Topics Concern   Not on file  Social History Narrative   Lives with his uncle     Social Determinants of Health   Financial Resource Strain: Not on file  Food Insecurity: No Food Insecurity (05/01/2022)   Hunger Vital Sign    Worried About Running Out of Food in the Last Year: Never true    Ran Out of Food in the Last Year: Never true  Transportation Needs: No Transportation Needs (05/01/2022)   PRAPARE - Administrator, Civil Service (Medical): No    Lack of Transportation (Non-Medical): No  Physical Activity: Not on file  Stress: Not on file  Social Connections: Not on file  Intimate Partner Violence: Not At Risk (05/01/2022)   Humiliation, Afraid, Rape, and Kick questionnaire    Fear of Current or Ex-Partner: No    Emotionally Abused: No    Physically Abused: No    Sexually Abused: No     Allergies  Allergen Reactions   Bee Pollen      CBC    Component Value Date/Time   WBC 9.8 06/20/2022 1247   RBC 5.62 06/20/2022 1247   HGB 14.5 06/20/2022 1247   HGB 16.3 10/03/2013 1306   HCT 44.8 06/20/2022 1247   HCT 48.3 10/03/2013 1306   PLT 363 06/20/2022 1247   PLT 239 10/03/2013 1306   MCV 79.7 (L) 06/20/2022 1247   MCV 95 10/03/2013 1306   MCH 25.8 (L) 06/20/2022 1247   MCHC 32.4 06/20/2022 1247   RDW 17.0 (H) 06/20/2022 1247   RDW 12.5 10/03/2013 1306   LYMPHSABS 2.4 05/01/2022 0800   LYMPHSABS 2.0 10/03/2013 1306   MONOABS 1.0 05/01/2022 0800   MONOABS 0.9 10/03/2013 1306   EOSABS 1.1 (H) 05/01/2022 0800   EOSABS 0.5 10/03/2013 1306   BASOSABS 0.1 05/01/2022 0800  BASOSABS 0.1 10/03/2013 1306    Pulmonary Functions Testing Results:    Latest Ref Rng & Units 06/20/2022    1:17 PM  PFT Results  FVC-Pre L 3.37   FVC-Predicted Pre % 61   FVC-Post L 3.57   FVC-Predicted Post % 65   Pre FEV1/FVC % % 74   Post FEV1/FCV % % 76   FEV1-Pre L 2.50   FEV1-Predicted Pre % 58   FEV1-Post L 2.72   DLCO uncorrected ml/min/mmHg 22.30   DLCO UNC% % 70   DLVA Predicted % 107   TLC L 3.73   TLC % Predicted % 50   RV % Predicted % 21      Outpatient Medications Prior to Visit  Medication Sig Dispense Refill   aspirin EC 81 MG tablet Take 1 tablet (81 mg total) by mouth daily. Swallow whole. 90 tablet 3   clopidogrel (PLAVIX) 75 MG tablet Take 1 tablet (75 mg total) by mouth daily. 90 tablet 3   empagliflozin (JARDIANCE) 10 MG TABS tablet Take 10 mg by mouth daily.     famotidine (PEPCID) 20 MG tablet Take 1 tablet (20 mg total) by mouth 2 (two) times daily. 60 tablet 3   furosemide (LASIX) 40 MG tablet Take 1 tablet (40 mg total) by mouth daily. 90 tablet 3   gabapentin (NEURONTIN) 100 MG capsule Take 1 capsule (100 mg total) by mouth 2 (two) times daily. 60 capsule 2   losartan (COZAAR) 50 MG tablet Take 1 tablet (50 mg total) by mouth daily. 90 tablet 3   metoprolol succinate (TOPROL-XL) 25 MG 24 hr tablet Take 2 tablets (50 mg total) by mouth daily. Stop taking metoprolol if you continue to use cocaine 180 tablet 3   nicotine (NICODERM CQ - DOSED IN MG/24 HOURS) 21 mg/24hr patch Place 1 patch (21 mg total) onto the skin daily. 28 patch 0   nitroGLYCERIN (NITROSTAT) 0.4 MG SL tablet Place 1 tablet (0.4 mg total) under the tongue every 5 (five) minutes as needed for chest pain (if chest pain not resolved after 3 doses, please call 911). 25 tablet 2   pantoprazole (PROTONIX) 40 MG tablet Take 1 tablet (40 mg total) by mouth 2 (two) times daily. 60 tablet 1   rosuvastatin (CRESTOR) 40 MG tablet Take 1 tablet (40 mg total) by mouth daily. 90 tablet 3   Sod Picosulfate-Mag Ox-Cit Acd (CLENPIQ) 10-3.5-12 MG-GM -GM/160ML SOLN Take 1 kit by mouth as directed. 320 mL 0   spironolactone (ALDACTONE) 25 MG tablet Take 1 tablet (25 mg total) by mouth daily. 90 tablet 3   VENTOLIN HFA 108 (90 Base) MCG/ACT inhaler SMARTSIG:1 Via Inhaler Daily     budesonide-formoterol (SYMBICORT) 80-4.5 MCG/ACT inhaler Inhale 2 puffs into the lungs every 4 (four) hours as needed. 3 each 0   No facility-administered medications prior to visit.

## 2022-08-06 NOTE — Progress Notes (Signed)
Cardiac Individual Treatment Plan  Patient Details  Name: Logan Crawford MRN: [id number] Date of Birth: 08/15/1975 Referring Provider:   Flowsheet Row Cardiac Rehab from 05/20/2022 in [facility] Cardiac and Pulmonary Rehab  Referring Provider Dr. Debbe Odea, MD       Initial Encounter Date:  Flowsheet Row Cardiac Rehab from 05/20/2022 in [facility] Cardiac and Pulmonary Rehab  Date 05/20/22       Visit Diagnosis: S/P CABG x 3  Patient's Home Medications on Admission:  Current Outpatient Medications:    aspirin EC 81 MG tablet, Take 1 tablet (81 mg total) by mouth daily. Swallow whole., Disp: 90 tablet, Rfl: 3   budesonide-formoterol (SYMBICORT) 80-4.5 MCG/ACT inhaler, Inhale 2 puffs into the lungs every 4 (four) hours as needed., Disp: 3 each, Rfl: 0   clopidogrel (PLAVIX) 75 MG tablet, Take 1 tablet (75 mg total) by mouth daily., Disp: 90 tablet, Rfl: 3   empagliflozin (JARDIANCE) 10 MG TABS tablet, Take 10 mg by mouth daily., Disp: , Rfl:    famotidine (PEPCID) 20 MG tablet, Take 1 tablet (20 mg total) by mouth 2 (two) times daily., Disp: 60 tablet, Rfl: 3   furosemide (LASIX) 40 MG tablet, Take 1 tablet (40 mg total) by mouth daily., Disp: 90 tablet, Rfl: 3   gabapentin (NEURONTIN) 100 MG capsule, Take 1 capsule (100 mg total) by mouth 2 (two) times daily., Disp: 60 capsule, Rfl: 2   losartan (COZAAR) 50 MG tablet, Take 1 tablet (50 mg total) by mouth daily., Disp: 90 tablet, Rfl: 3   metoprolol succinate (TOPROL-XL) 25 MG 24 hr tablet, Take 2 tablets (50 mg total) by mouth daily. Stop taking metoprolol if you continue to use cocaine, Disp: 180 tablet, Rfl: 3   nicotine (NICODERM CQ - DOSED IN MG/24 HOURS) 21 mg/24hr patch, Place 1 patch (21 mg total) onto the skin daily., Disp: 28 patch, Rfl: 0   nitroGLYCERIN (NITROSTAT) 0.4 MG SL tablet, Place 1 tablet (0.4 mg total) under the tongue every 5 (five) minutes as needed for chest pain (if chest pain not resolved after 3 doses, please  call 911)., Disp: 25 tablet, Rfl: 2   pantoprazole (PROTONIX) 40 MG tablet, Take 1 tablet (40 mg total) by mouth 2 (two) times daily., Disp: 60 tablet, Rfl: 1   rosuvastatin (CRESTOR) 40 MG tablet, Take 1 tablet (40 mg total) by mouth daily., Disp: 90 tablet, Rfl: 3   Sod Picosulfate-Mag Ox-Cit Acd (CLENPIQ) 10-3.5-12 MG-GM -GM/160ML SOLN, Take 1 kit by mouth as directed., Disp: 320 mL, Rfl: 0   spironolactone (ALDACTONE) 25 MG tablet, Take 1 tablet (25 mg total) by mouth daily., Disp: 90 tablet, Rfl: 3   VENTOLIN HFA 108 (90 Base) MCG/ACT inhaler, SMARTSIG:1 Via Inhaler Daily, Disp: , Rfl:   Past Medical History: Past Medical History:  Diagnosis Date   CAD (coronary artery disease)    a. 11/2019 NSTEMI/PCI: LM min irregs, LAD 26m (2.75x22 Resolute Onyx DES), 40d, D2 min irregs, LCX/LPAV min irregs, RCA 60ost, 40p/m, EF 45-50%; b. 11/2020 MV: EF 43%, small area of apical ischemia ->? over processing-->low risk.   Hyperlipidemia    Hypertension    Ischemic cardiomyopathy    a. 11/2019 LV Gram: EF 45-50%; b. 12/2019 Echo: EF 50% w/ mod mid-apical anteroseptal HK, GrI DD, nl RV fxn, mild Ao sclerosis.   Tobacco use 11/25/2019    Tobacco Use: Social History   Tobacco Use  Smoking Status Some Days   Packs/day: 0.25   Years: 10.00  Additional pack years: 0.00   Total pack years: 2.50   Types: Cigarettes   Passive exposure: Past  Smokeless Tobacco Never  Tobacco Comments   Smokes 1-2 cigarettes every 2-3 days-05/06/2022    Labs: Review Flowsheet  More data exists      Latest Ref Rng & Units 02/21/2022 02/22/2022 02/23/2022 04/14/2022 05/02/2022  Labs for ITP Cardiac and Pulmonary Rehab  Cholestrol 0 - 200 mg/dL - - - - 295   LDL (calc) 0 - 99 mg/dL - - - - 46   HDL-C >62 mg/dL - - - - 26   Trlycerides <150 mg/dL - - - - 130   Hemoglobin A1c 4.8 - 5.6 % - - - 5.2  -  O2 Saturation % 62.6  44.6  52.6  44.8  - -     Exercise Target Goals: Exercise Program Goal: Individual  exercise prescription set using results from initial 6 min walk test and THRR while considering  patient's activity barriers and safety.   Exercise Prescription Goal: Initial exercise prescription builds to 30-45 minutes a day of aerobic activity, 2-3 days per week.  Home exercise guidelines will be given to patient during program as part of exercise prescription that the participant will acknowledge.   Education: Aerobic Exercise: - Group verbal and visual presentation on the components of exercise prescription. Introduces F.I.T.T principle from ACSM for exercise prescriptions.  Reviews F.I.T.T. principles of aerobic exercise including progression. Written material given at graduation. Flowsheet Row Cardiac Rehab from 05/20/2022 in [facility] Cardiac and Pulmonary Rehab  Education need identified 05/20/22       Education: Resistance Exercise: - Group verbal and visual presentation on the components of exercise prescription. Introduces F.I.T.T principle from ACSM for exercise prescriptions  Reviews F.I.T.T. principles of resistance exercise including progression. Written material given at graduation.    Education: Exercise & Equipment Safety: - Individual verbal instruction and demonstration of equipment use and safety with use of the equipment. Flowsheet Row Cardiac Rehab from 05/20/2022 in [facility] Cardiac and Pulmonary Rehab  Date 05/20/22  Educator NT  Instruction Review Code 1- Verbalizes Understanding       Education: Exercise Physiology & General Exercise Guidelines: - Group verbal and written instruction with models to review the exercise physiology of the cardiovascular system and associated critical values. Provides general exercise guidelines with specific guidelines to those with heart or lung disease.    Education: Flexibility, Balance, Mind/Body Relaxation: - Group verbal and visual presentation with interactive activity on the components of exercise prescription. Introduces  F.I.T.T principle from ACSM for exercise prescriptions. Reviews F.I.T.T. principles of flexibility and balance exercise training including progression. Also discusses the mind body connection.  Reviews various relaxation techniques to help reduce and manage stress (i.e. Deep breathing, progressive muscle relaxation, and visualization). Balance handout provided to take home. Written material given at graduation.   Activity Barriers & Risk Stratification:  Activity Barriers & Cardiac Risk Stratification - 05/20/22 1429       Activity Barriers & Cardiac Risk Stratification   Activity Barriers Back Problems;Shortness of Breath   Occasional pain in knees, feet, and hands   Cardiac Risk Stratification High             6 Minute Walk:  6 Minute Walk     Row Name 05/20/22 1425         6 Minute Walk   Phase Initial     Distance 1215 feet     Walk Time 6 minutes     #  of Rest Breaks 0     MPH 2.3     METS 3.93     RPE 10     Perceived Dyspnea  1     VO2 Peak 13.74     Symptoms No     Resting HR 95 bpm     Resting BP 124/66     Resting Oxygen Saturation  98 %     Exercise Oxygen Saturation  during 6 min walk 97 %     Max Ex. HR 111 bpm     Max Ex. BP 132/74     2 Minute Post BP 116/72              Oxygen Initial Assessment:   Oxygen Re-Evaluation:   Oxygen Discharge (Final Oxygen Re-Evaluation):   Initial Exercise Prescription:  Initial Exercise Prescription - 05/20/22 1400       Date of Initial Exercise RX and Referring Provider   Date 05/20/22    Referring Provider Dr. Debbe Odea, MD      Oxygen   Maintain Oxygen Saturation 88% or higher      Treadmill   MPH 2    Grade 1    Minutes 15    METs 2.81      NuStep   Level 3    SPM 80    Minutes 15    METs 3.93      Recumbant Elliptical   Level 1.5    RPM 50    Minutes 15    METs 3.93      Prescription Details   Frequency (times per week) 2    Duration Progress to 30 minutes of  continuous aerobic without signs/symptoms of physical distress      Intensity   THRR 40-80% of Max Heartrate 125-156    Ratings of Perceived Exertion 11-13    Perceived Dyspnea 0-4      Progression   Progression Continue to progress workloads to maintain intensity without signs/symptoms of physical distress.      Resistance Training   Training Prescription Yes    Weight 4 lb    Reps 10-15             Perform Capillary Blood Glucose checks as needed.  Exercise Prescription Changes:   Exercise Prescription Changes     Row Name 05/20/22 1400 06/05/22 1400 06/17/22 1500 07/01/22 0800 07/15/22 1500     Response to Exercise   Blood Pressure (Admit) 124/66 112/66 108/60 146/74 132/70   Blood Pressure (Exercise) 132/74 144/78 116/68 126/74 152/74   Blood Pressure (Exit) 116/72 98/56 106/62 120/70 124/70   Heart Rate (Admit) 95 bpm 93 bpm 92 bpm 93 bpm 88 bpm   Heart Rate (Exercise) 111 bpm 113 bpm 110 bpm 110 bpm 124 bpm   Heart Rate (Exit) 98 bpm 97 bpm 87 bpm 94 bpm 105 bpm   Oxygen Saturation (Admit) 98 % -- -- -- --   Oxygen Saturation (Exercise) 97 % -- -- -- --   Rating of Perceived Exertion (Exercise) 10 13 15 13 17    Perceived Dyspnea (Exercise) 1 -- -- -- --   Symptoms None none none none none   Comments Results First two days of exercise -- -- --   Duration -- Continue with 30 min of aerobic exercise without signs/symptoms of physical distress. Continue with 30 min of aerobic exercise without signs/symptoms of physical distress. Continue with 30 min of aerobic exercise without signs/symptoms of physical distress. Continue with  30 min of aerobic exercise without signs/symptoms of physical distress.   Intensity -- THRR unchanged THRR unchanged THRR unchanged THRR unchanged     Progression   Progression -- Continue to progress workloads to maintain intensity without signs/symptoms of physical distress. Continue to progress workloads to maintain intensity without  signs/symptoms of physical distress. Continue to progress workloads to maintain intensity without signs/symptoms of physical distress. Continue to progress workloads to maintain intensity without signs/symptoms of physical distress.   Average METs -- 2.79 2.91 3.23 3.53     Resistance Training   Training Prescription -- Yes Yes Yes Yes   Weight -- 4 lb 4 lb 4 lb 4 lb   Reps -- 10-15 10-15 10-15 10-15     Interval Training   Interval Training -- No No No Yes   Equipment -- -- -- -- REL-XR   Comments -- -- -- -- X mode -intervals up to level 13     Treadmill   MPH -- 2 2.1 2.2 3   Grade -- 1 2 2 2    Minutes -- 15 15 15 15    METs -- 2.81 3.19 3.29 4.12     NuStep   Level -- 3 7 -- 5   Minutes -- 15 15 -- 15   METs -- 2.7 3.1 -- 3.1     Recumbant Elliptical   Level -- -- 3 -- 3   Minutes -- -- 15 -- 15   METs -- -- 2.1 -- 2.4     Elliptical   Level -- -- -- -- 1  5 min   Minutes -- -- -- -- 15     REL-XR   Level -- -- -- 7 13  intervals   Minutes -- -- -- 15 15   METs -- -- -- 3.2 --     Oxygen   Maintain Oxygen Saturation -- 88% or higher 88% or higher 88% or higher 88% or higher            Exercise Comments:   Exercise Comments     Row Name 05/27/22 1115           Exercise Comments First full day of exercise!  Patient was oriented to gym and equipment including functions, settings, policies, and procedures.  Patient's individual exercise prescription and treatment plan were reviewed.  All starting workloads were established based on the results of the 6 minute walk test done at initial orientation visit.  The plan for exercise progression was also introduced and progression will be customized based on patient's performance and goals.                Exercise Goals and Review:   Exercise Goals     Row Name 05/20/22 1430             Exercise Goals   Increase Physical Activity Yes       Intervention Provide advice, education, support and  counseling about physical activity/exercise needs.;Develop an individualized exercise prescription for aerobic and resistive training based on initial evaluation findings, risk stratification, comorbidities and participant's personal goals.       Expected Outcomes Short Term: Attend rehab on a regular basis to increase amount of physical activity.;Long Term: Exercising regularly at least 3-5 days a week.;Long Term: Add in home exercise to make exercise part of routine and to increase amount of physical activity.       Increase Strength and Stamina Yes       Intervention Provide  advice, education, support and counseling about physical activity/exercise needs.;Develop an individualized exercise prescription for aerobic and resistive training based on initial evaluation findings, risk stratification, comorbidities and participant's personal goals.       Expected Outcomes Short Term: Increase workloads from initial exercise prescription for resistance, speed, and METs.;Short Term: Perform resistance training exercises routinely during rehab and add in resistance training at home;Long Term: Improve cardiorespiratory fitness, muscular endurance and strength as measured by increased METs and functional capacity ( )       Able to understand and use rate of perceived exertion (RPE) scale Yes       Intervention Provide education and explanation on how to use RPE scale       Expected Outcomes Short Term: Able to use RPE daily in rehab to express subjective intensity level;Long Term:  Able to use RPE to guide intensity level when exercising independently       Able to understand and use Dyspnea scale Yes       Intervention Provide education and explanation on how to use Dyspnea scale       Expected Outcomes Short Term: Able to use Dyspnea scale daily in rehab to express subjective sense of shortness of breath during exertion;Long Term: Able to use Dyspnea scale to guide intensity level when exercising independently        Knowledge and understanding of Target Heart Rate Range (THRR) Yes       Intervention Provide education and explanation of THRR including how the numbers were predicted and where they are located for reference       Expected Outcomes Short Term: Able to state/look up THRR;Long Term: Able to use THRR to govern intensity when exercising independently;Short Term: Able to use daily as guideline for intensity in rehab       Able to check pulse independently Yes       Intervention Provide education and demonstration on how to check pulse in carotid and radial arteries.;Review the importance of being able to check your own pulse for safety during independent exercise       Expected Outcomes Short Term: Able to explain why pulse checking is important during independent exercise;Long Term: Able to check pulse independently and accurately       Understanding of Exercise Prescription Yes       Intervention Provide education, explanation, and written materials on patient's individual exercise prescription       Expected Outcomes Short Term: Able to explain program exercise prescription;Long Term: Able to explain home exercise prescription to exercise independently                Exercise Goals Re-Evaluation :  Exercise Goals Re-Evaluation     Row Name 05/27/22 1115 06/05/22 1410 06/17/22 1542 06/26/22 1544 07/01/22 0848     Exercise Goal Re-Evaluation   Exercise Goals Review Increase Physical Activity;Able to understand and use rate of perceived exertion (RPE) scale;Knowledge and understanding of Target Heart Rate Range (THRR);Understanding of Exercise Prescription;Increase Strength and Stamina;Able to check pulse independently Increase Physical Activity;Increase Strength and Stamina;Understanding of Exercise Prescription Increase Physical Activity;Increase Strength and Stamina;Understanding of Exercise Prescription Increase Physical Activity;Increase Strength and Stamina;Understanding of Exercise  Prescription Increase Physical Activity;Increase Strength and Stamina;Understanding of Exercise Prescription   Comments Reviewed RPE scale, THR and program prescription with pt today.  Pt voiced understanding and was given a copy of goals to take home. Logan Crawford is off to a good start in rehab. He did well with the treadmill at a  speed of 2 mph with an incline of 1%. He also worked at level 3 on the T4 nustep during his first two sessions. He has done well with 4 lb hand weights for resistance training as well. We will continue to monitor his progress in the program. Logan Crawford continues to do well in rehab. He increased to level 7 on the T4 Nustep and up to level 3 on the REL. He increased his incline to 4% on the treadmill for 1 session. He is not quite hitting his THR  but RPEs are staying appropriate. We will continue to monitor. Logan Crawford is off to a good start in rehab.  He feels that he is struggling to get here twice a week, but admits that it does get him up and out of bed.  We offered a later class time, but he said that he was goo.  He is not doing much outside of rehab yet, but plans to rejoin [employer] to start working out more. We will review home exercise with him soon.  He is a fan of the sauna at [facility]! Logan Crawford has only attended rehab twice since the last review. He increased his overall average MET level to 3.23 METs. He increased his speed on the treadmill to 2.2 mph while maintaining an incline of 2% as well.  He also improved to level 7 on the XR. We will continue to monitor his progress in the program.   Expected Outcomes Short: Use RPE daily to regulate intensity.  Long: Follow program prescription in THR. Short: Continue to follow current exercise prescription and progressively increase workloads. Long: Continue to improve strength and stamina. Short: Slowly progress incline back up beyond baseline of 2% Long: Continue to increase overall MET level and stamina Short: Review home exercise guidelines Long: Add  in more exercise on off days Short: Continue to progressively increase treadmill workload. Long: Continue to improve strength and stamina.    Row Name 07/15/22 1515 07/29/22 1154 07/31/22 1501         Exercise Goal Re-Evaluation   Exercise Goals Review Increase Physical Activity;Increase Strength and Stamina;Understanding of Exercise Prescription Increase Physical Activity;Increase Strength and Stamina;Understanding of Exercise Prescription Increase Physical Activity;Increase Strength and Stamina;Understanding of Exercise Prescription     Comments Logan Crawford continues to do well in rehab. He tried out the elliptical for the first time and was able to complete 5 minutes. He started doing intervals up to level 13 on the XR.  He also increased to a 3.0 mph speed on the treadmill! We will continue to monitor. Logan Crawford has been out sick for the last two weeks.  His breathing has also gotten worse and he is scheduled to meet with a pulmonologist soon for a work up.  He has not been exercising with feeling sick, but he has been stretching more! Logan Crawford has not attended rehab since the last review due to being out sick. We will continue to monitor his progress once he returns to regular attendance in the program.     Expected Outcomes Short: Continue to increase duration on elliptical slowly Long: Continue to increase overall MET level and stamina Short: Add exercise back into routine Long: Continue to improve stamina Short: Return to regular attendance in rehab. Long: Continue to improve strength and stamina.              Discharge Exercise Prescription (Final Exercise Prescription Changes):  Exercise Prescription Changes - 07/15/22 1500       Response to Exercise  Blood Pressure (Admit) 132/70    Blood Pressure (Exercise) 152/74    Blood Pressure (Exit) 124/70    Heart Rate (Admit) 88 bpm    Heart Rate (Exercise) 124 bpm    Heart Rate (Exit) 105 bpm    Rating of Perceived Exertion (Exercise) 17     Symptoms none    Duration Continue with 30 min of aerobic exercise without signs/symptoms of physical distress.    Intensity THRR unchanged      Progression   Progression Continue to progress workloads to maintain intensity without signs/symptoms of physical distress.    Average METs 3.53      Resistance Training   Training Prescription Yes    Weight 4 lb    Reps 10-15      Interval Training   Interval Training Yes    Equipment REL-XR    Comments X mode -intervals up to level 13      Treadmill   MPH 3    Grade 2    Minutes 15    METs 4.12      NuStep   Level 5    Minutes 15    METs 3.1      Recumbant Elliptical   Level 3    Minutes 15    METs 2.4      Elliptical   Level 1   5 min   Minutes 15      REL-XR   Level 13   intervals   Minutes 15      Oxygen   Maintain Oxygen Saturation 88% or higher             Nutrition:  Target Goals: Understanding of nutrition guidelines, daily intake of sodium 1500mg , cholesterol 200mg , calories 30% from fat and 7% or less from saturated fats, daily to have 5 or more servings of fruits and vegetables.  Education: All About Nutrition: -Group instruction provided by verbal, written material, interactive activities, discussions, models, and posters to present general guidelines for heart healthy nutrition including fat, fiber, MyPlate, the role of sodium in heart healthy nutrition, utilization of the nutrition label, and utilization of this knowledge for meal planning. Follow up email sent as well. Written material given at graduation. Flowsheet Row Cardiac Rehab from 05/20/2022 in [facility] Cardiac and Pulmonary Rehab  Education need identified 05/20/22       Biometrics:  Pre Biometrics - 05/20/22 1432       Pre Biometrics   Height 6' (1.829 m)    Weight 226 lb 12.8 oz (102.9 kg)    Waist Circumference 40.5 inches    Hip Circumference 43 inches    Waist to Hip Ratio 0.94 %    BMI (Calculated) 30.75    Single Leg Stand  30 seconds              Nutrition Therapy Plan and Nutrition Goals:  Nutrition Therapy & Goals - 05/20/22 1131       Nutrition Therapy   Diet Heart healthy, low Na    Drug/Food Interactions Statins/Certain Fruits    Protein (specify units) 65g   CKD stg 3: 0.6g/kg/d   Fiber 30 grams    Whole Grain Foods 3 servings    Saturated Fats 16 max. grams    Fruits and Vegetables 8 servings/day    Sodium 2 grams      Personal Nutrition Goals   Nutrition Goal ST: review handouts, focus on lean meats, limit creamy or cheese sauces or add ons, and  add fruit and vegetable servings to meals when going out LT: limit Na <2g, limit saturated fat <16g/day, aim for at least 30g of fiber/day    Comments 47 y.o. M admitted to cardiac rehab s/p CABG x 3. PMHx includes HTN, NSTEMI (2021 and 2023), CAD, chronic diastolic CHF, GERD, Stg 3a CKD, HLD, polysubstance abuse, former tobacco use, hypercalcemia. Medications reviewed symbicort, jardiance, pepcid, furosemide, nicotine, pantoprazole, rosuvastatin. B: sausage egg and cheese on toast or raisin bran L: chik-fil-a or grilled chicken or ribeye steak sandwich - he has been trying to limit [employer] burger D: [place] food 2x/week, sushi, salmon S; chips or candy bars or pickles Drinks: water, gatorade (grape - regular) 2-3x/day and sweet tea 3-4x/day. He reports not cooking at home most of the time and would like to focus on modifications eating out as he does not want to cook - discussed some options to put together at home too like rotisserie chicken, microwave grains like brown rice, and frozen vegetables. Also discussed general heart healthy eating and considerations while eating out.      Intervention Plan   Intervention Prescribe, educate and counsel regarding individualized specific dietary modifications aiming towards targeted core components such as weight, hypertension, lipid management, diabetes, heart failure and other comorbidities.;Nutrition  handout(s) given to patient.    Expected Outcomes Short Term Goal: Understand basic principles of dietary content, such as calories, fat, sodium, cholesterol and nutrients.;Short Term Goal: A plan has been developed with personal nutrition goals set during dietitian appointment.;Long Term Goal: Adherence to prescribed nutrition plan.             Nutrition Assessments:  MEDIFICTS Score Key: ?70 Need to make dietary changes  40-70 Heart Healthy Diet ? 40 Therapeutic Level Cholesterol Diet  Flowsheet Row Cardiac Rehab from 05/20/2022 in [facility] Cardiac and Pulmonary Rehab  Picture Your Plate Total Score on Admission 50      Picture Your Plate Scores: <54 Unhealthy dietary pattern with much room for improvement. 41-50 Dietary pattern unlikely to meet recommendations for good health and room for improvement. 51-60 More healthful dietary pattern, with some room for improvement.  >60 Healthy dietary pattern, although there may be some specific behaviors that could be improved.    Nutrition Goals Re-Evaluation:  Nutrition Goals Re-Evaluation     Row Name 06/26/22 1549 07/29/22 1158           Goals   Nutrition Goal ST: review handouts, focus on lean meats, limit creamy or cheese sauces or add ons, and add fruit and vegetable servings to meals when going out LT: limit Na <2g, limit saturated fat <16g/day, aim for at least 30g of fiber/day Short: Try premade meal kits at [employer] Long: Continue to reduce meals out      Comment Logan Crawford was able to meet with dietitian and they talked about picking healthier choices when going out to eat.  John does not cook at home and finds it difficult.  Hei is still eating a lot of sodium from mulitple meals out.  He is trying to make better choices.  Trying to work on his cooking, we talked about trying pre-prepped meals that are easy to heat up (Not TV dinner, but store made kits).  He was open to trying meal kits at [employer]. Logan Crawford is doing well in rehab. He has  not had the premade meal kits and still eating out.  He has gotten better about what he eats, but his weight is up and still eating out  more so than not.  We also talked about how when he eats out the portions are bigger than what he would normally make/eat at home.      Expected Outcome Short: Try premade meal kits at [employer] Long: Continue to reduce meals out Short: Cut back on eating out Long: Continue to make healthier choices               Nutrition Goals Discharge (Final Nutrition Goals Re-Evaluation):  Nutrition Goals Re-Evaluation - 07/29/22 1158       Goals   Nutrition Goal Short: Try premade meal kits at [employer] Long: Continue to reduce meals out    Comment Logan Crawford is doing well in rehab. He has not had the premade meal kits and still eating out.  He has gotten better about what he eats, but his weight is up and still eating out more so than not.  We also talked about how when he eats out the portions are bigger than what he would normally make/eat at home.    Expected Outcome Short: Cut back on eating out Long: Continue to make healthier choices             Psychosocial: Target Goals: Acknowledge presence or absence of significant depression and/or stress, maximize coping skills, provide positive support system. Participant is able to verbalize types and ability to use techniques and skills needed for reducing stress and depression.   Education: Stress, Anxiety, and Depression - Group verbal and visual presentation to define topics covered.  Reviews how body is impacted by stress, anxiety, and depression.  Also discusses healthy ways to reduce stress and to treat/manage anxiety and depression.  Written material given at graduation.   Education: Sleep Hygiene -Provides group verbal and written instruction about how sleep can affect your health.  Define sleep hygiene, discuss sleep cycles and impact of sleep habits. Review good sleep hygiene tips.    Initial Review &  Psychosocial Screening:  Initial Psych Review & Screening - 05/13/22 1408       Initial Review   Current issues with Current Sleep Concerns      Family Dynamics   Good Support System? Yes      Barriers   Psychosocial barriers to participate in program There are no identifiable barriers or psychosocial needs.;The patient should benefit from training in stress management and relaxation.      Screening Interventions   Interventions Encouraged to exercise;Provide feedback about the scores to participant;To provide support and resources with identified psychosocial needs    Expected Outcomes Short Term goal: Utilizing psychosocial counselor, staff and physician to assist with identification of specific Stressors or current issues interfering with healing process. Setting desired goal for each stressor or current issue identified.;Long Term Goal: Stressors or current issues are controlled or eliminated.;Short Term goal: Identification and review with participant of any Quality of Life or Depression concerns found by scoring the questionnaire.;Long Term goal: The participant improves quality of Life and PHQ9 Scores as seen by post scores and/or verbalization of changes             Quality of Life Scores:   Quality of Life - 05/20/22 1411       Quality of Life   Select Quality of Life      Quality of Life Scores   Health/Function Pre 14.17 %    Socioeconomic Pre 15.07 %    Psych/Spiritual Pre 17.36 %    Family Pre 18.3 %    GLOBAL Pre  15.62 %            Scores of 19 and below usually indicate a poorer quality of life in these areas.  A difference of  2-3 points is a clinically meaningful difference.  A difference of 2-3 points in the total score of the Quality of Life Index has been associated with significant improvement in overall quality of life, self-image, physical symptoms, and general health in studies assessing change in quality of life.  PHQ-9: Review Flowsheet        05/20/2022 03/14/2022  Depression screen PHQ 2/9  Decreased Interest 0 0  Down, Depressed, Hopeless 0 0  PHQ - 2 Score 0 0  Altered sleeping 0 -  Tired, decreased energy 2 -  Change in appetite 0 -  Feeling bad or failure about yourself  0 -  Trouble concentrating 0 -  Moving slowly or fidgety/restless 0 -  Suicidal thoughts 0 -  PHQ-9 Score 2 -  Difficult doing work/chores Not difficult at all -   Interpretation of Total Score  Total Score Depression Severity:  1-4 = Minimal depression, 5-9 = Mild depression, 10-14 = Moderate depression, 15-19 = Moderately severe depression, 20-27 = Severe depression   Psychosocial Evaluation and Intervention:  Psychosocial Evaluation - 05/13/22 1412       Psychosocial Evaluation & Interventions   Interventions Encouraged to exercise with the program and follow exercise prescription    Comments Scoey 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 He is wearing a nicotine patch on and off and is trying to quit completly. He states he doesn't have really any stress concerns besides when he is in pain it makes things more difficult. He does report sleep concerns, that it is hard to stay asleep. He also needs a sleep study to evaluate for sleep apnea. He really enjoys playing pool and is back to playing since his CABG. He wants to work on exercising more and understanding how to live a heart healthy lifestyle.    Expected Outcomes Short: attend cardiac rehab for education and exercise. Long: develop and maintain positive self care habits.    Continue Psychosocial Services  Follow up required by staff             Psychosocial Re-Evaluation:  Psychosocial Re-Evaluation     Row Name 06/26/22 1546 07/29/22 1156           Psychosocial Re-Evaluation   Current issues with Current Stress Concerns;Current Sleep Concerns Current Stress Concerns;Current Sleep Concerns      Comments Logan Crawford is doing well in  rehab.  He is working on getting set up for a second sleep study on 5/14 as his CPAP is not working well for him.  He is only able to sleep for around 2 hours before waking routines.  He has also noted some swelling in his stomach and gained 20lb in one week.  He is also working with the doctor on this.  He is still working on quitting smoking and says stress is a contributing factor to not being able to drop last two.  We talked about trying to replace those with something else to help.  He was also encouraged to exercise more to help boost sleep and mood. Logan Crawford has been out sick for a couple of weeks.  He has not been working and still waiting to hear back about his sleep study.  He is also set up to meet  with pulmonology soon.  He continues to work on quitting smoking and we talked abotu trying substititue again.      Expected Outcomes Short: Work with docotrs on sleep study and stomach Long: Add in more exercise mental boost Short: Hear back about sleep study Long: Continue to exercise for mental boost      Interventions Stress management education;Encouraged to attend Cardiac Rehabilitation for the exercise --      Continue Psychosocial Services  Follow up required by staff --               Psychosocial Discharge (Final Psychosocial Re-Evaluation):  Psychosocial Re-Evaluation - 07/29/22 1156       Psychosocial Re-Evaluation   Current issues with Current Stress Concerns;Current Sleep Concerns    Comments Logan Crawford has been out sick for a couple of weeks.  He has not been working and still waiting to hear back about his sleep study.  He is also set up to meet with pulmonology soon.  He continues to work on quitting smoking and we talked abotu trying substititue again.    Expected Outcomes Short: Hear back about sleep study Long: Continue to exercise for mental boost             Vocational Rehabilitation: Provide vocational rehab assistance to qualifying candidates.   Vocational Rehab  Evaluation & Intervention:  Vocational Rehab - 05/13/22 1408       Initial Vocational Rehab Evaluation & Intervention   Assessment shows need for Vocational Rehabilitation No             Education: Education Goals: Education classes will be provided on a variety of topics geared toward better understanding of heart health and risk factor modification. Participant will state understanding/return demonstration of topics presented as noted by education test scores.  Learning Barriers/Preferences:  Learning Barriers/Preferences - 05/13/22 1408       Learning Barriers/Preferences   Learning Barriers None    Learning Preferences None             General Cardiac Education Topics:  AED/CPR: - Group verbal and written instruction with the use of models to demonstrate the basic use of the AED with the basic ABC's of resuscitation.   Anatomy and Cardiac Procedures: - Group verbal and visual presentation and models provide information about basic cardiac anatomy and function. Reviews the testing methods done to diagnose heart disease and the outcomes of the test results. Describes the treatment choices: Medical Management, Angioplasty, or Coronary Bypass Surgery for treating various heart conditions including Myocardial Infarction, Angina, Valve Disease, and Cardiac Arrhythmias.  Written material given at graduation. Flowsheet Row Cardiac Rehab from 05/20/2022 in [facility]  Education need identified 05/20/22       Medication Safety: - Group verbal and visual instruction to review commonly prescribed medications for heart and lung disease. Reviews the medication, class of the drug, and side effects. Includes the steps to properly store meds and maintain the prescription regimen.  Written material given at graduation.   Intimacy: - Group verbal instruction through game format to discuss how heart and lung disease can affect sexual intimacy. Written material  given at graduation..   Know Your Numbers and Heart Failure: - Group verbal and visual instruction to discuss disease risk factors for cardiac and pulmonary disease and treatment options.  Reviews associated critical values for Overweight/Obesity, Hypertension, Cholesterol, and Diabetes.  Discusses basics of heart failure: signs/symptoms and treatments.  Introduces Heart Failure Zone chart for action  plan for heart failure.  Written material given at graduation.   Infection Prevention: - Provides verbal and written material to individual with discussion of infection control including proper hand washing and proper equipment cleaning during exercise session. Flowsheet Row Cardiac Rehab from 05/20/2022 in [facility] Cardiac [facility] Rehab  Date 05/20/22  Educator NT  Instruction Review Code 1- Verbalizes Understanding       Falls Prevention: - Provides verbal and written material to individual with discussion of falls prevention and safety. Flowsheet Row Cardiac Rehab from 05/20/2022 in [facility] Cardiac and Pulmonary Rehab  Date 05/20/22  Educator NT  Instruction Review Code 1- Verbalizes Understanding       Other: -Provides group and verbal instruction on various topics (see comments)   Knowledge Questionnaire Score:  Knowledge Questionnaire Score - 05/20/22 1411       Knowledge Questionnaire Score   Pre Score 19/26             Core Components/Risk Factors/Patient Goals at Admission:  Personal Goals and Risk Factors at Admission - 05/13/22 1406       Core Components/Risk Factors/Patient Goals on Admission   Tobacco Cessation Yes   wearing patch   Number of packs per day 0    Expected Outcomes Long Term: Complete abstinence from all tobacco products for at least 12 months from quit date.    Hypertension Yes    Intervention Provide education on lifestyle modifcations including regular physical activity/exercise, weight management, moderate sodium restriction and increased  consumption of fresh fruit, vegetables, and low fat dairy, alcohol moderation, and smoking cessation.;Monitor prescription use compliance.    Expected Outcomes Short Term: Continued assessment and intervention until BP is < 140/70mm HG in hypertensive participants. < 130/85mm HG in hypertensive participants with diabetes, heart failure or chronic kidney disease.;Long Term: Maintenance of blood pressure at goal levels.             Education:Diabetes - Individual verbal and written instruction to review signs/symptoms of diabetes, desired ranges of glucose level fasting, after meals and with exercise. Acknowledge that pre and post exercise glucose checks will be done for 3 sessions at entry of program.   Core Components/Risk Factors/Patient Goals Review:   Goals and Risk Factor Review     Row Name 06/26/22 1552 07/29/22 1159           Core Components/Risk Factors/Patient Goals Review   Personal Goals Review Weight Management/Obesity;Tobacco Cessation;Hypertension Weight Management/Obesity;Tobacco Cessation;Hypertension      Review Logan Crawford is doing well in rehab.  He is working on weigh loss.  His weight went up 20 lb in one week and he went to doctor and they did xrays and red vest and could not find fluid.  He is still working with them and on lasix to help.  His pressures are doing well, but slightly elevated and could be related to his meals out. He is also still smoking, but down to 2-3 cigs a day.  When we talked about when they are, they are his usually after meal cigarettes.  He had used patches before and the nicotine makes him want to smoke more.  Thus, we talked about replacing his after meal cigarette with a different activity like chewing a straw or gum to help kick the habit.  He was open to trying that out. John returned to rehab today after being out sick.  His weight is still trending up with eating out and quitting smoking.  He is still at 2-3 cigarettes  a day.  We talked about  finding a substittute for those to put in his mouth.  His pressures are doing well but were up a little with being sick.  His biggest issue is his breathing still and he is set to meet with pulmolnologist.      Expected Outcomes Short: Try out gum or straw for after meals Long: conitnue to work on cessation and weight loss. Short: Continue to work on cessation Long: continue to work on [employer] loss               Core Components/Risk Factors/Patient Goals at Discharge (Final Review):   Goals and Risk Factor Review - 07/29/22 1159       Core Components/Risk Factors/Patient Goals Review   Personal Goals Review Weight Management/Obesity;Tobacco Cessation;Hypertension    Review John returned to rehab today after being out sick.  His weight is still trending up with eating out and quitting smoking.  He is still at 2-3 cigarettes a day.  We talked about finding a substittute for those to put in his mouth.  His pressures are doing well but were up a little with being sick.  His biggest issue is his breathing still and he is set to meet with pulmolnologist.    Expected Outcomes Short: Continue to work on cessation Long: continue to work on weight loss             ITP Comments:  ITP Comments     Row Name 05/13/22 1416 05/20/22 1402 05/27/22 1115 06/11/22 1156 07/09/22 0843   ITP Comments Initial phone call completed. Diagnosis can be found in [facility] 12/25. EP Orientation scheduled for Tuesday 3/26 at 9:30.  Joban has recently quit tobacco use within the last 6 months. Intervention for relapse prevention was provided at the initial medical review. He was encouraged to continue to with tobacco cessation and was provided information on relapse prevention. Patient received information about combination therapy, tobacco cessation classes, quit line, and quit smoking apps in case of a relapse. Patient demonstrated understanding of this material.Staff will continue to provide encouragement and follow up  with the patient throughout the program. Completed and gym orientation. Initial ITP created and sent for review to Dr. Bethann Punches, Medical Director. First full day of exercise!  Patient was oriented to gym and equipment including functions, settings, policies, and procedures.  Patient's individual exercise prescription and treatment plan were reviewed.  All starting workloads were established based on the results of the 6 minute walk test done at initial orientation visit.  The plan for exercise progression was also introduced and progression will be customized based on patient's performance and goals. 30 day review completed. ITP sent to Dr. Bethann Punches, Medical Director of Cardiac Rehab. Continue with ITP unless changes are made by physician.   Pt still new to program 30 Day review completed. Medical Director ITP review done, changes made as directed, and signed approval by Medical Director.    Row Name 08/06/22 0935           ITP Comments 30 Day review completed. Medical Director ITP review done, changes made as directed, and signed approval by Medical Director.                Comments:

## 2022-08-06 NOTE — Telephone Encounter (Signed)
Appointment scheduled for today at 4:15pm and the patient is aware.

## 2022-08-07 NOTE — Telephone Encounter (Signed)
Dr. Barron Alvine patient previously cleared by Cathlyn Parsons CRNA regarding ok to have procedure at [facility].  Cardiac clearance received from primary cardiologist and CHF cardiologist. Pulmonary clearance received. Patient to proceed with EGD/colonoscopy with you 08/20/2022 as scheduled.

## 2022-08-07 NOTE — Telephone Encounter (Signed)
Colleen-  Please see excerpt from Dr Doreene Adas office visit note 08/06/22 in response to our request for pulmonary risk assessment prior to procedure.     "#Pre-operative optimization   He is to undergo a low risk procedure (colonosocpy and EGD) and is currently optimized from a pulmonary perspective. Patient's risk for pulmonary complications is low, especially given low risk procedure, but his overall risk is low to moderate given concomitant heart failure. Would recommend utilization of BiPAP with above settings following anesthesia, and use of Duo-nebs for bronchodilation post procedure. ...."    Raechel Chute, MD [facility] 08/06/2022 5:54 PM

## 2022-08-07 NOTE — Telephone Encounter (Signed)
See 08/05/22 telephone note for additional communication regarding pulmonary assessment for upcoming procedure.

## 2022-08-08 ENCOUNTER — Telehealth: Payer: Self-pay | Admitting: Nurse Practitioner

## 2022-08-08 MED ORDER — NA SULFATE-K SULFATE-MG SULF 17.5-3.13-1.6 GM/177ML PO SOLN: ORAL | 0 refills | Status: DC

## 2022-08-08 NOTE — Telephone Encounter (Signed)
Inbound call from patient stating the cost of prep is too expensive and is looking to be prescribed something else. Please advise.

## 2022-08-08 NOTE — Telephone Encounter (Signed)
Contacted pt & advised pt to try Suprep & to let me know if it was too expensive. Instructions sent via mail.

## 2022-08-14 ENCOUNTER — Encounter: Payer: Medicaid Other | Admitting: *Deleted

## 2022-08-19 ENCOUNTER — Ambulatory Visit (INDEPENDENT_AMBULATORY_CARE_PROVIDER_SITE_OTHER): Payer: Medicaid Other | Admitting: Urology

## 2022-08-19 ENCOUNTER — Encounter: Payer: Self-pay | Admitting: Urology

## 2022-08-19 VITALS — BP 121/85 | HR 84 | Ht 72.0 in | Wt 243.0 lb

## 2022-08-19 DIAGNOSIS — N99115 Postprocedural fossa navicularis urethral stricture: Secondary | ICD-10-CM

## 2022-08-19 DIAGNOSIS — Z09 Encounter for follow-up examination after completed treatment for conditions other than malignant neoplasm: Secondary | ICD-10-CM

## 2022-08-19 DIAGNOSIS — Z87448 Personal history of other diseases of urinary system: Secondary | ICD-10-CM

## 2022-08-19 LAB — BLADDER SCAN AMB NON-IMAGING

## 2022-08-19 NOTE — Progress Notes (Signed)
   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  He was originally performing some self dilations and was doing well, however was hospitalized in March 2024 for cardiac issues and stopped catheterizing, at that point he noticed weakening of his urinary stream.  He was seen again in clinic on 05/13/2022 and a fossa navicularis stricture was again dilated in clinic.  At that point I recommended self dilations daily for 6 weeks, and then considering 2-3 times per week.  He has not catheterized for at least a few months at this point, and continues to do well.  He is urinating with a strong stream.  Denies any dysuria, split stream or weak stream.  PVR today is normal at 80ml.  Return precautions were discussed at length.  RTC 1 year PVR, can follow-up as needed at that time if doing well   Sondra Come, MD  [facility] [address], [address] [place], [place] [address] [phone]

## 2022-08-20 ENCOUNTER — Encounter: Payer: Self-pay | Admitting: Gastroenterology

## 2022-08-20 ENCOUNTER — Ambulatory Visit (AMBULATORY_SURGERY_CENTER): Payer: Medicaid Other | Admitting: Gastroenterology

## 2022-08-20 VITALS — BP 103/58 | HR 79 | Temp 98.2°F | Resp 12 | Ht 72.0 in | Wt 237.0 lb

## 2022-08-20 DIAGNOSIS — D120 Benign neoplasm of cecum: Secondary | ICD-10-CM

## 2022-08-20 DIAGNOSIS — Z1211 Encounter for screening for malignant neoplasm of colon: Secondary | ICD-10-CM

## 2022-08-20 DIAGNOSIS — K2101 Gastro-esophageal reflux disease with esophagitis, with bleeding: Secondary | ICD-10-CM

## 2022-08-20 DIAGNOSIS — D124 Benign neoplasm of descending colon: Secondary | ICD-10-CM

## 2022-08-20 DIAGNOSIS — K2951 Unspecified chronic gastritis with bleeding: Secondary | ICD-10-CM

## 2022-08-20 DIAGNOSIS — D125 Benign neoplasm of sigmoid colon: Secondary | ICD-10-CM | POA: Diagnosis not present

## 2022-08-20 DIAGNOSIS — D122 Benign neoplasm of ascending colon: Secondary | ICD-10-CM | POA: Diagnosis not present

## 2022-08-20 DIAGNOSIS — K2970 Gastritis, unspecified, without bleeding: Secondary | ICD-10-CM

## 2022-08-20 DIAGNOSIS — K641 Second degree hemorrhoids: Secondary | ICD-10-CM

## 2022-08-20 DIAGNOSIS — K219 Gastro-esophageal reflux disease without esophagitis: Secondary | ICD-10-CM | POA: Diagnosis not present

## 2022-08-20 DIAGNOSIS — K449 Diaphragmatic hernia without obstruction or gangrene: Secondary | ICD-10-CM

## 2022-08-20 DIAGNOSIS — K5730 Diverticulosis of large intestine without perforation or abscess without bleeding: Secondary | ICD-10-CM

## 2022-08-20 DIAGNOSIS — K649 Unspecified hemorrhoids: Secondary | ICD-10-CM

## 2022-08-20 DIAGNOSIS — K635 Polyp of colon: Secondary | ICD-10-CM | POA: Diagnosis not present

## 2022-08-20 MED ORDER — SODIUM CHLORIDE 0.9 % IV SOLN: 500.0000 mL | INTRAVENOUS | Status: DC

## 2022-08-20 NOTE — Patient Instructions (Addendum)
-Restart Plavix on 08/21/2022 at prior dose   -Handout on gastritis, hemorrhoids, hemorrhoid banding, diverticulosis, and polyps provided -await pathology results -repeat colonoscopy for surveillance recommended. Date to be determined when pathology result become available   -Continue present medications    YOU HAD AN ENDOSCOPIC PROCEDURE TODAY AT THE [facility]:   Refer to the procedure report that was given to you for any specific questions about what was found during the examination.  If the procedure report does not answer your questions, please call your gastroenterologist to clarify.  If you requested that your care partner not be given the details of your procedure findings, then the procedure report has been included in a sealed envelope for you to review at your convenience later.  YOU SHOULD EXPECT: Some feelings of bloating in the abdomen. Passage of more gas than usual.  Walking can help get rid of the air that was put into your GI tract during the procedure and reduce the bloating. If you had a lower endoscopy (such as a colonoscopy or flexible sigmoidoscopy) you may notice spotting of blood in your stool or on the toilet paper. If you underwent a bowel prep for your procedure, you may not have a normal bowel movement for a few days.  Please Note:  You might notice some irritation and congestion in your nose or some drainage.  This is from the oxygen used during your procedure.  There is no need for concern and it should clear up in a day or so.  SYMPTOMS TO REPORT IMMEDIATELY:  Following lower endoscopy (colonoscopy or flexible sigmoidoscopy):  Excessive amounts of blood in the stool  Significant tenderness or worsening of abdominal pains  Swelling of the abdomen that is new, acute  Fever of 100F or higher  Following upper endoscopy (EGD)  Vomiting of blood or coffee ground material  New chest pain or pain under the shoulder blades  Painful or persistently  difficult swallowing  New shortness of breath  Fever of 100F or higher  Black, tarry-looking stools  For urgent or emergent issues, a gastroenterologist can be reached at any hour by calling [phone]. Do not use MyChart messaging for urgent concerns.    DIET:  We do recommend a small meal at first, but then you may proceed to your regular diet.  Drink plenty of fluids but you should avoid alcoholic beverages for 24 hours.  ACTIVITY:  You should plan to take it easy for the rest of today and you should NOT DRIVE or use heavy machinery until tomorrow (because of the sedation medicines used during the test).    FOLLOW UP: Our staff will call the number listed on your records the next business day following your procedure.  We will call around 7:15- 8:00 am to check on you and address any questions or concerns that you may have regarding the information given to you following your procedure. If we do not reach you, we will leave a message.     If any biopsies were taken you will be contacted by phone or by letter within the next 1-3 weeks.  Please call us at [phone] if you have not heard about the biopsies in 3 weeks.    SIGNATURES/CONFIDENTIALITY: You and/or your care partner have signed paperwork which will be entered into your electronic medical record.  These signatures attest to the fact that that the information above on your After Visit Summary has been reviewed and is understood.  Full responsibility  of the confidentiality of this discharge information lies with you and/or your care-partner.

## 2022-08-20 NOTE — Op Note (Signed)
[facility] Patient Name: Logan Crawford Procedure Date: 08/20/2022 2:41 PM MRN: [id number] Endoscopist: Doristine Locks , MD, [id number] Age: 47 Referring MD:  Date of Birth: 05-08-1975 Gender: Male Account #: [id number] Procedure:                Upper GI endoscopy Indications:              Epigastric abdominal pain, Heartburn, Abdominal                            bloating Medicines:                Monitored Anesthesia Care Procedure:                Pre-Anesthesia Assessment:                           - Prior to the procedure, a History and Physical                            was performed, and patient medications and                            allergies were reviewed. The patient's tolerance of                            previous anesthesia was also reviewed. The risks                            and benefits of the procedure and the sedation                            options and risks were discussed with the patient.                            All questions were answered, and informed consent                            was obtained. Prior Anticoagulants: The patient has                            taken Plavix (clopidogrel), last dose was day of                            procedure. ASA Grade Assessment: III - A patient                            with severe systemic disease. After reviewing the                            risks and benefits, the patient was deemed in                            satisfactory condition to undergo the procedure.  After obtaining informed consent, the endoscope was                            passed under direct vision. Throughout the                            procedure, the patient's blood pressure, pulse, and                            oxygen saturations were monitored continuously. The                            Olympus Scope [id number] was introduced through the                            mouth, and advanced to the  second part of duodenum.                            The upper GI endoscopy was accomplished without                            difficulty. The patient tolerated the procedure                            well. Scope In: Scope Out: Findings:                 One tongue of salmon-colored mucosa was present                            from 37 to 38 cm. No other visible abnormalities                            were present. Biopsies were taken with a cold                            forceps for histology. Estimated blood loss was                            minimal.                           A 2 cm sliding type hiatal hernia was present.                           The gastroesophageal flap valve was visualized                            endoscopically and classified as Hill Grade III                            (minimal fold, loose to endoscope, hiatal hernia                            likely).  Patchy mildly erythematous mucosa without bleeding                            was found in the gastric body and in the gastric                            antrum. Biopsies were taken with a cold forceps for                            Helicobacter pylori testing. Estimated blood loss                            was minimal.                           The examined duodenum was normal. Complications:            No immediate complications. Estimated Blood Loss:     Estimated blood loss was minimal. Impression:               - Salmon-colored mucosa. Biopsied.                           - 2 cm hiatal hernia.                           - Gastroesophageal flap valve classified as Hill                            Grade III (minimal fold, loose to endoscope, hiatal                            hernia likely).                           - Erythematous mucosa in the gastric body and                            antrum. Biopsied.                           - Normal examined duodenum. Recommendation:            - Patient has a contact number available for                            emergencies. The signs and symptoms of potential                            delayed complications were discussed with the                            patient. Return to normal activities tomorrow.                            Written discharge instructions were provided to the  patient.                           - Resume previous diet.                           - Continue present medications.                           - Await pathology results. Doristine Locks, MD 08/20/2022 3:38:46 PM

## 2022-08-20 NOTE — Op Note (Signed)
[facility] Patient Name: Logan Crawford Procedure Date: 08/20/2022 2:40 PM MRN: [id number] Endoscopist: Doristine Locks , MD, [id number] Age: 47 Referring MD:  Date of Birth: 1975-08-27 Gender: Male Account #: [id number] Procedure:                Colonoscopy Indications:              Screening for colorectal malignant neoplasm, This                            is the patient's first colonoscopy                           Separately, has intermittent hematochezia. Medicines:                Monitored Anesthesia Care Procedure:                Pre-Anesthesia Assessment:                           - Prior to the procedure, a History and Physical                            was performed, and patient medications and                            allergies were reviewed. The patient's tolerance of                            previous anesthesia was also reviewed. The risks                            and benefits of the procedure and the sedation                            options and risks were discussed with the patient.                            All questions were answered, and informed consent                            was obtained. Prior Anticoagulants: The patient has                            taken Plavix (clopidogrel), last dose was day of                            procedure. ASA Grade Assessment: III - A patient                            with severe systemic disease. After reviewing the                            risks and benefits, the patient was deemed in  satisfactory condition to undergo the procedure.                           After obtaining informed consent, the colonoscope                            was passed under direct vision. Throughout the                            procedure, the patient's blood pressure, pulse, and                            oxygen saturations were monitored continuously. The                            CF [id number]  #[id number] was introduced through the anus                            and advanced to the the cecum, identified by                            appendiceal orifice and ileocecal valve. The                            colonoscopy was performed without difficulty. The                            patient tolerated the procedure well. The quality                            of the bowel preparation was good. The ileocecal                            valve, appendiceal orifice, and rectum were                            photographed. Scope In: 2:53:25 PM Scope Out: 3:19:39 PM Scope Withdrawal Time: 0 hours 21 minutes 47 seconds  Total Procedure Duration: 0 hours 26 minutes 14 seconds  Findings:                 Hemorrhoids were found on perianal exam.                           Six sessile polyps were found in the ascending                            colon and cecum. The polyps were 2 to 6 mm in size.                            These polyps were removed with a cold snare.                            Resection and retrieval were complete. Estimated  blood loss was minimal.                           Five sessile polyps were found in the sigmoid colon                            and descending colon. The polyps were 3 to 5 mm in                            size. These polyps were removed with a cold snare.                            Resection and retrieval were complete. Estimated                            blood loss was minimal.                           Multiple medium-mouthed and small-mouthed                            diverticula were found in the sigmoid colon.                           Non-bleeding internal hemorrhoids were found during                            retroflexion. The hemorrhoids were medium-sized and                            Grade II (internal hemorrhoids that prolapse but                            reduce spontaneously).                           The  ascending colon revealed moderately excessive                            looping. Advancing the scope required using manual                            pressure. Complications:            No immediate complications. Estimated Blood Loss:     Estimated blood loss was minimal. Impression:               - Hemorrhoids found on perianal exam.                           - Six 2 to 6 mm polyps in the ascending colon and                            in the cecum, removed with a cold snare. Resected  and retrieved.                           - Five 3 to 5 mm polyps in the sigmoid colon and in                            the descending colon, removed with a cold snare.                            Resected and retrieved.                           - Diverticulosis in the sigmoid colon.                           - Non-bleeding internal hemorrhoids.                           - There was significant looping of the colon. Recommendation:           - Patient has a contact number available for                            emergencies. The signs and symptoms of potential                            delayed complications were discussed with the                            patient. Return to normal activities tomorrow.                            Written discharge instructions were provided to the                            patient.                           - Resume previous diet.                           - Continue present medications.                           - Resume Plavix (clopidogrel) at prior dose                            tomorrow.                           - Await pathology results.                           - Repeat colonoscopy for surveillance based on                            pathology results.                           -  Return to GI clinic PRN.                           - Internal hemorrhoids were noted on this study and                            may be amenable to  hemorrhoid band ligation. If you                            are interested in further treatment of these                            hemorrhoids with band ligation, please contact my                            clinic to set up an appointment for evaluation and                            treatment. Doristine Locks, MD 08/20/2022 3:40:39 PM

## 2022-08-20 NOTE — Progress Notes (Signed)
Called to room to assist during endoscopic procedure.  Patient ID and intended procedure confirmed with present staff. Received instructions for my participation in the procedure from the performing physician.  

## 2022-08-20 NOTE — Progress Notes (Signed)
Sedate, gd SR's, VSS, report to RN 

## 2022-08-20 NOTE — Progress Notes (Signed)
GASTROENTEROLOGY PROCEDURE H&P NOTE   Primary Care Physician: Donell Beers, FNP    Reason for Procedure:  Epigastric pain, bloating, heartburn, CRC screening, hematochezia  Plan:    EGD, colonoscopy  Patient is appropriate for endoscopic procedure(s) in the ambulatory ([facility]) setting.  The nature of the procedure, as well as the risks, benefits, and alternatives were carefully and thoroughly reviewed with the patient. Ample time for discussion and questions allowed. The patient understood, was satisfied, and agreed to proceed.     HPI: Logan Crawford is a 47 y.o. male who presents for EGD and colonoscopy for evaluation of multiple GI symptoms to include epigastric pain, bloating, heartburn, along with CRC screening and intermittent BRBPR. No previous EGD/Colonoscopy.   Has received clearance from Cardiology and Pulmonary to proceed with procedures today. Had clearance to hold Plavix for 2-3 days, but actually continued taking, last dose being this AM. Discussed potential for increased bleeding risks along with modification of intra-operative plan due to active Plavix use, and he would like to proceed.   Past Medical History:  Diagnosis Date   CAD (coronary artery disease)    a. 11/2019 NSTEMI/PCI: LM min irregs, LAD 55m (2.75x22 Resolute Onyx DES), 40d, D2 min irregs, LCX/LPAV min irregs, RCA 60ost, 40p/m, EF 45-50%; b. 11/2020 MV: EF 43%, small area of apical ischemia ->? over processing-->low risk.   Hyperlipidemia    Hypertension    Ischemic cardiomyopathy    a. 11/2019 LV Gram: EF 45-50%; b. 12/2019 Echo: EF 50% w/ mod mid-apical anteroseptal HK, GrI DD, nl RV fxn, mild Ao sclerosis.   Tobacco use 11/25/2019    Past Surgical History:  Procedure Laterality Date   CORONARY ARTERY BYPASS GRAFT N/A 02/19/2022   Procedure: CORONARY ARTERY BYPASS GRAFTING (CABG) X THREE, USING ENDOSCOPICALLY HARVESTED RIGHT GREATER SAPHENOUS VEIN  AND LEFT ATRIAL APPENDAGE CLIPPING;   Surgeon: Lyn Hollingshead, MD;  Location: [facility];  Service: Open Heart Surgery;  Laterality: N/A;   CORONARY STENT INTERVENTION N/A 01/24/2020   Procedure: CORONARY STENT INTERVENTION;  Surgeon: Iran Ouch, MD;  Location: [facility];  Service: Cardiovascular;  Laterality: N/A;   CORONARY STENT INTERVENTION N/A 05/20/2021   Procedure: CORONARY STENT INTERVENTION;  Surgeon: Iran Ouch, MD;  Location: [facility];  Service: Cardiovascular;  Laterality: N/A;   CORONARY ULTRASOUND/IVUS N/A 05/20/2021   Procedure: Intravascular Ultrasound/IVUS;  Surgeon: Iran Ouch, MD;  Location: [facility];  Service: Cardiovascular;  Laterality: N/A;   IRRIGATION AND DEBRIDEMENT KNEE Left    LEFT HEART CATH AND CORONARY ANGIOGRAPHY N/A 01/24/2020   Procedure: LEFT HEART CATH AND CORONARY ANGIOGRAPHY;  Surgeon: Iran Ouch, MD;  Location: [facility];  Service: Cardiovascular;  Laterality: N/A;   LEFT HEART CATH AND CORONARY ANGIOGRAPHY N/A 11/18/2021   Procedure: LEFT HEART CATH AND CORONARY ANGIOGRAPHY;  Surgeon: Iran Ouch, MD;  Location: [facility];  Service: Cardiovascular;  Laterality: N/A;   LEFT HEART CATH AND CORONARY ANGIOGRAPHY N/A 02/13/2022   Procedure: LEFT HEART CATH AND CORONARY ANGIOGRAPHY;  Surgeon: Marykay Lex, MD;  Location: [facility];  Service: Cardiovascular;  Laterality: N/A;   LEFT HEART CATH AND CORS/GRAFTS ANGIOGRAPHY N/A 05/20/2021   Procedure: LEFT HEART CATH AND CORS/GRAFTS ANGIOGRAPHY;  Surgeon: Iran Ouch, MD;  Location: [facility];  Service: Cardiovascular;  Laterality: N/A;   TYMPANOSTOMY TUBE PLACEMENT Bilateral     Prior to Admission medications  Medication Sig Start Date End Date Taking? Authorizing Provider  aspirin EC 81 MG tablet Take 1 tablet (81 mg total) by mouth daily. Swallow whole. 03/17/22   Sondra Barges, PA-C  clopidogrel (PLAVIX) 75 MG tablet Take 1 tablet (75  mg total) by mouth daily. 03/17/22   Dunn, Raymon Mutton, PA-C  empagliflozin (JARDIANCE) 10 MG TABS tablet Take 10 mg by mouth daily.    [provider]  famotidine (PEPCID) 20 MG tablet Take 1 tablet (20 mg total) by mouth 2 (two) times daily. 03/14/22   Donell Beers, FNP  Fluticasone-Umeclidin-Vilant (TRELEGY ELLIPTA) 100-62.5-25 MCG/ACT AEPB Inhale 1 puff into the lungs daily. 08/06/22   Raechel Chute, MD  furosemide (LASIX) 40 MG tablet Take 1 tablet (40 mg total) by mouth daily. 03/17/22   Dunn, Raymon Mutton, PA-C  gabapentin (NEURONTIN) 100 MG capsule Take 1 capsule (100 mg total) by mouth 2 (two) times daily. 05/08/22   Bensimhon, Bevelyn Buckles, MD  losartan (COZAAR) 50 MG tablet Take 1 tablet (50 mg total) by mouth daily. 03/17/22   Sondra Barges, PA-C  metoprolol succinate (TOPROL-XL) 25 MG 24 hr tablet Take 2 tablets (50 mg total) by mouth daily. Stop taking metoprolol if you continue to use cocaine 05/08/22   Bensimhon, Bevelyn Buckles, MD  Na Sulfate-K Sulfate-Mg Sulf 17.5-3.13-1.6 GM/177ML SOLN Use as directed; may use generic; goodrx card if insurance will not cover generic 08/08/22   Arnaldo Natal, NP  nicotine (NICODERM CQ - DOSED IN MG/24 HOURS) 21 mg/24hr patch Place 1 patch (21 mg total) onto the skin daily. 05/04/22   Arnetha Courser, MD  nitroGLYCERIN (NITROSTAT) 0.4 MG SL tablet Place 1 tablet (0.4 mg total) under the tongue every 5 (five) minutes as needed for chest pain (if chest pain not resolved after 3 doses, please call 911). 03/17/22   Shea Evans, Raymon Mutton, PA-C  pantoprazole (PROTONIX) 40 MG tablet Take 1 tablet (40 mg total) by mouth 2 (two) times daily. 05/03/22   Arnetha Courser, MD  rosuvastatin (CRESTOR) 40 MG tablet Take 1 tablet (40 mg total) by mouth daily. 04/25/22   Flossie Dibble, NP  Sod Picosulfate-Mag Ox-Cit Acd (CLENPIQ) 10-3.5-12 MG-GM -GM/160ML SOLN Take 1 kit by mouth as directed. 07/11/22   Arnaldo Natal, NP  spironolactone (ALDACTONE) 25 MG tablet Take 1 tablet  (25 mg total) by mouth daily. 03/17/22   Sondra Barges, PA-C  VENTOLIN HFA 108 [phone] Base) MCG/ACT inhaler SMARTSIG:1 Via Inhaler Daily 06/18/22   [provider]    Current Outpatient Medications  Medication Sig Dispense Refill   aspirin EC 81 MG tablet Take 1 tablet (81 mg total) by mouth daily. Swallow whole. 90 tablet 3   clopidogrel (PLAVIX) 75 MG tablet Take 1 tablet (75 mg total) by mouth daily. 90 tablet 3   empagliflozin (JARDIANCE) 10 MG TABS tablet Take 10 mg by mouth daily.     famotidine (PEPCID) 20 MG tablet Take 1 tablet (20 mg total) by mouth 2 (two) times daily. 60 tablet 3   Fluticasone-Umeclidin-Vilant (TRELEGY ELLIPTA) 100-62.5-25 MCG/ACT AEPB Inhale 1 puff into the lungs daily. 28 each 0   furosemide (LASIX) 40 MG tablet Take 1 tablet (40 mg total) by mouth daily. 90 tablet 3   gabapentin (NEURONTIN) 100 MG capsule Take 1 capsule (100 mg total) by mouth 2 (two) times daily. 60 capsule 2   losartan (COZAAR) 50 MG tablet Take 1 tablet (50 mg total) by mouth daily. 90 tablet  3   metoprolol succinate (TOPROL-XL) 25 MG 24 hr tablet Take 2 tablets (50 mg total) by mouth daily. Stop taking metoprolol if you continue to use cocaine 180 tablet 3   Na Sulfate-K Sulfate-Mg Sulf 17.5-3.13-1.6 GM/177ML SOLN Use as directed; may use generic; goodrx card if insurance will not cover generic 354 mL 0   nicotine (NICODERM CQ - DOSED IN MG/24 HOURS) 21 mg/24hr patch Place 1 patch (21 mg total) onto the skin daily. 28 patch 0   nitroGLYCERIN (NITROSTAT) 0.4 MG SL tablet Place 1 tablet (0.4 mg total) under the tongue every 5 (five) minutes as needed for chest pain (if chest pain not resolved after 3 doses, please call 911). 25 tablet 2   pantoprazole (PROTONIX) 40 MG tablet Take 1 tablet (40 mg total) by mouth 2 (two) times daily. 60 tablet 1   rosuvastatin (CRESTOR) 40 MG tablet Take 1 tablet (40 mg total) by mouth daily. 90 tablet 3   Sod Picosulfate-Mag Ox-Cit Acd (CLENPIQ) 10-3.5-12 MG-GM  -GM/160ML SOLN Take 1 kit by mouth as directed. 320 mL 0   spironolactone (ALDACTONE) 25 MG tablet Take 1 tablet (25 mg total) by mouth daily. 90 tablet 3   VENTOLIN HFA 108 (90 Base) MCG/ACT inhaler SMARTSIG:1 Via Inhaler Daily     No current facility-administered medications for this visit.    Allergies as of 08/20/2022 - Review Complete 08/19/2022  Allergen Reaction Noted   Bee pollen  06/20/2022    Family History  Problem Relation Age of Onset   Coronary artery disease Mother    Arrhythmia Mother        Pacemaker placed in 2021   Heart failure Mother    Breast cancer Mother    Coronary artery disease Father    Colon cancer Father    Colon polyps Father     Social History   Socioeconomic History   Marital status: Single    Spouse name: Not on file   Number of children: 3   Years of education: Not on file   Highest education level: Not on file  Occupational History   Not on file  Tobacco Use   Smoking status: Every Day    Packs/day: 1.00    Years: 10.00    Additional pack years: 0.00    Total pack years: 10.00    Types: Cigarettes    Passive exposure: Past   Smokeless tobacco: Never   Tobacco comments:    2-3 cigarettes daily- 08/06/2022  Vaping Use   Vaping Use: Never used  Substance and Sexual Activity   Alcohol use: Yes    Alcohol/week: 1.0 standard drink of alcohol    Types: 1 Shots of liquor per week    Comment: socially   Drug use: Yes    Types: Codeine   Sexual activity: Yes  Other Topics Concern   Not on file  Social History Narrative   Lives with his uncle    Social Determinants of Health   Financial Resource Strain: Not on file  Food Insecurity: No Food Insecurity (05/01/2022)   Hunger Vital Sign    Worried About Running Out of Food in the Last Year: Never true    Ran Out of Food in the Last Year: Never true  Transportation Needs: No Transportation Needs (05/01/2022)   PRAPARE - Administrator, Civil Service (Medical): No    Lack  of Transportation (Non-Medical): No  Physical Activity: Not on file  Stress: Not on file  Social Connections: Not on file  Intimate Partner Violence: Not At Risk (05/01/2022)   Humiliation, Afraid, Rape, and Kick questionnaire    Fear of Current or Ex-Partner: No    Emotionally Abused: No    Physically Abused: No    Sexually Abused: No    Physical Exam: Vital signs in last 24 hours: @There  were no vitals taken for this visit. GEN: NAD EYE: Sclerae anicteric ENT: MMM CV: Non-tachycardic Pulm: CTA b/l GI: Soft, NT/ND NEURO:  Alert & Oriented x 3   Doristine Locks, DO [facility]   08/20/2022 2:22 PM

## 2022-08-20 NOTE — Progress Notes (Signed)
Pt's states no medical or surgical changes since previsit or office visit. 

## 2022-08-21 ENCOUNTER — Encounter: Payer: Medicaid Other | Admitting: *Deleted

## 2022-08-21 ENCOUNTER — Telehealth: Payer: Self-pay

## 2022-08-21 NOTE — Telephone Encounter (Signed)
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 Thanks.

## 2022-08-21 NOTE — Telephone Encounter (Signed)
Faxed referral to [facility] - Colorectal Surgery at [phone]

## 2022-08-21 NOTE — Telephone Encounter (Signed)
  Follow up Call-     08/20/2022    2:31 PM  Call back number  Post procedure Call Back phone  # [phone]  Permission to leave phone message No    Post op call attempted, no answer, no WM.

## 2022-08-26 ENCOUNTER — Encounter: Payer: Medicaid Other | Attending: Cardiology

## 2022-08-26 DIAGNOSIS — Z951 Presence of aortocoronary bypass graft: Secondary | ICD-10-CM | POA: Insufficient documentation

## 2022-09-02 ENCOUNTER — Encounter: Payer: Self-pay | Admitting: *Deleted

## 2022-09-02 DIAGNOSIS — Z951 Presence of aortocoronary bypass graft: Secondary | ICD-10-CM

## 2022-09-02 NOTE — Progress Notes (Signed)
Cardiac Individual Treatment Plan  Patient Details  Name: Logan Crawford MRN: [id number] Date of Birth: 20-Dec-1975 Referring Provider:   Flowsheet Row Cardiac Rehab from 05/20/2022 in [facility] Cardiac and Pulmonary Rehab  Referring Provider Dr. Debbe Odea, MD       Initial Encounter Date:  Flowsheet Row Cardiac Rehab from 05/20/2022 in [facility] Cardiac and Pulmonary Rehab  Date 05/20/22       Visit Diagnosis: S/P CABG x 3  Patient's Home Medications on Admission:  Current Outpatient Medications:    aspirin EC 81 MG tablet, Take 1 tablet (81 mg total) by mouth daily. Swallow whole., Disp: 90 tablet, Rfl: 3   clopidogrel (PLAVIX) 75 MG tablet, Take 1 tablet (75 mg total) by mouth daily., Disp: 90 tablet, Rfl: 3   empagliflozin (JARDIANCE) 10 MG TABS tablet, Take 10 mg by mouth daily., Disp: , Rfl:    famotidine (PEPCID) 20 MG tablet, Take 1 tablet (20 mg total) by mouth 2 (two) times daily., Disp: 60 tablet, Rfl: 3   Fluticasone-Umeclidin-Vilant (TRELEGY ELLIPTA) 100-62.5-25 MCG/ACT AEPB, Inhale 1 puff into the lungs daily., Disp: 28 each, Rfl: 0   furosemide (LASIX) 40 MG tablet, Take 1 tablet (40 mg total) by mouth daily., Disp: 90 tablet, Rfl: 3   gabapentin (NEURONTIN) 100 MG capsule, Take 1 capsule (100 mg total) by mouth 2 (two) times daily., Disp: 60 capsule, Rfl: 2   losartan (COZAAR) 50 MG tablet, Take 1 tablet (50 mg total) by mouth daily., Disp: 90 tablet, Rfl: 3   metoprolol succinate (TOPROL-XL) 25 MG 24 hr tablet, Take 2 tablets (50 mg total) by mouth daily. Stop taking metoprolol if you continue to use cocaine, Disp: 180 tablet, Rfl: 3   nicotine (NICODERM CQ - DOSED IN MG/24 HOURS) 21 mg/24hr patch, Place 1 patch (21 mg total) onto the skin daily., Disp: 28 patch, Rfl: 0   nitroGLYCERIN (NITROSTAT) 0.4 MG SL tablet, Place 1 tablet (0.4 mg total) under the tongue every 5 (five) minutes as needed for chest pain (if chest pain not resolved after 3 doses, please call 911).,  Disp: 25 tablet, Rfl: 2   pantoprazole (PROTONIX) 40 MG tablet, Take 1 tablet (40 mg total) by mouth 2 (two) times daily., Disp: 60 tablet, Rfl: 1   rosuvastatin (CRESTOR) 40 MG tablet, Take 1 tablet (40 mg total) by mouth daily., Disp: 90 tablet, Rfl: 3   spironolactone (ALDACTONE) 25 MG tablet, Take 1 tablet (25 mg total) by mouth daily. (Patient not taking: Reported on 08/20/2022), Disp: 90 tablet, Rfl: 3   VENTOLIN HFA 108 (90 Base) MCG/ACT inhaler, SMARTSIG:1 Via Inhaler Daily, Disp: , Rfl:   Current Facility-Administered Medications:    0.9 %  sodium chloride infusion, 500 mL, Intravenous, Continuous, Cirigliano, Vito V, DO  Past Medical History: Past Medical History:  Diagnosis Date   CAD (coronary artery disease)    a. 11/2019 NSTEMI/PCI: LM min irregs, LAD 42m (2.75x22 Resolute Onyx DES), 40d, D2 min irregs, LCX/LPAV min irregs, RCA 60ost, 40p/m, EF 45-50%; b. 11/2020 MV: EF 43%, small area of apical ischemia ->? over processing-->low risk.   Hyperlipidemia    Hypertension    Ischemic cardiomyopathy    a. 11/2019 LV Gram: EF 45-50%; b. 12/2019 Echo: EF 50% w/ mod mid-apical anteroseptal HK, GrI DD, nl RV fxn, mild Ao sclerosis.   Tobacco use 11/25/2019    Tobacco Use: Social History   Tobacco Use  Smoking Status Every Day   Packs/day: 1.00   Years: 10.00  Additional pack years: 0.00   Total pack years: 10.00   Types: Cigarettes   Passive exposure: Past  Smokeless Tobacco Never  Tobacco Comments   2-3 cigarettes daily- 08/06/2022    Labs: Review Flowsheet  More data exists      Latest Ref Rng & Units 02/21/2022 02/22/2022 02/23/2022 04/14/2022 05/02/2022  Labs for ITP Cardiac and Pulmonary Rehab  Cholestrol 0 - 200 mg/dL - - - - 981   LDL (calc) 0 - 99 mg/dL - - - - 46   HDL-C >19 mg/dL - - - - 26   Trlycerides <150 mg/dL - - - - 147   Hemoglobin A1c 4.8 - 5.6 % - - - 5.2  -  O2 Saturation % 62.6  44.6  52.6  44.8  - -     Exercise Target Goals: Exercise  Program Goal: Individual exercise prescription set using results from initial 6 min walk test and THRR while considering  patient's activity barriers and safety.   Exercise Prescription Goal: Initial exercise prescription builds to 30-45 minutes a day of aerobic activity, 2-3 days per week.  Home exercise guidelines will be given to patient during program as part of exercise prescription that the participant will acknowledge.   Education: Aerobic Exercise: - Group verbal and visual presentation on the components of exercise prescription. Introduces F.I.T.T principle from ACSM for exercise prescriptions.  Reviews F.I.T.T. principles of aerobic exercise including progression. Written material given at graduation. Flowsheet Row Cardiac Rehab from 05/20/2022 in [facility] Cardiac and Pulmonary Rehab  Education need identified 05/20/22       Education: Resistance Exercise: - Group verbal and visual presentation on the components of exercise prescription. Introduces F.I.T.T principle from ACSM for exercise prescriptions  Reviews F.I.T.T. principles of resistance exercise including progression. Written material given at graduation.    Education: Exercise & Equipment Safety: - Individual verbal instruction and demonstration of equipment use and safety with use of the equipment. Flowsheet Row Cardiac Rehab from 05/20/2022 in [facility] Cardiac and Pulmonary Rehab  Date 05/20/22  Educator NT  Instruction Review Code 1- Verbalizes Understanding       Education: Exercise Physiology & General Exercise Guidelines: - Group verbal and written instruction with models to review the exercise physiology of the cardiovascular system and associated critical values. Provides general exercise guidelines with specific guidelines to those with heart or lung disease.    Education: Flexibility, Balance, Mind/Body Relaxation: - Group verbal and visual presentation with interactive activity on the components of exercise  prescription. Introduces F.I.T.T principle from ACSM for exercise prescriptions. Reviews F.I.T.T. principles of flexibility and balance exercise training including progression. Also discusses the mind body connection.  Reviews various relaxation techniques to help reduce and manage stress (i.e. Deep breathing, progressive muscle relaxation, and visualization). Balance handout provided to take home. Written material given at graduation.   Activity Barriers & Risk Stratification:  Activity Barriers & Cardiac Risk Stratification - 05/20/22 1429       Activity Barriers & Cardiac Risk Stratification   Activity Barriers Back Problems;Shortness of Breath   Occasional pain in knees, feet, and hands   Cardiac Risk Stratification High             6 Minute Walk:  6 Minute Walk     Row Name 05/20/22 1425         6 Minute Walk   Phase Initial     Distance 1215 feet     Walk Time 6 minutes     #  of Rest Breaks 0     MPH 2.3     METS 3.93     RPE 10     Perceived Dyspnea  1     VO2 Peak 13.74     Symptoms No     Resting HR 95 bpm     Resting BP 124/66     Resting Oxygen Saturation  98 %     Exercise Oxygen Saturation  during 6 min walk 97 %     Max Ex. HR 111 bpm     Max Ex. BP 132/74     2 Minute Post BP 116/72              Oxygen Initial Assessment:   Oxygen Re-Evaluation:   Oxygen Discharge (Final Oxygen Re-Evaluation):   Initial Exercise Prescription:  Initial Exercise Prescription - 05/20/22 1400       Date of Initial Exercise RX and Referring Provider   Date 05/20/22    Referring Provider Dr. Debbe Odea, MD      Oxygen   Maintain Oxygen Saturation 88% or higher      Treadmill   MPH 2    Grade 1    Minutes 15    METs 2.81      NuStep   Level 3    SPM 80    Minutes 15    METs 3.93      Recumbant Elliptical   Level 1.5    RPM 50    Minutes 15    METs 3.93      Prescription Details   Frequency (times per week) 2    Duration Progress  to 30 minutes of continuous aerobic without signs/symptoms of physical distress      Intensity   THRR 40-80% of Max Heartrate 125-156    Ratings of Perceived Exertion 11-13    Perceived Dyspnea 0-4      Progression   Progression Continue to progress workloads to maintain intensity without signs/symptoms of physical distress.      Resistance Training   Training Prescription Yes    Weight 4 lb    Reps 10-15             Perform Capillary Blood Glucose checks as needed.  Exercise Prescription Changes:   Exercise Prescription Changes     Row Name 05/20/22 1400 06/05/22 1400 06/17/22 1500 07/01/22 0800 07/15/22 1500     Response to Exercise   Blood Pressure (Admit) 124/66 112/66 108/60 146/74 132/70   Blood Pressure (Exercise) 132/74 144/78 116/68 126/74 152/74   Blood Pressure (Exit) 116/72 98/56 106/62 120/70 124/70   Heart Rate (Admit) 95 bpm 93 bpm 92 bpm 93 bpm 88 bpm   Heart Rate (Exercise) 111 bpm 113 bpm 110 bpm 110 bpm 124 bpm   Heart Rate (Exit) 98 bpm 97 bpm 87 bpm 94 bpm 105 bpm   Oxygen Saturation (Admit) 98 % -- -- -- --   Oxygen Saturation (Exercise) 97 % -- -- -- --   Rating of Perceived Exertion (Exercise) 10 13 15 13 17    Perceived Dyspnea (Exercise) 1 -- -- -- --   Symptoms None none none none none   Comments Results First two days of exercise -- -- --   Duration -- Continue with 30 min of aerobic exercise without signs/symptoms of physical distress. Continue with 30 min of aerobic exercise without signs/symptoms of physical distress. Continue with 30 min of aerobic exercise without signs/symptoms of physical distress. Continue with  30 min of aerobic exercise without signs/symptoms of physical distress.   Intensity -- THRR unchanged THRR unchanged THRR unchanged THRR unchanged     Progression   Progression -- Continue to progress workloads to maintain intensity without signs/symptoms of physical distress. Continue to progress workloads to maintain  intensity without signs/symptoms of physical distress. Continue to progress workloads to maintain intensity without signs/symptoms of physical distress. Continue to progress workloads to maintain intensity without signs/symptoms of physical distress.   Average METs -- 2.79 2.91 3.23 3.53     Resistance Training   Training Prescription -- Yes Yes Yes Yes   Weight -- 4 lb 4 lb 4 lb 4 lb   Reps -- 10-15 10-15 10-15 10-15     Interval Training   Interval Training -- No No No Yes   Equipment -- -- -- -- REL-XR   Comments -- -- -- -- X mode -intervals up to level 13     Treadmill   MPH -- 2 2.1 2.2 3   Grade -- 1 2 2 2    Minutes -- 15 15 15 15    METs -- 2.81 3.19 3.29 4.12     NuStep   Level -- 3 7 -- 5   Minutes -- 15 15 -- 15   METs -- 2.7 3.1 -- 3.1     Recumbant Elliptical   Level -- -- 3 -- 3   Minutes -- -- 15 -- 15   METs -- -- 2.1 -- 2.4     Elliptical   Level -- -- -- -- 1  5 min   Minutes -- -- -- -- 15     REL-XR   Level -- -- -- 7 13  intervals   Minutes -- -- -- 15 15   METs -- -- -- 3.2 --     Oxygen   Maintain Oxygen Saturation -- 88% or higher 88% or higher 88% or higher 88% or higher    Row Name 08/14/22 0800             Response to Exercise   Blood Pressure (Admit) 118/76       Blood Pressure (Exit) 114/76       Heart Rate (Admit) 97 bpm       Heart Rate (Exercise) 110 bpm       Heart Rate (Exit) 69 bpm       Rating of Perceived Exertion (Exercise) 15       Symptoms none       Duration Continue with 30 min of aerobic exercise without signs/symptoms of physical distress.       Intensity THRR unchanged         Progression   Progression Continue to progress workloads to maintain intensity without signs/symptoms of physical distress.       Average METs 2.94         Resistance Training   Training Prescription Yes       Weight 4 lb       Reps 10-15         Interval Training   Interval Training No         Treadmill   MPH 2.6       Grade 1        Minutes 15       METs 3.35         Recumbant Elliptical   Level 3       Minutes 15  METs 2.2         REL-XR   Level 1       Minutes 15         Oxygen   Maintain Oxygen Saturation 88% or higher                Exercise Comments:   Exercise Comments     Row Name 05/27/22 1115           Exercise Comments First full day of exercise!  Patient was oriented to gym and equipment including functions, settings, policies, and procedures.  Patient's individual exercise prescription and treatment plan were reviewed.  All starting workloads were established based on the results of the 6 minute walk test done at initial orientation visit.  The plan for exercise progression was also introduced and progression will be customized based on patient's performance and goals.                Exercise Goals and Review:   Exercise Goals     Row Name 05/20/22 1430             Exercise Goals   Increase Physical Activity Yes       Intervention Provide advice, education, support and counseling about physical activity/exercise needs.;Develop an individualized exercise prescription for aerobic and resistive training based on initial evaluation findings, risk stratification, comorbidities and participant's personal goals.       Expected Outcomes Short Term: Attend rehab on a regular basis to increase amount of physical activity.;Long Term: Exercising regularly at least 3-5 days a week.;Long Term: Add in home exercise to make exercise part of routine and to increase amount of physical activity.       Increase Strength and Stamina Yes       Intervention Provide advice, education, support and counseling about physical activity/exercise needs.;Develop an individualized exercise prescription for aerobic and resistive training based on initial evaluation findings, risk stratification, comorbidities and participant's personal goals.       Expected Outcomes Short Term: Increase workloads from  initial exercise prescription for resistance, speed, and METs.;Short Term: Perform resistance training exercises routinely during rehab and add in resistance training at home;Long Term: Improve cardiorespiratory fitness, muscular endurance and strength as measured by increased METs and functional capacity ( )       Able to understand and use rate of perceived exertion (RPE) scale Yes       Intervention Provide education and explanation on how to use RPE scale       Expected Outcomes Short Term: Able to use RPE daily in rehab to express subjective intensity level;Long Term:  Able to use RPE to guide intensity level when exercising independently       Able to understand and use Dyspnea scale Yes       Intervention Provide education and explanation on how to use Dyspnea scale       Expected Outcomes Short Term: Able to use Dyspnea scale daily in rehab to express subjective sense of shortness of breath during exertion;Long Term: Able to use Dyspnea scale to guide intensity level when exercising independently       Knowledge and understanding of Target Heart Rate Range (THRR) Yes       Intervention Provide education and explanation of THRR including how the numbers were predicted and where they are located for reference       Expected Outcomes Short Term: Able to state/look up THRR;Long Term: Able to use THRR to govern intensity when  exercising independently;Short Term: Able to use daily as guideline for intensity in rehab       Able to check pulse independently Yes       Intervention Provide education and demonstration on how to check pulse in carotid and radial arteries.;Review the importance of being able to check your own pulse for safety during independent exercise       Expected Outcomes Short Term: Able to explain why pulse checking is important during independent exercise;Long Term: Able to check pulse independently and accurately       Understanding of Exercise Prescription Yes        Intervention Provide education, explanation, and written materials on patient's individual exercise prescription       Expected Outcomes Short Term: Able to explain program exercise prescription;Long Term: Able to explain home exercise prescription to exercise independently                Exercise Goals Re-Evaluation :  Exercise Goals Re-Evaluation     Row Name 05/27/22 1115 06/05/22 1410 06/17/22 1542 06/26/22 1544 07/01/22 0848     Exercise Goal Re-Evaluation   Exercise Goals Review Increase Physical Activity;Able to understand and use rate of perceived exertion (RPE) scale;Knowledge and understanding of Target Heart Rate Range (THRR);Understanding of Exercise Prescription;Increase Strength and Stamina;Able to check pulse independently Increase Physical Activity;Increase Strength and Stamina;Understanding of Exercise Prescription Increase Physical Activity;Increase Strength and Stamina;Understanding of Exercise Prescription Increase Physical Activity;Increase Strength and Stamina;Understanding of Exercise Prescription Increase Physical Activity;Increase Strength and Stamina;Understanding of Exercise Prescription   Comments Reviewed RPE scale, THR and program prescription with pt today.  Pt voiced understanding and was given a copy of goals to take home. Logan Crawford is off to a good start in rehab. He did well with the treadmill at a speed of 2 mph with an incline of 1%. He also worked at level 3 on the T4 nustep during his first two sessions. He has done well with 4 lb hand weights for resistance training as well. We will continue to monitor his progress in the program. Logan Crawford continues to do well in rehab. He increased to level 7 on the T4 Nustep and up to level 3 on the REL. He increased his incline to 4% on the treadmill for 1 session. He is not quite hitting his THR  but RPEs are staying appropriate. We will continue to monitor. Logan Crawford is off to a good start in rehab.  He feels that he is struggling to get  here twice a week, but admits that it does get him up and out of bed.  We offered a later class time, but he said that he was goo.  He is not doing much outside of rehab yet, but plans to rejoin [employer] to start working out more. We will review home exercise with him soon.  He is a fan of the sauna at [facility]! Logan Crawford has only attended rehab twice since the last review. He increased his overall average MET level to 3.23 METs. He increased his speed on the treadmill to 2.2 mph while maintaining an incline of 2% as well.  He also improved to level 7 on the XR. We will continue to monitor his progress in the program.   Expected Outcomes Short: Use RPE daily to regulate intensity.  Long: Follow program prescription in THR. Short: Continue to follow current exercise prescription and progressively increase workloads. Long: Continue to improve strength and stamina. Short: Slowly progress incline back up beyond baseline  of 2% Long: Continue to increase overall MET level and stamina Short: Review home exercise guidelines Long: Add in more exercise on off days Short: Continue to progressively increase treadmill workload. Long: Continue to improve strength and stamina.    Row Name 07/15/22 1515 07/29/22 1154 07/31/22 1501 08/14/22 0854 08/27/22 1538     Exercise Goal Re-Evaluation   Exercise Goals Review Increase Physical Activity;Increase Strength and Stamina;Understanding of Exercise Prescription Increase Physical Activity;Increase Strength and Stamina;Understanding of Exercise Prescription Increase Physical Activity;Increase Strength and Stamina;Understanding of Exercise Prescription Increase Physical Activity;Increase Strength and Stamina;Understanding of Exercise Prescription Increase Physical Activity;Increase Strength and Stamina;Understanding of Exercise Prescription   Comments Logan Crawford continues to do well in rehab. He tried out the elliptical for the first time and was able to complete 5 minutes. He started doing  intervals up to level 13 on the XR.  He also increased to a 3.0 mph speed on the treadmill! We will continue to monitor. Logan Crawford has been out sick for the last two weeks.  His breathing has also gotten worse and he is scheduled to meet with a pulmonologist soon for a work up.  He has not been exercising with feeling sick, but he has been stretching more! Logan Crawford has not attended rehab since the last review due to being out sick. We will continue to monitor his progress once he returns to regular attendance in the program. Logan Crawford has only attended two rehab sessions since the last review after returning from being sick. His treadmill workload has decreased both the speed down to 2.6 mph and the incline down to 1%. He has continued to work at level 3 on the recumbent elliptical. He also has continued to use 4 lb hand weights for resistance training. We will continue to monitor his progress in the program. Logan Crawford has not attended rehab since the last review. We will call to check in on patient. We will continue to monitor his progress in the program.   Expected Outcomes Short: Continue to increase duration on elliptical slowly Long: Continue to increase overall MET level and stamina Short: Add exercise back into routine Long: Continue to improve stamina Short: Return to regular attendance in rehab. Long: Continue to improve strength and stamina. Short: Increase treadmill workload back up to previous speed and incline. Long: Continue to improve strength and stamina. Short: Return to regular attendance in the program. Long: Continue to increase strength and stamina.            Discharge Exercise Prescription (Final Exercise Prescription Changes):  Exercise Prescription Changes - 08/14/22 0800       Response to Exercise   Blood Pressure (Admit) 118/76    Blood Pressure (Exit) 114/76    Heart Rate (Admit) 97 bpm    Heart Rate (Exercise) 110 bpm    Heart Rate (Exit) 69 bpm    Rating of Perceived Exertion (Exercise)  15    Symptoms none    Duration Continue with 30 min of aerobic exercise without signs/symptoms of physical distress.    Intensity THRR unchanged      Progression   Progression Continue to progress workloads to maintain intensity without signs/symptoms of physical distress.    Average METs 2.94      Resistance Training   Training Prescription Yes    Weight 4 lb    Reps 10-15      Interval Training   Interval Training No      Treadmill   MPH 2.6    Grade  1    Minutes 15    METs 3.35      Recumbant Elliptical   Level 3    Minutes 15    METs 2.2      REL-XR   Level 1    Minutes 15      Oxygen   Maintain Oxygen Saturation 88% or higher             Nutrition:  Target Goals: Understanding of nutrition guidelines, daily intake of sodium 1500mg , cholesterol 200mg , calories 30% from fat and 7% or less from saturated fats, daily to have 5 or more servings of fruits and vegetables.  Education: All About Nutrition: -Group instruction provided by verbal, written material, interactive activities, discussions, models, and posters to present general guidelines for heart healthy nutrition including fat, fiber, MyPlate, the role of sodium in heart healthy nutrition, utilization of the nutrition label, and utilization of this knowledge for meal planning. Follow up email sent as well. Written material given at graduation. Flowsheet Row Cardiac Rehab from 05/20/2022 in [facility] Cardiac and Pulmonary Rehab  Education need identified 05/20/22       Biometrics:  Pre Biometrics - 05/20/22 1432       Pre Biometrics   Height 6' (1.829 m)    Weight 226 lb 12.8 oz (102.9 kg)    Waist Circumference 40.5 inches    Hip Circumference 43 inches    Waist to Hip Ratio 0.94 %    BMI (Calculated) 30.75    Single Leg Stand 30 seconds              Nutrition Therapy Plan and Nutrition Goals:  Nutrition Therapy & Goals - 05/20/22 1131       Nutrition Therapy   Diet Heart healthy, low  Na    Drug/Food Interactions Statins/Certain Fruits    Protein (specify units) 65g   CKD stg 3: 0.6g/kg/d   Fiber 30 grams    Whole Grain Foods 3 servings    Saturated Fats 16 max. grams    Fruits and Vegetables 8 servings/day    Sodium 2 grams      Personal Nutrition Goals   Nutrition Goal ST: review handouts, focus on lean meats, limit creamy or cheese sauces or add ons, and add fruit and vegetable servings to meals when going out LT: limit Na <2g, limit saturated fat <16g/day, aim for at least 30g of fiber/day    Comments 47 y.o. M admitted to cardiac rehab s/p CABG x 3. PMHx includes HTN, NSTEMI (2021 and 2023), CAD, chronic diastolic CHF, GERD, Stg 3a CKD, HLD, polysubstance abuse, former tobacco use, hypercalcemia. Medications reviewed symbicort, jardiance, pepcid, furosemide, nicotine, pantoprazole, rosuvastatin. B: sausage egg and cheese on toast or raisin bran L: [employer] or grilled chicken or ribeye steak sandwich - he has been trying to limit [employer] burger D: [place] food 2x/week, sushi, salmon S; chips or candy bars or pickles Drinks: water, gatorade (grape - regular) 2-3x/day and sweet tea 3-4x/day. He reports not cooking at home most of the time and would like to focus on modifications eating out as he does not want to cook - discussed some options to put together at home too like rotisserie chicken, microwave grains like brown rice, and frozen vegetables. Also discussed general heart healthy eating and considerations while eating out.      Intervention Plan   Intervention Prescribe, educate and counsel regarding individualized specific dietary modifications aiming towards targeted core components such as weight, hypertension, lipid management,  diabetes, heart failure and other comorbidities.;Nutrition handout(s) given to patient.    Expected Outcomes Short Term Goal: Understand basic principles of dietary content, such as calories, fat, sodium, cholesterol and nutrients.;Short Term  Goal: A plan has been developed with personal nutrition goals set during dietitian appointment.;Long Term Goal: Adherence to prescribed nutrition plan.             Nutrition Assessments:  MEDIFICTS Score Key: ?70 Need to make dietary changes  40-70 Heart Healthy Diet ? 40 Therapeutic Level Cholesterol Diet  Flowsheet Row Cardiac Rehab from 05/20/2022 in [facility] Cardiac and Pulmonary Rehab  Picture Your Plate Total Score on Admission 50      Picture Your Plate Scores: <16 Unhealthy dietary pattern with much room for improvement. 41-50 Dietary pattern unlikely to meet recommendations for good health and room for improvement. 51-60 More healthful dietary pattern, with some room for improvement.  >60 Healthy dietary pattern, although there may be some specific behaviors that could be improved.    Nutrition Goals Re-Evaluation:  Nutrition Goals Re-Evaluation     Row Name 06/26/22 1549 07/29/22 1158           Goals   Nutrition Goal ST: review handouts, focus on lean meats, limit creamy or cheese sauces or add ons, and add fruit and vegetable servings to meals when going out LT: limit Na <2g, limit saturated fat <16g/day, aim for at least 30g of fiber/day Short: Try premade meal kits at [employer] Long: Continue to reduce meals out      Comment Logan Crawford was able to meet with dietitian and they talked about picking healthier choices when going out to eat.  Logan Crawford does not cook at home and finds it difficult.  Hei is still eating a lot of sodium from mulitple meals out.  He is trying to make better choices.  Trying to work on his cooking, we talked about trying pre-prepped meals that are easy to heat up (Not TV dinner, but store made kits).  He was open to trying meal kits at [employer]. Logan Crawford is doing well in rehab. He has not had the premade meal kits and still eating out.  He has gotten better about what he eats, but his weight is up and still eating out more so than not.  We also talked about how when  he eats out the portions are bigger than what he would normally make/eat at home.      Expected Outcome Short: Try premade meal kits at [employer] Long: Continue to reduce meals out Short: Cut back on eating out Long: Continue to make healthier choices               Nutrition Goals Discharge (Final Nutrition Goals Re-Evaluation):  Nutrition Goals Re-Evaluation - 07/29/22 1158       Goals   Nutrition Goal Short: Try premade meal kits at [employer] Long: Continue to reduce meals out    Comment Logan Crawford is doing well in rehab. He has not had the premade meal kits and still eating out.  He has gotten better about what he eats, but his weight is up and still eating out more so than not.  We also talked about how when he eats out the portions are bigger than what he would normally make/eat at home.    Expected Outcome Short: Cut back on eating out Long: Continue to make healthier choices             Psychosocial: Target Goals: Acknowledge presence or  absence of significant depression and/or stress, maximize coping skills, provide positive support system. Participant is able to verbalize types and ability to use techniques and skills needed for reducing stress and depression.   Education: Stress, Anxiety, and Depression - Group verbal and visual presentation to define topics covered.  Reviews how body is impacted by stress, anxiety, and depression.  Also discusses healthy ways to reduce stress and to treat/manage anxiety and depression.  Written material given at graduation.   Education: Sleep Hygiene -Provides group verbal and written instruction about how sleep can affect your health.  Define sleep hygiene, discuss sleep cycles and impact of sleep habits. Review good sleep hygiene tips.    Initial Review & Psychosocial Screening:  Initial Psych Review & Screening - 05/13/22 1408       Initial Review   Current issues with Current Sleep Concerns      Family Dynamics   Good Support System? Yes       Barriers   Psychosocial barriers to participate in program There are no identifiable barriers or psychosocial needs.;The patient should benefit from training in stress management and relaxation.      Screening Interventions   Interventions Encouraged to exercise;Provide feedback about the scores to participant;To provide support and resources with identified psychosocial needs    Expected Outcomes Short Term goal: Utilizing psychosocial counselor, staff and physician to assist with identification of specific Stressors or current issues interfering with healing process. Setting desired goal for each stressor or current issue identified.;Long Term Goal: Stressors or current issues are controlled or eliminated.;Short Term goal: Identification and review with participant of any Quality of Life or Depression concerns found by scoring the questionnaire.;Long Term goal: The participant improves quality of Life and PHQ9 Scores as seen by post scores and/or verbalization of changes             Quality of Life Scores:   Quality of Life - 05/20/22 1411       Quality of Life   Select Quality of Life      Quality of Life Scores   Health/Function Pre 14.17 %    Socioeconomic Pre 15.07 %    Psych/Spiritual Pre 17.36 %    Family Pre 18.3 %    GLOBAL Pre 15.62 %            Scores of 19 and below usually indicate a poorer quality of life in these areas.  A difference of  2-3 points is a clinically meaningful difference.  A difference of 2-3 points in the total score of the Quality of Life Index has been associated with significant improvement in overall quality of life, self-image, physical symptoms, and general health in studies assessing change in quality of life.  PHQ-9: Review Flowsheet       05/20/2022 03/14/2022  Depression screen PHQ 2/9  Decreased Interest 0 0  Down, Depressed, Hopeless 0 0  PHQ - 2 Score 0 0  Altered sleeping 0 -  Tired, decreased energy 2 -  Change in  appetite 0 -  Feeling bad or failure about yourself  0 -  Trouble concentrating 0 -  Moving slowly or fidgety/restless 0 -  Suicidal thoughts 0 -  PHQ-9 Score 2 -  Difficult doing work/chores Not difficult at all -   Interpretation of Total Score  Total Score Depression Severity:  1-4 = Minimal depression, 5-9 = Mild depression, 10-14 = Moderate depression, 15-19 = Moderately severe depression, 20-27 = Severe depression   Psychosocial  Evaluation and Intervention:  Psychosocial Evaluation - 05/13/22 1412       Psychosocial Evaluation & Interventions   Interventions Encouraged to exercise with the program and follow exercise prescription    Comments Thaddeus 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 He is wearing a nicotine patch on and off and is trying to quit completly. He states he doesn't have really any stress concerns besides when he is in pain it makes things more difficult. He does report sleep concerns, that it is hard to stay asleep. He also needs a sleep study to evaluate for sleep apnea. He really enjoys playing pool and is back to playing since his CABG. He wants to work on exercising more and understanding how to live a heart healthy lifestyle.    Expected Outcomes Short: attend cardiac rehab for education and exercise. Long: develop and maintain positive self care habits.    Continue Psychosocial Services  Follow up required by staff             Psychosocial Re-Evaluation:  Psychosocial Re-Evaluation     Row Name 06/26/22 1546 07/29/22 1156           Psychosocial Re-Evaluation   Current issues with Current Stress Concerns;Current Sleep Concerns Current Stress Concerns;Current Sleep Concerns      Comments Logan Crawford is doing well in rehab.  He is working on getting set up for a second sleep study on 5/14 as his CPAP is not working well for him.  He is only able to sleep for around 2 hours before waking routines.  He has  also noted some swelling in his stomach and gained 20lb in one week.  He is also working with the doctor on this.  He is still working on quitting smoking and says stress is a contributing factor to not being able to drop last two.  We talked about trying to replace those with something else to help.  He was also encouraged to exercise more to help boost sleep and mood. Logan Crawford has been out sick for a couple of weeks.  He has not been working and still waiting to hear back about his sleep study.  He is also set up to meet with pulmonology soon.  He continues to work on quitting smoking and we talked abotu trying substititue again.      Expected Outcomes Short: Work with docotrs on sleep study and stomach Long: Add in more exercise mental boost Short: Hear back about sleep study Long: Continue to exercise for mental boost      Interventions Stress management education;Encouraged to attend Cardiac Rehabilitation for the exercise --      Continue Psychosocial Services  Follow up required by staff --               Psychosocial Discharge (Final Psychosocial Re-Evaluation):  Psychosocial Re-Evaluation - 07/29/22 1156       Psychosocial Re-Evaluation   Current issues with Current Stress Concerns;Current Sleep Concerns    Comments Logan Crawford has been out sick for a couple of weeks.  He has not been working and still waiting to hear back about his sleep study.  He is also set up to meet with pulmonology soon.  He continues to work on quitting smoking and we talked abotu trying substititue again.    Expected Outcomes Short: Hear back about sleep study Long: Continue to exercise for mental boost  Vocational Rehabilitation: Provide vocational rehab assistance to qualifying candidates.   Vocational Rehab Evaluation & Intervention:  Vocational Rehab - 05/13/22 1408       Initial Vocational Rehab Evaluation & Intervention   Assessment shows need for Vocational Rehabilitation No              Education: Education Goals: Education classes will be provided on a variety of topics geared toward better understanding of heart health and risk factor modification. Participant will state understanding/return demonstration of topics presented as noted by education test scores.  Learning Barriers/Preferences:  Learning Barriers/Preferences - 05/13/22 1408       Learning Barriers/Preferences   Learning Barriers None    Learning Preferences None             General Cardiac Education Topics:  AED/CPR: - Group verbal and written instruction with the use of models to demonstrate the basic use of the AED with the basic ABC's of resuscitation.   Anatomy and Cardiac Procedures: - Group verbal and visual presentation and models provide information about basic cardiac anatomy and function. Reviews the testing methods done to diagnose heart disease and the outcomes of the test results. Describes the treatment choices: Medical Management, Angioplasty, or Coronary Bypass Surgery for treating various heart conditions including Myocardial Infarction, Angina, Valve Disease, and Cardiac Arrhythmias.  Written material given at graduation. Flowsheet Row Cardiac Rehab from 05/20/2022 in [facility] Cardiac and Pulmonary Rehab  Education need identified 05/20/22       Medication Safety: - Group verbal and visual instruction to review commonly prescribed medications for heart and lung disease. Reviews the medication, class of the drug, and side effects. Includes the steps to properly store meds and maintain the prescription regimen.  Written material given at graduation.   Intimacy: - Group verbal instruction through game format to discuss how heart and lung disease can affect sexual intimacy. Written material given at graduation..   Know Your Numbers and Heart Failure: - Group verbal and visual instruction to discuss disease risk factors for cardiac and pulmonary disease and treatment options.   Reviews associated critical values for Overweight/Obesity, Hypertension, Cholesterol, and Diabetes.  Discusses basics of heart failure: signs/symptoms and treatments.  Introduces Heart Failure Zone chart for action plan for heart failure.  Written material given at graduation.   Infection Prevention: - Provides verbal and written material to individual with discussion of infection control including proper hand washing and proper equipment cleaning during exercise session. Flowsheet Row Cardiac Rehab from 05/20/2022 in [facility] Cardiac and Pulmonary Rehab  Date 05/20/22  Educator NT  Instruction Review Code 1- Verbalizes Understanding       Falls Prevention: - Provides verbal and written material to individual with discussion of falls prevention and safety. Flowsheet Row Cardiac Rehab from 05/20/2022 in [facility] Cardiac and Pulmonary Rehab  Date 05/20/22  Educator NT  Instruction Review Code 1- Verbalizes Understanding       Other: -Provides group and verbal instruction on various topics (see comments)   Knowledge Questionnaire Score:  Knowledge Questionnaire Score - 05/20/22 1411       Knowledge Questionnaire Score   Pre Score 19/26             Core Components/Risk Factors/Patient Goals at Admission:  Personal Goals and Risk Factors at Admission - 05/13/22 1406       Core Components/Risk Factors/Patient Goals on Admission   Tobacco Cessation Yes   wearing patch   Number of packs per day 0  Expected Outcomes Long Term: Complete abstinence from all tobacco products for at least 12 months from quit date.    Hypertension Yes    Intervention Provide education on lifestyle modifcations including regular physical activity/exercise, weight management, moderate sodium restriction and increased consumption of fresh fruit, vegetables, and low fat dairy, alcohol moderation, and smoking cessation.;Monitor prescription use compliance.    Expected Outcomes Short Term: Continued assessment  and intervention until BP is < 140/32mm HG in hypertensive participants. < 130/98mm HG in hypertensive participants with diabetes, heart failure or chronic kidney disease.;Long Term: Maintenance of blood pressure at goal levels.             Education:Diabetes - Individual verbal and written instruction to review signs/symptoms of diabetes, desired ranges of glucose level fasting, after meals and with exercise. Acknowledge that pre and post exercise glucose checks will be done for 3 sessions at entry of program.   Core Components/Risk Factors/Patient Goals Review:   Goals and Risk Factor Review     Row Name 06/26/22 1552 07/29/22 1159           Core Components/Risk Factors/Patient Goals Review   Personal Goals Review Weight Management/Obesity;Tobacco Cessation;Hypertension Weight Management/Obesity;Tobacco Cessation;Hypertension      Review Logan Crawford is doing well in rehab.  He is working on weigh loss.  His weight went up 20 lb in one week and he went to doctor and they did xrays and red vest and could not find fluid.  He is still working with them and on lasix to help.  His pressures are doing well, but slightly elevated and could be related to his meals out. He is also still smoking, but down to 2-3 cigs a day.  When we talked about when they are, they are his usually after meal cigarettes.  He had used patches before and the nicotine makes him want to smoke more.  Thus, we talked about replacing his after meal cigarette with a different activity like chewing a straw or gum to help kick the habit.  He was open to trying that out. Logan Crawford returned to rehab today after being out sick.  His weight is still trending up with eating out and quitting smoking.  He is still at 2-3 cigarettes a day.  We talked about finding a substittute for those to put in his mouth.  His pressures are doing well but were up a little with being sick.  His biggest issue is his breathing still and he is set to meet with  pulmolnologist.      Expected Outcomes Short: Try out gum or straw for after meals Long: conitnue to work on cessation and weight loss. Short: Continue to work on cessation Long: continue to work on [employer] loss               Core Components/Risk Factors/Patient Goals at Discharge (Final Review):   Goals and Risk Factor Review - 07/29/22 1159       Core Components/Risk Factors/Patient Goals Review   Personal Goals Review Weight Management/Obesity;Tobacco Cessation;Hypertension    Review Logan Crawford returned to rehab today after being out sick.  His weight is still trending up with eating out and quitting smoking.  He is still at 2-3 cigarettes a day.  We talked about finding a substittute for those to put in his mouth.  His pressures are doing well but were up a little with being sick.  His biggest issue is his breathing still and he is set to meet with pulmolnologist.  Expected Outcomes Short: Continue to work on cessation Long: continue to work on weight loss             ITP Comments:  ITP Comments     Row Name 05/13/22 1416 05/20/22 1402 05/27/22 1115 06/11/22 1156 07/09/22 0843   ITP Comments Initial phone call completed. Diagnosis can be found in [facility] 12/25. EP Orientation scheduled for Tuesday 3/26 at 9:30.  Nasiir has recently quit tobacco use within the last 6 months. Intervention for relapse prevention was provided at the initial medical review. He was encouraged to continue to with tobacco cessation and was provided information on relapse prevention. Patient received information about combination therapy, tobacco cessation classes, quit line, and quit smoking apps in case of a relapse. Patient demonstrated understanding of this material.Staff will continue to provide encouragement and follow up with the patient throughout the program. Completed and gym orientation. Initial ITP created and sent for review to Dr. Bethann Punches, Medical Director. First full day of exercise!  Patient  was oriented to gym and equipment including functions, settings, policies, and procedures.  Patient's individual exercise prescription and treatment plan were reviewed.  All starting workloads were established based on the results of the 6 minute walk test done at initial orientation visit.  The plan for exercise progression was also introduced and progression will be customized based on patient's performance and goals. 30 day review completed. ITP sent to Dr. Bethann Punches, Medical Director of Cardiac Rehab. Continue with ITP unless changes are made by physician.   Pt still new to program 30 Day review completed. Medical Director ITP review done, changes made as directed, and signed approval by Medical Director.    Row Name 08/06/22 0935 09/02/22 1430         ITP Comments 30 Day review completed. Medical Director ITP review done, changes made as directed, and signed approval by Medical Director. 30 Day review completed. Medical Director ITP review done, changes made as directed, and signed approval by Medical Director.               Comments:

## 2022-09-03 ENCOUNTER — Telehealth: Payer: Self-pay | Admitting: *Deleted

## 2022-09-03 ENCOUNTER — Encounter: Payer: Self-pay | Admitting: *Deleted

## 2022-09-03 NOTE — Telephone Encounter (Signed)
Called pt to follow up. He has not been to rehab since 6/6. Spoke with pt, he said he has been waiting to get his Bipap machine, he has not been sleeping well and has worsening fatigue. He was hoping once he started his Bipap at night he will feel better to restart rehab sessions. Pt will get Bipap next week on Tuesday 7/16. Pt plans to restart rehab that following Thursday 7/18. Pt states he will call if unable to make session.

## 2022-09-08 ENCOUNTER — Ambulatory Visit: Payer: Medicaid Other | Admitting: Gastroenterology

## 2022-09-11 ENCOUNTER — Encounter: Payer: Medicaid Other | Admitting: *Deleted

## 2022-09-11 ENCOUNTER — Encounter: Payer: Self-pay | Admitting: *Deleted

## 2022-09-11 NOTE — Telephone Encounter (Signed)
Pt was a no show today for rehab. Pt states he received his Bipap yesterday and overslept today for rehab. Pt states he will plan to be at rehab next week 7/23. Advised pt to call if unable to attend. Pt voiced understanding.

## 2022-09-15 ENCOUNTER — Ambulatory Visit: Payer: Medicaid Other | Admitting: Student in an Organized Health Care Education/Training Program

## 2022-09-15 ENCOUNTER — Other Ambulatory Visit: Payer: Self-pay

## 2022-09-16 ENCOUNTER — Ambulatory Visit: Payer: Medicaid Other | Admitting: Student in an Organized Health Care Education/Training Program

## 2022-09-18 ENCOUNTER — Encounter: Payer: Self-pay | Admitting: *Deleted

## 2022-09-18 ENCOUNTER — Encounter: Payer: Medicaid Other | Admitting: *Deleted

## 2022-09-18 NOTE — Telephone Encounter (Signed)
Attempted to call pt. No answer. Unable to leave vm. Pt has not been to rehab since 6/6. Letter mailed to pt to notify of discharge date set for 10/02/22.

## 2022-09-29 ENCOUNTER — Other Ambulatory Visit (HOSPITAL_COMMUNITY): Payer: Self-pay

## 2022-09-29 ENCOUNTER — Telehealth: Payer: Self-pay

## 2022-09-29 NOTE — Telephone Encounter (Signed)
*  Pulm  Pharmacy Patient Advocate Encounter   Received notification from Fax that prior authorization for Trelegy Ellipta 100-62.5-25MCG/ACT aerosol powder  is required/requested.   Insurance verification completed.   The patient is insured through [facility] .   Per test claim: PA required; PA submitted to [facility] via CoverMyMeds Key/confirmation #/[id number] [facility] Status is pending

## 2022-09-29 NOTE — Telephone Encounter (Signed)
Approved. This drug has been approved. Approved quantity: 60 units per 30 day(s). The drug has been approved from 09/15/2022 to 09/29/2023. Please call the pharmacy to process your prescription claim. Generic or biosimilar substitution may be required when available and preferred on the formulary.

## 2022-09-30 ENCOUNTER — Other Ambulatory Visit: Payer: Self-pay | Admitting: Nurse Practitioner

## 2022-09-30 DIAGNOSIS — K219 Gastro-esophageal reflux disease without esophagitis: Secondary | ICD-10-CM

## 2022-10-01 ENCOUNTER — Encounter: Payer: Self-pay | Admitting: *Deleted

## 2022-10-01 DIAGNOSIS — Z951 Presence of aortocoronary bypass graft: Secondary | ICD-10-CM

## 2022-10-01 NOTE — Progress Notes (Signed)
Cardiac Individual Treatment Plan  Patient Details  Name: Logan Crawford MRN: [id number] Date of Birth: May 29, 1975 Referring Provider:   Flowsheet Row Cardiac Rehab from 05/20/2022 in [facility] Cardiac and Pulmonary Rehab  Referring Provider Dr. Debbe Odea, MD       Initial Encounter Date:  Flowsheet Row Cardiac Rehab from 05/20/2022 in [facility] Cardiac and Pulmonary Rehab  Date 05/20/22       Visit Diagnosis: S/P CABG x 3  Patient's Home Medications on Admission:  Current Outpatient Medications:    aspirin EC 81 MG tablet, Take 1 tablet (81 mg total) by mouth daily. Swallow whole., Disp: 90 tablet, Rfl: 3   clopidogrel (PLAVIX) 75 MG tablet, Take 1 tablet (75 mg total) by mouth daily., Disp: 90 tablet, Rfl: 3   empagliflozin (JARDIANCE) 10 MG TABS tablet, Take 10 mg by mouth daily., Disp: , Rfl:    famotidine (PEPCID) 20 MG tablet, Take 1 tablet by mouth twice daily, Disp: 60 tablet, Rfl: 0   Fluticasone-Umeclidin-Vilant (TRELEGY ELLIPTA) 100-62.5-25 MCG/ACT AEPB, Inhale 1 puff into the lungs daily., Disp: 28 each, Rfl: 0   furosemide (LASIX) 40 MG tablet, Take 1 tablet (40 mg total) by mouth daily., Disp: 90 tablet, Rfl: 3   gabapentin (NEURONTIN) 100 MG capsule, Take 1 capsule (100 mg total) by mouth 2 (two) times daily., Disp: 60 capsule, Rfl: 2   losartan (COZAAR) 50 MG tablet, Take 1 tablet (50 mg total) by mouth daily., Disp: 90 tablet, Rfl: 3   metoprolol succinate (TOPROL-XL) 25 MG 24 hr tablet, Take 2 tablets (50 mg total) by mouth daily. Stop taking metoprolol if you continue to use cocaine, Disp: 180 tablet, Rfl: 3   nicotine (NICODERM CQ - DOSED IN MG/24 HOURS) 21 mg/24hr patch, Place 1 patch (21 mg total) onto the skin daily., Disp: 28 patch, Rfl: 0   nitroGLYCERIN (NITROSTAT) 0.4 MG SL tablet, Place 1 tablet (0.4 mg total) under the tongue every 5 (five) minutes as needed for chest pain (if chest pain not resolved after 3 doses, please call 911)., Disp: 25 tablet, Rfl:  2   pantoprazole (PROTONIX) 40 MG tablet, Take 1 tablet (40 mg total) by mouth 2 (two) times daily., Disp: 60 tablet, Rfl: 1   rosuvastatin (CRESTOR) 40 MG tablet, Take 1 tablet (40 mg total) by mouth daily., Disp: 90 tablet, Rfl: 3   spironolactone (ALDACTONE) 25 MG tablet, Take 1 tablet (25 mg total) by mouth daily. (Patient not taking: Reported on 08/20/2022), Disp: 90 tablet, Rfl: 3   VENTOLIN HFA 108 (90 Base) MCG/ACT inhaler, SMARTSIG:1 Via Inhaler Daily, Disp: , Rfl:   Current Facility-Administered Medications:    0.9 %  sodium chloride infusion, 500 mL, Intravenous, Continuous, Cirigliano, Vito V, DO  Past Medical History: Past Medical History:  Diagnosis Date   CAD (coronary artery disease)    a. 11/2019 NSTEMI/PCI: LM min irregs, LAD 52m (2.75x22 Resolute Onyx DES), 40d, D2 min irregs, LCX/LPAV min irregs, RCA 60ost, 40p/m, EF 45-50%; b. 11/2020 MV: EF 43%, small area of apical ischemia ->? over processing-->low risk.   Hyperlipidemia    Hypertension    Ischemic cardiomyopathy    a. 11/2019 LV Gram: EF 45-50%; b. 12/2019 Echo: EF 50% w/ mod mid-apical anteroseptal HK, GrI DD, nl RV fxn, mild Ao sclerosis.   Tobacco use 11/25/2019    Tobacco Use: Social History   Tobacco Use  Smoking Status Every Day   Current packs/day: 1.00   Average packs/day: 1 pack/day for 10.0  years (10.0 ttl pk-yrs)   Types: Cigarettes   Passive exposure: Past  Smokeless Tobacco Never  Tobacco Comments   2-3 cigarettes daily- 08/06/2022    Labs: Review Flowsheet  More data exists      Latest Ref Rng & Units 02/21/2022 02/22/2022 02/23/2022 04/14/2022 05/02/2022  Labs for ITP Cardiac and Pulmonary Rehab  Cholestrol 0 - 200 mg/dL - - - - 324   LDL (calc) 0 - 99 mg/dL - - - - 46   HDL-C >40 mg/dL - - - - 26   Trlycerides <150 mg/dL - - - - 102   Hemoglobin A1c 4.8 - 5.6 % - - - 5.2  -  O2 Saturation % 62.6  44.6  52.6  44.8  - -    Details       Multiple values from one day are sorted in  reverse-chronological order          Exercise Target Goals: Exercise Program Goal: Individual exercise prescription set using results from initial 6 min walk test and THRR while considering  patient's activity barriers and safety.   Exercise Prescription Goal: Initial exercise prescription builds to 30-45 minutes a day of aerobic activity, 2-3 days per week.  Home exercise guidelines will be given to patient during program as part of exercise prescription that the participant will acknowledge.   Education: Aerobic Exercise: - Group verbal and visual presentation on the components of exercise prescription. Introduces F.I.T.T principle from ACSM for exercise prescriptions.  Reviews F.I.T.T. principles of aerobic exercise including progression. Written material given at graduation. Flowsheet Row Cardiac Rehab from 05/20/2022 in [facility] Cardiac and Pulmonary Rehab  Education need identified 05/20/22       Education: Resistance Exercise: - Group verbal and visual presentation on the components of exercise prescription. Introduces F.I.T.T principle from ACSM for exercise prescriptions  Reviews F.I.T.T. principles of resistance exercise including progression. Written material given at graduation.    Education: Exercise & Equipment Safety: - Individual verbal instruction and demonstration of equipment use and safety with use of the equipment. Flowsheet Row Cardiac Rehab from 05/20/2022 in [facility] Cardiac and Pulmonary Rehab  Date 05/20/22  Educator NT  Instruction Review Code 1- Verbalizes Understanding       Education: Exercise Physiology & General Exercise Guidelines: - Group verbal and written instruction with models to review the exercise physiology of the cardiovascular system and associated critical values. Provides general exercise guidelines with specific guidelines to those with heart or lung disease.    Education: Flexibility, Balance, Mind/Body Relaxation: - Group verbal and  visual presentation with interactive activity on the components of exercise prescription. Introduces F.I.T.T principle from ACSM for exercise prescriptions. Reviews F.I.T.T. principles of flexibility and balance exercise training including progression. Also discusses the mind body connection.  Reviews various relaxation techniques to help reduce and manage stress (i.e. Deep breathing, progressive muscle relaxation, and visualization). Balance handout provided to take home. Written material given at graduation.   Activity Barriers & Risk Stratification:  Activity Barriers & Cardiac Risk Stratification - 05/20/22 1429       Activity Barriers & Cardiac Risk Stratification   Activity Barriers Back Problems;Shortness of Breath   Occasional pain in knees, feet, and hands   Cardiac Risk Stratification High             6 Minute Walk:  6 Minute Walk     Row Name 05/20/22 1425         6 Minute Walk   Phase Initial  Distance 1215 feet     Walk Time 6 minutes     # of Rest Breaks 0     MPH 2.3     METS 3.93     RPE 10     Perceived Dyspnea  1     VO2 Peak 13.74     Symptoms No     Resting HR 95 bpm     Resting BP 124/66     Resting Oxygen Saturation  98 %     Exercise Oxygen Saturation  during 6 min walk 97 %     Max Ex. HR 111 bpm     Max Ex. BP 132/74     2 Minute Post BP 116/72              Oxygen Initial Assessment:   Oxygen Re-Evaluation:   Oxygen Discharge (Final Oxygen Re-Evaluation):   Initial Exercise Prescription:  Initial Exercise Prescription - 05/20/22 1400       Date of Initial Exercise RX and Referring Provider   Date 05/20/22    Referring Provider Dr. Debbe Odea, MD      Oxygen   Maintain Oxygen Saturation 88% or higher      Treadmill   MPH 2    Grade 1    Minutes 15    METs 2.81      NuStep   Level 3    SPM 80    Minutes 15    METs 3.93      Recumbant Elliptical   Level 1.5    RPM 50    Minutes 15    METs 3.93       Prescription Details   Frequency (times per week) 2    Duration Progress to 30 minutes of continuous aerobic without signs/symptoms of physical distress      Intensity   THRR 40-80% of Max Heartrate 125-156    Ratings of Perceived Exertion 11-13    Perceived Dyspnea 0-4      Progression   Progression Continue to progress workloads to maintain intensity without signs/symptoms of physical distress.      Resistance Training   Training Prescription Yes    Weight 4 lb    Reps 10-15             Perform Capillary Blood Glucose checks as needed.  Exercise Prescription Changes:   Exercise Prescription Changes     Row Name 05/20/22 1400 06/05/22 1400 06/17/22 1500 07/01/22 0800 07/15/22 1500     Response to Exercise   Blood Pressure (Admit) 124/66 112/66 108/60 146/74 132/70   Blood Pressure (Exercise) 132/74 144/78 116/68 126/74 152/74   Blood Pressure (Exit) 116/72 98/56 106/62 120/70 124/70   Heart Rate (Admit) 95 bpm 93 bpm 92 bpm 93 bpm 88 bpm   Heart Rate (Exercise) 111 bpm 113 bpm 110 bpm 110 bpm 124 bpm   Heart Rate (Exit) 98 bpm 97 bpm 87 bpm 94 bpm 105 bpm   Oxygen Saturation (Admit) 98 % -- -- -- --   Oxygen Saturation (Exercise) 97 % -- -- -- --   Rating of Perceived Exertion (Exercise) 10 13 15 13 17    Perceived Dyspnea (Exercise) 1 -- -- -- --   Symptoms None none none none none   Comments Results First two days of exercise -- -- --   Duration -- Continue with 30 min of aerobic exercise without signs/symptoms of physical distress. Continue with 30 min of aerobic exercise without signs/symptoms of  physical distress. Continue with 30 min of aerobic exercise without signs/symptoms of physical distress. Continue with 30 min of aerobic exercise without signs/symptoms of physical distress.   Intensity -- THRR unchanged THRR unchanged THRR unchanged THRR unchanged     Progression   Progression -- Continue to progress workloads to maintain intensity without  signs/symptoms of physical distress. Continue to progress workloads to maintain intensity without signs/symptoms of physical distress. Continue to progress workloads to maintain intensity without signs/symptoms of physical distress. Continue to progress workloads to maintain intensity without signs/symptoms of physical distress.   Average METs -- 2.79 2.91 3.23 3.53     Resistance Training   Training Prescription -- Yes Yes Yes Yes   Weight -- 4 lb 4 lb 4 lb 4 lb   Reps -- 10-15 10-15 10-15 10-15     Interval Training   Interval Training -- No No No Yes   Equipment -- -- -- -- REL-XR   Comments -- -- -- -- X mode -intervals up to level 13     Treadmill   MPH -- 2 2.1 2.2 3   Grade -- 1 2 2 2    Minutes -- 15 15 15 15    METs -- 2.81 3.19 3.29 4.12     NuStep   Level -- 3 7 -- 5   Minutes -- 15 15 -- 15   METs -- 2.7 3.1 -- 3.1     Recumbant Elliptical   Level -- -- 3 -- 3   Minutes -- -- 15 -- 15   METs -- -- 2.1 -- 2.4     Elliptical   Level -- -- -- -- 1  5 min   Minutes -- -- -- -- 15     REL-XR   Level -- -- -- 7 13  intervals   Minutes -- -- -- 15 15   METs -- -- -- 3.2 --     Oxygen   Maintain Oxygen Saturation -- 88% or higher 88% or higher 88% or higher 88% or higher    Row Name 08/14/22 0800             Response to Exercise   Blood Pressure (Admit) 118/76       Blood Pressure (Exit) 114/76       Heart Rate (Admit) 97 bpm       Heart Rate (Exercise) 110 bpm       Heart Rate (Exit) 69 bpm       Rating of Perceived Exertion (Exercise) 15       Symptoms none       Duration Continue with 30 min of aerobic exercise without signs/symptoms of physical distress.       Intensity THRR unchanged         Progression   Progression Continue to progress workloads to maintain intensity without signs/symptoms of physical distress.       Average METs 2.94         Resistance Training   Training Prescription Yes       Weight 4 lb       Reps 10-15         Interval  Training   Interval Training No         Treadmill   MPH 2.6       Grade 1       Minutes 15       METs 3.35         Recumbant Elliptical  Level 3       Minutes 15       METs 2.2         REL-XR   Level 1       Minutes 15         Oxygen   Maintain Oxygen Saturation 88% or higher                Exercise Comments:   Exercise Comments     Row Name 05/27/22 1115           Exercise Comments First full day of exercise!  Patient was oriented to gym and equipment including functions, settings, policies, and procedures.  Patient's individual exercise prescription and treatment plan were reviewed.  All starting workloads were established based on the results of the 6 minute walk test done at initial orientation visit.  The plan for exercise progression was also introduced and progression will be customized based on patient's performance and goals.                Exercise Goals and Review:   Exercise Goals     Row Name 05/20/22 1430             Exercise Goals   Increase Physical Activity Yes       Intervention Provide advice, education, support and counseling about physical activity/exercise needs.;Develop an individualized exercise prescription for aerobic and resistive training based on initial evaluation findings, risk stratification, comorbidities and participant's personal goals.       Expected Outcomes Short Term: Attend rehab on a regular basis to increase amount of physical activity.;Long Term: Exercising regularly at least 3-5 days a week.;Long Term: Add in home exercise to make exercise part of routine and to increase amount of physical activity.       Increase Strength and Stamina Yes       Intervention Provide advice, education, support and counseling about physical activity/exercise needs.;Develop an individualized exercise prescription for aerobic and resistive training based on initial evaluation findings, risk stratification, comorbidities and  participant's personal goals.       Expected Outcomes Short Term: Increase workloads from initial exercise prescription for resistance, speed, and METs.;Short Term: Perform resistance training exercises routinely during rehab and add in resistance training at home;Long Term: Improve cardiorespiratory fitness, muscular endurance and strength as measured by increased METs and functional capacity ( )       Able to understand and use rate of perceived exertion (RPE) scale Yes       Intervention Provide education and explanation on how to use RPE scale       Expected Outcomes Short Term: Able to use RPE daily in rehab to express subjective intensity level;Long Term:  Able to use RPE to guide intensity level when exercising independently       Able to understand and use Dyspnea scale Yes       Intervention Provide education and explanation on how to use Dyspnea scale       Expected Outcomes Short Term: Able to use Dyspnea scale daily in rehab to express subjective sense of shortness of breath during exertion;Long Term: Able to use Dyspnea scale to guide intensity level when exercising independently       Knowledge and understanding of Target Heart Rate Range (THRR) Yes       Intervention Provide education and explanation of THRR including how the numbers were predicted and where they are located for reference       Expected Outcomes  Short Term: Able to state/look up THRR;Long Term: Able to use THRR to govern intensity when exercising independently;Short Term: Able to use daily as guideline for intensity in rehab       Able to check pulse independently Yes       Intervention Provide education and demonstration on how to check pulse in carotid and radial arteries.;Review the importance of being able to check your own pulse for safety during independent exercise       Expected Outcomes Short Term: Able to explain why pulse checking is important during independent exercise;Long Term: Able to check pulse  independently and accurately       Understanding of Exercise Prescription Yes       Intervention Provide education, explanation, and written materials on patient's individual exercise prescription       Expected Outcomes Short Term: Able to explain program exercise prescription;Long Term: Able to explain home exercise prescription to exercise independently                Exercise Goals Re-Evaluation :  Exercise Goals Re-Evaluation     Row Name 05/27/22 1115 06/05/22 1410 06/17/22 1542 06/26/22 1544 07/01/22 0848     Exercise Goal Re-Evaluation   Exercise Goals Review Increase Physical Activity;Able to understand and use rate of perceived exertion (RPE) scale;Knowledge and understanding of Target Heart Rate Range (THRR);Understanding of Exercise Prescription;Increase Strength and Stamina;Able to check pulse independently Increase Physical Activity;Increase Strength and Stamina;Understanding of Exercise Prescription Increase Physical Activity;Increase Strength and Stamina;Understanding of Exercise Prescription Increase Physical Activity;Increase Strength and Stamina;Understanding of Exercise Prescription Increase Physical Activity;Increase Strength and Stamina;Understanding of Exercise Prescription   Comments Reviewed RPE scale, THR and program prescription with pt today.  Pt voiced understanding and was given a copy of goals to take home. Logan Crawford is off to a good start in rehab. He did well with the treadmill at a speed of 2 mph with an incline of 1%. He also worked at level 3 on the T4 nustep during his first two sessions. He has done well with 4 lb hand weights for resistance training as well. We will continue to monitor his progress in the program. Logan Crawford continues to do well in rehab. He increased to level 7 on the T4 Nustep and up to level 3 on the REL. He increased his incline to 4% on the treadmill for 1 session. He is not quite hitting his THR  but RPEs are staying appropriate. We will continue to  monitor. Logan Crawford is off to a good start in rehab.  He feels that he is struggling to get here twice a week, but admits that it does get him up and out of bed.  We offered a later class time, but he said that he was goo.  He is not doing much outside of rehab yet, but plans to rejoin [employer] to start working out more. We will review home exercise with him soon.  He is a fan of the sauna at [facility]! Logan Crawford has only attended rehab twice since the last review. He increased his overall average MET level to 3.23 METs. He increased his speed on the treadmill to 2.2 mph while maintaining an incline of 2% as well.  He also improved to level 7 on the XR. We will continue to monitor his progress in the program.   Expected Outcomes Short: Use RPE daily to regulate intensity.  Long: Follow program prescription in THR. Short: Continue to follow current exercise prescription and progressively increase  workloads. Long: Continue to improve strength and stamina. Short: Slowly progress incline back up beyond baseline of 2% Long: Continue to increase overall MET level and stamina Short: Review home exercise guidelines Long: Add in more exercise on off days Short: Continue to progressively increase treadmill workload. Long: Continue to improve strength and stamina.    Row Name 07/15/22 1515 07/29/22 1154 07/31/22 1501 08/14/22 0854 08/27/22 1538     Exercise Goal Re-Evaluation   Exercise Goals Review Increase Physical Activity;Increase Strength and Stamina;Understanding of Exercise Prescription Increase Physical Activity;Increase Strength and Stamina;Understanding of Exercise Prescription Increase Physical Activity;Increase Strength and Stamina;Understanding of Exercise Prescription Increase Physical Activity;Increase Strength and Stamina;Understanding of Exercise Prescription Increase Physical Activity;Increase Strength and Stamina;Understanding of Exercise Prescription   Comments Logan Crawford continues to do well in rehab. He tried out  the elliptical for the first time and was able to complete 5 minutes. He started doing intervals up to level 13 on the XR.  He also increased to a 3.0 mph speed on the treadmill! We will continue to monitor. Logan Crawford has been out sick for the last two weeks.  His breathing has also gotten worse and he is scheduled to meet with a pulmonologist soon for a work up.  He has not been exercising with feeling sick, but he has been stretching more! Logan Crawford has not attended rehab since the last review due to being out sick. We will continue to monitor his progress once he returns to regular attendance in the program. Logan Crawford has only attended two rehab sessions since the last review after returning from being sick. His treadmill workload has decreased both the speed down to 2.6 mph and the incline down to 1%. He has continued to work at level 3 on the recumbent elliptical. He also has continued to use 4 lb hand weights for resistance training. We will continue to monitor his progress in the program. Logan Crawford has not attended rehab since the last review. We will call to check in on patient. We will continue to monitor his progress in the program.   Expected Outcomes Short: Continue to increase duration on elliptical slowly Long: Continue to increase overall MET level and stamina Short: Add exercise back into routine Long: Continue to improve stamina Short: Return to regular attendance in rehab. Long: Continue to improve strength and stamina. Short: Increase treadmill workload back up to previous speed and incline. Long: Continue to improve strength and stamina. Short: Return to regular attendance in the program. Long: Continue to increase strength and stamina.    Row Name 09/12/22 0844             Exercise Goal Re-Evaluation   Exercise Goals Review Increase Physical Activity;Increase Strength and Stamina;Understanding of Exercise Prescription       Comments Logan Crawford has not attended rehab since the last review. We called to check in  on patient and he stated he will plan to be at rehab next week 7/23. We will continue to monitor his progress in the program.       Expected Outcomes Short: Return to regular attendance in the program. Long: Continue exercise to increase strength and stamina.                Discharge Exercise Prescription (Final Exercise Prescription Changes):  Exercise Prescription Changes - 08/14/22 0800       Response to Exercise   Blood Pressure (Admit) 118/76    Blood Pressure (Exit) 114/76    Heart Rate (Admit) 97 bpm  Heart Rate (Exercise) 110 bpm    Heart Rate (Exit) 69 bpm    Rating of Perceived Exertion (Exercise) 15    Symptoms none    Duration Continue with 30 min of aerobic exercise without signs/symptoms of physical distress.    Intensity THRR unchanged      Progression   Progression Continue to progress workloads to maintain intensity without signs/symptoms of physical distress.    Average METs 2.94      Resistance Training   Training Prescription Yes    Weight 4 lb    Reps 10-15      Interval Training   Interval Training No      Treadmill   MPH 2.6    Grade 1    Minutes 15    METs 3.35      Recumbant Elliptical   Level 3    Minutes 15    METs 2.2      REL-XR   Level 1    Minutes 15      Oxygen   Maintain Oxygen Saturation 88% or higher             Nutrition:  Target Goals: Understanding of nutrition guidelines, daily intake of sodium 1500mg , cholesterol 200mg , calories 30% from fat and 7% or less from saturated fats, daily to have 5 or more servings of fruits and vegetables.  Education: All About Nutrition: -Group instruction provided by verbal, written material, interactive activities, discussions, models, and posters to present general guidelines for heart healthy nutrition including fat, fiber, MyPlate, the role of sodium in heart healthy nutrition, utilization of the nutrition label, and utilization of this knowledge for meal planning. Follow up  email sent as well. Written material given at graduation. Flowsheet Row Cardiac Rehab from 05/20/2022 in [facility] Cardiac and Pulmonary Rehab  Education need identified 05/20/22       Biometrics:  Pre Biometrics - 05/20/22 1432       Pre Biometrics   Height 6' (1.829 m)    Weight 226 lb 12.8 oz (102.9 kg)    Waist Circumference 40.5 inches    Hip Circumference 43 inches    Waist to Hip Ratio 0.94 %    BMI (Calculated) 30.75    Single Leg Stand 30 seconds              Nutrition Therapy Plan and Nutrition Goals:  Nutrition Therapy & Goals - 05/20/22 1131       Nutrition Therapy   Diet Heart healthy, low Na    Drug/Food Interactions Statins/Certain Fruits    Protein (specify units) 65g   CKD stg 3: 0.6g/kg/d   Fiber 30 grams    Whole Grain Foods 3 servings    Saturated Fats 16 max. grams    Fruits and Vegetables 8 servings/day    Sodium 2 grams      Personal Nutrition Goals   Nutrition Goal ST: review handouts, focus on lean meats, limit creamy or cheese sauces or add ons, and add fruit and vegetable servings to meals when going out LT: limit Na <2g, limit saturated fat <16g/day, aim for at least 30g of fiber/day    Comments 47 y.o. M admitted to cardiac rehab s/p CABG x 3. PMHx includes HTN, NSTEMI (2021 and 2023), CAD, chronic diastolic CHF, GERD, Stg 3a CKD, HLD, polysubstance abuse, former tobacco use, hypercalcemia. Medications reviewed symbicort, jardiance, pepcid, furosemide, nicotine, pantoprazole, rosuvastatin. B: sausage egg and cheese on toast or raisin bran L: chik-fil-a or grilled chicken  or ribeye steak sandwich - he has been trying to limit [employer] burger D: [place] food 2x/week, sushi, salmon S; chips or candy bars or pickles Drinks: water, gatorade (grape - regular) 2-3x/day and sweet tea 3-4x/day. He reports not cooking at home most of the time and would like to focus on modifications eating out as he does not want to cook - discussed some options to put  together at home too like rotisserie chicken, microwave grains like brown rice, and frozen vegetables. Also discussed general heart healthy eating and considerations while eating out.      Intervention Plan   Intervention Prescribe, educate and counsel regarding individualized specific dietary modifications aiming towards targeted core components such as weight, hypertension, lipid management, diabetes, heart failure and other comorbidities.;Nutrition handout(s) given to patient.    Expected Outcomes Short Term Goal: Understand basic principles of dietary content, such as calories, fat, sodium, cholesterol and nutrients.;Short Term Goal: A plan has been developed with personal nutrition goals set during dietitian appointment.;Long Term Goal: Adherence to prescribed nutrition plan.             Nutrition Assessments:  MEDIFICTS Score Key: ?70 Need to make dietary changes  40-70 Heart Healthy Diet ? 40 Therapeutic Level Cholesterol Diet  Flowsheet Row Cardiac Rehab from 05/20/2022 in [facility] Cardiac and Pulmonary Rehab  Picture Your Plate Total Score on Admission 50      Picture Your Plate Scores: <16 Unhealthy dietary pattern with much room for improvement. 41-50 Dietary pattern unlikely to meet recommendations for good health and room for improvement. 51-60 More healthful dietary pattern, with some room for improvement.  >60 Healthy dietary pattern, although there may be some specific behaviors that could be improved.    Nutrition Goals Re-Evaluation:  Nutrition Goals Re-Evaluation     Row Name 06/26/22 1549 07/29/22 1158           Goals   Nutrition Goal ST: review handouts, focus on lean meats, limit creamy or cheese sauces or add ons, and add fruit and vegetable servings to meals when going out LT: limit Na <2g, limit saturated fat <16g/day, aim for at least 30g of fiber/day Short: Try premade meal kits at [employer] Long: Continue to reduce meals out      Comment Logan Crawford was able to  meet with dietitian and they talked about picking healthier choices when going out to eat.  Logan Crawford does not cook at home and finds it difficult.  Hei is still eating a lot of sodium from mulitple meals out.  He is trying to make better choices.  Trying to work on his cooking, we talked about trying pre-prepped meals that are easy to heat up (Not TV dinner, but store made kits).  He was open to trying meal kits at [employer]. Logan Crawford is doing well in rehab. He has not had the premade meal kits and still eating out.  He has gotten better about what he eats, but his weight is up and still eating out more so than not.  We also talked about how when he eats out the portions are bigger than what he would normally make/eat at home.      Expected Outcome Short: Try premade meal kits at [employer] Long: Continue to reduce meals out Short: Cut back on eating out Long: Continue to make healthier choices               Nutrition Goals Discharge (Final Nutrition Goals Re-Evaluation):  Nutrition Goals Re-Evaluation - 07/29/22 1158  Goals   Nutrition Goal Short: Try premade meal kits at [employer] Long: Continue to reduce meals out    Comment Logan Crawford is doing well in rehab. He has not had the premade meal kits and still eating out.  He has gotten better about what he eats, but his weight is up and still eating out more so than not.  We also talked about how when he eats out the portions are bigger than what he would normally make/eat at home.    Expected Outcome Short: Cut back on eating out Long: Continue to make healthier choices             Psychosocial: Target Goals: Acknowledge presence or absence of significant depression and/or stress, maximize coping skills, provide positive support system. Participant is able to verbalize types and ability to use techniques and skills needed for reducing stress and depression.   Education: Stress, Anxiety, and Depression - Group verbal and visual presentation to define topics  covered.  Reviews how body is impacted by stress, anxiety, and depression.  Also discusses healthy ways to reduce stress and to treat/manage anxiety and depression.  Written material given at graduation.   Education: Sleep Hygiene -Provides group verbal and written instruction about how sleep can affect your health.  Define sleep hygiene, discuss sleep cycles and impact of sleep habits. Review good sleep hygiene tips.    Initial Review & Psychosocial Screening:  Initial Psych Review & Screening - 05/13/22 1408       Initial Review   Current issues with Current Sleep Concerns      Family Dynamics   Good Support System? Yes      Barriers   Psychosocial barriers to participate in program There are no identifiable barriers or psychosocial needs.;The patient should benefit from training in stress management and relaxation.      Screening Interventions   Interventions Encouraged to exercise;Provide feedback about the scores to participant;To provide support and resources with identified psychosocial needs    Expected Outcomes Short Term goal: Utilizing psychosocial counselor, staff and physician to assist with identification of specific Stressors or current issues interfering with healing process. Setting desired goal for each stressor or current issue identified.;Long Term Goal: Stressors or current issues are controlled or eliminated.;Short Term goal: Identification and review with participant of any Quality of Life or Depression concerns found by scoring the questionnaire.;Long Term goal: The participant improves quality of Life and PHQ9 Scores as seen by post scores and/or verbalization of changes             Quality of Life Scores:   Quality of Life - 05/20/22 1411       Quality of Life   Select Quality of Life      Quality of Life Scores   Health/Function Pre 14.17 %    Socioeconomic Pre 15.07 %    Psych/Spiritual Pre 17.36 %    Family Pre 18.3 %    GLOBAL Pre 15.62 %             Scores of 19 and below usually indicate a poorer quality of life in these areas.  A difference of  2-3 points is a clinically meaningful difference.  A difference of 2-3 points in the total score of the Quality of Life Index has been associated with significant improvement in overall quality of life, self-image, physical symptoms, and general health in studies assessing change in quality of life.  PHQ-9: Review Flowsheet  05/20/2022 03/14/2022  Depression screen PHQ 2/9  Decreased Interest 0 0  Down, Depressed, Hopeless 0 0  PHQ - 2 Score 0 0  Altered sleeping 0 -  Tired, decreased energy 2 -  Change in appetite 0 -  Feeling bad or failure about yourself  0 -  Trouble concentrating 0 -  Moving slowly or fidgety/restless 0 -  Suicidal thoughts 0 -  PHQ-9 Score 2 -  Difficult doing work/chores Not difficult at all -    Details           Interpretation of Total Score  Total Score Depression Severity:  1-4 = Minimal depression, 5-9 = Mild depression, 10-14 = Moderate depression, 15-19 = Moderately severe depression, 20-27 = Severe depression   Psychosocial Evaluation and Intervention:  Psychosocial Evaluation - 05/13/22 1412       Psychosocial Evaluation & Interventions   Interventions Encouraged to exercise with the program and follow exercise prescription    Comments Logan Crawford 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 He is wearing a nicotine patch on and off and is trying to quit completly. He states he doesn't have really any stress concerns besides when he is in pain it makes things more difficult. He does report sleep concerns, that it is hard to stay asleep. He also needs a sleep study to evaluate for sleep apnea. He really enjoys playing pool and is back to playing since his CABG. He wants to work on exercising more and understanding how to live a heart healthy lifestyle.    Expected Outcomes Short: attend  cardiac rehab for education and exercise. Long: develop and maintain positive self care habits.    Continue Psychosocial Services  Follow up required by staff             Psychosocial Re-Evaluation:  Psychosocial Re-Evaluation     Row Name 06/26/22 1546 07/29/22 1156           Psychosocial Re-Evaluation   Current issues with Current Stress Concerns;Current Sleep Concerns Current Stress Concerns;Current Sleep Concerns      Comments Logan Crawford is doing well in rehab.  He is working on getting set up for a second sleep study on 5/14 as his CPAP is not working well for him.  He is only able to sleep for around 2 hours before waking routines.  He has also noted some swelling in his stomach and gained 20lb in one week.  He is also working with the doctor on this.  He is still working on quitting smoking and says stress is a contributing factor to not being able to drop last two.  We talked about trying to replace those with something else to help.  He was also encouraged to exercise more to help boost sleep and mood. Logan Crawford has been out sick for a couple of weeks.  He has not been working and still waiting to hear back about his sleep study.  He is also set up to meet with pulmonology soon.  He continues to work on quitting smoking and we talked abotu trying substititue again.      Expected Outcomes Short: Work with docotrs on sleep study and stomach Long: Add in more exercise mental boost Short: Hear back about sleep study Long: Continue to exercise for mental boost      Interventions Stress management education;Encouraged to attend Cardiac Rehabilitation for the exercise --      Continue Psychosocial Services  Follow up required by staff --               Psychosocial Discharge (Final Psychosocial Re-Evaluation):  Psychosocial Re-Evaluation - 07/29/22 1156       Psychosocial Re-Evaluation   Current issues with Current Stress Concerns;Current Sleep Concerns    Comments Logan Crawford has been out sick  for a couple of weeks.  He has not been working and still waiting to hear back about his sleep study.  He is also set up to meet with pulmonology soon.  He continues to work on quitting smoking and we talked abotu trying substititue again.    Expected Outcomes Short: Hear back about sleep study Long: Continue to exercise for mental boost             Vocational Rehabilitation: Provide vocational rehab assistance to qualifying candidates.   Vocational Rehab Evaluation & Intervention:  Vocational Rehab - 05/13/22 1408       Initial Vocational Rehab Evaluation & Intervention   Assessment shows need for Vocational Rehabilitation No             Education: Education Goals: Education classes will be provided on a variety of topics geared toward better understanding of heart health and risk factor modification. Participant will state understanding/return demonstration of topics presented as noted by education test scores.  Learning Barriers/Preferences:  Learning Barriers/Preferences - 05/13/22 1408       Learning Barriers/Preferences   Learning Barriers None    Learning Preferences None             General Cardiac Education Topics:  AED/CPR: - Group verbal and written instruction with the use of models to demonstrate the basic use of the AED with the basic ABC's of resuscitation.   Anatomy and Cardiac Procedures: - Group verbal and visual presentation and models provide information about basic cardiac anatomy and function. Reviews the testing methods done to diagnose heart disease and the outcomes of the test results. Describes the treatment choices: Medical Management, Angioplasty, or Coronary Bypass Surgery for treating various heart conditions including Myocardial Infarction, Angina, Valve Disease, and Cardiac Arrhythmias.  Written material given at graduation. Flowsheet Row Cardiac Rehab from 05/20/2022 in [facility] Card[facility] and Pul[facility] Rehab  Education need identified  05/20/22       Medication Safety: - Group verbal and visual instruction to review commonly prescribed medications for heart and lung disease. Reviews the medication, class of the drug, and side effects. Includes the steps to properly store meds and maintain the prescription regimen.  Written material given at graduation.   Intimacy: - Group verbal instruction through game format to discuss how heart and lung disease can affect sexual intimacy. Written material given at graduation..   Know Your Numbers and Heart Failure: - Group verbal and visual instruction to discuss disease risk factors for cardiac and pulmonary disease and treatment options.  Reviews associated critical values for Overweight/Obesity, Hypertension, Cholesterol, and Diabetes.  Discusses basics of heart failure: signs/symptoms and treatments.  Introduces Heart Failure Zone chart for action plan for heart failure.  Written material given at graduation.   Infection Prevention: - Provides verbal and written material to individual with discussion of infection control including proper hand washing and proper equipment cleaning during exercise session. Flowsheet Row Cardiac Rehab from 05/20/2022 in [facility] Cardiac and Pulmonary Rehab  Date 05/20/22  Educator NT  Instruction Review Code 1- Verbalizes Understanding       Falls Prevention: - Provides verbal and written material to individual with  discussion of falls prevention and safety. Flowsheet Row Cardiac Rehab from 05/20/2022 in [facility] Cardiac and Pulmonary Rehab  Date 05/20/22  Educator NT  Instruction Review Code 1- Verbalizes Understanding       Other: -Provides group and verbal instruction on various topics (see comments)   Knowledge Questionnaire Score:  Knowledge Questionnaire Score - 05/20/22 1411       Knowledge Questionnaire Score   Pre Score 19/26             Core Components/Risk Factors/Patient Goals at Admission:  Personal Goals and Risk  Factors at Admission - 05/13/22 1406       Core Components/Risk Factors/Patient Goals on Admission   Tobacco Cessation Yes   wearing patch   Number of packs per day 0    Expected Outcomes Long Term: Complete abstinence from all tobacco products for at least 12 months from quit date.    Hypertension Yes    Intervention Provide education on lifestyle modifcations including regular physical activity/exercise, weight management, moderate sodium restriction and increased consumption of fresh fruit, vegetables, and low fat dairy, alcohol moderation, and smoking cessation.;Monitor prescription use compliance.    Expected Outcomes Short Term: Continued assessment and intervention until BP is < 140/46mm HG in hypertensive participants. < 130/9mm HG in hypertensive participants with diabetes, heart failure or chronic kidney disease.;Long Term: Maintenance of blood pressure at goal levels.             Education:Diabetes - Individual verbal and written instruction to review signs/symptoms of diabetes, desired ranges of glucose level fasting, after meals and with exercise. Acknowledge that pre and post exercise glucose checks will be done for 3 sessions at entry of program.   Core Components/Risk Factors/Patient Goals Review:   Goals and Risk Factor Review     Row Name 06/26/22 1552 07/29/22 1159           Core Components/Risk Factors/Patient Goals Review   Personal Goals Review Weight Management/Obesity;Tobacco Cessation;Hypertension Weight Management/Obesity;Tobacco Cessation;Hypertension      Review Logan Crawford is doing well in rehab.  He is working on weigh loss.  His weight went up 20 lb in one week and he went to doctor and they did xrays and red vest and could not find fluid.  He is still working with them and on lasix to help.  His pressures are doing well, but slightly elevated and could be related to his meals out. He is also still smoking, but down to 2-3 cigs a day.  When we talked about when  they are, they are his usually after meal cigarettes.  He had used patches before and the nicotine makes him want to smoke more.  Thus, we talked about replacing his after meal cigarette with a different activity like chewing a straw or gum to help kick the habit.  He was open to trying that out. Logan Crawford returned to rehab today after being out sick.  His weight is still trending up with eating out and quitting smoking.  He is still at 2-3 cigarettes a day.  We talked about finding a substittute for those to put in his mouth.  His pressures are doing well but were up a little with being sick.  His biggest issue is his breathing still and he is set to meet with pulmolnologist.      Expected Outcomes Short: Try out gum or straw for after meals Long: conitnue to work on cessation and weight loss. Short: Continue to work on cessation Long: continue  to work on weight loss               Core Components/Risk Factors/Patient Goals at Discharge (Final Review):   Goals and Risk Factor Review - 07/29/22 1159       Core Components/Risk Factors/Patient Goals Review   Personal Goals Review Weight Management/Obesity;Tobacco Cessation;Hypertension    Review Logan Crawford returned to rehab today after being out sick.  His weight is still trending up with eating out and quitting smoking.  He is still at 2-3 cigarettes a day.  We talked about finding a substittute for those to put in his mouth.  His pressures are doing well but were up a little with being sick.  His biggest issue is his breathing still and he is set to meet with pulmolnologist.    Expected Outcomes Short: Continue to work on cessation Long: continue to work on weight loss             ITP Comments:  ITP Comments     Row Name 05/13/22 1416 05/20/22 1402 05/27/22 1115 06/11/22 1156 07/09/22 0843   ITP Comments Initial phone call completed. Diagnosis can be found in [facility] 12/25. EP Orientation scheduled for Tuesday 3/26 at 9:30.  Logan Crawford has recently quit  tobacco use within the last 6 months. Intervention for relapse prevention was provided at the initial medical review. He was encouraged to continue to with tobacco cessation and was provided information on relapse prevention. Patient received information about combination therapy, tobacco cessation classes, quit line, and quit smoking apps in case of a relapse. Patient demonstrated understanding of this material.Staff will continue to provide encouragement and follow up with the patient throughout the program. Completed and gym orientation. Initial ITP created and sent for review to Dr. Bethann Punches, Medical Director. First full day of exercise!  Patient was oriented to gym and equipment including functions, settings, policies, and procedures.  Patient's individual exercise prescription and treatment plan were reviewed.  All starting workloads were established based on the results of the 6 minute walk test done at initial orientation visit.  The plan for exercise progression was also introduced and progression will be customized based on patient's performance and goals. 30 day review completed. ITP sent to Dr. Bethann Punches, Medical Director of Cardiac Rehab. Continue with ITP unless changes are made by physician.   Pt still new to program 30 Day review completed. Medical Director ITP review done, changes made as directed, and signed approval by Medical Director.    Row Name 08/06/22 0935 09/02/22 1430 09/03/22 1346 09/11/22 1539 09/18/22 1305   ITP Comments 30 Day review completed. Medical Director ITP review done, changes made as directed, and signed approval by Medical Director. 30 Day review completed. Medical Director ITP review done, changes made as directed, and signed approval by Medical Director. Called pt to follow up. He has not been to rehab since 6/6. Spoke with pt, he said he has been waiting to get his Bipap machine, he has not been sleeping well and has worsening fatigue. He was hoping once he  started his Bipap at night he will feel better to restart rehab sessions. Pt will get Bipap next week on Tuesday 7/16. Pt plans to restart rehab that following Thursday 7/18. Pt states he will call if unable to make session. Pt was a no show today for rehab. Pt states he received his Bipap yesterday and overslept today for rehab. Pt states he will plan to be at rehab next week 7/23.  Advised pt to call if unable to attend. Pt voiced understanding. Attempted to call pt. No answer. Unable to leave vm. Pt has not been to rehab since 6/6. Letter mailed to pt to notify of discharge date set for 10/02/22.    Row Name 10/01/22 1144           ITP Comments 30 Day review completed. Medical Director ITP review done, changes made as directed, and signed approval by Medical Director.                Comments:

## 2022-10-02 ENCOUNTER — Encounter: Payer: Self-pay | Admitting: *Deleted

## 2022-10-02 DIAGNOSIS — Z951 Presence of aortocoronary bypass graft: Secondary | ICD-10-CM

## 2022-10-02 NOTE — Progress Notes (Signed)
Discharge Summary: Alegandro Vankampen (DOB January 18, 1976)  Logan Crawford was discharged from cardiac rehab. He completed 13/36 sessions. He was unable to make it to class to complete rehab.    6 Minute Walk     Row Name 05/20/22 1425         6 Minute Walk   Phase Initial     Distance 1215 feet     Walk Time 6 minutes     # of Rest Breaks 0     MPH 2.3     METS 3.93     RPE 10     Perceived Dyspnea  1     VO2 Peak 13.74     Symptoms No     Resting HR 95 bpm     Resting BP 124/66     Resting Oxygen Saturation  98 %     Exercise Oxygen Saturation  during 6 min walk 97 %     Max Ex. HR 111 bpm     Max Ex. BP 132/74     2 Minute Post BP 116/72

## 2022-10-02 NOTE — Progress Notes (Signed)
Cardiac Individual Treatment Plan  Patient Details  Name: Logan Crawford MRN: [id number] Date of Birth: 03/04/1975 Referring Provider:   Flowsheet Row Cardiac Rehab from 05/20/2022 in [facility] Cardiac and Pulmonary Rehab  Referring Provider Dr. Debbe Odea, MD       Initial Encounter Date:  Flowsheet Row Cardiac Rehab from 05/20/2022 in [facility] Cardiac and Pulmonary Rehab  Date 05/20/22       Visit Diagnosis: S/P CABG x 3  Patient's Home Medications on Admission:  Current Outpatient Medications:    aspirin EC 81 MG tablet, Take 1 tablet (81 mg total) by mouth daily. Swallow whole., Disp: 90 tablet, Rfl: 3   clopidogrel (PLAVIX) 75 MG tablet, Take 1 tablet (75 mg total) by mouth daily., Disp: 90 tablet, Rfl: 3   empagliflozin (JARDIANCE) 10 MG TABS tablet, Take 10 mg by mouth daily., Disp: , Rfl:    famotidine (PEPCID) 20 MG tablet, Take 1 tablet by mouth twice daily, Disp: 60 tablet, Rfl: 0   Fluticasone-Umeclidin-Vilant (TRELEGY ELLIPTA) 100-62.5-25 MCG/ACT AEPB, Inhale 1 puff into the lungs daily., Disp: 28 each, Rfl: 0   furosemide (LASIX) 40 MG tablet, Take 1 tablet (40 mg total) by mouth daily., Disp: 90 tablet, Rfl: 3   gabapentin (NEURONTIN) 100 MG capsule, Take 1 capsule (100 mg total) by mouth 2 (two) times daily., Disp: 60 capsule, Rfl: 2   losartan (COZAAR) 50 MG tablet, Take 1 tablet (50 mg total) by mouth daily., Disp: 90 tablet, Rfl: 3   metoprolol succinate (TOPROL-XL) 25 MG 24 hr tablet, Take 2 tablets (50 mg total) by mouth daily. Stop taking metoprolol if you continue to use cocaine, Disp: 180 tablet, Rfl: 3   nicotine (NICODERM CQ - DOSED IN MG/24 HOURS) 21 mg/24hr patch, Place 1 patch (21 mg total) onto the skin daily., Disp: 28 patch, Rfl: 0   nitroGLYCERIN (NITROSTAT) 0.4 MG SL tablet, Place 1 tablet (0.4 mg total) under the tongue every 5 (five) minutes as needed for chest pain (if chest pain not resolved after 3 doses, please call 911)., Disp: 25 tablet, Rfl:  2   pantoprazole (PROTONIX) 40 MG tablet, Take 1 tablet (40 mg total) by mouth 2 (two) times daily., Disp: 60 tablet, Rfl: 1   rosuvastatin (CRESTOR) 40 MG tablet, Take 1 tablet (40 mg total) by mouth daily., Disp: 90 tablet, Rfl: 3   spironolactone (ALDACTONE) 25 MG tablet, Take 1 tablet (25 mg total) by mouth daily. (Patient not taking: Reported on 08/20/2022), Disp: 90 tablet, Rfl: 3   VENTOLIN HFA 108 (90 Base) MCG/ACT inhaler, SMARTSIG:1 Via Inhaler Daily, Disp: , Rfl:   Current Facility-Administered Medications:    0.9 %  sodium chloride infusion, 500 mL, Intravenous, Continuous, Cirigliano, Vito V, DO  Past Medical History: Past Medical History:  Diagnosis Date   CAD (coronary artery disease)    a. 11/2019 NSTEMI/PCI: LM min irregs, LAD 46m (2.75x22 Resolute Onyx DES), 40d, D2 min irregs, LCX/LPAV min irregs, RCA 60ost, 40p/m, EF 45-50%; b. 11/2020 MV: EF 43%, small area of apical ischemia ->? over processing-->low risk.   Hyperlipidemia    Hypertension    Ischemic cardiomyopathy    a. 11/2019 LV Gram: EF 45-50%; b. 12/2019 Echo: EF 50% w/ mod mid-apical anteroseptal HK, GrI DD, nl RV fxn, mild Ao sclerosis.   Tobacco use 11/25/2019    Tobacco Use: Social History   Tobacco Use  Smoking Status Every Day   Current packs/day: 1.00   Average packs/day: 1 pack/day for 10.0  years (10.0 ttl pk-yrs)   Types: Cigarettes   Passive exposure: Past  Smokeless Tobacco Never  Tobacco Comments   2-3 cigarettes daily- 08/06/2022    Labs: Review Flowsheet  More data exists      Latest Ref Rng & Units 02/21/2022 02/22/2022 02/23/2022 04/14/2022 05/02/2022  Labs for ITP Cardiac and Pulmonary Rehab  Cholestrol 0 - 200 mg/dL - - - - 161   LDL (calc) 0 - 99 mg/dL - - - - 46   HDL-C >09 mg/dL - - - - 26   Trlycerides <150 mg/dL - - - - 604   Hemoglobin A1c 4.8 - 5.6 % - - - 5.2  -  O2 Saturation % 62.6  44.6  52.6  44.8  - -    Details       Multiple values from one day are sorted in  reverse-chronological order          Exercise Target Goals: Exercise Program Goal: Individual exercise prescription set using results from initial 6 min walk test and THRR while considering  patient's activity barriers and safety.   Exercise Prescription Goal: Initial exercise prescription builds to 30-45 minutes a day of aerobic activity, 2-3 days per week.  Home exercise guidelines will be given to patient during program as part of exercise prescription that the participant will acknowledge.   Education: Aerobic Exercise: - Group verbal and visual presentation on the components of exercise prescription. Introduces F.I.T.T principle from ACSM for exercise prescriptions.  Reviews F.I.T.T. principles of aerobic exercise including progression. Written material given at graduation. Flowsheet Row Cardiac Rehab from 05/20/2022 in [facility] Cardiac and Pulmonary Rehab  Education need identified 05/20/22       Education: Resistance Exercise: - Group verbal and visual presentation on the components of exercise prescription. Introduces F.I.T.T principle from ACSM for exercise prescriptions  Reviews F.I.T.T. principles of resistance exercise including progression. Written material given at graduation.    Education: Exercise & Equipment Safety: - Individual verbal instruction and demonstration of equipment use and safety with use of the equipment. Flowsheet Row Cardiac Rehab from 05/20/2022 in [facility] Cardiac and Pulmonary Rehab  Date 05/20/22  Educator NT  Instruction Review Code 1- Verbalizes Understanding       Education: Exercise Physiology & General Exercise Guidelines: - Group verbal and written instruction with models to review the exercise physiology of the cardiovascular system and associated critical values. Provides general exercise guidelines with specific guidelines to those with heart or lung disease.    Education: Flexibility, Balance, Mind/Body Relaxation: - Group verbal and  visual presentation with interactive activity on the components of exercise prescription. Introduces F.I.T.T principle from ACSM for exercise prescriptions. Reviews F.I.T.T. principles of flexibility and balance exercise training including progression. Also discusses the mind body connection.  Reviews various relaxation techniques to help reduce and manage stress (i.e. Deep breathing, progressive muscle relaxation, and visualization). Balance handout provided to take home. Written material given at graduation.   Activity Barriers & Risk Stratification:  Activity Barriers & Cardiac Risk Stratification - 05/20/22 1429       Activity Barriers & Cardiac Risk Stratification   Activity Barriers Back Problems;Shortness of Breath   Occasional pain in knees, feet, and hands   Cardiac Risk Stratification High             6 Minute Walk:  6 Minute Walk     Row Name 05/20/22 1425         6 Minute Walk   Phase Initial  Distance 1215 feet     Walk Time 6 minutes     # of Rest Breaks 0     MPH 2.3     METS 3.93     RPE 10     Perceived Dyspnea  1     VO2 Peak 13.74     Symptoms No     Resting HR 95 bpm     Resting BP 124/66     Resting Oxygen Saturation  98 %     Exercise Oxygen Saturation  during 6 min walk 97 %     Max Ex. HR 111 bpm     Max Ex. BP 132/74     2 Minute Post BP 116/72              Oxygen Initial Assessment:   Oxygen Re-Evaluation:   Oxygen Discharge (Final Oxygen Re-Evaluation):   Initial Exercise Prescription:  Initial Exercise Prescription - 05/20/22 1400       Date of Initial Exercise RX and Referring Provider   Date 05/20/22    Referring Provider Dr. Debbe Odea, MD      Oxygen   Maintain Oxygen Saturation 88% or higher      Treadmill   MPH 2    Grade 1    Minutes 15    METs 2.81      NuStep   Level 3    SPM 80    Minutes 15    METs 3.93      Recumbant Elliptical   Level 1.5    RPM 50    Minutes 15    METs 3.93       Prescription Details   Frequency (times per week) 2    Duration Progress to 30 minutes of continuous aerobic without signs/symptoms of physical distress      Intensity   THRR 40-80% of Max Heartrate 125-156    Ratings of Perceived Exertion 11-13    Perceived Dyspnea 0-4      Progression   Progression Continue to progress workloads to maintain intensity without signs/symptoms of physical distress.      Resistance Training   Training Prescription Yes    Weight 4 lb    Reps 10-15             Perform Capillary Blood Glucose checks as needed.  Exercise Prescription Changes:   Exercise Prescription Changes     Row Name 05/20/22 1400 06/05/22 1400 06/17/22 1500 07/01/22 0800 07/15/22 1500     Response to Exercise   Blood Pressure (Admit) 124/66 112/66 108/60 146/74 132/70   Blood Pressure (Exercise) 132/74 144/78 116/68 126/74 152/74   Blood Pressure (Exit) 116/72 98/56 106/62 120/70 124/70   Heart Rate (Admit) 95 bpm 93 bpm 92 bpm 93 bpm 88 bpm   Heart Rate (Exercise) 111 bpm 113 bpm 110 bpm 110 bpm 124 bpm   Heart Rate (Exit) 98 bpm 97 bpm 87 bpm 94 bpm 105 bpm   Oxygen Saturation (Admit) 98 % -- -- -- --   Oxygen Saturation (Exercise) 97 % -- -- -- --   Rating of Perceived Exertion (Exercise) 10 13 15 13 17    Perceived Dyspnea (Exercise) 1 -- -- -- --   Symptoms None none none none none   Comments Results First two days of exercise -- -- --   Duration -- Continue with 30 min of aerobic exercise without signs/symptoms of physical distress. Continue with 30 min of aerobic exercise without signs/symptoms of  physical distress. Continue with 30 min of aerobic exercise without signs/symptoms of physical distress. Continue with 30 min of aerobic exercise without signs/symptoms of physical distress.   Intensity -- THRR unchanged THRR unchanged THRR unchanged THRR unchanged     Progression   Progression -- Continue to progress workloads to maintain intensity without  signs/symptoms of physical distress. Continue to progress workloads to maintain intensity without signs/symptoms of physical distress. Continue to progress workloads to maintain intensity without signs/symptoms of physical distress. Continue to progress workloads to maintain intensity without signs/symptoms of physical distress.   Average METs -- 2.79 2.91 3.23 3.53     Resistance Training   Training Prescription -- Yes Yes Yes Yes   Weight -- 4 lb 4 lb 4 lb 4 lb   Reps -- 10-15 10-15 10-15 10-15     Interval Training   Interval Training -- No No No Yes   Equipment -- -- -- -- REL-XR   Comments -- -- -- -- X mode -intervals up to level 13     Treadmill   MPH -- 2 2.1 2.2 3   Grade -- 1 2 2 2    Minutes -- 15 15 15 15    METs -- 2.81 3.19 3.29 4.12     NuStep   Level -- 3 7 -- 5   Minutes -- 15 15 -- 15   METs -- 2.7 3.1 -- 3.1     Recumbant Elliptical   Level -- -- 3 -- 3   Minutes -- -- 15 -- 15   METs -- -- 2.1 -- 2.4     Elliptical   Level -- -- -- -- 1  5 min   Minutes -- -- -- -- 15     REL-XR   Level -- -- -- 7 13  intervals   Minutes -- -- -- 15 15   METs -- -- -- 3.2 --     Oxygen   Maintain Oxygen Saturation -- 88% or higher 88% or higher 88% or higher 88% or higher    Row Name 08/14/22 0800             Response to Exercise   Blood Pressure (Admit) 118/76       Blood Pressure (Exit) 114/76       Heart Rate (Admit) 97 bpm       Heart Rate (Exercise) 110 bpm       Heart Rate (Exit) 69 bpm       Rating of Perceived Exertion (Exercise) 15       Symptoms none       Duration Continue with 30 min of aerobic exercise without signs/symptoms of physical distress.       Intensity THRR unchanged         Progression   Progression Continue to progress workloads to maintain intensity without signs/symptoms of physical distress.       Average METs 2.94         Resistance Training   Training Prescription Yes       Weight 4 lb       Reps 10-15         Interval  Training   Interval Training No         Treadmill   MPH 2.6       Grade 1       Minutes 15       METs 3.35         Recumbant Elliptical  Level 3       Minutes 15       METs 2.2         REL-XR   Level 1       Minutes 15         Oxygen   Maintain Oxygen Saturation 88% or higher                Exercise Comments:   Exercise Comments     Row Name 05/27/22 1115           Exercise Comments First full day of exercise!  Patient was oriented to gym and equipment including functions, settings, policies, and procedures.  Patient's individual exercise prescription and treatment plan were reviewed.  All starting workloads were established based on the results of the 6 minute walk test done at initial orientation visit.  The plan for exercise progression was also introduced and progression will be customized based on patient's performance and goals.                Exercise Goals and Review:   Exercise Goals     Row Name 05/20/22 1430             Exercise Goals   Increase Physical Activity Yes       Intervention Provide advice, education, support and counseling about physical activity/exercise needs.;Develop an individualized exercise prescription for aerobic and resistive training based on initial evaluation findings, risk stratification, comorbidities and participant's personal goals.       Expected Outcomes Short Term: Attend rehab on a regular basis to increase amount of physical activity.;Long Term: Exercising regularly at least 3-5 days a week.;Long Term: Add in home exercise to make exercise part of routine and to increase amount of physical activity.       Increase Strength and Stamina Yes       Intervention Provide advice, education, support and counseling about physical activity/exercise needs.;Develop an individualized exercise prescription for aerobic and resistive training based on initial evaluation findings, risk stratification, comorbidities and  participant's personal goals.       Expected Outcomes Short Term: Increase workloads from initial exercise prescription for resistance, speed, and METs.;Short Term: Perform resistance training exercises routinely during rehab and add in resistance training at home;Long Term: Improve cardiorespiratory fitness, muscular endurance and strength as measured by increased METs and functional capacity ( )       Able to understand and use rate of perceived exertion (RPE) scale Yes       Intervention Provide education and explanation on how to use RPE scale       Expected Outcomes Short Term: Able to use RPE daily in rehab to express subjective intensity level;Long Term:  Able to use RPE to guide intensity level when exercising independently       Able to understand and use Dyspnea scale Yes       Intervention Provide education and explanation on how to use Dyspnea scale       Expected Outcomes Short Term: Able to use Dyspnea scale daily in rehab to express subjective sense of shortness of breath during exertion;Long Term: Able to use Dyspnea scale to guide intensity level when exercising independently       Knowledge and understanding of Target Heart Rate Range (THRR) Yes       Intervention Provide education and explanation of THRR including how the numbers were predicted and where they are located for reference       Expected Outcomes  Short Term: Able to state/look up THRR;Long Term: Able to use THRR to govern intensity when exercising independently;Short Term: Able to use daily as guideline for intensity in rehab       Able to check pulse independently Yes       Intervention Provide education and demonstration on how to check pulse in carotid and radial arteries.;Review the importance of being able to check your own pulse for safety during independent exercise       Expected Outcomes Short Term: Able to explain why pulse checking is important during independent exercise;Long Term: Able to check pulse  independently and accurately       Understanding of Exercise Prescription Yes       Intervention Provide education, explanation, and written materials on patient's individual exercise prescription       Expected Outcomes Short Term: Able to explain program exercise prescription;Long Term: Able to explain home exercise prescription to exercise independently                Exercise Goals Re-Evaluation :  Exercise Goals Re-Evaluation     Row Name 05/27/22 1115 06/05/22 1410 06/17/22 1542 06/26/22 1544 07/01/22 0848     Exercise Goal Re-Evaluation   Exercise Goals Review Increase Physical Activity;Able to understand and use rate of perceived exertion (RPE) scale;Knowledge and understanding of Target Heart Rate Range (THRR);Understanding of Exercise Prescription;Increase Strength and Stamina;Able to check pulse independently Increase Physical Activity;Increase Strength and Stamina;Understanding of Exercise Prescription Increase Physical Activity;Increase Strength and Stamina;Understanding of Exercise Prescription Increase Physical Activity;Increase Strength and Stamina;Understanding of Exercise Prescription Increase Physical Activity;Increase Strength and Stamina;Understanding of Exercise Prescription   Comments Reviewed RPE scale, THR and program prescription with pt today.  Pt voiced understanding and was given a copy of goals to take home. Logan Crawford is off to a good start in rehab. He did well with the treadmill at a speed of 2 mph with an incline of 1%. He also worked at level 3 on the T4 nustep during his first two sessions. He has done well with 4 lb hand weights for resistance training as well. We will continue to monitor his progress in the program. Logan Crawford continues to do well in rehab. He increased to level 7 on the T4 Nustep and up to level 3 on the REL. He increased his incline to 4% on the treadmill for 1 session. He is not quite hitting his THR  but RPEs are staying appropriate. We will continue to  monitor. Logan Crawford is off to a good start in rehab.  He feels that he is struggling to get here twice a week, but admits that it does get him up and out of bed.  We offered a later class time, but he said that he was goo.  He is not doing much outside of rehab yet, but plans to rejoin  [employer] to start working out more. We will review home exercise with him soon.  He is a fan of the sauna at [facility]! Logan Crawford has only attended rehab twice since the last review. He increased his overall average MET level to 3.23 METs. He increased his speed on the treadmill to 2.2 mph while maintaining an incline of 2% as well.  He also improved to level 7 on the XR. We will continue to monitor his progress in the program.   Expected Outcomes Short: Use RPE daily to regulate intensity.  Long: Follow program prescription in THR. Short: Continue to follow current exercise prescription and progressively increase  workloads. Long: Continue to improve strength and stamina. Short: Slowly progress incline back up beyond baseline of 2% Long: Continue to increase overall MET level and stamina Short: Review home exercise guidelines Long: Add in more exercise on off days Short: Continue to progressively increase treadmill workload. Long: Continue to improve strength and stamina.    Row Name 07/15/22 1515 07/29/22 1154 07/31/22 1501 08/14/22 0854 08/27/22 1538     Exercise Goal Re-Evaluation   Exercise Goals Review Increase Physical Activity;Increase Strength and Stamina;Understanding of Exercise Prescription Increase Physical Activity;Increase Strength and Stamina;Understanding of Exercise Prescription Increase Physical Activity;Increase Strength and Stamina;Understanding of Exercise Prescription Increase Physical Activity;Increase Strength and Stamina;Understanding of Exercise Prescription Increase Physical Activity;Increase Strength and Stamina;Understanding of Exercise Prescription   Comments Logan Crawford continues to do well in rehab. He tried out  the elliptical for the first time and was able to complete 5 minutes. He started doing intervals up to level 13 on the XR.  He also increased to a 3.0 mph speed on the treadmill! We will continue to monitor. Logan Crawford has been out sick for the last two weeks.  His breathing has also gotten worse and he is scheduled to meet with a pulmonologist soon for a work up.  He has not been exercising with feeling sick, but he has been stretching more! Logan Crawford has not attended rehab since the last review due to being out sick. We will continue to monitor his progress once he returns to regular attendance in the program. Logan Crawford has only attended two rehab sessions since the last review after returning from being sick. His treadmill workload has decreased both the speed down to 2.6 mph and the incline down to 1%. He has continued to work at level 3 on the recumbent elliptical. He also has continued to use 4 lb hand weights for resistance training. We will continue to monitor his progress in the program. Logan Crawford has not attended rehab since the last review. We will call to check in on patient. We will continue to monitor his progress in the program.   Expected Outcomes Short: Continue to increase duration on elliptical slowly Long: Continue to increase overall MET level and stamina Short: Add exercise back into routine Long: Continue to improve stamina Short: Return to regular attendance in rehab. Long: Continue to improve strength and stamina. Short: Increase treadmill workload back up to previous speed and incline. Long: Continue to improve strength and stamina. Short: Return to regular attendance in the program. Long: Continue to increase strength and stamina.    Row Name 09/12/22 0844             Exercise Goal Re-Evaluation   Exercise Goals Review Increase Physical Activity;Increase Strength and Stamina;Understanding of Exercise Prescription       Comments Logan Crawford has not attended rehab since the last review. We called to check in  on patient and he stated he will plan to be at rehab next week 7/23. We will continue to monitor his progress in the program.       Expected Outcomes Short: Return to regular attendance in the program. Long: Continue exercise to increase strength and stamina.                Discharge Exercise Prescription (Final Exercise Prescription Changes):  Exercise Prescription Changes - 08/14/22 0800       Response to Exercise   Blood Pressure (Admit) 118/76    Blood Pressure (Exit) 114/76    Heart Rate (Admit) 97 bpm  Heart Rate (Exercise) 110 bpm    Heart Rate (Exit) 69 bpm    Rating of Perceived Exertion (Exercise) 15    Symptoms none    Duration Continue with 30 min of aerobic exercise without signs/symptoms of physical distress.    Intensity THRR unchanged      Progression   Progression Continue to progress workloads to maintain intensity without signs/symptoms of physical distress.    Average METs 2.94      Resistance Training   Training Prescription Yes    Weight 4 lb    Reps 10-15      Interval Training   Interval Training No      Treadmill   MPH 2.6    Grade 1    Minutes 15    METs 3.35      Recumbant Elliptical   Level 3    Minutes 15    METs 2.2      REL-XR   Level 1    Minutes 15      Oxygen   Maintain Oxygen Saturation 88% or higher             Nutrition:  Target Goals: Understanding of nutrition guidelines, daily intake of sodium 1500mg , cholesterol 200mg , calories 30% from fat and 7% or less from saturated fats, daily to have 5 or more servings of fruits and vegetables.  Education: All About Nutrition: -Group instruction provided by verbal, written material, interactive activities, discussions, models, and posters to present general guidelines for heart healthy nutrition including fat, fiber, MyPlate, the role of sodium in heart healthy nutrition, utilization of the nutrition label, and utilization of this knowledge for meal planning. Follow up  email sent as well. Written material given at graduation. Flowsheet Row Cardiac Rehab from 05/20/2022 in [facility] Cardiac and Pulmonary Rehab  Education need identified 05/20/22       Biometrics:  Pre Biometrics - 05/20/22 1432       Pre Biometrics   Height 6' (1.829 m)    Weight 226 lb 12.8 oz (102.9 kg)    Waist Circumference 40.5 inches    Hip Circumference 43 inches    Waist to Hip Ratio 0.94 %    BMI (Calculated) 30.75    Single Leg Stand 30 seconds              Nutrition Therapy Plan and Nutrition Goals:  Nutrition Therapy & Goals - 05/20/22 1131       Nutrition Therapy   Diet Heart healthy, low Na    Drug/Food Interactions Statins/Certain Fruits    Protein (specify units) 65g   CKD stg 3: 0.6g/kg/d   Fiber 30 grams    Whole Grain Foods 3 servings    Saturated Fats 16 max. grams    Fruits and Vegetables 8 servings/day    Sodium 2 grams      Personal Nutrition Goals   Nutrition Goal ST: review handouts, focus on lean meats, limit creamy or cheese sauces or add ons, and add fruit and vegetable servings to meals when going out LT: limit Na <2g, limit saturated fat <16g/day, aim for at least 30g of fiber/day    Comments 46 y.o. M admitted to cardiac rehab s/p CABG x 3. PMHx includes HTN, NSTEMI (2021 and 2023), CAD, chronic diastolic CHF, GERD, Stg 3a CKD, HLD, polysubstance abuse, former tobacco use, hypercalcemia. Medications reviewed symbicort, jardiance, pepcid, furosemide, nicotine, pantoprazole, rosuvastatin. B: sausage egg and cheese on toast or raisin bran L: chik-fil-a or grilled chicken  or ribeye steak sandwich - he has been trying to limit [employer] burger D: [place] food 2x/week, sushi, salmon S; chips or candy bars or pickles Drinks: water, gatorade (grape - regular) 2-3x/day and sweet tea 3-4x/day. He reports not cooking at home most of the time and would like to focus on modifications eating out as he does not want to cook - discussed some options to put  together at home too like rotisserie chicken, microwave grains like brown rice, and frozen vegetables. Also discussed general heart healthy eating and considerations while eating out.      Intervention Plan   Intervention Prescribe, educate and counsel regarding individualized specific dietary modifications aiming towards targeted core components such as weight, hypertension, lipid management, diabetes, heart failure and other comorbidities.;Nutrition handout(s) given to patient.    Expected Outcomes Short Term Goal: Understand basic principles of dietary content, such as calories, fat, sodium, cholesterol and nutrients.;Short Term Goal: A plan has been developed with personal nutrition goals set during dietitian appointment.;Long Term Goal: Adherence to prescribed nutrition plan.             Nutrition Assessments:  MEDIFICTS Score Key: ?70 Need to make dietary changes  40-70 Heart Healthy Diet ? 40 Therapeutic Level Cholesterol Diet  Flowsheet Row Cardiac Rehab from 05/20/2022 in [facility] Cardiac and Pulmonary Rehab  Picture Your Plate Total Score on Admission 50      Picture Your Plate Scores: <56 Unhealthy dietary pattern with much room for improvement. 41-50 Dietary pattern unlikely to meet recommendations for good health and room for improvement. 51-60 More healthful dietary pattern, with some room for improvement.  >60 Healthy dietary pattern, although there may be some specific behaviors that could be improved.    Nutrition Goals Re-Evaluation:  Nutrition Goals Re-Evaluation     Row Name 06/26/22 1549 07/29/22 1158           Goals   Nutrition Goal ST: review handouts, focus on lean meats, limit creamy or cheese sauces or add ons, and add fruit and vegetable servings to meals when going out LT: limit Na <2g, limit saturated fat <16g/day, aim for at least 30g of fiber/day Short: Try premade meal kits at [employer] Long: Continue to reduce meals out      Comment Logan Crawford was able to  meet with dietitian and they talked about picking healthier choices when going out to eat.  John does not cook at home and finds it difficult.  Hei is still eating a lot of sodium from mulitple meals out.  He is trying to make better choices.  Trying to work on his cooking, we talked about trying pre-prepped meals that are easy to heat up (Not TV dinner, but store made kits).  He was open to trying meal kits at [employer]. Logan Crawford is doing well in rehab. He has not had the premade meal kits and still eating out.  He has gotten better about what he eats, but his weight is up and still eating out more so than not.  We also talked about how when he eats out the portions are bigger than what he would normally make/eat at home.      Expected Outcome Short: Try premade meal kits at [employer] Long: Continue to reduce meals out Short: Cut back on eating out Long: Continue to make healthier choices               Nutrition Goals Discharge (Final Nutrition Goals Re-Evaluation):  Nutrition Goals Re-Evaluation - 07/29/22 1158  Goals   Nutrition Goal Short: Try premade meal kits at [employer] Long: Continue to reduce meals out    Comment Logan Crawford is doing well in rehab. He has not had the premade meal kits and still eating out.  He has gotten better about what he eats, but his weight is up and still eating out more so than not.  We also talked about how when he eats out the portions are bigger than what he would normally make/eat at home.    Expected Outcome Short: Cut back on eating out Long: Continue to make healthier choices             Psychosocial: Target Goals: Acknowledge presence or absence of significant depression and/or stress, maximize coping skills, provide positive support system. Participant is able to verbalize types and ability to use techniques and skills needed for reducing stress and depression.   Education: Stress, Anxiety, and Depression - Group verbal and visual presentation to define topics  covered.  Reviews how body is impacted by stress, anxiety, and depression.  Also discusses healthy ways to reduce stress and to treat/manage anxiety and depression.  Written material given at graduation.   Education: Sleep Hygiene -Provides group verbal and written instruction about how sleep can affect your health.  Define sleep hygiene, discuss sleep cycles and impact of sleep habits. Review good sleep hygiene tips.    Initial Review & Psychosocial Screening:  Initial Psych Review & Screening - 05/13/22 1408       Initial Review   Current issues with Current Sleep Concerns      Family Dynamics   Good Support System? Yes      Barriers   Psychosocial barriers to participate in program There are no identifiable barriers or psychosocial needs.;The patient should benefit from training in stress management and relaxation.      Screening Interventions   Interventions Encouraged to exercise;Provide feedback about the scores to participant;To provide support and resources with identified psychosocial needs    Expected Outcomes Short Term goal: Utilizing psychosocial counselor, staff and physician to assist with identification of specific Stressors or current issues interfering with healing process. Setting desired goal for each stressor or current issue identified.;Long Term Goal: Stressors or current issues are controlled or eliminated.;Short Term goal: Identification and review with participant of any Quality of Life or Depression concerns found by scoring the questionnaire.;Long Term goal: The participant improves quality of Life and PHQ9 Scores as seen by post scores and/or verbalization of changes             Quality of Life Scores:   Quality of Life - 05/20/22 1411       Quality of Life   Select Quality of Life      Quality of Life Scores   Health/Function Pre 14.17 %    Socioeconomic Pre 15.07 %    Psych/Spiritual Pre 17.36 %    Family Pre 18.3 %    GLOBAL Pre 15.62 %             Scores of 19 and below usually indicate a poorer quality of life in these areas.  A difference of  2-3 points is a clinically meaningful difference.  A difference of 2-3 points in the total score of the Quality of Life Index has been associated with significant improvement in overall quality of life, self-image, physical symptoms, and general health in studies assessing change in quality of life.  PHQ-9: Review Flowsheet  05/20/2022 03/14/2022  Depression screen PHQ 2/9  Decreased Interest 0 0  Down, Depressed, Hopeless 0 0  PHQ - 2 Score 0 0  Altered sleeping 0 -  Tired, decreased energy 2 -  Change in appetite 0 -  Feeling bad or failure about yourself  0 -  Trouble concentrating 0 -  Moving slowly or fidgety/restless 0 -  Suicidal thoughts 0 -  PHQ-9 Score 2 -  Difficult doing work/chores Not difficult at all -    Details           Interpretation of Total Score  Total Score Depression Severity:  1-4 = Minimal depression, 5-9 = Mild depression, 10-14 = Moderate depression, 15-19 = Moderately severe depression, 20-27 = Severe depression   Psychosocial Evaluation and Intervention:  Psychosocial Evaluation - 05/13/22 1412       Psychosocial Evaluation & Interventions   Interventions Encouraged to exercise with the program and follow exercise prescription    Comments Logan Crawford 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 He is wearing a nicotine patch on and off and is trying to quit completly. He states he doesn't have really any stress concerns besides when he is in pain it makes things more difficult. He does report sleep concerns, that it is hard to stay asleep. He also needs a sleep study to evaluate for sleep apnea. He really enjoys playing pool and is back to playing since his CABG. He wants to work on exercising more and understanding how to live a heart healthy lifestyle.    Expected Outcomes Short: attend  cardiac rehab for education and exercise. Long: develop and maintain positive self care habits.    Continue Psychosocial Services  Follow up required by staff             Psychosocial Re-Evaluation:  Psychosocial Re-Evaluation     Row Name 06/26/22 1546 07/29/22 1156           Psychosocial Re-Evaluation   Current issues with Current Stress Concerns;Current Sleep Concerns Current Stress Concerns;Current Sleep Concerns      Comments Logan Crawford is doing well in rehab.  He is working on getting set up for a second sleep study on 5/14 as his CPAP is not working well for him.  He is only able to sleep for around 2 hours before waking routines.  He has also noted some swelling in his stomach and gained 20lb in one week.  He is also working with the doctor on this.  He is still working on quitting smoking and says stress is a contributing factor to not being able to drop last two.  We talked about trying to replace those with something else to help.  He was also encouraged to exercise more to help boost sleep and mood. Logan Crawford has been out sick for a couple of weeks.  He has not been working and still waiting to hear back about his sleep study.  He is also set up to meet with pulmonology soon.  He continues to work on quitting smoking and we talked abotu trying substititue again.      Expected Outcomes Short: Work with docotrs on sleep study and stomach Long: Add in more exercise mental boost Short: Hear back about sleep study Long: Continue to exercise for mental boost      Interventions Stress management education;Encouraged to attend Cardiac Rehabilitation for the exercise --      Continue Psychosocial Services  Follow up required by staff --               Psychosocial Discharge (Final Psychosocial Re-Evaluation):  Psychosocial Re-Evaluation - 07/29/22 1156       Psychosocial Re-Evaluation   Current issues with Current Stress Concerns;Current Sleep Concerns    Comments Logan Crawford has been out sick  for a couple of weeks.  He has not been working and still waiting to hear back about his sleep study.  He is also set up to meet with pulmonology soon.  He continues to work on quitting smoking and we talked abotu trying substititue again.    Expected Outcomes Short: Hear back about sleep study Long: Continue to exercise for mental boost             Vocational Rehabilitation: Provide vocational rehab assistance to qualifying candidates.   Vocational Rehab Evaluation & Intervention:  Vocational Rehab - 05/13/22 1408       Initial Vocational Rehab Evaluation & Intervention   Assessment shows need for Vocational Rehabilitation No             Education: Education Goals: Education classes will be provided on a variety of topics geared toward better understanding of heart health and risk factor modification. Participant will state understanding/return demonstration of topics presented as noted by education test scores.  Learning Barriers/Preferences:  Learning Barriers/Preferences - 05/13/22 1408       Learning Barriers/Preferences   Learning Barriers None    Learning Preferences None             General Cardiac Education Topics:  AED/CPR: - Group verbal and written instruction with the use of models to demonstrate the basic use of the AED with the basic ABC's of resuscitation.   Anatomy and Cardiac Procedures: - Group verbal and visual presentation and models provide information about basic cardiac anatomy and function. Reviews the testing methods done to diagnose heart disease and the outcomes of the test results. Describes the treatment choices: Medical Management, Angioplasty, or Coronary Bypass Surgery for treating various heart conditions including Myocardial Infarction, Angina, Valve Disease, and Cardiac Arrhythmias.  Written material given at graduation. Flowsheet Row Cardiac Rehab from 05/20/2022 in [facility] Cardiac and Pulmonary Rehab  Education need identified  05/20/22       Medication Safety: - Group verbal and visual instruction to review commonly prescribed medications for heart and lung disease. Reviews the medication, class of the drug, and side effects. Includes the steps to properly store meds and maintain the prescription regimen.  Written material given at graduation.   Intimacy: - Group verbal instruction through game format to discuss how heart and lung disease can affect sexual intimacy. Written material given at graduation..   Know Your Numbers and Heart Failure: - Group verbal and visual instruction to discuss disease risk factors for cardiac and pulmonary disease and treatment options.  Reviews associated critical values for Overweight/Obesity, Hypertension, Cholesterol, and Diabetes.  Discusses basics of heart failure: signs/symptoms and treatments.  Introduces Heart Failure Zone chart for action plan for heart failure.  Written material given at graduation.   Infection Prevention: - Provides verbal and written material to individual with discussion of infection control including proper hand washing and proper equipment cleaning during exercise session. Flowsheet Row Cardiac Rehab from 05/20/2022 in [facility] Cardiac and Pulmonary Rehab  Date 05/20/22  Educator NT  Instruction Review Code 1- Verbalizes Understanding       Falls Prevention: - Provides verbal and written material to individual with  discussion of falls prevention and safety. Flowsheet Row Cardiac Rehab from 05/20/2022 in [facility] Cardiac and Pulmonary Rehab  Date 05/20/22  Educator NT  Instruction Review Code 1- Verbalizes Understanding       Other: -Provides group and verbal instruction on various topics (see comments)   Knowledge Questionnaire Score:  Knowledge Questionnaire Score - 05/20/22 1411       Knowledge Questionnaire Score   Pre Score 19/26             Core Components/Risk Factors/Patient Goals at Admission:  Personal Goals and Risk  Factors at Admission - 05/13/22 1406       Core Components/Risk Factors/Patient Goals on Admission   Tobacco Cessation Yes   wearing patch   Number of packs per day 0    Expected Outcomes Long Term: Complete abstinence from all tobacco products for at least 12 months from quit date.    Hypertension Yes    Intervention Provide education on lifestyle modifcations including regular physical activity/exercise, weight management, moderate sodium restriction and increased consumption of fresh fruit, vegetables, and low fat dairy, alcohol moderation, and smoking cessation.;Monitor prescription use compliance.    Expected Outcomes Short Term: Continued assessment and intervention until BP is < 140/12mm HG in hypertensive participants. < 130/67mm HG in hypertensive participants with diabetes, heart failure or chronic kidney disease.;Long Term: Maintenance of blood pressure at goal levels.             Education:Diabetes - Individual verbal and written instruction to review signs/symptoms of diabetes, desired ranges of glucose level fasting, after meals and with exercise. Acknowledge that pre and post exercise glucose checks will be done for 3 sessions at entry of program.   Core Components/Risk Factors/Patient Goals Review:   Goals and Risk Factor Review     Row Name 06/26/22 1552 07/29/22 1159           Core Components/Risk Factors/Patient Goals Review   Personal Goals Review Weight Management/Obesity;Tobacco Cessation;Hypertension Weight Management/Obesity;Tobacco Cessation;Hypertension      Review Logan Crawford is doing well in rehab.  He is working on weigh loss.  His weight went up 20 lb in one week and he went to doctor and they did xrays and red vest and could not find fluid.  He is still working with them and on lasix to help.  His pressures are doing well, but slightly elevated and could be related to his meals out. He is also still smoking, but down to 2-3 cigs a day.  When we talked about when  they are, they are his usually after meal cigarettes.  He had used patches before and the nicotine makes him want to smoke more.  Thus, we talked about replacing his after meal cigarette with a different activity like chewing a straw or gum to help kick the habit.  He was open to trying that out. John returned to rehab today after being out sick.  His weight is still trending up with eating out and quitting smoking.  He is still at 2-3 cigarettes a day.  We talked about finding a substittute for those to put in his mouth.  His pressures are doing well but were up a little with being sick.  His biggest issue is his breathing still and he is set to meet with pulmolnologist.      Expected Outcomes Short: Try out gum or straw for after meals Long: conitnue to work on cessation and weight loss. Short: Continue to work on cessation Long: continue  to work on weight loss               Core Components/Risk Factors/Patient Goals at Discharge (Final Review):   Goals and Risk Factor Review - 07/29/22 1159       Core Components/Risk Factors/Patient Goals Review   Personal Goals Review Weight Management/Obesity;Tobacco Cessation;Hypertension    Review John returned to rehab today after being out sick.  His weight is still trending up with eating out and quitting smoking.  He is still at 2-3 cigarettes a day.  We talked about finding a substittute for those to put in his mouth.  His pressures are doing well but were up a little with being sick.  His biggest issue is his breathing still and he is set to meet with pulmolnologist.    Expected Outcomes Short: Continue to work on cessation Long: continue to work on weight loss             ITP Comments:  ITP Comments     Row Name 05/13/22 1416 05/20/22 1402 05/27/22 1115 06/11/22 1156 07/09/22 0843   ITP Comments Initial phone call completed. Diagnosis can be found in [facility] 12/25. EP Orientation scheduled for Tuesday 3/26 at 9:30.  Harfateh has recently quit  tobacco use within the last 6 months. Intervention for relapse prevention was provided at the initial medical review. He was encouraged to continue to with tobacco cessation and was provided information on relapse prevention. Patient received information about combination therapy, tobacco cessation classes, quit line, and quit smoking apps in case of a relapse. Patient demonstrated understanding of this material.Staff will continue to provide encouragement and follow up with the patient throughout the program. Completed and gym orientation. Initial ITP created and sent for review to Dr. Bethann Punches, Medical Director. First full day of exercise!  Patient was oriented to gym and equipment including functions, settings, policies, and procedures.  Patient's individual exercise prescription and treatment plan were reviewed.  All starting workloads were established based on the results of the 6 minute walk test done at initial orientation visit.  The plan for exercise progression was also introduced and progression will be customized based on patient's performance and goals. 30 day review completed. ITP sent to Dr. Bethann Punches, Medical Director of Cardiac Rehab. Continue with ITP unless changes are made by physician.   Pt still new to program 30 Day review completed. Medical Director ITP review done, changes made as directed, and signed approval by Medical Director.    Row Name 08/06/22 0935 09/02/22 1430 09/03/22 1346 09/11/22 1539 09/18/22 1305   ITP Comments 30 Day review completed. Medical Director ITP review done, changes made as directed, and signed approval by Medical Director. 30 Day review completed. Medical Director ITP review done, changes made as directed, and signed approval by Medical Director. Called pt to follow up. He has not been to rehab since 6/6. Spoke with pt, he said he has been waiting to get his Bipap machine, he has not been sleeping well and has worsening fatigue. He was hoping once he  started his Bipap at night he will feel better to restart rehab sessions. Pt will get Bipap next week on Tuesday 7/16. Pt plans to restart rehab that following Thursday 7/18. Pt states he will call if unable to make session. Pt was a no show today for rehab. Pt states he received his Bipap yesterday and overslept today for rehab. Pt states he will plan to be at rehab next week 7/23.  Advised pt to call if unable to attend. Pt voiced understanding. Attempted to call pt. No answer. Unable to leave vm. Pt has not been to rehab since 6/6. Letter mailed to pt to notify of discharge date set for 10/02/22.    Row Name 10/01/22 1144           ITP Comments 30 Day review completed. Medical Director ITP review done, changes made as directed, and signed approval by Medical Director.                Comments: Discharge ITP

## 2022-10-02 NOTE — Telephone Encounter (Signed)
Pt discharged. See encounter 10/02/22.

## 2022-10-16 ENCOUNTER — Other Ambulatory Visit: Payer: Self-pay | Admitting: Internal Medicine

## 2022-10-17 ENCOUNTER — Telehealth: Payer: Self-pay

## 2022-10-17 NOTE — Telephone Encounter (Signed)
Pt called requesting to get samples because he is almost out of his meds and has to wait 7 days for his new rx to come in the mail.   Pt came in to clinic to pick up Jardiance 10 MG samples (14 day supply)

## 2022-10-20 ENCOUNTER — Encounter: Payer: Self-pay | Admitting: Nurse Practitioner

## 2022-10-20 ENCOUNTER — Ambulatory Visit (INDEPENDENT_AMBULATORY_CARE_PROVIDER_SITE_OTHER): Payer: Medicaid Other | Admitting: Nurse Practitioner

## 2022-10-20 VITALS — BP 119/78 | HR 74 | Resp 16 | Wt 236.0 lb

## 2022-10-20 DIAGNOSIS — E782 Mixed hyperlipidemia: Secondary | ICD-10-CM | POA: Diagnosis not present

## 2022-10-20 DIAGNOSIS — R21 Rash and other nonspecific skin eruption: Secondary | ICD-10-CM

## 2022-10-20 DIAGNOSIS — I10 Essential (primary) hypertension: Secondary | ICD-10-CM

## 2022-10-20 DIAGNOSIS — J438 Other emphysema: Secondary | ICD-10-CM | POA: Diagnosis not present

## 2022-10-20 DIAGNOSIS — L729 Follicular cyst of the skin and subcutaneous tissue, unspecified: Secondary | ICD-10-CM

## 2022-10-20 DIAGNOSIS — K219 Gastro-esophageal reflux disease without esophagitis: Secondary | ICD-10-CM

## 2022-10-20 DIAGNOSIS — E669 Obesity, unspecified: Secondary | ICD-10-CM

## 2022-10-20 DIAGNOSIS — I2510 Atherosclerotic heart disease of native coronary artery without angina pectoris: Secondary | ICD-10-CM

## 2022-10-20 DIAGNOSIS — I5032 Chronic diastolic (congestive) heart failure: Secondary | ICD-10-CM | POA: Diagnosis not present

## 2022-10-20 DIAGNOSIS — Z720 Tobacco use: Secondary | ICD-10-CM

## 2022-10-20 DIAGNOSIS — A630 Anogenital (venereal) warts: Secondary | ICD-10-CM

## 2022-10-20 DIAGNOSIS — N1831 Chronic kidney disease, stage 3a: Secondary | ICD-10-CM

## 2022-10-20 MED ORDER — PANTOPRAZOLE SODIUM 40 MG PO TBEC
40.0000 mg | DELAYED_RELEASE_TABLET | Freq: Every day | ORAL | 1 refills | Status: DC
Start: 2022-10-20 — End: 2023-02-23

## 2022-10-20 MED ORDER — NICOTINE 21 MG/24HR TD PT24
21.0000 mg | MEDICATED_PATCH | Freq: Every day | TRANSDERMAL | 1 refills | Status: DC
Start: 2022-10-20 — End: 2023-01-26

## 2022-10-20 MED ORDER — NICOTINE 21 MG/24HR TD PT24
21.0000 mg | MEDICATED_PATCH | Freq: Every day | TRANSDERMAL | 0 refills | Status: DC
Start: 2022-10-20 — End: 2022-10-20

## 2022-10-20 MED ORDER — FAMOTIDINE 20 MG PO TABS: 20.0000 mg | ORAL_TABLET | Freq: Every evening | ORAL | 0 refills | Status: DC | PRN

## 2022-10-20 MED ORDER — NYSTATIN 100000 UNIT/GM EX POWD
1.0000 | Freq: Three times a day (TID) | CUTANEOUS | 0 refills | Status: DC
Start: 2022-10-20 — End: 2023-05-26

## 2022-10-20 MED ORDER — NYSTATIN 100000 UNIT/GM EX POWD
1.0000 | Freq: Three times a day (TID) | CUTANEOUS | 0 refills | Status: DC
Start: 2022-10-20 — End: 2022-10-20

## 2022-10-20 MED ORDER — TRIAMCINOLONE ACETONIDE 0.025 % EX OINT
1.0000 | TOPICAL_OINTMENT | Freq: Two times a day (BID) | CUTANEOUS | 0 refills | Status: DC
Start: 2022-10-20 — End: 2022-10-20

## 2022-10-20 MED ORDER — IMIQUIMOD 5 % EX CREA
TOPICAL_CREAM | CUTANEOUS | 0 refills | Status: DC
Start: 2022-10-20 — End: 2023-01-26

## 2022-10-20 NOTE — Progress Notes (Signed)
Established Patient Office Visit  Subjective:  Patient ID: Logan Crawford, male    DOB: 06-Jan-1976  Age: 47 y.o. MRN: [id number]  CC:  Chief Complaint  Patient presents with   Hypertension    HPI Logan Crawford is a 47 y.o. male  has a past medical history of CAD (coronary artery disease), Hyperlipidemia, Hypertension, Ischemic cardiomyopathy, and Tobacco use (11/25/2019).  Patient presents for follow-up for his chronic medical conditions  CHF.  Currently on Jardiance 10 mg daily, metoprolol 25 mg daily, Aldactone 25 mg daily, furosemide 40 mg daily.  Patient denies chest pain, dizziness, edema.  Has upcoming appointment with cardiology  Emphysema.  On Trelegy 1 puff daily, reports improvement in his breathing.  Wears CPAP at night for OSA.  Continues to smoke less than 1 pack of cigarettes daily.  Has nicotine patch ordered but has not been taking the medication   Patient complains of chronic anal genital warts that he has been freezing off using an OTC medication.  He is currently not sexually active.  He would like referral to dermatologist for removal of the warts  Patient complains of a painful cyst on his scrotal area that he has had for years, reports itchy skin around the groin area bilaterally.  Patient denies use of illicit drugs since 2023.  He was encouraged to continue to abstain from use of illicit drugs.    Patient encouraged to get the Tdap vaccine at the pharmacy     Past Medical History:  Diagnosis Date   CAD (coronary artery disease)    a. 11/2019 NSTEMI/PCI: LM min irregs, LAD 68m (2.75x22 Resolute Onyx DES), 40d, D2 min irregs, LCX/LPAV min irregs, RCA 60ost, 40p/m, EF 45-50%; b. 11/2020 MV: EF 43%, small area of apical ischemia ->? over processing-->low risk.   Hyperlipidemia    Hypertension    Ischemic cardiomyopathy    a. 11/2019 LV Gram: EF 45-50%; b. 12/2019 Echo: EF 50% w/ mod mid-apical anteroseptal HK, GrI DD, nl RV fxn, mild Ao sclerosis.    Tobacco use 11/25/2019    Past Surgical History:  Procedure Laterality Date   CORONARY ARTERY BYPASS GRAFT N/A 02/19/2022   Procedure: CORONARY ARTERY BYPASS GRAFTING (CABG) X THREE, USING ENDOSCOPICALLY HARVESTED RIGHT GREATER SAPHENOUS VEIN  AND LEFT ATRIAL APPENDAGE CLIPPING;  Surgeon: Lyn Hollingshead, MD;  Location: [facility] OR;  Service: Open Heart Surgery;  Laterality: N/A;   CORONARY STENT INTERVENTION N/A 01/24/2020   Procedure: CORONARY STENT INTERVENTION;  Surgeon: Iran Ouch, MD;  Location: [facility] INVASIVE CV LAB;  Service: Cardiovascular;  Laterality: N/A;   CORONARY STENT INTERVENTION N/A 05/20/2021   Procedure: CORONARY STENT INTERVENTION;  Surgeon: Iran Ouch, MD;  Location: [facility] INVASIVE CV LAB;  Service: Cardiovascular;  Laterality: N/A;   CORONARY ULTRASOUND/IVUS N/A 05/20/2021   Procedure: Intravascular Ultrasound/IVUS;  Surgeon: Iran Ouch, MD;  Location: [facility] INVASIVE CV LAB;  Service: Cardiovascular;  Laterality: N/A;   IRRIGATION AND DEBRIDEMENT KNEE Left    LEFT HEART CATH AND CORONARY ANGIOGRAPHY N/A 01/24/2020   Procedure: LEFT HEART CATH AND CORONARY ANGIOGRAPHY;  Surgeon: Iran Ouch, MD;  Location: [facility] INVASIVE CV LAB;  Service: Cardiovascular;  Laterality: N/A;   LEFT HEART CATH AND CORONARY ANGIOGRAPHY N/A 11/18/2021   Procedure: LEFT HEART CATH AND CORONARY ANGIOGRAPHY;  Surgeon: Iran Ouch, MD;  Location: [facility] INVASIVE CV LAB;  Service: Cardiovascular;  Laterality: N/A;   LEFT HEART CATH AND CORONARY ANGIOGRAPHY N/A 02/13/2022   Procedure:  LEFT HEART CATH AND CORONARY ANGIOGRAPHY;  Surgeon: Marykay Lex, MD;  Location: [facility] INVASIVE CV LAB;  Service: Cardiovascular;  Laterality: N/A;   LEFT HEART CATH AND CORS/GRAFTS ANGIOGRAPHY N/A 05/20/2021   Procedure: LEFT HEART CATH AND CORS/GRAFTS ANGIOGRAPHY;  Surgeon: Iran Ouch, MD;  Location: [facility] INVASIVE CV LAB;  Service: Cardiovascular;  Laterality: N/A;   TYMPANOSTOMY TUBE  PLACEMENT Bilateral     Family History  Problem Relation Age of Onset   Coronary artery disease Mother    Arrhythmia Mother        Pacemaker placed in 2021   Heart failure Mother    Breast cancer Mother    Coronary artery disease Father    Colon cancer Father    Colon polyps Father     Social History   Socioeconomic History   Marital status: Single    Spouse name: Not on file   Number of children: 3   Years of education: Not on file   Highest education level: Not on file  Occupational History   Not on file  Tobacco Use   Smoking status: Every Day    Current packs/day: 1.00    Average packs/day: 1 pack/day for 10.0 years (10.0 ttl pk-yrs)    Types: Cigarettes    Passive exposure: Past   Smokeless tobacco: Never   Tobacco comments:    2-3 cigarettes daily- 08/06/2022  Vaping Use   Vaping status: Never Used  Substance and Sexual Activity   Alcohol use: Yes    Alcohol/week: 1.0 standard drink of alcohol    Types: 1 Shots of liquor per week    Comment: socially   Drug use: Yes    Types: Codeine   Sexual activity: Yes  Other Topics Concern   Not on file  Social History Narrative   Lives with his uncle    Social Determinants of Health   Financial Resource Strain: Not on file  Food Insecurity: No Food Insecurity (05/01/2022)   Hunger Vital Sign    Worried About Running Out of Food in the Last Year: Never true    Ran Out of Food in the Last Year: Never true  Transportation Needs: No Transportation Needs (05/01/2022)   PRAPARE - Administrator, Civil Service (Medical): No    Lack of Transportation (Non-Medical): No  Physical Activity: Not on file  Stress: Not on file  Social Connections: Unknown (08/05/2022)   Received from [facility]   Social Network    Social Network: Not on file  Intimate Partner Violence: Unknown (08/05/2022)   Received from [facility]   HITS    Physically Hurt: Not on file    Insult or Talk Down To: Not on file    Threaten  Physical Harm: Not on file    Scream or Curse: Not on file    Outpatient Medications Prior to Visit  Medication Sig Dispense Refill   aspirin EC 81 MG tablet Take 1 tablet (81 mg total) by mouth daily. Swallow whole. 90 tablet 3   clopidogrel (PLAVIX) 75 MG tablet Take 1 tablet (75 mg total) by mouth daily. 90 tablet 3   empagliflozin (JARDIANCE) 10 MG TABS tablet Take 10 mg by mouth daily.     Fluticasone-Umeclidin-Vilant (TRELEGY ELLIPTA) 100-62.5-25 MCG/ACT AEPB Inhale 1 puff into the lungs daily. 28 each 0   furosemide (LASIX) 40 MG tablet Take 1 tablet (40 mg total) by mouth daily. 90 tablet 3   gabapentin (NEURONTIN) 100 MG capsule  Take 1 capsule by mouth twice daily 60 capsule 0   losartan (COZAAR) 50 MG tablet Take 1 tablet (50 mg total) by mouth daily. 90 tablet 3   metoprolol succinate (TOPROL-XL) 25 MG 24 hr tablet Take 2 tablets (50 mg total) by mouth daily. Stop taking metoprolol if you continue to use cocaine 180 tablet 3   nitroGLYCERIN (NITROSTAT) 0.4 MG SL tablet Place 1 tablet (0.4 mg total) under the tongue every 5 (five) minutes as needed for chest pain (if chest pain not resolved after 3 doses, please call 911). 25 tablet 2   rosuvastatin (CRESTOR) 40 MG tablet Take 1 tablet (40 mg total) by mouth daily. 90 tablet 3   spironolactone (ALDACTONE) 25 MG tablet Take 1 tablet (25 mg total) by mouth daily. 90 tablet 3   VENTOLIN HFA 108 (90 Base) MCG/ACT inhaler SMARTSIG:1 Via Inhaler Daily     famotidine (PEPCID) 20 MG tablet Take 1 tablet by mouth twice daily 60 tablet 0   pantoprazole (PROTONIX) 40 MG tablet Take 1 tablet (40 mg total) by mouth 2 (two) times daily. 60 tablet 1   nicotine (NICODERM CQ - DOSED IN MG/24 HOURS) 21 mg/24hr patch Place 1 patch (21 mg total) onto the skin daily. (Patient not taking: Reported on 10/20/2022) 28 patch 0   Facility-Administered Medications Prior to Visit  Medication Dose Route Frequency Provider Last Rate Last Admin   0.9 %  sodium  chloride infusion  500 mL Intravenous Continuous Cirigliano, Vito V, DO        Allergies  Allergen Reactions   Bee Pollen     ROS Review of Systems  Constitutional:  Negative for activity change, appetite change, chills, diaphoresis, fatigue, fever and unexpected weight change.  HENT:  Negative for congestion, dental problem, drooling and ear discharge.   Eyes:  Negative for pain, discharge, redness and itching.  Respiratory:  Negative for apnea, cough, choking, chest tightness, shortness of breath and wheezing.   Cardiovascular: Negative.  Negative for chest pain, palpitations and leg swelling.  Gastrointestinal:  Negative for abdominal distention, abdominal pain, anal bleeding, blood in stool, constipation, diarrhea and vomiting.  Endocrine: Negative for polydipsia, polyphagia and polyuria.  Genitourinary:  Negative for difficulty urinating, flank pain, frequency and genital sores.  Musculoskeletal: Negative.  Negative for arthralgias, back pain, gait problem and joint swelling.  Skin:  Positive for rash. Negative for color change and pallor.  Neurological:  Negative for dizziness, facial asymmetry, light-headedness, numbness and headaches.  Psychiatric/Behavioral:  Negative for agitation, behavioral problems, confusion, hallucinations, self-injury, sleep disturbance and suicidal ideas.       Objective:    Physical Exam Vitals and nursing note reviewed. Exam conducted with a chaperone present.  Constitutional:      General: He is not in acute distress.    Appearance: Normal appearance. He is obese. He is not ill-appearing, toxic-appearing or diaphoretic.  HENT:     Mouth/Throat:     Mouth: Mucous membranes are moist.     Pharynx: Oropharynx is clear. No oropharyngeal exudate or posterior oropharyngeal erythema.  Eyes:     General: No scleral icterus.       Right eye: No discharge.        Left eye: No discharge.     Extraocular Movements: Extraocular movements intact.      Conjunctiva/sclera: Conjunctivae normal.  Cardiovascular:     Rate and Rhythm: Normal rate and regular rhythm.     Pulses: Normal pulses.  Heart sounds: Normal heart sounds. No murmur heard.    No friction rub. No gallop.  Pulmonary:     Effort: Pulmonary effort is normal. No respiratory distress.     Breath sounds: Normal breath sounds. No stridor. No wheezing, rhonchi or rales.  Chest:     Chest wall: No tenderness.  Abdominal:     General: There is no distension.     Palpations: Abdomen is soft.     Tenderness: There is no abdominal tenderness. There is no right CVA tenderness, left CVA tenderness or guarding.  Genitourinary:    Penis: Tenderness present.      Comments: Warts noted  in the perianal area Musculoskeletal:        General: No swelling, tenderness, deformity or signs of injury.     Right lower leg: No edema.     Left lower leg: No edema.  Skin:    General: Skin is warm and dry.     Capillary Refill: Capillary refill takes less than 2 seconds.     Coloration: Skin is not jaundiced or pale.     Findings: No bruising, erythema or lesion.     Comments: No rashes noted around the groin on examination but the skin appears mildly erythematous   Warts noted in the anal area, no redness swelling or drainage noted  Small mobile rubbery mass palpated in the scrotum area, reported tenderness on palpation.    Neurological:     Mental Status: He is alert and oriented to person, place, and time.     Motor: No weakness.     Coordination: Coordination normal.     Gait: Gait normal.  Psychiatric:        Mood and Affect: Mood normal.        Behavior: Behavior normal.        Thought Content: Thought content normal.        Judgment: Judgment normal.     BP 119/78 (BP Location: Right Arm, Patient Position: Sitting, Cuff Size: Normal)   Pulse 74   Resp 16   Wt 236 lb (107 kg)   SpO2 97%   BMI 32.01 kg/m  Wt Readings from Last 3 Encounters:  10/20/22 236 lb (107 kg)   08/20/22 237 lb (107.5 kg)  08/19/22 243 lb (110.2 kg)    No results found for: "TSH" Lab Results  Component Value Date   WBC 9.8 06/20/2022   HGB 14.5 06/20/2022   HCT 44.8 06/20/2022   MCV 79.7 (L) 06/20/2022   PLT 363 06/20/2022   Lab Results  Component Value Date   NA 135 06/20/2022   K 4.1 06/20/2022   CO2 20 (L) 06/20/2022   GLUCOSE 172 (H) 06/20/2022   BUN 20 06/20/2022   CREATININE 1.48 (H) 06/20/2022   BILITOT 0.6 06/05/2022   ALKPHOS 90 06/05/2022   AST 24 06/05/2022   ALT 16 06/05/2022   PROT 7.6 06/05/2022   ALBUMIN 3.8 06/05/2022   CALCIUM 9.0 06/20/2022   ANIONGAP 10 06/20/2022   EGFR 48 (L) 03/14/2022   Lab Results  Component Value Date   CHOL 138 05/02/2022   Lab Results  Component Value Date   HDL 26 (L) 05/02/2022   Lab Results  Component Value Date   LDLCALC 46 05/02/2022   Lab Results  Component Value Date   TRIG 331 (H) 05/02/2022   Lab Results  Component Value Date   CHOLHDL 5.3 05/02/2022   Lab Results  Component Value Date  HGBA1C 5.2 04/14/2022      Assessment & Plan:   Problem List Items Addressed This Visit       Cardiovascular and Mediastinum   Hypertension    BP Readings from Last 3 Encounters:  10/20/22 119/78  08/20/22 (!) 103/58  08/19/22 121/85   HTN Controlled .  On metoprolol 50 mg daily, spironolactone 25 mg daily, furosemide 40 mg daily, losartan 50 mg daily Continue current medications. No changes in management. Discussed DASH diet and dietary sodium restrictions Continue to increase dietary efforts and exercise.         CAD (coronary artery disease)    Continue Crestor 40 mg daily, aspirin 81 mg daily, Plavix 75 mg daily Smoking cessation encouraged Lab Results  Component Value Date   CHOL 138 05/02/2022   HDL 26 (L) 05/02/2022   LDLCALC 46 05/02/2022   TRIG 331 (H) 05/02/2022   CHOLHDL 5.3 05/02/2022         Chronic diastolic CHF (congestive heart failure) (HCC) - Primary    BP  Readings from Last 3 Encounters:  10/20/22 119/78  08/20/22 (!) 103/58  08/19/22 121/85  Continue metoprolol 50 mg daily, Jardiance 10 mg daily, spironolactone 25 mg daily, furosemide 40 mg daily DASH diet advised Follow-up with cardiology as planned        Respiratory   Other emphysema (HCC)   Relevant Medications   nicotine (NICODERM CQ - DOSED IN MG/24 HOURS) 21 mg/24hr patch     Digestive   GERD (gastroesophageal reflux disease)    Continue pantoprazole 40 mg daily  Take famotidine 20 mg at bedtime PRN       Relevant Medications   pantoprazole (PROTONIX) 40 MG tablet   famotidine (PEPCID) 20 MG tablet     Musculoskeletal and Integument   Rash and other nonspecific skin eruption    C/o itchy rashes around the groin, No rashes noted around the groin on examination but the skin appears mildly ery erythematous  Nystatin powder ordered Apply to the affected areas in the groin area  3 times daily       Relevant Medications   nystatin (MYCOSTATIN/NYSTOP) powder   Genital warts     - Ambulatory referral to Dermatology - imiquimod (ALDARA) 5 % cream; Apply topically 3 (three) times a week.  Dispense: 12 each; Refill: 0 Patient told to apply at bedtime elevate the skin for 6 to 10 hours Wash off in the morning with mild soap and water He Is interested in cryotherapy, patient referred to dermatology       Relevant Medications   imiquimod (ALDARA) 5 % cream   nystatin (MYCOSTATIN/NYSTOP) powder   Other Relevant Orders   Ambulatory referral to Dermatology     Genitourinary   Stage 3a chronic kidney disease (HCC)    Lab Results  Component Value Date   NA 135 06/20/2022   K 4.1 06/20/2022   CO2 20 (L) 06/20/2022   GLUCOSE 172 (H) 06/20/2022   BUN 20 06/20/2022   CREATININE 1.48 (H) 06/20/2022   CALCIUM 9.0 06/20/2022   EGFR 48 (L) 03/14/2022   GFRNONAA 58 (L) 06/20/2022  On Jardiance 10 mg daily Stated that  he saw the nephrologist once at  [facility] Patient encouraged to maintain close follow-up with them Avoid NSAIDs and other nephrotoxic agents Bmp today       Relevant Orders   Basic Metabolic Panel     Other   Tobacco use    Smokes about  less than 1 pack/day  Asked about quitting: confirms that he/she currently smokes cigarettes Advise to quit smoking: Educated about QUITTING to reduce the risk of cancer, cardio and cerebrovascular disease. Assess willingness: willing to quit at this time,  Assist with counseling and pharmacotherapy: Counseled for 5 minutes and literature provided. Arrange for follow up: follow up in 2 months and continue to offer help.   - nicotine (NICODERM CQ - DOSED IN MG/24 HOURS) 21 mg/24hr patch; Place 1 patch (21 mg total) onto the skin daily.  Dispense: 28 patch; Refill: 0 Follow-up in 2 months      Relevant Medications   nicotine (NICODERM CQ - DOSED IN MG/24 HOURS) 21 mg/24hr patch   Hyperlipidemia    Continue Crestor 40 mg daily LDL currently well-controlled Lab Results  Component Value Date   CHOL 138 05/02/2022   HDL 26 (L) 05/02/2022   LDLCALC 46 05/02/2022   TRIG 331 (H) 05/02/2022   CHOLHDL 5.3 05/02/2022         Obesity with body mass index (BMI) of 30.0 to 39.9    Wt Readings from Last 3 Encounters:  10/20/22 236 lb (107 kg)  08/20/22 237 lb (107.5 kg)  08/19/22 243 lb (110.2 kg)   Body mass index is 32.01 kg/m.  Counseled on low-carb modified diet Encouraged to engage in regular moderate exercises at least 150 minutes weekly      Scrotal cyst    Will refer patient to dermatology      Relevant Orders   Ambulatory referral to Dermatology    Meds ordered this encounter  Medications   pantoprazole (PROTONIX) 40 MG tablet    Sig: Take 1 tablet (40 mg total) by mouth daily.    Dispense:  90 tablet    Refill:  1   DISCONTD: nicotine (NICODERM CQ - DOSED IN MG/24 HOURS) 21 mg/24hr patch    Sig: Place 1 patch (21 mg total) onto the skin daily.     Dispense:  28 patch    Refill:  0   imiquimod (ALDARA) 5 % cream    Sig: Apply topically 3 (three) times a week.    Dispense:  12 each    Refill:  0   DISCONTD: triamcinolone (KENALOG) 0.025 % ointment    Sig: Apply 1 Application topically 2 (two) times daily.    Dispense:  30 g    Refill:  0   nicotine (NICODERM CQ - DOSED IN MG/24 HOURS) 21 mg/24hr patch    Sig: Place 1 patch (21 mg total) onto the skin daily.    Dispense:  28 patch    Refill:  1   DISCONTD: nystatin (MYCOSTATIN/NYSTOP) powder    Sig: Apply 1 Application topically 3 (three) times daily.    Dispense:  15 g    Refill:  0   nystatin (MYCOSTATIN/NYSTOP) powder    Sig: Apply 1 Application topically 3 (three) times daily.    Dispense:  15 g    Refill:  0    Please do not fill the prescription for triamcinolone ointment   famotidine (PEPCID) 20 MG tablet    Sig: Take 1 tablet (20 mg total) by mouth at bedtime as needed for heartburn or indigestion.    Dispense:  60 tablet    Refill:  0    Follow-up: Return in about 2 months (around 12/20/2022).    Donell Beers, FNP

## 2022-10-20 NOTE — Patient Instructions (Signed)
1. Chronic diastolic CHF (congestive heart failure) (HCC)  2. Stage 3a chronic kidney disease (HCC)  - Basic Metabolic Panel  3. Mixed hyperlipidemia   4. Other emphysema (HCC)   5. Gastroesophageal reflux disease, unspecified whether esophagitis present  - pantoprazole (PROTONIX) 40 MG tablet; Take 1 tablet (40 mg total) by mouth daily.  Dispense: 90 tablet; Refill: 1  6. Genital warts  - Ambulatory referral to Dermatology - imiquimod (ALDARA) 5 % cream; Apply topically 3 (three) times a week.  Dispense: 12 each; Refill: 0  7. Tobacco use  - nicotine (NICODERM CQ - DOSED IN MG/24 HOURS) 21 mg/24hr patch; Place 1 patch (21 mg total) onto the skin daily.  Dispense: 28 patch; Refill: 0  8. Rash and other nonspecific skin eruption  - triamcinolone (KENALOG) 0.025 % ointment; Apply 1 Application topically 2 (two) times daily.  Dispense: 30 g; Refill: 0    It is important that you exercise regularly at least 30 minutes 5 times a week as tolerated  Think about what you will eat, plan ahead. Choose " clean, green, fresh or frozen" over canned, processed or packaged foods which are more sugary, salty and fatty. 70 to 75% of food eaten should be vegetables and fruit. Three meals at set times with snacks allowed between meals, but they must be fruit or vegetables. Aim to eat over a 12 hour period , example 7 am to 7 pm, and STOP after  your last meal of the day. Drink water,generally about 64 ounces per day, no other drink is as healthy. Fruit juice is best enjoyed in a healthy way, by EATING the fruit.  Thanks for choosing [facility] we consider it a privelige to serve you.

## 2022-10-20 NOTE — Progress Notes (Signed)
.  Pt presents for hypertension f/u   -need refill on Pantoprazole

## 2022-10-20 NOTE — Assessment & Plan Note (Signed)
Smokes about less than 1 pack/day  Asked about quitting: confirms that he/she currently smokes cigarettes Advise to quit smoking: Educated about QUITTING to reduce the risk of cancer, cardio and cerebrovascular disease. Assess willingness: willing to quit at this time,  Assist with counseling and pharmacotherapy: Counseled for 5 minutes and literature provided. Arrange for follow up: follow up in 2 months and continue to offer help.   - nicotine (NICODERM CQ - DOSED IN MG/24 HOURS) 21 mg/24hr patch; Place 1 patch (21 mg total) onto the skin daily.  Dispense: 28 patch; Refill: 0 Follow-up in 2 months

## 2022-10-20 NOTE — Assessment & Plan Note (Signed)
Continue Crestor 40 mg daily, aspirin 81 mg daily, Plavix 75 mg daily Smoking cessation encouraged Lab Results  Component Value Date   CHOL 138 05/02/2022   HDL 26 (L) 05/02/2022   LDLCALC 46 05/02/2022   TRIG 331 (H) 05/02/2022   CHOLHDL 5.3 05/02/2022

## 2022-10-20 NOTE — Assessment & Plan Note (Addendum)
Will refer patient to dermatology

## 2022-10-20 NOTE — Assessment & Plan Note (Signed)
-   Ambulatory referral to Dermatology - imiquimod (ALDARA) 5 % cream; Apply topically 3 (three) times a week.  Dispense: 12 each; Refill: 0 Patient told to apply at bedtime elevate the skin for 6 to 10 hours Wash off in the morning with mild soap and water He Is interested in cryotherapy, patient referred to dermatology

## 2022-10-20 NOTE — Assessment & Plan Note (Addendum)
C/o itchy rashes around the groin, No rashes noted around the groin on examination but the skin appears mildly ery erythematous  Nystatin powder ordered Apply to the affected areas in the groin area  3 times daily

## 2022-10-20 NOTE — Assessment & Plan Note (Signed)
Continue Crestor 40 mg daily LDL currently well-controlled Lab Results  Component Value Date   CHOL 138 05/02/2022   HDL 26 (L) 05/02/2022   LDLCALC 46 05/02/2022   TRIG 331 (H) 05/02/2022   CHOLHDL 5.3 05/02/2022

## 2022-10-20 NOTE — Assessment & Plan Note (Signed)
Wt Readings from Last 3 Encounters:  10/20/22 236 lb (107 kg)  08/20/22 237 lb (107.5 kg)  08/19/22 243 lb (110.2 kg)   Body mass index is 32.01 kg/m.  Counseled on low-carb modified diet Encouraged to engage in regular moderate exercises at least 150 minutes weekly

## 2022-10-20 NOTE — Assessment & Plan Note (Signed)
BP Readings from Last 3 Encounters:  10/20/22 119/78  08/20/22 (!) 103/58  08/19/22 121/85  Continue metoprolol 50 mg daily, Jardiance 10 mg daily, spironolactone 25 mg daily, furosemide 40 mg daily DASH diet advised Follow-up with cardiology as planned

## 2022-10-20 NOTE — Assessment & Plan Note (Signed)
BP Readings from Last 3 Encounters:  10/20/22 119/78  08/20/22 (!) 103/58  08/19/22 121/85   HTN Controlled .  On metoprolol 50 mg daily, spironolactone 25 mg daily, furosemide 40 mg daily, losartan 50 mg daily Continue current medications. No changes in management. Discussed DASH diet and dietary sodium restrictions Continue to increase dietary efforts and exercise.

## 2022-10-20 NOTE — Assessment & Plan Note (Signed)
Continue pantoprazole 40 mg daily  Take famotidine 20 mg at bedtime PRN

## 2022-10-20 NOTE — Assessment & Plan Note (Signed)
Lab Results  Component Value Date   NA 135 06/20/2022   K 4.1 06/20/2022   CO2 20 (L) 06/20/2022   GLUCOSE 172 (H) 06/20/2022   BUN 20 06/20/2022   CREATININE 1.48 (H) 06/20/2022   CALCIUM 9.0 06/20/2022   EGFR 48 (L) 03/14/2022   GFRNONAA 58 (L) 06/20/2022  On Jardiance 10 mg daily Stated that  he saw the nephrologist once at  [facility] Patient encouraged to maintain close follow-up with them Avoid NSAIDs and other nephrotoxic agents Bmp today

## 2022-10-21 LAB — BASIC METABOLIC PANEL
BUN/Creatinine Ratio: 8 — ABNORMAL LOW (ref 9–20)
BUN: 12 mg/dL (ref 6–24)
CO2: 21 mmol/L (ref 20–29)
Calcium: 9.7 mg/dL (ref 8.7–10.2)
Chloride: 101 mmol/L (ref 96–106)
Creatinine, Ser: 1.43 mg/dL — ABNORMAL HIGH (ref 0.76–1.27)
Glucose: 82 mg/dL (ref 70–99)
Potassium: 3.9 mmol/L (ref 3.5–5.2)
Sodium: 138 mmol/L (ref 134–144)
eGFR: 60 mL/min/{1.73_m2} (ref >59)

## 2022-10-22 ENCOUNTER — Ambulatory Visit: Payer: Medicaid Other | Attending: Internal Medicine | Admitting: Internal Medicine

## 2022-10-22 VITALS — BP 113/72 | HR 79 | Ht 72.0 in | Wt 243.0 lb

## 2022-10-22 DIAGNOSIS — N183 Chronic kidney disease, stage 3 unspecified: Secondary | ICD-10-CM | POA: Diagnosis not present

## 2022-10-22 DIAGNOSIS — Z951 Presence of aortocoronary bypass graft: Secondary | ICD-10-CM | POA: Diagnosis not present

## 2022-10-22 DIAGNOSIS — G4733 Obstructive sleep apnea (adult) (pediatric): Secondary | ICD-10-CM

## 2022-10-22 DIAGNOSIS — Z720 Tobacco use: Secondary | ICD-10-CM

## 2022-10-22 DIAGNOSIS — I5032 Chronic diastolic (congestive) heart failure: Secondary | ICD-10-CM | POA: Diagnosis not present

## 2022-10-22 DIAGNOSIS — I2510 Atherosclerotic heart disease of native coronary artery without angina pectoris: Secondary | ICD-10-CM

## 2022-10-22 NOTE — Patient Instructions (Signed)
Your physician has requested that you have an echocardiogram. Echocardiography is a painless test that uses sound waves to create images of your heart. It provides your doctor with information about the size and shape of your heart and how well your heart's chambers and valves are working. This procedure takes approximately one hour. There are no restrictions for this procedure. Please do NOT wear cologne, perfume, aftershave, or lotions (deodorant is allowed). Please arrive 15 minutes prior to your appointment time.   We will call you to schedule you ECHO.  [phone]

## 2022-10-22 NOTE — Progress Notes (Signed)
Advanced Heart Failure Clinic Note   Referring Physician: Dr. Azucena Cecil PCP: Donell Beers, FNP PCP-Cardiologist: Debbe Odea, MD   HPI:  Logan Crawford is a 47 y.o. male with HTN, polysubstance abuse, tobacco use disorder, CAD s/p PCI in 04/2021, CABG in 12/23.   Hospitalized from 9/23 with NSTEMI. Cath on 9/25 showing significant two-vessel coronary artery disease which was  treated medically and on DAPT. Readmitted 12/23 with NSTEMI.  Cath 02/13/22 which showed recurrent in-stent restenosis in the LAD in the setting of continued cocaine use and poor compliance with antiplatelet medications.  Patient also has significant progression of the RCA disease.  Pre-op EF normal.   Underwent CAG 02/19/22  (LIMA - Diag, SVG - LAD, SVG - RCA). Operative course complicated by severe hyperkalemia (prior to cardiopulmonary bypass with question of malignant hyperthermia syndrome - received dantrolene). Early post-op course was notable for shock physiology and AKI. Required IABP and multiple gtts which were eventually weaned ECHO EF 55-60%. Also had some mild sternal drainage treated with abx Discharged 03/01/22.   I saw him last month and was volume overloaded with large left pleural effusion. Underwent tap with IR on 03/10/22 with 750cc out. Started on Furoscix  Saw General Cards 1/22 with concern for recurrent effusion. Stat CXR with trace effusion.   Admitted in 3/24 with CP. Felt to have pericarditis. Had mild volume overload. Echo 05/01/22  EF 55%  Seen earlier this week in Pulmonary Clinic and weight up 5 pounds so referred back top HF Clinic to reassess  Sleep study 5/24  showed severe  OSA with AHI 52/ hr  Here for f/u. No longer going to [facility]. Using Bipap. Sleeping better. Says breathing is better. No CP. Mild DOE, orthopnea or PND.    Past Medical History:  Diagnosis Date   CAD (coronary artery disease)    a. 11/2019 NSTEMI/PCI: LM min irregs, LAD 63m (2.75x22 Resolute Onyx DES), 40d,  D2 min irregs, LCX/LPAV min irregs, RCA 60ost, 40p/m, EF 45-50%; b. 11/2020 MV: EF 43%, small area of apical ischemia ->? over processing-->low risk.   Hyperlipidemia    Hypertension    Ischemic cardiomyopathy    a. 11/2019 LV Gram: EF 45-50%; b. 12/2019 Echo: EF 50% w/ mod mid-apical anteroseptal HK, GrI DD, nl RV fxn, mild Ao sclerosis.   Tobacco use 11/25/2019    Current Outpatient Medications  Medication Sig Dispense Refill   aspirin EC 81 MG tablet Take 1 tablet (81 mg total) by mouth daily. Swallow whole. 90 tablet 3   clopidogrel (PLAVIX) 75 MG tablet Take 1 tablet (75 mg total) by mouth daily. 90 tablet 3   empagliflozin (JARDIANCE) 10 MG TABS tablet Take 10 mg by mouth daily.     Fluticasone-Umeclidin-Vilant (TRELEGY ELLIPTA) 100-62.5-25 MCG/ACT AEPB Inhale 1 puff into the lungs daily. 28 each 0   furosemide (LASIX) 40 MG tablet Take 1 tablet (40 mg total) by mouth daily. 90 tablet 3   gabapentin (NEURONTIN) 100 MG capsule Take 1 capsule by mouth twice daily 60 capsule 0   imiquimod (ALDARA) 5 % cream Apply topically 3 (three) times a week. 12 each 0   losartan (COZAAR) 50 MG tablet Take 1 tablet (50 mg total) by mouth daily. 90 tablet 3   metoprolol succinate (TOPROL-XL) 25 MG 24 hr tablet Take 2 tablets (50 mg total) by mouth daily. Stop taking metoprolol if you continue to use cocaine 180 tablet 3   nystatin (MYCOSTATIN/NYSTOP) powder Apply 1 Application topically 3 (three)  times daily. 15 g 0   pantoprazole (PROTONIX) 40 MG tablet Take 1 tablet (40 mg total) by mouth daily. 90 tablet 1   rosuvastatin (CRESTOR) 40 MG tablet Take 1 tablet (40 mg total) by mouth daily. 90 tablet 3   spironolactone (ALDACTONE) 25 MG tablet Take 1 tablet (25 mg total) by mouth daily. 90 tablet 3   VENTOLIN HFA 108 (90 Base) MCG/ACT inhaler SMARTSIG:1 Via Inhaler Daily     famotidine (PEPCID) 20 MG tablet Take 1 tablet (20 mg total) by mouth at bedtime as needed for heartburn or indigestion. (Patient  not taking: Reported on 10/22/2022) 60 tablet 0   nicotine (NICODERM CQ - DOSED IN MG/24 HOURS) 21 mg/24hr patch Place 1 patch (21 mg total) onto the skin daily. (Patient not taking: Reported on 10/22/2022) 28 patch 1   nitroGLYCERIN (NITROSTAT) 0.4 MG SL tablet Place 1 tablet (0.4 mg total) under the tongue every 5 (five) minutes as needed for chest pain (if chest pain not resolved after 3 doses, please call 911). (Patient not taking: Reported on 10/22/2022) 25 tablet 2   Current Facility-Administered Medications  Medication Dose Route Frequency Provider Last Rate Last Admin   0.9 %  sodium chloride infusion  500 mL Intravenous Continuous Cirigliano, Vito V, DO        Allergies  Allergen Reactions   Bee Pollen       Social History   Socioeconomic History   Marital status: Single    Spouse name: Not on file   Number of children: 3   Years of education: Not on file   Highest education level: Not on file  Occupational History   Not on file  Tobacco Use   Smoking status: Every Day    Current packs/day: 1.00    Average packs/day: 1 pack/day for 10.0 years (10.0 ttl pk-yrs)    Types: Cigarettes    Passive exposure: Past   Smokeless tobacco: Never   Tobacco comments:    2-3 cigarettes daily- 08/06/2022  Vaping Use   Vaping status: Never Used  Substance and Sexual Activity   Alcohol use: Yes    Alcohol/week: 1.0 standard drink of alcohol    Types: 1 Shots of liquor per week    Comment: socially   Drug use: Yes    Types: Codeine   Sexual activity: Yes  Other Topics Concern   Not on file  Social History Narrative   Lives with his uncle    Social Determinants of Health   Financial Resource Strain: Not on file  Food Insecurity: No Food Insecurity (05/01/2022)   Hunger Vital Sign    Worried About Running Out of Food in the Last Year: Never true    Ran Out of Food in the Last Year: Never true  Transportation Needs: No Transportation Needs (05/01/2022)   PRAPARE - Therapist, art (Medical): No    Lack of Transportation (Non-Medical): No  Physical Activity: Not on file  Stress: Not on file  Social Connections: Unknown (08/05/2022)   Received from [facility]   Social Network    Social Network: Not on file  Intimate Partner Violence: Unknown (08/05/2022)   Received from [facility]   HITS    Physically Hurt: Not on file    Insult or Talk Down To: Not on file    Threaten Physical Harm: Not on file    Scream or Curse: Not on file      Family History  Problem Relation Age of Onset   Coronary artery disease Mother    Arrhythmia Mother        Pacemaker placed in 2021   Heart failure Mother    Breast cancer Mother    Coronary artery disease Father    Colon cancer Father    Colon polyps Father     Vitals:   10/22/22 1443  BP: 113/72  Pulse: 79  SpO2: 98%  Weight: 243 lb (110.2 kg)  Height: 6' (1.829 m)     Wt Readings from Last 3 Encounters:  10/22/22 243 lb (110.2 kg)  10/20/22 236 lb (107 kg)  08/20/22 237 lb (107.5 kg)     PHYSICAL EXAM: General:  Well appearing. No resp difficulty HEENT: normal Neck: supple. no JVD. Carotids 2+ bilat; no bruits. No lymphadenopathy or thryomegaly appreciated. Cor: PMI nondisplaced. Regular rate & rhythm. No rubs, gallops or murmurs. Lungs: clear Abdomen: obese soft, nontender, nondistended. No hepatosplenomegaly. No bruits or masses. Good bowel sounds. Extremities: no cyanosis, clubbing, rash, edema Neuro: alert & orientedx3, cranial nerves grossly intact. moves all 4 extremities w/o difficulty. Affect pleasant  ASSESSMENT & PLAN:  1. Chronic diastolic HF - S/p CABG 12/27.  - Echo 12/29 with EF 50-55%, mild AI, mild MR.  - Bedside echo 03/07/22 in clinic LVEF 60% RV ok. No pericardial effusion - Echo 05/01/22 EF 55% - Improved NYHA I-II - Volume status ok - Continue Toprol 50 mg daily - Continue losartan 25 mg daily  - Continue spironolactone 25 mg daily.  - Continue  Jardiance10 mg daily.     2. CAD with NSTEMI - Hx prior PCI/stent to LAD in 2021 and PCI/stent to LAD in 03/23 d/t in-stent restenosis - NSTEMI 12/23 S/p CABG X 3 on 02/19/22 (LIMA to Diag, SVG to LAD, SVG to RCA) - No s/s angina Continue DAPT   3. CKD 3a -Baseline Scr 1.3-1.6 - Recent labs 10/20/22 Scr 1.4   4. Polysubstance abuse -Tobacco and cocaine - Reports he is off cocaine - Still smoking ~ 1/2 ppd - Discussed need for cessation   5. Peripheral neuropathy - Continue gabapentin  6. OSA, severe - Split study  showed severe  OSA with AHI 52/ hr  - Seeing Dr. Vassie Loll. On Bipap  7. COPD - per Pulmonary.  - PFTs with mild obstruction   8. Obesity - Body mass index is 32.96 kg/m. - Can consider GLP1RA as needed  Logan Meres, MD 10/22/22

## 2022-11-10 ENCOUNTER — Ambulatory Visit: Payer: Medicaid Other | Admitting: Student in an Organized Health Care Education/Training Program

## 2022-11-10 ENCOUNTER — Encounter: Payer: Medicaid Other | Admitting: Internal Medicine

## 2022-12-10 ENCOUNTER — Ambulatory Visit: Payer: Medicaid Other | Admitting: Urology

## 2022-12-10 ENCOUNTER — Encounter: Payer: Self-pay | Admitting: Urology

## 2022-12-10 VITALS — BP 128/86 | HR 92 | Ht 72.0 in | Wt 235.0 lb

## 2022-12-10 DIAGNOSIS — S3130XA Unspecified open wound of scrotum and testes, initial encounter: Secondary | ICD-10-CM

## 2022-12-10 DIAGNOSIS — L723 Sebaceous cyst: Secondary | ICD-10-CM

## 2022-12-10 MED ORDER — SULFAMETHOXAZOLE-TRIMETHOPRIM 800-160 MG PO TABS: 1.0000 | ORAL_TABLET | Freq: Two times a day (BID) | ORAL | 0 refills | Status: DC

## 2022-12-10 NOTE — Patient Instructions (Signed)
Epidermoid Cyst  An epidermoid cyst, also called an epidermal cyst, is a small lump under your skin. The cyst contains a substance called keratin. Do not try to pop or open the cyst yourself. What are the causes? A blocked hair follicle. A hair that curls and re-enters the skin instead of growing straight out of the skin. A blocked pore. Irritated skin. An injury to the skin. Certain conditions that are passed along from parent to child. Human papillomavirus (HPV). This happens rarely when cysts occur on the bottom of the feet. Long-term sun damage to the skin. What increases the risk? Having acne. Being male. Having an injury to the skin. Being past puberty. Having certain conditions caused by genes (genetic disorder) What are the signs or symptoms? These cysts are usually harmless, but they can get infected. Symptoms of infection may include: Redness. Inflammation. Tenderness. Warmth. Fever. A bad-smelling substance that drains from the cyst. Pus that drains from the cyst. How is this treated? In many cases, epidermoid cysts go away on their own without treatment. If a cyst becomes infected, treatment may include: Opening and draining the cyst, done by a doctor. After draining, you may need minor surgery to remove the rest of the cyst. Antibiotic medicine. Shots of medicines (steroids) that help to reduce inflammation. Surgery to remove the cyst. Surgery may be done if the cyst: Becomes large. Bothers you. Has a chance of turning into cancer. Do not try to open a cyst yourself. Follow these instructions at home: Medicines Take over-the-counter and prescription medicines as told by your doctor. If you were prescribed an antibiotic medicine, take it as told by your doctor. Do not stop taking it even if you start to feel better. General instructions Keep the area around your cyst clean and dry. Wear loose, dry clothing. Avoid touching your cyst. Check your cyst every day  for signs of infection. Check for: Redness, swelling, or pain. Fluid or blood. Warmth. Pus or a bad smell. Keep all follow-up visits. How is this prevented? Wear clean, dry, clothing. Avoid wearing tight clothing. Keep your skin clean and dry. Take showers or baths every day. Contact a doctor if: Your cyst has symptoms of infection. Your condition does not improve or gets worse. You have a cyst that looks different from other cysts you have had. You have a fever. Get help right away if: Redness spreads from the cyst into the area close by. Summary An epidermoid cyst is a small lump under your skin. If a cyst becomes infected, treatment may include surgery to open and drain the cyst, or to remove it. Take over-the-counter and prescription medicines only as told by your doctor. Contact a doctor if your condition is not improving or is getting worse. Keep all follow-up visits. This information is not intended to replace advice given to you by your health care provider. Make sure you discuss any questions you have with your health care provider. Document Revised: 05/17/2019 Document Reviewed: 05/18/2019 Elsevier Patient Education  2024 [employer].

## 2022-12-10 NOTE — Progress Notes (Signed)
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  He denies any urinary symptoms today.  He reports a lesion in the bottom of the scrotum at the base of the penis that has been painful and irritated, he reportedly put a pin in this and got some purulent drainage.  He denies any fevers or chills, thinks things have improved.  On exam there is a 2 cm spherical lesion at the base of the scrotum and penis with a healing wound from prior area of drainage, this may represent a small abscess or more likely a sebaceous cyst.  No evidence of crepitus or spreading erythema.  I recommended a short course of Bactrim and warm compresses, return precautions were discussed extensively, hesitant to attempt I&D today with his minimal symptoms and improvement over the last week and anticoagulation.  RTC 2 weeks symptom check  Sondra Come, MD  [facility] [address] [place], [place] [address] [phone]

## 2022-12-22 ENCOUNTER — Ambulatory Visit: Payer: Self-pay | Admitting: Nurse Practitioner

## 2022-12-24 IMAGING — DX DG CHEST 1V PORT
1 series · 1 of 1 positions shown · non-contrast
Comparison: 08/06/2020; 01/23/2020

CLINICAL DATA: CAD.

EXAM:
PORTABLE CHEST 1 VIEW

[chest ap]
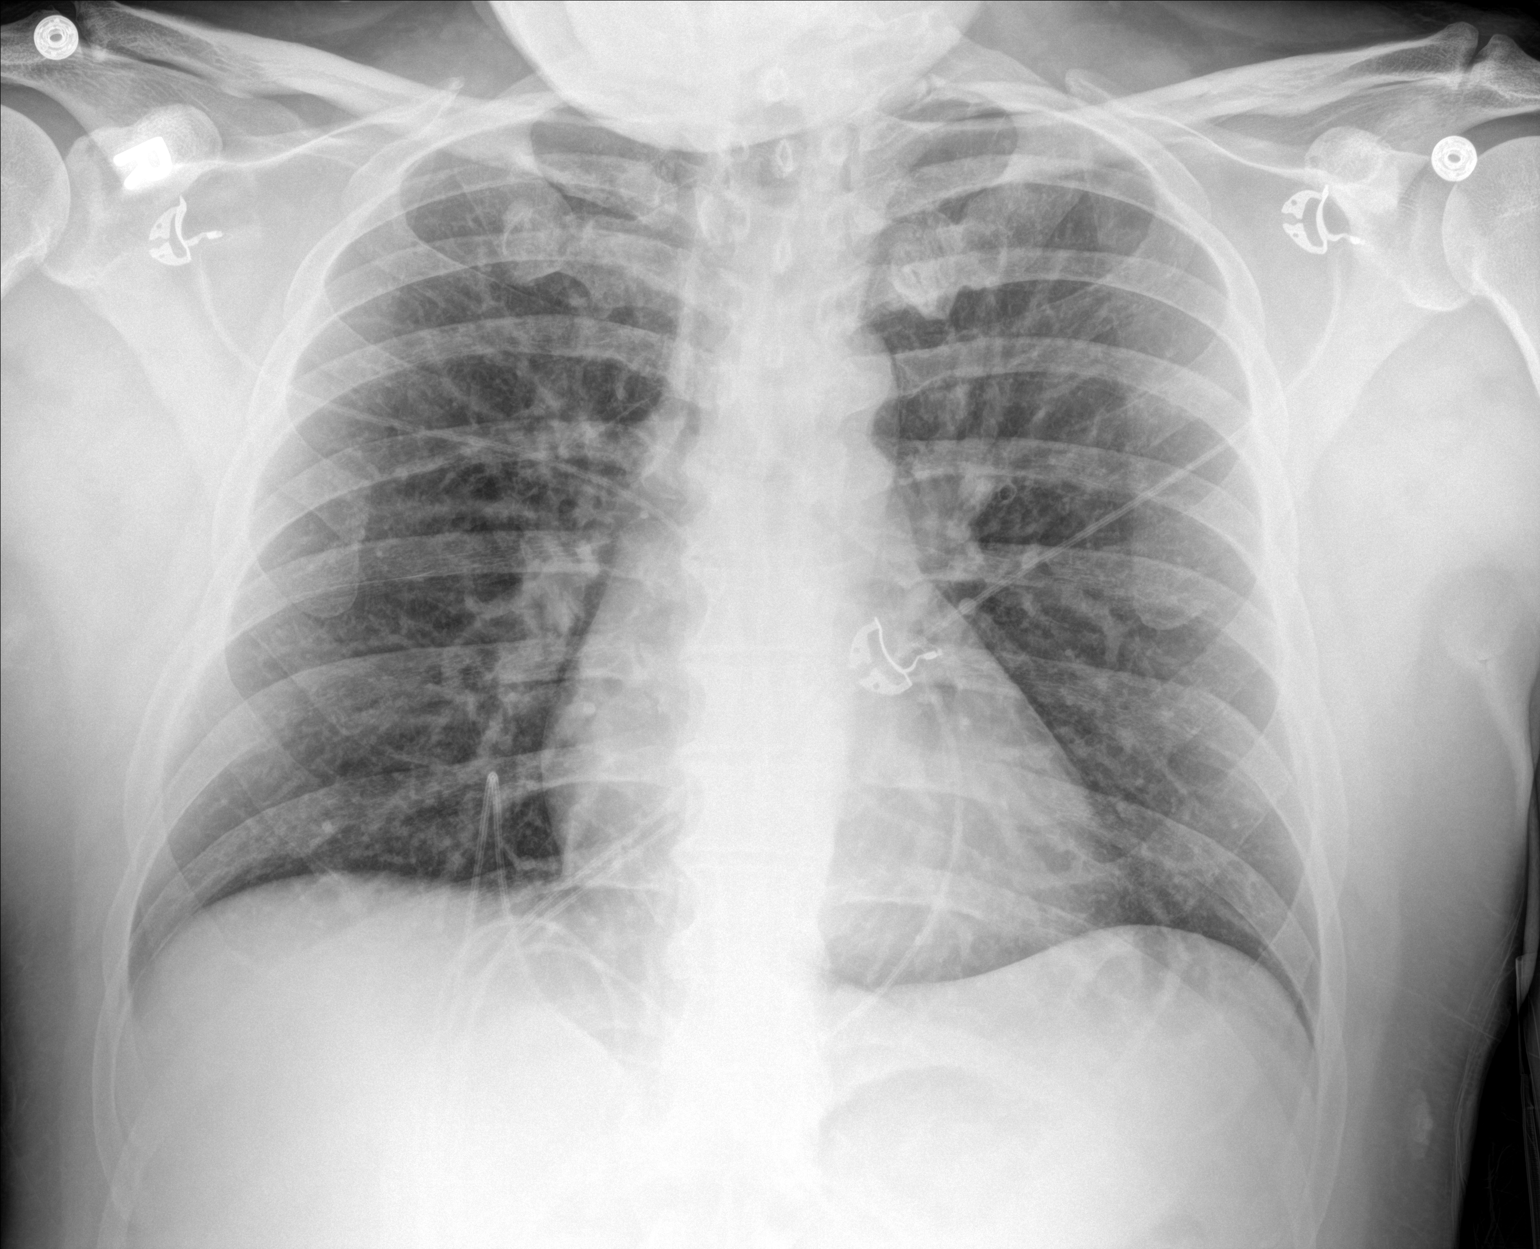

[1 of 1 positions shown; findings below may reference images not displayed]

FINDINGS: Suspected enlargement of the cardiac silhouette. Normal mediastinal
contours. No focal parenchymal opacities. No pleural effusion or
pneumothorax no evidence of edema. No acute osseous abnormalities.
IMPRESSION: 1. Suspected enlargement of the cardiac silhouette, potentially
artifactual due to AP projection, though could be seen in the
setting cardiomegaly and/or a pericardial effusion. Clinical
correlation is advised. Further evaluation with cardiac echo could
be performed as indicated.
2. Otherwise, no acute cardiopulmonary disease.

## 2023-01-13 ENCOUNTER — Encounter: Payer: Self-pay | Admitting: Urology

## 2023-01-13 ENCOUNTER — Ambulatory Visit: Payer: Medicaid Other | Admitting: Urology

## 2023-01-13 VITALS — BP 128/84 | HR 90 | Ht 72.0 in | Wt 232.0 lb

## 2023-01-13 DIAGNOSIS — N509 Disorder of male genital organs, unspecified: Secondary | ICD-10-CM

## 2023-01-13 NOTE — Patient Instructions (Signed)
Epidermoid Cyst Removal Epidermoid cyst removal is a procedure to remove a fluid-filled sac that forms under your skin (epidermoid cyst). This type of cyst is filled with a thick, oily substance (keratin) that is secreted by your skin glands. Epidermoid cysts may also be called epidermal cysts, or keratin cysts. Normally, the skin secretes this pasty material through a gland or a hair follicle. However, when a skin gland or hair follicle becomes blocked, an epidermoid cyst can form. You may need this procedure if you have an epidermal cyst that becomes large, uncomfortable, or inflamed. Tell a health care provider about: Any allergies you have. All medicines you are taking, including vitamins, herbs, eye drops, creams, and over-the-counter medicines. Any problems you or family members have had with anesthetic medicines. Any blood disorders you have. Any surgeries you have had. Any medical conditions you have now or have had. Whether you are pregnant or may be pregnant. What are the risks? Generally, this is a safe procedure. However, problems may occur, including: Recurrence of the cyst. Bleeding. Infection. Scarring. What happens before the procedure? Ask your health care provider about: Changing or stopping your regular medicines. This is especially important if you are taking diabetes medicines or blood thinners. Taking medicines such as aspirin and ibuprofen. These medicines can thin your blood. Do not take these medicines unless your health care provider tells you to take them. Taking over-the-counter medicines, vitamins, herbs, and supplements. If you have an inflamed or infected cyst, you may have to take antibiotic medicine before the cyst removal. Take your antibiotic as told by your health care provider. Do not stop taking the antibiotic even if you start to feel better. Take a shower on the morning of your procedure. Your health care provider may ask you to use a germ-killing  soap. What happens during the procedure?  You will be given a medicine to numb the area (local anesthetic). The skin around the cyst will be cleaned with a germ-killing solution. The health care provider will make a small incision in your skin over the cyst. The health care provider will separate the cyst from the surrounding tissues that are under your skin. If possible, the cyst will be removed undamaged (intact). If the cyst bursts (ruptures), it will be removed in pieces. After the cyst is removed, the health care provider will control any bleeding and close the incision with small stitches (sutures). Small incisions may not need sutures, and the bleeding will be controlled by applying direct pressure with gauze. The health care provider may apply antibiotic ointment and a bandage (dressing) over the incision. The procedure may vary among health care providers and hospitals. What happens after the procedure? If you are prescribed an antibiotic medicine or ointment, take or apply it as told by your health care provider. Do not stop using the antibiotic even if you start to feel better. Summary Epidermoid cyst removal is a procedure to remove a sac that has formed under your skin. You may need this procedure if you have an epidermoid cyst that becomes large, uncomfortable, or inflamed. The health care provider will make a small incision in your skin to remove the cyst. If you are prescribed an antibiotic medicine before the procedure, after the procedure, or both, use the antibiotic as told by your health care provider. Do not stop using the antibiotic even if you start to feel better. This information is not intended to replace advice given to you by your health care provider. Make sure  you discuss any questions you have with your health care provider. Document Revised: 05/17/2019 Document Reviewed: 05/18/2019 Elsevier Patient Education  2024 [employer] Inc[employer]  Epidermoid Cyst Removal, Care  After This sheet gives you information about how to care for yourself after your procedure. Your health care provider may also give you more specific instructions. If you have problems or questions, contact your health care provider. What can I expect after the procedure? After the procedure, it is common to have: Soreness in the area where your cyst was removed. Tightness or itchiness from the stitches (sutures) in your skin. Follow these instructions at home: Medicines Take over-the-counter and prescription medicines only as told by your health care provider. If you were prescribed an antibiotic medicine or ointment, take or apply it as told by your health care provider. Do not stop using the antibiotic even if you start to feel better. Incision care  Follow instructions from your health care provider about how to take care of your incision. Make sure you: Wash your hands with soap and water for at least 20 seconds before you change your bandage (dressing). If soap and water are not available, use hand sanitizer. Change your dressing as told by your health care provider. Leave sutures, skin glue, or adhesive strips in place. These skin closures may need to stay in place for 1-2 weeks or longer. If adhesive strip edges start to loosen and curl up, you may trim the loose edges. Do not remove adhesive strips completely unless your health care provider tells you to do that. Keep the dressing dry until your health care provider says that it can be removed. After your dressing is off, check your incision area every day for signs of infection. Check for: Redness, swelling, or pain. Fluid or blood. Warmth. Pus or a bad smell. General instructions Do not take baths, swim, or use a hot tub until your health care provider approves. Ask your health care provider if you may take showers. You may only be allowed to take sponge baths. Your health care provider may ask you to avoid contact sports or  activities that take a lot of effort. Do not do anything that stretches or puts pressure on your incision. You can return to your normal diet. Keep all follow-up visits. This is important. Contact a health care provider if: You have a fever. You have redness, swelling, or pain in the incision area. You have fluid or blood coming from your incision. You have pus or a bad smell coming from your incision. Your incision feels warm to the touch. Your cyst grows back. Get help right away if: If the incision site suddenly increases in size and you have pain at the incision site. You may be checked for a collection of blood under the skin from the procedure (hematoma). Summary After the procedure, it is common to have soreness in the area where your cyst was removed. Take or apply over-the-counter and prescription medicines only as told by your health care provider. Follow instructions from your health care provider about how to take care of your incision. This information is not intended to replace advice given to you by your health care provider. Make sure you discuss any questions you have with your health care provider. Document Revised: 05/17/2019 Document Reviewed: 05/18/2019 Elsevier Patient Education  2024 [employer].

## 2023-01-13 NOTE — Progress Notes (Signed)
   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  When I saw him in October 2024 he reported a lesion at the base of the penis that has been painful and irritated, and he was intermittently self draining this with a pin.  On exam this was consistent with a sebaceous cyst with infection, and I recommended a course of Bactrim and warm compresses.  He reports persistent discomfort despite the course of antibiotics.  On exam there is a 2 cm lesion at the base of the penis that is most consistent with a sebaceous cyst, no evidence of purulent drainage or erythema to indicate active infection.  He is strongly interested in surgical removal.  Risks and benefits were discussed at length.  Schedule excision of scrotal sebaceous cyst  Sondra Come, MD  [facility] [address], [address] [place], [place] [address] [phone]

## 2023-01-14 ENCOUNTER — Other Ambulatory Visit: Payer: Self-pay

## 2023-01-14 DIAGNOSIS — N509 Disorder of male genital organs, unspecified: Secondary | ICD-10-CM

## 2023-01-14 NOTE — Progress Notes (Unsigned)
Surgical Physician Order Form [facility] [place]  Dr. Legrand Rams, MD  * Scheduling expectation : Next Available  *Length of Case: 30 minutes  *Clearance needed: no  *Anticoagulation Instructions: Hold all anticoagulants  *Aspirin Instructions: Hold Aspirin  *Post-op visit Date/Instructions:  tbd  *Diagnosis: Scrotal lesion  *Procedure: Excision of scrotal lesion, sebaceous cyst   Additional orders: N/A  -Admit type: OUTpatient  -Anesthesia: General  -VTE Prophylaxis Standing Order SCD's       Other:   -Standing Lab Orders Per Anesthesia    Lab other: None  -Standing Test orders EKG/Chest x-ray per Anesthesia       Test other:   - Medications:  Ancef 2gm IV  -Other orders:  N/A

## 2023-01-15 ENCOUNTER — Telehealth: Payer: Self-pay

## 2023-01-15 ENCOUNTER — Telehealth: Payer: Self-pay | Admitting: *Deleted

## 2023-01-15 ENCOUNTER — Other Ambulatory Visit: Payer: Self-pay | Admitting: Nurse Practitioner

## 2023-01-15 DIAGNOSIS — A630 Anogenital (venereal) warts: Secondary | ICD-10-CM

## 2023-01-15 DIAGNOSIS — R21 Rash and other nonspecific skin eruption: Secondary | ICD-10-CM

## 2023-01-15 NOTE — Progress Notes (Signed)
  Phone Number: [phone] for Surgical Coordinator Fax Number: [phone]  REQUEST FOR SURGICAL CLEARANCE       Date: 01/15/2023  Faxed to: Dr. Gala Romney, MD  Surgeon: Dr. Legrand Rams, MD     Date of Surgery: 01/30/2023  Operation: Excision of Scrotal Lesion, Sebaceous Cyst Removal   Anesthesia Type: General   Diagnosis: Scrotal Lesion  Patient Requires:   Cardiac / Vascular Clearance : Yes  Reason: Would like for patient to hold Plavix and ASA prior to surgery   Risk Assessment:    Low   []       Moderate   []     High   []           This patient is optimized for surgery  YES []       NO   []    I recommend further assessment/workup prior to surgery. YES []      NO  []   Appointment scheduled for: _______________________   Further recommendations: ____________________________________     Physician Signature:__________________________________   Printed Name: ________________________________________   Date: _________________

## 2023-01-15 NOTE — Telephone Encounter (Signed)
         Pre-operative Risk Assessment    Patient Name: Logan Crawford  DOB: 1975/08/13 MRN: [id number]  DATE OF LAST VISIT: 10/22/22 DR. Gala Romney DATE OF NEXT VISIT: 02/02/23 DR. AGBOR-ETANG    Request for Surgical Clearance    Procedure:   Excision of Scrotal Lesion, Sebaceous Cyst Removal   Date of Surgery:  Clearance 01/30/23                                 Surgeon:  DR. Richardo Hanks Surgeon's Group or Practice Name:  [facility] Phone number:   [phone] Fax number:  [phone]   Type of Clearance Requested:   - Medical  - Pharmacy:  Hold Aspirin and Clopidogrel (Plavix)     Type of Anesthesia:  General    Additional requests/questions:    Elpidio Anis   01/15/2023, 5:28 PM      Phone Number: [phone] for Surgical Coordinator Fax Number: [phone]   REQUEST FOR SURGICAL CLEARANCE                                           Date: 01/15/2023   Faxed to: Dr. Gala Romney, MD   Surgeon: Dr. Legrand Rams, MD             Date of Surgery: 01/30/2023   Operation: Excision of Scrotal Lesion, Sebaceous Cyst Removal    Anesthesia Type: General    Diagnosis: Scrotal Lesion   Patient Requires:    Cardiac / Vascular Clearance : Yes   Reason: Would like for patient to hold Plavix and ASA prior to surgery     Risk Assessment:     Low   []       Moderate   []     High   []                 This patient is optimized for surgery  YES []       NO   []      I recommend further assessment/workup prior to surgery. YES []      NO  []    Appointment scheduled for: _______________________    Further recommendations: ____________________________________        Physician Signature:__________________________________    Printed Name: ________________________________________    Date: _________________            Electronically signed by Letta Kocher, CMA at 01/15/2023  5:26

## 2023-01-15 NOTE — Telephone Encounter (Signed)
-----   Message from [facility] Melissa H sent at 01/15/2023  5:25 PM EST ----- Surgical Clearance

## 2023-01-15 NOTE — Telephone Encounter (Signed)
Per Dr. Richardo Hanks, Patient is to be scheduled for Excision of Scrotal Lesion, Sebaceous Cyst Removal   [name] was contacted and possible surgical dates were discussed, Friday December 6th, 2024 was agreed upon for surgery.   Patient was directed to call [phone] between 1-3pm the day before surgery to find out surgical arrival time.  Instructions were given not to eat or drink from midnight on the night before surgery and have a driver for the day of surgery. On the surgery day patient was instructed to enter through the [facility] entrance of [facility] report the Same Day Surgery desk.   Pre-Admit Testing will be in contact via phone to set up an interview with the anesthesia team to review your history and medications prior to surgery.   Reminder of this information was sent via MyChart to the patient.   Patient is to hold anticoag's per Dr. Richardo Hanks.  Currently taking Plavix by Dr. Gala Romney. Clearance requested to stop 5days prior to surgery, clearance form was faxed to [facility]. Awaiting response

## 2023-01-15 NOTE — Progress Notes (Signed)
   [facility] Surgical Posting Form  Surgery Date: Date: 01/30/2023  Surgeon: Dr. Legrand Rams, MD  Inpt ( No  )   Outpt (Yes)   Obs ( No  )   Diagnosis: N50.9 Scrotal Lesion  -CPT: 55100, 55110  Surgery: Excision of Scrotal Lesion, Sebaceous Cyst Removal  Stop Anticoagulations: Yes and also hold ASA  Cardiac/Medical/Pulmonary Clearance needed: yes  Clearance needed from Dr: Gala Romney, MD  Clearance request sent on: Date: 01/15/23  *Orders entered into EPIC  Date: 01/15/23   *Case booked in EPIC  Date: 01/15/23  *Notified pt of Surgery: Date: 01/15/23  PRE-OP UA & CX: no  *Placed into Prior Authorization Work [place] Date: 01/15/23  Assistant/laser/rep:No

## 2023-01-19 NOTE — Telephone Encounter (Signed)
   Name: Logan Crawford  DOB: 1975-05-28  MRN: [id number]  Primary Cardiologist: Debbe Odea, MD  Chart reviewed as part of pre-operative protocol coverage. Because of [name]'s past medical history and time since last visit, he will require a follow-up in-office visit in order to better assess preoperative cardiovascular risk.  Pre-op covering staff: - Please schedule appointment and call patient to inform them. If patient already had an upcoming appointment within acceptable timeframe, please add "pre-op clearance" to the appointment notes so provider is aware. - Please contact requesting surgeon's office via preferred method (i.e, phone, fax) to inform them of need for appointment prior to surgery.  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  Remains on dual antiplatelet therapy for this reason.  Would prefer to continue aspirin throughout and can hold Plavix for 5 days prior to procedure.  Sharlene Dory, PA-C  01/19/2023, 8:44 AM

## 2023-01-19 NOTE — Telephone Encounter (Signed)
Pt has sooner appt 01/21/23 with Carlos Levering, NP. I will update all parties involved.

## 2023-01-19 NOTE — Telephone Encounter (Signed)
I will check with the [place] office to see if we can get in sooner due to pre op clearance needed and med hold.   Tried to call the Logan Crawford to offer sooner appt, but vm is not set up-, could not leave message. I will send a note to requesting office that we are trying to get the Logan Crawford in sooner for pre op clearance so he can hold his medications in time for procedure. Logan Crawford needs to call back and schedule sooner in office appt.   See notes from Jari Favre, [facility] as well.

## 2023-01-21 ENCOUNTER — Ambulatory Visit: Payer: Medicaid Other | Attending: Student | Admitting: Student

## 2023-01-21 ENCOUNTER — Encounter: Payer: Self-pay | Admitting: Student

## 2023-01-21 VITALS — BP 135/88 | HR 81 | Ht 72.0 in | Wt 230.6 lb

## 2023-01-21 DIAGNOSIS — Z951 Presence of aortocoronary bypass graft: Secondary | ICD-10-CM

## 2023-01-21 DIAGNOSIS — I10 Essential (primary) hypertension: Secondary | ICD-10-CM

## 2023-01-21 DIAGNOSIS — R079 Chest pain, unspecified: Secondary | ICD-10-CM

## 2023-01-21 DIAGNOSIS — Z01810 Encounter for preprocedural cardiovascular examination: Secondary | ICD-10-CM

## 2023-01-21 DIAGNOSIS — I5032 Chronic diastolic (congestive) heart failure: Secondary | ICD-10-CM

## 2023-01-21 DIAGNOSIS — R9431 Abnormal electrocardiogram [ECG] [EKG]: Secondary | ICD-10-CM | POA: Diagnosis not present

## 2023-01-21 DIAGNOSIS — R0609 Other forms of dyspnea: Secondary | ICD-10-CM

## 2023-01-21 DIAGNOSIS — I25810 Atherosclerosis of coronary artery bypass graft(s) without angina pectoris: Secondary | ICD-10-CM

## 2023-01-21 DIAGNOSIS — E785 Hyperlipidemia, unspecified: Secondary | ICD-10-CM

## 2023-01-21 NOTE — Progress Notes (Signed)
Cardiology Clinic Note   Date: 01/21/2023 ID: Akash, Hebdon [birth year], MRN [id number]  Primary Cardiologist:  Debbe Odea, MD  Patient Profile    Logan Crawford is a 47 y.o. male who presents to the clinic today for pre-op evaluation.     Past medical history significant for: CAD. LHC 01/24/2020 (NSTEMI): Ostial to proximal RCA 60%.  Proximal to mid RCA 40%.  Mid LAD 90%.  PCI with DES 2.75 x 22 mm to mid LAD.  Mildly reduced LV systolic function with EF 45% with moderate to severe anteroapical hypokinesis.  Moderately elevated LVEDP. LHC 05/20/2021 (NSTEMI): Distal LAD 40%.  Proximal to mid RCA 40%.  Ostial proximal RCA 50%.  RPDA 40%.  Ostial LM 30%.  Mid LAD 85% distal edge in-stent restenosis with extensive plaque distal to the stent involving the whole mid segment.  D2 60%.  PCI with DES 2.75 x 34 mm to mid LAD.  Balloon angioplasty to ostial D2. LHC 11/18/2021 (unstable angina): Ostial 30%.  Distal LAD 40%.  Ostial to proximal RCA 50%.  Proximal mid RCA 40%.  D2 30%.  RPDA 40%.  Mid LAD #1 80%, #3 60%, mid RCA 70%.  Patent overlapping LAD stents with evidence of significant in-stent restenosis in the proximal segment with borderline stenosis distal to the stent.  Recommended CABG versus balloon angioplasty given patient's poor compliance with medical therapy and continued cocaine use. LHC 02/13/2022: Proximal and distal LAD stent: Proximal to mid LAD 70%, jailed D2 80%.  Mid LAD #1 80%, #2 50%, distal LAD had distal stent edge 99%.  Mid RCA #1 70%, #2 95%.  RVEF 60%.  RPDA 60%.  Recommend transfer to [facility] for CTS consult. CABG x 3 02/19/2022: LIMA to diagonal, SVG to LAD, SVG to RCA.  LAA closure. Chronic diastolic heart failure. Echo 05/01/2022: EF 55%.  Hypokinesis of distal anteroseptal and periapical region.  Moderate LVH.  Grade I DD.  Normal RV size/function.  Aortic valve sclerosis/calcification without stenosis. Hypertension. Hyperlipidemia. Lipid panel  05/14/2022: LDL 46, HDL 26, TG 331, total 138. GERD. Emphysema. OSA.  CKD stage IIIa. Tobacco abuse. Polysubstance abuse. Medication nonadherence.  In summary, patient has an extensive cardiac history with multiple PCI and CABG as detailed.  He was admitted to the hospital in October 2021 with progressive weakness, fatigue, dyspnea, cough, diarrhea.  He was found to be COVID-positive.  Troponin peaked at 32.  Echo showed EF 50 to 55%, no RWMA, Grade I DD, normal RV size and function, no valvular abnormalities.  He was readmitted November 2021 with NSTEMI, troponin peaked at 3520.  Echo at that time showed EF 50%, moderate hypokinesis of the mid apical anteroseptal wall, mild LVH, diastolic dysfunction, normal RV size/function, mild aortic valve sclerosis without stenosis.  He underwent successful PCI with DES to mid LAD.  Lexiscan October 2022 showed a small region of ischemia in the apical wall MI is overall low risk.    He was admitted to the hospital March 2023 with chest pain, troponin peaked at 125.  UDS positive for cocaine.  Echo showed normal LV/RV function, hypokinesis of the mid to distal anteroseptal/septal and apical region, moderate LVH, Grade I DD, mild MR, aortic valve sclerosis without stenosis.  LHC demonstrated in-stent restenosis as detailed above patient underwent PCI with DES and balloon angioplasty.    He was admitted September 2023 with unstable angina.  He had not been taking his medications.  UDS positive for cocaine.  Echo showed  EF 60 to 65%, no RWMA, moderate concentric LVH, Grade I DD, normal RV size/function, trivial MR, aortic valve sclerosis without stenosis.  LHC showed significant in-stent restenosis in the proximal segment of overlapping LAD stents.  IC NTG improved overall appearance of coronary arteries and intervention was deferred secondary to patient's poor compliance and continued cocaine use.  Antianginal therapy was optimized with plan for revascularization if  patient remains symptomatic.  He was admitted to the hospital December 2023 with NSTEMI in the setting of continued cocaine use and nonadherence with antiplatelet therapy.  Echo showed normal LV/RV function, no RWMA, mild MR.  LHC showed 2 potential culprit lesion including mid to distal RCA and mid to distal LAD.  He was transferred to [facility] and underwent CABG x 3.  Intraoperative course complicated by hyperkalemia bradycardic arrest prior to initiation of cardiopulmonary bypass with requirement of IABP as well as inotropic and vasopressor support which was continued postoperatively.  Posthospitalization course complicated by left pleural effusion requiring thoracentesis.     History of Present Illness    Logan Crawford is followed by Dr. Azucena Cecil and advanced heart failure clinic for the above outlined history.   Patient was last seen in the office by Dr. Azucena Cecil on 08/01/2022 for routine follow-up.  He was participating in cardiac rehab.  He reported dyspnea with overexertion.  He was euvolemic at that time.  No medication changes were made.  Patient was last seen by Dr. Gala Romney on 10/22/2022 for routine follow-up.  He is going to cardiac rehab at the time of his visit.  He was euvolemic at that time with no complaints.  No medication changes were made.  Today, patient presents for pre-op evaluation. He is pending excision of scrotal sebaceous cyst. He reports occasional chest pain that he is not sure if it is reflux. He typically feels it at night. No chest pain with exertion. He has been more dyspneic with less exertion in the last couple of months. He reports he realized today that he has not been taking metoprolol and losartan for 2 months secondary to it being moved off the counter. He restarted losartan but wanted to make sure he should still take metoprolol. He reports he has been taking all his other medications. He has not been using BiPAP recently as well. He denies  palpitations, lower extremity edema, orthopnea or PND. He admits to using cocaine 1 time a month ago. He stays active by playing pool. He has not been to the gym in some time.        ROS: All other systems reviewed and are otherwise negative except as noted in History of Present Illness.  Studies Reviewed    EKG Interpretation Date/Time:  Wednesday January 21 2023 15:17:45 EST Ventricular Rate:  81 PR Interval:  160 QRS Duration:  70 QT Interval:  404 QTC Calculation: 469 R Axis:   30  Text Interpretation: Normal sinus rhythm Inferior infarct (cited on or before 20-Jun-2022) Possible Anterior infarct (cited on or before 20-Jun-2022) ST & T wave abnormality, consider lateral ischemia When compared with ECG of 20-Jun-2022 11:30, More prominent T-Wave inversion in lateral leads Confirmed by Carlos Levering [phone]) on 01/21/2023 3:33:58 PM           Physical Exam    VS:  BP 135/88 (BP Location: Left Arm, Patient Position: Sitting, Cuff Size: Normal)   Pulse 81   Ht 6' (1.829 m)   Wt 230 lb 9.6 oz (104.6 kg)  SpO2 98%   BMI 31.27 kg/m  , BMI Body mass index is 31.27 kg/m.  GEN: Well nourished, well developed, in no acute distress. Neck: No JVD or carotid bruits. Cardiac:  RRR. No murmurs. No rubs or gallops.   Respiratory:  Respirations regular and unlabored. Clear to auscultation without rales, wheezing or rhonchi. GI: Soft, nontender, nondistended. Extremities: Radials/DP/PT 2+ and equal bilaterally. No clubbing or cyanosis. No edema.  Skin: Warm and dry, no rash. Neuro: Strength intact.  Assessment & Plan   CAD/EKG changes Extensive history with multiple PCI. CABG x 26 January 2022. Patient reports nighttime chest pain that he attributes to reflux. He has been more dyspneic with less activity recently. He reports not taking losartan and metoprolol for the last 2 months as it got moved from the counter accidentally. EKG today shows more prominent T-Wave inversion in  lateral leads when compared to prior EKG.  Eugenie Birks for further evaluation.   -Restart Toprol.  -Continue aspirin, Plavix, rosuvastatin, as needed SL NTG. -BMP today.   Chronic diastolic heart failure Echo March 2024 showed EF 55%, hypokinesis of distal anteroseptal and periapical region, moderate LVH, Grade I DD.  Patient denies lower extremity edema, orthopnea or PND. He reports more dyspnea with less activity.  Euvolemic and well compensated on exam. -Restart Toprol and losartan.  -Continue Jardiance, furosemide, spironolactone.  Hypertension BP today 135/88.  -Continue losartan, Toprol, spironolactone.  Hyperlipidemia LDL March 2024 46, at goal. -Continue rosuvastatin.  Preoperative cardiovascular risk assessment Patient is pending excision of scrotal sebaceous cyst on 01/30/2023 with Dr. Richardo Hanks. According to the RCRI, patient has a 6.6% risk of MACE. Patient reports nighttime chest pain that he attributes to reflux and increased dyspnea with less exertion. EKG shows more prominent inverted T-waves in lateral leads concerning for possible ischemia.  -Patient cannot be cleared until evaluated with stress test.  -Patient was instructed to NOT stop Plavix and aspirin until after he has stress test. He understands this may push back his surgery.  -Will send recommendations to surgeon.   Disposition: BMP today. Lexiscan. Return in 2 months or sooner as needed.      Informed Consent   Shared Decision Making/Informed Consent The risks [chest pain, shortness of breath, cardiac arrhythmias, dizziness, blood pressure fluctuations, myocardial infarction, stroke/transient ischemic attack, nausea, vomiting, allergic reaction, radiation exposure, metallic taste sensation and life-threatening complications (estimated to be 1 in 10,000)], benefits (risk stratification, diagnosing coronary artery disease, treatment guidance) and alternatives of a nuclear stress test were discussed in detail with  Mr. Condray and he agrees to proceed.      Signed, Etta Grandchild. Raequon Catanzaro, DNP, NP-C

## 2023-01-21 NOTE — H&P (View-Only) (Signed)
 Cardiology Clinic Note   Date: 01/21/2023 ID: Logan Crawford, Logan Crawford [birth year], MRN [id number]  Primary Cardiologist:  Debbe Odea, MD  Patient Profile    Logan Crawford is a 47 y.o. male who presents to the clinic today for pre-op evaluation.     Past medical history significant for: CAD. LHC 01/24/2020 (NSTEMI): Ostial to proximal RCA 60%.  Proximal to mid RCA 40%.  Mid LAD 90%.  PCI with DES 2.75 x 22 mm to mid LAD.  Mildly reduced LV systolic function with EF 45% with moderate to severe anteroapical hypokinesis.  Moderately elevated LVEDP. LHC 05/20/2021 (NSTEMI): Distal LAD 40%.  Proximal to mid RCA 40%.  Ostial proximal RCA 50%.  RPDA 40%.  Ostial LM 30%.  Mid LAD 85% distal edge in-stent restenosis with extensive plaque distal to the stent involving the whole mid segment.  D2 60%.  PCI with DES 2.75 x 34 mm to mid LAD.  Balloon angioplasty to ostial D2. LHC 11/18/2021 (unstable angina): Ostial 30%.  Distal LAD 40%.  Ostial to proximal RCA 50%.  Proximal mid RCA 40%.  D2 30%.  RPDA 40%.  Mid LAD #1 80%, #3 60%, mid RCA 70%.  Patent overlapping LAD stents with evidence of significant in-stent restenosis in the proximal segment with borderline stenosis distal to the stent.  Recommended CABG versus balloon angioplasty given patient's poor compliance with medical therapy and continued cocaine use. LHC 02/13/2022: Proximal and distal LAD stent: Proximal to mid LAD 70%, jailed D2 80%.  Mid LAD #1 80%, #2 50%, distal LAD had distal stent edge 99%.  Mid RCA #1 70%, #2 95%.  RVEF 60%.  RPDA 60%.  Recommend transfer to [facility] for CTS consult. CABG x 3 02/19/2022: LIMA to diagonal, SVG to LAD, SVG to RCA.  LAA closure. Chronic diastolic heart failure. Echo 05/01/2022: EF 55%.  Hypokinesis of distal anteroseptal and periapical region.  Moderate LVH.  Grade I DD.  Normal RV size/function.  Aortic valve sclerosis/calcification without stenosis. Hypertension. Hyperlipidemia. Lipid panel  05/14/2022: LDL 46, HDL 26, TG 331, total 138. GERD. Emphysema. OSA.  CKD stage IIIa. Tobacco abuse. Polysubstance abuse. Medication nonadherence.  In summary, patient has an extensive cardiac history with multiple PCI and CABG as detailed.  He was admitted to the hospital in October 2021 with progressive weakness, fatigue, dyspnea, cough, diarrhea.  He was found to be COVID-positive.  Troponin peaked at 32.  Echo showed EF 50 to 55%, no RWMA, Grade I DD, normal RV size and function, no valvular abnormalities.  He was readmitted November 2021 with NSTEMI, troponin peaked at 3520.  Echo at that time showed EF 50%, moderate hypokinesis of the mid apical anteroseptal wall, mild LVH, diastolic dysfunction, normal RV size/function, mild aortic valve sclerosis without stenosis.  He underwent successful PCI with DES to mid LAD.  Lexiscan October 2022 showed a small region of ischemia in the apical wall MI is overall low risk.    He was admitted to the hospital March 2023 with chest pain, troponin peaked at 125.  UDS positive for cocaine.  Echo showed normal LV/RV function, hypokinesis of the mid to distal anteroseptal/septal and apical region, moderate LVH, Grade I DD, mild MR, aortic valve sclerosis without stenosis.  LHC demonstrated in-stent restenosis as detailed above patient underwent PCI with DES and balloon angioplasty.    He was admitted September 2023 with unstable angina.  He had not been taking his medications.  UDS positive for cocaine.  Echo showed  EF 60 to 65%, no RWMA, moderate concentric LVH, Grade I DD, normal RV size/function, trivial MR, aortic valve sclerosis without stenosis.  LHC showed significant in-stent restenosis in the proximal segment of overlapping LAD stents.  IC NTG improved overall appearance of coronary arteries and intervention was deferred secondary to patient's poor compliance and continued cocaine use.  Antianginal therapy was optimized with plan for revascularization if  patient remains symptomatic.  He was admitted to the hospital December 2023 with NSTEMI in the setting of continued cocaine use and nonadherence with antiplatelet therapy.  Echo showed normal LV/RV function, no RWMA, mild MR.  LHC showed 2 potential culprit lesion including mid to distal RCA and mid to distal LAD.  He was transferred to [facility] and underwent CABG x 3.  Intraoperative course complicated by hyperkalemia bradycardic arrest prior to initiation of cardiopulmonary bypass with requirement of IABP as well as inotropic and vasopressor support which was continued postoperatively.  Posthospitalization course complicated by left pleural effusion requiring thoracentesis.     History of Present Illness    Logan Crawford is followed by Dr. Azucena Crawford and advanced heart failure clinic for the above outlined history.   Patient was last seen in the office by Dr. Azucena Crawford on 08/01/2022 for routine follow-up.  He was participating in cardiac rehab.  He reported dyspnea with overexertion.  He was euvolemic at that time.  No medication changes were made.  Patient was last seen by Dr. Gala Crawford on 10/22/2022 for routine follow-up.  He is going to cardiac rehab at the time of his visit.  He was euvolemic at that time with no complaints.  No medication changes were made.  Today, patient presents for pre-op evaluation. He is pending excision of scrotal sebaceous cyst. He reports occasional chest pain that he is not sure if it is reflux. He typically feels it at night. No chest pain with exertion. He has been more dyspneic with less exertion in the last couple of months. He reports he realized today that he has not been taking metoprolol and losartan for 2 months secondary to it being moved off the counter. He restarted losartan but wanted to make sure he should still take metoprolol. He reports he has been taking all his other medications. He has not been using BiPAP recently as well. He denies  palpitations, lower extremity edema, orthopnea or PND. He admits to using cocaine 1 time a month ago. He stays active by playing pool. He has not been to the gym in some time.        ROS: All other systems reviewed and are otherwise negative except as noted in History of Present Illness.  Studies Reviewed    EKG Interpretation Date/Time:  Wednesday January 21 2023 15:17:45 EST Ventricular Rate:  81 PR Interval:  160 QRS Duration:  70 QT Interval:  404 QTC Calculation: 469 R Axis:   30  Text Interpretation: Normal sinus rhythm Inferior infarct (cited on or before 20-Jun-2022) Possible Anterior infarct (cited on or before 20-Jun-2022) ST & T wave abnormality, consider lateral ischemia When compared with ECG of 20-Jun-2022 11:30, More prominent T-Wave inversion in lateral leads Confirmed by Logan Crawford [phone]) on 01/21/2023 3:33:58 PM           Physical Exam    VS:  BP 135/88 (BP Location: Left Arm, Patient Position: Sitting, Cuff Size: Normal)   Pulse 81   Ht 6' (1.829 m)   Wt 230 lb 9.6 oz (104.6 kg)  SpO2 98%   BMI 31.27 kg/m  , BMI Body mass index is 31.27 kg/m.  GEN: Well nourished, well developed, in no acute distress. Neck: No JVD or carotid bruits. Cardiac:  RRR. No murmurs. No rubs or gallops.   Respiratory:  Respirations regular and unlabored. Clear to auscultation without rales, wheezing or rhonchi. GI: Soft, nontender, nondistended. Extremities: Radials/DP/PT 2+ and equal bilaterally. No clubbing or cyanosis. No edema.  Skin: Warm and dry, no rash. Neuro: Strength intact.  Assessment & Plan   CAD/EKG changes Extensive history with multiple PCI. CABG x 26 January 2022. Patient reports nighttime chest pain that he attributes to reflux. He has been more dyspneic with less activity recently. He reports not taking losartan and metoprolol for the last 2 months as it got moved from the counter accidentally. EKG today shows more prominent T-Wave inversion in  lateral leads when compared to prior EKG.  Logan Crawford for further evaluation.   -Restart Toprol.  -Continue aspirin, Plavix, rosuvastatin, as needed SL NTG. -BMP today.   Chronic diastolic heart failure Echo March 2024 showed EF 55%, hypokinesis of distal anteroseptal and periapical region, moderate LVH, Grade I DD.  Patient denies lower extremity edema, orthopnea or PND. He reports more dyspnea with less activity.  Euvolemic and well compensated on exam. -Restart Toprol and losartan.  -Continue Jardiance, furosemide, spironolactone.  Hypertension BP today 135/88.  -Continue losartan, Toprol, spironolactone.  Hyperlipidemia LDL March 2024 46, at goal. -Continue rosuvastatin.  Preoperative cardiovascular risk assessment Patient is pending excision of scrotal sebaceous cyst on 01/30/2023 with Dr. Richardo Crawford. According to the RCRI, patient has a 6.6% risk of MACE. Patient reports nighttime chest pain that he attributes to reflux and increased dyspnea with less exertion. EKG shows more prominent inverted T-waves in lateral leads concerning for possible ischemia.  -Patient cannot be cleared until evaluated with stress test.  -Patient was instructed to NOT stop Plavix and aspirin until after he has stress test. He understands this may push back his surgery.  -Will send recommendations to surgeon.   Disposition: BMP today. Lexiscan. Return in 2 months or sooner as needed.      Informed Consent   Shared Decision Making/Informed Consent The risks [chest pain, shortness of breath, cardiac arrhythmias, dizziness, blood pressure fluctuations, myocardial infarction, stroke/transient ischemic attack, nausea, vomiting, allergic reaction, radiation exposure, metallic taste sensation and life-threatening complications (estimated to be 1 in 10,000)], benefits (risk stratification, diagnosing coronary artery disease, treatment guidance) and alternatives of a nuclear stress test were discussed in detail with  Logan Crawford and he agrees to proceed.      Signed, Logan Crawford. Logan Catanzaro, DNP, NP-C

## 2023-01-21 NOTE — Patient Instructions (Signed)
Medication Instructions:   - No changes *If you need a refill on your cardiac medications before your next appointment, please call your pharmacy*  Lab Work: Your provider would like for you to have following labs drawn today BMET.   If you have labs (blood work) drawn today and your tests are completely normal, you will receive your results only by: MyChart Message (if you have MyChart) OR A paper copy in the mail If you have any lab test that is abnormal or we need to change your treatment, we will call you to review the results.  Testing/Procedures: [facility] MYOVIEW  Your Provider has ordered a Stress Test with nuclear imaging. The purpose of this test is to evaluate the blood supply to your heart muscle. This procedure is referred to as a "Non-Invasive Stress Test." This is because other than having an IV started in your vein, nothing is inserted or "invades" your body. Cardiac stress tests are done to find areas of poor blood flow to the heart by determining the extent of coronary artery disease (CAD). Some patients exercise on a treadmill, which naturally increases the blood flow to your heart, while others who are unable to walk on a treadmill due to physical limitations have a pharmacologic/chemical stress agent called Lexiscan. This medicine will mimic walking on a treadmill by temporarily increasing your coronary blood flow.     REPORT TO [facility] MEDICAL MALL ENTRANCE  **Proceed to the 1st desk on the right, REGISTRATION, to check in**  Please note: this test may take anywhere between 2-4 hours to complete   Instructions regarding medication:   ___:   You may take all of your regular morning medications the day of your test unless listed below.   How to prepare for your Myoview test:  Do not eat or drink for 6 hours prior to the test No caffeine for 24 hours prior to the test No smoking 24 hours prior to the test. Ladies, please do not wear dresses.  Skirts or pants are  appropriate. Please wear a short sleeve shirt. No perfume, cologne or lotion. Wear comfortable walking shoes. No heels!   PLEASE NOTIFY THE OFFICE AT LEAST 24 HOURS IN ADVANCE IF YOU ARE UNABLE TO KEEP YOUR APPOINTMENT.  [phone] AND  PLEASE NOTIFY NUCLEAR MEDICINE AT [facility] AT LEAST 24 HOURS IN ADVANCE IF YOU ARE UNABLE TO KEEP YOUR APPOINTMENT. [phone]   Follow-Up: At [facility], you and your health needs are our priority.  As part of our continuing mission to provide you with exceptional heart care, we have created designated Provider Care Teams.  These Care Teams include your primary Cardiologist (physician) and Advanced Practice Providers (APPs -  Physician Assistants and Nurse Practitioners) who all work together to provide you with the care you need, when you need it.  Your next appointment:   2 month(s)  Provider:   Carlos Levering, NP

## 2023-01-22 LAB — BASIC METABOLIC PANEL
BUN/Creatinine Ratio: 7 — ABNORMAL LOW (ref 9–20)
BUN: 10 mg/dL (ref 6–24)
CO2: 24 mmol/L (ref 20–29)
Calcium: 9.9 mg/dL (ref 8.7–10.2)
Chloride: 100 mmol/L (ref 96–106)
Creatinine, Ser: 1.41 mg/dL — ABNORMAL HIGH (ref 0.76–1.27)
Glucose: 80 mg/dL (ref 70–99)
Potassium: 4.7 mmol/L (ref 3.5–5.2)
Sodium: 138 mmol/L (ref 134–144)
eGFR: 61 mL/min/{1.73_m2} (ref >59)

## 2023-01-26 ENCOUNTER — Other Ambulatory Visit: Payer: Self-pay

## 2023-01-26 ENCOUNTER — Encounter
Admission: RE | Admit: 2023-01-26 | Discharge: 2023-01-26 | Disposition: A | Discharge status: Home / Self Care | Payer: Medicaid Other | Source: Ambulatory Visit | Attending: Urology | Admitting: Urology

## 2023-01-26 VITALS — Ht 72.0 in | Wt 230.0 lb

## 2023-01-26 DIAGNOSIS — I25118 Atherosclerotic heart disease of native coronary artery with other forms of angina pectoris: Secondary | ICD-10-CM

## 2023-01-26 DIAGNOSIS — F1410 Cocaine abuse, uncomplicated: Secondary | ICD-10-CM

## 2023-01-26 DIAGNOSIS — I10 Essential (primary) hypertension: Secondary | ICD-10-CM

## 2023-01-26 DIAGNOSIS — Z01812 Encounter for preprocedural laboratory examination: Secondary | ICD-10-CM

## 2023-01-26 DIAGNOSIS — F1910 Other psychoactive substance abuse, uncomplicated: Secondary | ICD-10-CM

## 2023-01-26 DIAGNOSIS — I214 Non-ST elevation (NSTEMI) myocardial infarction: Secondary | ICD-10-CM

## 2023-01-26 DIAGNOSIS — E785 Hyperlipidemia, unspecified: Secondary | ICD-10-CM

## 2023-01-26 HISTORY — DX: Chronic diastolic (congestive) heart failure: I50.32

## 2023-01-26 HISTORY — DX: Hesitancy of micturition: R39.11

## 2023-01-26 HISTORY — DX: Acute myocardial infarction, unspecified: I21.9

## 2023-01-26 HISTORY — DX: Hypercalcemia: E83.52

## 2023-01-26 HISTORY — DX: Acute kidney failure, unspecified: N17.9

## 2023-01-26 HISTORY — DX: Sleep apnea, unspecified: G47.30

## 2023-01-26 HISTORY — DX: Angina pectoris, unspecified: I20.9

## 2023-01-26 HISTORY — DX: Cellulitis of unspecified part of limb: L03.119

## 2023-01-26 HISTORY — DX: Elevated white blood cell count, unspecified: D72.829

## 2023-01-26 HISTORY — DX: Other psychoactive substance abuse, uncomplicated: F19.10

## 2023-01-26 HISTORY — DX: Chronic obstructive pulmonary disease, unspecified: J44.9

## 2023-01-26 HISTORY — DX: Gastro-esophageal reflux disease without esophagitis: K21.9

## 2023-01-26 HISTORY — DX: Cocaine abuse, uncomplicated: F14.10

## 2023-01-26 NOTE — Patient Instructions (Addendum)
Your procedure is scheduled on: Friday December 6  Report to the Registration Desk on the 1st floor of the [facility] To find out your arrival time, please call [phone] between 1PM - 3PM on: Thursday December 5 If your arrival time is 6:00 am, do not arrive before that time as the Medical Mall entrance doors do not open until 6:00 am.  REMEMBER: Instructions that are not followed completely may result in serious medical risk, up to and including death; or upon the discretion of your surgeon and anesthesiologist your surgery may need to be rescheduled.  Do not eat food after midnight the night before surgery.  No gum chewing or hard candies.   One week prior to surgery: Friday November 29  Stop Anti-inflammatories (NSAIDS) such as Advil, Aleve, Ibuprofen, Motrin, Naproxen, Naprosyn and Aspirin based products such as Excedrin, Goody's Powder, BC Powder. Stop ANY OVER THE COUNTER supplements until after surgery.  You may however, continue to take Tylenol if needed for pain up until the day of surgery.  **Follow recommendations regarding stopping blood thinners.** Patient to receive direction regarding clopidogrel (PLAVIX) after his consultation with cardiology during a stress test appointment 01/27/23 at 0900  **Hold empagliflozin (JARDIANCE) 3 days, last dose Monday December 2**  Continue taking all of your other prescription medications up until the day of surgery.  ON THE DAY OF SURGERY ONLY TAKE THESE MEDICATIONS WITH SIPS OF WATER:  famotidine (PEPCID  metoprolol succinate (TOPROL-XL)  rosuvastatin (CRESTOR)  Fluticasone-Umeclidin-Vilant (TRELEGY ELLIPTA)   Use inhalers on the day of surgery and bring to the hospital. VENTOLIN HFA 108    No Alcohol for 24 hours before or after surgery.  No Smoking including e-cigarettes for 24 hours before surgery.  No chewable tobacco products for at least 6 hours before surgery.  No nicotine patches on the day of surgery.  Do  not use any "recreational" drugs for at least a week (preferably 2 weeks) before your surgery.  Please be advised that the combination of cocaine and anesthesia may have negative outcomes, up to and including death. If you test positive for cocaine, your surgery will be cancelled.  On the morning of surgery brush your teeth with toothpaste and water, you may rinse your mouth with mouthwash if you wish. Do not swallow any toothpaste or mouthwash.  Do not wear jewelry, make-up, hairpins, clips or nail polish.  For welded (permanent) jewelry: bracelets, anklets, waist bands, etc.  Please have this removed prior to surgery.  If it is not removed, there is a chance that hospital personnel will need to cut it off on the day of surgery.  Do not wear lotions, powders, or perfumes.   Do not shave body hair from the neck down 48 hours before surgery.  Contact lenses, hearing aids and dentures may not be worn into surgery.  Do not bring valuables to the hospital. [facility] is not responsible for any missing/lost belongings or valuables.   Notify your doctor if there is any change in your medical condition (cold, fever, infection).  Wear comfortable clothing (specific to your surgery type) to the hospital.  After surgery, you can help prevent lung complications by doing breathing exercises.  Take deep breaths and cough every 1-2 hours.    If you are being discharged the day of surgery, you will not be allowed to drive home. You will need a responsible individual to drive you home and stay with you for 24 hours after surgery.   If  you are taking public transportation, you will need to have a responsible individual with you.  Please call the Pre-admissions Testing Dept. at [phone] if you have any questions about these instructions.  Surgery Visitation Policy:  Patients having surgery or a procedure may have two visitors.  Children under the age of 11 must have an adult with them who  is not the patient.

## 2023-01-27 ENCOUNTER — Encounter: Payer: Self-pay | Admitting: Urology

## 2023-01-27 ENCOUNTER — Encounter: Payer: Self-pay | Admitting: Urgent Care

## 2023-01-27 ENCOUNTER — Ambulatory Visit
Admission: RE | Admit: 2023-01-27 | Discharge: 2023-01-27 | Disposition: A | Discharge status: Home / Self Care | Payer: Medicaid Other | Source: Ambulatory Visit | Attending: Student | Admitting: Student

## 2023-01-27 ENCOUNTER — Encounter
Admission: RE | Admit: 2023-01-27 | Discharge: 2023-01-27 | Disposition: A | Discharge status: Home / Self Care | Payer: Medicaid Other | Source: Ambulatory Visit | Attending: Urology | Admitting: Urology

## 2023-01-27 ENCOUNTER — Other Ambulatory Visit: Payer: Self-pay | Admitting: Nurse Practitioner

## 2023-01-27 DIAGNOSIS — Z01812 Encounter for preprocedural laboratory examination: Secondary | ICD-10-CM | POA: Insufficient documentation

## 2023-01-27 DIAGNOSIS — E785 Hyperlipidemia, unspecified: Secondary | ICD-10-CM | POA: Insufficient documentation

## 2023-01-27 DIAGNOSIS — I25118 Atherosclerotic heart disease of native coronary artery with other forms of angina pectoris: Secondary | ICD-10-CM | POA: Insufficient documentation

## 2023-01-27 DIAGNOSIS — I214 Non-ST elevation (NSTEMI) myocardial infarction: Secondary | ICD-10-CM | POA: Insufficient documentation

## 2023-01-27 DIAGNOSIS — I10 Essential (primary) hypertension: Secondary | ICD-10-CM | POA: Insufficient documentation

## 2023-01-27 DIAGNOSIS — R079 Chest pain, unspecified: Secondary | ICD-10-CM | POA: Diagnosis present

## 2023-01-27 DIAGNOSIS — F1910 Other psychoactive substance abuse, uncomplicated: Secondary | ICD-10-CM | POA: Insufficient documentation

## 2023-01-27 DIAGNOSIS — R21 Rash and other nonspecific skin eruption: Secondary | ICD-10-CM

## 2023-01-27 LAB — CBC
HCT: 46.5 % (ref 39.0–52.0)
Hemoglobin: 15.3 g/dL (ref 13.0–17.0)
MCH: 26.2 pg (ref 26.0–34.0)
MCHC: 32.9 g/dL (ref 30.0–36.0)
MCV: 79.5 fL — ABNORMAL LOW (ref 80.0–100.0)
Platelets: 348 10*3/uL (ref 150–400)
RBC: 5.85 MIL/uL — ABNORMAL HIGH (ref 4.22–5.81)
RDW: 17.6 % — ABNORMAL HIGH (ref 11.5–15.5)
WBC: 10.4 10*3/uL (ref 4.0–10.5)
nRBC: 0 % (ref 0.0–0.2)

## 2023-01-27 MED ORDER — REGADENOSON 0.4 MG/5ML IV SOLN
0.4000 mg | Freq: Once | INTRAVENOUS | Status: AC
  Administered 2023-01-27: 0.4 mg via INTRAVENOUS
  Filled 2023-01-27: qty 5

## 2023-01-27 MED ORDER — TECHNETIUM TC 99M TETROFOSMIN IV KIT
31.0600 | PACK | Freq: Once | INTRAVENOUS | Status: AC | PRN
  Administered 2023-01-27: 31.06 via INTRAVENOUS

## 2023-01-27 MED ORDER — TECHNETIUM TC 99M TETROFOSMIN IV KIT
10.0000 | PACK | Freq: Once | INTRAVENOUS | Status: AC
  Administered 2023-01-27: 9.75 via INTRAVENOUS

## 2023-01-28 ENCOUNTER — Encounter: Payer: Self-pay | Admitting: Urology

## 2023-01-28 LAB — NM MYOCAR MULTI W/SPECT W/WALL MOTION / EF
Estimated workload: 1
Exercise duration (min): 0 min
Exercise duration (sec): 0 s
LV dias vol: 75 mL (ref 62–150)
LV sys vol: 39 mL
MPHR: 171 {beats}/min
Nuc Stress EF: 48 %
Peak HR: 87 {beats}/min
Percent HR: 50 %
Rest HR: 73 {beats}/min
Rest Nuclear Isotope Dose: 9.8 mCi
SDS: 1
SRS: 1
SSS: 0
Stress Nuclear Isotope Dose: 31.1 mCi
TID: 1.05

## 2023-01-28 NOTE — Progress Notes (Incomplete)
Perioperative / Anesthesia Services  Pre-Admission Testing Clinical Review / Pre-Operative Anesthesia Consult  Date: 01/28/23  Patient Demographics:  Name: Logan Crawford DOB:   Apr 04, 1975 MRN:   [id number]  Planned Surgical Procedure(s):    Case: [id number] Date/Time: 01/30/23 0715   Procedure: EXCISION OF SCROTUM LESION, SEBACEOUS CYST   Anesthesia type: General   Pre-op diagnosis: Scrotal Lesion   Location: [facility] OR ROOM 05 / [facility]   Surgeons: Sondra Come, MD     NOTE: Available PAT nursing documentation and vital signs have been reviewed. Clinical nursing staff has updated patient's PMH/PSHx, current medication list, and drug allergies/intolerances to ensure comprehensive history available to assist in medical decision making as it pertains to the aforementioned surgical procedure and anticipated anesthetic course. Extensive review of available clinical information personally performed. [facility] PM[facility] and PSHx updated with any diagnoses/procedures that  may have been inadvertently omitted during his intake with the pre-admission testing department's nursing staff.  Clinical Discussion:  Logan Crawford is a 47 y.o. male who is submitted for pre-surgical anesthesia review and clearance prior to him undergoing the above procedure. Patient is a Current Smoker (10 pack years). Pertinent PMH includes: CAD (s/p CABG), NSTEMI x 2, perioperative complete heart block (related to hyperkalemia), ischemic cardiomyopathy, HFpEF, angina, aortic atherosclerosis, HTN, HLD, CKD-III, GERD (on daily PPI), hiatal hernia, postoperative BILATERAL pleural effusions (required LEFT thoracentesis), COPD, OSAH (requires nocturnal PAP therapy), hiatal hernia, scrotal lesion, polysubstance abuse (cocaine + THC + opiates + tobacco).   Patient is followed by cardiology (***, MD). He was last seen in the cardiology clinic on ***; notes reviewed. ***At the time of his clinic visit, patient  doing well overall from a cardiovascular perspective. Patient denied any chest pain, shortness of breath, PND, orthopnea, palpitations, significant peripheral edema, weakness, fatigue, vertiginous symptoms, or presyncope/syncope. Patient with a past medical history significant for cardiovascular diagnoses. Documented physical exam was grossly benign, providing no evidence of acute exacerbation and/or decompensation of the patient's known cardiovascular conditions.  ***  Blood pressure***controlled at *** mmHg on currently prescribed*** therapies.  Patient is on***for his HLD diagnosis and ASCVD prevention. Patient is not diabetic/T2DM***controlled on currently prescribed regimen; last HgbA1c was***when checked on***. He does not have an OSAH diagnosis. ***FC. No changes were made to his medication regimen during his visit with cardiology.  Patient scheduled to follow-up with outpatient cardiology in***months or sooner if needed.  Logan Crawford is scheduled for an elective EXCISION OF SCROTUM LESION, SEBACEOUS CYST on 01/30/2023 with Dr. Legrand Rams, MD.  Given patient's past medical history significant for cardiovascular diagnoses, presurgical cardiac clearance was sought by the PAT team.  ***  Again, given his significant cardiovascular history, patient remains on daily DAPT therapy.  He has been instructed on recommendations from his cardiologist for holding his ***for ***days prior to his procedure with plans to restart as soon as postoperative bleeding risk felt to be minimized by his primary attending surgeon.  The patient is aware that his last dose of ***should be on***.   Patient reports previous perioperative complications with anesthesia in the past.  Patient experienced (+) significant perioperative HYPERkalemia during his CABG at [facility] in 01/2022 (ASA IV). Preoperative K+ was 4.6 mmol/L with a creatinine of 1.3 mg/dL.  Prior to being placed on cardiopulmonary bypass, patient  with significant elevation of his K+ level.  Initial consideration was atypical malignant hyperthermia, thus patient was treated with intravenous dantrolene.  K+ level  was / managed intraoperatively with furosemide + insulin + calcium. Additionally,  patient treated with extended dialysis equivalent via cardiopulmonary bypass pump (remained on pump 1 hour after procedure). Hyperkalemia persisted postoperatively, peaking at 7.8 mmol/L. Etiology of electrolyte derangement unknown.  Providers did consult with " malignant hyperthermia society" and were advised that event was "unlikely" related to malignant hyperthermia.  No confirmatory CHCT testing was performed.  Patient was advised by providers that if he ever underwent anesthesia again, he should advise his anesthesia provider that he experienced an unexplained episode of perioperative hyperkalemia in the past.     01/26/2023    4:05 PM 01/21/2023    3:12 PM 01/13/2023    2:58 PM  Vitals with BMI  Height 6\' 0"  6\' 0"  6\' 0"   Weight 230 lbs 230 lbs 10 oz 232 lbs  BMI 31.19 31.27 31.46  Systolic  135 128  Diastolic  88 84  Pulse  81 90    Providers/Specialists:   NOTE: Primary physician provider listed below. Patient may have been seen by APP or partner within same practice.   PROVIDER ROLE / SPECIALTY LAST Lise Auer, MD Urology (Surgeon) 01/13/2023  Donell Beers, FNP Primary Care Provider 10/20/2022  Debbe Odea, MD Cardiology 01/21/2023  Arvilla Meres, MD Advanced Heart Failure 10/22/2022  Clovis Pu, MD Pulmonary Medicine 08/06/2022   Allergies:  Bee pollen  Current Home Medications:   No current facility-administered medications for this encounter.    aspirin EC 81 MG tablet   clopidogrel (PLAVIX) 75 MG tablet   empagliflozin (JARDIANCE) 10 MG TABS tablet   famotidine (PEPCID) 20 MG tablet   Fluticasone-Umeclidin-Vilant (TRELEGY ELLIPTA) 100-62.5-25 MCG/ACT AEPB   furosemide (LASIX) 40 MG tablet    losartan (COZAAR) 50 MG tablet   metoprolol succinate (TOPROL-XL) 25 MG 24 hr tablet   nitroGLYCERIN (NITROSTAT) 0.4 MG SL tablet   NON FORMULARY   pantoprazole (PROTONIX) 40 MG tablet   rosuvastatin (CRESTOR) 40 MG tablet   spironolactone (ALDACTONE) 25 MG tablet   VENTOLIN HFA 108 (90 Base) MCG/ACT inhaler   nystatin (MYCOSTATIN/NYSTOP) powder   triamcinolone (KENALOG) 0.025 % cream   History:   Past Medical History:  Diagnosis Date   (HFpEF) heart failure with preserved ejection fraction (HCC)    a.) TTE 11/26/19: EF 50-55, G1DD; b.) TTE 01/24/20: EF 50, LVH, mid-apical anterosep HK, G1DD; c.) TTE 05/18/21: EF 50-55, anterosep/sep/apical HK, LVH, mild MR, AoV calc; d.) TTE 07/16/21: EF 60-65, LVH, G2DD, mild MD; e.) TTE 02/18/22: EF 55, sep/inf HK, LVH, G1DD, mild MR; f.) TTE 03/19/22: EF 65-70, anterosep/periapical HK, LVH, G1DD, mild MR, AoV calc   Anginal pain (HCC)    Aortic atherosclerosis (HCC)    Bilateral pleural effusion 02/20/2022   a.) s/p CABG and LAA closure (L > R) --> progressive during admission --> ultimately required LEFT thoracentesis on 03/10/2022 (750 cc serous yield)   CAD (coronary artery disease)    a.)NSTEMI 01/23/20 - PCI 01/24/20: 90 mLAD (2.75 x 22mm Res Onyx DES); b.)NSTEMI 05/18/21 - PCI 05/20/21: 85 mLAD w/ dist edge dissect (2.75 x 24mm + 2.5 x 8mm Onyx Frontier DES); c.)LHC 11/18/21: 80 ISR + 60 mLAD - CVTS vs stent POBA; d.)LHC 02/13/22: 70 ISR p-mLAD, 80/50 ISR mLAD, 99 ISR dLAD, 80 jailed D2, diff HG RCA Dz - CVTS; e.)3v CABG 02/19/22; f.)MV 01/27/23: mod rev apical def c/w isch   CKD (chronic kidney disease), stage III (HCC)    Complete heart block (HCC)  02/19/2022   a.) during CABG/LAA closure --> hyperkalemic CHB in OR before heart surgery had functionally begun (not on bypass yet); etiology unknown; ?? due to medication administration or contaminant given fairly normal preop renal function (Cr 1.3). Persisted post-bypass event with extensive  dialysis equivalent via CPB pump --> Tx'd with dantrolene   Complication of anesthesia 02/19/2022   a.) perioperative HYPERkalemia - concern for atypical MH initially - Tx'd intraoperaitvely with dantrolene. Preop K+ 4.6 and CR 1.3 --> K+ peaked at 7.8 intra/postop --> rec'd furosemide + insulin + calcium + extended CBP (reamined on pump at least 1 hour after procedure). Consult with MH society determined "unlikely" MH (see Argwala, MD note 02/22/2022). No confirmatory CHCT testing.   COPD (chronic obstructive pulmonary disease) (HCC)    Genital warts    GERD (gastroesophageal reflux disease)    Hiatal hernia    History of left atrial appendage closure 02/19/2022   a.) performed simultaneously with cardiac revascularization (CABG) procedure on 02/19/2022   Hyperlipidemia    Hypertension    Ischemic cardiomyopathy    a.) TTE 11/26/19: EF 50-55%; b.) TTE 01/24/20: EF 50%; c.) LHC 01/24/20: EF 45-50%; d;) MV 11/29/20: EF 43%; e.) TTE 05/18/21: EF 50-55%; f.) LHC 05/20/21: EF 50-55%; g.) TTE 07/16/21: EF 60-65%; h.) TTE 11/16/21: EF 60-65%; i.) LHC 11/18/21: EF 55-65%; j.) TTE 02/12/22: EF 60-65%; k.) TTE 02/18/22: EF 55%; l.) TTE 03/19/22: EF 65-70%; m.) TTE 05/01/22: EF 55%; n.) MV 01/27/23: EF 45-50%   Leukocytosis    Long term current use of clopidogrel    Long-term use of aspirin therapy    NSTEMI (non-ST elevated myocardial infarction) (HCC) 01/23/2020   a.) troponins were trended: 3520 --> 2990 ng/L; b.) LHC/PCU 01/24/2020: 60% o-pRCA, 40% p-mRCA, 90% mLAD (2.75 x 22 mm Resolute Onyx DES), 40% dLAD   NSTEMI (non-ST elevated myocardial infarction) (HCC) 05/18/2021   a.) troponins were trended: 101 --> 110 --> 175 ng/L; b.) LHC/PCI 05/20/2021: 40% dLAD, 40% p-mRCA, 50% o-pRCA, 40% RPDA, 30% oLM, 85% mLAD with distal edge dissection (overlapping 2.75 x 34 mm and 2.5 x 8 mm Onyx Frontier DES), 60% D2   OSA on CPAP    Polysubstance abuse (HCC)    a.) cocaine, THC, opiates, tobacco    Postoperative cardiogenic shock (HCC) 02/19/2022   a.) required vasopressor/ionotropic agent support + IABP   Recurrent cellulitis of thigh    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 Consult with MH society determined MH unlikely (see Argwala, MD note 02/22/2022); b.) c/b postop cardiogenic shock req vasopressor/inotrophic support + IABP; c.) c/b dev of BILATERAL pleural effusions (L > R) req LEFT thoracentesis (750 cc yield)   Scrotal lesion    Tobacco use 11/25/2019   Past Surgical History:  Procedure Laterality Date   CORONARY ARTERY BYPASS GRAFT N/A 02/19/2022   Procedure: CORONARY ARTERY BYPASS GRAFTING (CABG) X THREE, USING ENDOSCOPICALLY HARVESTED RIGHT GREATER SAPHENOUS VEIN  AND LEFT ATRIAL APPENDAGE CLIPPING;  Surgeon: Lyn Hollingshead, MD;  Location: [facility] OR;  Service: Open Heart Surgery;  Laterality: N/A;   CORONARY STENT INTERVENTION N/A 01/24/2020   Procedure: CORONARY STENT INTERVENTION;  Surgeon: Iran Ouch, MD;  Location: [facility] INVASIVE CV LAB;  Service: Cardiovascular;  Laterality: N/A;   CORONARY STENT INTERVENTION N/A 05/20/2021   Procedure: CORONARY STENT INTERVENTION;  Surgeon: Iran Ouch, MD;  Location: [facility]  INVASIVE CV LAB;  Service: Cardiovascular;  Laterality: N/A;   CORONARY ULTRASOUND/IVUS N/A 05/20/2021   Procedure: Intravascular Ultrasound/IVUS;  Surgeon: Iran Ouch, MD;  Location: [facility] INVASIVE CV LAB;  Service: Cardiovascular;  Laterality: N/A;   IRRIGATION AND DEBRIDEMENT KNEE Left    LEFT ATRIAL APPENDAGE OCCLUSION Left 02/19/2022   Procedure: LEFT ATRIAL APPENDAGE OCCLUSION; Location: [facility]; Surgeon: Clare Charon, MD   LEFT HEART CATH AND CORONARY ANGIOGRAPHY N/A 01/24/2020   Procedure: LEFT HEART CATH AND CORONARY ANGIOGRAPHY;  Surgeon: Iran Ouch, MD;  Location: [facility] INVASIVE CV LAB;  Service:  Cardiovascular;  Laterality: N/A;   LEFT HEART CATH AND CORONARY ANGIOGRAPHY N/A 11/18/2021   Procedure: LEFT HEART CATH AND CORONARY ANGIOGRAPHY;  Surgeon: Iran Ouch, MD;  Location: [facility] INVASIVE CV LAB;  Service: Cardiovascular;  Laterality: N/A;   LEFT HEART CATH AND CORONARY ANGIOGRAPHY N/A 02/13/2022   Procedure: LEFT HEART CATH AND CORONARY ANGIOGRAPHY;  Surgeon: Marykay Lex, MD;  Location: [facility] INVASIVE CV LAB;  Service: Cardiovascular;  Laterality: N/A;   LEFT HEART CATH AND CORS/GRAFTS ANGIOGRAPHY N/A 05/20/2021   Procedure: LEFT HEART CATH AND CORS/GRAFTS ANGIOGRAPHY;  Surgeon: Iran Ouch, MD;  Location: [facility] INVASIVE CV LAB;  Service: Cardiovascular;  Laterality: N/A;   TYMPANOSTOMY TUBE PLACEMENT Bilateral    Family History  Problem Relation Age of Onset   Coronary artery disease Mother    Arrhythmia Mother        Pacemaker placed in 2021   Heart failure Mother    Breast cancer Mother    Coronary artery disease Father    Colon cancer Father    Colon polyps Father    Social History   Tobacco Use   Smoking status: Every Day    Current packs/day: 1.00    Average packs/day: 1 pack/day for 10.0 years (10.0 ttl pk-yrs)    Types: Cigarettes    Passive exposure: Past   Smokeless tobacco: Never   Tobacco comments:    2-3 cigarettes daily- 08/06/2022  Vaping Use   Vaping status: Never Used  Substance Use Topics   Alcohol use: Yes    Alcohol/week: 1.0 standard drink of alcohol    Types: 1 Shots of liquor per week    Comment: socially   Drug use: Yes    Types: Codeine    Pertinent Clinical Results:  LABS:   Lab Results  Component Value Date   WBC 10.4 01/27/2023   HGB 15.3 01/27/2023   HCT 46.5 01/27/2023   MCV 79.5 (L) 01/27/2023   PLT 348 01/27/2023   Lab Results  Component Value Date   NA 138 01/21/2023   K 4.7 01/21/2023   CO2 24 01/21/2023   GLUCOSE 80 01/21/2023   BUN 10 01/21/2023   CREATININE 1.41 (H) 01/21/2023   CALCIUM 9.9  01/21/2023   EGFR 61 01/21/2023   GFRNONAA 58 (L) 06/20/2022    ECG: Date: 01/21/2023 Time ECG obtained: 1517 PM Rate: 81 bpm Rhythm: normal sinus Axis (leads I and aVF): Normal Intervals: PR 160 ms. QRS 70 ms. QTc 469 ms. ST segment and T wave changes: Inferolateral ST and T wave abnormalities.  Evidence of a possible age undetermined inferior and anterior infarcts present. Comparison: Inferior and anterior ST and T wave changes appear to have progressed since prior tracing on 06/20/2022   IMAGING / PROCEDURES: MYOCARDIAL PERFUSION IMAGING STUDY (LEXISCAN) performed on 01/27/2023 Mildly reduced left ventricular systolic function with an EF of 45-50% Left ventricular wall motion  is globally hypokinetic Moderate in size, moderate in severity, reversible defect involving the apex concerning for ischemia, however cannot exclude an element of artifact. Attenuation correction CT images demonstrate the sequela of CABG, PCI, and LAA clipping Able to complete 50% of a 171 bpm MPHR (max heart rate 87 bpm).  Estimated workload 1 METS TID = 1.05 Patient complained of dyspnea and flushing during the stress test Abnormal intermediate risk pharmacologic myocardial perfusion stress test  PULMONARY FUNCTION TESTING performed on 06/20/2022    Latest Ref Rng & Units 06/20/2022    1:17 PM  PFT Results  FVC-Pre L 3.37   FVC-Predicted Pre % 61   FVC-Post L 3.57   FVC-Predicted Post % 65   Pre FEV1/FVC % % 74   Post FEV1/FCV % % 76   FEV1-Pre L 2.50   FEV1-Predicted Pre % 58   FEV1-Post L 2.72   DLCO uncorrected ml/min/mmHg 22.30   DLCO UNC% % 70   DLVA Predicted % 107   TLC L 3.73   TLC % Predicted % 50   RV % Predicted % 21    TRANSTHORACIC ECHOCARDIOGRAM performed on 05/01/2022 Left ventricular ejection fraction, by estimation, is 55%. The left ventricle has normal function. The left ventricle demonstrates regional wall motion abnormalities (hypokinesis of the distal anteroseptal and  periapical region, seen previously 1/24). There is moderate left ventricular hypertrophy. Left ventricular diastolic parameters are consistent with Grade I diastolic dysfunction (impaired relaxation).  Right ventricular systolic function is normal. The right ventricular size is normal.  The mitral valve is normal in structure. Mild mitral valve regurgitation. No evidence of mitral stenosis.  The aortic valve is normal in structure. There is mild calcification of the aortic valve. Aortic valve regurgitation is not visualized. Aortic valve sclerosis/calcification is present, without any evidence of aortic stenosis.  The inferior vena cava is normal in size with greater than 50% respiratory variability, suggesting right atrial pressure of 3 mmHg.   CORONARY ARTERY BYPASS GRAFTING AND LEFT ATRIAL APPENDAGE CLOSURE performed on 02/19/2022 LAA clipping 3v CABG LIMA-D1 SVG-LAD SVG-PDA Procedure complicated by perioperative hyperkalemia.  Initially thought to be secondary to atypical malignant hyperthermia. Peak K+ 7.8 mmol/L.  Dialysis equivalent with cardiopulmonary bypass intraoperatively. Later discussions with "MH society" determine that MH "unlikely". No HCT biopsy performed.  Postoperative cardiogenic shock requiring vasopressor/inotropic agent support and IABP. Postoperative development of BILATERAL pleural effusions (L > R), ultimately requiring LEFT thoracentesis (750 cc yield) on 03/10/2022.  LEFT HEART CATHETERIZATION AND CORONARY ANGIOGRAPHY performed on 02/14/2020 Severely elevated LVEDP of 32 mmHg Multivessel CAD 70% ISR proximal to mid LAD 80% ISR mid LAD-1 50% ISR mid LAD-2 99% ISR distal LAD 80% jailed D2 70% proximal RCA-1 70% proximal RCA-2 80% mid RCA-1 95% mid RCA-2 40% distal RCA 60% RV branch 60% RPDA Recommendations: CVTS consult for consideration of revascularization   Impression and Plan:  Logan Crawford has been referred for pre-anesthesia review and clearance  prior to him undergoing the planned anesthetic and procedural courses. Available labs, pertinent testing, and imaging results were personally reviewed by me in preparation for upcoming operative/procedural course. [facility] medical record has been updated following extensive record review and patient interview with PAT staff.   ATTENTION --> PENDING CLEARANCE AT THIS TIME -- NOTE/CONTENTS NOT FINAL UNTIL SIGNED This patient has been appropriately cleared by cardiology with an overall *** risk of experiencing significant perioperative cardiovascular complications. Based on clinical review performed today (01/28/23), barring any significant acute changes in the  patient's overall condition, it is anticipated that he will be able to proceed with the planned surgical intervention. Any acute changes in clinical condition may necessitate his procedure being postponed and/or cancelled. Patient will meet with anesthesia team (MD and/or CRNA) on the day of his procedure for preoperative evaluation/assessment. Questions regarding anesthetic course will be fielded at that time.   Pre-surgical instructions were reviewed with the patient during his PAT appointment, and questions were fielded to satisfaction by PAT clinical staff. He has been instructed on which medications that he will need to hold prior to surgery, as well as the ones that have been deemed safe/appropriate to take on the day of his procedure. As part of the general education provided by PAT, patient made aware both verbally and in writing, that he would need to abstain from the use of any illegal substances during his perioperative course.  He was advised that failure to follow the provided instructions could necessitate case cancellation or result in serious perioperative complications up to and including death. Patient encouraged to contact PAT and/or his surgeon's office to discuss any questions or concerns that may arise prior to surgery; verbalized  understanding.   Quentin Mulling, MSN, APRN, FNP-C, CEN [facility]  Perioperative Services Nurse Practitioner Phone: [phone] Fax: [phone] 01/28/23 1:06 PM  NOTE: This note has been prepared using Dragon dictation software. Despite my best ability to proofread, there is always the potential that unintentional transcriptional errors may still occur from this process.

## 2023-01-29 ENCOUNTER — Telehealth: Payer: Self-pay | Admitting: Student

## 2023-01-29 ENCOUNTER — Telehealth: Payer: Self-pay

## 2023-01-29 ENCOUNTER — Telehealth: Payer: Self-pay | Admitting: *Deleted

## 2023-01-29 NOTE — Telephone Encounter (Signed)
Patient called with instructions for the Left Heart Cath. They have also been sent to MyChart.   You are scheduled for a Cardiac Catheterization on Tuesday, December 10 with Dr. Cristal Deer End.  1. Please arrive at the [facility] Entrance of [facility], [address], [place] [address] at 8:00 AM (This is 1 hour(s) prior to your procedure time).  Proceed to the Check-In Desk directly inside the entrance.  Procedure Parking: Use the entrance off of the [address] side of the hospital. Turn right upon entering and follow the driveway to parking that is directly in front of the Heart & Vascular Center. There is no valet parking available at this entrance, however there is an awning directly in front of the Heart & Vascular Center for drop off/ pick up for patients.  Special note: Every effort is made to have your procedure done on time. Please understand that emergencies sometimes delay scheduled procedures.  2. Diet: Do not eat solid foods after midnight.  The patient may have clear liquids until 5am upon the day of the procedure.  3. Labs: Labs have been completed on 11/27.  4. Medication instructions in preparation for your procedure: Hold the Spironolactone the morning of the procedure Hold the Furosemide the morning of the procedure Hold the Jardiance the morning of the procedure   On the morning of your procedure, take your Aspirin 81 mg and Plavix and any morning medicines NOT listed above.  You may use sips of water.  5. Plan to go home the same day, you will only stay overnight if medically necessary. 6. Bring a current list of your medications and current insurance cards. 7. You MUST have a responsible person to drive you home. 8. Someone MUST be with you the first 24 hours after you arrive home or your discharge will be delayed. 9. Please wear clothes that are easy to get on and off and wear slip-on shoes.  Thank you for allowing [place] to care for you!   -- [facility] Invasive Cardiovascular services

## 2023-01-29 NOTE — Telephone Encounter (Signed)
I spoke with Anesthesia team and Cardiology prior to surgery. Patient will need to have a cardiac cath prior to clearance being given for surgery. I Spoke with pt and explained this to him, patient encouraged to call Cardiology. Once patient has a Cath he will reach out to [place] and we will get him scheduled. Patient verbalized understanding. Dr. Richardo Hanks has been updated.

## 2023-01-29 NOTE — Addendum Note (Signed)
Addended by: Carlos Levering on: 01/29/2023 11:35 AM   Modules accepted: Orders

## 2023-01-29 NOTE — Telephone Encounter (Signed)
Spoke with patient via telephone to discuss results of stress test. He is in agreement with proceeding with LHC.    Shared Decision Making/Informed Consent The risks [stroke (1 in 1000), death (1 in 1000), kidney failure [usually temporary] (1 in 500), bleeding (1 in 200), allergic reaction [possibly serious] (1 in 200)], benefits (diagnostic support and management of coronary artery disease) and alternatives of a cardiac catheterization were discussed in detail with Mr. Noe and he is willing to proceed.   Etta Grandchild. Kendry Pfarr, DNP, NP-C  01/29/2023, 10:56 AM [facility] [address] Office [phone] Fax [phone]

## 2023-01-30 ENCOUNTER — Encounter: Admission: RE | Payer: Self-pay | Source: Home / Self Care

## 2023-01-30 ENCOUNTER — Ambulatory Visit: Admission: RE | Admit: 2023-01-30 | Payer: Medicaid Other | Source: Home / Self Care | Admitting: Urology

## 2023-01-30 HISTORY — DX: Atherosclerosis of aorta: I70.0

## 2023-01-30 HISTORY — DX: Chronic kidney disease, stage 3 unspecified: N18.30

## 2023-01-30 HISTORY — DX: Long term (current) use of antithrombotics/antiplatelets: Z79.02

## 2023-01-30 HISTORY — DX: Diaphragmatic hernia without obstruction or gangrene: K44.9

## 2023-01-30 HISTORY — DX: Long term (current) use of aspirin: Z79.82

## 2023-01-30 HISTORY — DX: Disorder of male genital organs, unspecified: N50.9

## 2023-01-30 HISTORY — DX: Unspecified diastolic (congestive) heart failure: I50.30

## 2023-01-30 HISTORY — DX: Anogenital (venereal) warts: A63.0

## 2023-01-30 HISTORY — DX: Obstructive sleep apnea (adult) (pediatric): G47.33

## 2023-01-30 SURGERY — EXPLORATION, SCROTUM
Anesthesia: General

## 2023-02-02 ENCOUNTER — Ambulatory Visit: Payer: Medicaid Other | Admitting: Cardiology

## 2023-02-03 ENCOUNTER — Ambulatory Visit: Payer: Medicaid Other | Admitting: Certified Registered Nurse Anesthetist

## 2023-02-03 ENCOUNTER — Encounter
Admission: RE | Disposition: A | Discharge status: Home / Self Care | Payer: Self-pay | Source: Home / Self Care | Attending: Internal Medicine

## 2023-02-03 ENCOUNTER — Other Ambulatory Visit: Payer: Self-pay

## 2023-02-03 ENCOUNTER — Encounter: Payer: Self-pay | Admitting: Internal Medicine

## 2023-02-03 ENCOUNTER — Ambulatory Visit
Admission: RE | Admit: 2023-02-03 | Discharge: 2023-02-03 | Disposition: A | Discharge status: Home / Self Care | Payer: Medicaid Other | Attending: Internal Medicine | Admitting: Internal Medicine

## 2023-02-03 DIAGNOSIS — E785 Hyperlipidemia, unspecified: Secondary | ICD-10-CM | POA: Diagnosis not present

## 2023-02-03 DIAGNOSIS — N1831 Chronic kidney disease, stage 3a: Secondary | ICD-10-CM | POA: Diagnosis not present

## 2023-02-03 DIAGNOSIS — I5032 Chronic diastolic (congestive) heart failure: Secondary | ICD-10-CM | POA: Insufficient documentation

## 2023-02-03 DIAGNOSIS — Z7982 Long term (current) use of aspirin: Secondary | ICD-10-CM | POA: Insufficient documentation

## 2023-02-03 DIAGNOSIS — I2510 Atherosclerotic heart disease of native coronary artery without angina pectoris: Secondary | ICD-10-CM | POA: Diagnosis not present

## 2023-02-03 DIAGNOSIS — Z955 Presence of coronary angioplasty implant and graft: Secondary | ICD-10-CM | POA: Diagnosis not present

## 2023-02-03 DIAGNOSIS — I25810 Atherosclerosis of coronary artery bypass graft(s) without angina pectoris: Secondary | ICD-10-CM | POA: Diagnosis not present

## 2023-02-03 DIAGNOSIS — Z8616 Personal history of COVID-19: Secondary | ICD-10-CM | POA: Diagnosis not present

## 2023-02-03 DIAGNOSIS — I130 Hypertensive heart and chronic kidney disease with heart failure and stage 1 through stage 4 chronic kidney disease, or unspecified chronic kidney disease: Secondary | ICD-10-CM | POA: Diagnosis not present

## 2023-02-03 DIAGNOSIS — Z7902 Long term (current) use of antithrombotics/antiplatelets: Secondary | ICD-10-CM | POA: Diagnosis not present

## 2023-02-03 DIAGNOSIS — R0789 Other chest pain: Secondary | ICD-10-CM | POA: Diagnosis present

## 2023-02-03 DIAGNOSIS — I252 Old myocardial infarction: Secondary | ICD-10-CM | POA: Diagnosis not present

## 2023-02-03 DIAGNOSIS — R9439 Abnormal result of other cardiovascular function study: Secondary | ICD-10-CM | POA: Diagnosis not present

## 2023-02-03 DIAGNOSIS — R079 Chest pain, unspecified: Secondary | ICD-10-CM

## 2023-02-03 DIAGNOSIS — K219 Gastro-esophageal reflux disease without esophagitis: Secondary | ICD-10-CM | POA: Diagnosis not present

## 2023-02-03 HISTORY — PX: LEFT HEART CATH AND CORS/GRAFTS ANGIOGRAPHY: CATH118250

## 2023-02-03 SURGERY — LEFT HEART CATH AND CORS/GRAFTS ANGIOGRAPHY
Anesthesia: Moderate Sedation

## 2023-02-03 MED ORDER — ACETAMINOPHEN 325 MG PO TABS
ORAL_TABLET | ORAL | Status: AC
  Administered 2023-02-03: 650 mg via ORAL
  Filled 2023-02-03: qty 2

## 2023-02-03 MED ORDER — HEPARIN SODIUM (PORCINE) 1000 UNIT/ML IJ SOLN
INTRAMUSCULAR | Status: DC | PRN
  Administered 2023-02-03: 5000 [IU] via INTRAVENOUS

## 2023-02-03 MED ORDER — FENTANYL CITRATE (PF) 100 MCG/2ML IJ SOLN
INTRAMUSCULAR | Status: DC | PRN
  Administered 2023-02-03: 50 ug via INTRAVENOUS

## 2023-02-03 MED ORDER — FENTANYL CITRATE (PF) 100 MCG/2ML IJ SOLN
INTRAMUSCULAR | Status: AC
  Filled 2023-02-03: qty 2

## 2023-02-03 MED ORDER — LABETALOL HCL 5 MG/ML IV SOLN: 10.0000 mg | INTRAVENOUS | Status: DC | PRN

## 2023-02-03 MED ORDER — ACETAMINOPHEN 325 MG PO TABS: 650.0000 mg | ORAL_TABLET | ORAL | Status: DC | PRN

## 2023-02-03 MED ORDER — SODIUM CHLORIDE 0.9% FLUSH
10.0000 mL | Freq: Two times a day (BID) | INTRAVENOUS | Status: DC
  Administered 2023-02-03: 10 mL via INTRAVENOUS

## 2023-02-03 MED ORDER — ASPIRIN 81 MG PO CHEW: 81.0000 mg | CHEWABLE_TABLET | ORAL | Status: DC

## 2023-02-03 MED ORDER — SODIUM CHLORIDE 0.9% FLUSH: 3.0000 mL | INTRAVENOUS | Status: DC | PRN

## 2023-02-03 MED ORDER — ONDANSETRON HCL 4 MG/2ML IJ SOLN
4.0000 mg | Freq: Four times a day (QID) | INTRAMUSCULAR | Status: DC | PRN
Start: 2023-02-03 — End: 2023-02-03

## 2023-02-03 MED ORDER — VERAPAMIL HCL 2.5 MG/ML IV SOLN
INTRAVENOUS | Status: DC | PRN
  Administered 2023-02-03: 2.5 mg via INTRA_ARTERIAL

## 2023-02-03 MED ORDER — ISOSORBIDE MONONITRATE ER 30 MG PO TB24: 30.0000 mg | ORAL_TABLET | Freq: Every day | ORAL | 11 refills | Status: DC

## 2023-02-03 MED ORDER — LIDOCAINE HCL 1 % IJ SOLN
INTRAMUSCULAR | Status: AC
  Filled 2023-02-03: qty 20

## 2023-02-03 MED ORDER — IOHEXOL 300 MG/ML  SOLN
INTRAMUSCULAR | Status: DC | PRN
  Administered 2023-02-03: 50 mL

## 2023-02-03 MED ORDER — HEPARIN SODIUM (PORCINE) 1000 UNIT/ML IJ SOLN
INTRAMUSCULAR | Status: AC
  Filled 2023-02-03: qty 10

## 2023-02-03 MED ORDER — HYDRALAZINE HCL 20 MG/ML IJ SOLN: 10.0000 mg | INTRAMUSCULAR | Status: DC | PRN

## 2023-02-03 MED ORDER — NITROGLYCERIN 0.4 MG SL SUBL: 0.4000 mg | SUBLINGUAL_TABLET | SUBLINGUAL | 99 refills | Status: DC | PRN

## 2023-02-03 MED ORDER — HEPARIN (PORCINE) IN NACL 1000-0.9 UT/500ML-% IV SOLN
INTRAVENOUS | Status: AC
  Filled 2023-02-03: qty 1000

## 2023-02-03 MED ORDER — SODIUM CHLORIDE 0.9% FLUSH
3.0000 mL | Freq: Two times a day (BID) | INTRAVENOUS | Status: DC
Start: 2023-02-03 — End: 2023-02-03

## 2023-02-03 MED ORDER — HEPARIN (PORCINE) IN NACL 1000-0.9 UT/500ML-% IV SOLN
INTRAVENOUS | Status: DC | PRN
  Administered 2023-02-03 (×2): 500 mL

## 2023-02-03 MED ORDER — FUROSEMIDE 10 MG/ML IJ SOLN
20.0000 mg | Freq: Once | INTRAMUSCULAR | Status: AC
Start: 2023-02-03 — End: 2023-02-03

## 2023-02-03 MED ORDER — SODIUM CHLORIDE 0.9 % IV SOLN: INTRAVENOUS | Status: DC

## 2023-02-03 MED ORDER — SODIUM CHLORIDE 0.9 % IV SOLN: 250.0000 mL | INTRAVENOUS | Status: DC | PRN

## 2023-02-03 MED ORDER — VERAPAMIL HCL 2.5 MG/ML IV SOLN
INTRAVENOUS | Status: AC
  Filled 2023-02-03: qty 2

## 2023-02-03 MED ORDER — FUROSEMIDE 10 MG/ML IJ SOLN
INTRAMUSCULAR | Status: AC
  Administered 2023-02-03: 20 mg via INTRAVENOUS
  Filled 2023-02-03: qty 2

## 2023-02-03 MED ORDER — SODIUM CHLORIDE 0.9 % IV SOLN: INTRAVENOUS | Status: AC

## 2023-02-03 MED ORDER — MIDAZOLAM HCL 2 MG/2ML IJ SOLN
INTRAMUSCULAR | Status: DC | PRN
  Administered 2023-02-03: 1 mg via INTRAVENOUS

## 2023-02-03 MED ORDER — MIDAZOLAM HCL 2 MG/2ML IJ SOLN
INTRAMUSCULAR | Status: AC
  Filled 2023-02-03: qty 2

## 2023-02-03 MED ORDER — LIDOCAINE HCL (PF) 1 % IJ SOLN
INTRAMUSCULAR | Status: DC | PRN
  Administered 2023-02-03: 2 mL

## 2023-02-03 SURGICAL SUPPLY — 12 items
CATH INFINITI 5 FR IM (CATHETERS) IMPLANT
CATH INFINITI 5 FR MPA2 (CATHETERS) IMPLANT
CATH INFINITI 5FR MULTPACK ANG (CATHETERS) IMPLANT
DEVICE RAD TR BAND REGULAR (VASCULAR PRODUCTS) IMPLANT
DRAPE BRACHIAL (DRAPES) IMPLANT
GLIDESHEATH SLEND SS 6F .021 (SHEATH) IMPLANT
GUIDEWIRE INQWIRE 1.5J.035X260 (WIRE) IMPLANT
INQWIRE 1.5J .035X260CM (WIRE) ×1
PACK CARDIAC CATH (CUSTOM PROCEDURE TRAY) ×1 IMPLANT
PROTECTION STATION PRESSURIZED (MISCELLANEOUS) ×1
SET ATX-X65L (MISCELLANEOUS) IMPLANT
STATION PROTECTION PRESSURIZED (MISCELLANEOUS) IMPLANT

## 2023-02-03 NOTE — Brief Op Note (Signed)
BRIEF CARDIAC CATHETERIZATION NOTE  02/03/2023  2:25 PM  PATIENT:  Logan Crawford  47 y.o. male  PRE-OPERATIVE DIAGNOSIS:  Abnormal stress test  POST-OPERATIVE DIAGNOSIS:  *Multivessel CAD  PROCEDURE:  Procedure(s): LEFT HEART CATH AND CORS/GRAFTS ANGIOGRAPHY (N/A)  SURGEON:  Surgeons and Role:    * Anice Wilshire, MD - Primary  FINDINGS: Severe 2-vessel CAD including 95% mid LAD ISR and subtotal occlusion of mid RCA. Widely patent LIMA-D and SVG-rPDA. Occluded SVG-LAD. Mildly elevated LVEDP.  RECOMMENDATIONS: Add isosorbide mononitrate for antianginal therapy. If the patient has ongoing sx, may need to consider PCI to ISR of LAD. Continue aggressive secondary prevention of CAD.  Yvonne Kendall, MD [facility]

## 2023-02-03 NOTE — Interval H&P Note (Signed)
History and Physical Interval Note:  02/03/2023 11:03 AM  Logan Crawford  has presented today for surgery, with the diagnosis of chest pain an abnormal stress test.  The various methods of treatment have been discussed with the patient and family. After consideration of risks, benefits and other options for treatment, the patient has consented to  Procedure(s): LEFT HEART CATH AND CORS/GRAFTS ANGIOGRAPHY (N/A) as a surgical intervention.  The patient's history has been reviewed, patient examined, no change in status, stable for surgery.  I have reviewed the patient's chart and labs.  Questions were answered to the patient's satisfaction.    Cath Lab Visit (complete for each Cath Lab visit)  Clinical Evaluation Leading to the Procedure:   ACS: No.  Non-ACS:    Anginal Classification: CCS IV  Anti-ischemic medical therapy: Minimal Therapy (1 class of medications)  Non-Invasive Test Results: Intermediate-risk stress test findings: cardiac mortality 1-3%/year  Prior CABG: Previous CABG  Logan Crawford

## 2023-02-04 ENCOUNTER — Encounter: Payer: Self-pay | Admitting: Internal Medicine

## 2023-02-06 ENCOUNTER — Ambulatory Visit: Payer: Self-pay | Admitting: Emergency Medicine

## 2023-02-06 ENCOUNTER — Telehealth: Payer: Self-pay

## 2023-02-06 NOTE — Heart Team MDD (Signed)
   Heart Team Multi-Disciplinary Discussion  Patient: Logan Crawford  DOB: 1976-01-13  MRN: [id number]   Date: 02/06/2023  9:58 AM    Attendees: Interventional Cardiology: Yates Decamp, MD Peter Swaziland, MD Verne Carrow, MD Yvonne Kendall, MD Dorothyann Peng, MD  Cardiothoracic Surgery: Brynda Greathouse, MD     Patient History: 47 y.o. male for pre op evaluation. Pending excision of scrotal sebaceous cyst. He reports occasional chest pain that he is not sure if it is reflux, typically at night. No chest pain with exertion. He has been more dyspneic with less exertion in the last couple of months. He denies palpitations, lower extremity edema, orthopnea or PND. He admits to using cocaine 1 time a month ago.       Risk Factors: Hypertension Hyperlipidemia Chronic Kidney Disease Tobacco Abuse Additional Risk Factors: chronic diastolic heart failure, polysubstance abuse   CAD History: -LHC 01/24/2020 (NSTEMI): Ostial to proximal RCA 60%.  Proximal to mid RCA 40%.  Mid LAD 90%.  PCI with DES 2.75 x 22 mm to mid LAD.  Mildly reduced LV systolic function with EF 45% with moderate to severe anteroapical hypokinesis.  Moderately elevated LVEDP. -LHC 05/20/2021 (NSTEMI): Distal LAD 40%.  Proximal to mid RCA 40%.  Ostial proximal RCA 50%.  RPDA 40%.  Ostial LM 30%.  Mid LAD 85% distal edge in-stent restenosis with extensive plaque distal to the stent involving the whole mid segment.  D2 60%.  PCI with DES 2.75 x 34 mm to mid LAD.  Balloon angioplasty to ostial D2. -LHC 11/18/2021 (unstable angina): Ostial 30%.  Distal LAD 40%.  Ostial to proximal RCA 50%.  Proximal mid RCA 40%.  D2 30%.  RPDA 40%.  Mid LAD #1 80%, #3 60%, mid RCA 70%.  Patent overlapping LAD stents with evidence of significant in-stent restenosis in the proximal segment with borderline stenosis distal to the stent.   -LHC 02/13/2022: Proximal and distal LAD stent: Proximal to mid LAD 70%, jailed D2 80%.  Mid LAD  #1 80%, #2 50%, distal LAD had distal stent edge 99%.  Mid RCA #1 70%, #2 95%.  RVEF 60%.  RPDA 60%.  Recommend transfer to [facility] for CTS consult. -CABG x 3 02/19/2022: LIMA to diagonal, SVG to LAD, SVG to RCA.  LAA closure.     Review of Prior Angiography and PCI Procedures: Left heart cath and cors/grafts angiography images from 02/03/2023 reviewed and discussed in detail including: Severe two-vessel native coronary artery disease with up to 99% in-stent restenosis of the LAD and multifocal RCA disease of up to 95%.  Overall appearance is similar to pre-CABG native CAD. Widely patent LIMA-D2 and SVG-RCA. Occluded SVG-LAD.    Discussion: After presentation, consideration of treatment options occurred contrasting medical management with PCI. After thorough discussion, team consensus was for medical management of patient.    Recommendations: Medical Therapy      Alison Murray, RN  02/06/2023 9:58 AM

## 2023-02-06 NOTE — Telephone Encounter (Signed)
Required Documents faxed on 02/06/23 to [phone]

## 2023-02-12 ENCOUNTER — Ambulatory Visit
Admission: RE | Admit: 2023-02-12 | Discharge: 2023-02-12 | Disposition: A | Discharge status: Home / Self Care | Payer: Medicaid Other | Source: Ambulatory Visit | Attending: Internal Medicine | Admitting: Internal Medicine

## 2023-02-12 DIAGNOSIS — I340 Nonrheumatic mitral (valve) insufficiency: Secondary | ICD-10-CM | POA: Diagnosis not present

## 2023-02-12 DIAGNOSIS — E785 Hyperlipidemia, unspecified: Secondary | ICD-10-CM | POA: Diagnosis not present

## 2023-02-12 DIAGNOSIS — I110 Hypertensive heart disease with heart failure: Secondary | ICD-10-CM | POA: Insufficient documentation

## 2023-02-12 DIAGNOSIS — I5032 Chronic diastolic (congestive) heart failure: Secondary | ICD-10-CM | POA: Diagnosis present

## 2023-02-12 LAB — ECHOCARDIOGRAM COMPLETE
AR max vel: 3.03 cm2
AV Area VTI: 3.43 cm2
AV Area mean vel: 3.09 cm2
AV Mean grad: 3 mm[Hg]
AV Peak grad: 4.7 mm[Hg]
Ao pk vel: 1.08 m/s
Area-P 1/2: 3.16 cm2
MV VTI: 2.86 cm2
S' Lateral: 2.2 cm

## 2023-02-12 NOTE — Progress Notes (Signed)
*  PRELIMINARY RESULTS* Echocardiogram 2D Echocardiogram has been performed.  Cristela Blue 02/12/2023, 10:49 AM

## 2023-02-12 NOTE — Progress Notes (Signed)
Cardiology Clinic Note   Date: 02/13/2023 ID: Kardin, Kravec 1975/07/17, MRN [id number]  Primary Cardiologist:  Debbe Odea, MD  Patient Profile    Logan Crawford is a 47 y.o. male who presents to the clinic today for follow up after LHC.     Past medical history significant for: CAD. LHC 01/24/2020 (NSTEMI): Ostial to proximal RCA 60%.  Proximal to mid RCA 40%.  Mid LAD 90%.  PCI with DES 2.75 x 22 mm to mid LAD.  Mildly reduced LV systolic function with EF 45% with moderate to severe anteroapical hypokinesis.  Moderately elevated LVEDP. LHC 05/20/2021 (NSTEMI): Distal LAD 40%.  Proximal to mid RCA 40%.  Ostial proximal RCA 50%.  RPDA 40%.  Ostial LM 30%.  Mid LAD 85% distal edge in-stent restenosis with extensive plaque distal to the stent involving the whole mid segment.  D2 60%.  PCI with DES 2.75 x 34 mm to mid LAD.  Balloon angioplasty to ostial D2. LHC 11/18/2021 (unstable angina): Ostial 30%.  Distal LAD 40%.  Ostial to proximal RCA 50%.  Proximal mid RCA 40%.  D2 30%.  RPDA 40%.  Mid LAD #1 80%, #3 60%, mid RCA 70%.  Patent overlapping LAD stents with evidence of significant in-stent restenosis in the proximal segment with borderline stenosis distal to the stent.  Recommended CABG versus balloon angioplasty given patient's poor compliance with medical therapy and continued cocaine use. LHC 02/13/2022: Proximal and distal LAD stent: Proximal to mid LAD 70%, jailed D2 80%.  Mid LAD #1 80%, #2 50%, distal LAD had distal stent edge 99%.  Mid RCA #1 70%, #2 95%.  RVEF 60%.  RPDA 60%.  Recommend transfer to [facility] for CTS consult. CABG x 3 02/19/2022: LIMA to diagonal, SVG to LAD, SVG to RCA.  LAA closure. LHC 02/03/2023 (abnormal stress test): Severe two-vessel native CAD with up to 99% in-stent restenosis of the LAD and multifocal RCA disease of up to 95%.  Overall.  Similar to pre-CABG native CAD.  Widely patent LIMA to D2 and SVG to RCA.  Occluded SVG to LAD.  Recommend  addition of isosorbide.  If refractory symptoms consider PTCA/PCI of LAD in-stent restenosis. Chronic diastolic heart failure. Echo 02/12/2023: EF 55 to 60%.  No RWMA.  Mild LVH.  Grade II DD.  Normal RV size/function.  Mild MR.   Hypertension. Hyperlipidemia. Lipid panel 05/14/2022: LDL 46, HDL 26, TG 331, total 138. GERD. Emphysema. OSA.  CKD stage IIIa. Tobacco abuse. Polysubstance abuse. Medication nonadherence.  In summary, patient has an extensive cardiac history with multiple PCI and CABG as detailed.  He was admitted to the hospital in October 2021 with progressive weakness, fatigue, dyspnea, cough, diarrhea.  He was found to be COVID-positive.  Troponin peaked at 32.  Echo showed EF 50 to 55%, no RWMA, Grade I DD, normal RV size and function, no valvular abnormalities.  He was readmitted November 2021 with NSTEMI, troponin peaked at 3520.  Echo at that time showed EF 50%, moderate hypokinesis of the mid apical anteroseptal wall, mild LVH, diastolic dysfunction, normal RV size/function, mild aortic valve sclerosis without stenosis.  He underwent successful PCI with DES to mid LAD.  Lexiscan October 2022 showed a small region of ischemia in the apical wall MI is overall low risk.    He was admitted to the hospital March 2023 with chest pain, troponin peaked at 125.  UDS positive for cocaine.  Echo showed normal LV/RV function, hypokinesis of the  mid to distal anteroseptal/septal and apical region, moderate LVH, Grade I DD, mild MR, aortic valve sclerosis without stenosis.  LHC demonstrated in-stent restenosis as detailed above patient underwent PCI with DES and balloon angioplasty.    He was admitted September 2023 with unstable angina.  He had not been taking his medications.  UDS positive for cocaine.  Echo showed EF 60 to 65%, no RWMA, moderate concentric LVH, Grade I DD, normal RV size/function, trivial MR, aortic valve sclerosis without stenosis.  LHC showed significant in-stent  restenosis in the proximal segment of overlapping LAD stents.  IC NTG improved overall appearance of coronary arteries and intervention was deferred secondary to patient's poor compliance and continued cocaine use.  Antianginal therapy was optimized with plan for revascularization if patient remains symptomatic.  He was admitted to the hospital December 2023 with NSTEMI in the setting of continued cocaine use and nonadherence with antiplatelet therapy.  Echo showed normal LV/RV function, no RWMA, mild MR.  LHC showed 2 potential culprit lesion including mid to distal RCA and mid to distal LAD.  He was transferred to [facility] and underwent CABG x 3.  Intraoperative course complicated by hyperkalemia bradycardic arrest prior to initiation of cardiopulmonary bypass with requirement of IABP as well as inotropic and vasopressor support which was continued postoperatively.  Posthospitalization course complicated by left pleural effusion requiring thoracentesis.      History of Present Illness    Logan Crawford is followed by Dr. Azucena Cecil for the above outlined history.   Patient was last seen in the office by me on 01/21/2023 for preoperative risk assessment.  He was pending excision of scrotal sebaceous cyst.  He complained of chest pain and DOE.  He reported not taking losartan or metoprolol for 2 months prior.  EKG at that time showed more prominent T wave inversion in lateral leads when compared to prior EKG.  Lexiscan was ordered and showed moderate in size, moderate in severity, reversible defect involving the apex concerning for ischemia, intermediate risk study.  Patient underwent LHC which showed severe two-vessel native CAD with up to 99% in-stent restenosis of the LAD and multifocal RCA disease up to 95%.  Patent LIMA to D2 and SVG to RCA, occluded SVG to LAD.  Patient was started on isosorbide.  Recommend consideration of PTCA/PCI to in-stent restenosis of LAD if refractory symptoms.  Today,  patient is doing well. He reports no episodes of chest pain since discharge from hospital. He still has some dyspnea but it is somewhat improved. Patient denies lower extremity edema, orthopnea or PND. No palpitations. He is able to do his normal activities. He is hoping to get back to the gym. He would like to proceed with his surgery if possible.    CAD/EKG changes Extensive history with multiple PCI. CABG x 26 January 2022. Patient presented for pre-op evaluation and EKG showed more prominent T-Wave inversion in lateral leads when compared to prior EKG. He complained of more dyspnea with activity Lexiscan showed evidence of ischemia. He underwent LHC which showed severe two-vessel native CAD with up to 99% in-stent restenosis of the LAD and multifocal RCA disease up to 95%.  Patent LIMA to D2 and SVG to RCA, occluded SVG to LAD.  Patient was started on isosorbide.  Recommend consideration of PTCA/PCI to in-stent restenosis of LAD if refractory symptoms. Patient reports feeling improved since starting isosorbide. Left wrist cath site is well healed with no tenderness, ecchymosis, erythema, or drainage. -Advance physical activity as  tolerated.   -Continue aspirin, Plavix, rosuvastatin, Toprol, isosorbide, as needed SL NTG.   Chronic diastolic heart failure Echo December 2024 showed normal LV/RV function, mild LVH, Grade II DD, mild MR.  Patient denies lower extremity edema, orthopnea or PND. He reports dyspnea is improving.  Euvolemic and well compensated on exam. -Continue Jardiance, furosemide, spironolactone, isosorbide, Toprol and losartan.   Hypertension BP today 110/70.  -Continue losartan, Toprol, spironolactone.   Hyperlipidemia LDL March 2024 46, at goal. -Continue rosuvastatin.  Preoperative cardiovascular risk assessment.  Patient is pending excision of scrotal sebaceous cyst. According to the RCRI, patient has a 6.6% risk of MACE. Patient reports activity equivalent to >4.0 METS.  Patient without chest pain and improving dyspnea since starting isosorbide.   -Based on ACC/AHA guidelines, Logan Crawford would be at a moderate but acceptable risk for the planned procedure without further cardiovascular testing.  -Per Dr. Azucena Cecil: "Okay to stop antiplatelets (Plavix) for 5 days prior to procedure.  Restart as soon as safely possible after surgical procedure."  -Ideally aspirin should be continued without interruption, however if the bleeding risk is too great, aspirin may be held for 5-7 days prior to surgery. Please resume aspirin post operatively when it is felt to be safe from a bleeding standpoint.     ROS: All other systems reviewed and are otherwise negative except as noted in History of Present Illness.  Studies Reviewed    EKG Interpretation Date/Time:  Friday February 13 2023 15:41:27 EST Ventricular Rate:  84 PR Interval:  158 QRS Duration:  80 QT Interval:  392 QTC Calculation: 463 R Axis:   61  Text Interpretation: Normal sinus rhythm Cannot rule out Inferior infarct (cited on or before 20-Jun-2022) Possible Anterior infarct (cited on or before 20-Jun-2022) When compared with ECG of 21-Jan-2023 15:17, T-waves no longer inverted in lateral leads Confirmed by Logan Crawford [id number]) on 02/13/2023 3:47:20 PM           Physical Exam    VS:  BP 110/70 (BP Location: Left Arm, Patient Position: Sitting, Cuff Size: Normal)   Pulse 84   Ht 6' (1.829 m)   Wt 244 lb (110.7 kg)   SpO2 97%   BMI 33.09 kg/m  , BMI Body mass index is 33.09 kg/m.  GEN: Well nourished, well developed, in no acute distress. Neck: No JVD or carotid bruits. Cardiac:  RRR. No murmurs. No rubs or gallops.   Respiratory:  Respirations regular and unlabored. Clear to auscultation without rales, wheezing or rhonchi. GI: Soft, nontender, nondistended. Extremities: Radials/DP/PT 2+ and equal bilaterally. No clubbing or cyanosis. No edema.  Skin: Warm and dry, no rash. Neuro:  Strength intact.  Assessment & Plan   CAD Extensive history with multiple PCI. CABG x 26 January 2022. Patient presented for pre-op evaluation and EKG showed more prominent T-Wave inversion in lateral leads when compared to prior EKG. He complained of more dyspnea with activity Lexiscan showed evidence of ischemia. He underwent LHC which showed severe two-vessel native CAD with up to 99% in-stent restenosis of the LAD and multifocal RCA disease up to 95%.  Patent LIMA to D2 and SVG to RCA, occluded SVG to LAD.  Patient was started on isosorbide.  Recommend consideration of PTCA/PCI to in-stent restenosis of LAD if refractory symptoms. Patient reports feeling improved since starting isosorbide. Left wrist cath site is well healed with no tenderness, ecchymosis, erythema, or drainage. EKG shows T waves are no longer inverted in lateral leads.  -Advance physical  activity as tolerated.   -Continue aspirin, Plavix, rosuvastatin, Toprol, isosorbide, as needed SL NTG.   Chronic diastolic heart failure Echo December 2024 showed normal LV/RV function, mild LVH, Grade II DD, mild MR.  Patient denies lower extremity edema, orthopnea or PND. He reports dyspnea is improving.  Euvolemic and well compensated on exam. -Continue Jardiance, furosemide, spironolactone, isosorbide, Toprol and losartan.   Hypertension BP today 110/70.  -Continue losartan, Toprol, spironolactone.   Hyperlipidemia LDL March 2024 46, at goal. -Continue rosuvastatin.  Preoperative cardiovascular risk assessment.  Patient is pending excision of scrotal sebaceous cyst. According to the RCRI, patient has a 6.6% risk of MACE. Patient reports activity equivalent to >4.0 METS. Patient without chest pain and improving dyspnea since starting isosorbide.   -Based on ACC/AHA guidelines, Logan Crawford would be at a moderate but acceptable risk for the planned procedure without further cardiovascular testing.  -Ideally aspirin should be  continued without interruption, however if the bleeding risk is too great, aspirin may be held for 5-7 days prior to surgery. Please resume aspirin post operatively when it is felt to be safe from a bleeding standpoint.    Disposition: Return in 3 months or sooner as needed.          Signed, Etta Grandchild. Emilyann Banka, DNP, NP-C

## 2023-02-13 ENCOUNTER — Ambulatory Visit: Payer: Medicaid Other | Attending: Student | Admitting: Student

## 2023-02-13 ENCOUNTER — Encounter: Payer: Self-pay | Admitting: Student

## 2023-02-13 VITALS — BP 110/70 | HR 84 | Ht 72.0 in | Wt 244.0 lb

## 2023-02-13 DIAGNOSIS — I10 Essential (primary) hypertension: Secondary | ICD-10-CM | POA: Diagnosis not present

## 2023-02-13 DIAGNOSIS — I25810 Atherosclerosis of coronary artery bypass graft(s) without angina pectoris: Secondary | ICD-10-CM

## 2023-02-13 DIAGNOSIS — Z01810 Encounter for preprocedural cardiovascular examination: Secondary | ICD-10-CM

## 2023-02-13 DIAGNOSIS — R0609 Other forms of dyspnea: Secondary | ICD-10-CM

## 2023-02-13 DIAGNOSIS — I5032 Chronic diastolic (congestive) heart failure: Secondary | ICD-10-CM | POA: Diagnosis not present

## 2023-02-13 DIAGNOSIS — E785 Hyperlipidemia, unspecified: Secondary | ICD-10-CM

## 2023-02-13 NOTE — Patient Instructions (Signed)
Medication Instructions: Your physician recommends that you continue on your current medications as directed. Please refer to the Current Medication list given to you today.   *If you need a refill on your cardiac medications before your next appointment, please call your pharmacy*   Lab Work: None  If you have labs (blood work) drawn today and your tests are completely normal, you will receive your results only by: MyChart Message (if you have MyChart) OR A paper copy in the mail If you have any lab test that is abnormal or we need to change your treatment, we will call you to review the results.   Testing/Procedures: NONE     Follow-Up: At [facility], you and your health needs are our priority.  As part of our continuing mission to provide you with exceptional heart care, we have created designated Provider Care Teams.  These Care Teams include your primary Cardiologist (physician) and Advanced Practice Providers (APPs -  Physician Assistants and Nurse Practitioners) who all work together to provide you with the care you need, when you need it.  We recommend signing up for the patient portal called "MyChart".  Sign up information is provided on this After Visit Summary.  MyChart is used to connect with patients for Virtual Visits (Telemedicine).  Patients are able to view lab/test results, encounter notes, upcoming appointments, etc.  Non-urgent messages can be sent to your provider as well.   To learn more about what you can do with MyChart, go to [email or web address].    Your next appointment:   3 month(s)  Provider:   You may see Debbe Odea, MD or one of the following Advanced Practice Providers on your designated Care Team:   Nicolasa Ducking, NP Eula Listen, PA-C Cadence Fransico Michael, PA-C Charlsie Quest, NP Carlos Levering, NP

## 2023-02-19 ENCOUNTER — Telehealth: Payer: Self-pay

## 2023-02-19 ENCOUNTER — Other Ambulatory Visit: Payer: Self-pay

## 2023-02-19 DIAGNOSIS — N509 Disorder of male genital organs, unspecified: Secondary | ICD-10-CM

## 2023-02-19 DIAGNOSIS — L723 Sebaceous cyst: Secondary | ICD-10-CM

## 2023-02-19 NOTE — Progress Notes (Signed)
    [facility] Surgical Posting Form   Surgery Date: Date: 03/06/2023   Surgeon: Dr. Legrand Rams, MD   Inpt ( No  )   Outpt (Yes)   Obs ( No  )    Diagnosis: N50.9 Scrotal Lesion   -CPT: 55100, 55110   Surgery: Excision of Scrotal Lesion, Sebaceous Cyst Removal   Stop Anticoagulations: Yes and also hold ASA   Cardiac/Medical/Pulmonary Clearance needed: yes   Clearance needed from Dr: Gala Romney, MD, received clearance on 02/13/2023. Patient to hold Plavix for 5 days prior to surgery.    *Orders entered into EPIC  Date: 02/19/2023  *Case booked in [place]  Date: 02/19/2023   *Notified pt of Surgery: Date: 02/19/2023   PRE-OP UA & CX: no   *Placed into Prior Authorization Work Que Date: 02/19/2023   Assistant/laser/rep:No

## 2023-02-19 NOTE — Telephone Encounter (Signed)
  Per Dr. Richardo Hanks, Patient is to be scheduled for Excision of Scrotal Lesion, Sebaceous Cyst Removal   Logan Crawford was contacted and possible surgical dates were discussed, Friday January 10th, 2025 was agreed upon for surgery.   Patient was directed to call [phone] between 1-3pm the day before surgery to find out surgical arrival time.  Instructions were given not to eat or drink from midnight on the night before surgery and have a driver for the day of surgery. On the surgery day patient was instructed to enter through the [facility] entrance of [facility] report the Same Day Surgery desk.   Pre-Admit Testing will be in contact via phone to set up an interview with the anesthesia team to review your history and medications prior to surgery.   Reminder of this information was sent via MyChart to the patient.   Patient is to hold anticoag's per Dr. Richardo Hanks.  Currently taking Plavix by [facility].  Clearance requested to stop 5days prior to surgery, clearance form was faxed to [facility]. Received clearance and patient may proceed with surgery. Last dosage of Plavix to be 02/28/2023, patient aware and verbalized understanding.

## 2023-02-23 ENCOUNTER — Other Ambulatory Visit: Payer: Self-pay | Admitting: Internal Medicine

## 2023-02-23 ENCOUNTER — Other Ambulatory Visit: Payer: Self-pay | Admitting: Nurse Practitioner

## 2023-02-23 DIAGNOSIS — K219 Gastro-esophageal reflux disease without esophagitis: Secondary | ICD-10-CM

## 2023-02-26 ENCOUNTER — Other Ambulatory Visit: Payer: Self-pay

## 2023-02-26 ENCOUNTER — Encounter
Admission: RE | Admit: 2023-02-26 | Discharge: 2023-02-26 | Disposition: A | Discharge status: Home / Self Care | Payer: Medicaid Other | Source: Ambulatory Visit | Attending: Urology | Admitting: Urology

## 2023-02-26 NOTE — Patient Instructions (Addendum)
 Your procedure is scheduled nw:[id number] January 10  Report to the Registration Desk on the 1st floor of the [facility] To find out your arrival time, please call [phone] between 1PM - 3PM on: Thursday January 9 If your arrival time is 6:00 am, do not arrive before that time as the [facility] entrance doors do not open until 6:00 am.  REMEMBER: Instructions that are not followed completely may result in serious medical risk, up to and including death; or upon the discretion of your surgeon and anesthesiologist your surgery may need to be rescheduled.  Do not eat food after midnight the night before surgery.  No gum chewing or hard candies.  One week prior to surgery:  Friday January 3  Stop Anti-inflammatories (NSAIDS) such as Advil, Aleve, Ibuprofen, Motrin, Naproxen, Naprosyn and Aspirin  based products such as Excedrin, Goody's Powder, BC Powder. Stop ANY OVER THE COUNTER supplements until after surgery.  You may however, continue to take Tylenol  if needed for pain up until the day of surgery.  **Follow guidelines for insulin  and diabetes medications.** empagliflozin  (JARDIANCE ) hold 1 day prior to surgery, last dose Wednesday January 8   **Follow recommendations regarding stopping blood thinners.** Hold clopidogrel  Plavix  for 5 days prior to surgery per Cardiology. Your last dosage of this will be on 02/28/2023.   Continue taking all of your other prescription medications up until the day of surgery.  ON THE DAY OF SURGERY ONLY TAKE THESE MEDICATIONS WITH SIPS OF WATER :  pantoprazole  (PROTONIX )  gabapentin  (NEURONTIN ) if needed for pain isosorbide  mononitrate (IMDUR )  metoprolol  succinate (TOPROL -XL)  rosuvastatin  (CRESTOR )   Use inhalers on the day of surgery and bring to the hospital. Fluticasone -Umeclidin-Vilant (TRELEGY ELLIPTA )  VENTOLIN  HFA   No Alcohol for 24 hours before or after surgery.  No Smoking including e-cigarettes for 24 hours before surgery.  No  chewable tobacco products for at least 6 hours before surgery.  No nicotine  patches on the day of surgery.  Do not use any recreational drugs for at least a week (preferably 2 weeks) before your surgery.  Please be advised that the combination of cocaine and anesthesia may have negative outcomes, up to and including death. If you test positive for cocaine, your surgery will be cancelled.  On the morning of surgery brush your teeth with toothpaste and water , you may rinse your mouth with mouthwash if you wish. Do not swallow any toothpaste or mouthwash.  Do not wear jewelry, make-up, hairpins, clips or nail polish.  For welded (permanent) jewelry: bracelets, anklets, waist bands, etc.  Please have this removed prior to surgery.  If it is not removed, there is a chance that hospital personnel will need to cut it off on the day of surgery.  Do not wear lotions, powders, or perfumes.   Do not shave body hair from the neck down 48 hours before surgery.  Contact lenses, hearing aids and dentures may not be worn into surgery.  Do not bring valuables to the hospital. [facility] is not responsible for any missing/lost belongings or valuables.   Notify your doctor if there is any change in your medical condition (cold, fever, infection).  Wear comfortable clothing (specific to your surgery type) to the hospital.  After surgery, you can help prevent lung complications by doing breathing exercises.  Take deep breaths and cough every 1-2 hours.  If you are being discharged the day of surgery, you will not be allowed to drive home. You will need a responsible  individual to drive you home and stay with you for 24 hours after surgery.   If you are taking public transportation, you will need to have a responsible individual with you.  Please call the Pre-admissions Testing Dept. at [phone] if you have any questions about these instructions.  Surgery Visitation Policy:  Patients having  surgery or a procedure may have two visitors.  Children under the age of 60 must have an adult with them who is not the patient.

## 2023-03-04 ENCOUNTER — Encounter: Payer: Self-pay | Admitting: Urology

## 2023-03-04 NOTE — Progress Notes (Addendum)
 Perioperative / Anesthesia Services  Pre-Admission Testing Clinical Review / Pre-Operative Anesthesia Consult  Date: 03/04/23  Patient Demographics:  Name: Logan Crawford DOB: [birth year] MRN:   [id number]  Planned Surgical Procedure(s):    Case: [id number] Date/Time: 03/06/23 1049   Procedure: EXCISION OF SCROTUM LESION, SEBACEOUS CYST   Anesthesia type: General   Pre-op diagnosis: Scrotal Lesion   Location: [facility] OR ROOM 06 / [facility]   Surgeons: Francisca Redell JAYSON, MD      NOTE: Available PAT nursing documentation and vital signs have been reviewed. Clinical nursing staff has updated patient's PMH/PSHx, current medication list, and drug allergies/intolerances to ensure comprehensive history available to assist in medical decision making as it pertains to the aforementioned surgical procedure and anticipated anesthetic course. Extensive review of available clinical information personally performed. [facility] PMH and PSHx updated with any diagnoses/procedures that  may have been inadvertently omitted during his intake with the pre-admission testing department's nursing staff.  Clinical Discussion:  Logan Crawford is a 48 y.o. male who is submitted for pre-surgical anesthesia review and clearance prior to him undergoing the above procedure. . Patient is a Current Smoker (10 pack years). Pertinent PMH includes: CAD (s/p CABG), NSTEMI x 2, perioperative complete heart block (related to hyperkalemia), ischemic cardiomyopathy, HFpEF, angina, aortic atherosclerosis, HTN, HLD, CKD-III, GERD (on daily PPI), hiatal hernia, postoperative BILATERAL pleural effusions (required LEFT thoracentesis), COPD, OSAH (requires nocturnal PAP therapy), hiatal hernia, scrotal lesion, polysubstance abuse (cocaine + THC + opiates + tobacco).   Patient is followed by cardiology Thera, MD). He was last seen in the cardiology clinic on 02/13/2023; notes reviewed. At the time of his clinic  visit, patient doing well overall from a cardiovascular perspective.  Patient did complain of some chronic dyspnea that was reported to be stable and at baseline.  Patient denied any chest pain, shortness of breath, PND, orthopnea, palpitations, significant peripheral edema, weakness, fatigue, vertiginous symptoms, or presyncope/syncope. Patient with a past medical history significant for cardiovascular diagnoses. Documented physical exam was grossly benign, providing no evidence of acute exacerbation and/or decompensation of the patient's known cardiovascular conditions.  Patient suffered an NSTEMI on 01/23/2020.  Troponins were trended: 3520 --> 2990 ng/L.  Diagnostic LEFT heart catheterization was performed on 01/14/2020 revealing multivessel CAD; 60% ostial proximal RCA, 40% proximal and mid RCA, 90% mid LAD, and 40% distal LAD.  PCI was subsequently performed placing a 2.75 x 22 mm Resolute Onyx DES x 1 to the mid LAD lesion.  Procedure yielded excellent angiographic result and TIMI-3 flow.  Patient suffered a second NSTEMI on 05/18/2021.  Troponins were trended: 101 --> 110 --> 175 ng/L.  Diagnostic LEFT heart catheterization was performed on 05/20/2021 revealing multivessel CAD; 40% distal LAD, 20% proximal to mid RCA, 50% ostial to proximal RCA, 40% RPDA, 30% ostial LM, 85% mid LAD with distal edge dissection, and 60% D2.  PCI was subsequently performed placing overlapping 2.75 x 34 mm and 2.5 x 8 mm Onyx Frontier DES x 2 to the mid LAD lesion yielding excellent angiographic result and TIMI-3 flow.  Patient with recurrent anginal symptoms requiring repeat diagnostic LEFT heart catheterization on 11/18/2021.  Study revealed multivessel CAD; 30% ostial LM, 40% distal LAD, 50% ostial to proximal RCA, 40% proximal to mid RCA, 30% D2, 40% RPDA, 80% mid LAD-1, 60% mid LAD-3, and 70% mid RCA.  Previously placed overlapping stents within the LAD noted to be widely patent.  Intracoronary nitroglycerin  was  administered improving  overall appearance of the coronary arteries.  Cardiology noted that this would be the second in-stent restenosis of the LAD.  Management options were considered including revascularization versus stent POBA.  Plans were to discuss with patient following conclusion of the procedure.  As previously mentioned, patient with 2 episodes of in-stent restenosis of the LAD.  Management options were discussed, and patient elected to go with less invasive angioplasty route.  Repeat diagnostic LEFT heart catheterization was performed on 02/13/2022 revealing significant multivessel coronary artery disease with significant in-stent restenosis within the LAD.  Given the degree and complexity of patient's coronary artery disease and multiple areas of in-stent restenosis, coupled with 2 previous episodes of in-stent restenosis with subsequent intervention, the decision was made to refer to CVTS for consideration of revascularization.   Patient underwent three-vessel CABG procedure on 02/19/2022.  LIMA-D1, SVG-LAD, and SVG-PDA bypass grafts were placed.  LAA closure was also simultaneously performed.  Procedure was complicated by perioperative hyperkalemia. Patient developed complete heart block.  Etiology unknown. 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  Patient experiencing recent episodes of increasing chest pain and shortness of breath.  Episodes questionably related to recent cocaine use.  He underwent myocardial perfusion imaging study on 01/27/2023 revealing a mildly reduced left ventricular systolic function with an EF of 45-50%.  There was a moderate in size, moderate in severity, reversible defect involving the apex noted.  Findings concerning for ischemia, however cardiology noted that artifact could not be excluded.  Study was determined to be intermediate risk.  Cardiac catheterization was  recommended.  Patient underwent diagnostic LEFT heart catheterization on 02/03/2023 revealing multivessel CAD; 30% ostial LM, 70% ISR proximal-mid LAD, 80% mid LAD-1, 70% mid LAD-2, 99% distal LAD, 80% D2, 70% proximal RCA-1, 70% proximal RCA-2, 70% mid RCA-1, 95% mid RCA-2, 40% distal RCA, 60% RV branch, and 60% RPDA.  SVG-LAD occluded.  Remaining bypass grafts were noted to be patent.  Interventional cardiology made the recommendation to continue indefinite DAPT therapy.  Antianginal therapy with addition of long-acting nitrate was also suggested.  For refractory symptoms, PTCA/PCI of the ISR within the LAD could be considered.  Patient to be further discussed at cardiovascular review board.  TTE performed on 02/12/2023 revealed normal left ventricular systolic function with an EF of 55 to 60%.  There were no regional wall motion abnormalities.  There was mild LVH. Left ventricular diastolic Doppler parameters consistent with pseudonormalization (G2DD).  Average GLS -10.5%.  Right ventricular size and function normal.  There was mild mitral valve regurgitation. All transvalvular gradients were noted to be normal providing no evidence suggestive of valvular stenosis. Aorta normal in size with no evidence of aneurysmal dilatation.  Given patient's significant cardiovascular history, patient remains on daily DAPT therapy using ASA + clopidogrel .  Patient reportedly compliant with therapy with no evidence or reports of GI/GU related bleeding. Blood pressure well controlled at 110/70 mmHg on currently prescribed diuretic (furosemide , nitrate (isosorbide  mononitrate), ARB (losartan ), and beta-blocker (metoprolol  succinate).  In addition to his prescribed scheduled nitrates, patient has a supply of short acting nitrates (NTG) to use on a as needed basis for refractory anginal symptoms; denied recent use.  Patient is also on an SGLT2i therapy (empagliflozin ) for added cardiovascular and renovascular protection.  Patient is on rosuvastatin  for his HLD diagnosis and ASCVD prevention. Patient is not diabetic. He does have an OSAH diagnosis and is reported be compliant with prescribed nocturnal PAP therapy. Patient  is able to complete all of his  ADL/IADLs without cardiovascular limitation.  Per the DASI, patient is able to achieve at least 4 METS of physical activity without experiencing any significant degree of angina/anginal equivalent symptoms. No changes were made to his medication regimen during his visit with cardiology.  Patient scheduled to follow-up with outpatient cardiology in 3 months or sooner if needed.  Logan Crawford is scheduled for an elective EXCISION OF SCROTUM LESION, SEBACEOUS CYST on 03/06/2023 with Dr. Redell Burnet, MD.  Given patient's past medical history significant for cardiovascular diagnoses, presurgical cardiac clearance was sought by the PAT team.  Per cardiology, according to the [facility], patient has a 6.6% risk of MACE. Patient reports activity equivalent to >4.0 METS. Patient without chest pain and improving dyspnea since starting isosorbide . Based on ACC/AHA guidelines, Logan Crawford would be at a MODERATE/ACCEPTABLE risk for the planned procedure without further cardiovascular testing.  Again, this patient is on daily DAPT therapy.  He has been instructed on recommendations from his cardiologist for holding his clopidogrel  for 5 days prior to his procedure with plans to restart as soon as postoperative bleeding risk felt to be minimized by his primary attending surgeon.  The patient is aware that his last dose of his DAPT medications should be on 02/28/2023.  Patient reports previous perioperative complications with anesthesia in the past.  Patient experienced (+) significant perioperative HYPERkalemia during his CABG at [facility] in 01/2022 (ASA IV). Preoperative K+ was 4.6 mmol/L with a creatinine of 1.3 mg/dL.  Prior to being placed on cardiopulmonary bypass,  patient with significant elevation of his K+ level.  Initial consideration was atypical malignant hyperthermia, thus patient was treated with intravenous dantrolene .  K+ level was / managed intraoperatively with furosemide  + insulin  + calcium . Additionally,  patient treated with extended dialysis equivalent via cardiopulmonary bypass pump (remained on pump 1 hour after procedure). Hyperkalemia persisted postoperatively, peaking at 7.8 mmol/L. Etiology of electrolyte derangement unknown.  Providers did consult with  malignant hyperthermia society and were advised that event was unlikely related to malignant hyperthermia.  No confirmatory CHCT testing was performed.  Patient was advised by providers that if he ever underwent anesthesia again, he should advise his anesthesia provider that he experienced an unexplained episode of perioperative hyperkalemia in the past.      02/26/2023    7:45 AM 02/13/2023    3:34 PM 02/03/2023    1:45 PM  Vitals with BMI  Height 6' 0 6' 0   Weight 243 lbs 13 oz 244 lbs   BMI 33.06 33.09   Systolic  110 112  Diastolic  70 88  Pulse  84 84   Providers/Specialists:  NOTE: Primary physician provider listed below. Patient may have been seen by APP or partner within same practice.   PROVIDER ROLE / SPECIALTY LAST SHERLEAN Burnet Redell JAYSON, MD Urology (Surgeon) 01/13/2023  Juanice Thomes SAUNDERS, FNP Primary Care Provider 10/20/2022  Darliss Redell, MD Cardiology 02/13/2023  Cherrie Sieving, MD Advanced Heart Failure 10/22/2022  Isadora Rabon, MD Pulmonary Medicine 08/06/2022   Allergies:   Allergies  Allergen Reactions   Bee Pollen     Patient states he is not allergic to bees but has seasonal allergies to pollen   Current Home Medications:   No current facility-administered medications for this encounter.    aspirin  EC 81 MG tablet   clopidogrel  (PLAVIX ) 75 MG tablet   empagliflozin  (JARDIANCE ) 10 MG TABS tablet   famotidine  (PEPCID )  20 MG tablet    Fluticasone -Umeclidin-Vilant (TRELEGY ELLIPTA ) 100-62.5-25 MCG/ACT AEPB   furosemide  (LASIX ) 40 MG tablet   gabapentin  (NEURONTIN ) 100 MG capsule   imiquimod  (ALDARA ) 5 % cream   isosorbide  mononitrate (IMDUR ) 30 MG 24 hr tablet   losartan  (COZAAR ) 50 MG tablet   metoprolol  succinate (TOPROL -XL) 25 MG 24 hr tablet   nitroGLYCERIN  (NITROSTAT ) 0.4 MG SL tablet   rosuvastatin  (CRESTOR ) 40 MG tablet   spironolactone  (ALDACTONE ) 25 MG tablet   VENTOLIN  HFA 108 (90 Base) MCG/ACT inhaler   NON FORMULARY   nystatin  (MYCOSTATIN /NYSTOP ) powder   pantoprazole  (PROTONIX ) 40 MG tablet   triamcinolone  (KENALOG ) 0.025 % cream   History:   Past Medical History:  Diagnosis Date   (HFpEF) heart failure with preserved ejection fraction (HCC)    a.) TTE 11/26/19: EF 50-55, G1DD; b.) TTE 01/24/20: EF 50, LVH, mid-apical antsep HK, G1DD; c.) TTE 05/18/21: EF 50-55, antsep/sep/apical HK, LVH, mild MR, AoV calc; d.) TTE 07/16/21: EF 60-65, LVH, G2DD, mild MD; e.) TTE 02/18/22: EF 55, sep/inf HK, LVH, G1DD, mild MR; f.) TTE 03/19/22: EF 65-70, antsep/periapical HK, LVH, G1DD, mild MR, AoV calc; g.) TTE 02/12/2023: EF 55-60, LVH, G2DD, mild MR   Anginal pain (HCC)    Aortic atherosclerosis (HCC)    Bilateral pleural effusion 02/20/2022   a.) s/p CABG and LAA closure (L > R) --> progressive during admission --> ultimately required LEFT thoracentesis on 03/10/2022 (750 cc serous yield)   CAD (coronary artery disease)    a.)PCI 01/24/20: 90 mLAD (2.75x22 Res Onyx); b.)PCI 05/20/21: 85 mLAD (2.75x24 + 2.5x8 Onyx); c.)LHC 11/18/21: 80 ISR + 60 mLAD - CVTS vs stent POBA; d.)LHC 02/13/22: 70 ISR p-mLAD, 80/50 ISR mLAD, 99 ISR dLAD, 80 jailed D2, diff HG RCA Dz; e.)3v CABG 02/19/22; f.)LHC 02/03/23: 30 LM, 70 p-mLAD, 80/70 mLAD, 99 dLAD, 80 D2, 70/70 pRCA, 70/95 mRCA, 40 dRCA, 60 RV branch, 60 RPDA, 100 SVG-LAD - Rx mgmt   CKD (chronic kidney disease), stage III (HCC)    Complete heart block (HCC) 02/19/2022   a.) during  CABG/LAA closure --> hyperkalemic CHB in OR before heart surgery had functionally begun (not on bypass yet); etiology unknown; ?? due to medication administration or contaminant given fairly normal preop renal function (Cr 1.3). Persisted post-bypass event with extensive dialysis equivalent via CPB pump --> Tx'd with dantrolene    Complication of anesthesia 02/19/2022   a.) perioperative HYPERkalemia - concern for atypical MH initially - Tx'd intraoperaitvely with dantrolene . Preop K+ 4.6 and CR 1.3 --> K+ peaked at 7.8 intra/postop --> rec'd furosemide  + insulin  + calcium  + extended CBP (remained on pump at least 1 hour after procedure). Consult with MH society determined unlikely MH (see Argwala, MD note 02/22/2022). No confirmatory CHCT testing.   COPD (chronic obstructive pulmonary disease) (HCC)    Genital warts    GERD (gastroesophageal reflux disease)    Hiatal hernia    History of 2019 novel coronavirus disease (COVID-19) 11/25/2019   History of left atrial appendage closure 02/19/2022   a.) performed simultaneously with cardiac revascularization (CABG) procedure on 02/19/2022   Hyperlipidemia    Hypertension    Ischemic cardiomyopathy    a.) TTE 11/26/19: EF 50-55; b.)TTE 01/24/20: EF 50; c.)LHC 01/24/20: EF 45-50; d.)MV 11/29/20: EF 43; e.)TTE 05/18/21: EF 50-55; f.)LHC 05/20/21: EF 50-55; g.)TTE 07/16/21: EF 60-65; h.) TTE 11/16/21: EF 60-65; i.) LHC 11/18/21: EF 55-65; j.) TTE 02/12/22: EF 60-65; k.) TTE 02/18/22: EF 55; l.) TTE 03/19/22:  EF 65-70; m.) TTE 05/01/22: EF 55; n.) MV 01/27/23: EF 45-50; o.) TTE 02/12/2023: EF 55-60   Leukocytosis    Long term current use of clopidogrel     Long-term use of aspirin  therapy    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   a.) surgical PCR (+) 02/15/2022 prior to CABG   NSTEMI (non-ST elevated myocardial infarction) (HCC) 01/23/2020   a.) troponins were trended: 3520 --> 2990 ng/L; b.) LHC/PCI 01/24/2020: 60% o-pRCA, 40% p-mRCA, 90% mLAD (2.75 x 22  mm Resolute Onyx DES), 40% dLAD   NSTEMI (non-ST elevated myocardial infarction) (HCC) 05/18/2021   a.) troponins were trended: 101 --> 110 --> 175 ng/L; b.) LHC/PCI 05/20/2021: 40% dLAD, 40% p-mRCA, 50% o-pRCA, 40% RPDA, 30% oLM, 85% mLAD with distal edge dissection (overlapping 2.75 x 34 mm and 2.5 x 8 mm Onyx Frontier DES), 60% D2   OSA on CPAP    Polysubstance abuse (HCC)    a.) cocaine, THC, opiates, tobacco   Postoperative cardiogenic shock (HCC) 02/19/2022   a.) required vasopressor/ionotropic agent support + IABP   Recurrent cellulitis of thigh    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 Consult with MH society determined MH unlikely (see Argwala, MD note 02/22/2022); b.) c/b postop cardiogenic shock req vasopressor/inotrophic support + IABP; c.) c/b dev of BILATERAL pleural effusions (L > R) req LEFT thoracentesis (750 cc yield)   Scrotal lesion (sebaceous cyst)    Tobacco use 11/25/2019   Past Surgical History:  Procedure Laterality Date   CORONARY ARTERY BYPASS GRAFT N/A 02/19/2022   Procedure: CORONARY ARTERY BYPASS GRAFTING (CABG) X THREE, USING ENDOSCOPICALLY HARVESTED RIGHT GREATER SAPHENOUS VEIN  AND LEFT ATRIAL APPENDAGE CLIPPING;  Surgeon: Murriel Toribio DEL, MD;  Location: [facility] OR;  Service: Open Heart Surgery;  Laterality: N/A;   CORONARY STENT INTERVENTION N/A 01/24/2020   Procedure: CORONARY STENT INTERVENTION;  Surgeon: Darron Deatrice LABOR, MD;  Location: [facility] INVASIVE CV LAB;  Service: Cardiovascular;  Laterality: N/A;   CORONARY STENT INTERVENTION N/A 05/20/2021   Procedure: CORONARY STENT INTERVENTION;  Surgeon: Darron Deatrice LABOR, MD;  Location: [facility] CV LAB;  Service: Cardiovascular;  Laterality: N/A;   CORONARY ULTRASOUND/IVUS N/A 05/20/2021   Procedure: Intravascular Ultrasound/IVUS;  Surgeon: Darron Deatrice LABOR, MD;  Location:  [facility] INVAS[facility] CV LAB;  Service: Cardiovascular;  Laterality: N/A;   IRRIGATION AND DEBRIDEMENT KNEE Left    LEFT ATRIAL APPENDAGE OCCLUSION Left 02/19/2022   Procedure: LEFT ATRIAL APPENDAGE OCCLUSION; Location: [facility]; Surgeon: Toribio Murriel, MD   LEFT HEART CATH AND CORONARY ANGIOGRAPHY N/A 01/24/2020   Procedure: LEFT HEART CATH AND CORONARY ANGIOGRAPHY;  Surgeon: Darron Deatrice LABOR, MD;  Location: [facility] CV LAB;  Service: Cardiovascular;  Laterality: N/A;   LEFT HEART CATH AND CORONARY ANGIOGRAPHY N/A 11/18/2021   Procedure: LEFT HEART CATH AND CORONARY ANGIOGRAPHY;  Surgeon: Darron Deatrice LABOR, MD;  Location: [facility] CV [facility];  Service: Cardiovascular;  Laterality: N/A;   LEFT HEART CATH AND CORONARY ANGIOGRAPHY N/A 02/13/2022   Procedure: LEFT HEART CATH AND CORONARY ANGIOGRAPHY;  Surgeon: Anner Alm ORN, MD;  Location: [facility] CV LAB;  Service: Cardiovascular;  Laterality: N/A;   LEFT HEART CATH AND CORS/GRAFTS ANGIOGRAPHY N/A 05/20/2021   Procedure: LEFT HEART CATH AND CORS/GRAFTS ANGIOGRAPHY;  Surgeon: Darron Deatrice LABOR, MD;  Location: [facility];  Service: Cardiovascular;  Laterality: N/A;  LEFT HEART CATH AND CORS/GRAFTS ANGIOGRAPHY N/A 02/03/2023   Procedure: LEFT HEART CATH AND CORS/GRAFTS ANGIOGRAPHY;  Surgeon: Mady Bruckner, MD;  Location: [facility] CV [facility];  Service: Cardiovascular;  Laterality: N/A;   TYMPANOSTOMY TUBE PLACEMENT Bilateral    Family History  Problem Relation Age of Onset   Coronary artery disease Mother    Arrhythmia Mother        Pacemaker placed in 2021   Heart failure Mother    Breast cancer Mother    Coronary artery disease Father    Colon cancer Father    Colon polyps Father    Social History   Tobacco Use   Smoking status: Every Day    Current packs/day: 1.00    Average packs/day: 1 pack/day for 10.1 years (10.1 ttl pk-yrs)    Types: Cigarettes    Start date: 02/12/2013    Passive exposure: Past    Smokeless tobacco: Never   Tobacco comments:    2-3 cigarettes daily- 08/06/2022        1/2 pack daily as of 01/2023  Substance Use Topics   Alcohol use: Yes    Alcohol/week: 1.0 standard drink of alcohol    Types: 1 Shots of liquor per week    Comment: socially   Pertinent Clinical Results:  LABS:  Lab Results  Component Value Date   WBC 10.4 01/27/2023   HGB 15.3 01/27/2023   HCT 46.5 01/27/2023   MCV 79.5 (L) 01/27/2023   PLT 348 01/27/2023   Lab Results  Component Value Date   NA 138 01/21/2023   K 4.7 01/21/2023   CO2 24 01/21/2023   GLUCOSE 80 01/21/2023   BUN 10 01/21/2023   CREATININE 1.41 (H) 01/21/2023   CALCIUM  9.9 01/21/2023   EGFR 61 01/21/2023   GFRNONAA 58 (L) 06/20/2022    ECG: Date: 02/13/2023  Time ECG obtained: 1541 PM Rate: 84 bpm Rhythm: normal sinus Axis (leads I and aVF): normal Intervals: PR 158 ms. QRS 80 ms. QTc 463 ms. ST segment and T wave changes: No evidence of acute T wave abnormalities or significant ST segment elevation or depression. Evidence of an age undetermined inferior and interior infarcts noted.  Comparison: Similar to previous tracing obtained on 01/21/2023   IMAGING / PROCEDURES: TRANSTHORACIC ECHOCARDIOGRAM performed on 02/12/2023 Left ventricular ejection fraction, by estimation, is 55 to 60%. The left ventricle has normal function. The left ventricle has no regional wall motion abnormalities. There is mild left ventricular hypertrophy. Left ventricular diastolic parameters are consistent with Grade II diastolic dysfunction (pseudonormalization). The average left ventricular global longitudinal strain is -10.5 %. Right ventricular systolic function is normal. The right ventricular size is normal.  The mitral valve is normal in structure. Mild mitral valve regurgitation. No evidence of mitral stenosis.  The aortic valve is normal in structure. Aortic valve regurgitation is not visualized. No aortic stenosis is present.  The  inferior vena cava is normal in size with greater than 50% respiratory variability, suggesting right atrial pressure of 3 mmHg.   LEFT HEART CATHETERIZATION AND CORONARY ANGIOGRAPHY performed on 02/03/2023 Severe two-vessel native coronary artery disease with up to 99% in-stent restenosis of the LAD and multifocal RCA disease of up to 95%.  Overall appearance is similar to pre-CABG native CAD. Widely patent LIMA-D2 and SVG-RCA. Occluded SVG-LAD. Mildly elevated left ventricular filling pressure (LVEDP 22 mmHg). Recommendations: Continue indefinite dual-antiplatelet therapy. Escalate antianginal therapy with addition of isosorbide  mononitrate 30 mg daily. If the patient has refractory symptoms,  PTCA/PCI of LAD in-stent restenosis would need to be considered.  Will review images at [facility] meeting. Follow-up in the office as planned on 02/13/2023 to assess response to antianginal therapy. Consider gentle diuresis if renal function remains stable.   MYOCARDIAL PERFUSION IMAGING STUDY (LEXISCAN ) performed on 01/27/2023 Mildly reduced left ventricular systolic function with an EF of 45-50% Left ventricular wall motion is globally hypokinetic Moderate in size, moderate in severity, reversible defect involving the apex concerning for ischemia, however cannot exclude an element of artifact. Attenuation correction CT images demonstrate the sequela of CABG, PCI, and LAA clipping Able to complete 50% of a 171 bpm MPHR (max heart rate 87 bpm).  Estimated workload 1 METS TID = 1.05 Patient complained of dyspnea and flushing during the stress test Abnormal intermediate risk pharmacologic myocardial perfusion stress test  PULMONARY FUNCTION TESTING performed on 06/20/2022    Latest Ref Rng & Units 06/20/2022    1:17 PM  PFT Results  FVC-Pre L 3.37   FVC-Predicted Pre % 61   FVC-Post L 3.57   FVC-Predicted Post % 65   Pre FEV1/FVC % % 74   Post FEV1/FCV % % 76   FEV1-Pre L 2.50   FEV1-Predicted  Pre % 58   FEV1-Post L 2.72   DLCO uncorrected ml/min/mmHg 22.30   DLCO UNC% % 70   DLVA Predicted % 107   TLC L 3.73   TLC % Predicted % 50   RV % Predicted % 21    CORONARY ARTERY BYPASS GRAFTING AND LEFT ATRIAL APPENDAGE CLOSURE performed on 02/19/2022 LAA clipping 3v CABG LIMA-D1 SVG-LAD SVG-PDA Procedure complicated by perioperative hyperkalemia.  Initially thought to be secondary to atypical malignant hyperthermia. Peak K+ 7.8 mmol/L.  Dialysis equivalent with cardiopulmonary bypass intraoperatively. Later discussions with MH society determine that MH unlikely. No HCT biopsy performed.  Postoperative cardiogenic shock requiring vasopressor/inotropic agent support and IABP. Postoperative development of BILATERAL pleural effusions (L > R), ultimately requiring LEFT thoracentesis (750 cc yield) on 03/10/2022.  Impression and Plan:  Logan Crawford has been referred for pre-anesthesia review and clearance prior to him undergoing the planned anesthetic and procedural courses. Available labs, pertinent testing, and imaging results were personally reviewed by me in preparation for upcoming operative/procedural course. [facility] medical record has been updated following extensive record review and patient interview with PAT staff.   This patient has been appropriately cleared by cardiology with an overall MODERATE/ACCEPTABLE risk of experiencing significant perioperative cardiovascular complications. Based on clinical review performed today (03/04/23), barring any significant acute changes in the patient's overall condition, it is anticipated that he will be able to proceed with the planned surgical intervention. Any acute changes in clinical condition may necessitate his procedure being postponed and/or cancelled. Patient will meet with anesthesia team (MD and/or CRNA) on the day of his procedure for preoperative evaluation/assessment. Questions regarding anesthetic course will be fielded  at that time.   Pre-surgical instructions were reviewed with the patient during his PAT appointment, and questions were fielded to satisfaction by PAT clinical staff. He has been instructed on which medications that he will need to hold prior to surgery, as well as the ones that have been deemed safe/appropriate to take on the day of his procedure. As part of the general education provided by PAT, patient made aware both verbally and in writing, that he would need to abstain from the use of any illegal substances during his perioperative course. He was advised that failure to follow the provided  instructions could necessitate case cancellation or result in serious perioperative complications up to and including death. Patient encouraged to contact PAT and/or his surgeon's office to discuss any questions or concerns that may arise prior to surgery; verbalized understanding.   Dorise Pereyra, MSN, APRN, FNP-C, CEN [facility]  Perioperative Services Nurse Practitioner Phone: [phone] Fax: [phone] 03/04/23 3:26 PM  NOTE: This note has been prepared using Dragon dictation software. Despite my best ability to proofread, there is always the potential that unintentional transcriptional errors may still occur from this process.

## 2023-03-06 ENCOUNTER — Encounter: Payer: Self-pay | Admitting: Urology

## 2023-03-06 ENCOUNTER — Ambulatory Visit
Admission: RE | Admit: 2023-03-06 | Discharge: 2023-03-06 | Disposition: A | Discharge status: Home / Self Care | Payer: Medicaid Other | Attending: Urology | Admitting: Urology

## 2023-03-06 ENCOUNTER — Ambulatory Visit: Payer: Medicaid Other | Admitting: Urgent Care

## 2023-03-06 ENCOUNTER — Encounter
Admission: RE | Disposition: A | Discharge status: Home / Self Care | Payer: Self-pay | Source: Home / Self Care | Attending: Urology

## 2023-03-06 ENCOUNTER — Other Ambulatory Visit: Payer: Self-pay

## 2023-03-06 DIAGNOSIS — J449 Chronic obstructive pulmonary disease, unspecified: Secondary | ICD-10-CM | POA: Diagnosis not present

## 2023-03-06 DIAGNOSIS — Z951 Presence of aortocoronary bypass graft: Secondary | ICD-10-CM | POA: Insufficient documentation

## 2023-03-06 DIAGNOSIS — I252 Old myocardial infarction: Secondary | ICD-10-CM | POA: Insufficient documentation

## 2023-03-06 DIAGNOSIS — I132 Hypertensive heart and chronic kidney disease with heart failure and with stage 5 chronic kidney disease, or end stage renal disease: Secondary | ICD-10-CM | POA: Diagnosis not present

## 2023-03-06 DIAGNOSIS — F17210 Nicotine dependence, cigarettes, uncomplicated: Secondary | ICD-10-CM | POA: Insufficient documentation

## 2023-03-06 DIAGNOSIS — Z955 Presence of coronary angioplasty implant and graft: Secondary | ICD-10-CM | POA: Insufficient documentation

## 2023-03-06 DIAGNOSIS — I25119 Atherosclerotic heart disease of native coronary artery with unspecified angina pectoris: Secondary | ICD-10-CM | POA: Diagnosis not present

## 2023-03-06 DIAGNOSIS — L723 Sebaceous cyst: Secondary | ICD-10-CM | POA: Insufficient documentation

## 2023-03-06 DIAGNOSIS — N183 Chronic kidney disease, stage 3 unspecified: Secondary | ICD-10-CM | POA: Diagnosis not present

## 2023-03-06 DIAGNOSIS — I5032 Chronic diastolic (congestive) heart failure: Secondary | ICD-10-CM | POA: Insufficient documentation

## 2023-03-06 DIAGNOSIS — N509 Disorder of male genital organs, unspecified: Secondary | ICD-10-CM

## 2023-03-06 HISTORY — PX: SCROTAL EXPLORATION: SHX2386

## 2023-03-06 SURGERY — EXPLORATION, SCROTUM
Anesthesia: General

## 2023-03-06 MED ORDER — LIDOCAINE HCL (PF) 1 % IJ SOLN
INTRAMUSCULAR | Status: AC
  Filled 2023-03-06: qty 30

## 2023-03-06 MED ORDER — TRAMADOL HCL 50 MG PO TABS: 25.0000 mg | ORAL_TABLET | Freq: Four times a day (QID) | ORAL | 0 refills | Status: AC | PRN

## 2023-03-06 MED ORDER — ONDANSETRON HCL 4 MG/2ML IJ SOLN: 4.0000 mg | Freq: Once | INTRAMUSCULAR | Status: DC | PRN

## 2023-03-06 MED ORDER — SUGAMMADEX SODIUM 200 MG/2ML IV SOLN
INTRAVENOUS | Status: DC | PRN
  Administered 2023-03-06: 220 mg via INTRAVENOUS

## 2023-03-06 MED ORDER — OXYCODONE HCL 5 MG PO TABS: 5.0000 mg | ORAL_TABLET | Freq: Once | ORAL | Status: DC | PRN

## 2023-03-06 MED ORDER — BUPIVACAINE HCL (PF) 0.5 % IJ SOLN
INTRAMUSCULAR | Status: AC
  Filled 2023-03-06: qty 30

## 2023-03-06 MED ORDER — CEFAZOLIN SODIUM-DEXTROSE 2-4 GM/100ML-% IV SOLN
INTRAVENOUS | Status: AC
  Filled 2023-03-06: qty 100

## 2023-03-06 MED ORDER — LIDOCAINE HCL (CARDIAC) PF 100 MG/5ML IV SOSY
PREFILLED_SYRINGE | INTRAVENOUS | Status: DC | PRN
  Administered 2023-03-06: 100 mg via INTRAVENOUS

## 2023-03-06 MED ORDER — FENTANYL CITRATE (PF) 100 MCG/2ML IJ SOLN
INTRAMUSCULAR | Status: DC | PRN
  Administered 2023-03-06 (×2): 50 ug via INTRAVENOUS

## 2023-03-06 MED ORDER — LACTATED RINGERS IV SOLN: INTRAVENOUS | Status: DC

## 2023-03-06 MED ORDER — PHENYLEPHRINE 80 MCG/ML (10ML) SYRINGE FOR IV PUSH (FOR BLOOD PRESSURE SUPPORT)
PREFILLED_SYRINGE | INTRAVENOUS | Status: DC | PRN
  Administered 2023-03-06: 160 ug via INTRAVENOUS
  Administered 2023-03-06: 80 ug via INTRAVENOUS
  Administered 2023-03-06: 160 ug via INTRAVENOUS
  Administered 2023-03-06: 80 ug via INTRAVENOUS

## 2023-03-06 MED ORDER — BUPIVACAINE HCL 0.5 % IJ SOLN
INTRAMUSCULAR | Status: DC | PRN
  Administered 2023-03-06: 8 mL

## 2023-03-06 MED ORDER — PHENYLEPHRINE 80 MCG/ML (10ML) SYRINGE FOR IV PUSH (FOR BLOOD PRESSURE SUPPORT)
PREFILLED_SYRINGE | INTRAVENOUS | Status: AC
  Filled 2023-03-06: qty 10

## 2023-03-06 MED ORDER — ALBUTEROL SULFATE HFA 108 (90 BASE) MCG/ACT IN AERS
INHALATION_SPRAY | RESPIRATORY_TRACT | Status: AC
  Filled 2023-03-06: qty 6.7

## 2023-03-06 MED ORDER — DEXMEDETOMIDINE HCL IN NACL 80 MCG/20ML IV SOLN
INTRAVENOUS | Status: AC
  Filled 2023-03-06: qty 20

## 2023-03-06 MED ORDER — ACETAMINOPHEN 10 MG/ML IV SOLN
1000.0000 mg | Freq: Once | INTRAVENOUS | Status: DC | PRN
Start: 2023-03-06 — End: 2023-03-06

## 2023-03-06 MED ORDER — FENTANYL CITRATE (PF) 100 MCG/2ML IJ SOLN
INTRAMUSCULAR | Status: AC
  Filled 2023-03-06: qty 2

## 2023-03-06 MED ORDER — DEXMEDETOMIDINE HCL IN NACL 80 MCG/20ML IV SOLN
INTRAVENOUS | Status: DC | PRN
  Administered 2023-03-06 (×5): 8 ug via INTRAVENOUS

## 2023-03-06 MED ORDER — BACITRACIN ZINC 500 UNIT/GM EX OINT
TOPICAL_OINTMENT | CUTANEOUS | Status: AC
  Filled 2023-03-06: qty 28.35

## 2023-03-06 MED ORDER — ORAL CARE MOUTH RINSE: 15.0000 mL | Freq: Once | OROMUCOSAL | Status: AC

## 2023-03-06 MED ORDER — ACETAMINOPHEN 10 MG/ML IV SOLN
INTRAVENOUS | Status: AC
  Filled 2023-03-06: qty 100

## 2023-03-06 MED ORDER — CHLORHEXIDINE GLUCONATE 0.12 % MT SOLN
OROMUCOSAL | Status: AC
  Filled 2023-03-06: qty 15

## 2023-03-06 MED ORDER — LIDOCAINE HCL (PF) 2 % IJ SOLN
INTRAMUSCULAR | Status: AC
  Filled 2023-03-06: qty 5

## 2023-03-06 MED ORDER — BACITRACIN ZINC 500 UNIT/GM EX OINT
TOPICAL_OINTMENT | CUTANEOUS | Status: DC | PRN
  Administered 2023-03-06: 1 via TOPICAL

## 2023-03-06 MED ORDER — PROPOFOL 10 MG/ML IV BOLUS
INTRAVENOUS | Status: DC | PRN
  Administered 2023-03-06: 50 mg via INTRAVENOUS
  Administered 2023-03-06: 200 mg via INTRAVENOUS
  Administered 2023-03-06: 150 ug/kg/min via INTRAVENOUS

## 2023-03-06 MED ORDER — ROCURONIUM BROMIDE 10 MG/ML (PF) SYRINGE
PREFILLED_SYRINGE | INTRAVENOUS | Status: DC | PRN
  Administered 2023-03-06: 50 mg via INTRAVENOUS

## 2023-03-06 MED ORDER — 0.9 % SODIUM CHLORIDE (POUR BTL) OPTIME
TOPICAL | Status: DC | PRN
  Administered 2023-03-06: 500 mL

## 2023-03-06 MED ORDER — ALBUTEROL SULFATE HFA 108 (90 BASE) MCG/ACT IN AERS
INHALATION_SPRAY | RESPIRATORY_TRACT | Status: DC | PRN
  Administered 2023-03-06: 6 via RESPIRATORY_TRACT

## 2023-03-06 MED ORDER — CEFAZOLIN SODIUM-DEXTROSE 2-4 GM/100ML-% IV SOLN
2.0000 g | INTRAVENOUS | Status: AC
  Administered 2023-03-06: 2 g via INTRAVENOUS

## 2023-03-06 MED ORDER — FENTANYL CITRATE (PF) 100 MCG/2ML IJ SOLN: 25.0000 ug | INTRAMUSCULAR | Status: DC | PRN

## 2023-03-06 MED ORDER — ACETAMINOPHEN 10 MG/ML IV SOLN
INTRAVENOUS | Status: DC | PRN
  Administered 2023-03-06: 1000 mg via INTRAVENOUS

## 2023-03-06 MED ORDER — OXYCODONE HCL 5 MG/5ML PO SOLN: 5.0000 mg | Freq: Once | ORAL | Status: DC | PRN

## 2023-03-06 MED ORDER — PROPOFOL 1000 MG/100ML IV EMUL
INTRAVENOUS | Status: AC
  Filled 2023-03-06: qty 100

## 2023-03-06 MED ORDER — CHLORHEXIDINE GLUCONATE 0.12 % MT SOLN
15.0000 mL | Freq: Once | OROMUCOSAL | Status: AC
  Administered 2023-03-06: 15 mL via OROMUCOSAL

## 2023-03-06 MED ORDER — MIDAZOLAM HCL 2 MG/2ML IJ SOLN
INTRAMUSCULAR | Status: AC
  Filled 2023-03-06: qty 2

## 2023-03-06 MED ORDER — MIDAZOLAM HCL 2 MG/2ML IJ SOLN
INTRAMUSCULAR | Status: DC | PRN
  Administered 2023-03-06: 2 mg via INTRAVENOUS

## 2023-03-06 SURGICAL SUPPLY — 27 items
CHLORAPREP W/TINT 26 (MISCELLANEOUS) ×1 IMPLANT
DERMABOND ADVANCED .7 DNX12 (GAUZE/BANDAGES/DRESSINGS) ×1 IMPLANT
DRAIN PENROSE 12X.25 LTX STRL (MISCELLANEOUS) ×1 IMPLANT
DRAPE LAPAROTOMY 77X122 PED (DRAPES) ×1 IMPLANT
DRSG GAUZE FLUFF 36X18 (GAUZE/BANDAGES/DRESSINGS) ×1 IMPLANT
DRSG TELFA 3X4 N-ADH STERILE (GAUZE/BANDAGES/DRESSINGS) IMPLANT
ELECT REM PT RETURN 9FT ADLT (ELECTROSURGICAL) ×1
ELECTRODE REM PT RTRN 9FT ADLT (ELECTROSURGICAL) ×1 IMPLANT
GAUZE 4X4 16PLY ~~LOC~~+RFID DBL (SPONGE) ×1 IMPLANT
GAUZE SPONGE 4X4 12PLY STRL (GAUZE/BANDAGES/DRESSINGS) ×1 IMPLANT
GAUZE STRETCH 2X75IN STRL (MISCELLANEOUS) ×1 IMPLANT
GLOVE BIO SURGEON STRL SZ 6.5 (GLOVE) ×1 IMPLANT
GLOVE BIO SURGEON STRL SZ7 (GLOVE) ×1 IMPLANT
GOWN STRL REUS W/ TWL LRG LVL3 (GOWN DISPOSABLE) ×2 IMPLANT
KIT TURNOVER KIT A (KITS) ×1 IMPLANT
MANIFOLD NEPTUNE II (INSTRUMENTS) ×1 IMPLANT
NDL HYPO 22X1.5 SAFETY MO (MISCELLANEOUS) IMPLANT
NDL HYPO 25X1 1.5 SAFETY (NEEDLE) ×1 IMPLANT
NEEDLE HYPO 22X1.5 SAFETY MO (MISCELLANEOUS) ×1
NEEDLE HYPO 25X1 1.5 SAFETY (NEEDLE) ×1
NS IRRIG 500ML POUR BTL (IV SOLUTION) ×1 IMPLANT
PACK BASIN MINOR ARMC (MISCELLANEOUS) ×1 IMPLANT
SUPPORETR ATHLETIC LG (MISCELLANEOUS) ×1 IMPLANT
SUPPORTER ATHLETIC LG (MISCELLANEOUS)
SUT CHROMIC 3 0 PS 2 (SUTURE) IMPLANT
SUT CHROMIC 4 0 RB 1X27 (SUTURE) IMPLANT
SYR 10ML LL (SYRINGE) ×1 IMPLANT

## 2023-03-06 NOTE — Anesthesia Procedure Notes (Signed)
 Procedure Name: LMA Insertion Date/Time: 03/06/2023 11:21 AM  Performed by: Lorriane Arabia, CRNAPre-anesthesia Checklist: Patient identified, Patient being monitored, Timeout performed, Emergency Drugs available and Suction available Patient Re-evaluated:Patient Re-evaluated prior to induction Oxygen Delivery Method: Circle system utilized Preoxygenation: Pre-oxygenation with 100% oxygen Induction Type: IV induction Ventilation: Mask ventilation without difficulty LMA: LMA inserted LMA Size: 5.0 Tube type: Oral Number of attempts: 1 Placement Confirmation: positive ETCO2 and breath sounds checked- equal and bilateral Tube secured with: Tape Dental Injury: Teeth and Oropharynx as per pre-operative assessment  Comments: Pt bronchospasmed with LMA placement and did not tolerate well. Not getting enough tidal volume

## 2023-03-06 NOTE — Op Note (Signed)
 Date of procedure: 03/06/23  Preoperative diagnosis:  Sebaceous cyst of scrotum, 2 cm Sebaceous cyst of scrotum, 5 mm  Postoperative diagnosis:  Same  Procedure: Excision of sebaceous cyst scrotum, 2 cm Excision of sebaceous cyst scrotum, 5 mm  Surgeon: Redell Burnet, MD  Anesthesia: General  Complications: None  Intraoperative findings:  Uncomplicated excision of both scrotal sebaceous cysts  EBL: Minimal  Specimens: None  Drains: None  Indication: LEANORD THIBEAU is a 48 y.o. patient with bothersome scrotal lesions consistent with sebaceous cyst who requested removal.  After reviewing the management options for treatment, they elected to proceed with the above surgical procedure(s). We have discussed the potential benefits and risks of the procedure, side effects of the proposed treatment, the likelihood of the patient achieving the goals of the procedure, and any potential problems that might occur during the procedure or recuperation. Informed consent has been obtained.  Description of procedure:  The patient was taken to the operating room and general anesthesia was induced. SCDs were placed for DVT prophylaxis. The patient was placed in the supine position, prepped and draped in the usual sterile fashion, and preoperative antibiotics(Ancef ) were administered. A preoperative time-out was performed.   On exam there was a 2 cm easily palpable sebaceous cyst at the base of the scrotum and ventral aspect of the penis, as well as a smaller 5 mm cyst in the right inferior scrotum.  I started with the smaller lesion, and local anesthetic without epinephrine  was injected.  A small elliptical incision was made over the cyst, and using a combination of sharp dissection and electrocautery, the cyst was removed intact from the scrotum.  Hemostasis was achieved with electrocautery, and the small incision was closed with 3-0 chromic running suture.  I then turned my attention to the  larger lesion at the ventral aspect of the penis and penoscrotal junction.  Local anesthetic was injected.  Elliptical incision was made over the sebaceous cyst, the sebaceous cyst was grasped, and carefully dissected free.  Meticulous hemostasis was achieved.  The cyst was removed intact.  The cavity was irrigated free.  Interrupted Vicryl sutures were used to close the defect, and the skin was closed with interrupted 3-0 chromic sutures.  A dressing of bacitracin  and Telfa, fluffs, and mesh pants were applied.  Disposition: Stable to PACU  Plan: Follow-up wound check with PA in 6 to 8 weeks  Redell Burnet, MD

## 2023-03-06 NOTE — Transfer of Care (Signed)
 Immediate Anesthesia Transfer of Care Note  Patient: Logan Crawford  Procedure(s) Performed: EXCISION OF SCROTUM LESION, SEBACEOUS CYST  Patient Location: PACU  Anesthesia Type:General  Level of Consciousness: drowsy and patient cooperative  Airway & Oxygen Therapy: Patient Spontanous Breathing and Patient connected to nasal cannula oxygen  Post-op Assessment: Report given to RN and Post -op Vital signs reviewed and stable  Post vital signs: Reviewed and stable  Last Vitals:  Vitals Value Taken Time  BP 90/54 03/06/23 1225  Temp    Pulse 78 03/06/23 1225  Resp 20 03/06/23 1225  SpO2 96 % 03/06/23 1225  Vitals shown include unfiled device data.  Last Pain:  Vitals:   03/06/23 0915  TempSrc: Oral  PainSc: 0-No pain         Complications: No notable events documented.

## 2023-03-06 NOTE — Anesthesia Preprocedure Evaluation (Addendum)
 Anesthesia Evaluation  Patient identified by MRN, date of birth, ID band Patient awake  General Assessment Comment:  Patient experienced (+) significant perioperative HYPERkalemia during his CABG at [facility] in 01/2022 (ASA IV). Preoperative K+ was 4.6 mmol/L with a creatinine of 1.3 mg/dL.  Prior to being placed on cardiopulmonary bypass, patient with significant elevation of his K+ level.  Initial consideration was atypical malignant hyperthermia, thus patient was treated with intravenous dantrolene .  K+ level was / managed intraoperatively with furosemide  + insulin  + calcium . Additionally,  patient treated with extended dialysis equivalent via cardiopulmonary bypass pump (remained on pump 1 hour after procedure). Hyperkalemia persisted postoperatively, peaking at 7.8 mmol/L. Etiology of electrolyte derangement unknown.  Providers did consult with  malignant hyperthermia society and were advised that event was unlikely related to malignant hyperthermia.  No confirmatory CHCT testing was performed.  Patient was advised by providers that if he ever underwent anesthesia again, he should advise his anesthesia provider that he experienced an unexplained episode of perioperative hyperkalemia in the past.   Reviewed: Allergy & Precautions, NPO status , Patient's Chart, lab work & pertinent test results  History of Anesthesia Complications (+) MALIGNANT HYPERTHERMIA and history of anesthetic complications  Airway Mallampati: II  TM Distance: >3 FB Neck ROM: Full    Dental no notable dental hx. (+) Teeth Intact   Pulmonary sleep apnea , COPD, Current Smoker and Patient abstained from smoking.   Pulmonary exam normal breath sounds clear to auscultation       Cardiovascular Exercise Tolerance: Good METShypertension, Pt. on medications + angina  + CAD, + Past MI, + Cardiac Stents, + CABG and +CHF  + dysrhythmias  Rhythm:Regular  Rate:Normal - Systolic murmurs  Patient suffered an NSTEMI on 01/23/2020.  Troponins were trended: 3520 --> 2990 ng/L.  Diagnostic LEFT heart catheterization was performed on 01/14/2020 revealing multivessel CAD; 60% ostial proximal RCA, 40% proximal and mid RCA, 90% mid LAD, and 40% distal LAD.  PCI was subsequently performed placing a 2.75 x 22 mm Resolute Onyx DES x 1 to the mid LAD lesion.  Procedure yielded excellent angiographic result and TIMI-3 flow.    Patient suffered a second NSTEMI on 05/18/2021.  Troponins were trended: 101 --> 110 --> 175 ng/L.  Diagnostic LEFT heart catheterization was performed on 05/20/2021 revealing multivessel CAD; 40% distal LAD, 20% proximal to mid RCA, 50% ostial to proximal RCA, 40% RPDA, 30% ostial LM, 85% mid LAD with distal edge dissection, and 60% D2.  PCI was subsequently performed placing overlapping 2.75 x 34 mm and 2.5 x 8 mm Onyx Frontier DES x 2 to the mid LAD lesion yielding excellent angiographic result and TIMI-3 flow.    Patient with recurrent anginal symptoms requiring repeat diagnostic LEFT heart catheterization on 11/18/2021.  Study revealed multivessel CAD; 30% ostial LM, 40% distal LAD, 50% ostial to proximal RCA, 40% proximal to mid RCA, 30% D2, 40% RPDA, 80% mid LAD-1, 60% mid LAD-3, and 70% mid RCA.  Previously placed overlapping stents within the LAD noted to be widely patent.  Intracoronary nitroglycerin  was administered improving overall appearance of the coronary arteries.  Cardiology noted that this would be the second in-stent restenosis of the LAD.  Management options were considered including revascularization versus stent POBA.  Plans were to discuss with patient following conclusion of the procedure.    As previously mentioned, patient with 2 episodes of in-stent restenosis of the LAD.  Management options were discussed, and patient elected to go with  less invasive angioplasty route.  Repeat diagnostic LEFT heart catheterization was  performed on 02/13/2022 revealing significant multivessel coronary artery disease with significant in-stent restenosis within the LAD.  Given the degree and complexity of patient's coronary artery disease and multiple areas of in-stent restenosis, coupled with 2 previous episodes of in-stent restenosis with subsequent intervention, the decision was made to refer to CVTS for consideration of revascularization.      Patient underwent three-vessel CABG procedure on 02/19/2022.  LIMA-D1, SVG-LAD, and SVG-PDA bypass grafts were placed.  LAA closure was also simultaneously performed.  Procedure was complicated by perioperative hyperkalemia. Patient developed complete heart block.  Etiology unknown. 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    Patient experiencing recent episodes of increasing chest pain and shortness of breath.  Episodes questionably related to recent cocaine use.  He underwent myocardial perfusion imaging study on 01/27/2023 revealing a mildly reduced left ventricular systolic function with an EF of 45-50%.  There was a moderate in size, moderate in severity, reversible defect involving the apex noted.  Findings concerning for ischemia, however cardiology noted that artifact could not be excluded.  Study was determined to be intermediate risk.  Cardiac catheterization was recommended.    Patient underwent diagnostic LEFT heart catheterization on 02/03/2023 revealing multivessel CAD; 30% ostial LM, 70% ISR proximal-mid LAD, 80% mid LAD-1, 70% mid LAD-2, 99% distal LAD, 80% D2, 70% proximal RCA-1, 70% proximal RCA-2, 70% mid RCA-1, 95% mid RCA-2, 40% distal RCA, 60% RV branch, and 60% RPDA.  SVG-LAD occluded.  Remaining bypass grafts were noted to be patent.  Interventional cardiology made the recommendation to continue indefinite DAPT therapy.  Antianginal therapy with addition of long-acting nitrate was also  suggested.  For refractory symptoms, PTCA/PCI of the ISR within the LAD could be considered.  Patient to be further discussed at cardiovascular review board.    TTE performed on 02/12/2023 revealed normal left ventricular systolic function with an EF of 55 to 60%.  There were no regional wall motion abnormalities.  There was mild LVH. Left ventricular diastolic Doppler parameters consistent with pseudonormalization (G2DD).  Average GLS -10.5%.  Right ventricular size and function normal.  There was mild mitral valve regurgitation. All transvalvular gradients were noted to be normal providing no evidence suggestive of valvular stenosis. Aorta normal in size with no evidence of aneurysmal dilatation.      Neuro/Psych negative neurological ROS  negative psych ROS   GI/Hepatic hiatal hernia,GERD  ,,(+)     (-) substance abuse    Endo/Other  neg diabetes    Renal/GU CRFRenal disease     Musculoskeletal   Abdominal   Peds  Hematology   Anesthesia Other Findings Past Medical History: No date: (HFpEF) heart failure with preserved ejection fraction (HCC)     Comment:  a.) TTE 11/26/19: EF 50-55, G1DD; b.) TTE 01/24/20: EF               50, LVH, mid-apical antsep HK, G1DD; c.) TTE 05/18/21: EF              50-55, antsep/sep/apical HK, LVH, mild MR, AoV calc; d.)               TTE 07/16/21: EF 60-65, LVH, G2DD, mild MD; e.) TTE               02/18/22: EF 55, sep/inf HK, LVH, G1DD, mild MR; f.) TTE  03/19/22: EF 65-70, antsep/periapical HK, LVH, G1DD, mild              MR, AoV calc; g.) TTE 02/12/2023: EF 55-60, LVH, G2DD,               mild MR No date: Anginal pain (HCC) No date: Aortic atherosclerosis (HCC) 02/20/2022: Bilateral pleural effusion     Comment:  a.) s/p CABG and LAA closure (L > R) --> progressive               during admission --> ultimately required LEFT               thoracentesis on 03/10/2022 (750 cc serous yield) No date: CAD (coronary artery  disease)     Comment:  a.)PCI 01/24/20: 90 mLAD (2.75x22 Res Onyx); b.)PCI               05/20/21: 85 mLAD (2.75x24 + 2.5x8 Onyx); c.)LHC 11/18/21:               80 ISR + 60 mLAD - CVTS vs stent POBA; d.)LHC 02/13/22:               70 ISR p-mLAD, 80/50 ISR mLAD, 99 ISR dLAD, 80 jailed D2,              diff HG RCA Dz; e.)3v CABG 02/19/22; f.)LHC 02/03/23: 30               LM, 70 p-mLAD, 80/70 mLAD, 99 dLAD, 80 D2, 70/70 pRCA,               70/95 mRCA, 40 dRCA, 60 RV branch, 60 RPDA, 100 SVG-LAD -              Rx mgmt No date: CKD (chronic kidney disease), stage III (HCC) 02/19/2022: Complete heart block (HCC)     Comment:  a.) during CABG/LAA closure --> hyperkalemic CHB in OR               before heart surgery had functionally begun (not on               bypass yet); etiology unknown; ?? due to medication               administration or contaminant given fairly normal preop               renal function (Cr 1.3). Persisted post-bypass event with              extensive dialysis equivalent via CPB pump --> Tx'd with               dantrolene  02/19/2022: Complication of anesthesia     Comment:  a.) perioperative HYPERkalemia - concern for atypical MH              initially - Tx'd intraoperaitvely with dantrolene . Preop               K+ 4.6 and CR 1.3 --> K+ peaked at 7.8 intra/postop -->               rec'd furosemide  + insulin  + calcium  + extended CBP               (remained on pump at least 1 hour after procedure).               Consult with MH society determined unlikely MH (see  Vear, MD note 02/22/2022). No confirmatory CHCT               testing. No date: COPD (chronic obstructive pulmonary disease) (HCC) No date: Genital warts No date: GERD (gastroesophageal reflux disease) No date: Hiatal hernia 11/25/2019: History of 2019 novel coronavirus disease (COVID-19) 02/19/2022: History of left atrial appendage closure     Comment:  a.) performed simultaneously with  cardiac               revascularization (CABG) procedure on 02/19/2022 No date: Hyperlipidemia No date: Hypertension No date: Ischemic cardiomyopathy     Comment:  a.) TTE 11/26/19: EF 50-55; b.)TTE 01/24/20: EF 50;               c.)LHC 01/24/20: EF 45-50; d.)MV 11/29/20: EF 43; e.)TTE               05/18/21: EF 50-55; f.)LHC 05/20/21: EF 50-55; g.)TTE               07/16/21: EF 60-65; h.) TTE 11/16/21: EF 60-65; i.) LHC               11/18/21: EF 55-65; j.) TTE 02/12/22: EF 60-65; k.) TTE               02/18/22: EF 55; l.) TTE 03/19/22: EF 65-70; m.) TTE               05/01/22: EF 55; n.) MV 01/27/23: EF 45-50; o.) TTE               02/12/2023: EF 55-60 No date: Leukocytosis No date: Long term current use of clopidogrel  No date: Long-term use of aspirin  therapy 02/18/2022: Nose colonized with MRSA     Comment:  a.) surgical PCR (+) 02/15/2022 prior to CABG 01/23/2020: NSTEMI (non-ST elevated myocardial infarction) [facility])     Comment:  a.) troponins were trended: 3520 --> 2990 ng/L; b.)               LHC/PCI 01/24/2020: 60% o-pRCA, 40% p-mRCA, 90% mLAD               (2.75 x 22 mm Resolute Onyx DES), 40% dLAD 05/18/2021: NSTEMI (non-ST elevated myocardial infarction) (HCC)     Comment:  a.) troponins were trended: 101 --> 110 --> 175 ng/L;               b.) LHC/PCI 05/20/2021: 40% dLAD, 40% p-mRCA, 50% o-pRCA,              40% RPDA, 30% oLM, 85% mLAD with distal edge dissection               (overlapping 2.75 x 34 mm and 2.5 x 8 mm Onyx Frontier               DES), 60% D2 No date: OSA on CPAP No date: Polysubstance abuse (HCC)     Comment:  a.) cocaine, THC, opiates, tobacco 02/19/2022: Postoperative cardiogenic shock (HCC)     Comment:  a.) required vasopressor/ionotropic agent support + IABP No date: Recurrent cellulitis of thigh 02/19/2022: S/P CABG x 3     Comment:  a.) LIMA-D1, SVG-LAD, SVG-PDA + LAA closure; b.) c/b               periop HYPERkalemia --> initial concerns for  atypical               malignant hyperthemia --> rec'd dantrolene . Consult with  MH society determined MH unlikely (see Argwala, MD note               02/22/2022); b.) c/b postop cardiogenic shock req               vasopressor/inotrophic support + IABP; c.) c/b dev of               BILATERAL pleural effusions (L > R) req LEFT               thoracentesis (750 cc yield) No date: Scrotal lesion (sebaceous cyst) 11/25/2019: Tobacco use  Reproductive/Obstetrics                             Anesthesia Physical Anesthesia Plan  ASA: 3  Anesthesia Plan: General   Post-op Pain Management: Ofirmev  IV (intra-op)*   Induction: Intravenous  PONV Risk Score and Plan: 2 and Ondansetron , Dexamethasone, TIVA and Propofol  infusion  Airway Management Planned: LMA  Additional Equipment: None  Intra-op Plan:   Post-operative Plan: Extubation in OR  Informed Consent: I have reviewed the patients History and Physical, chart, labs and discussed the procedure including the risks, benefits and alternatives for the proposed anesthesia with the patient or authorized representative who has indicated his/her understanding and acceptance.     Dental advisory given  Plan Discussed with: CRNA and Surgeon  Anesthesia Plan Comments: (Even though MH unlikely, very easy for us  to take MH precautions. Discussed risks of anesthesia with patient, including PONV, sore throat, lip/dental/eye damage. Rare risks discussed as well, such as cardiorespiratory and neurological sequelae, and allergic reactions. Discussed the role of CRNA in patient's perioperative care. Patient understands.)       Anesthesia Quick Evaluation

## 2023-03-06 NOTE — Anesthesia Procedure Notes (Signed)
 Procedure Name: Intubation Date/Time: 03/06/2023 11:25 AM  Performed by: Lorriane Arabia, CRNAPre-anesthesia Checklist: Patient identified, Patient being monitored, Timeout performed, Emergency Drugs available and Suction available Patient Re-evaluated:Patient Re-evaluated prior to induction Oxygen Delivery Method: Circle system utilized Preoxygenation: Pre-oxygenation with 100% oxygen Induction Type: IV induction Ventilation: Mask ventilation without difficulty Laryngoscope Size: 4 and McGrath Grade View: Grade I Tube type: Oral Tube size: 7.5 mm Number of attempts: 1 Airway Equipment and Method: Stylet Placement Confirmation: ETT inserted through vocal cords under direct vision, positive ETCO2 and breath sounds checked- equal and bilateral Secured at: 21 cm Tube secured with: Tape Dental Injury: Teeth and Oropharynx as per pre-operative assessment  Comments: Exchanged LMA for ETT due to bronchospasm. Without use of anesthesia gases; it was determined that the pt would tolerate the LMA with no paralytic and a propofol  gtt

## 2023-03-06 NOTE — H&P (Signed)
 03/06/23 10:54 AM   Logan Crawford 18-Mar-1975 [id number]  CC: Scrotal lesion  HPI: 48 year old male with a number of other medical issues who has had a bothersome 2 cm sebaceous cyst at the base of the penis that continues to get infected with drainage and he is opted for removal.  There is an additional small spot in the right scrotum he would also like removed.   PMH: Past Medical History:  Diagnosis Date   (HFpEF) heart failure with preserved ejection fraction (HCC)    a.) TTE 11/26/19: EF 50-55, G1DD; b.) TTE 01/24/20: EF 50, LVH, mid-apical antsep HK, G1DD; c.) TTE 05/18/21: EF 50-55, antsep/sep/apical HK, LVH, mild MR, AoV calc; d.) TTE 07/16/21: EF 60-65, LVH, G2DD, mild MD; e.) TTE 02/18/22: EF 55, sep/inf HK, LVH, G1DD, mild MR; f.) TTE 03/19/22: EF 65-70, antsep/periapical HK, LVH, G1DD, mild MR, AoV calc; g.) TTE 02/12/2023: EF 55-60, LVH, G2DD, mild MR   Anginal pain (HCC)    Aortic atherosclerosis (HCC)    Bilateral pleural effusion 02/20/2022   a.) s/p CABG and LAA closure (L > R) --> progressive during admission --> ultimately required LEFT thoracentesis on 03/10/2022 (750 cc serous yield)   CAD (coronary artery disease)    a.)PCI 01/24/20: 90 mLAD (2.75x22 Res Onyx); b.)PCI 05/20/21: 85 mLAD (2.75x24 + 2.5x8 Onyx); c.)LHC 11/18/21: 80 ISR + 60 mLAD - CVTS vs stent POBA; d.)LHC 02/13/22: 70 ISR p-mLAD, 80/50 ISR mLAD, 99 ISR dLAD, 80 jailed D2, diff HG RCA Dz; e.)3v CABG 02/19/22; f.)LHC 02/03/23: 30 LM, 70 p-mLAD, 80/70 mLAD, 99 dLAD, 80 D2, 70/70 pRCA, 70/95 mRCA, 40 dRCA, 60 RV branch, 60 RPDA, 100 SVG-LAD - Rx mgmt   CKD (chronic kidney disease), stage III (HCC)    Complete heart block (HCC) 02/19/2022   a.) during CABG/LAA closure --> hyperkalemic CHB in OR before heart surgery had functionally begun (not on bypass yet); etiology unknown; ?? due to medication administration or contaminant given fairly normal preop renal function (Cr 1.3). Persisted post-bypass event  with extensive dialysis equivalent via CPB pump --> Tx'd with dantrolene    Complication of anesthesia 02/19/2022   a.) perioperative HYPERkalemia - concern for atypical MH initially - Tx'd intraoperaitvely with dantrolene . Preop K+ 4.6 and CR 1.3 --> K+ peaked at 7.8 intra/postop --> rec'd furosemide  + insulin  + calcium  + extended CBP (remained on pump at least 1 hour after procedure). Consult with MH society determined unlikely MH (see Argwala, MD note 02/22/2022). No confirmatory CHCT testing.   COPD (chronic obstructive pulmonary disease) (HCC)    Genital warts    GERD (gastroesophageal reflux disease)    Hiatal hernia    History of 2019 novel coronavirus disease (COVID-19) 11/25/2019   History of left atrial appendage closure 02/19/2022   a.) performed simultaneously with cardiac revascularization (CABG) procedure on 02/19/2022   Hyperlipidemia    Hypertension    Ischemic cardiomyopathy    a.) TTE 11/26/19: EF 50-55; b.)TTE 01/24/20: EF 50; c.)LHC 01/24/20: EF 45-50; d.)MV 11/29/20: EF 43; e.)TTE 05/18/21: EF 50-55; f.)LHC 05/20/21: EF 50-55; g.)TTE 07/16/21: EF 60-65; h.) TTE 11/16/21: EF 60-65; i.) LHC 11/18/21: EF 55-65; j.) TTE 02/12/22: EF 60-65; k.) TTE 02/18/22: EF 55; l.) TTE 03/19/22: EF 65-70; m.) TTE 05/01/22: EF 55; n.) MV 01/27/23: EF 45-50; o.) TTE 02/12/2023: EF 55-60   Leukocytosis    Long term current use of clopidogrel     Long-term use of aspirin  therapy    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  a.) surgical PCR (+) 02/15/2022 prior to CABG   NSTEMI (non-ST elevated myocardial infarction) (HCC) 01/23/2020   a.) troponins were trended: 3520 --> 2990 ng/L; b.) LHC/PCI 01/24/2020: 60% o-pRCA, 40% p-mRCA, 90% mLAD (2.75 x 22 mm Resolute Onyx DES), 40% dLAD   NSTEMI (non-ST elevated myocardial infarction) (HCC) 05/18/2021   a.) troponins were trended: 101 --> 110 --> 175 ng/L; b.) LHC/PCI 05/20/2021: 40% dLAD, 40% p-mRCA, 50% o-pRCA, 40% RPDA, 30% oLM, 85% mLAD with distal  edge dissection (overlapping 2.75 x 34 mm and 2.5 x 8 mm Onyx Frontier DES), 60% D2   OSA on CPAP    Polysubstance abuse (HCC)    a.) cocaine, THC, opiates, tobacco   Postoperative cardiogenic shock (HCC) 02/19/2022   a.) required vasopressor/ionotropic agent support + IABP   Recurrent cellulitis of thigh    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 Consult with MH society determined MH unlikely (see Argwala, MD note 02/22/2022); b.) c/b postop cardiogenic shock req vasopressor/inotrophic support + IABP; c.) c/b dev of BILATERAL pleural effusions (L > R) req LEFT thoracentesis (750 cc yield)   Scrotal lesion (sebaceous cyst)    Tobacco use 11/25/2019    Surgical History: Past Surgical History:  Procedure Laterality Date   CORONARY ARTERY BYPASS GRAFT N/A 02/19/2022   Procedure: CORONARY ARTERY BYPASS GRAFTING (CABG) X THREE, USING ENDOSCOPICALLY HARVESTED RIGHT GREATER SAPHENOUS VEIN  AND LEFT ATRIAL APPENDAGE CLIPPING;  Surgeon: Murriel Toribio DEL, MD;  Location: [facility] OR;  Service: Open Heart Surgery;  Laterality: N/A;   CORONARY STENT INTERVENTION N/A 01/24/2020   Procedure: CORONARY STENT INTERVENTION;  Surgeon: Darron Deatrice LABOR, MD;  Location: [facility];  Service: Cardiovascular;  Laterality: N/A;   CORONARY STENT INTERVENTION N/A 05/20/2021   Procedure: CORONARY STENT INTERVENTION;  Surgeon: Darron Deatrice LABOR, MD;  Location: [facility];  Service: Cardiovascular;  Laterality: N/A;   CORONARY ULTRASOUND/IVUS N/A 05/20/2021   Procedure: Intravascular Ultrasound/IVUS;  Surgeon: Darron Deatrice LABOR, MD;  Location: [facility];  Service: Cardiovascular;  Laterality: N/A;   IRRIGATION AND DEBRIDEMENT KNEE Left    LEFT ATRIAL APPENDAGE OCCLUSION Left 02/19/2022   Procedure: LEFT ATRIAL APPENDAGE OCCLUSION; Location: [facility]; Surgeon:  Toribio Murriel, MD   LEFT HEART CATH AND CORONARY ANGIOGRAPHY N/A 01/24/2020   Procedure: LEFT HEART CATH AND CORONARY ANGIOGRAPHY;  Surgeon: Darron Deatrice LABOR, MD;  Location: [facility];  Service: Cardiovascular;  Laterality: N/A;   LEFT HEART CATH AND CORONARY ANGIOGRAPHY N/A 11/18/2021   Procedure: LEFT HEART CATH AND CORONARY ANGIOGRAPHY;  Surgeon: Darron Deatrice LABOR, MD;  Location: [facility];  Service: Cardiovascular;  Laterality: N/A;   LEFT HEART CATH AND CORONARY ANGIOGRAPHY N/A 02/13/2022   Procedure: LEFT HEART CATH AND CORONARY ANGIOGRAPHY;  Surgeon: Anner Alm ORN, MD;  Location: [facility];  Service: Cardiovascular;  Laterality: N/A;   LEFT HEART CATH AND CORS/GRAFTS ANGIOGRAPHY N/A 05/20/2021   Procedure: LEFT HEART CATH AND CORS/GRAFTS ANGIOGRAPHY;  Surgeon: Darron Deatrice LABOR, MD;  Location: [facility];  Service: Cardiovascular;  Laterality: N/A;   LEFT HEART CATH AND CORS/GRAFTS ANGIOGRAPHY N/A 02/03/2023   Procedure: LEFT HEART CATH AND CORS/GRAFTS ANGIOGRAPHY;  Surgeon: Mady Bruckner, MD;  Location: [facility];  Service: Cardiovascular;  Laterality: N/A;   TYMPANOSTOMY TUBE PLACEMENT Bilateral  Family History: Family History  Problem Relation Age of Onset   Coronary artery disease Mother    Arrhythmia Mother        Pacemaker placed in 2021   Heart failure Mother    Breast cancer Mother    Coronary artery disease Father    Colon cancer Father    Colon polyps Father     Social History:  reports that he has been smoking cigarettes. He started smoking about 10 years ago. He has a 10.1 pack-year smoking history. He has been exposed to tobacco smoke. He has never used smokeless tobacco. He reports current alcohol use of about 1.0 standard drink of alcohol per week. He reports current drug use.  Physical Exam: BP 115/83   Pulse 84   Temp (!) 97.5 F (36.4 C) (Oral)   Resp 20   Ht 6' (1.829 m)   Wt 110.6 kg    SpO2 98%   BMI 33.07 kg/m    Constitutional:  Alert and oriented, No acute distress. Cardiovascular: Regular rate and rhythm Respiratory: Clear to auscultation bilaterally GI: Abdomen is soft, nontender, nondistended, no abdominal masses GU: 2 cm sebaceous cyst base of the penis, as well as a smaller 4 mm lesion right scrotum  Assessment & Plan:   48 year old comorbid male with a 2 cm sebaceous cyst at the base of the penis with ongoing recurrent infections who opted for excision.  There is an additional small 5 mm lesion in the right scrotum he would also like removed.  We discussed the risks of surgery including bleeding, infection, recurrence, postoperative pain/swelling/bruising, as well as general risk of anesthesia including MI, stroke, DVT/PE.  Excision of scrotal lesions, 2 cm sebaceous cyst base of the penis and 5 mm cyst right scrotum  Redell Burnet, MD 03/06/2023  [facility] [address], [address] [place], [place] [address] [phone]

## 2023-03-07 ENCOUNTER — Encounter: Payer: Self-pay | Admitting: Urology

## 2023-03-07 NOTE — Anesthesia Postprocedure Evaluation (Signed)
 Anesthesia Post Note  Patient: Logan Crawford  Procedure(s) Performed: EXCISION OF SCROTUM LESION, SEBACEOUS CYST  Patient location during evaluation: PACU Anesthesia Type: General Level of consciousness: awake and alert Pain management: pain level controlled Vital Signs Assessment: post-procedure vital signs reviewed and stable Respiratory status: spontaneous breathing, nonlabored ventilation, respiratory function stable and patient connected to nasal cannula oxygen Cardiovascular status: blood pressure returned to baseline and stable Postop Assessment: no apparent nausea or vomiting Anesthetic complications: no   No notable events documented.   Last Vitals:  Vitals:   03/06/23 1315 03/06/23 1326  BP: 93/61 108/72  Pulse: 77 76  Resp: 20 (!) 8  Temp: (!) 36.1 C (!) 36.2 C  SpO2: 97% 100%    Last Pain:  Vitals:   03/06/23 1326  TempSrc: Temporal  PainSc: 0-No pain                 Rome Ade

## 2023-03-17 ENCOUNTER — Emergency Department: Payer: Medicaid Other

## 2023-03-17 ENCOUNTER — Emergency Department
Admission: EM | Admit: 2023-03-17 | Discharge: 2023-03-17 | Disposition: A | Discharge status: Home / Self Care | Payer: Medicaid Other | Attending: Emergency Medicine | Admitting: Emergency Medicine

## 2023-03-17 ENCOUNTER — Other Ambulatory Visit: Payer: Self-pay

## 2023-03-17 DIAGNOSIS — N183 Chronic kidney disease, stage 3 unspecified: Secondary | ICD-10-CM | POA: Diagnosis not present

## 2023-03-17 DIAGNOSIS — Z951 Presence of aortocoronary bypass graft: Secondary | ICD-10-CM | POA: Insufficient documentation

## 2023-03-17 DIAGNOSIS — Z20822 Contact with and (suspected) exposure to covid-19: Secondary | ICD-10-CM | POA: Diagnosis not present

## 2023-03-17 DIAGNOSIS — I2510 Atherosclerotic heart disease of native coronary artery without angina pectoris: Secondary | ICD-10-CM | POA: Diagnosis not present

## 2023-03-17 DIAGNOSIS — Z7982 Long term (current) use of aspirin: Secondary | ICD-10-CM | POA: Diagnosis not present

## 2023-03-17 DIAGNOSIS — T8130XA Disruption of wound, unspecified, initial encounter: Secondary | ICD-10-CM | POA: Insufficient documentation

## 2023-03-17 DIAGNOSIS — D72829 Elevated white blood cell count, unspecified: Secondary | ICD-10-CM | POA: Diagnosis not present

## 2023-03-17 DIAGNOSIS — J449 Chronic obstructive pulmonary disease, unspecified: Secondary | ICD-10-CM | POA: Diagnosis not present

## 2023-03-17 DIAGNOSIS — Z7901 Long term (current) use of anticoagulants: Secondary | ICD-10-CM | POA: Insufficient documentation

## 2023-03-17 DIAGNOSIS — F17200 Nicotine dependence, unspecified, uncomplicated: Secondary | ICD-10-CM | POA: Diagnosis not present

## 2023-03-17 DIAGNOSIS — I129 Hypertensive chronic kidney disease with stage 1 through stage 4 chronic kidney disease, or unspecified chronic kidney disease: Secondary | ICD-10-CM | POA: Insufficient documentation

## 2023-03-17 LAB — CBC WITH DIFFERENTIAL/PLATELET
Abs Immature Granulocytes: 0.03 10*3/uL (ref 0.00–0.07)
Basophils Absolute: 0.1 10*3/uL (ref 0.0–0.1)
Basophils Relative: 1 %
Eosinophils Absolute: 0.9 10*3/uL — ABNORMAL HIGH (ref 0.0–0.5)
Eosinophils Relative: 8 %
HCT: 51.3 % (ref 39.0–52.0)
Hemoglobin: 16.7 g/dL (ref 13.0–17.0)
Immature Granulocytes: 0 %
Lymphocytes Relative: 32 %
Lymphs Abs: 3.4 10*3/uL (ref 0.7–4.0)
MCH: 26.2 pg (ref 26.0–34.0)
MCHC: 32.6 g/dL (ref 30.0–36.0)
MCV: 80.4 fL (ref 80.0–100.0)
Monocytes Absolute: 0.7 10*3/uL (ref 0.1–1.0)
Monocytes Relative: 7 %
Neutro Abs: 5.6 10*3/uL (ref 1.7–7.7)
Neutrophils Relative %: 52 %
Platelets: 436 10*3/uL — ABNORMAL HIGH (ref 150–400)
RBC: 6.38 MIL/uL — ABNORMAL HIGH (ref 4.22–5.81)
RDW: 18 % — ABNORMAL HIGH (ref 11.5–15.5)
WBC: 10.6 10*3/uL — ABNORMAL HIGH (ref 4.0–10.5)
nRBC: 0 % (ref 0.0–0.2)

## 2023-03-17 LAB — BASIC METABOLIC PANEL
Anion gap: 11 (ref 5–15)
BUN: 9 mg/dL (ref 6–20)
CO2: 19 mmol/L — ABNORMAL LOW (ref 22–32)
Calcium: 8.9 mg/dL (ref 8.9–10.3)
Chloride: 104 mmol/L (ref 98–111)
Creatinine, Ser: 1.46 mg/dL — ABNORMAL HIGH (ref 0.61–1.24)
GFR, Estimated: 59 mL/min — ABNORMAL LOW (ref >60)
Glucose, Bld: 110 mg/dL — ABNORMAL HIGH (ref 70–99)
Potassium: 3.4 mmol/L — ABNORMAL LOW (ref 3.5–5.1)
Sodium: 134 mmol/L — ABNORMAL LOW (ref 135–145)

## 2023-03-17 LAB — RESP PANEL BY RT-PCR (RSV, FLU A&B, COVID)  RVPGX2
Influenza A by PCR: NEGATIVE
Influenza B by PCR: NEGATIVE
Resp Syncytial Virus by PCR: NEGATIVE
SARS Coronavirus 2 by RT PCR: NEGATIVE

## 2023-03-17 LAB — LACTIC ACID, PLASMA: Lactic Acid, Venous: 1.9 mmol/L (ref 0.5–1.9)

## 2023-03-17 MED ORDER — CEPHALEXIN 500 MG PO CAPS
500.0000 mg | ORAL_CAPSULE | Freq: Once | ORAL | Status: AC
  Administered 2023-03-17: 500 mg via ORAL
  Filled 2023-03-17: qty 1

## 2023-03-17 MED ORDER — ONDANSETRON 4 MG PO TBDP: 4.0000 mg | ORAL_TABLET | Freq: Four times a day (QID) | ORAL | 0 refills | Status: DC | PRN

## 2023-03-17 MED ORDER — HYDROCODONE-ACETAMINOPHEN 5-325 MG PO TABS: 2.0000 | ORAL_TABLET | Freq: Three times a day (TID) | ORAL | 0 refills | Status: DC | PRN

## 2023-03-17 MED ORDER — HYDROCODONE-ACETAMINOPHEN 5-325 MG PO TABS
2.0000 | ORAL_TABLET | Freq: Once | ORAL | Status: AC
  Administered 2023-03-17: 2 via ORAL
  Filled 2023-03-17: qty 2

## 2023-03-17 MED ORDER — CEPHALEXIN 500 MG PO CAPS: 500.0000 mg | ORAL_CAPSULE | Freq: Four times a day (QID) | ORAL | 0 refills | Status: AC

## 2023-03-17 MED ORDER — ONDANSETRON 4 MG PO TBDP
4.0000 mg | ORAL_TABLET | Freq: Once | ORAL | Status: AC
  Administered 2023-03-17: 4 mg via ORAL
  Filled 2023-03-17: qty 1

## 2023-03-17 NOTE — ED Triage Notes (Signed)
Pt reports URI over the past few days, pt states he had cyst removed from scrotum on 1/11. Pt reports the incision opened 1 week ago and since pt has noted drainage from area.

## 2023-03-17 NOTE — ED Provider Notes (Signed)
[facility] Provider Note    Event Date/Time   First MD Initiated Contact with Patient 03/17/23 0510     (approximate)   History   Post-op Problem   HPI  Logan Crawford is a 48 y.o. male with history of CHF, CAD, complete heart block, chronic kidney disease, COPD, hypertension, hyperlipidemia who presents to the emergency department with wound dehiscence.  Patient underwent excision of a sebaceous cyst to his scrotum on 03/06/2023.  He reports about 2 to 3 days after surgery the wound opened back up.  He did not follow-up with his urologist.  States he has noticed some yellow bloody drainage.  Denies any fever.  He is not a diabetic.  He states he has not called his urologist about this issue.   He also reports he has had several days of cough and congestion.  History provided by patient.    Past Medical History:  Diagnosis Date   (HFpEF) heart failure with preserved ejection fraction (HCC)    a.) TTE 11/26/19: EF 50-55, G1DD; b.) TTE 01/24/20: EF 50, LVH, mid-apical antsep HK, G1DD; c.) TTE 05/18/21: EF 50-55, antsep/sep/apical HK, LVH, mild MR, AoV calc; d.) TTE 07/16/21: EF 60-65, LVH, G2DD, mild MD; e.) TTE 02/18/22: EF 55, sep/inf HK, LVH, G1DD, mild MR; f.) TTE 03/19/22: EF 65-70, antsep/periapical HK, LVH, G1DD, mild MR, AoV calc; g.) TTE 02/12/2023: EF 55-60, LVH, G2DD, mild MR   Anginal pain (HCC)    Aortic atherosclerosis (HCC)    Bilateral pleural effusion 02/20/2022   a.) s/p CABG and LAA closure (L > R) --> progressive during admission --> ultimately required LEFT thoracentesis on 03/10/2022 (750 cc serous yield)   CAD (coronary artery disease)    a.)PCI 01/24/20: 90 mLAD (2.75x22 Res Onyx); b.)PCI 05/20/21: 85 mLAD (2.75x24 + 2.5x8 Onyx); c.)LHC 11/18/21: 80 ISR + 60 mLAD - CVTS vs stent POBA; d.)LHC 02/13/22: 70 ISR p-mLAD, 80/50 ISR mLAD, 99 ISR dLAD, 80 jailed D2, diff HG RCA Dz; e.)3v CABG 02/19/22; f.)LHC 02/03/23: 30 LM, 70 p-mLAD, 80/70  mLAD, 99 dLAD, 80 D2, 70/70 pRCA, 70/95 mRCA, 40 dRCA, 60 RV branch, 60 RPDA, 100 SVG-LAD - Rx mgmt   CKD (chronic kidney disease), stage III (HCC)    Complete heart block (HCC) 02/19/2022   a.) during CABG/LAA closure --> hyperkalemic CHB in OR before heart surgery had functionally begun (not on bypass yet); etiology unknown; ?? due to medication administration or contaminant given fairly normal preop renal function (Cr 1.3). Persisted post-bypass event with extensive dialysis equivalent via CPB pump --> Tx'd with dantrolene   Complication of anesthesia 02/19/2022   a.) perioperative HYPERkalemia - concern for atypical MH initially - Tx'd intraoperaitvely with dantrolene. Preop K+ 4.6 and CR 1.3 --> K+ peaked at 7.8 intra/postop --> rec'd furosemide + insulin + calcium + extended CBP (remained on pump at least 1 hour after procedure). Consult with MH society determined "unlikely" MH (see Argwala, MD note 02/22/2022). No confirmatory CHCT testing.   COPD (chronic obstructive pulmonary disease) (HCC)    Genital warts    GERD (gastroesophageal reflux disease)    Hiatal hernia    History of 2019 novel coronavirus disease (COVID-19) 11/25/2019   History of left atrial appendage closure 02/19/2022   a.) performed simultaneously with cardiac revascularization (CABG) procedure on 02/19/2022   Hyperlipidemia    Hypertension    Ischemic cardiomyopathy    a.) TTE 11/26/19: EF 50-55; b.)TTE 01/24/20: EF 50; c.)LHC 01/24/20: EF 45-50;  d.)MV 11/29/20: EF 43; e.)TTE 05/18/21: EF 50-55; f.)LHC 05/20/21: EF 50-55; g.)TTE 07/16/21: EF 60-65; h.) TTE 11/16/21: EF 60-65; i.) LHC 11/18/21: EF 55-65; j.) TTE 02/12/22: EF 60-65; k.) TTE 02/18/22: EF 55; l.) TTE 03/19/22: EF 65-70; m.) TTE 05/01/22: EF 55; n.) MV 01/27/23: EF 45-50; o.) TTE 02/12/2023: EF 55-60   Leukocytosis    Long term current use of clopidogrel    Long-term use of aspirin therapy    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   a.) surgical PCR (+)  02/15/2022 prior to CABG   NSTEMI (non-ST elevated myocardial infarction) (HCC) 01/23/2020   a.) troponins were trended: 3520 --> 2990 ng/L; b.) LHC/PCI 01/24/2020: 60% o-pRCA, 40% p-mRCA, 90% mLAD (2.75 x 22 mm Resolute Onyx DES), 40% dLAD   NSTEMI (non-ST elevated myocardial infarction) (HCC) 05/18/2021   a.) troponins were trended: 101 --> 110 --> 175 ng/L; b.) LHC/PCI 05/20/2021: 40% dLAD, 40% p-mRCA, 50% o-pRCA, 40% RPDA, 30% oLM, 85% mLAD with distal edge dissection (overlapping 2.75 x 34 mm and 2.5 x 8 mm Onyx Frontier DES), 60% D2   OSA on CPAP    Polysubstance abuse (HCC)    a.) cocaine, THC, opiates, tobacco   Postoperative cardiogenic shock (HCC) 02/19/2022   a.) required vasopressor/ionotropic agent support + IABP   Recurrent cellulitis of thigh    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 Consult with MH society determined MH unlikely (see Argwala, MD note 02/22/2022); b.) c/b postop cardiogenic shock req vasopressor/inotrophic support + IABP; c.) c/b dev of BILATERAL pleural effusions (L > R) req LEFT thoracentesis (750 cc yield)   Scrotal lesion (sebaceous cyst)    Tobacco use 11/25/2019    Past Surgical History:  Procedure Laterality Date   CORONARY ARTERY BYPASS GRAFT N/A 02/19/2022   Procedure: CORONARY ARTERY BYPASS GRAFTING (CABG) X THREE, USING ENDOSCOPICALLY HARVESTED RIGHT GREATER SAPHENOUS VEIN  AND LEFT ATRIAL APPENDAGE CLIPPING;  Surgeon: Lyn Hollingshead, MD;  Location: [facility] OR;  Service: Open Heart Surgery;  Laterality: N/A;   CORONARY STENT INTERVENTION N/A 01/24/2020   Procedure: CORONARY STENT INTERVENTION;  Surgeon: Iran Ouch, MD;  Location: [facility];  Service: Cardiovascular;  Laterality: N/A;   CORONARY STENT INTERVENTION N/A 05/20/2021   Procedure: CORONARY STENT INTERVENTION;  Surgeon: Iran Ouch, MD;   Location: [facility];  Service: Cardiovascular;  Laterality: N/A;   CORONARY ULTRASOUND/IVUS N/A 05/20/2021   Procedure: Intravascular Ultrasound/IVUS;  Surgeon: Iran Ouch, MD;  Location: [facility];  Service: Cardiovascular;  Laterality: N/A;   IRRIGATION AND DEBRIDEMENT KNEE Left    LEFT ATRIAL APPENDAGE OCCLUSION Left 02/19/2022   Procedure: LEFT ATRIAL APPENDAGE OCCLUSION; Location: [facility]; Surgeon: Clare Charon, MD   LEFT HEART CATH AND CORONARY ANGIOGRAPHY N/A 01/24/2020   Procedure: LEFT HEART CATH AND CORONARY ANGIOGRAPHY;  Surgeon: Iran Ouch, MD;  Location: [facility];  Service: Cardiovascular;  Laterality: N/A;   LEFT HEART CATH AND CORONARY ANGIOGRAPHY N/A 11/18/2021   Procedure: LEFT HEART CATH AND CORONARY ANGIOGRAPHY;  Surgeon: Iran Ouch, MD;  Location: [facility];  Service: Cardiovascular;  Laterality: N/A;   LEFT HEART CATH AND CORONARY ANGIOGRAPHY N/A 02/13/2022   Procedure: LEFT HEART CATH AND CORONARY ANGIOGRAPHY;  Surgeon: Marykay Lex, MD;  Location: [facility];  Service: Cardiovascular;  Laterality:  N/A;   LEFT HEART CATH AND CORS/GRAFTS ANGIOGRAPHY N/A 05/20/2021   Procedure: LEFT HEART CATH AND CORS/GRAFTS ANGIOGRAPHY;  Surgeon: Iran Ouch, MD;  Location: [facility];  Service: Cardiovascular;  Laterality: N/A;   LEFT HEART CATH AND CORS/GRAFTS ANGIOGRAPHY N/A 02/03/2023   Procedure: LEFT HEART CATH AND CORS/GRAFTS ANGIOGRAPHY;  Surgeon: Yvonne Kendall, MD;  Location: [facility];  Service: Cardiovascular;  Laterality: N/A;   SCROTAL EXPLORATION N/A 03/06/2023   Procedure: EXCISION OF SCROTUM LESION, SEBACEOUS CYST;  Surgeon: Sondra Come, MD;  Location: [facility];  Service: Urology;  Laterality: N/A;   TYMPANOSTOMY TUBE PLACEMENT Bilateral     MEDICATIONS:  Prior to Admission medications   Medication Sig Start Date End Date Taking? Authorizing Provider   aspirin EC 81 MG tablet Take 1 tablet (81 mg total) by mouth daily. Swallow whole. 03/17/22   Sondra Barges, PA-C  clopidogrel (PLAVIX) 75 MG tablet Take 1 tablet (75 mg total) by mouth daily. 03/17/22   Dunn, Raymon Mutton, PA-C  empagliflozin (JARDIANCE) 10 MG TABS tablet Take 10 mg by mouth daily.    [provider]  famotidine (PEPCID) 20 MG tablet Take 1 tablet (20 mg total) by mouth at bedtime as needed for heartburn or indigestion. Patient taking differently: Take 20 mg by mouth at bedtime. 10/20/22   Donell Beers, FNP  Fluticasone-Umeclidin-Vilant (TRELEGY ELLIPTA) 100-62.5-25 MCG/ACT AEPB Inhale 1 puff into the lungs daily. 08/06/22   Raechel Chute, MD  furosemide (LASIX) 40 MG tablet Take 1 tablet (40 mg total) by mouth daily. 03/17/22   Sondra Barges, PA-C  gabapentin (NEURONTIN) 100 MG capsule Take 100 mg by mouth 2 (two) times daily as needed (pain).    [provider]  imiquimod (ALDARA) 5 % cream Apply 1 Application topically daily as needed (irritation). Patient not taking: Reported on 03/06/2023    [provider]  isosorbide mononitrate (IMDUR) 30 MG 24 hr tablet Take 1 tablet (30 mg total) by mouth daily. 02/03/23 02/03/24  End, Cristal Deer, MD  losartan (COZAAR) 50 MG tablet Take 1 tablet (50 mg total) by mouth daily. 03/17/22   Sondra Barges, PA-C  metoprolol succinate (TOPROL-XL) 25 MG 24 hr tablet Take 2 tablets (50 mg total) by mouth daily. Stop taking metoprolol if you continue to use cocaine 05/08/22   Bensimhon, Bevelyn Buckles, MD  nitroGLYCERIN (NITROSTAT) 0.4 MG SL tablet Place 1 tablet (0.4 mg total) under the tongue every 5 (five) minutes as needed for chest pain (if chest pain not resolved after 3 doses, please call 911). Patient not taking: Reported on 03/06/2023 02/03/23   End, Cristal Deer, MD  NON FORMULARY Pt uses a bi-pap nightly    [provider]  nystatin (MYCOSTATIN/NYSTOP) powder Apply 1 Application topically 3 (three) times  daily. Patient not taking: Reported on 02/23/2023 10/20/22   Donell Beers, FNP  pantoprazole (PROTONIX) 40 MG tablet Take 1 tablet by mouth once daily 02/23/23   Paseda, Phillips Grout R, FNP  rosuvastatin (CRESTOR) 40 MG tablet Take 1 tablet (40 mg total) by mouth daily. 04/25/22   Flossie Dibble, NP  spironolactone (ALDACTONE) 25 MG tablet Take 1 tablet (25 mg total) by mouth daily. 03/17/22   Dunn, Raymon Mutton, PA-C  triamcinolone (KENALOG) 0.025 % cream APPLY TOPICALLY TWICE DAILY Patient not taking: Reported on 02/23/2023 01/27/23   Donell Beers, FNP  VENTOLIN HFA 108 (90 Base) MCG/ACT inhaler Inhale 1-2 puffs into the lungs every 6 (  six) hours as needed for wheezing or shortness of breath. 06/18/22   [provider]    Physical Exam   Triage Vital Signs: ED Triage Vitals  Encounter Vitals Group     BP 03/17/23 0017 (!) 163/104     Systolic BP Percentile --      Diastolic BP Percentile --      Pulse Rate 03/17/23 0017 95     Resp 03/17/23 0017 18     Temp 03/17/23 0017 97.9 F (36.6 C)     Temp Source 03/17/23 0017 Oral     SpO2 03/17/23 0017 97 %     Weight 03/17/23 0016 220 lb (99.8 kg)     Height 03/17/23 0016 6' (1.829 m)     Head Circumference --      Peak Flow --      Pain Score 03/17/23 0016 6     Pain Loc --      Pain Education --      Exclude from Growth Chart --     Most recent vital signs: Vitals:   03/17/23 0017  BP: (!) 163/104  Pulse: 95  Resp: 18  Temp: 97.9 F (36.6 C)  SpO2: 97%    CONSTITUTIONAL: Alert, responds appropriately to questions. Well-appearing; well-nourished HEAD: Normocephalic, atraumatic EYES: Conjunctivae clear, pupils appear equal, sclera nonicteric ENT: normal nose; moist mucous membranes NECK: Supple, normal ROM CARD: RRR; S1 and S2 appreciated RESP: Normal chest excursion without splinting or tachypnea; breath sounds clear and equal bilaterally; no wheezes, no rhonchi, no rales, no hypoxia or respiratory distress,  speaking full sentences ABD/GI: Non-distended; soft, non-tender, no rebound, no guarding, no peritoneal signs GU:  Normal external genitalia, circumcised male, normal penile shaft, no blood or discharge at the urethral meatus, no testicular masses or tenderness on exam, no scrotal masses or swelling, no hernias appreciated, 2+ femoral pulses bilaterally; no perineal erythema, warmth, subcutaneous air or crepitus; no high riding testicle, patient has an open wound from a wound dehiscence to the scrotum without surrounding redness, warmth, induration, fluctuance.  Inside this open wound of the tissue appears to be normal granulation tissue without purulent drainage or bleeding.  Chaperone present for exam. BACK: The back appears normal EXT: Normal ROM in all joints; no deformity noted, no edema SKIN: Normal color for age and race; warm; no rash on exposed skin NEURO: Moves all extremities equally, normal speech PSYCH: The patient's mood and manner are appropriate.      Patient gave verbal permission to utilize photo for medical documentation only. The image was not stored on any personal device.  ED Results / Procedures / Treatments   LABS: (all labs ordered are listed, but only abnormal results are displayed) Labs Reviewed  CBC WITH DIFFERENTIAL/PLATELET - Abnormal; Notable for the following components:      Result Value   WBC 10.6 (*)    RBC 6.38 (*)    RDW 18.0 (*)    Platelets 436 (*)    Eosinophils Absolute 0.9 (*)    All other components within normal limits  BASIC METABOLIC PANEL - Abnormal; Notable for the following components:   Sodium 134 (*)    Potassium 3.4 (*)    CO2 19 (*)    Glucose, Bld 110 (*)    Creatinine, Ser 1.46 (*)    GFR, Estimated 59 (*)    All other components within normal limits  RESP PANEL BY RT-PCR (RSV, FLU A&B, COVID)  [id number]  AEROBIC CULTURE W  GRAM STAIN (SUPERFICIAL SPECIMEN)  LACTIC ACID, PLASMA     EKG:  EKG Interpretation Date/Time:     Ventricular Rate:    PR Interval:    QRS Duration:    QT Interval:    QTC Calculation:   R Axis:      Text Interpretation:           RADIOLOGY: My personal review and interpretation of imaging: Chest x-ray clear.  I have personally reviewed all radiology reports.   DG Chest 2 View Result Date: 03/17/2023 CLINICAL DATA:  Coughing and right-sided chest pain. EXAM: CHEST - 2 VIEW COMPARISON:  Chest PA and lateral 06/20/2022. FINDINGS: The heart size and mediastinal contours are within normal limits. There is mild atherosclerosis in the aortic arch, old CABG changes with prior LAD coronary artery stenting, and a left atrial appendage clip, all of which were seen previously. Both lungs are clear. The visualized skeletal structures are unremarkable apart from thoracic spondylosis and mild chronic anterior wedging of the T10 and T11 vertebral bodies. IMPRESSION: No evidence of acute chest disease. Aortic atherosclerosis. Old CABG changes. Electronically Signed   By: Almira Bar M.D.   On: 03/17/2023 00:37     PROCEDURES:  Critical Care performed: No    Procedures    IMPRESSION / MDM / ASSESSMENT AND PLAN / ED COURSE  I reviewed the triage vital signs and the nursing notes.    Patient here with wound dehiscence.  Also having cough and congestion.    DIFFERENTIAL DIAGNOSIS (includes but not limited to):   Wound dehiscence, no sign of scrotal abscess or cellulitis, doubt Fournier's gangrene  Viral URI, pneumonia, less likely CHF   Patient's presentation is most consistent with acute complicated illness / injury requiring diagnostic workup.   PLAN: Labs obtained from triage show white blood cell count of 10.6.  He has chronic kidney disease which is stable.  Lactic of 1.9.  COVID, flu and RSV negative.  Chest x-ray reviewed and interpreted by myself and the radiologist and is clear.  Discussed with patient that the small amount of yellow drainage he is seeing on exam is  normal from normal granulation tissue which is normal healing tissue however patient is adamant that when he squeezes his scrotum he gets pus.  There is no sign of abscess or cellulitis on exam.  Discussed with patient that we cannot suture this wound closed in the ED and that it will have to heal by secondary intention and that he will need to follow-up with his urologist.  Will put him on a course of Keflex in case there is any developing infection given he reports purulent drainage and will send a wound culture.  He does not appear toxic or septic today and I feel he can be followed as an outpatient.  As for his cough and congestion, no indication for antibiotics today as there are no signs of pneumonia.  He does not appear volume overloaded.  He is not hypoxic.  He has no increased work of breathing.   MEDICATIONS GIVEN IN ED: Medications  cephALEXin (KEFLEX) capsule 500 mg (500 mg Oral Given 03/17/23 0536)  HYDROcodone-acetaminophen (NORCO/VICODIN) 5-325 MG per tablet 2 tablet (2 tablets Oral Given 03/17/23 0536)  ondansetron (ZOFRAN-ODT) disintegrating tablet 4 mg (4 mg Oral Given 03/17/23 0535)     ED COURSE:  At this time, I do not feel there is any life-threatening condition present. I reviewed all nursing notes, vitals, pertinent previous records.  All lab  and urine results, EKGs, imaging ordered have been independently reviewed and interpreted by myself.  I reviewed all available radiology reports from any imaging ordered this visit.  Based on my assessment, I feel the patient is safe to be discharged home without further emergent workup and can continue workup as an outpatient as needed. Discussed all findings, treatment plan as well as usual and customary return precautions.  They verbalize understanding and are comfortable with this plan.  Outpatient follow-up has been provided as needed.  All questions have been answered.    CONSULTS:  none   OUTSIDE RECORDS REVIEWED: Reviewed recent  urology notes.       FINAL CLINICAL IMPRESSION(S) / ED DIAGNOSES   Final diagnoses:  Wound dehiscence     Rx / DC Orders   ED Discharge Orders          Ordered    HYDROcodone-acetaminophen (NORCO/VICODIN) 5-325 MG tablet  Every 8 hours PRN        03/17/23 0537    ondansetron (ZOFRAN-ODT) 4 MG disintegrating tablet  Every 6 hours PRN        03/17/23 0537    cephALEXin (KEFLEX) 500 MG capsule  4 times daily        03/17/23 1027             Note:  This document was prepared using Dragon voice recognition software and may include unintentional dictation errors.   Tanzie Rothschild, Layla Maw, DO 03/17/23 0630

## 2023-03-17 NOTE — Discharge Instructions (Addendum)

## 2023-03-20 ENCOUNTER — Ambulatory Visit (INDEPENDENT_AMBULATORY_CARE_PROVIDER_SITE_OTHER): Payer: Medicaid Other | Admitting: Physician Assistant

## 2023-03-20 VITALS — BP 118/80 | HR 80 | Ht 72.0 in | Wt 239.8 lb

## 2023-03-20 DIAGNOSIS — L723 Sebaceous cyst: Secondary | ICD-10-CM | POA: Diagnosis not present

## 2023-03-20 LAB — AEROBIC CULTURE W GRAM STAIN (SUPERFICIAL SPECIMEN)

## 2023-03-20 NOTE — Progress Notes (Signed)
03/20/2023 5:02 PM   Logan Crawford 03-23-1975 [id number]  CC: Chief Complaint  Patient presents with   Follow-up   HPI: Logan Crawford is a 48 y.o. male s/p excision of 2 scrotal sebaceous cysts with Dr. Richardo Hanks 14 days ago who presents today for follow-up of wound dehiscence.  He was seen in the ED 3 days ago with reports that his incisions were reopened about a week ago.  He was started on Keflex for possible infection.  Today he reports he remains on Keflex as prescribed.  He denies redness, pain, or fevers.  On physical exam, there is serous fluid draining from a larger open wound at the penoscrotal junction.  A much smaller wound of the posterior scrotum is nearly healed.  No surrounding erythema, scrotal edema, or malodor associated with these.  PMH: Past Medical History:  Diagnosis Date   (HFpEF) heart failure with preserved ejection fraction (HCC)    a.) TTE 11/26/19: EF 50-55, G1DD; b.) TTE 01/24/20: EF 50, LVH, mid-apical antsep HK, G1DD; c.) TTE 05/18/21: EF 50-55, antsep/sep/apical HK, LVH, mild MR, AoV calc; d.) TTE 07/16/21: EF 60-65, LVH, G2DD, mild MD; e.) TTE 02/18/22: EF 55, sep/inf HK, LVH, G1DD, mild MR; f.) TTE 03/19/22: EF 65-70, antsep/periapical HK, LVH, G1DD, mild MR, AoV calc; g.) TTE 02/12/2023: EF 55-60, LVH, G2DD, mild MR   Anginal pain (HCC)    Aortic atherosclerosis (HCC)    Bilateral pleural effusion 02/20/2022   a.) s/p CABG and LAA closure (L > R) --> progressive during admission --> ultimately required LEFT thoracentesis on 03/10/2022 (750 cc serous yield)   CAD (coronary artery disease)    a.)PCI 01/24/20: 90 mLAD (2.75x22 Res Onyx); b.)PCI 05/20/21: 85 mLAD (2.75x24 + 2.5x8 Onyx); c.)LHC 11/18/21: 80 ISR + 60 mLAD - CVTS vs stent POBA; d.)LHC 02/13/22: 70 ISR p-mLAD, 80/50 ISR mLAD, 99 ISR dLAD, 80 jailed D2, diff HG RCA Dz; e.)3v CABG 02/19/22; f.)LHC 02/03/23: 30 LM, 70 p-mLAD, 80/70 mLAD, 99 dLAD, 80 D2, 70/70 pRCA, 70/95 mRCA, 40 dRCA, 60  RV branch, 60 RPDA, 100 SVG-LAD - Rx mgmt   CKD (chronic kidney disease), stage III (HCC)    Complete heart block (HCC) 02/19/2022   a.) during CABG/LAA closure --> hyperkalemic CHB in OR before heart surgery had functionally begun (not on bypass yet); etiology unknown; ?? due to medication administration or contaminant given fairly normal preop renal function (Cr 1.3). Persisted post-bypass event with extensive dialysis equivalent via CPB pump --> Tx'd with dantrolene   Complication of anesthesia 02/19/2022   a.) perioperative HYPERkalemia - concern for atypical MH initially - Tx'd intraoperaitvely with dantrolene. Preop K+ 4.6 and CR 1.3 --> K+ peaked at 7.8 intra/postop --> rec'd furosemide + insulin + calcium + extended CBP (remained on pump at least 1 hour after procedure). Consult with MH society determined "unlikely" MH (see Argwala, MD note 02/22/2022). No confirmatory CHCT testing.   COPD (chronic obstructive pulmonary disease) (HCC)    Genital warts    GERD (gastroesophageal reflux disease)    Hiatal hernia    History of 2019 novel coronavirus disease (COVID-19) 11/25/2019   History of left atrial appendage closure 02/19/2022   a.) performed simultaneously with cardiac revascularization (CABG) procedure on 02/19/2022   Hyperlipidemia    Hypertension    Ischemic cardiomyopathy    a.) TTE 11/26/19: EF 50-55; b.)TTE 01/24/20: EF 50; c.)LHC 01/24/20: EF 45-50; d.)MV 11/29/20: EF 43; e.)TTE 05/18/21: EF 50-55; f.)LHC 05/20/21: EF 50-55; g.)TTE 07/16/21:  EF 60-65; h.) TTE 11/16/21: EF 60-65; i.) LHC 11/18/21: EF 55-65; j.) TTE 02/12/22: EF 60-65; k.) TTE 02/18/22: EF 55; l.) TTE 03/19/22: EF 65-70; m.) TTE 05/01/22: EF 55; n.) MV 01/27/23: EF 45-50; o.) TTE 02/12/2023: EF 55-60   Leukocytosis    Long term current use of clopidogrel    Long-term use of aspirin therapy    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   a.) surgical PCR (+) 02/15/2022 prior to CABG   NSTEMI (non-ST elevated myocardial  infarction) (HCC) 01/23/2020   a.) troponins were trended: 3520 --> 2990 ng/L; b.) LHC/PCI 01/24/2020: 60% o-pRCA, 40% p-mRCA, 90% mLAD (2.75 x 22 mm Resolute Onyx DES), 40% dLAD   NSTEMI (non-ST elevated myocardial infarction) (HCC) 05/18/2021   a.) troponins were trended: 101 --> 110 --> 175 ng/L; b.) LHC/PCI 05/20/2021: 40% dLAD, 40% p-mRCA, 50% o-pRCA, 40% RPDA, 30% oLM, 85% mLAD with distal edge dissection (overlapping 2.75 x 34 mm and 2.5 x 8 mm Onyx Frontier DES), 60% D2   OSA on CPAP    Polysubstance abuse (HCC)    a.) cocaine, THC, opiates, tobacco   Postoperative cardiogenic shock (HCC) 02/19/2022   a.) required vasopressor/ionotropic agent support + IABP   Recurrent cellulitis of thigh    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 Consult with MH society determined MH unlikely (see Argwala, MD note 02/22/2022); b.) c/b postop cardiogenic shock req vasopressor/inotrophic support + IABP; c.) c/b dev of BILATERAL pleural effusions (L > R) req LEFT thoracentesis (750 cc yield)   Scrotal lesion (sebaceous cyst)    Tobacco use 11/25/2019    Surgical History: Past Surgical History:  Procedure Laterality Date   CORONARY ARTERY BYPASS GRAFT N/A 02/19/2022   Procedure: CORONARY ARTERY BYPASS GRAFTING (CABG) X THREE, USING ENDOSCOPICALLY HARVESTED RIGHT GREATER SAPHENOUS VEIN  AND LEFT ATRIAL APPENDAGE CLIPPING;  Surgeon: Lyn Hollingshead, MD;  Location: [facility] OR;  Service: Open Heart Surgery;  Laterality: N/A;   CORONARY STENT INTERVENTION N/A 01/24/2020   Procedure: CORONARY STENT INTERVENTION;  Surgeon: Iran Ouch, MD;  Location: [facility];  Service: Cardiovascular;  Laterality: N/A;   CORONARY STENT INTERVENTION N/A 05/20/2021   Procedure: CORONARY STENT INTERVENTION;  Surgeon: Iran Ouch, MD;  Location: [facility];  Service:  Cardiovascular;  Laterality: N/A;   CORONARY ULTRASOUND/IVUS N/A 05/20/2021   Procedure: Intravascular Ultrasound/IVUS;  Surgeon: Iran Ouch, MD;  Location: [facility];  Service: Cardiovascular;  Laterality: N/A;   IRRIGATION AND DEBRIDEMENT KNEE Left    LEFT ATRIAL APPENDAGE OCCLUSION Left 02/19/2022   Procedure: LEFT ATRIAL APPENDAGE OCCLUSION; Location: [facility]; Surgeon: Clare Charon, MD   LEFT HEART CATH AND CORONARY ANGIOGRAPHY N/A 01/24/2020   Procedure: LEFT HEART CATH AND CORONARY ANGIOGRAPHY;  Surgeon: Iran Ouch, MD;  Location: [facility];  Service: Cardiovascular;  Laterality: N/A;   LEFT HEART CATH AND CORONARY ANGIOGRAPHY N/A 11/18/2021   Procedure: LEFT HEART CATH AND CORONARY ANGIOGRAPHY;  Surgeon: Iran Ouch, MD;  Location: [facility];  Service: Cardiovascular;  Laterality: N/A;   LEFT HEART CATH AND CORONARY ANGIOGRAPHY N/A 02/13/2022   Procedure: LEFT HEART CATH AND CORONARY ANGIOGRAPHY;  Surgeon: Marykay Lex, MD;  Location: [facility];  Service: Cardiovascular;  Laterality: N/A;   LEFT HEART CATH AND CORS/GRAFTS ANGIOGRAPHY N/A 05/20/2021  Procedure: LEFT HEART CATH AND CORS/GRAFTS ANGIOGRAPHY;  Surgeon: Iran Ouch, MD;  Location: [facility];  Service: Cardiovascular;  Laterality: N/A;   LEFT HEART CATH AND CORS/GRAFTS ANGIOGRAPHY N/A 02/03/2023   Procedure: LEFT HEART CATH AND CORS/GRAFTS ANGIOGRAPHY;  Surgeon: Yvonne Kendall, MD;  Location: [facility];  Service: Cardiovascular;  Laterality: N/A;   SCROTAL EXPLORATION N/A 03/06/2023   Procedure: EXCISION OF SCROTUM LESION, SEBACEOUS CYST;  Surgeon: Sondra Come, MD;  Location: [facility];  Service: Urology;  Laterality: N/A;   TYMPANOSTOMY TUBE PLACEMENT Bilateral     Home Medications:  Allergies as of 03/20/2023       Reactions   Bee Pollen    Patient states he is not allergic to bees but has seasonal allergies to pollen         Medication List        Accurate as of March 20, 2023  5:02 PM. If you have any questions, ask your nurse or doctor.          aspirin EC 81 MG tablet Take 1 tablet (81 mg total) by mouth daily. Swallow whole.   cephALEXin 500 MG capsule Commonly known as: KEFLEX Take 1 capsule (500 mg total) by mouth 4 (four) times daily for 7 days.   clopidogrel 75 MG tablet Commonly known as: PLAVIX Take 1 tablet (75 mg total) by mouth daily.   famotidine 20 MG tablet Commonly known as: PEPCID Take 1 tablet (20 mg total) by mouth at bedtime as needed for heartburn or indigestion. What changed: when to take this   furosemide 40 MG tablet Commonly known as: LASIX Take 1 tablet (40 mg total) by mouth daily.   gabapentin 100 MG capsule Commonly known as: NEURONTIN Take 100 mg by mouth 2 (two) times daily as needed (pain).   HYDROcodone-acetaminophen 5-325 MG tablet Commonly known as: NORCO/VICODIN Take 2 tablets by mouth every 8 (eight) hours as needed.   imiquimod 5 % cream Commonly known as: ALDARA Apply 1 Application topically daily as needed (irritation).   isosorbide mononitrate 30 MG 24 hr tablet Commonly known as: IMDUR Take 1 tablet (30 mg total) by mouth daily.   Jardiance 10 MG Tabs tablet Generic drug: empagliflozin Take 10 mg by mouth daily.   losartan 50 MG tablet Commonly known as: COZAAR Take 1 tablet (50 mg total) by mouth daily.   metoprolol succinate 25 MG 24 hr tablet Commonly known as: TOPROL-XL Take 2 tablets (50 mg total) by mouth daily. Stop taking metoprolol if you continue to use cocaine   nitroGLYCERIN 0.4 MG SL tablet Commonly known as: NITROSTAT Place 1 tablet (0.4 mg total) under the tongue every 5 (five) minutes as needed for chest pain (if chest pain not resolved after 3 doses, please call 911).   NON FORMULARY Pt uses a bi-pap nightly   nystatin powder Commonly known as: MYCOSTATIN/NYSTOP Apply 1 Application topically 3  (three) times daily.   ondansetron 4 MG disintegrating tablet Commonly known as: ZOFRAN-ODT Take 1 tablet (4 mg total) by mouth every 6 (six) hours as needed for nausea or vomiting.   pantoprazole 40 MG tablet Commonly known as: PROTONIX Take 1 tablet by mouth once daily   rosuvastatin 40 MG tablet Commonly known as: CRESTOR Take 1 tablet (40 mg total) by mouth daily.   spironolactone 25 MG tablet Commonly known as: ALDACTONE Take 1 tablet (25 mg total) by mouth daily.   Trelegy Ellipta 100-62.5-25 MCG/ACT Aepb Generic  drug: Fluticasone-Umeclidin-Vilant Inhale 1 puff into the lungs daily.   triamcinolone 0.025 % cream Commonly known as: KENALOG APPLY TOPICALLY TWICE DAILY   Ventolin HFA 108 (90 Base) MCG/ACT inhaler Generic drug: albuterol Inhale 1-2 puffs into the lungs every 6 (six) hours as needed for wheezing or shortness of breath.        Allergies:  Allergies  Allergen Reactions   Bee Pollen     Patient states he is not allergic to bees but has seasonal allergies to pollen    Family History: Family History  Problem Relation Age of Onset   Coronary artery disease Mother    Arrhythmia Mother        Pacemaker placed in 2021   Heart failure Mother    Breast cancer Mother    Coronary artery disease Father    Colon cancer Father    Colon polyps Father     Social History:   reports that he has been smoking cigarettes. He started smoking about 10 years ago. He has a 10.1 pack-year smoking history. He has been exposed to tobacco smoke. He has never used smokeless tobacco. He reports current alcohol use of about 1.0 standard drink of alcohol per week. He reports current drug use.  Physical Exam: BP 118/80   Pulse 80   Ht 6' (1.829 m)   Wt 239 lb 12.8 oz (108.8 kg)   BMI 32.52 kg/m   Constitutional:  Alert and oriented, no acute distress, nontoxic appearing HEENT: [place], AT Cardiovascular: No clubbing, cyanosis, or edema Respiratory: Normal respiratory  effort, no increased work of breathing Skin: No rashes, bruises or suspicious lesions Neurologic: Grossly intact, no focal deficits, moving all 4 extremities Psychiatric: Normal mood and affect  Assessment & Plan:   1. Sebaceous cyst of scrotum (Primary) Postoperative wound dehiscence.  Agree with Keflex as previously prescribed.  Low suspicion for infection today.  We discussed that these need to heal through secondary intention.  His posterior wound has nearly reapproximated so I do not think he needs to pack it.  We did discuss packing his larger wound and allowing it to heal on its own.  He expressed understanding.  I gave him supplies today.  I will see him back in 2 weeks for wound recheck.  We discussed return precautions including new redness, drainage, fevers, or pain.  Return in about 2 weeks (around 04/03/2023) for Wound check.  Carman Ching, PA-C  [facility] [address] [place], [place] [address] [phone]

## 2023-03-24 ENCOUNTER — Telehealth: Payer: Self-pay

## 2023-03-24 ENCOUNTER — Ambulatory Visit: Payer: Medicaid Other | Admitting: Student

## 2023-03-24 NOTE — Telephone Encounter (Signed)
Marland Kitchen

## 2023-03-25 ENCOUNTER — Telehealth: Payer: Self-pay

## 2023-03-25 NOTE — Transitions of Care (Post Inpatient/ED Visit) (Cosign Needed)
   03/25/2023  Name: Logan Crawford MRN: [id number] DOB: 05/30/1975  Today's TOC FU Call Status: Today's TOC FU Call Status:: Unsuccessful Call (2nd Attempt) Unsuccessful Call (2nd Attempt) Date: 03/25/23  Attempted to reach the patient regarding the most recent Inpatient/ED visit.  Follow Up Plan: Additional outreach attempts will be made to reach the patient to complete the Transitions of Care (Post Inpatient/ED visit) call.   Signature Renelda Loma RMA

## 2023-04-03 ENCOUNTER — Encounter: Payer: Self-pay | Admitting: Physician Assistant

## 2023-04-03 ENCOUNTER — Ambulatory Visit: Payer: Medicaid Other | Admitting: Physician Assistant

## 2023-04-03 VITALS — BP 121/86 | HR 86 | Ht 72.0 in | Wt 238.0 lb

## 2023-04-03 DIAGNOSIS — L723 Sebaceous cyst: Secondary | ICD-10-CM

## 2023-04-03 NOTE — Progress Notes (Signed)
 04/03/2023 3:38 PM   Logan Crawford 24-Jan-1976 [id number]  CC: Chief Complaint  Patient presents with   Other    Wound check   HPI: Logan Crawford is a 48 y.o. male s/p excision of 2 scrotal sebaceous cysts with Dr. Francisca on 03/06/2023 who developed postop wound dehiscence who presents today for wound recheck.  Today he reports He remains on antibiotics and continues to pack the larger wound daily.  He has no acute concerns.  He has not noticed a huge change in the wound since his last office visit.  PMH: Past Medical History:  Diagnosis Date   (HFpEF) heart failure with preserved ejection fraction (HCC)    a.) TTE 11/26/19: EF 50-55, G1DD; b.) TTE 01/24/20: EF 50, LVH, mid-apical antsep HK, G1DD; c.) TTE 05/18/21: EF 50-55, antsep/sep/apical HK, LVH, mild MR, AoV calc; d.) TTE 07/16/21: EF 60-65, LVH, G2DD, mild MD; e.) TTE 02/18/22: EF 55, sep/inf HK, LVH, G1DD, mild MR; f.) TTE 03/19/22: EF 65-70, antsep/periapical HK, LVH, G1DD, mild MR, AoV calc; g.) TTE 02/12/2023: EF 55-60, LVH, G2DD, mild MR   Anginal pain (HCC)    Aortic atherosclerosis (HCC)    Bilateral pleural effusion 02/20/2022   a.) s/p CABG and LAA closure (L > R) --> progressive during admission --> ultimately required LEFT thoracentesis on 03/10/2022 (750 cc serous yield)   CAD (coronary artery disease)    a.)PCI 01/24/20: 90 mLAD (2.75x22 Res Onyx); b.)PCI 05/20/21: 85 mLAD (2.75x24 + 2.5x8 Onyx); c.)LHC 11/18/21: 80 ISR + 60 mLAD - CVTS vs stent POBA; d.)LHC 02/13/22: 70 ISR p-mLAD, 80/50 ISR mLAD, 99 ISR dLAD, 80 jailed D2, diff HG RCA Dz; e.)3v CABG 02/19/22; f.)LHC 02/03/23: 30 LM, 70 p-mLAD, 80/70 mLAD, 99 dLAD, 80 D2, 70/70 pRCA, 70/95 mRCA, 40 dRCA, 60 RV branch, 60 RPDA, 100 SVG-LAD - Rx mgmt   CKD (chronic kidney disease), stage III (HCC)    Complete heart block (HCC) 02/19/2022   a.) during CABG/LAA closure --> hyperkalemic CHB in OR before heart surgery had functionally begun (not on bypass yet);  etiology unknown; ?? due to medication administration or contaminant given fairly normal preop renal function (Cr 1.3). Persisted post-bypass event with extensive dialysis equivalent via CPB pump --> Tx'd with dantrolene    Complication of anesthesia 02/19/2022   a.) perioperative HYPERkalemia - concern for atypical MH initially - Tx'd intraoperaitvely with dantrolene . Preop K+ 4.6 and CR 1.3 --> K+ peaked at 7.8 intra/postop --> rec'd furosemide  + insulin  + calcium  + extended CBP (remained on pump at least 1 hour after procedure). Consult with MH society determined unlikely MH (see Argwala, MD note 02/22/2022). No confirmatory CHCT testing.   COPD (chronic obstructive pulmonary disease) (HCC)    Genital warts    GERD (gastroesophageal reflux disease)    Hiatal hernia    History of 2019 novel coronavirus disease (COVID-19) 11/25/2019   History of left atrial appendage closure 02/19/2022   a.) performed simultaneously with cardiac revascularization (CABG) procedure on 02/19/2022   Hyperlipidemia    Hypertension    Ischemic cardiomyopathy    a.) TTE 11/26/19: EF 50-55; b.)TTE 01/24/20: EF 50; c.)LHC 01/24/20: EF 45-50; d.)MV 11/29/20: EF 43; e.)TTE 05/18/21: EF 50-55; f.)LHC 05/20/21: EF 50-55; g.)TTE 07/16/21: EF 60-65; h.) TTE 11/16/21: EF 60-65; i.) LHC 11/18/21: EF 55-65; j.) TTE 02/12/22: EF 60-65; k.) TTE 02/18/22: EF 55; l.) TTE 03/19/22: EF 65-70; m.) TTE 05/01/22: EF 55; n.) MV 01/27/23: EF 45-50; o.) TTE 02/12/2023: EF 55-60  Leukocytosis    Long term current use of clopidogrel     Long-term use of aspirin  therapy    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   a.) surgical PCR (+) 02/15/2022 prior to CABG   NSTEMI (non-ST elevated myocardial infarction) (HCC) 01/23/2020   a.) troponins were trended: 3520 --> 2990 ng/L; b.) LHC/PCI 01/24/2020: 60% o-pRCA, 40% p-mRCA, 90% mLAD (2.75 x 22 mm Resolute Onyx DES), 40% dLAD   NSTEMI (non-ST elevated myocardial infarction) (HCC) 05/18/2021   a.)  troponins were trended: 101 --> 110 --> 175 ng/L; b.) LHC/PCI 05/20/2021: 40% dLAD, 40% p-mRCA, 50% o-pRCA, 40% RPDA, 30% oLM, 85% mLAD with distal edge dissection (overlapping 2.75 x 34 mm and 2.5 x 8 mm Onyx Frontier DES), 60% D2   OSA on CPAP    Polysubstance abuse (HCC)    a.) cocaine, THC, opiates, tobacco   Postoperative cardiogenic shock (HCC) 02/19/2022   a.) required vasopressor/ionotropic agent support + IABP   Recurrent cellulitis of thigh    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 Consult with MH society determined MH unlikely (see Argwala, MD note 02/22/2022); b.) c/b postop cardiogenic shock req vasopressor/inotrophic support + IABP; c.) c/b dev of BILATERAL pleural effusions (L > R) req LEFT thoracentesis (750 cc yield)   Scrotal lesion (sebaceous cyst)    Tobacco use 11/25/2019    Surgical History: Past Surgical History:  Procedure Laterality Date   CORONARY ARTERY BYPASS GRAFT N/A 02/19/2022   Procedure: CORONARY ARTERY BYPASS GRAFTING (CABG) X THREE, USING ENDOSCOPICALLY HARVESTED RIGHT GREATER SAPHENOUS VEIN  AND LEFT ATRIAL APPENDAGE CLIPPING;  Surgeon: Murriel Toribio DEL, MD;  Location: [facility];  Service: Open Heart Surgery;  Laterality: N/A;   CORONARY STENT INTERVENTION N/A 01/24/2020   Procedure: CORONARY STENT INTERVENTION;  Surgeon: Darron Deatrice LABOR, MD;  Location: [facility];  Service: Cardiovascular;  Laterality: N/A;   CORONARY STENT INTERVENTION N/A 05/20/2021   Procedure: CORONARY STENT INTERVENTION;  Surgeon: Darron Deatrice LABOR, MD;  Location: [facility];  Service: Cardiovascular;  Laterality: N/A;   CORONARY ULTRASOUND/IVUS N/A 05/20/2021   Procedure: Intravascular Ultrasound/IVUS;  Surgeon: Darron Deatrice LABOR, MD;  Location: [facility];  Service: Cardiovascular;  Laterality: N/A;   IRRIGATION AND  DEBRIDEMENT KNEE Left    LEFT ATRIAL APPENDAGE OCCLUSION Left 02/19/2022   Procedure: LEFT ATRIAL APPENDAGE OCCLUSION; Location: [facility]; Surgeon: Toribio Murriel, MD   LEFT HEART CATH AND CORONARY ANGIOGRAPHY N/A 01/24/2020   Procedure: LEFT HEART CATH AND CORONARY ANGIOGRAPHY;  Surgeon: Darron Deatrice LABOR, MD;  Location: [facility];  Service: Cardiovascular;  Laterality: N/A;   LEFT HEART CATH AND CORONARY ANGIOGRAPHY N/A 11/18/2021   Procedure: LEFT HEART CATH AND CORONARY ANGIOGRAPHY;  Surgeon: Darron Deatrice LABOR, MD;  Location: [facility];  Service: Cardiovascular;  Laterality: N/A;   LEFT HEART CATH AND CORONARY ANGIOGRAPHY N/A 02/13/2022   Procedure: LEFT HEART CATH AND CORONARY ANGIOGRAPHY;  Surgeon: Anner Alm ORN, MD;  Location: [facility];  Service: Cardiovascular;  Laterality: N/A;   LEFT HEART CATH AND CORS/GRAFTS ANGIOGRAPHY N/A 05/20/2021   Procedure: LEFT HEART CATH AND CORS/GRAFTS ANGIOGRAPHY;  Surgeon: Darron Deatrice LABOR, MD;  Location: [facility];  Service: Cardiovascular;  Laterality: N/A;   LEFT HEART CATH AND CORS/GRAFTS ANGIOGRAPHY N/A 02/03/2023   Procedure: LEFT HEART CATH AND CORS/GRAFTS  ANGIOGRAPHY;  Surgeon: Mady Bruckner, MD;  Location: [facility];  Service: Cardiovascular;  Laterality: N/A;   SCROTAL EXPLORATION N/A 03/06/2023   Procedure: EXCISION OF SCROTUM LESION, SEBACEOUS CYST;  Surgeon: Francisca Redell BROCKS, MD;  Location: [facility];  Service: Urology;  Laterality: N/A;   TYMPANOSTOMY TUBE PLACEMENT Bilateral     Home Medications:  Allergies as of 04/03/2023       Reactions   Bee Pollen    Patient states he is not allergic to bees but has seasonal allergies to pollen        Medication List        Accurate as of April 03, 2023  3:38 PM. If you have any questions, ask your nurse or doctor.          aspirin  EC 81 MG tablet Take 1 tablet (81 mg total) by mouth daily. Swallow whole.   clopidogrel  75  MG tablet Commonly known as: PLAVIX  Take 1 tablet (75 mg total) by mouth daily.   famotidine  20 MG tablet Commonly known as: PEPCID  Take 1 tablet (20 mg total) by mouth at bedtime as needed for heartburn or indigestion. What changed: when to take this   furosemide  40 MG tablet Commonly known as: LASIX  Take 1 tablet (40 mg total) by mouth daily.   gabapentin  100 MG capsule Commonly known as: NEURONTIN  Take 100 mg by mouth 2 (two) times daily as needed (pain).   HYDROcodone -acetaminophen  5-325 MG tablet Commonly known as: NORCO/VICODIN Take 2 tablets by mouth every 8 (eight) hours as needed.   imiquimod  5 % cream Commonly known as: ALDARA  Apply 1 Application topically daily as needed (irritation).   isosorbide  mononitrate 30 MG 24 hr tablet Commonly known as: IMDUR  Take 1 tablet (30 mg total) by mouth daily.   Jardiance  10 MG Tabs tablet Generic drug: empagliflozin  Take 10 mg by mouth daily.   losartan  50 MG tablet Commonly known as: COZAAR  Take 1 tablet (50 mg total) by mouth daily.   metoprolol  succinate 25 MG 24 hr tablet Commonly known as: TOPROL -XL Take 2 tablets (50 mg total) by mouth daily. Stop taking metoprolol  if you continue to use cocaine   nitroGLYCERIN  0.4 MG SL tablet Commonly known as: NITROSTAT  Place 1 tablet (0.4 mg total) under the tongue every 5 (five) minutes as needed for chest pain (if chest pain not resolved after 3 doses, please call 911).   NON FORMULARY Pt uses a bi-pap nightly   nystatin  powder Commonly known as: MYCOSTATIN /NYSTOP  Apply 1 Application topically 3 (three) times daily.   ondansetron  4 MG disintegrating tablet Commonly known as: ZOFRAN -ODT Take 1 tablet (4 mg total) by mouth every 6 (six) hours as needed for nausea or vomiting.   pantoprazole  40 MG tablet Commonly known as: PROTONIX  Take 1 tablet by mouth once daily   rosuvastatin  40 MG tablet Commonly known as: CRESTOR  Take 1 tablet (40 mg total) by mouth daily.    spironolactone  25 MG tablet Commonly known as: ALDACTONE  Take 1 tablet (25 mg total) by mouth daily.   Trelegy Ellipta  100-62.5-25 MCG/ACT Aepb Generic drug: Fluticasone -Umeclidin-Vilant Inhale 1 puff into the lungs daily.   triamcinolone  0.025 % cream Commonly known as: KENALOG  APPLY TOPICALLY TWICE DAILY   Ventolin  HFA 108 (90 Base) MCG/ACT inhaler Generic drug: albuterol  Inhale 1-2 puffs into the lungs every 6 (six) hours as needed for wheezing or shortness of breath.        Allergies:  Allergies  Allergen Reactions  Bee Pollen     Patient states he is not allergic to bees but has seasonal allergies to pollen    Family History: Family History  Problem Relation Age of Onset   Coronary artery disease Mother    Arrhythmia Mother        Pacemaker placed in 2021   Heart failure Mother    Breast cancer Mother    Coronary artery disease Father    Colon cancer Father    Colon polyps Father     Social History:   reports that he has been smoking cigarettes. He started smoking about 10 years ago. He has a 10.1 pack-year smoking history. He has been exposed to tobacco smoke. He has never used smokeless tobacco. He reports current alcohol use of about 1.0 standard drink of alcohol per week. He reports current drug use.  Physical Exam: BP 121/86   Pulse 86   Ht 6' (1.829 m)   Wt 238 lb (108 kg)   BMI 32.28 kg/m   Constitutional:  Alert and oriented, no acute distress, nontoxic appearing HEENT: [place], AT Cardiovascular: No clubbing, cyanosis, or edema Respiratory: Normal respiratory effort, no increased work of breathing GU: Both cyst excision sites have diminished in size since prior.  There is healthy appearing granulation at both wound beds.  Decreased drainage from the larger wound.  No surrounding erythema or scrotal thickening. Skin: No rashes, bruises or suspicious lesions Neurologic: Grossly intact, no focal deficits, moving all 4 extremities Psychiatric: Normal  mood and affect  Assessment & Plan:   1. Sebaceous cyst of scrotum (Primary) His wounds continue to heal well.  They have diminished somewhat in size since last time.  Will plan for recheck in another 2 weeks.  Counseled him to complete antibiotics as prescribed and continue packing the larger wound daily.  Return in about 2 weeks (around 04/17/2023) for Wound check.  Lucie Hones, PA-C  [facility] [address], [address] [place], [place] [address] [phone]

## 2023-04-09 ENCOUNTER — Telehealth: Payer: Self-pay | Admitting: Internal Medicine

## 2023-04-09 NOTE — Telephone Encounter (Signed)
Pt confirmed appt for 04/10/23

## 2023-04-10 ENCOUNTER — Ambulatory Visit: Payer: Medicaid Other | Attending: Internal Medicine | Admitting: Internal Medicine

## 2023-04-10 VITALS — BP 118/64 | HR 78 | Wt 240.0 lb

## 2023-04-10 DIAGNOSIS — N183 Chronic kidney disease, stage 3 unspecified: Secondary | ICD-10-CM | POA: Diagnosis not present

## 2023-04-10 DIAGNOSIS — Z951 Presence of aortocoronary bypass graft: Secondary | ICD-10-CM | POA: Diagnosis not present

## 2023-04-10 DIAGNOSIS — I25810 Atherosclerosis of coronary artery bypass graft(s) without angina pectoris: Secondary | ICD-10-CM

## 2023-04-10 DIAGNOSIS — I5032 Chronic diastolic (congestive) heart failure: Secondary | ICD-10-CM | POA: Diagnosis not present

## 2023-04-10 DIAGNOSIS — G4733 Obstructive sleep apnea (adult) (pediatric): Secondary | ICD-10-CM

## 2023-04-10 DIAGNOSIS — Z720 Tobacco use: Secondary | ICD-10-CM

## 2023-04-10 MED ORDER — ISOSORBIDE MONONITRATE ER 30 MG PO TB24: 30.0000 mg | ORAL_TABLET | Freq: Every day | ORAL | 11 refills | Status: DC

## 2023-04-10 NOTE — Patient Instructions (Signed)
Great to see you today!!  Continue current medications, no changes at this time  Your physician recommends that you schedule a follow-up appointment in: 3 months (May), we will call you closer to this time to schedule   If you have any questions or concerns before your next appointment please send [place] a message through [place] or call our office at [phone] Monday-Friday 8 am-5 pm.   If you have an urgent need after hours on the weekend please call your Primary Cardiologist or the [facility] in [place] at [phone].   At the [facility], you and your health needs are our priority. We have a designated team specialized in the treatment of Heart Failure. This Care Team includes your primary Heart Failure Specialized Cardiologist (physician), Advanced Practice Providers (APPs- Physician Assistants and Nurse Practitioners), and Pharmacist who all work together to provide you with the care you need, when you need it.   You may see any of the following providers on your designated Care Team at your next follow up:  Dr. Arvilla Meres Dr. Marca Ancona Dr. Dorthula Nettles Dr. Theresia Bough Tonye Becket, NP Robbie Lis, [place] [address] [place], [place] Brynda Peon, NP [place] Lee, NP Clarisa Kindred, NP Enos Fling, PharmD

## 2023-04-10 NOTE — Progress Notes (Signed)
Advanced Heart Failure Clinic Note   Referring Physician: Dr. Azucena Cecil PCP: Donell Beers, FNP PCP-Cardiologist: Debbe Odea, MD   HPI:  [name] is a 48 y.o. male with HTN, polysubstance abuse, tobacco use disorder, CAD s/p PCI in 04/2021, CABG in 12/23.   Hospitalized from 9/23 with NSTEMI. Cath on 9/25 showing significant two-vessel coronary artery disease which was  treated medically and on DAPT. Readmitted 12/23 with NSTEMI.  Cath 02/13/22 which showed recurrent in-stent restenosis in the LAD in the setting of continued cocaine use and poor compliance with antiplatelet medications.  Patient also has significant progression of the RCA disease.  Pre-op EF normal.   Underwent CAG 02/19/22  (LIMA - Diag, SVG - LAD, SVG - RCA). Operative course complicated by severe hyperkalemia (prior to cardiopulmonary bypass with question of malignant hyperthermia syndrome - received dantrolene). Early post-op course was notable for shock physiology and AKI. Required IABP and multiple gtts which were eventually weaned ECHO EF 55-60%. Also had some mild sternal drainage treated with abx Discharged 03/01/22.   I saw him last month and was volume overloaded with large left pleural effusion. Underwent tap with IR on 03/10/22 with 750cc out. Started on Furoscix  Saw General Cards 1/22 with concern for recurrent effusion. Stat CXR with trace effusion.   Admitted in 3/24 with CP. Felt to have pericarditis. Had mild volume overload. Echo 05/01/22  EF 55%  Seen earlier this week in Pulmonary Clinic and weight up 5 pounds so referred back top [facility] Clinic to reassess  Sleep study 5/24  showed severe  OSA with AHI 52/ hr  Here for f/u. Had pre-op stress test in 12/24 which was abnormal. Underwent cath  02/03/23 as below. Feels ok. Still gets tired at times. Had not been using BIPAP for a while because he felt he was getting choked. Got a new mask and now trying to wear it more. No CP, mild DOE. Trying to  get to gym at times. No edema, orthopnea or PND. Takes lasix 40 daily. Smoking about 1/4 ppd.   Severe two-vessel native coronary artery disease with up to 99% in-stent restenosis of the LAD and multifocal RCA disease of up to 95%.  Overall appearance is similar to pre-CABG native CAD. Widely patent LIMA-D2 (backfills LAD some) and SVG-RCA. Occluded SVG-LAD. Mildly elevated left ventricular filling pressure (LVEDP 22 mmHg).   Past Medical History:  Diagnosis Date   (HFpEF) heart failure with preserved ejection fraction (HCC)    a.) TTE 11/26/19: EF 50-55, G1DD; b.) TTE 01/24/20: EF 50, LVH, mid-apical antsep HK, G1DD; c.) TTE 05/18/21: EF 50-55, antsep/sep/apical HK, LVH, mild MR, AoV calc; d.) TTE 07/16/21: EF 60-65, LVH, G2DD, mild MD; e.) TTE 02/18/22: EF 55, sep/inf HK, LVH, G1DD, mild MR; f.) TTE 03/19/22: EF 65-70, antsep/periapical HK, LVH, G1DD, mild MR, AoV calc; g.) TTE 02/12/2023: EF 55-60, LVH, G2DD, mild MR   Anginal pain (HCC)    Aortic atherosclerosis (HCC)    Bilateral pleural effusion 02/20/2022   a.) s/p CABG and LAA closure (L > R) --> progressive during admission --> ultimately required LEFT thoracentesis on 03/10/2022 (750 cc serous yield)   CAD (coronary artery disease)    a.)PCI 01/24/20: 90 mLAD (2.75x22 Res Onyx); b.)PCI 05/20/21: 85 mLAD (2.75x24 + 2.5x8 Onyx); c.)LHC 11/18/21: 80 ISR + 60 mLAD - CVTS vs stent POBA; d.)LHC 02/13/22: 70 ISR p-mLAD, 80/50 ISR mLAD, 99 ISR dLAD, 80 jailed D2, diff HG RCA Dz; e.)3v CABG 02/19/22; f.)LHC 02/03/23:  30 LM, 70 p-mLAD, 80/70 mLAD, 99 dLAD, 80 D2, 70/70 pRCA, 70/95 mRCA, 40 dRCA, 60 RV branch, 60 RPDA, 100 SVG-LAD - Rx mgmt   CKD (chronic kidney disease), stage III (HCC)    Complete heart block (HCC) 02/19/2022   a.) during CABG/LAA closure --> hyperkalemic CHB in OR before heart surgery had functionally begun (not on bypass yet); etiology unknown; ?? due to medication administration or contaminant given fairly normal preop renal  function (Cr 1.3). Persisted post-bypass event with extensive dialysis equivalent via CPB pump --> Tx'd with dantrolene   Complication of anesthesia 02/19/2022   a.) perioperative HYPERkalemia - concern for atypical MH initially - Tx'd intraoperaitvely with dantrolene. Preop K+ 4.6 and CR 1.3 --> K+ peaked at 7.8 intra/postop --> rec'd furosemide + insulin + calcium + extended CBP (remained on pump at least 1 hour after procedure). Consult with MH society determined "unlikely" MH (see Argwala, MD note 02/22/2022). No confirmatory CHCT testing.   COPD (chronic obstructive pulmonary disease) (HCC)    Genital warts    GERD (gastroesophageal reflux disease)    Hiatal hernia    History of 2019 novel coronavirus disease (COVID-19) 11/25/2019   History of left atrial appendage closure 02/19/2022   a.) performed simultaneously with cardiac revascularization (CABG) procedure on 02/19/2022   Hyperlipidemia    Hypertension    Ischemic cardiomyopathy    a.) TTE 11/26/19: EF 50-55; b.)TTE 01/24/20: EF 50; c.)LHC 01/24/20: EF 45-50; d.)MV 11/29/20: EF 43; e.)TTE 05/18/21: EF 50-55; f.)LHC 05/20/21: EF 50-55; g.)TTE 07/16/21: EF 60-65; h.) TTE 11/16/21: EF 60-65; i.) LHC 11/18/21: EF 55-65; j.) TTE 02/12/22: EF 60-65; k.) TTE 02/18/22: EF 55; l.) TTE 03/19/22: EF 65-70; m.) TTE 05/01/22: EF 55; n.) MV 01/27/23: EF 45-50; o.) TTE 02/12/2023: EF 55-60   Leukocytosis    Long term current use of clopidogrel    Long-term use of aspirin therapy    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   a.) surgical PCR (+) 02/15/2022 prior to CABG   NSTEMI (non-ST elevated myocardial infarction) (HCC) 01/23/2020   a.) troponins were trended: 3520 --> 2990 ng/L; b.) LHC/PCI 01/24/2020: 60% o-pRCA, 40% p-mRCA, 90% mLAD (2.75 x 22 mm Resolute Onyx DES), 40% dLAD   NSTEMI (non-ST elevated myocardial infarction) (HCC) 05/18/2021   a.) troponins were trended: 101 --> 110 --> 175 ng/L; b.) LHC/PCI 05/20/2021: 40% dLAD, 40% p-mRCA, 50%  o-pRCA, 40% RPDA, 30% oLM, 85% mLAD with distal edge dissection (overlapping 2.75 x 34 mm and 2.5 x 8 mm Onyx Frontier DES), 60% D2   OSA on CPAP    Polysubstance abuse (HCC)    a.) cocaine, THC, opiates, tobacco   Postoperative cardiogenic shock (HCC) 02/19/2022   a.) required vasopressor/ionotropic agent support + IABP   Recurrent cellulitis of thigh    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 Consult with MH society determined MH unlikely (see Argwala, MD note 02/22/2022); b.) c/b postop cardiogenic shock req vasopressor/inotrophic support + IABP; c.) c/b dev of BILATERAL pleural effusions (L > R) req LEFT thoracentesis (750 cc yield)   Scrotal lesion (sebaceous cyst)    Tobacco use 11/25/2019    Current Outpatient Medications  Medication Sig Dispense Refill   aspirin EC 81 MG tablet Take 1 tablet (81 mg total) by mouth daily. Swallow whole. 90 tablet 3   clopidogrel (PLAVIX) 75 MG tablet Take 1  tablet (75 mg total) by mouth daily. 90 tablet 3   empagliflozin (JARDIANCE) 10 MG TABS tablet Take 10 mg by mouth daily.     famotidine (PEPCID) 20 MG tablet Take 1 tablet (20 mg total) by mouth at bedtime as needed for heartburn or indigestion. (Patient taking differently: Take 20 mg by mouth at bedtime.) 60 tablet 0   Fluticasone-Umeclidin-Vilant (TRELEGY ELLIPTA) 100-62.5-25 MCG/ACT AEPB Inhale 1 puff into the lungs daily. 28 each 0   furosemide (LASIX) 40 MG tablet Take 1 tablet (40 mg total) by mouth daily. 90 tablet 3   gabapentin (NEURONTIN) 100 MG capsule Take 100 mg by mouth 2 (two) times daily as needed (pain).     HYDROcodone-acetaminophen (NORCO/VICODIN) 5-325 MG tablet Take 2 tablets by mouth every 8 (eight) hours as needed. 14 tablet 0   imiquimod (ALDARA) 5 % cream Apply 1 Application topically daily as needed (irritation).     isosorbide  mononitrate (IMDUR) 30 MG 24 hr tablet Take 1 tablet (30 mg total) by mouth daily. 30 tablet 11   losartan (COZAAR) 50 MG tablet Take 1 tablet (50 mg total) by mouth daily. 90 tablet 3   metoprolol succinate (TOPROL-XL) 25 MG 24 hr tablet Take 2 tablets (50 mg total) by mouth daily. Stop taking metoprolol if you continue to use cocaine 180 tablet 3   nitroGLYCERIN (NITROSTAT) 0.4 MG SL tablet Place 1 tablet (0.4 mg total) under the tongue every 5 (five) minutes as needed for chest pain (if chest pain not resolved after 3 doses, please call 911). 25 tablet PRN   NON FORMULARY Pt uses a bi-pap nightly     nystatin (MYCOSTATIN/NYSTOP) powder Apply 1 Application topically 3 (three) times daily. 15 g 0   ondansetron (ZOFRAN-ODT) 4 MG disintegrating tablet Take 1 tablet (4 mg total) by mouth every 6 (six) hours as needed for nausea or vomiting. 20 tablet 0   pantoprazole (PROTONIX) 40 MG tablet Take 1 tablet by mouth once daily 90 tablet 0   rosuvastatin (CRESTOR) 40 MG tablet Take 1 tablet (40 mg total) by mouth daily. 90 tablet 3   spironolactone (ALDACTONE) 25 MG tablet Take 1 tablet (25 mg total) by mouth daily. 90 tablet 3   triamcinolone (KENALOG) 0.025 % cream APPLY TOPICALLY TWICE DAILY 30 g 0   VENTOLIN HFA 108 (90 Base) MCG/ACT inhaler Inhale 1-2 puffs into the lungs every 6 (six) hours as needed for wheezing or shortness of breath.     No current facility-administered medications for this visit.    Allergies  Allergen Reactions   Bee Pollen     Patient states he is not allergic to bees but has seasonal allergies to pollen      Social History   Socioeconomic History   Marital status: Single    Spouse name: Not on file   Number of children: 3   Years of education: Not on file   Highest education level: Not on file  Occupational History   Not on file  Tobacco Use   Smoking status: Every Day    Current packs/day: 1.00    Average packs/day: 1 pack/day for 10.2 years (10.2 ttl  pk-yrs)    Types: Cigarettes    Start date: 02/12/2013    Passive exposure: Past   Smokeless tobacco: Never   Tobacco comments:    2-3 cigarettes daily- 08/06/2022        1/2 pack daily as of 01/2023  Vaping Use   Vaping status: Never  Used  Substance and Sexual Activity   Alcohol use: Yes    Alcohol/week: 1.0 standard drink of alcohol    Types: 1 Shots of liquor per week    Comment: socially   Drug use: Yes    Comment: states takes gummies occasionally   Sexual activity: Yes  Other Topics Concern   Not on file  Social History Narrative   Lives with his uncle    Social Drivers of Corporate investment banker Strain: Not on file  Food Insecurity: No Food Insecurity (05/01/2022)   Hunger Vital Sign    Worried About Running Out of Food in the Last Year: Never true    Ran Out of Food in the Last Year: Never true  Transportation Needs: No Transportation Needs (05/01/2022)   PRAPARE - Administrator, Civil Service (Medical): No    Lack of Transportation (Non-Medical): No  Physical Activity: Not on file  Stress: Not on file  Social Connections: Unknown (08/05/2022)   Received from [facility], [facility]   Social Network    Social Network: Not on file  Intimate Partner Violence: Unknown (08/05/2022)   Received from [facility], [facility]   HITS    Physically Hurt: Not on file    Insult or Talk Down To: Not on file    Threaten Physical Harm: Not on file    Scream or Curse: Not on file      Family History  Problem Relation Age of Onset   Coronary artery disease Mother    Arrhythmia Mother        Pacemaker placed in 2021   Heart failure Mother    Breast cancer Mother    Coronary artery disease Father    Colon cancer Father    Colon polyps Father     Vitals:   04/10/23 1459  BP: 118/64  Pulse: 78  SpO2: 97%  Weight: 240 lb (108.9 kg)     Wt Readings from Last 3 Encounters:  04/10/23 240 lb (108.9 kg)  04/03/23 238 lb (108 kg)  03/20/23 239  lb 12.8 oz (108.8 kg)     PHYSICAL EXAM: General:  Well appearing. No resp difficulty HEENT: normal Neck: supple. no JVD. Carotids 2+ bilat; no bruits. No lymphadenopathy or thryomegaly appreciated. Cor: PMI nondisplaced. Regular rate & rhythm. No rubs, gallops or murmurs. Lungs: decreased throughout Abdomen: obese soft, nontender, nondistended. No hepatosplenomegaly. No bruits or masses. Good bowel sounds. Extremities: no cyanosis, clubbing, rash, edema Neuro: alert & orientedx3, cranial nerves grossly intact. moves all 4 extremities w/o difficulty. Affect pleasant    ASSESSMENT & PLAN:  1. Chronic diastolic HF - S/p CABG 02/19/22 - Echo 12/29 with EF 50-55%, mild AI, mild MR.  - POCUS echo 03/07/22 in clinic LVEF 60% RV ok. No pericardial effusion - Echo 05/01/22 EF 55% - Echo 12/24 EF 55-60% - Stable NYHA II - Volume status looks good - Continue Toprol 50 mg daily - Continue losartan 50 mg daily  - Continue spironolactone 25 mg daily.  - Continue Jardiance10 mg daily.     2. CAD with NSTEMI - Hx prior PCI/stent to LAD in 2021 and PCI/stent to LAD in 03/23 d/t in-stent restenosis - NSTEMI 12/23 S/p CABG X 3 on 02/19/22 (LIMA to Diag, SVG to LAD, SVG to RCA) - Cath 12/24 for+ stress test. Severe 2v CAD occluded SVG-LAD but LAD back fills some from LIMA to D2 - No s/s angina. Continue DAPT - Stressed need  for smoking cessation   3. CKD 3a -Baseline Scr 1.3-1.6 - Continue ARB/SGLT2i - Scr 03/17/23 1.4 - reviewed personally.   4. Polysubstance abuse -Tobacco and cocaine - Reports complete abstinence from cocaine - Still smoking ~ 1/4 ppd -As above, discussed need for cessation   5. OSA, severe - Split study  showed severe  OSA with AHI 52/ hr  - Seeing Dr. Vassie Loll. On Bipap - Encouraged compliance  6. COPD - per Pulmonary.  - PFTs with mild obstruction   7. Obesity - Body mass index is 32.55 kg/m. - Consider [id number]  Arvilla Meres, MD 04/10/23

## 2023-04-13 ENCOUNTER — Other Ambulatory Visit: Payer: Self-pay | Admitting: Nurse Practitioner

## 2023-04-13 DIAGNOSIS — R21 Rash and other nonspecific skin eruption: Secondary | ICD-10-CM

## 2023-04-14 NOTE — Telephone Encounter (Signed)
 Please advise [facility]

## 2023-04-22 NOTE — Progress Notes (Unsigned)
 04/23/2023 8:52 AM   Logan Crawford 10/04/1975 [id number]  Referring provider: Donell Beers, FNP [phone] S. [address], [address] [place],  [place] [address]  Urological history: 1. Scrotal sebaceous cysts -excised (03/06/2023)   No chief complaint on file.  HPI: Logan Crawford is a 48 y.o. male who presents today for wound recheck.   Previous records reviewed.   Mr. Hynes underwent an excision of 2 scrotal sebaceous cyst with Dr. Richardo Hanks on March 05, 2022 and developed a post op wound dehiscence.  He has been packing the wound daily.    PMH: Past Medical History:  Diagnosis Date   (HFpEF) heart failure with preserved ejection fraction (HCC)    a.) TTE 11/26/19: EF 50-55, G1DD; b.) TTE 01/24/20: EF 50, LVH, mid-apical antsep HK, G1DD; c.) TTE 05/18/21: EF 50-55, antsep/sep/apical HK, LVH, mild MR, AoV calc; d.) TTE 07/16/21: EF 60-65, LVH, G2DD, mild MD; e.) TTE 02/18/22: EF 55, sep/inf HK, LVH, G1DD, mild MR; f.) TTE 03/19/22: EF 65-70, antsep/periapical HK, LVH, G1DD, mild MR, AoV calc; g.) TTE 02/12/2023: EF 55-60, LVH, G2DD, mild MR   Anginal pain (HCC)    Aortic atherosclerosis (HCC)    Bilateral pleural effusion 02/20/2022   a.) s/p CABG and LAA closure (L > R) --> progressive during admission --> ultimately required LEFT thoracentesis on 03/10/2022 (750 cc serous yield)   CAD (coronary artery disease)    a.)PCI 01/24/20: 90 mLAD (2.75x22 Res Onyx); b.)PCI 05/20/21: 85 mLAD (2.75x24 + 2.5x8 Onyx); c.)LHC 11/18/21: 80 ISR + 60 mLAD - CVTS vs stent POBA; d.)LHC 02/13/22: 70 ISR p-mLAD, 80/50 ISR mLAD, 99 ISR dLAD, 80 jailed D2, diff HG RCA Dz; e.)3v CABG 02/19/22; f.)LHC 02/03/23: 30 LM, 70 p-mLAD, 80/70 mLAD, 99 dLAD, 80 D2, 70/70 pRCA, 70/95 mRCA, 40 dRCA, 60 RV branch, 60 RPDA, 100 SVG-LAD - Rx mgmt   CKD (chronic kidney disease), stage III (HCC)    Complete heart block (HCC) 02/19/2022   a.) during CABG/LAA closure --> hyperkalemic CHB in OR before heart surgery  had functionally begun (not on bypass yet); etiology unknown; ?? due to medication administration or contaminant given fairly normal preop renal function (Cr 1.3). Persisted post-bypass event with extensive dialysis equivalent via CPB pump --> Tx'd with dantrolene   Complication of anesthesia 02/19/2022   a.) perioperative HYPERkalemia - concern for atypical MH initially - Tx'd intraoperaitvely with dantrolene. Preop K+ 4.6 and CR 1.3 --> K+ peaked at 7.8 intra/postop --> rec'd furosemide + insulin + calcium + extended CBP (remained on pump at least 1 hour after procedure). Consult with MH society determined "unlikely" MH (see Argwala, MD note 02/22/2022). No confirmatory CHCT testing.   COPD (chronic obstructive pulmonary disease) (HCC)    Genital warts    GERD (gastroesophageal reflux disease)    Hiatal hernia    History of 2019 novel coronavirus disease (COVID-19) 11/25/2019   History of left atrial appendage closure 02/19/2022   a.) performed simultaneously with cardiac revascularization (CABG) procedure on 02/19/2022   Hyperlipidemia    Hypertension    Ischemic cardiomyopathy    a.) TTE 11/26/19: EF 50-55; b.)TTE 01/24/20: EF 50; c.)LHC 01/24/20: EF 45-50; d.)MV 11/29/20: EF 43; e.)TTE 05/18/21: EF 50-55; f.)LHC 05/20/21: EF 50-55; g.)TTE 07/16/21: EF 60-65; h.) TTE 11/16/21: EF 60-65; i.) LHC 11/18/21: EF 55-65; j.) TTE 02/12/22: EF 60-65; k.) TTE 02/18/22: EF 55; l.) TTE 03/19/22: EF 65-70; m.) TTE 05/01/22: EF 55; n.) MV 01/27/23: EF 45-50; o.) TTE 02/12/2023:  EF 55-60   Leukocytosis    Long term current use of clopidogrel    Long-term use of aspirin therapy    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   a.) surgical PCR (+) 02/15/2022 prior to CABG   NSTEMI (non-ST elevated myocardial infarction) (HCC) 01/23/2020   a.) troponins were trended: 3520 --> 2990 ng/L; b.) LHC/PCI 01/24/2020: 60% o-pRCA, 40% p-mRCA, 90% mLAD (2.75 x 22 mm Resolute Onyx DES), 40% dLAD   NSTEMI (non-ST elevated  myocardial infarction) (HCC) 05/18/2021   a.) troponins were trended: 101 --> 110 --> 175 ng/L; b.) LHC/PCI 05/20/2021: 40% dLAD, 40% p-mRCA, 50% o-pRCA, 40% RPDA, 30% oLM, 85% mLAD with distal edge dissection (overlapping 2.75 x 34 mm and 2.5 x 8 mm Onyx Frontier DES), 60% D2   OSA on CPAP    Polysubstance abuse (HCC)    a.) cocaine, THC, opiates, tobacco   Postoperative cardiogenic shock (HCC) 02/19/2022   a.) required vasopressor/ionotropic agent support + IABP   Recurrent cellulitis of thigh    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 Consult with MH society determined MH unlikely (see Argwala, MD note 02/22/2022); b.) c/b postop cardiogenic shock req vasopressor/inotrophic support + IABP; c.) c/b dev of BILATERAL pleural effusions (L > R) req LEFT thoracentesis (750 cc yield)   Scrotal lesion (sebaceous cyst)    Tobacco use 11/25/2019    Surgical History: Past Surgical History:  Procedure Laterality Date   CORONARY ARTERY BYPASS GRAFT N/A 02/19/2022   Procedure: CORONARY ARTERY BYPASS GRAFTING (CABG) X THREE, USING ENDOSCOPICALLY HARVESTED RIGHT GREATER SAPHENOUS VEIN  AND LEFT ATRIAL APPENDAGE CLIPPING;  Surgeon: Lyn Hollingshead, MD;  Location: [facility];  Service: Open Heart Surgery;  Laterality: N/A;   CORONARY STENT INTERVENTION N/A 01/24/2020   Procedure: CORONARY STENT INTERVENTION;  Surgeon: Iran Ouch, MD;  Location: [facility];  Service: Cardiovascular;  Laterality: N/A;   CORONARY STENT INTERVENTION N/A 05/20/2021   Procedure: CORONARY STENT INTERVENTION;  Surgeon: Iran Ouch, MD;  Location: [facility];  Service: Cardiovascular;  Laterality: N/A;   CORONARY ULTRASOUND/IVUS N/A 05/20/2021   Procedure: Intravascular Ultrasound/IVUS;  Surgeon: Iran Ouch, MD;  Location: [facility];  Service:  Cardiovascular;  Laterality: N/A;   IRRIGATION AND DEBRIDEMENT KNEE Left    LEFT ATRIAL APPENDAGE OCCLUSION Left 02/19/2022   Procedure: LEFT ATRIAL APPENDAGE OCCLUSION; Location: [facility]; Surgeon: Clare Charon, MD   LEFT HEART CATH AND CORONARY ANGIOGRAPHY N/A 01/24/2020   Procedure: LEFT HEART CATH AND CORONARY ANGIOGRAPHY;  Surgeon: Iran Ouch, MD;  Location: [facility];  Service: Cardiovascular;  Laterality: N/A;   LEFT HEART CATH AND CORONARY ANGIOGRAPHY N/A 11/18/2021   Procedure: LEFT HEART CATH AND CORONARY ANGIOGRAPHY;  Surgeon: Iran Ouch, MD;  Location: [facility];  Service: Cardiovascular;  Laterality: N/A;   LEFT HEART CATH AND CORONARY ANGIOGRAPHY N/A 02/13/2022   Procedure: LEFT HEART CATH AND CORONARY ANGIOGRAPHY;  Surgeon: Marykay Lex, MD;  Location: [facility];  Service: Cardiovascular;  Laterality: N/A;   LEFT HEART CATH AND CORS/GRAFTS ANGIOGRAPHY N/A 05/20/2021   Procedure: LEFT HEART CATH AND CORS/GRAFTS ANGIOGRAPHY;  Surgeon: Iran Ouch, MD;  Location: [facility];  Service: Cardiovascular;  Laterality: N/A;   LEFT HEART CATH AND CORS/GRAFTS ANGIOGRAPHY N/A 02/03/2023   Procedure: LEFT  HEART CATH AND CORS/GRAFTS ANGIOGRAPHY;  Surgeon: Yvonne Kendall, MD;  Location: [facility];  Service: Cardiovascular;  Laterality: N/A;   SCROTAL EXPLORATION N/A 03/06/2023   Procedure: EXCISION OF SCROTUM LESION, SEBACEOUS CYST;  Surgeon: Sondra Come, MD;  Location: [facility];  Service: Urology;  Laterality: N/A;   TYMPANOSTOMY TUBE PLACEMENT Bilateral     Home Medications:  Allergies as of 04/23/2023       Reactions   Bee Pollen    Patient states he is not allergic to bees but has seasonal allergies to pollen        Medication List        Accurate as of April 22, 2023  8:52 AM. If you have any questions, ask your nurse or doctor.          aspirin EC 81 MG tablet Take 1 tablet (81 mg  total) by mouth daily. Swallow whole.   clopidogrel 75 MG tablet Commonly known as: PLAVIX Take 1 tablet (75 mg total) by mouth daily.   famotidine 20 MG tablet Commonly known as: PEPCID Take 1 tablet (20 mg total) by mouth at bedtime as needed for heartburn or indigestion. What changed: when to take this   furosemide 40 MG tablet Commonly known as: LASIX Take 1 tablet (40 mg total) by mouth daily.   gabapentin 100 MG capsule Commonly known as: NEURONTIN Take 100 mg by mouth 2 (two) times daily as needed (pain).   HYDROcodone-acetaminophen 5-325 MG tablet Commonly known as: NORCO/VICODIN Take 2 tablets by mouth every 8 (eight) hours as needed.   imiquimod 5 % cream Commonly known as: ALDARA Apply 1 Application topically daily as needed (irritation).   isosorbide mononitrate 30 MG 24 hr tablet Commonly known as: IMDUR Take 1 tablet (30 mg total) by mouth daily.   Jardiance 10 MG Tabs tablet Generic drug: empagliflozin Take 10 mg by mouth daily.   losartan 50 MG tablet Commonly known as: COZAAR Take 1 tablet (50 mg total) by mouth daily.   metoprolol succinate 25 MG 24 hr tablet Commonly known as: TOPROL-XL Take 2 tablets (50 mg total) by mouth daily. Stop taking metoprolol if you continue to use cocaine   nitroGLYCERIN 0.4 MG SL tablet Commonly known as: NITROSTAT Place 1 tablet (0.4 mg total) under the tongue every 5 (five) minutes as needed for chest pain (if chest pain not resolved after 3 doses, please call 911).   NON FORMULARY Pt uses a bi-pap nightly   nystatin powder Commonly known as: MYCOSTATIN/NYSTOP Apply 1 Application topically 3 (three) times daily.   ondansetron 4 MG disintegrating tablet Commonly known as: ZOFRAN-ODT Take 1 tablet (4 mg total) by mouth every 6 (six) hours as needed for nausea or vomiting.   pantoprazole 40 MG tablet Commonly known as: PROTONIX Take 1 tablet by mouth once daily   rosuvastatin 40 MG tablet Commonly known as:  CRESTOR Take 1 tablet (40 mg total) by mouth daily.   spironolactone 25 MG tablet Commonly known as: ALDACTONE Take 1 tablet (25 mg total) by mouth daily.   Trelegy Ellipta 100-62.5-25 MCG/ACT Aepb Generic drug: Fluticasone-Umeclidin-Vilant Inhale 1 puff into the lungs daily.   triamcinolone 0.025 % cream Commonly known as: KENALOG APPLY TOPICALLY TWICE DAILY   Ventolin HFA 108 (90 Base) MCG/ACT inhaler Generic drug: albuterol Inhale 1-2 puffs into the lungs every 6 (six) hours as needed for wheezing or shortness of breath.        Allergies:  Allergies  Allergen Reactions   Bee Pollen     Patient states he is not allergic to bees but has seasonal allergies to pollen    Family History: Family History  Problem Relation Age of Onset   Coronary artery disease Mother    Arrhythmia Mother        Pacemaker placed in 2021   Heart failure Mother    Breast cancer Mother    Coronary artery disease Father    Colon cancer Father    Colon polyps Father     Social History:  reports that he has been smoking cigarettes. He started smoking about 10 years ago. He has a 10.2 pack-year smoking history. He has been exposed to tobacco smoke. He has never used smokeless tobacco. He reports current alcohol use of about 1.0 standard drink of alcohol per week. He reports current drug use.  ROS: Pertinent ROS in HPI  Physical Exam: There were no vitals taken for this visit.  Constitutional:  Well nourished. Alert and oriented, No acute distress. HEENT: [place] AT, moist mucus membranes.  Trachea midline, no masses. Cardiovascular: No clubbing, cyanosis, or edema. Respiratory: Normal respiratory effort, no increased work of breathing. GI: Abdomen is soft, non tender, non distended, no abdominal masses. Liver and spleen not palpable.  No hernias appreciated.  Stool sample for occult testing is not indicated.   GU: No CVA tenderness.  No bladder fullness or masses.  Patient with  circumcised/uncircumcised phallus. ***Foreskin easily retracted***  Urethral meatus is patent.  No penile discharge. No penile lesions or rashes. Scrotum without lesions, cysts, rashes and/or edema.  Testicles are located scrotally bilaterally. No masses are appreciated in the testicles. Left and right epididymis are normal. Rectal: Patient with  normal sphincter tone. Anus and perineum without scarring or rashes. No rectal masses are appreciated. Prostate is approximately *** grams, *** nodules are appreciated. Seminal vesicles are normal. Skin: No rashes, bruises or suspicious lesions. Lymph: No cervical or inguinal adenopathy. Neurologic: Grossly intact, no focal deficits, moving all 4 extremities. Psychiatric: Normal mood and affect.  Laboratory Data: Lab Results  Component Value Date   WBC 10.6 (H) 03/17/2023   HGB 16.7 03/17/2023   HCT 51.3 03/17/2023   MCV 80.4 03/17/2023   PLT 436 (H) 03/17/2023    Lab Results  Component Value Date   CREATININE 1.46 (H) 03/17/2023    Lab Results  Component Value Date   HGBA1C 5.2 04/14/2022       Component Value Date/Time   CHOL 138 05/02/2022 0625   HDL 26 (L) 05/02/2022 0625   CHOLHDL 5.3 05/02/2022 0625   VLDL 66 (H) 05/02/2022 0625   LDLCALC 46 05/02/2022 0625    Lab Results  Component Value Date   AST 24 06/05/2022   Lab Results  Component Value Date   ALT 16 06/05/2022  I have reviewed the labs.   Pertinent Imaging: *** I have independently reviewed the films.    Assessment & Plan:  ***  1.  Sebaceous cyst of the scrotum -***    No follow-ups on file.  These notes generated with voice recognition software. I apologize for typographical errors.  Cloretta Ned  [facility] [address]  [address] [place], [place] [address] [phone]

## 2023-04-23 ENCOUNTER — Encounter: Payer: Self-pay | Admitting: Urology

## 2023-04-23 ENCOUNTER — Ambulatory Visit: Payer: Self-pay | Admitting: Urology

## 2023-04-23 VITALS — BP 94/61 | HR 89 | Ht 72.0 in | Wt 237.0 lb

## 2023-04-23 DIAGNOSIS — R351 Nocturia: Secondary | ICD-10-CM

## 2023-04-23 DIAGNOSIS — L723 Sebaceous cyst: Secondary | ICD-10-CM

## 2023-04-23 MED ORDER — SILODOSIN 4 MG PO CAPS: 4.0000 mg | ORAL_CAPSULE | Freq: Every day | ORAL | 3 refills | Status: DC

## 2023-05-01 ENCOUNTER — Telehealth: Payer: Self-pay | Admitting: Urology

## 2023-05-01 NOTE — Telephone Encounter (Signed)
 His insurance denied the silodosin.   It can be purchased for ~ $30 out of pocket with either GoodRx or Single care coupon.   His insurance may cover the medication for BPH, but he would have to come in for a DRE so we can document BPH for the insurance.

## 2023-05-13 NOTE — Progress Notes (Unsigned)
 Cardiology Clinic Note   Date: 05/14/2023 ID: Logan, Crawford 06/06/1975, MRN [id number]  Primary Cardiologist:  Debbe Odea, MD  Chief Complaint   Logan Crawford is a 48 y.o. male who presents to the clinic today for routine follow up.   Patient Profile   Logan Crawford is followed by Dr. Azucena Cecil for the history outlined below.      Past medical history significant for: CAD. LHC 01/24/2020 (NSTEMI): Ostial to proximal RCA 60%.  Proximal to mid RCA 40%.  Mid LAD 90%.  PCI with DES 2.75 x 22 mm to mid LAD.  Mildly reduced LV systolic function with EF 45% with moderate to severe anteroapical hypokinesis.  Moderately elevated LVEDP. LHC 05/20/2021 (NSTEMI): Distal LAD 40%.  Proximal to mid RCA 40%.  Ostial proximal RCA 50%.  RPDA 40%.  Ostial LM 30%.  Mid LAD 85% distal edge in-stent restenosis with extensive plaque distal to the stent involving the whole mid segment.  D2 60%.  PCI with DES 2.75 x 34 mm to mid LAD.  Balloon angioplasty to ostial D2. LHC 11/18/2021 (unstable angina): Ostial 30%.  Distal LAD 40%.  Ostial to proximal RCA 50%.  Proximal mid RCA 40%.  D2 30%.  RPDA 40%.  Mid LAD #1 80%, #3 60%, mid RCA 70%.  Patent overlapping LAD stents with evidence of significant in-stent restenosis in the proximal segment with borderline stenosis distal to the stent.  Recommended CABG versus balloon angioplasty given patient's poor compliance with medical therapy and continued cocaine use. LHC 02/13/2022: Proximal and distal LAD stent: Proximal to mid LAD 70%, jailed D2 80%.  Mid LAD #1 80%, #2 50%, distal LAD had distal stent edge 99%.  Mid RCA #1 70%, #2 95%.  RVEF 60%.  RPDA 60%.  Recommend transfer to [facility] for CTS consult. CABG x 3 02/19/2022: LIMA to diagonal, SVG to LAD, SVG to RCA.  LAA closure. Nuclear stress test 01/27/2023: Abnormal myocardial perfusion test, intermediate risk study.  Moderate in size, moderate severity reversible defect involving the apex  concerning for ischemia but cannot exclude an element of artifact. LHC 02/03/2023 (abnormal stress test): Severe two-vessel native CAD with up to 99% in-stent restenosis of the LAD and multifocal RCA disease of up to 95%.  Overall.  Similar to pre-CABG native CAD.  Widely patent LIMA to D2 and SVG to RCA.  Occluded SVG to LAD.  Recommend addition of isosorbide.  If refractory symptoms consider PTCA/PCI of LAD in-stent restenosis. Chronic diastolic heart failure. Echo 02/12/2023: EF 55 to 60%.  No RWMA.  Mild LVH.  Grade II DD.  Normal RV size/function.  Mild MR.   Hypertension. Hyperlipidemia. Lipid panel 05/14/2022: LDL 46, HDL 26, TG 331, total 138. GERD. Emphysema. OSA.  CKD stage IIIa. Tobacco abuse. Polysubstance abuse. Medication nonadherence.  In summary, patient has an extensive cardiac history with multiple PCI and CABG as detailed.  He was admitted to the hospital in October 2021 with progressive weakness, fatigue, dyspnea, cough, diarrhea.  He was found to be COVID-positive.  Troponin peaked at 32.  Echo showed EF 50 to 55%, no RWMA, Grade I DD, normal RV size and function, no valvular abnormalities.  He was readmitted November 2021 with NSTEMI, troponin peaked at 3520.  Echo at that time showed EF 50%, moderate hypokinesis of the mid apical anteroseptal wall, mild LVH, diastolic dysfunction, normal RV size/function, mild aortic valve sclerosis without stenosis.  He underwent successful PCI with DES to mid LAD.  Lexiscan October 2022 showed a small region of ischemia in the apical wall MI is overall low risk.    He was admitted to the hospital March 2023 with chest pain, troponin peaked at 125.  UDS positive for cocaine.  Echo showed normal LV/RV function, hypokinesis of the mid to distal anteroseptal/septal and apical region, moderate LVH, Grade I DD, mild MR, aortic valve sclerosis without stenosis.  LHC demonstrated in-stent restenosis as detailed above patient underwent PCI with DES  and balloon angioplasty.    He was admitted September 2023 with unstable angina.  He had not been taking his medications.  UDS positive for cocaine.  Echo showed EF 60 to 65%, no RWMA, moderate concentric LVH, Grade I DD, normal RV size/function, trivial MR, aortic valve sclerosis without stenosis.  LHC showed significant in-stent restenosis in the proximal segment of overlapping LAD stents.  IC NTG improved overall appearance of coronary arteries and intervention was deferred secondary to patient's poor compliance and continued cocaine use.  Antianginal therapy was optimized with plan for revascularization if patient remains symptomatic.  He was admitted to the hospital December 2023 with NSTEMI in the setting of continued cocaine use and nonadherence with antiplatelet therapy.  Echo showed normal LV/RV function, no RWMA, mild MR.  LHC showed 2 potential culprit lesion including mid to distal RCA and mid to distal LAD.  He was transferred to [facility] and underwent CABG x 3.  Intraoperative course complicated by hyperkalemia bradycardic arrest prior to initiation of cardiopulmonary bypass with requirement of IABP as well as inotropic and vasopressor support which was continued postoperatively.  Posthospitalization course complicated by left pleural effusion requiring thoracentesis.    Patient was seen for preop evaluation in November 2024.  He reported occasional chest pain and more dyspnea with minimal exertion over a couple of months.  Patient reported inadvertently not taking metoprolol and losartan x 2 months he restarted losartan but wanted to be sure he should take metoprolol.  He reported using cocaine the month prior.  EKG demonstrated more prominent inverted T waves in lateral leads concerning for possible ischemia.  He had an abnormal, intermediate risk nuclear stress test as detailed above.  He then underwent LHC which demonstrated severe two-vessel native CAD and occluded SVG to LAD as detailed  above.  He was started on isosorbide.  Upon follow-up in December 2024 he reported improvement in symptoms since starting isosorbide.  He was cleared for his surgery which was performed on 03/06/2023.  Patient was evaluated by Dr. Gala Romney on 04/10/2023 for routine follow-up.  His volume status was normal and no medication changes were made.  He was encouraged to stop smoking and be compliant with BiPAP.     History of Present Illness    Today, patient is doing well. Patient denies shortness of breath, dyspnea on exertion, lower extremity edema, orthopnea or PND. No further chest pain, pressure, or tightness. No palpitations. He has been going to the gym to walk on the treadmill and lift weights at least 2 times a week and sometimes up to 4 times a week. He has been using his BiPAP regularly and noted a big difference in his energy. He smokes 1 pack of cigarettes every 4-5 days and continues to work on cutting back until he quits. He has not used cocaine in several months.     ROS: All other systems reviewed and are otherwise negative except as noted in History of Present Illness.  EKGs/Labs Reviewed  06/05/2022: ALT 16; AST 24 03/17/2023: BUN 9; Creatinine, Ser 1.46; Potassium 3.4; Sodium 134   03/17/2023: Hemoglobin 16.7; WBC 10.6   06/20/2022: B Natriuretic Peptide 32.8    Physical Exam    VS:  BP 124/88   Pulse 75   Ht 6' (1.829 m)   Wt 239 lb 6.4 oz (108.6 kg)   SpO2 94%   BMI 32.47 kg/m  , BMI Body mass index is 32.47 kg/m.  GEN: Well nourished, well developed, in no acute distress. Neck: No JVD or carotid bruits. Cardiac:  RRR. No murmurs. No rubs or gallops.   Respiratory:  Respirations regular and unlabored. Clear to auscultation without rales, wheezing or rhonchi. GI: Soft, nontender, nondistended. Extremities: Radials/DP/PT 2+ and equal bilaterally. No clubbing or cyanosis. No edema.  Skin: Warm and dry, no rash. Neuro: Strength intact.  Assessment & Plan    CAD Extensive history with multiple PCI. CABG x 26 January 2022. Patient presented for pre-op evaluation and EKG showed more prominent T-Wave inversion in lateral leads when compared to prior EKG. He complained of more dyspnea with activity Lexiscan showed evidence of ischemia. He underwent LHC which showed severe two-vessel native CAD with up to 99% in-stent restenosis of the LAD and multifocal RCA disease up to 95%.  Patent LIMA to D2 and SVG to RCA, occluded SVG to LAD.  Patient was started on isosorbide.  Recommend consideration of PTCA/PCI to in-stent restenosis of LAD if refractory symptoms. Patient reports no further chest pain, pressure or tightness. He has been working out at the gym 2-4 times a week with good tolerance.  -Continue to advance physical activity as tolerated. -Continue aspirin, Plavix, rosuvastatin, Toprol, isosorbide, as needed SL NTG.   Chronic diastolic heart failure Echo December 2024 showed normal LV/RV function, mild LVH, Grade II DD, mild MR.  Patient reports he had a little bit of dyspnea one month ago in the setting of URI. None since.  Euvolemic and well compensated on exam. -Continue Jardiance, furosemide, spironolactone, isosorbide, Toprol and losartan.   Hypertension BP today 124/88. No dizziness or headaches reported.   -Continue losartan, Toprol, spironolactone.   Hyperlipidemia LDL March 2024 46, at goal. -Continue rosuvastatin.  Polysubstance abuse Patient smokes 1 pack of cigarettes every 4-5 days. He has not used cocaine in several months.  -Encouraged continued abstinence of illicit drugs.  -Congratulated on cutting back on cigarettes. Encouraged complete cessation.   OSA Patient reports he started using his BiPAP machine regularly and has noticed a big difference in his energy.  -Continue adherence with BiPAP.   Disposition: Return in 6 months or sooner as needed.          Signed, Etta Grandchild. Zachory Mangual, DNP, NP-C

## 2023-05-14 ENCOUNTER — Ambulatory Visit: Payer: Medicaid Other | Attending: Student | Admitting: Student

## 2023-05-14 ENCOUNTER — Encounter: Payer: Self-pay | Admitting: Student

## 2023-05-14 ENCOUNTER — Telehealth: Payer: Self-pay | Admitting: Urology

## 2023-05-14 VITALS — BP 124/88 | HR 75 | Ht 72.0 in | Wt 239.4 lb

## 2023-05-14 DIAGNOSIS — E785 Hyperlipidemia, unspecified: Secondary | ICD-10-CM | POA: Diagnosis not present

## 2023-05-14 DIAGNOSIS — I5032 Chronic diastolic (congestive) heart failure: Secondary | ICD-10-CM | POA: Diagnosis not present

## 2023-05-14 DIAGNOSIS — I10 Essential (primary) hypertension: Secondary | ICD-10-CM | POA: Diagnosis not present

## 2023-05-14 DIAGNOSIS — F1910 Other psychoactive substance abuse, uncomplicated: Secondary | ICD-10-CM

## 2023-05-14 DIAGNOSIS — I2510 Atherosclerotic heart disease of native coronary artery without angina pectoris: Secondary | ICD-10-CM | POA: Diagnosis not present

## 2023-05-14 DIAGNOSIS — G4733 Obstructive sleep apnea (adult) (pediatric): Secondary | ICD-10-CM

## 2023-05-14 NOTE — Telephone Encounter (Signed)
 Patient stopped in stating that he is not taking the RX Silodosin that was prescribed to him due to it not being covered by his insurance and costing him over $200. Patient would to know if there is another RX he can take and if it can be sent to his pharmacy. Please advise patient.

## 2023-05-14 NOTE — Telephone Encounter (Signed)
 His insurance denied the silodosin.   It can be purchased for ~ $30 out of pocket with either GoodRx or Single care coupon.   His insurance may cover the medication for BPH, but he would have to come in for a DRE so we can document BPH for the insurance.      Per CM note on 3/7.   N/a. VM not set up.

## 2023-05-14 NOTE — Patient Instructions (Signed)
 Medication Instructions:  No changes *If you need a refill on your cardiac medications before your next appointment, please call your pharmacy*   Lab Work: None ordered If you have labs (blood work) drawn today and your tests are completely normal, you will receive your results only by: MyChart Message (if you have MyChart) OR A paper copy in the mail If you have any lab test that is abnormal or we need to change your treatment, we will call you to review the results.   Testing/Procedures: None ordered   Follow-Up: At [facility], you and your health needs are our priority.  As part of our continuing mission to provide you with exceptional heart care, we have created designated Provider Care Teams.  These Care Teams include your primary Cardiologist (physician) and Advanced Practice Providers (APPs -  Physician Assistants and Nurse Practitioners) who all work together to provide you with the care you need, when you need it.  We recommend signing up for the patient portal called "MyChart".  Sign up information is provided on this After Visit Summary.  MyChart is used to connect with patients for Virtual Visits (Telemedicine).  Patients are able to view lab/test results, encounter notes, upcoming appointments, etc.  Non-urgent messages can be sent to your provider as well.   To learn more about what you can do with MyChart, go to [email or web address].    Your next appointment:   6 month(s)  Provider:   You may see Logan Odea, MD or one of the following Advanced Practice Providers on your designated Care Team:   Logan Levering, NP    Other Instructions Please call your urologist to ask about Flomax.

## 2023-05-15 NOTE — Telephone Encounter (Signed)
 Pt will try good rx.   Will f/u with SM on 4/1 appt.

## 2023-05-25 NOTE — Progress Notes (Signed)
Sent to [employer]. KH

## 2023-05-25 NOTE — Progress Notes (Unsigned)
 05/26/2023 8:06 PM   Logan Crawford 10/07/1975 [id number]  Referring provider: Donell Beers, FNP [phone] [address] [place],  [place] [address]  Urological history: 1. Scrotal sebaceous cysts -excised (03/06/2023)   No chief complaint on file.  HPI: Logan Crawford is a 48 y.o. male who presents today for wound recheck.   Previous records reviewed.   At his visit on 04/23/2023, Logan Crawford underwent an excision of 2 scrotal sebaceous cyst with Dr. Richardo Hanks on March 05, 2022 and developed a post op wound dehiscence.  He has been packing the wound daily.  His incision that is located on the ventral side of his penis is almost completely closed.  He did have a new complaint today of nocturia x 3-4.  He states he wakes up in the middle of the night feeling like his bladder is very full and then he has to make several trips to the bathroom.  He does not have this issue as much during the day.  Patient denies any modifying or aggravating factors.  Patient denies any recent UTI's, gross hematuria, dysuria or suprapubic/flank pain.  Patient denies any fevers, chills, nausea or vomiting.   In regards to the nocturia, he was started on Rapaflo 4 mg at bedtime.    I PSS ***  PVR ***    Score:  1-7 Mild 8-19 Moderate 20-35 Severe   PMH: Past Medical History:  Diagnosis Date   (HFpEF) heart failure with preserved ejection fraction (HCC)    a.) TTE 11/26/19: EF 50-55, G1DD; b.) TTE 01/24/20: EF 50, LVH, mid-apical antsep HK, G1DD; c.) TTE 05/18/21: EF 50-55, antsep/sep/apical HK, LVH, mild MR, AoV calc; d.) TTE 07/16/21: EF 60-65, LVH, G2DD, mild MD; e.) TTE 02/18/22: EF 55, sep/inf HK, LVH, G1DD, mild MR; f.) TTE 03/19/22: EF 65-70, antsep/periapical HK, LVH, G1DD, mild MR, AoV calc; g.) TTE 02/12/2023: EF 55-60, LVH, G2DD, mild MR   Anginal pain (HCC)    Aortic atherosclerosis (HCC)    Bilateral pleural effusion 02/20/2022   a.) s/p CABG and LAA closure (L > R) -->  progressive during admission --> ultimately required LEFT thoracentesis on 03/10/2022 (750 cc serous yield)   CAD (coronary artery disease)    a.)PCI 01/24/20: 90 mLAD (2.75x22 Res Onyx); b.)PCI 05/20/21: 85 mLAD (2.75x24 + 2.5x8 Onyx); c.)LHC 11/18/21: 80 ISR + 60 mLAD - CVTS vs stent POBA; d.)LHC 02/13/22: 70 ISR p-mLAD, 80/50 ISR mLAD, 99 ISR dLAD, 80 jailed D2, diff HG RCA Dz; e.)3v CABG 02/19/22; f.)LHC 02/03/23: 30 LM, 70 p-mLAD, 80/70 mLAD, 99 dLAD, 80 D2, 70/70 pRCA, 70/95 mRCA, 40 dRCA, 60 RV branch, 60 RPDA, 100 SVG-LAD - Rx mgmt   CKD (chronic kidney disease), stage III (HCC)    Complete heart block (HCC) 02/19/2022   a.) during CABG/LAA closure --> hyperkalemic CHB in OR before heart surgery had functionally begun (not on bypass yet); etiology unknown; ?? due to medication administration or contaminant given fairly normal preop renal function (Cr 1.3). Persisted post-bypass event with extensive dialysis equivalent via CPB pump --> Tx'd with dantrolene   Complication of anesthesia 02/19/2022   a.) perioperative HYPERkalemia - concern for atypical MH initially - Tx'd intraoperaitvely with dantrolene. Preop K+ 4.6 and CR 1.3 --> K+ peaked at 7.8 intra/postop --> rec'd furosemide + insulin + calcium + extended CBP (remained on pump at least 1 hour after procedure). Consult with MH society determined "unlikely" MH (see Argwala, MD note 02/22/2022). No confirmatory CHCT  testing.   COPD (chronic obstructive pulmonary disease) (HCC)    Genital warts    GERD (gastroesophageal reflux disease)    Hiatal hernia    History of 2019 novel coronavirus disease (COVID-19) 11/25/2019   History of left atrial appendage closure 02/19/2022   a.) performed simultaneously with cardiac revascularization (CABG) procedure on 02/19/2022   Hyperlipidemia    Hypertension    Ischemic cardiomyopathy    a.) TTE 11/26/19: EF 50-55; b.)TTE 01/24/20: EF 50; c.)LHC 01/24/20: EF 45-50; d.)MV 11/29/20: EF 43; e.)TTE 05/18/21:  EF 50-55; f.)LHC 05/20/21: EF 50-55; g.)TTE 07/16/21: EF 60-65; h.) TTE 11/16/21: EF 60-65; i.) LHC 11/18/21: EF 55-65; j.) TTE 02/12/22: EF 60-65; k.) TTE 02/18/22: EF 55; l.) TTE 03/19/22: EF 65-70; m.) TTE 05/01/22: EF 55; n.) MV 01/27/23: EF 45-50; o.) TTE 02/12/2023: EF 55-60   Leukocytosis    Long term current use of clopidogrel    Long-term use of aspirin therapy    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   a.) surgical PCR (+) 02/15/2022 prior to CABG   NSTEMI (non-ST elevated myocardial infarction) (HCC) 01/23/2020   a.) troponins were trended: 3520 --> 2990 ng/L; b.) LHC/PCI 01/24/2020: 60% o-pRCA, 40% p-mRCA, 90% mLAD (2.75 x 22 mm Resolute Onyx DES), 40% dLAD   NSTEMI (non-ST elevated myocardial infarction) (HCC) 05/18/2021   a.) troponins were trended: 101 --> 110 --> 175 ng/L; b.) LHC/PCI 05/20/2021: 40% dLAD, 40% p-mRCA, 50% o-pRCA, 40% RPDA, 30% oLM, 85% mLAD with distal edge dissection (overlapping 2.75 x 34 mm and 2.5 x 8 mm Onyx Frontier DES), 60% D2   OSA on CPAP    Polysubstance abuse (HCC)    a.) cocaine, THC, opiates, tobacco   Postoperative cardiogenic shock (HCC) 02/19/2022   a.) required vasopressor/ionotropic agent support + IABP   Recurrent cellulitis of thigh    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 Consult with MH society determined MH unlikely (see Argwala, MD note 02/22/2022); b.) c/b postop cardiogenic shock req vasopressor/inotrophic support + IABP; c.) c/b dev of BILATERAL pleural effusions (L > R) req LEFT thoracentesis (750 cc yield)   Scrotal lesion (sebaceous cyst)    Tobacco use 11/25/2019    Surgical History: Past Surgical History:  Procedure Laterality Date   CORONARY ARTERY BYPASS GRAFT N/A 02/19/2022   Procedure: CORONARY ARTERY BYPASS GRAFTING (CABG) X THREE, USING ENDOSCOPICALLY HARVESTED RIGHT GREATER SAPHENOUS  VEIN  AND LEFT ATRIAL APPENDAGE CLIPPING;  Surgeon: Lyn Hollingshead, MD;  Location: [facility] OR;  Service: Open Heart Surgery;  Laterality: N/A;   CORONARY STENT INTERVENTION N/A 01/24/2020   Procedure: CORONARY STENT INTERVENTION;  Surgeon: Iran Ouch, MD;  Location: [facility];  Service: Cardiovascular;  Laterality: N/A;   CORONARY STENT INTERVENTION N/A 05/20/2021   Procedure: CORONARY STENT INTERVENTION;  Surgeon: Iran Ouch, MD;  Location: [facility];  Service: Cardiovascular;  Laterality: N/A;   CORONARY ULTRASOUND/IVUS N/A 05/20/2021   Procedure: Intravascular Ultrasound/IVUS;  Surgeon: Iran Ouch, MD;  Location: [facility];  Service: Cardiovascular;  Laterality: N/A;   IRRIGATION AND DEBRIDEMENT KNEE Left    LEFT ATRIAL APPENDAGE OCCLUSION Left 02/19/2022   Procedure: LEFT ATRIAL APPENDAGE OCCLUSION; Location: [facility]; Surgeon: Clare Charon, MD   LEFT HEART CATH AND CORONARY ANGIOGRAPHY N/A 01/24/2020   Procedure: LEFT HEART CATH AND CORONARY ANGIOGRAPHY;  Surgeon: Kirke Corin,  Chelsea Aus, MD;  Location: [facility];  Service: Cardiovascular;  Laterality: N/A;   LEFT HEART CATH AND CORONARY ANGIOGRAPHY N/A 11/18/2021   Procedure: LEFT HEART CATH AND CORONARY ANGIOGRAPHY;  Surgeon: Iran Ouch, MD;  Location: [facility];  Service: Cardiovascular;  Laterality: N/A;   LEFT HEART CATH AND CORONARY ANGIOGRAPHY N/A 02/13/2022   Procedure: LEFT HEART CATH AND CORONARY ANGIOGRAPHY;  Surgeon: Marykay Lex, MD;  Location: [facility];  Service: Cardiovascular;  Laterality: N/A;   LEFT HEART CATH AND CORS/GRAFTS ANGIOGRAPHY N/A 05/20/2021   Procedure: LEFT HEART CATH AND CORS/GRAFTS ANGIOGRAPHY;  Surgeon: Iran Ouch, MD;  Location: [facility];  Service: Cardiovascular;  Laterality: N/A;   LEFT HEART CATH AND CORS/GRAFTS ANGIOGRAPHY N/A 02/03/2023   Procedure: LEFT HEART CATH AND CORS/GRAFTS ANGIOGRAPHY;   Surgeon: Yvonne Kendall, MD;  Location: [facility];  Service: Cardiovascular;  Laterality: N/A;   SCROTAL EXPLORATION N/A 03/06/2023   Procedure: EXCISION OF SCROTUM LESION, SEBACEOUS CYST;  Surgeon: Sondra Come, MD;  Location: [facility];  Service: Urology;  Laterality: N/A;   TYMPANOSTOMY TUBE PLACEMENT Bilateral     Home Medications:  Allergies as of 05/26/2023       Reactions   Bee Pollen    Patient states he is not allergic to bees but has seasonal allergies to pollen        Medication List        Accurate as of May 25, 2023  8:06 PM. If you have any questions, ask your nurse or doctor.          aspirin EC 81 MG tablet Take 1 tablet (81 mg total) by mouth daily. Swallow whole.   clopidogrel 75 MG tablet Commonly known as: PLAVIX Take 1 tablet (75 mg total) by mouth daily.   famotidine 20 MG tablet Commonly known as: PEPCID Take 1 tablet (20 mg total) by mouth at bedtime as needed for heartburn or indigestion. What changed: when to take this   furosemide 40 MG tablet Commonly known as: LASIX Take 1 tablet (40 mg total) by mouth daily.   gabapentin 100 MG capsule Commonly known as: NEURONTIN Take 100 mg by mouth 2 (two) times daily as needed (pain).   HYDROcodone-acetaminophen 5-325 MG tablet Commonly known as: NORCO/VICODIN Take 2 tablets by mouth every 8 (eight) hours as needed.   imiquimod 5 % cream Commonly known as: ALDARA Apply 1 Application topically daily as needed (irritation).   isosorbide mononitrate 30 MG 24 hr tablet Commonly known as: IMDUR Take 1 tablet (30 mg total) by mouth daily.   Jardiance 10 MG Tabs tablet Generic drug: empagliflozin Take 10 mg by mouth daily.   losartan 50 MG tablet Commonly known as: COZAAR Take 1 tablet (50 mg total) by mouth daily.   metoprolol succinate 25 MG 24 hr tablet Commonly known as: TOPROL-XL Take 2 tablets (50 mg total) by mouth daily. Stop taking metoprolol if you continue to use  cocaine   nitroGLYCERIN 0.4 MG SL tablet Commonly known as: NITROSTAT Place 1 tablet (0.4 mg total) under the tongue every 5 (five) minutes as needed for chest pain (if chest pain not resolved after 3 doses, please call 911).   NON FORMULARY Pt uses a bi-pap nightly   nystatin powder Commonly known as: MYCOSTATIN/NYSTOP Apply 1 Application topically 3 (three) times daily.   ondansetron 4 MG disintegrating tablet Commonly known as: ZOFRAN-ODT Take 1 tablet (4 mg  total) by mouth every 6 (six) hours as needed for nausea or vomiting.   pantoprazole 40 MG tablet Commonly known as: PROTONIX Take 1 tablet by mouth once daily   rosuvastatin 40 MG tablet Commonly known as: CRESTOR Take 1 tablet (40 mg total) by mouth daily.   silodosin 4 MG Caps capsule Commonly known as: RAPAFLO Take 1 capsule (4 mg total) by mouth daily with breakfast.   spironolactone 25 MG tablet Commonly known as: ALDACTONE Take 1 tablet (25 mg total) by mouth daily.   Trelegy Ellipta 100-62.5-25 MCG/ACT Aepb Generic drug: Fluticasone-Umeclidin-Vilant Inhale 1 puff into the lungs daily.   triamcinolone 0.025 % cream Commonly known as: KENALOG APPLY TOPICALLY TWICE DAILY   Ventolin HFA 108 (90 Base) MCG/ACT inhaler Generic drug: albuterol Inhale 1-2 puffs into the lungs every 6 (six) hours as needed for wheezing or shortness of breath.        Allergies:  Allergies  Allergen Reactions   Bee Pollen     Patient states he is not allergic to bees but has seasonal allergies to pollen    Family History: Family History  Problem Relation Age of Onset   Coronary artery disease Mother    Arrhythmia Mother        Pacemaker placed in 2021   Heart failure Mother    Breast cancer Mother    Coronary artery disease Father    Colon cancer Father    Colon polyps Father     Social History:  reports that he has been smoking cigarettes. He started smoking about 10 years ago. He has a 10.3 pack-year smoking  history. He has been exposed to tobacco smoke. He has never used smokeless tobacco. He reports current alcohol use of about 1.0 standard drink of alcohol per week. He reports current drug use.  ROS: Pertinent ROS in HPI  Physical Exam: There were no vitals taken for this visit.  Constitutional:  Well nourished. Alert and oriented, No acute distress. HEENT: [place] AT, moist mucus membranes.  Trachea midline, no masses. Cardiovascular: No clubbing, cyanosis, or edema. Respiratory: Normal respiratory effort, no increased work of breathing. GI: Abdomen is soft, non tender, non distended, no abdominal masses. Liver and spleen not palpable.  No hernias appreciated.  Stool sample for occult testing is not indicated.   GU: No CVA tenderness.  No bladder fullness or masses.  Patient with circumcised/uncircumcised phallus. ***Foreskin easily retracted***  Urethral meatus is patent.  No penile discharge. No penile lesions or rashes. Scrotum without lesions, cysts, rashes and/or edema.  Testicles are located scrotally bilaterally. No masses are appreciated in the testicles. Left and right epididymis are normal. Rectal: Patient with  normal sphincter tone. Anus and perineum without scarring or rashes. No rectal masses are appreciated. Prostate is approximately *** grams, *** nodules are appreciated. Seminal vesicles are normal. Skin: No rashes, bruises or suspicious lesions. Lymph: No cervical or inguinal adenopathy. Neurologic: Grossly intact, no focal deficits, moving all 4 extremities. Psychiatric: Normal mood and affect.   Laboratory Data: N/A  Pertinent Imaging: ***  Assessment & Plan:    1.  Sebaceous cyst of the scrotum -would almost completely healed  2. Nocturia - will have a trial of Rapaflo 4 mg at bedtime - He will return in 1 month for IPSS and PVR  No follow-ups on file.  These notes generated with voice recognition software. I apologize for typographical errors.  Cloretta Ned  [facility] [address]  [address] [place], [place] [address] [phone]

## 2023-05-25 NOTE — Progress Notes (Signed)
 Appointment scheduled.

## 2023-05-26 ENCOUNTER — Ambulatory Visit (INDEPENDENT_AMBULATORY_CARE_PROVIDER_SITE_OTHER): Payer: Medicaid Other | Admitting: Urology

## 2023-05-26 ENCOUNTER — Encounter: Payer: Self-pay | Admitting: Urology

## 2023-05-26 VITALS — BP 107/73 | HR 81 | Ht 72.0 in | Wt 237.1 lb

## 2023-05-26 DIAGNOSIS — L723 Sebaceous cyst: Secondary | ICD-10-CM

## 2023-05-26 DIAGNOSIS — B356 Tinea cruris: Secondary | ICD-10-CM

## 2023-05-26 DIAGNOSIS — R3129 Other microscopic hematuria: Secondary | ICD-10-CM

## 2023-05-26 DIAGNOSIS — R300 Dysuria: Secondary | ICD-10-CM

## 2023-05-26 DIAGNOSIS — R351 Nocturia: Secondary | ICD-10-CM

## 2023-05-26 LAB — URINALYSIS, COMPLETE
Bilirubin, UA: NEGATIVE
Ketones, UA: NEGATIVE
Leukocytes,UA: NEGATIVE
Nitrite, UA: NEGATIVE
Specific Gravity, UA: 1.02 (ref 1.005–1.030)
Urobilinogen, Ur: 0.2 mg/dL (ref 0.2–1.0)
pH, UA: 6 (ref 5.0–7.5)

## 2023-05-26 LAB — MICROSCOPIC EXAMINATION: RBC, Urine: >30 /HPF — AB (ref 0–2)

## 2023-05-26 LAB — BLADDER SCAN AMB NON-IMAGING

## 2023-05-26 MED ORDER — TERBINAFINE HCL 1 % EX CREA: 1.0000 | TOPICAL_CREAM | Freq: Two times a day (BID) | CUTANEOUS | 0 refills | Status: DC

## 2023-05-29 ENCOUNTER — Other Ambulatory Visit: Payer: Self-pay | Admitting: Physician Assistant

## 2023-06-01 ENCOUNTER — Ambulatory Visit: Attending: Internal Medicine | Admitting: Internal Medicine

## 2023-06-01 VITALS — BP 107/77 | HR 81 | Wt 238.0 lb

## 2023-06-01 DIAGNOSIS — J449 Chronic obstructive pulmonary disease, unspecified: Secondary | ICD-10-CM | POA: Diagnosis not present

## 2023-06-01 DIAGNOSIS — F17210 Nicotine dependence, cigarettes, uncomplicated: Secondary | ICD-10-CM | POA: Diagnosis not present

## 2023-06-01 DIAGNOSIS — E669 Obesity, unspecified: Secondary | ICD-10-CM | POA: Diagnosis not present

## 2023-06-01 DIAGNOSIS — G4733 Obstructive sleep apnea (adult) (pediatric): Secondary | ICD-10-CM | POA: Diagnosis not present

## 2023-06-01 DIAGNOSIS — Z955 Presence of coronary angioplasty implant and graft: Secondary | ICD-10-CM | POA: Insufficient documentation

## 2023-06-01 DIAGNOSIS — K8020 Calculus of gallbladder without cholecystitis without obstruction: Secondary | ICD-10-CM

## 2023-06-01 DIAGNOSIS — N183 Chronic kidney disease, stage 3 unspecified: Secondary | ICD-10-CM

## 2023-06-01 DIAGNOSIS — Z6832 Body mass index (BMI) 32.0-32.9, adult: Secondary | ICD-10-CM | POA: Diagnosis not present

## 2023-06-01 DIAGNOSIS — I252 Old myocardial infarction: Secondary | ICD-10-CM | POA: Insufficient documentation

## 2023-06-01 DIAGNOSIS — I110 Hypertensive heart disease with heart failure: Secondary | ICD-10-CM | POA: Insufficient documentation

## 2023-06-01 DIAGNOSIS — R1011 Right upper quadrant pain: Secondary | ICD-10-CM | POA: Insufficient documentation

## 2023-06-01 DIAGNOSIS — N1831 Chronic kidney disease, stage 3a: Secondary | ICD-10-CM | POA: Insufficient documentation

## 2023-06-01 DIAGNOSIS — Z79899 Other long term (current) drug therapy: Secondary | ICD-10-CM | POA: Diagnosis not present

## 2023-06-01 DIAGNOSIS — F1910 Other psychoactive substance abuse, uncomplicated: Secondary | ICD-10-CM | POA: Insufficient documentation

## 2023-06-01 DIAGNOSIS — R319 Hematuria, unspecified: Secondary | ICD-10-CM | POA: Insufficient documentation

## 2023-06-01 DIAGNOSIS — I2510 Atherosclerotic heart disease of native coronary artery without angina pectoris: Secondary | ICD-10-CM | POA: Diagnosis not present

## 2023-06-01 DIAGNOSIS — I080 Rheumatic disorders of both mitral and aortic valves: Secondary | ICD-10-CM | POA: Insufficient documentation

## 2023-06-01 DIAGNOSIS — I5032 Chronic diastolic (congestive) heart failure: Secondary | ICD-10-CM | POA: Diagnosis present

## 2023-06-01 DIAGNOSIS — Z951 Presence of aortocoronary bypass graft: Secondary | ICD-10-CM | POA: Insufficient documentation

## 2023-06-01 DIAGNOSIS — Z720 Tobacco use: Secondary | ICD-10-CM

## 2023-06-01 MED ORDER — SPIRONOLACTONE 25 MG PO TABS: 25.0000 mg | ORAL_TABLET | Freq: Every day | ORAL | 3 refills | Status: DC

## 2023-06-01 NOTE — Patient Instructions (Signed)
 Medication Changes:  REFILLED Spironolactone    Testing/Procedures:  You have been ordered an ultrasound of your Right Upper Quadrant to evaluate for gallstones. You will be contacted in order to schedule this procedure.   Follow-Up in: Please follow up with the [facility] in 6 months. We currently do not have that schedule. Please call us in September in order to schedule your appointment for October.   At the [facility], you and your health needs are our priority. We have a designated team specialized in the treatment of Heart Failure. This Care Team includes your primary Heart Failure Specialized Cardiologist (physician), Advanced Practice Providers (APPs- Physician Assistants and Nurse Practitioners), and Pharmacist who all work together to provide you with the care you need, when you need it.   You may see any of the following providers on your designated Care Team at your next follow up:  Dr. Arvilla Meres Dr. Marca Ancona Dr. Dorthula Nettles Dr. Theresia Bough Clarisa Kindred, FNP Enos Fling, RPH-CPP  Please be sure to bring in all your medications bottles to every appointment.   Need to Contact us:  If you have any questions or concerns before your next appointment please send [place] a message through [employer] or call our office at [phone].    TO LEAVE A MESSAGE FOR THE NURSE SELECT OPTION 2, PLEASE LEAVE A MESSAGE INCLUDING: YOUR NAME DATE OF BIRTH CALL BACK NUMBER REASON FOR CALL**this is important as we prioritize the call backs  YOU WILL RECEIVE A CALL BACK THE SAME DAY AS LONG AS YOU CALL BEFORE 4:00 PM

## 2023-06-01 NOTE — Progress Notes (Signed)
 Advanced Heart Failure Clinic Note   Referring Physician: Dr. Azucena Cecil PCP: Donell Beers, FNP PCP-Cardiologist: Debbe Odea, MD   HPI:  Mr. Renovato is a 48 y.o. male with HTN, polysubstance abuse, tobacco use disorder, CAD s/p PCI in 04/2021, CABG in 12/23.   Hospitalized from 9/23 with NSTEMI. Cath on 9/25 showing significant two-vessel coronary artery disease which was  treated medically and on DAPT. Readmitted 12/23 with NSTEMI.  Cath 02/13/22 which showed recurrent in-stent restenosis in the LAD in the setting of continued cocaine use and poor compliance with antiplatelet medications.  Patient also has significant progression of the RCA disease.  Pre-op EF normal.   Underwent CAG 02/19/22  (LIMA - Diag, SVG - LAD, SVG - RCA). Operative course complicated by severe hyperkalemia (prior to cardiopulmonary bypass with question of malignant hyperthermia syndrome - received dantrolene). Early post-op course was notable for shock physiology and AKI. Required IABP and multiple gtts which were eventually weaned ECHO EF 55-60%. Also had some mild sternal drainage treated with abx Discharged 03/01/22.   I saw him last month and was volume overloaded with large left pleural effusion. Underwent tap with IR on 03/10/22 with 750cc out. Started on Furoscix  Saw General Cards 1/22 with concern for recurrent effusion. Stat CXR with trace effusion.   Admitted in 3/24 with CP. Felt to have pericarditis. Had mild volume overload. Echo 05/01/22  EF 55%  Seen earlier this week in Pulmonary Clinic and weight up 5 pounds so referred back top HF Clinic to reassess  Sleep study 5/24  showed severe  OSA with AHI 52/ hr  Here for f/u. Doing reasonably well. Over last few weeks has been having CP and RUQ pain after eating. Last week ate a [employer] wrap and had CP. Felt better after throwing up. Worse with greasy foods. No CP with walking or other activity. Has run out of several meds not sure if  he has Protonix. Having some BRBPR. EGD 6/24: Erythematous mucosa in the gastric body and antrum. Hasn't used CPAP over last week due to it needs to be cleaned. Not smoking much.    Cath 12/24 Severe two-vessel native coronary artery disease with up to 99% in-stent restenosis of the LAD and multifocal RCA disease of up to 95%.  Overall appearance is similar to pre-CABG native CAD. Widely patent LIMA-D2 (backfills LAD some) and SVG-RCA. Occluded SVG-LAD. Mildly elevated left ventricular filling pressure (LVEDP 22 mmHg).   Past Medical History:  Diagnosis Date   (HFpEF) heart failure with preserved ejection fraction (HCC)    a.) TTE 11/26/19: EF 50-55, G1DD; b.) TTE 01/24/20: EF 50, LVH, mid-apical antsep HK, G1DD; c.) TTE 05/18/21: EF 50-55, antsep/sep/apical HK, LVH, mild MR, AoV calc; d.) TTE 07/16/21: EF 60-65, LVH, G2DD, mild MD; e.) TTE 02/18/22: EF 55, sep/inf HK, LVH, G1DD, mild MR; f.) TTE 03/19/22: EF 65-70, antsep/periapical HK, LVH, G1DD, mild MR, AoV calc; g.) TTE 02/12/2023: EF 55-60, LVH, G2DD, mild MR   Anginal pain (HCC)    Aortic atherosclerosis (HCC)    Bilateral pleural effusion 02/20/2022   a.) s/p CABG and LAA closure (L > R) --> progressive during admission --> ultimately required LEFT thoracentesis on 03/10/2022 (750 cc serous yield)   CAD (coronary artery disease)    a.)PCI 01/24/20: 90 mLAD (2.75x22 Res Onyx); b.)PCI 05/20/21: 85 mLAD (2.75x24 + 2.5x8 Onyx); c.)LHC 11/18/21: 80 ISR + 60 mLAD - CVTS vs stent POBA; d.)LHC 02/13/22: 70 ISR p-mLAD, 80/50 ISR mLAD, 99  ISR dLAD, 80 jailed D2, diff HG RCA Dz; e.)3v CABG 02/19/22; f.)LHC 02/03/23: 30 LM, 70 p-mLAD, 80/70 mLAD, 99 dLAD, 80 D2, 70/70 pRCA, 70/95 mRCA, 40 dRCA, 60 RV branch, 60 RPDA, 100 SVG-LAD - Rx mgmt   CKD (chronic kidney disease), stage III (HCC)    Complete heart block (HCC) 02/19/2022   a.) during CABG/LAA closure --> hyperkalemic CHB in OR before heart surgery had functionally begun (not on bypass yet);  etiology unknown; ?? due to medication administration or contaminant given fairly normal preop renal function (Cr 1.3). Persisted post-bypass event with extensive dialysis equivalent via CPB pump --> Tx'd with dantrolene   Complication of anesthesia 02/19/2022   a.) perioperative HYPERkalemia - concern for atypical MH initially - Tx'd intraoperaitvely with dantrolene. Preop K+ 4.6 and CR 1.3 --> K+ peaked at 7.8 intra/postop --> rec'd furosemide + insulin + calcium + extended CBP (remained on pump at least 1 hour after procedure). Consult with MH society determined "unlikely" MH (see Argwala, MD note 02/22/2022). No confirmatory CHCT testing.   COPD (chronic obstructive pulmonary disease) (HCC)    Genital warts    GERD (gastroesophageal reflux disease)    Hiatal hernia    History of 2019 novel coronavirus disease (COVID-19) 11/25/2019   History of left atrial appendage closure 02/19/2022   a.) performed simultaneously with cardiac revascularization (CABG) procedure on 02/19/2022   Hyperlipidemia    Hypertension    Ischemic cardiomyopathy    a.) TTE 11/26/19: EF 50-55; b.)TTE 01/24/20: EF 50; c.)LHC 01/24/20: EF 45-50; d.)MV 11/29/20: EF 43; e.)TTE 05/18/21: EF 50-55; f.)LHC 05/20/21: EF 50-55; g.)TTE 07/16/21: EF 60-65; h.) TTE 11/16/21: EF 60-65; i.) LHC 11/18/21: EF 55-65; j.) TTE 02/12/22: EF 60-65; k.) TTE 02/18/22: EF 55; l.) TTE 03/19/22: EF 65-70; m.) TTE 05/01/22: EF 55; n.) MV 01/27/23: EF 45-50; o.) TTE 02/12/2023: EF 55-60   Leukocytosis    Long term current use of clopidogrel    Long-term use of aspirin therapy    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   a.) surgical PCR (+) 02/15/2022 prior to CABG   NSTEMI (non-ST elevated myocardial infarction) (HCC) 01/23/2020   a.) troponins were trended: 3520 --> 2990 ng/L; b.) LHC/PCI 01/24/2020: 60% o-pRCA, 40% p-mRCA, 90% mLAD (2.75 x 22 mm Resolute Onyx DES), 40% dLAD   NSTEMI (non-ST elevated myocardial infarction) (HCC) 05/18/2021   a.)  troponins were trended: 101 --> 110 --> 175 ng/L; b.) LHC/PCI 05/20/2021: 40% dLAD, 40% p-mRCA, 50% o-pRCA, 40% RPDA, 30% oLM, 85% mLAD with distal edge dissection (overlapping 2.75 x 34 mm and 2.5 x 8 mm Onyx Frontier DES), 60% D2   OSA on CPAP    Polysubstance abuse (HCC)    a.) cocaine, THC, opiates, tobacco   Postoperative cardiogenic shock (HCC) 02/19/2022   a.) required vasopressor/ionotropic agent support + IABP   Recurrent cellulitis of thigh    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 Consult with MH society determined MH unlikely (see Argwala, MD note 02/22/2022); b.) c/b postop cardiogenic shock req vasopressor/inotrophic support + IABP; c.) c/b dev of BILATERAL pleural effusions (L > R) req LEFT thoracentesis (750 cc yield)   Scrotal lesion (sebaceous cyst)    Tobacco use 11/25/2019    Current Outpatient Medications  Medication Sig Dispense Refill   aspirin EC 81 MG tablet Take 1 tablet (81 mg total) by mouth daily.  Swallow whole. 90 tablet 3   clopidogrel (PLAVIX) 75 MG tablet Take 1 tablet (75 mg total) by mouth daily. 90 tablet 3   empagliflozin (JARDIANCE) 10 MG TABS tablet Take 10 mg by mouth daily.     famotidine (PEPCID) 20 MG tablet Take 1 tablet (20 mg total) by mouth at bedtime as needed for heartburn or indigestion. (Patient taking differently: Take 20 mg by mouth at bedtime.) 60 tablet 0   Fluticasone-Umeclidin-Vilant (TRELEGY ELLIPTA) 100-62.5-25 MCG/ACT AEPB Inhale 1 puff into the lungs daily. 28 each 0   furosemide (LASIX) 40 MG tablet Take 1 tablet (40 mg total) by mouth daily. 90 tablet 3   isosorbide mononitrate (IMDUR) 30 MG 24 hr tablet Take 1 tablet (30 mg total) by mouth daily. 30 tablet 11   losartan (COZAAR) 50 MG tablet Take 1 tablet (50 mg total) by mouth daily. 90 tablet 3   metoprolol succinate (TOPROL-XL) 25 MG 24 hr  tablet Take 2 tablets (50 mg total) by mouth daily. Stop taking metoprolol if you continue to use cocaine 180 tablet 3   nitroGLYCERIN (NITROSTAT) 0.4 MG SL tablet Place 1 tablet (0.4 mg total) under the tongue every 5 (five) minutes as needed for chest pain (if chest pain not resolved after 3 doses, please call 911). 25 tablet PRN   NON FORMULARY Pt uses a bi-pap nightly     pantoprazole (PROTONIX) 40 MG tablet Take 1 tablet by mouth once daily 90 tablet 0   spironolactone (ALDACTONE) 25 MG tablet Take 1 tablet (25 mg total) by mouth daily. 90 tablet 3   terbinafine (LAMISIL) 1 % cream Apply 1 Application topically 2 (two) times daily. 30 g 0   VENTOLIN HFA 108 (90 Base) MCG/ACT inhaler Inhale 1-2 puffs into the lungs every 6 (six) hours as needed for wheezing or shortness of breath.     gabapentin (NEURONTIN) 100 MG capsule Take 100 mg by mouth 2 (two) times daily as needed (pain). (Patient not taking: Reported on 06/01/2023)     rosuvastatin (CRESTOR) 40 MG tablet Take 1 tablet by mouth once daily (Patient not taking: Reported on 06/01/2023) 90 tablet 0   silodosin (RAPAFLO) 4 MG CAPS capsule Take 1 capsule (4 mg total) by mouth daily with breakfast. (Patient not taking: Reported on 06/01/2023) 30 capsule 3   triamcinolone (KENALOG) 0.025 % cream APPLY TOPICALLY TWICE DAILY (Patient not taking: Reported on 06/01/2023) 30 g 0   No current facility-administered medications for this visit.    Allergies  Allergen Reactions   Bee Pollen     Patient states he is not allergic to bees but has seasonal allergies to pollen      Social History   Socioeconomic History   Marital status: Single    Spouse name: Not on file   Number of children: 3   Years of education: Not on file   Highest education level: Not on file  Occupational History   Not on file  Tobacco Use   Smoking status: Every Day    Current packs/day: 1.00    Average packs/day: 1 pack/day for 10.3 years (10.3 ttl pk-yrs)    Types:  Cigarettes    Start date: 02/12/2013    Passive exposure: Past   Smokeless tobacco: Never   Tobacco comments:    2-3 cigarettes daily- 08/06/2022        1/2 pack daily as of 01/2023  Vaping Use   Vaping status: Never Used  Substance and Sexual Activity  Alcohol use: Yes    Alcohol/week: 1.0 standard drink of alcohol    Types: 1 Shots of liquor per week    Comment: socially   Drug use: Yes    Comment: states takes gummies occasionally   Sexual activity: Yes  Other Topics Concern   Not on file  Social History Narrative   Lives with his uncle    Social Drivers of Corporate investment banker Strain: Not on file  Food Insecurity: No Food Insecurity (05/01/2022)   Hunger Vital Sign    Worried About Running Out of Food in the Last Year: Never true    Ran Out of Food in the Last Year: Never true  Transportation Needs: No Transportation Needs (05/01/2022)   PRAPARE - Administrator, Civil Service (Medical): No    Lack of Transportation (Non-Medical): No  Physical Activity: Not on file  Stress: Not on file  Social Connections: Unknown (08/05/2022)   Received from [facility], [facility]   Social Network    Social Network: Not on file  Intimate Partner Violence: Unknown (08/05/2022)   Received from [facility], [facility]   HITS    Physically Hurt: Not on file    Insult or Talk Down To: Not on file    Threaten Physical Harm: Not on file    Scream or Curse: Not on file      Family History  Problem Relation Age of Onset   Coronary artery disease Mother    Arrhythmia Mother        Pacemaker placed in 2021   Heart failure Mother    Breast cancer Mother    Coronary artery disease Father    Colon cancer Father    Colon polyps Father     Vitals:   06/01/23 1107  BP: 107/77  Pulse: 81  SpO2: 98%  Weight: 238 lb (108 kg)      Wt Readings from Last 3 Encounters:  06/01/23 238 lb (108 kg)  05/26/23 237 lb 1.6 oz (107.5 kg)  05/14/23 239 lb 6.4 oz  (108.6 kg)     PHYSICAL EXAM: General:  Well appearing. No resp difficulty HEENT: normal Neck: supple. no JVD. Carotids 2+ bilat; no bruits. No lymphadenopathy or thryomegaly appreciated. Cor: PMI nondisplaced. Regular rate & rhythm. No rubs, gallops or murmurs. Lungs: clear Abdomen: soft, nontender, nondistended. No hepatosplenomegaly. No bruits or masses. Good bowel sounds. Extremities: no cyanosis, clubbing, rash, edema Neuro: alert & orientedx3, cranial nerves grossly intact. moves all 4 extremities w/o difficulty. Affect pleasant  ASSESSMENT & PLAN:  1. Chronic diastolic HF - S/p CABG 02/19/22 - Echo 12/29 with EF 50-55%, mild AI, mild MR.  - POCUS echo 03/07/22 in clinic LVEF 60% RV ok. No pericardial effusion - Echo 05/01/22 EF 55% - Echo 12/24 EF 55-60% - Stable NYHA II - Volume OK - Continue Toprol 50 mg daily - Continue losartan 50 mg daily  - Refill spironolactone 25 mg daily.  - Continue Jardiance10 mg daily.     2. CAD with NSTEMI - Hx prior PCI/stent to LAD in 2021 and PCI/stent to LAD in 03/23 d/t in-stent restenosis - NSTEMI 12/23 S/p CABG X 3 on 02/19/22 (LIMA to Diag, SVG to LAD, SVG to RCA) - Cath 12/24 for+ stress test. Severe 2v CAD occluded SVG-LAD but LAD back fills some from LIMA to D2 - Having post-prandial CP. Doubt cardiac. Will continue PPI. Check RUQ u/s  - Stressed need for smoking cessation  3. CKD 3a -Baseline Scr 1.3-1.6 - Continue ARB/SGLT2i - Scr 03/17/23 1.4 Personally reviewed   4. Polysubstance abuse -Tobacco and cocaine - Reports complete abstinence from cocaine - Still smoking a bit.  -As above, discussed need for cessation   5. OSA, severe - Split study  showed severe  OSA with AHI 52/ hr  - Seeing Dr. Vassie Loll. On Bipap - Has CPAP but not using this past week.  Encouraged compliance  6. COPD - per Pulmonary.  - PFTs with mild obstruction   7. Obesity - Body mass index is 32.28 kg/m. - Consider GLP1RA  8. Post-prandial  RUQ/CP - suspect gallstones - check RUQ u/s  9. Hematuria - following with Urology. - CT pending  - Can hold Plavix as needed  Arvilla Meres, MD 06/01/23

## 2023-06-04 ENCOUNTER — Other Ambulatory Visit: Payer: Self-pay | Admitting: Internal Medicine

## 2023-06-04 DIAGNOSIS — Z951 Presence of aortocoronary bypass graft: Secondary | ICD-10-CM

## 2023-06-04 DIAGNOSIS — I2510 Atherosclerotic heart disease of native coronary artery without angina pectoris: Secondary | ICD-10-CM

## 2023-06-04 DIAGNOSIS — K8020 Calculus of gallbladder without cholecystitis without obstruction: Secondary | ICD-10-CM

## 2023-06-04 DIAGNOSIS — Z720 Tobacco use: Secondary | ICD-10-CM

## 2023-06-04 DIAGNOSIS — N183 Chronic kidney disease, stage 3 unspecified: Secondary | ICD-10-CM

## 2023-06-04 DIAGNOSIS — R1011 Right upper quadrant pain: Secondary | ICD-10-CM

## 2023-06-04 DIAGNOSIS — I5032 Chronic diastolic (congestive) heart failure: Secondary | ICD-10-CM

## 2023-06-05 ENCOUNTER — Ambulatory Visit
Admission: RE | Admit: 2023-06-05 | Discharge: 2023-06-05 | Disposition: A | Discharge status: Home / Self Care | Source: Ambulatory Visit | Attending: Internal Medicine | Admitting: Internal Medicine

## 2023-06-05 DIAGNOSIS — K8020 Calculus of gallbladder without cholecystitis without obstruction: Secondary | ICD-10-CM | POA: Insufficient documentation

## 2023-06-05 DIAGNOSIS — R1011 Right upper quadrant pain: Secondary | ICD-10-CM | POA: Insufficient documentation

## 2023-06-11 ENCOUNTER — Ambulatory Visit
Admission: RE | Admit: 2023-06-11 | Discharge: 2023-06-11 | Disposition: A | Discharge status: Home / Self Care | Source: Ambulatory Visit | Attending: Urology | Admitting: Urology

## 2023-06-11 ENCOUNTER — Other Ambulatory Visit: Payer: Self-pay

## 2023-06-11 DIAGNOSIS — R3129 Other microscopic hematuria: Secondary | ICD-10-CM | POA: Diagnosis present

## 2023-06-11 LAB — POCT I-STAT CREATININE: Creatinine, Ser: 1.5 mg/dL — ABNORMAL HIGH (ref 0.61–1.24)

## 2023-06-11 MED ORDER — IOHEXOL 300 MG/ML  SOLN
100.0000 mL | Freq: Once | INTRAMUSCULAR | Status: AC | PRN
  Administered 2023-06-11: 100 mL via INTRAVENOUS

## 2023-06-11 MED ORDER — EMPAGLIFLOZIN 10 MG PO TABS: 10.0000 mg | ORAL_TABLET | Freq: Every day | ORAL | 3 refills | Status: DC

## 2023-06-11 NOTE — Telephone Encounter (Signed)
 Pt came in person to have jardiance refilled to his mail order pharmacy. Refill sent in.

## 2023-06-15 NOTE — Progress Notes (Signed)
 06/16/2023 4:41 PM   Evette Hoes 1975/09/10 [id number]  Referring provider: Paseda, Folashade R, FNP [phone] S. [address], [address] [place],  [place] [address]  Urological history: 1. Scrotal sebaceous cysts -excised (03/06/2023)   Chief Complaint  Patient presents with   Follow-up   Results   HPI: Logan Crawford is a 48 y.o. male who presents today for CT results.    Previous records reviewed.   He had an incidental finding of microscopic hematuria.  At his visit on 05/26/2023, he had a complaint of nocturia and urgency.  His UA had > 30 RBC's.     A CT urogram was performed for further evaluation.   The CT urogram did not identify a cause for his hematuria, specifically no stones, hydro or renal masses.    UA yellow clear, 3+ glucose, specific area 1.015, 2+ blood, pH 6.0, 2+ protein, 0-5 WBCs, 11-30 RBCs and a few bacteria.  Patient denies any modifying or aggravating factors.  Patient denies any recent UTI's, gross hematuria, dysuria or suprapubic/flank pain.  Patient denies any fevers, chills, nausea or vomiting.     PMH: Past Medical History:  Diagnosis Date   (HFpEF) heart failure with preserved ejection fraction (HCC)    a.) TTE 11/26/19: EF 50-55, G1DD; b.) TTE 01/24/20: EF 50, LVH, mid-apical antsep HK, G1DD; c.) TTE 05/18/21: EF 50-55, antsep/sep/apical HK, LVH, mild MR, AoV calc; d.) TTE 07/16/21: EF 60-65, LVH, G2DD, mild MD; e.) TTE 02/18/22: EF 55, sep/inf HK, LVH, G1DD, mild MR; f.) TTE 03/19/22: EF 65-70, antsep/periapical HK, LVH, G1DD, mild MR, AoV calc; g.) TTE 02/12/2023: EF 55-60, LVH, G2DD, mild MR   Anginal pain (HCC)    Aortic atherosclerosis (HCC)    Bilateral pleural effusion 02/20/2022   a.) s/p CABG and LAA closure (L > R) --> progressive during admission --> ultimately required LEFT thoracentesis on 03/10/2022 (750 cc serous yield)   CAD (coronary artery disease)    a.)PCI 01/24/20: 90 mLAD (2.75x22 Res Onyx); b.)PCI 05/20/21: 85 mLAD  (2.75x24 + 2.5x8 Onyx); c.)LHC 11/18/21: 80 ISR + 60 mLAD - CVTS vs stent POBA; d.)LHC 02/13/22: 70 ISR p-mLAD, 80/50 ISR mLAD, 99 ISR dLAD, 80 jailed D2, diff HG RCA Dz; e.)3v CABG 02/19/22; f.)LHC 02/03/23: 30 LM, 70 p-mLAD, 80/70 mLAD, 99 dLAD, 80 D2, 70/70 pRCA, 70/95 mRCA, 40 dRCA, 60 RV branch, 60 RPDA, 100 SVG-LAD - Rx mgmt   CKD (chronic kidney disease), stage III (HCC)    Complete heart block (HCC) 02/19/2022   a.) during CABG/LAA closure --> hyperkalemic CHB in OR before heart surgery had functionally begun (not on bypass yet); etiology unknown; ?? due to medication administration or contaminant given fairly normal preop renal function (Cr 1.3). Persisted post-bypass event with extensive dialysis equivalent via CPB pump --> Tx'd with dantrolene    Complication of anesthesia 02/19/2022   a.) perioperative HYPERkalemia - concern for atypical MH initially - Tx'd intraoperaitvely with dantrolene . Preop K+ 4.6 and CR 1.3 --> K+ peaked at 7.8 intra/postop --> rec'd furosemide  + insulin  + calcium  + extended CBP (remained on pump at least 1 hour after procedure). Consult with MH society determined "unlikely" MH (see Argwala, MD note 02/22/2022). No confirmatory CHCT testing.   COPD (chronic obstructive pulmonary disease) (HCC)    Genital warts    GERD (gastroesophageal reflux disease)    Hiatal hernia    History of 2019 novel coronavirus disease (COVID-19) 11/25/2019   History of left atrial appendage closure 02/19/2022  a.) performed simultaneously with cardiac revascularization (CABG) procedure on 02/19/2022   Hyperlipidemia    Hypertension    Ischemic cardiomyopathy    a.) TTE 11/26/19: EF 50-55; b.)TTE 01/24/20: EF 50; c.)LHC 01/24/20: EF 45-50; d.)MV 11/29/20: EF 43; e.)TTE 05/18/21: EF 50-55; f.)LHC 05/20/21: EF 50-55; g.)TTE 07/16/21: EF 60-65; h.) TTE 11/16/21: EF 60-65; i.) LHC 11/18/21: EF 55-65; j.) TTE 02/12/22: EF 60-65; k.) TTE 02/18/22: EF 55; l.) TTE 03/19/22: EF 65-70; m.) TTE  05/01/22: EF 55; n.) MV 01/27/23: EF 45-50; o.) TTE 02/12/2023: EF 55-60   Leukocytosis    Long term current use of clopidogrel     Long-term use of aspirin  therapy    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   a.) surgical PCR (+) 02/15/2022 prior to CABG   NSTEMI (non-ST elevated myocardial infarction) (HCC) 01/23/2020   a.) troponins were trended: 3520 --> 2990 ng/L; b.) LHC/PCI 01/24/2020: 60% o-pRCA, 40% p-mRCA, 90% mLAD (2.75 x 22 mm Resolute Onyx DES), 40% dLAD   NSTEMI (non-ST elevated myocardial infarction) (HCC) 05/18/2021   a.) troponins were trended: 101 --> 110 --> 175 ng/L; b.) LHC/PCI 05/20/2021: 40% dLAD, 40% p-mRCA, 50% o-pRCA, 40% RPDA, 30% oLM, 85% mLAD with distal edge dissection (overlapping 2.75 x 34 mm and 2.5 x 8 mm Onyx Frontier DES), 60% D2   OSA on CPAP    Polysubstance abuse (HCC)    a.) cocaine, THC, opiates, tobacco   Postoperative cardiogenic shock (HCC) 02/19/2022   a.) required vasopressor/ionotropic agent support + IABP   Recurrent cellulitis of thigh    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 Consult with MH society determined MH unlikely (see Argwala, MD note 02/22/2022); b.) c/b postop cardiogenic shock req vasopressor/inotrophic support + IABP; c.) c/b dev of BILATERAL pleural effusions (L > R) req LEFT thoracentesis (750 cc yield)   Scrotal lesion (sebaceous cyst)    Tobacco use 11/25/2019    Surgical History: Past Surgical History:  Procedure Laterality Date   CORONARY ARTERY BYPASS GRAFT N/A 02/19/2022   Procedure: CORONARY ARTERY BYPASS GRAFTING (CABG) X THREE, USING ENDOSCOPICALLY HARVESTED RIGHT GREATER SAPHENOUS VEIN  AND LEFT ATRIAL APPENDAGE CLIPPING;  Surgeon: Eleanora Grew, MD;  Location: [facility];  Service: Open Heart Surgery;  Laterality: N/A;   CORONARY STENT INTERVENTION N/A 01/24/2020   Procedure: CORONARY  STENT INTERVENTION;  Surgeon: Wenona Hamilton, MD;  Location: [facility];  Service: Cardiovascular;  Laterality: N/A;   CORONARY STENT INTERVENTION N/A 05/20/2021   Procedure: CORONARY STENT INTERVENTION;  Surgeon: Wenona Hamilton, MD;  Location: [facility];  Service: Cardiovascular;  Laterality: N/A;   CORONARY ULTRASOUND/IVUS N/A 05/20/2021   Procedure: Intravascular Ultrasound/IVUS;  Surgeon: Wenona Hamilton, MD;  Location: [facility];  Service: Cardiovascular;  Laterality: N/A;   IRRIGATION AND DEBRIDEMENT KNEE Left    LEFT ATRIAL APPENDAGE OCCLUSION Left 02/19/2022   Procedure: LEFT ATRIAL APPENDAGE OCCLUSION; Location: [facility]; Surgeon: Naomi Bach, MD   LEFT HEART CATH AND CORONARY ANGIOGRAPHY N/A 01/24/2020   Procedure: LEFT HEART CATH AND CORONARY ANGIOGRAPHY;  Surgeon: Wenona Hamilton, MD;  Location: [facility];  Service: Cardiovascular;  Laterality: N/A;   LEFT HEART CATH AND CORONARY ANGIOGRAPHY N/A 11/18/2021   Procedure: LEFT HEART CATH AND CORONARY ANGIOGRAPHY;  Surgeon: Wenona Hamilton, MD;  Location: [facility];  Service:  Cardiovascular;  Laterality: N/A;   LEFT HEART CATH AND CORONARY ANGIOGRAPHY N/A 02/13/2022   Procedure: LEFT HEART CATH AND CORONARY ANGIOGRAPHY;  Surgeon: Arleen Lacer, MD;  Location: [facility];  Service: Cardiovascular;  Laterality: N/A;   LEFT HEART CATH AND CORS/GRAFTS ANGIOGRAPHY N/A 05/20/2021   Procedure: LEFT HEART CATH AND CORS/GRAFTS ANGIOGRAPHY;  Surgeon: Wenona Hamilton, MD;  Location: [facility];  Service: Cardiovascular;  Laterality: N/A;   LEFT HEART CATH AND CORS/GRAFTS ANGIOGRAPHY N/A 02/03/2023   Procedure: LEFT HEART CATH AND CORS/GRAFTS ANGIOGRAPHY;  Surgeon: Sammy Crisp, MD;  Location: [facility];  Service: Cardiovascular;  Laterality: N/A;   SCROTAL EXPLORATION N/A 03/06/2023   Procedure: EXCISION OF SCROTUM LESION, SEBACEOUS CYST;   Surgeon: Lawerence Pressman, MD;  Location: [facility];  Service: Urology;  Laterality: N/A;   TYMPANOSTOMY TUBE PLACEMENT Bilateral     Home Medications:  Allergies as of 06/16/2023       Reactions   Bee Pollen    Patient states he is not allergic to bees but has seasonal allergies to pollen        Medication List        Accurate as of June 16, 2023  4:41 PM. If you have any questions, ask your nurse or doctor.          STOP taking these medications    gabapentin  100 MG capsule Commonly known as: NEURONTIN  Stopped by: Debanhi Blaker   silodosin  4 MG Caps capsule Commonly known as: RAPAFLO  Stopped by: Emily Massar   terbinafine  1 % cream Commonly known as: LAMISIL  Stopped by: Dennette Faulconer       TAKE these medications    aspirin  EC 81 MG tablet Take 1 tablet (81 mg total) by mouth daily. Swallow whole.   clopidogrel  75 MG tablet Commonly known as: PLAVIX  Take 1 tablet (75 mg total) by mouth daily.   empagliflozin  10 MG Tabs tablet Commonly known as: Jardiance  Take 1 tablet (10 mg total) by mouth daily.   famotidine  20 MG tablet Commonly known as: PEPCID  Take 1 tablet (20 mg total) by mouth at bedtime as needed for heartburn or indigestion. What changed: when to take this   furosemide  40 MG tablet Commonly known as: LASIX  Take 1 tablet (40 mg total) by mouth daily.   isosorbide  mononitrate 30 MG 24 hr tablet Commonly known as: IMDUR  Take 1 tablet (30 mg total) by mouth daily.   losartan  50 MG tablet Commonly known as: COZAAR  Take 1 tablet (50 mg total) by mouth daily.   metoprolol  succinate 25 MG 24 hr tablet Commonly known as: TOPROL -XL Take 2 tablets (50 mg total) by mouth daily. Stop taking metoprolol  if you continue to use cocaine   nitroGLYCERIN  0.4 MG SL tablet Commonly known as: NITROSTAT  Place 1 tablet (0.4 mg total) under the tongue every 5 (five) minutes as needed for chest pain (if chest pain not resolved after 3 doses, please  call 911).   NON FORMULARY Pt uses a bi-pap nightly   pantoprazole  40 MG tablet Commonly known as: PROTONIX  Take 1 tablet by mouth once daily   rosuvastatin  40 MG tablet Commonly known as: CRESTOR  Take 1 tablet by mouth once daily   spironolactone  25 MG tablet Commonly known as: ALDACTONE  Take 1 tablet (25 mg total) by mouth daily.   Trelegy Ellipta  100-62.5-25 MCG/ACT Aepb Generic drug: Fluticasone-Umeclidin-Vilant Inhale 1 puff into the lungs daily.   triamcinolone  0.025 % cream Commonly  known as: KENALOG  APPLY TOPICALLY TWICE DAILY   Ventolin  HFA 108 (90 Base) MCG/ACT inhaler Generic drug: albuterol  Inhale 1-2 puffs into the lungs every 6 (six) hours as needed for wheezing or shortness of breath.        Allergies:  Allergies  Allergen Reactions   Bee Pollen     Patient states he is not allergic to bees but has seasonal allergies to pollen    Family History: Family History  Problem Relation Age of Onset   Coronary artery disease Mother    Arrhythmia Mother        Pacemaker placed in 2021   Heart failure Mother    Breast cancer Mother    Coronary artery disease Father    Colon cancer Father    Colon polyps Father     Social History:  reports that he has been smoking cigarettes. He started smoking about 10 years ago. He has a 10.3 pack-year smoking history. He has been exposed to tobacco smoke. He has never used smokeless tobacco. He reports current alcohol use of about 1.0 standard drink of alcohol per week. He reports current drug use.  ROS: Pertinent ROS in HPI  Physical Exam: BP 111/72   Pulse 74   Ht 6' (1.829 m)   Wt 238 lb 6.4 oz (108.1 kg)   BMI 32.33 kg/m   Constitutional:  Well nourished. Alert and oriented, No acute distress. HEENT: [place] AT, moist mucus membranes.  Trachea midline Cardiovascular: No clubbing, cyanosis, or edema. Respiratory: Normal respiratory effort, no increased work of breathing. Neurologic: Grossly intact, no focal  deficits, moving all 4 extremities. Psychiatric: Normal mood and affect.   Laboratory Data: N/A  Pertinent Imaging: CLINICAL DATA:  microscopic hematuria   EXAM: CT ABDOMEN AND PELVIS WITHOUT AND WITH CONTRAST   TECHNIQUE: Multidetector CT imaging of the abdomen and pelvis was performed following the standard protocol before and following the bolus administration of intravenous contrast.   RADIATION DOSE REDUCTION: This exam was performed according to the departmental dose-optimization program which includes automated exposure control, adjustment of the mA and/or kV according to patient size and/or use of iterative reconstruction technique.   CONTRAST:  OMNIPAQUE  IOHEXOL  300 MG/ML  SOLN   COMPARISON:  Abdominal ultrasound 06/05/2023.   FINDINGS: Lower chest: No acute abnormality.   Hepatobiliary: Hepatic steatosis. No focal liver lesion. Normal gallbladder. No biliary ductal dilation.   Pancreas: Unremarkable. No pancreatic ductal dilatation or surrounding inflammatory changes.   Spleen: Normal in size without focal abnormality.   Adrenals/Urinary Tract: Adrenal glands are unremarkable. Kidneys are normal, without renal calculi, focal lesion, or hydronephrosis. Bladder is unremarkable.   Stomach/Bowel: Stomach is within normal limits. Appendix appears normal. Colonic diverticulosis. No evidence of bowel wall thickening, distention, or inflammatory changes.   Vascular/Lymphatic: Aortic atherosclerosis. No enlarged abdominal or pelvic lymph nodes.   Reproductive: Prostate is unremarkable.   Other: No abdominal wall hernia or abnormality. No abdominopelvic ascites.   Musculoskeletal: No acute or significant osseous findings. Multilevel degenerative changes.   IMPRESSION: 1. No hydronephrosis, nephrolithiasis, or enhancing urothelial lesion. 2. Hepatic steatosis.     Electronically Signed   By: Rox Cope M.D.   On: 06/15/2023 13:35  I have  independently reviewed the films.  See HPI.    Assessment & Plan:    1.  Sebaceous cyst of the scrotum -wound completely healed  2. Nocturia -has severe sleep apnea -encouraged CPAP use  3. Microscopic hematuria - CT urogram did not identify  any causes for hematuria - will send urine for culture, GC/chlamydia and atypical's  - if positive will treat and then reassess, if negative, then pursue cystoscopy   4.  Tinea cruris -Prescribed Lamisil  to be applied twice daily to the rash -Will reassess once he completes hematuria workup    Return for pending culture results .  These notes generated with voice recognition software. I apologize for typographical errors.  Briant Camper  [facility] [address]  [address] [place], [place] [address] [phone]

## 2023-06-16 ENCOUNTER — Encounter: Payer: Self-pay | Admitting: Urology

## 2023-06-16 ENCOUNTER — Ambulatory Visit (INDEPENDENT_AMBULATORY_CARE_PROVIDER_SITE_OTHER): Admitting: Urology

## 2023-06-16 VITALS — BP 111/72 | HR 74 | Ht 72.0 in | Wt 238.4 lb

## 2023-06-16 DIAGNOSIS — R351 Nocturia: Secondary | ICD-10-CM

## 2023-06-16 DIAGNOSIS — B356 Tinea cruris: Secondary | ICD-10-CM

## 2023-06-16 DIAGNOSIS — L723 Sebaceous cyst: Secondary | ICD-10-CM | POA: Diagnosis not present

## 2023-06-16 DIAGNOSIS — R3129 Other microscopic hematuria: Secondary | ICD-10-CM | POA: Diagnosis not present

## 2023-06-16 LAB — URINALYSIS, COMPLETE
Bilirubin, UA: NEGATIVE
Ketones, UA: NEGATIVE
Leukocytes,UA: NEGATIVE
Nitrite, UA: NEGATIVE
Specific Gravity, UA: 1.015 (ref 1.005–1.030)
Urobilinogen, Ur: 0.2 mg/dL (ref 0.2–1.0)
pH, UA: 6.5 (ref 5.0–7.5)

## 2023-06-16 LAB — MICROSCOPIC EXAMINATION

## 2023-06-18 LAB — GC/CHLAMYDIA PROBE AMP
Chlamydia trachomatis, NAA: NEGATIVE
Neisseria Gonorrhoeae by PCR: NEGATIVE

## 2023-06-19 LAB — CULTURE, URINE COMPREHENSIVE

## 2023-06-22 ENCOUNTER — Other Ambulatory Visit: Payer: Self-pay | Admitting: Physician Assistant

## 2023-06-22 LAB — MYCOPLASMA / UREAPLASMA CULTURE
Mycoplasma hominis Culture: NEGATIVE
Ureaplasma urealyticum: POSITIVE — AB

## 2023-06-23 ENCOUNTER — Other Ambulatory Visit: Payer: Self-pay

## 2023-06-23 DIAGNOSIS — R3129 Other microscopic hematuria: Secondary | ICD-10-CM

## 2023-06-23 DIAGNOSIS — R399 Unspecified symptoms and signs involving the genitourinary system: Secondary | ICD-10-CM

## 2023-06-23 MED ORDER — DOXYCYCLINE HYCLATE 100 MG PO CAPS: 100.0000 mg | ORAL_CAPSULE | Freq: Two times a day (BID) | ORAL | 0 refills | Status: DC

## 2023-07-06 ENCOUNTER — Other Ambulatory Visit

## 2023-07-06 DIAGNOSIS — R3129 Other microscopic hematuria: Secondary | ICD-10-CM

## 2023-07-06 DIAGNOSIS — R399 Unspecified symptoms and signs involving the genitourinary system: Secondary | ICD-10-CM

## 2023-07-06 LAB — MICROSCOPIC EXAMINATION: Bacteria, UA: NONE SEEN

## 2023-07-06 LAB — URINALYSIS, COMPLETE
Bilirubin, UA: NEGATIVE
Ketones, UA: NEGATIVE
Leukocytes,UA: NEGATIVE
Nitrite, UA: NEGATIVE
Protein,UA: NEGATIVE
Specific Gravity, UA: 1.01 (ref 1.005–1.030)
Urobilinogen, Ur: 0.2 mg/dL (ref 0.2–1.0)
pH, UA: 6 (ref 5.0–7.5)

## 2023-07-08 ENCOUNTER — Other Ambulatory Visit: Payer: Self-pay | Admitting: Physician Assistant

## 2023-07-08 ENCOUNTER — Other Ambulatory Visit: Payer: Self-pay | Admitting: Nurse Practitioner

## 2023-07-08 ENCOUNTER — Other Ambulatory Visit: Payer: Self-pay | Admitting: Internal Medicine

## 2023-07-08 DIAGNOSIS — K219 Gastro-esophageal reflux disease without esophagitis: Secondary | ICD-10-CM

## 2023-07-13 LAB — MYCOPLASMA / UREAPLASMA CULTURE
Mycoplasma hominis Culture: NEGATIVE
Ureaplasma urealyticum: NEGATIVE

## 2023-07-15 ENCOUNTER — Other Ambulatory Visit: Payer: Self-pay

## 2023-07-15 MED ORDER — LOSARTAN POTASSIUM 50 MG PO TABS: 50.0000 mg | ORAL_TABLET | Freq: Every day | ORAL | 3 refills | Status: DC

## 2023-07-15 MED ORDER — CLOPIDOGREL BISULFATE 75 MG PO TABS: 75.0000 mg | ORAL_TABLET | Freq: Every day | ORAL | 3 refills | Status: DC

## 2023-07-15 MED ORDER — FUROSEMIDE 40 MG PO TABS: 40.0000 mg | ORAL_TABLET | Freq: Every day | ORAL | 3 refills | Status: DC

## 2023-07-17 ENCOUNTER — Emergency Department

## 2023-07-17 ENCOUNTER — Other Ambulatory Visit: Payer: Self-pay

## 2023-07-17 ENCOUNTER — Emergency Department
Admission: EM | Admit: 2023-07-17 | Discharge: 2023-07-17 | Disposition: A | Discharge status: Home / Self Care | Attending: Emergency Medicine | Admitting: Emergency Medicine

## 2023-07-17 DIAGNOSIS — N183 Chronic kidney disease, stage 3 unspecified: Secondary | ICD-10-CM | POA: Diagnosis not present

## 2023-07-17 DIAGNOSIS — J449 Chronic obstructive pulmonary disease, unspecified: Secondary | ICD-10-CM | POA: Diagnosis not present

## 2023-07-17 DIAGNOSIS — R55 Syncope and collapse: Secondary | ICD-10-CM | POA: Diagnosis present

## 2023-07-17 DIAGNOSIS — I2510 Atherosclerotic heart disease of native coronary artery without angina pectoris: Secondary | ICD-10-CM | POA: Diagnosis not present

## 2023-07-17 DIAGNOSIS — Z7982 Long term (current) use of aspirin: Secondary | ICD-10-CM | POA: Insufficient documentation

## 2023-07-17 DIAGNOSIS — Z79899 Other long term (current) drug therapy: Secondary | ICD-10-CM | POA: Insufficient documentation

## 2023-07-17 DIAGNOSIS — Z8616 Personal history of COVID-19: Secondary | ICD-10-CM | POA: Diagnosis not present

## 2023-07-17 DIAGNOSIS — I5030 Unspecified diastolic (congestive) heart failure: Secondary | ICD-10-CM | POA: Diagnosis not present

## 2023-07-17 DIAGNOSIS — Z7902 Long term (current) use of antithrombotics/antiplatelets: Secondary | ICD-10-CM | POA: Diagnosis not present

## 2023-07-17 DIAGNOSIS — Z720 Tobacco use: Secondary | ICD-10-CM | POA: Diagnosis not present

## 2023-07-17 DIAGNOSIS — Z951 Presence of aortocoronary bypass graft: Secondary | ICD-10-CM | POA: Insufficient documentation

## 2023-07-17 DIAGNOSIS — I130 Hypertensive heart and chronic kidney disease with heart failure and stage 1 through stage 4 chronic kidney disease, or unspecified chronic kidney disease: Secondary | ICD-10-CM | POA: Diagnosis not present

## 2023-07-17 LAB — COMPREHENSIVE METABOLIC PANEL WITH GFR
ALT: 25 U/L (ref 0–44)
AST: 30 U/L (ref 15–41)
Albumin: 3.8 g/dL (ref 3.5–5.0)
Alkaline Phosphatase: 82 U/L (ref 38–126)
Anion gap: 10 (ref 5–15)
BUN: 7 mg/dL (ref 6–20)
CO2: 25 mmol/L (ref 22–32)
Calcium: 9 mg/dL (ref 8.9–10.3)
Chloride: 105 mmol/L (ref 98–111)
Creatinine, Ser: 1.29 mg/dL — ABNORMAL HIGH (ref 0.61–1.24)
GFR, Estimated: >60 mL/min (ref >60)
Glucose, Bld: 110 mg/dL — ABNORMAL HIGH (ref 70–99)
Potassium: 3.6 mmol/L (ref 3.5–5.1)
Sodium: 140 mmol/L (ref 135–145)
Total Bilirubin: 0.7 mg/dL (ref 0.0–1.2)
Total Protein: 7.4 g/dL (ref 6.5–8.1)

## 2023-07-17 LAB — PROTIME-INR
INR: 0.9 (ref 0.8–1.2)
Prothrombin Time: 12.7 s (ref 11.4–15.2)

## 2023-07-17 LAB — CBC WITH DIFFERENTIAL/PLATELET
Abs Immature Granulocytes: 0.03 10*3/uL (ref 0.00–0.07)
Basophils Absolute: 0.1 10*3/uL (ref 0.0–0.1)
Basophils Relative: 1 %
Eosinophils Absolute: 0.5 10*3/uL (ref 0.0–0.5)
Eosinophils Relative: 7 %
HCT: 46.8 % (ref 39.0–52.0)
Hemoglobin: 15.3 g/dL (ref 13.0–17.0)
Immature Granulocytes: 0 %
Lymphocytes Relative: 27 %
Lymphs Abs: 2.2 10*3/uL (ref 0.7–4.0)
MCH: 26.7 pg (ref 26.0–34.0)
MCHC: 32.7 g/dL (ref 30.0–36.0)
MCV: 81.7 fL (ref 80.0–100.0)
Monocytes Absolute: 0.5 10*3/uL (ref 0.1–1.0)
Monocytes Relative: 6 %
Neutro Abs: 4.8 10*3/uL (ref 1.7–7.7)
Neutrophils Relative %: 59 %
Platelets: 305 10*3/uL (ref 150–400)
RBC: 5.73 MIL/uL (ref 4.22–5.81)
RDW: 17.6 % — ABNORMAL HIGH (ref 11.5–15.5)
WBC: 8.1 10*3/uL (ref 4.0–10.5)
nRBC: 0 % (ref 0.0–0.2)

## 2023-07-17 LAB — CBG MONITORING, ED: Glucose-Capillary: 105 mg/dL — ABNORMAL HIGH (ref 70–99)

## 2023-07-17 LAB — TYPE AND SCREEN
ABO/RH(D): O POS
Antibody Screen: NEGATIVE

## 2023-07-17 LAB — TROPONIN I (HIGH SENSITIVITY)
Troponin I (High Sensitivity): 13 ng/L (ref <18)
Troponin I (High Sensitivity): 12 ng/L (ref <18)

## 2023-07-17 MED ORDER — SODIUM CHLORIDE 0.9 % IV BOLUS (SEPSIS)
1000.0000 mL | Freq: Once | INTRAVENOUS | Status: AC
  Administered 2023-07-17: 1000 mL via INTRAVENOUS

## 2023-07-17 NOTE — ED Triage Notes (Signed)
 BIB EMS for near syncope while playing pool tonight. Patient has extensive cardiac hx with an MI and CABG x 3. Patient denies any chest pain, but states he felt short of breath this morning which was relieved with an albuterol  inhaler.

## 2023-07-17 NOTE — ED Notes (Signed)
 Orthostatic vitals: Laying: BP: 131/74 (87) Pulse: 83 Sitting: 130/88(100) Pulse: 88 Standing: 123/82(93)  Pulse: 94

## 2023-07-17 NOTE — ED Provider Notes (Signed)
 [facility] Provider Note    Event Date/Time   First MD Initiated Contact with Patient 07/17/23 0024     (approximate)   History   Near Syncope   HPI  Logan Crawford is a 48 y.o. male with history of CHF, CAD status post CABG, complete heart block, chronic kidney disease, hypertension, hyperlipidemia, polysubstance abuse who presents to the emergency department complaints of feeling lightheaded like he may pass out today.  Patient reports he did have some shortness of breath earlier today that has resolved after using an albuterol  inhaler.  No chest pain or chest discomfort.  No vomiting or diarrhea.  Did see a small amount of dark blood with a bowel movement today but states he has hemorrhoids.  No abdominal pain.  Patient is on aspirin , Plavix .  Denies prior history of PE or DVT.  No lower extremity swelling or discomfort.  Patient does report he has not eaten in several hours and thinks this is likely the cause of his lightheadedness.   History provided by patient.    Past Medical History:  Diagnosis Date   (HFpEF) heart failure with preserved ejection fraction (HCC)    a.) TTE 11/26/19: EF 50-55, G1DD; b.) TTE 01/24/20: EF 50, LVH, mid-apical antsep HK, G1DD; c.) TTE 05/18/21: EF 50-55, antsep/sep/apical HK, LVH, mild MR, AoV calc; d.) TTE 07/16/21: EF 60-65, LVH, G2DD, mild MD; e.) TTE 02/18/22: EF 55, sep/inf HK, LVH, G1DD, mild MR; f.) TTE 03/19/22: EF 65-70, antsep/periapical HK, LVH, G1DD, mild MR, AoV calc; g.) TTE 02/12/2023: EF 55-60, LVH, G2DD, mild MR   Anginal pain (HCC)    Aortic atherosclerosis (HCC)    Bilateral pleural effusion 02/20/2022   a.) s/p CABG and LAA closure (L > R) --> progressive during admission --> ultimately required LEFT thoracentesis on 03/10/2022 (750 cc serous yield)   CAD (coronary artery disease)    a.)PCI 01/24/20: 90 mLAD (2.75x22 Res Onyx); b.)PCI 05/20/21: 85 mLAD (2.75x24 + 2.5x8 Onyx); c.)LHC 11/18/21: 80 ISR + 60  mLAD - CVTS vs stent POBA; d.)LHC 02/13/22: 70 ISR p-mLAD, 80/50 ISR mLAD, 99 ISR dLAD, 80 jailed D2, diff HG RCA Dz; e.)3v CABG 02/19/22; f.)LHC 02/03/23: 30 LM, 70 p-mLAD, 80/70 mLAD, 99 dLAD, 80 D2, 70/70 pRCA, 70/95 mRCA, 40 dRCA, 60 RV branch, 60 RPDA, 100 SVG-LAD - Rx mgmt   CKD (chronic kidney disease), stage III (HCC)    Complete heart block (HCC) 02/19/2022   a.) during CABG/LAA closure --> hyperkalemic CHB in OR before heart surgery had functionally begun (not on bypass yet); etiology unknown; ?? due to medication administration or contaminant given fairly normal preop renal function (Cr 1.3). Persisted post-bypass event with extensive dialysis equivalent via CPB pump --> Tx'd with dantrolene    Complication of anesthesia 02/19/2022   a.) perioperative HYPERkalemia - concern for atypical MH initially - Tx'd intraoperaitvely with dantrolene . Preop K+ 4.6 and CR 1.3 --> K+ peaked at 7.8 intra/postop --> rec'd furosemide  + insulin  + calcium  + extended CBP (remained on pump at least 1 hour after procedure). Consult with MH society determined "unlikely" MH (see Argwala, MD note 02/22/2022). No confirmatory CHCT testing.   COPD (chronic obstructive pulmonary disease) (HCC)    Genital warts    GERD (gastroesophageal reflux disease)    Hiatal hernia    History of 2019 novel coronavirus disease (COVID-19) 11/25/2019   History of left atrial appendage closure 02/19/2022   a.) performed simultaneously with cardiac revascularization (CABG) procedure on 02/19/2022  Hyperlipidemia    Hypertension    Ischemic cardiomyopathy    a.) TTE 11/26/19: EF 50-55; b.)TTE 01/24/20: EF 50; c.)LHC 01/24/20: EF 45-50; d.)MV 11/29/20: EF 43; e.)TTE 05/18/21: EF 50-55; f.)LHC 05/20/21: EF 50-55; g.)TTE 07/16/21: EF 60-65; h.) TTE 11/16/21: EF 60-65; i.) LHC 11/18/21: EF 55-65; j.) TTE 02/12/22: EF 60-65; k.) TTE 02/18/22: EF 55; l.) TTE 03/19/22: EF 65-70; m.) TTE 05/01/22: EF 55; n.) MV 01/27/23: EF 45-50; o.) TTE  02/12/2023: EF 55-60   Leukocytosis    Long term current use of clopidogrel     Long-term use of aspirin  therapy    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   a.) surgical PCR (+) 02/15/2022 prior to CABG   NSTEMI (non-ST elevated myocardial infarction) (HCC) 01/23/2020   a.) troponins were trended: 3520 --> 2990 ng/L; b.) LHC/PCI 01/24/2020: 60% o-pRCA, 40% p-mRCA, 90% mLAD (2.75 x 22 mm Resolute Onyx DES), 40% dLAD   NSTEMI (non-ST elevated myocardial infarction) (HCC) 05/18/2021   a.) troponins were trended: 101 --> 110 --> 175 ng/L; b.) LHC/PCI 05/20/2021: 40% dLAD, 40% p-mRCA, 50% o-pRCA, 40% RPDA, 30% oLM, 85% mLAD with distal edge dissection (overlapping 2.75 x 34 mm and 2.5 x 8 mm Onyx Frontier DES), 60% D2   OSA on CPAP    Polysubstance abuse (HCC)    a.) cocaine, THC, opiates, tobacco   Postoperative cardiogenic shock (HCC) 02/19/2022   a.) required vasopressor/ionotropic agent support + IABP   Recurrent cellulitis of thigh    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 Consult with MH society determined MH unlikely (see Argwala, MD note 02/22/2022); b.) c/b postop cardiogenic shock req vasopressor/inotrophic support + IABP; c.) c/b dev of BILATERAL pleural effusions (L > R) req LEFT thoracentesis (750 cc yield)   Scrotal lesion (sebaceous cyst)    Tobacco use 11/25/2019    Past Surgical History:  Procedure Laterality Date   CORONARY ARTERY BYPASS GRAFT N/A 02/19/2022   Procedure: CORONARY ARTERY BYPASS GRAFTING (CABG) X THREE, USING ENDOSCOPICALLY HARVESTED RIGHT GREATER SAPHENOUS VEIN  AND LEFT ATRIAL APPENDAGE CLIPPING;  Surgeon: Eleanora Grew, MD;  Location: [facility] OR;  Service: Open Heart Surgery;  Laterality: N/A;   CORONARY STENT INTERVENTION N/A 01/24/2020   Procedure: CORONARY STENT INTERVENTION;  Surgeon: Wenona Hamilton, MD;  Location: [facility]  INVASIVE CV LAB;  Service: Cardiovascular;  Laterality: N/A;   CORONARY STENT INTERVENTION N/A 05/20/2021   Procedure: CORONARY STENT INTERVENTION;  Surgeon: Wenona Hamilton, MD;  Location: [facility] INVASIVE CV LAB;  Service: Cardiovascular;  Laterality: N/A;   CORONARY ULTRASOUND/IVUS N/A 05/20/2021   Procedure: Intravascular Ultrasound/IVUS;  Surgeon: Wenona Hamilton, MD;  Location: [facility] INVASIVE CV LAB;  Service: Cardiovascular;  Laterality: N/A;   IRRIGATION AND DEBRIDEMENT KNEE Left    LEFT ATRIAL APPENDAGE OCCLUSION Left 02/19/2022   Procedure: LEFT ATRIAL APPENDAGE OCCLUSION; Location: [facility]; Surgeon: Naomi Bach, MD   LEFT HEART CATH AND CORONARY ANGIOGRAPHY N/A 01/24/2020   Procedure: LEFT HEART CATH AND CORONARY ANGIOGRAPHY;  Surgeon: Wenona Hamilton, MD;  Location: [facility] INVASIVE CV LAB;  Service: Cardiovascular;  Laterality: N/A;   LEFT HEART CATH AND CORONARY ANGIOGRAPHY N/A 11/18/2021   Procedure: LEFT HEART CATH AND CORONARY ANGIOGRAPHY;  Surgeon: Wenona Hamilton, MD;  Location: [facility] INVASIVE CV LAB;  Service: Cardiovascular;  Laterality: N/A;   LEFT HEART CATH AND CORONARY ANGIOGRAPHY N/A 02/13/2022  Procedure: LEFT HEART CATH AND CORONARY ANGIOGRAPHY;  Surgeon: Arleen Lacer, MD;  Location: [facility] INVASIVE CV LAB;  Service: Cardiovascular;  Laterality: N/A;   LEFT HEART CATH AND CORS/GRAFTS ANGIOGRAPHY N/A 05/20/2021   Procedure: LEFT HEART CATH AND CORS/GRAFTS ANGIOGRAPHY;  Surgeon: Wenona Hamilton, MD;  Location: [facility] INVASIVE CV LAB;  Service: Cardiovascular;  Laterality: N/A;   LEFT HEART CATH AND CORS/GRAFTS ANGIOGRAPHY N/A 02/03/2023   Procedure: LEFT HEART CATH AND CORS/GRAFTS ANGIOGRAPHY;  Surgeon: Sammy Crisp, MD;  Location: [facility] INVASIVE CV LAB;  Service: Cardiovascular;  Laterality: N/A;   SCROTAL EXPLORATION N/A 03/06/2023   Procedure: EXCISION OF SCROTUM LESION, SEBACEOUS CYST;  Surgeon: Lawerence Pressman, MD;  Location: [facility] ORS;  Service: Urology;   Laterality: N/A;   TYMPANOSTOMY TUBE PLACEMENT Bilateral     MEDICATIONS:  Prior to Admission medications   Medication Sig Start Date End Date Taking? Authorizing Provider  aspirin  EC 81 MG tablet Take 1 tablet (81 mg total) by mouth daily. Swallow whole. 03/17/22   Roark Chick, PA-C  clopidogrel  (PLAVIX ) 75 MG tablet Take 1 tablet (75 mg total) by mouth daily. 07/15/23   Dunn, Elvia Hammans, PA-C  doxycycline  (VIBRAMYCIN ) 100 MG capsule Take 1 capsule (100 mg total) by mouth 2 (two) times daily. 06/23/23   Matilde Son A, PA-C  empagliflozin  (JARDIANCE ) 10 MG TABS tablet Take 1 tablet (10 mg total) by mouth daily. 06/11/23   Bensimhon, Rheta Celestine, MD  famotidine  (PEPCID ) 20 MG tablet Take 1 tablet (20 mg total) by mouth at bedtime as needed for heartburn or indigestion. Patient taking differently: Take 20 mg by mouth at bedtime. 10/20/22   Paseda, Folashade R, FNP  Fluticasone-Umeclidin-Vilant (TRELEGY ELLIPTA ) 100-62.5-25 MCG/ACT AEPB Inhale 1 puff into the lungs daily. 08/06/22   Vergia Glasgow, MD  furosemide  (LASIX ) 40 MG tablet Take 1 tablet (40 mg total) by mouth daily. 07/15/23   Roark Chick, PA-C  isosorbide  mononitrate (IMDUR ) 30 MG 24 hr tablet Take 1 tablet (30 mg total) by mouth daily. 04/10/23 04/09/24  Bensimhon, Rheta Celestine, MD  losartan  (COZAAR ) 50 MG tablet Take 1 tablet (50 mg total) by mouth daily. 07/15/23   Dunn, Elvia Hammans, PA-C  metoprolol  succinate (TOPROL -XL) 25 MG 24 hr tablet TAKE 2 TABLETS BY MOUTH DAILY. STOP TAKING METOPROLOL  IF YOU CONTINUE TO USE COCAINE 07/09/23   Bensimhon, Rheta Celestine, MD  nitroGLYCERIN  (NITROSTAT ) 0.4 MG SL tablet Place 1 tablet (0.4 mg total) under the tongue every 5 (five) minutes as needed for chest pain (if chest pain not resolved after 3 doses, please call 911). 02/03/23   End, Veryl Gottron, MD  NON FORMULARY Pt uses a bi-pap nightly    [provider]  pantoprazole  (PROTONIX ) 40 MG tablet Take 1 tablet by mouth once daily 07/08/23   Paseda, Folashade R,  FNP  rosuvastatin  (CRESTOR ) 40 MG tablet Take 1 tablet by mouth once daily 05/29/23   Roark Chick, PA-C  spironolactone  (ALDACTONE ) 25 MG tablet Take 1 tablet (25 mg total) by mouth daily. 06/01/23   Bensimhon, Rheta Celestine, MD  triamcinolone  (KENALOG ) 0.025 % cream APPLY TOPICALLY TWICE DAILY 04/14/23   Paseda, Folashade R, FNP  VENTOLIN  HFA 108 (90 Base) MCG/ACT inhaler Inhale 1-2 puffs into the lungs every 6 (six) hours as needed for wheezing or shortness of breath. 06/18/22   [provider]    Physical Exam   Triage Vital Signs: ED Triage Vitals  Encounter Vitals Group     BP  07/17/23 0015 133/81     Systolic BP Percentile --      Diastolic BP Percentile --      Pulse Rate 07/17/23 0015 87     Resp 07/17/23 0015 18     Temp 07/17/23 0015 97.9 F (36.6 C)     Temp Source 07/17/23 0015 Oral     SpO2 07/17/23 0015 95 %     Weight --      Height --      Head Circumference --      Peak Flow --      Pain Score 07/17/23 0016 0     Pain Loc --      Pain Education --      Exclude from Growth Chart --     Most recent vital signs: Vitals:   07/17/23 0357 07/17/23 0406  BP:  135/89  Pulse: 90   Resp: 17   Temp:    SpO2: 95%     CONSTITUTIONAL: Alert, responds appropriately to questions. Well-appearing; well-nourished HEAD: Normocephalic, atraumatic EYES: Conjunctivae clear, pupils appear equal, sclera nonicteric ENT: normal nose; moist mucous membranes NECK: Supple, normal ROM CARD: RRR; S1 and S2 appreciated RESP: Normal chest excursion without splinting or tachypnea; breath sounds clear and equal bilaterally; no wheezes, no rhonchi, no rales, no hypoxia or respiratory distress, speaking full sentences ABD/GI: Non-distended; soft, non-tender, no rebound, no guarding, no peritoneal signs RECTAL:  Normal rectal tone, no gross blood or melena, guaiac NEGATIVE, no hemorrhoids appreciated, nontender rectal exam, no fecal impaction. Chaperone present. BACK: The back appears  normal EXT: Normal ROM in all joints; no deformity noted, no edema, no calf tenderness or calf swelling SKIN: Normal color for age and race; warm; no rash on exposed skin NEURO: Moves all extremities equally, normal speech PSYCH: The patient's mood and manner are appropriate.   ED Results / Procedures / Treatments   LABS: (all labs ordered are listed, but only abnormal results are displayed) Labs Reviewed  CBC WITH DIFFERENTIAL/PLATELET - Abnormal; Notable for the following components:      Result Value   RDW 17.6 (*)    All other components within normal limits  COMPREHENSIVE METABOLIC PANEL WITH GFR - Abnormal; Notable for the following components:   Glucose, Bld 110 (*)    Creatinine, Ser 1.29 (*)    All other components within normal limits  CBG MONITORING, ED - Abnormal; Notable for the following components:   Glucose-Capillary 105 (*)    All other components within normal limits  PROTIME-INR  TYPE AND SCREEN  TROPONIN I (HIGH SENSITIVITY)  TROPONIN I (HIGH SENSITIVITY)     EKG:  EKG Interpretation Date/Time:  Friday Jul 17 2023 00:12:38 EDT Ventricular Rate:  89 PR Interval:  170 QRS Duration:  98 QT Interval:  385 QTC Calculation: 469 R Axis:   89  Text Interpretation: Sinus rhythm Probable left atrial enlargement Probable anterolateral infarct, old Abnrm T, consider ischemia, anterolateral lds No significant change since last tracing Confirmed by Verneda Golder [phone]) on 07/17/2023 12:42:07 AM         RADIOLOGY: My personal review and interpretation of imaging: Chest x-ray clear.  I have personally reviewed all radiology reports.   DG Chest Portable 1 View Result Date: 07/17/2023 CLINICAL DATA:  Near syncope. EXAM: PORTABLE CHEST 1 VIEW COMPARISON:  March 17, 2023 FINDINGS: Multiple sternal wires and vascular clips are noted. The heart size and mediastinal contours are within normal limits. A left atrial appendage clip is  suspected. A coronary artery stent  is also seen. Low lung volumes are noted. Both lungs are clear. The visualized skeletal structures are unremarkable. IMPRESSION: 1. Evidence of prior median sternotomy/CABG. 2. No active cardiopulmonary disease. Electronically Signed   By: Virgle Grime M.D.   On: 07/17/2023 01:05     PROCEDURES:  Critical Care performed: No      .1-3 Lead EKG Interpretation  Performed by: Bayyinah Dukeman, Clover Dao, DO Authorized by: Holman Bonsignore, Clover Dao, DO     Interpretation: normal     ECG rate:  90   ECG rate assessment: normal     Rhythm: sinus rhythm     Ectopy: none     Conduction: normal       IMPRESSION / MDM / ASSESSMENT AND PLAN / ED COURSE  I reviewed the triage vital signs and the nursing notes.    Patient here with lightheadedness, near syncope.  Does have significant cardiac history.  The patient is on the cardiac monitor to evaluate for evidence of arrhythmia and/or significant heart rate changes.   DIFFERENTIAL DIAGNOSIS (includes but not limited to):   Lightheadedness secondary to decreased oral intake, dehydration, anemia, electrolyte derangement, cardiac arrhythmia, ACS, less likely PE, dissection.  Doubt stroke.   Patient's presentation is most consistent with acute presentation with potential threat to life or bodily function.   PLAN: Will obtain labs, chest x-ray.  Will obtain coags, type and screen, reports of GI bleed.  Will give IV fluids.   MEDICATIONS GIVEN IN ED: Medications  sodium chloride  0.9 % bolus 1,000 mL (0 mLs Intravenous Stopped 07/17/23 0406)     ED COURSE: Hemoglobin is 15.3.  Normal platelets and coags.  Creatinine minimally elevated which is his baseline.  Troponin x 2 negative.  Chest x-ray reviewed and interpreted by myself and radiologist and shows no acute abnormality.  Patient feeling better after IV fluids and able to eat and drink here.  He is not orthostatic.  Blood sugar normal.  No evidence seen on cardiac monitoring.  No signs of rectal  bleeding on exam and he is guaiac negative.  Recommended increase fluid intake, rest and follow-up with his PCP.  I feel he is safe for discharge.  At this time, I do not feel there is any life-threatening condition present. I reviewed all nursing notes, vitals, pertinent previous records.  All lab and urine results, EKGs, imaging ordered have been independently reviewed and interpreted by myself.  I reviewed all available radiology reports from any imaging ordered this visit.  Based on my assessment, I feel the patient is safe to be discharged home without further emergent workup and can continue workup as an outpatient as needed. Discussed all findings, treatment plan as well as usual and customary return precautions.  They verbalize understanding and are comfortable with this plan.  Outpatient follow-up has been provided as needed.  All questions have been answered.    CONSULTS: Admission considered given patient's significant cardiac history but given he is feeling better with reassuring workup, I feel he is safe for outpatient management.   OUTSIDE RECORDS REVIEWED: Reviewed last cardiology notes in April 2025.       FINAL CLINICAL IMPRESSION(S) / ED DIAGNOSES   Final diagnoses:  Near syncope     Rx / DC Orders   ED Discharge Orders     None        Note:  This document was prepared using Dragon voice recognition software and may include unintentional dictation errors.  Daleah Coulson, Clover Dao, DO 07/17/23 [phone]

## 2023-07-21 ENCOUNTER — Telehealth: Payer: Self-pay

## 2023-07-21 NOTE — Transitions of Care (Post Inpatient/ED Visit) (Signed)
 07/21/2023  Name: Logan Crawford MRN: [id number] DOB: 1975-11-10  Today's TOC FU Call Status:   Patient's Name and Date of Birth confirmed.  Transition Care Management Follow-up Telephone Call Date of Discharge: 07/17/23 Discharge Facility: [facility]) Type of Discharge: Emergency Department Reason for ED Visit: Other: How have you been since you were released from the hospital?: Better Any questions or concerns?: No  Items Reviewed: Did you receive and understand the discharge instructions provided?: No Medications obtained,verified, and reconciled?: Yes (Medications Reviewed) Any new allergies since your discharge?: No Dietary orders reviewed?: No Do you have support at home?: Yes People in Home [RPT]: other relative(s) Name of Support/Comfort Primary Source: uncle  Medications Reviewed Today: Medications Reviewed Today     Reviewed by Angelita Bares, CMA (Certified Medical Assistant) on 07/21/23 at 1410  Med List Status: <None>   Medication Order Taking? Sig Documenting Provider Last Dose Status Informant  aspirin  EC 81 MG tablet [id number] Yes Take 1 tablet (81 mg total) by mouth daily. Swallow whole. Roark Chick, PA-C Taking Active Self  clopidogrel  (PLAVIX ) 75 MG tablet [id number] Yes Take 1 tablet (75 mg total) by mouth daily. Roark Chick, PA-C Taking Active   doxycycline  (VIBRAMYCIN ) 100 MG capsule [id number] No Take 1 capsule (100 mg total) by mouth 2 (two) times daily.  Patient not taking: Reported on 07/21/2023   Oda Bence, PA-C Not Taking Active   empagliflozin  (JARDIANCE ) 10 MG TABS tablet [id number] Yes Take 1 tablet (10 mg total) by mouth daily. Bensimhon, Rheta Celestine, MD Taking Active   famotidine  (PEPCID ) 20 MG tablet [id number]  Take 1 tablet (20 mg total) by mouth at bedtime as needed for heartburn or indigestion.  Patient taking differently: Take 20 mg by mouth at bedtime.   Paseda, Folashade R, FNP  Active Self   Fluticasone-Umeclidin-Vilant (TRELEGY ELLIPTA ) 100-62.5-25 MCG/ACT AEPB [id number] Yes Inhale 1 puff into the lungs daily. Vergia Glasgow, MD Taking Active Self  furosemide  (LASIX ) 40 MG tablet [id number] Yes Take 1 tablet (40 mg total) by mouth daily. Roark Chick, PA-C Taking Active   isosorbide  mononitrate (IMDUR ) 30 MG 24 hr tablet [id number] Yes Take 1 tablet (30 mg total) by mouth daily. Bensimhon, Rheta Celestine, MD Taking Active   losartan  (COZAAR ) 50 MG tablet [id number] Yes Take 1 tablet (50 mg total) by mouth daily. Roark Chick, PA-C Taking Active   metoprolol  succinate (TOPROL -XL) 25 MG 24 hr tablet [id number] Yes TAKE 2 TABLETS BY MOUTH DAILY. STOP TAKING METOPROLOL  IF YOU CONTINUE TO USE COCAINE Bensimhon, Rheta Celestine, MD Taking Active   nitroGLYCERIN  (NITROSTAT ) 0.4 MG SL tablet [id number] Yes Place 1 tablet (0.4 mg total) under the tongue every 5 (five) minutes as needed for chest pain (if chest pain not resolved after 3 doses, please call 911). Sammy Crisp, MD Taking Active Self  Kennedy Peabody [id number]  Pt uses a bi-pap nightly [provider]  Active Self  pantoprazole  (PROTONIX ) 40 MG tablet [id number] Yes Take 1 tablet by mouth once daily Paseda, Folashade R, FNP Taking Active   rosuvastatin  (CRESTOR ) 40 MG tablet [id number] Yes Take 1 tablet by mouth once daily Roark Chick, PA-C Taking Active   spironolactone  (ALDACTONE ) 25 MG tablet [id number] Yes Take 1 tablet (25 mg total) by mouth daily. Bensimhon, Rheta Celestine, MD Taking Active   triamcinolone  (KENALOG ) 0.025 % cream [id number] No APPLY TOPICALLY TWICE DAILY  Patient not taking: Reported on 07/21/2023   Paseda, Folashade  R, FNP Not Taking Active   VENTOLIN  HFA 108 (90 Base) MCG/ACT inhaler [id number] Yes Inhale 1-2 puffs into the lungs every 6 (six) hours as needed for wheezing or shortness of breath. [provider] Taking Active Self            Home Care and Equipment/Supplies: Were Home Health Services Ordered?:  No Any new equipment or medical supplies ordered?: No  Functional Questionnaire: Do you need assistance with bathing/showering or dressing?: No Do you need assistance with meal preparation?: No Do you need assistance with eating?: No Do you have difficulty maintaining continence: No Do you need assistance with getting out of bed/getting out of a chair/moving?: No Do you have difficulty managing or taking your medications?: No  Follow up appointments reviewed: PCP Follow-up appointment confirmed?: Yes Date of PCP follow-up appointment?: 07/29/23 Follow-up Provider: [facility] Follow-up appointment confirmed?: NA Do you need transportation to your follow-up appointment?: No Do you understand care options if your condition(s) worsen?: Yes-patient verbalized understanding  SDOH Interventions Today    Flowsheet Row Most Recent Value  SDOH Interventions   Food Insecurity Interventions Intervention Not Indicated  Housing Interventions Intervention Not Indicated  Transportation Interventions Intervention Not Indicated       SIGNATURE Maryela Tapper, RMA

## 2023-07-28 ENCOUNTER — Other Ambulatory Visit: Payer: Self-pay | Admitting: *Deleted

## 2023-07-29 ENCOUNTER — Encounter: Payer: Self-pay | Admitting: Nurse Practitioner

## 2023-07-29 ENCOUNTER — Ambulatory Visit (INDEPENDENT_AMBULATORY_CARE_PROVIDER_SITE_OTHER): Payer: Self-pay | Admitting: Nurse Practitioner

## 2023-07-29 VITALS — BP 126/75 | HR 81 | Temp 97.0°F | Wt 242.0 lb

## 2023-07-29 DIAGNOSIS — I5032 Chronic diastolic (congestive) heart failure: Secondary | ICD-10-CM | POA: Diagnosis not present

## 2023-07-29 DIAGNOSIS — K5900 Constipation, unspecified: Secondary | ICD-10-CM | POA: Diagnosis not present

## 2023-07-29 DIAGNOSIS — K648 Other hemorrhoids: Secondary | ICD-10-CM

## 2023-07-29 DIAGNOSIS — Z09 Encounter for follow-up examination after completed treatment for conditions other than malignant neoplasm: Secondary | ICD-10-CM | POA: Insufficient documentation

## 2023-07-29 DIAGNOSIS — F17210 Nicotine dependence, cigarettes, uncomplicated: Secondary | ICD-10-CM | POA: Diagnosis not present

## 2023-07-29 DIAGNOSIS — Z720 Tobacco use: Secondary | ICD-10-CM

## 2023-07-29 DIAGNOSIS — R55 Syncope and collapse: Secondary | ICD-10-CM | POA: Diagnosis not present

## 2023-07-29 DIAGNOSIS — E782 Mixed hyperlipidemia: Secondary | ICD-10-CM

## 2023-07-29 DIAGNOSIS — I2510 Atherosclerotic heart disease of native coronary artery without angina pectoris: Secondary | ICD-10-CM

## 2023-07-29 MED ORDER — POLYETHYLENE GLYCOL 3350 17 G PO PACK: 17.0000 g | PACK | Freq: Every day | ORAL | 1 refills | Status: AC | PRN

## 2023-07-29 MED ORDER — HYDROCORTISONE ACETATE 25 MG RE SUPP: 25.0000 mg | Freq: Two times a day (BID) | RECTAL | 0 refills | Status: AC

## 2023-07-29 NOTE — Assessment & Plan Note (Signed)
 Lab Results  Component Value Date   CHOL 138 05/02/2022   HDL 26 (L) 05/02/2022   LDLCALC 46 05/02/2022   TRIG 331 (H) 05/02/2022   CHOLHDL 5.3 05/02/2022  On rosuvastatin  40 mg daily Checking labs

## 2023-07-29 NOTE — Assessment & Plan Note (Signed)
 Hospital chart reviewed, including discharge summary Medications reconciled and reviewed with the patient in detail

## 2023-07-29 NOTE — Assessment & Plan Note (Signed)
 Continue metoprolol  25 mg daily, spironolactone  25 mg daily, furosemide  40 mg daily, isosorbide  mononitrate 30 mg daily Patient referred to the clinical pharmacist for medication assistance for Jardiance  Encouraged to maintain close follow-up with cardiology DASH diet and commitment to daily physical activity for a minimum of 30 minutes discussed and encouraged, as a part of hypertension management. The importance of attaining a healthy weight is also discussed.     07/29/2023    9:48 AM 07/17/2023    4:06 AM 07/17/2023    2:30 AM 07/17/2023    2:00 AM 07/17/2023   12:15 AM 06/16/2023    4:20 PM 06/01/2023   11:07 AM  BP/Weight  Systolic BP 126 135 142 128 133 111 107  Diastolic BP 75 89 92 93 81 72 77  Wt. (Lbs) 242     238.4 238  BMI 32.82 kg/m2     32.33 kg/m2 32.28 kg/m2

## 2023-07-29 NOTE — Assessment & Plan Note (Signed)
 Continue Plavix  75 mg daily, as an 81 mg daily, Crestor  40 mg daily checking lipid panel

## 2023-07-29 NOTE — Patient Instructions (Addendum)
 Around 3 times per week, check your blood pressure 2 times per day. once in the morning and once in the evening. The readings should be at least one minute apart. Write down these values and bring them to your next nurse visit/appointment.  When you check your BP, make sure you have been doing something calm/relaxing 5 minutes prior to checking. Both feet should be flat on the floor and you should be sitting. Use your left arm and make sure it is in a relaxed position (on a table), and that the cuff is at the approximate level/height of your heart.  Blood pressure goal is less than 130/80    . Constipation, unspecified constipation type  - polyethylene glycol (MIRALAX ) 17 g packet; Take 17 g by mouth daily as needed.  Dispense: 30 each; Refill: 1  Internal hemorrhoid  - Ambulatory referral to Gastroenterology - hydrocortisone  (ANUSOL -HC) 25 MG suppository; Place 1 suppository (25 mg total) rectally 2 (two) times daily.  Dispense: 12 suppository; Refill: 0    It is important that you exercise regularly at least 30 minutes 5 times a week as tolerated  Think about what you will eat, plan ahead. Choose " clean, green, fresh or frozen" over canned, processed or packaged foods which are more sugary, salty and fatty. 70 to 75% of food eaten should be vegetables and fruit. Three meals at set times with snacks allowed between meals, but they must be fruit or vegetables. Aim to eat over a 12 hour period , example 7 am to 7 pm, and STOP after  your last meal of the day. Drink water,generally about 64 ounces per day, no other drink is as healthy. Fruit juice is best enjoyed in a healthy way, by EATING the fruit.  Thanks for choosing [facility] we consider it a privelige to serve you.

## 2023-07-29 NOTE — Assessment & Plan Note (Signed)
 Doing well today currently denies dizziness

## 2023-07-29 NOTE — Assessment & Plan Note (Signed)
 Smokes about 0.5 pack/day  Asked about quitting: confirms that he currently smokes cigarettes Advise to quit smoking: Educated about QUITTING to reduce the risk of cancer, cardio and cerebrovascular disease. Assess willingness: Unwilling to quit at this time, but is working on cutting back. Assist with counseling and pharmacotherapy: Counseled for 5 minutes and literature provided. Arrange for follow up: follow up in 4 months and continue to offer help.

## 2023-07-29 NOTE — Assessment & Plan Note (Signed)
 Increase intake of fiber, maintain hydration - polyethylene glycol (MIRALAX ) 17 g packet; Take 17 g by mouth daily as needed.  Dispense: 30 each; Refill: 1 - Ambulatory referral to Gastroenterology

## 2023-07-29 NOTE — Assessment & Plan Note (Signed)
  Increase intake of fiber drink at least 64 ounces of water daily to maintain hydration Interested in hemorrhoid banding-referral to Gastroenterology  - polyethylene glycol (MIRALAX ) 17 g packet; Take 17 g by mouth daily as needed for constipation  - hydrocortisone  (ANUSOL -HC) 25 MG suppository; Place 1 suppository (25 mg total) rectally 2 (two) times daily for hemorrhoids

## 2023-07-29 NOTE — Progress Notes (Signed)
 Established Patient Office Visit  Subjective:  Patient ID: Logan Crawford, male    DOB: [birth year]  Age: 48 y.o. MRN: [id number]  CC:  Chief Complaint  Patient presents with   Hospitalization Follow-up    Per pt he feels better    HPI Logan Crawford is a 48 y.o. male  has a past medical history of (HFpEF) heart failure with preserved ejection fraction (HCC), Anginal pain (HCC), Aortic atherosclerosis (HCC), Bilateral pleural effusion (02/20/2022), CAD (coronary artery disease), CKD (chronic kidney disease), stage III (HCC), Complete heart block (HCC) (02/19/2022), Complication of anesthesia (02/19/2022), COPD (chronic obstructive pulmonary disease) (HCC), Genital warts, GERD (gastroesophageal reflux disease), Hiatal hernia, History of 2019 novel coronavirus disease (COVID-19) (11/25/2019), History of left atrial appendage closure (02/19/2022), Hyperlipidemia, Hypertension, Ischemic cardiomyopathy, Leukocytosis, Long term current use of clopidogrel , Long-term use of aspirin  therapy, 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  Patient presents for hospitalization follow-up  Patient was at the emergency department on 07/17/2023 for complaints of near syncope, labs obtained with normal no acute abnormality noted on chest x-ray he was not orthostatic, he was recommended to increase fluid intake rest and follow-up with PCP.  Patient stated that he is doing well today. Currently on metoprolol  25 mg daily instead of 50 mg daily, isosorbide  mononitrate 30 mg daily, spironolactone  25 mg daily, furosemide  40 mg daily.  He has been out of Jardiance  for months. he stated  that he had not been taking losartan  for months and when  he realized it , he started taking the medication, it was around the time that he had a syncope episode at home with low blood pressure.  He has stopped taking losartan  since his visits to the emergency department on 07/17/2023, taking other medications daily but has not taken his medications today, takes them around lunch time .  Has a blood pressure monitor at home but does not monitor his blood pressure.  He currently denies chest pain, shortness of breath, edema, dizziness.  Goes to the gym 3 days a week and has been following a low-salt diet  Constipation/hemorrhoids.  Internal hemorrhoids noted on his last colonoscopy, he struggles with constipation sometimes does not move his bowel in 3 days, and when he does move his bowel he experiences rectal bleeding.  Takes OTC stool softener as needed.  He is interested in hemorrhoid banding.       Past Medical History:  Diagnosis Date   (HFpEF) heart failure with preserved ejection fraction (HCC)    a.) TTE 11/26/19: EF 50-55, G1DD; b.) TTE 01/24/20: EF 50, LVH, mid-apical antsep HK, G1DD; c.) TTE 05/18/21: EF 50-55, antsep/sep/apical HK, LVH, mild MR, AoV calc; d.) TTE 07/16/21: EF 60-65, LVH, G2DD, mild MD; e.) TTE 02/18/22: EF 55, sep/inf HK, LVH, G1DD, mild MR; f.) TTE 03/19/22: EF 65-70, antsep/periapical HK, LVH, G1DD, mild MR, AoV calc; g.) TTE 02/12/2023: EF 55-60, LVH, G2DD, mild MR   Anginal pain (HCC)    Aortic atherosclerosis (HCC)    Bilateral pleural effusion 02/20/2022   a.) s/p CABG and LAA closure (L > R) --> progressive during admission --> ultimately required LEFT thoracentesis on 03/10/2022 (750 cc serous yield)   CAD (coronary artery disease)    a.)PCI 01/24/20: 90 mLAD (2.75x22  Res Onyx); b.)PCI 05/20/21: 85 mLAD (2.75x24 + 2.5x8 Onyx); c.)LHC 11/18/21: 80 ISR + 60 mLAD - CVTS vs stent POBA; d.)LHC 02/13/22: 70 ISR p-mLAD, 80/50 ISR mLAD, 99 ISR dLAD, 80 jailed D2, diff HG RCA  Dz; e.)3v CABG 02/19/22; f.)LHC 02/03/23: 30 LM, 70 p-mLAD, 80/70 mLAD, 99 dLAD, 80 D2, 70/70 pRCA, 70/95 mRCA, 40 dRCA, 60 RV branch, 60 RPDA, 100 SVG-LAD - Rx mgmt   CKD (chronic kidney disease), stage III (HCC)    Complete heart block (HCC) 02/19/2022   a.) during CABG/LAA closure --> hyperkalemic CHB in OR before heart surgery had functionally begun (not on bypass yet); etiology unknown; ?? due to medication administration or contaminant given fairly normal preop renal function (Cr 1.3). Persisted post-bypass event with extensive dialysis equivalent via CPB pump --> Tx'd with dantrolene    Complication of anesthesia 02/19/2022   a.) perioperative HYPERkalemia - concern for atypical MH initially - Tx'd intraoperaitvely with dantrolene . Preop K+ 4.6 and CR 1.3 --> K+ peaked at 7.8 intra/postop --> rec'd furosemide  + insulin  + calcium  + extended CBP (remained on pump at least 1 hour after procedure). Consult with MH society determined "unlikely" MH (see Argwala, MD note 02/22/2022). No confirmatory CHCT testing.   COPD (chronic obstructive pulmonary disease) (HCC)    Genital warts    GERD (gastroesophageal reflux disease)    Hiatal hernia    History of 2019 novel coronavirus disease (COVID-19) 11/25/2019   History of left atrial appendage closure 02/19/2022   a.) performed simultaneously with cardiac revascularization (CABG) procedure on 02/19/2022   Hyperlipidemia    Hypertension    Ischemic cardiomyopathy    a.) TTE 11/26/19: EF 50-55; b.)TTE 01/24/20: EF 50; c.)LHC 01/24/20: EF 45-50; d.)MV 11/29/20: EF 43; e.)TTE 05/18/21: EF 50-55; f.)LHC 05/20/21: EF 50-55; g.)TTE 07/16/21: EF 60-65; h.) TTE 11/16/21: EF 60-65; i.) LHC 11/18/21: EF 55-65; j.) TTE 02/12/22: EF 60-65; k.) TTE 02/18/22: EF 55; l.) TTE 03/19/22: EF 65-70; m.) TTE 05/01/22: EF 55; n.) MV 01/27/23: EF 45-50; o.) TTE 02/12/2023: EF 55-60   Leukocytosis    Long term current use of clopidogrel     Long-term use of aspirin  therapy     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   a.) surgical PCR (+) 02/15/2022 prior to CABG   NSTEMI (non-ST elevated myocardial infarction) (HCC) 01/23/2020   a.) troponins were trended: 3520 --> 2990 ng/L; b.) LHC/PCI 01/24/2020: 60% o-pRCA, 40% p-mRCA, 90% mLAD (2.75 x 22 mm Resolute Onyx DES), 40% dLAD   NSTEMI (non-ST elevated myocardial infarction) (HCC) 05/18/2021   a.) troponins were trended: 101 --> 110 --> 175 ng/L; b.) LHC/PCI 05/20/2021: 40% dLAD, 40% p-mRCA, 50% o-pRCA, 40% RPDA, 30% oLM, 85% mLAD with distal edge dissection (overlapping 2.75 x 34 mm and 2.5 x 8 mm Onyx Frontier DES), 60% D2   OSA on CPAP    Polysubstance abuse (HCC)    a.) cocaine, THC, opiates, tobacco   Postoperative cardiogenic shock (HCC) 02/19/2022   a.) required vasopressor/ionotropic agent support + IABP   Recurrent cellulitis of thigh    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 Consult with MH society determined MH unlikely (see Argwala, MD note 02/22/2022); b.) c/b postop cardiogenic shock req vasopressor/inotrophic support + IABP; c.) c/b dev of BILATERAL pleural effusions (L > R) req LEFT thoracentesis (750 cc yield)   Scrotal lesion (sebaceous cyst)  Tobacco use 11/25/2019    Past Surgical History:  Procedure Laterality Date   CORONARY ARTERY BYPASS GRAFT N/A 02/19/2022   Procedure: CORONARY ARTERY BYPASS GRAFTING (CABG) X THREE, USING ENDOSCOPICALLY HARVESTED RIGHT GREATER SAPHENOUS VEIN  AND LEFT ATRIAL APPENDAGE CLIPPING;  Surgeon: Eleanora Grew, MD;  Location: [facility] OR;  Service: Open Heart Surgery;  Laterality: N/A;   CORONARY STENT INTERVENTION N/A 01/24/2020   Procedure: CORONARY STENT INTERVENTION;  Surgeon: Wenona Hamilton, MD;  Location: [facility];  Service: Cardiovascular;  Laterality: N/A;   CORONARY STENT INTERVENTION N/A 05/20/2021   Procedure:  CORONARY STENT INTERVENTION;  Surgeon: Wenona Hamilton, MD;  Location: [facility];  Service: Cardiovascular;  Laterality: N/A;   CORONARY ULTRASOUND/IVUS N/A 05/20/2021   Procedure: Intravascular Ultrasound/IVUS;  Surgeon: Wenona Hamilton, MD;  Location: [facility];  Service: Cardiovascular;  Laterality: N/A;   IRRIGATION AND DEBRIDEMENT KNEE Left    LEFT ATRIAL APPENDAGE OCCLUSION Left 02/19/2022   Procedure: LEFT ATRIAL APPENDAGE OCCLUSION; Location: [facility]; Surgeon: Naomi Bach, MD   LEFT HEART CATH AND CORONARY ANGIOGRAPHY N/A 01/24/2020   Procedure: LEFT HEART CATH AND CORONARY ANGIOGRAPHY;  Surgeon: Wenona Hamilton, MD;  Location: [facility];  Service: Cardiovascular;  Laterality: N/A;   LEFT HEART CATH AND CORONARY ANGIOGRAPHY N/A 11/18/2021   Procedure: LEFT HEART CATH AND CORONARY ANGIOGRAPHY;  Surgeon: Wenona Hamilton, MD;  Location: [facility];  Service: Cardiovascular;  Laterality: N/A;   LEFT HEART CATH AND CORONARY ANGIOGRAPHY N/A 02/13/2022   Procedure: LEFT HEART CATH AND CORONARY ANGIOGRAPHY;  Surgeon: Arleen Lacer, MD;  Location: [facility];  Service: Cardiovascular;  Laterality: N/A;   LEFT HEART CATH AND CORS/GRAFTS ANGIOGRAPHY N/A 05/20/2021   Procedure: LEFT HEART CATH AND CORS/GRAFTS ANGIOGRAPHY;  Surgeon: Wenona Hamilton, MD;  Location: [facility];  Service: Cardiovascular;  Laterality: N/A;   LEFT HEART CATH AND CORS/GRAFTS ANGIOGRAPHY N/A 02/03/2023   Procedure: LEFT HEART CATH AND CORS/GRAFTS ANGIOGRAPHY;  Surgeon: Sammy Crisp, MD;  Location: [facility];  Service: Cardiovascular;  Laterality: N/A;   SCROTAL EXPLORATION N/A 03/06/2023   Procedure: EXCISION OF SCROTUM LESION, SEBACEOUS CYST;  Surgeon: Lawerence Pressman, MD;  Location: [facility];  Service: Urology;  Laterality: N/A;   TYMPANOSTOMY TUBE PLACEMENT Bilateral     Family History  Problem Relation Age of Onset    Coronary artery disease Mother    Arrhythmia Mother        Pacemaker placed in 2021   Heart failure Mother    Breast cancer Mother    Coronary artery disease Father    Colon cancer Father    Colon polyps Father     Social History   Socioeconomic History   Marital status: Single    Spouse name: Not on file   Number of children: 3   Years of education: Not on file   Highest education level: Not on file  Occupational History   Not on file  Tobacco Use   Smoking status: Every Day    Current packs/day: 1.00    Average packs/day: 1 pack/day for 10.5 years (10.5 ttl pk-yrs)    Types: Cigarettes    Start date: 02/12/2013    Passive exposure: Past   Smokeless tobacco: Never   Tobacco comments:    2-3 cigarettes daily- 08/06/2022        1/2 pack daily as of 01/2023  Vaping Use  Vaping status: Never Used  Substance and Sexual Activity   Alcohol use: Yes    Alcohol/week: 1.0 standard drink of alcohol    Types: 1 Shots of liquor per week    Comment: socially   Drug use: Yes    Comment: states takes gummies occasionally   Sexual activity: Yes  Other Topics Concern   Not on file  Social History Narrative   Lives with his uncle    Social Drivers of Corporate investment banker Strain: Not on file  Food Insecurity: No Food Insecurity (07/21/2023)   Hunger Vital Sign    Worried About Running Out of Food in the Last Year: Never true    Ran Out of Food in the Last Year: Never true  Transportation Needs: No Transportation Needs (07/21/2023)   PRAPARE - Administrator, Civil Service (Medical): No    Lack of Transportation (Non-Medical): No  Physical Activity: Not on file  Stress: Not on file  Social Connections: Unknown (08/05/2022)   Received [facility]   Social Network    Social Network: Not on file  Intimate Partner Violence: Unknown (08/05/2022)   Received from [facility]   HITS    Physically Hurt: Not on file    Insult  or Talk Down To: Not on file    Threaten Physical Harm: Not on file    Scream or Curse: Not on file    Outpatient Medications Prior to Visit  Medication Sig Dispense Refill   aspirin  EC 81 MG tablet Take 1 tablet (81 mg total) by mouth daily. Swallow whole. 90 tablet 3   clopidogrel  (PLAVIX ) 75 MG tablet Take 1 tablet (75 mg total) by mouth daily. 90 tablet 3   famotidine  (PEPCID ) 20 MG tablet Take 1 tablet (20 mg total) by mouth at bedtime as needed for heartburn or indigestion. (Patient taking differently: Take 20 mg by mouth at bedtime.) 60 tablet 0   Fluticasone-Umeclidin-Vilant (TRELEGY ELLIPTA ) 100-62.5-25 MCG/ACT AEPB Inhale 1 puff into the lungs daily. 28 each 0   furosemide  (LASIX ) 40 MG tablet Take 1 tablet (40 mg total) by mouth daily. 90 tablet 3   isosorbide  mononitrate (IMDUR ) 30 MG 24 hr tablet Take 1 tablet (30 mg total) by mouth daily. 30 tablet 11   metoprolol  succinate (TOPROL -XL) 25 MG 24 hr tablet TAKE 2 TABLETS BY MOUTH DAILY. STOP TAKING METOPROLOL  IF YOU CONTINUE TO USE COCAINE 180 tablet 3   nitroGLYCERIN  (NITROSTAT ) 0.4 MG SL tablet Place 1 tablet (0.4 mg total) under the tongue every 5 (five) minutes as needed for chest pain (if chest pain not resolved after 3 doses, please call 911). 25 tablet PRN   NON FORMULARY Pt uses a bi-pap nightly     pantoprazole  (PROTONIX ) 40 MG tablet Take 1 tablet by mouth once daily 90 tablet 0   rosuvastatin  (CRESTOR ) 40 MG tablet Take 1 tablet by mouth once daily 90 tablet 0   spironolactone  (ALDACTONE ) 25 MG tablet Take 1 tablet (25 mg total) by mouth daily. 90 tablet 3   VENTOLIN  HFA 108 (90 Base) MCG/ACT inhaler Inhale 1-2 puffs into the lungs every 6 (six) hours as needed for wheezing or shortness of breath.     empagliflozin  (JARDIANCE ) 10 MG TABS tablet Take 1 tablet (10 mg total) by mouth daily. (Patient not taking: Reported on 07/29/2023) 90 tablet 3   losartan  (COZAAR ) 50 MG tablet Take 1 tablet (50 mg total) by mouth daily.  (  Patient not taking: Reported on 07/29/2023) 90 tablet 3   triamcinolone  (KENALOG ) 0.025 % cream APPLY TOPICALLY TWICE DAILY (Patient not taking: Reported on 07/29/2023) 30 g 0   doxycycline  (VIBRAMYCIN ) 100 MG capsule Take 1 capsule (100 mg total) by mouth 2 (two) times daily. (Patient not taking: Reported on 07/29/2023) 20 capsule 0   No facility-administered medications prior to visit.    Allergies  Allergen Reactions   Bee Pollen     Patient states he is not allergic to bees but has seasonal allergies to pollen    ROS Review of Systems  Constitutional:  Negative for appetite change, chills, fatigue and fever.  HENT:  Negative for congestion, postnasal drip, rhinorrhea and sneezing.   Respiratory:  Negative for cough, shortness of breath and wheezing.   Cardiovascular:  Negative for chest pain, palpitations and leg swelling.  Gastrointestinal:  Negative for abdominal pain, constipation, nausea and vomiting.  Genitourinary:  Negative for difficulty urinating, dysuria, flank pain and frequency.  Musculoskeletal:  Negative for arthralgias, back pain, joint swelling and myalgias.  Skin:  Negative for color change, pallor, rash and wound.  Neurological:  Negative for dizziness, facial asymmetry, weakness, numbness and headaches.  Psychiatric/Behavioral:  Negative for behavioral problems, confusion, self-injury and suicidal ideas.       Objective:     Physical Exam Vitals and nursing note reviewed.  Constitutional:      General: He is not in acute distress.    Appearance: Normal appearance. He is obese. He is not ill-appearing, toxic-appearing or diaphoretic.  Eyes:     General: No scleral icterus.       Right eye: No discharge.        Left eye: No discharge.     Extraocular Movements: Extraocular movements intact.     Conjunctiva/sclera: Conjunctivae normal.  Cardiovascular:     Rate and Rhythm: Normal rate and regular rhythm.     Pulses: Normal pulses.     Heart sounds: Normal  heart sounds. No murmur heard.    No friction rub. No gallop.  Pulmonary:     Effort: Pulmonary effort is normal. No respiratory distress.     Breath sounds: Normal breath sounds. No stridor. No wheezing, rhonchi or rales.  Chest:     Chest wall: No tenderness.  Abdominal:     General: There is no distension.     Palpations: Abdomen is soft.     Tenderness: There is no abdominal tenderness. There is no right CVA tenderness, left CVA tenderness or guarding.  Musculoskeletal:        General: No swelling, tenderness, deformity or signs of injury.     Right lower leg: No edema.     Left lower leg: No edema.  Skin:    General: Skin is warm and dry.     Capillary Refill: Capillary refill takes less than 2 seconds.     Coloration: Skin is not jaundiced or pale.     Findings: No bruising, erythema or lesion.  Neurological:     Mental Status: He is alert and oriented to person, place, and time.     Motor: No weakness.     Coordination: Coordination normal.     Gait: Gait normal.  Psychiatric:        Mood and Affect: Mood normal.        Behavior: Behavior normal.        Thought Content: Thought content normal.        Judgment: Judgment normal.  BP 126/75   Pulse 81   Temp (!) 97 F (36.1 C)   Wt 242 lb (109.8 kg)   SpO2 96%   BMI 32.82 kg/m  Wt Readings from Last 3 Encounters:  07/29/23 242 lb (109.8 kg)  06/16/23 238 lb 6.4 oz (108.1 kg)  06/01/23 238 lb (108 kg)    No results found for: "TSH" Lab Results  Component Value Date   WBC 8.1 07/17/2023   HGB 15.3 07/17/2023   HCT 46.8 07/17/2023   MCV 81.7 07/17/2023   PLT 305 07/17/2023   Lab Results  Component Value Date   NA 140 07/17/2023   K 3.6 07/17/2023   CO2 25 07/17/2023   GLUCOSE 110 (H) 07/17/2023   BUN 7 07/17/2023   CREATININE 1.29 (H) 07/17/2023   BILITOT 0.7 07/17/2023   ALKPHOS 82 07/17/2023   AST 30 07/17/2023   ALT 25 07/17/2023   PROT 7.4 07/17/2023   ALBUMIN  3.8 07/17/2023   CALCIUM  9.0  07/17/2023   ANIONGAP 10 07/17/2023   EGFR 61 01/21/2023   Lab Results  Component Value Date   CHOL 138 05/02/2022   Lab Results  Component Value Date   HDL 26 (L) 05/02/2022   Lab Results  Component Value Date   LDLCALC 46 05/02/2022   Lab Results  Component Value Date   TRIG 331 (H) 05/02/2022   Lab Results  Component Value Date   CHOLHDL 5.3 05/02/2022   Lab Results  Component Value Date   HGBA1C 5.2 04/14/2022      Assessment & Plan:   Problem List Items Addressed This Visit       Cardiovascular and Mediastinum   CAD (coronary artery disease)   Continue Plavix  75 mg daily, as an 81 mg daily, Crestor  40 mg daily checking lipid panel      Chronic diastolic CHF (congestive heart failure) (HCC)   Continue metoprolol  25 mg daily, spironolactone  25 mg daily, furosemide  40 mg daily, isosorbide  mononitrate 30 mg daily Patient referred to the clinical pharmacist for medication assistance for Jardiance  Encouraged to maintain close follow-up with cardiology DASH diet and commitment to daily physical activity for a minimum of 30 minutes discussed and encouraged, as a part of hypertension management. The importance of attaining a healthy weight is also discussed.     07/29/2023    9:48 AM 07/17/2023    4:06 AM 07/17/2023    2:30 AM 07/17/2023    2:00 AM 07/17/2023   12:15 AM 06/16/2023    4:20 PM 06/01/2023   11:07 AM  BP/Weight  Systolic BP 126 135 142 128 133 111 107  Diastolic BP 75 89 92 93 81 72 77  Wt. (Lbs) 242     238.4 238  BMI 32.82 kg/m2     32.33 kg/m2 32.28 kg/m2           Relevant Orders   AMB Referral VBCI Care Management   Internal hemorrhoid    Increase intake of fiber drink at least 64 ounces of water daily to maintain hydration Interested in hemorrhoid banding-referral to Gastroenterology  - polyethylene glycol (MIRALAX ) 17 g packet; Take 17 g by mouth daily as needed for constipation  - hydrocortisone  (ANUSOL -HC) 25 MG suppository; Place 1  suppository (25 mg total) rectally 2 (two) times daily for hemorrhoids        Relevant Medications   hydrocortisone  (ANUSOL -HC) 25 MG suppository   Other Relevant Orders   Ambulatory referral to Gastroenterology   Syncope, near -  Primary   Doing well today currently denies dizziness        Other   Tobacco use   Smokes about 0.5 pack/day  Asked about quitting: confirms that he currently smokes cigarettes Advise to quit smoking: Educated about QUITTING to reduce the risk of cancer, cardio and cerebrovascular disease. Assess willingness: Unwilling to quit at this time, but is working on cutting back. Assist with counseling and pharmacotherapy: Counseled for 5 minutes and literature provided. Arrange for follow up: follow up in 4 months and continue to offer help.       Hyperlipidemia   Lab Results  Component Value Date   CHOL 138 05/02/2022   HDL 26 (L) 05/02/2022   LDLCALC 46 05/02/2022   TRIG 331 (H) 05/02/2022   CHOLHDL 5.3 05/02/2022  On rosuvastatin  40 mg daily Checking labs      Relevant Orders   Lipid panel   Constipation   Increase intake of fiber, maintain hydration - polyethylene glycol (MIRALAX ) 17 g packet; Take 17 g by mouth daily as needed.  Dispense: 30 each; Refill: 1 - Ambulatory referral to Gastroenterology       Relevant Medications   polyethylene glycol (MIRALAX ) 17 g packet   Other Relevant Orders   Ambulatory referral to Gastroenterology   Encounter for examination following treatment at hospital   Hospital chart reviewed, including discharge summary Medications reconciled and reviewed with the patient in detail        Meds ordered this encounter  Medications   polyethylene glycol (MIRALAX ) 17 g packet    Sig: Take 17 g by mouth daily as needed.    Dispense:  30 each    Refill:  1   hydrocortisone  (ANUSOL -HC) 25 MG suppository    Sig: Place 1 suppository (25 mg total) rectally 2 (two) times daily.    Dispense:  12 suppository     Refill:  0    Follow-up: No follow-ups on file.    Kynzli Rease R Shirlee Whitmire, FNP

## 2023-07-30 LAB — LIPID PANEL
Chol/HDL Ratio: 4.2 ratio (ref 0.0–5.0)
Cholesterol, Total: 150 mg/dL (ref 100–199)
HDL: 36 mg/dL — ABNORMAL LOW (ref >39)
LDL Chol Calc (NIH): 85 mg/dL (ref 0–99)
Triglycerides: 167 mg/dL — ABNORMAL HIGH (ref 0–149)
VLDL Cholesterol Cal: 29 mg/dL (ref 5–40)

## 2023-07-31 ENCOUNTER — Other Ambulatory Visit: Payer: Self-pay | Admitting: Nurse Practitioner

## 2023-07-31 ENCOUNTER — Ambulatory Visit: Payer: Self-pay | Admitting: Nurse Practitioner

## 2023-07-31 DIAGNOSIS — E782 Mixed hyperlipidemia: Secondary | ICD-10-CM

## 2023-07-31 DIAGNOSIS — Z951 Presence of aortocoronary bypass graft: Secondary | ICD-10-CM

## 2023-07-31 MED ORDER — EZETIMIBE 10 MG PO TABS: 10.0000 mg | ORAL_TABLET | Freq: Every day | ORAL | 3 refills | Status: DC

## 2023-08-11 ENCOUNTER — Telehealth: Payer: Self-pay | Admitting: *Deleted

## 2023-08-11 NOTE — Progress Notes (Unsigned)
 Care Guide Pharmacy Note  08/11/2023 Name: Logan Crawford MRN: [id number] DOB: [birth year]  Referred By: Paseda, Folashade R, FNP Reason for referral: Complex Care Management (Initial outreach to schedule referral with Pharmacy )   Logan Crawford is a 48 y.o. year old male who is a primary care patient of Paseda, Folashade R, FNP.  Logan Crawford was referred to the pharmacist for assistance related to: CHF  An unsuccessful telephone outreach was attempted today to contact the patient who was referred to the pharmacy team for assistance with medication assistance. Additional attempts will be made to contact the patient.  Logan Crawford  [facility]  [facility], [facility] Guide  Direct Dial: [phone]  Fax [phone]

## 2023-08-17 NOTE — Progress Notes (Unsigned)
 Complex Care Management Note Care Guide Note  08/17/2023 Name: Logan Crawford MRN: [id number] DOB: [birth year]   Complex Care Management Outreach Attempts: A second unsuccessful outreach was attempted today to offer the patient with information about available complex care management services.  Follow Up Plan:  Additional outreach attempts will be made to offer the patient complex care management information and services.   Encounter Outcome:  No Answer  Harlene Satterfield  [facility]  [facility] Guide  Direct Dial: [phone]  Fax [phone]

## 2023-08-18 ENCOUNTER — Telehealth: Payer: Self-pay | Admitting: *Deleted

## 2023-08-18 DIAGNOSIS — I5032 Chronic diastolic (congestive) heart failure: Secondary | ICD-10-CM

## 2023-08-18 NOTE — Progress Notes (Signed)
 Care Guide Pharmacy Note  08/18/2023 Name: [name] MRN: [id number] DOB: 1975/10/22  Referred By: Paseda, Folashade R, FNP Reason for referral: Complex Care Management (3rd Unsuccessful Initial outreach to schedule referral with Pharmacy )   Logan Crawford is a 48 y.o. year old male who is a primary care patient of Paseda, Folashade R, FNP.  Nathanael C Buckingham was referred to the pharmacist for assistance related to: CHF  A third unsuccessful telephone outreach was attempted today to contact the patient who was referred to the pharmacy team for assistance with medication management. The Population Health team is pleased to engage with this patient at any time in the future upon receipt of referral and should he/she be interested in assistance from the Population Health team.  Harlene Satterfield  [facility]  Value[facility]Based Care Institute, [facility] Guide  Direct Dial: [phone]  Fax [phone]

## 2023-08-18 NOTE — Progress Notes (Signed)
 Care Guide Pharmacy Note  08/18/2023 Name: Logan Crawford MRN: [id number] DOB: 02/18/1976  Referred By: Paseda, Folashade R, FNP Reason for referral: Complex Care Management (Incoming call to schedule referral with PharmD)   Logan Crawford is a 48 y.o. year old male who is a primary care patient of Paseda, Folashade R, FNP.  Logan Crawford was referred to the pharmacist for assistance related to: CHF  Successful contact was made with the patient to discuss pharmacy services including being ready for the pharmacist to call at least 5 minutes before the scheduled appointment time and to have medication bottles and any blood pressure readings ready for review. The patient agreed to meet with the pharmacist via telephone visit on (date/time).09/30/23 at 9:00 AM   [facility]  [facility] Guide  Direct Dial: [phone]  Fax [phone]

## 2023-08-19 ENCOUNTER — Other Ambulatory Visit: Payer: Self-pay | Admitting: Pharmacist

## 2023-08-19 ENCOUNTER — Ambulatory Visit: Payer: Self-pay | Admitting: Urology

## 2023-08-19 NOTE — Progress Notes (Addendum)
 Care Coordination Call  Received referral for medication access related to Jardiance . Patient noted to scheduler that he was retaining fluid. Contacted patient, left voicemail for patient to return my call at his convenience.   Catie [facility], PharmD, [facility] Clinical Pharmacist [phone]

## 2023-08-24 ENCOUNTER — Ambulatory Visit (INDEPENDENT_AMBULATORY_CARE_PROVIDER_SITE_OTHER): Payer: Self-pay | Admitting: Nurse Practitioner

## 2023-08-24 ENCOUNTER — Other Ambulatory Visit: Payer: Self-pay | Admitting: Physician Assistant

## 2023-08-24 ENCOUNTER — Encounter: Payer: Self-pay | Admitting: Nurse Practitioner

## 2023-08-24 VITALS — BP 120/65 | HR 101 | Temp 98.1°F | Wt 236.8 lb

## 2023-08-24 DIAGNOSIS — E785 Hyperlipidemia, unspecified: Secondary | ICD-10-CM | POA: Diagnosis not present

## 2023-08-24 DIAGNOSIS — Z720 Tobacco use: Secondary | ICD-10-CM

## 2023-08-24 DIAGNOSIS — F17210 Nicotine dependence, cigarettes, uncomplicated: Secondary | ICD-10-CM | POA: Diagnosis not present

## 2023-08-24 DIAGNOSIS — F1410 Cocaine abuse, uncomplicated: Secondary | ICD-10-CM

## 2023-08-24 DIAGNOSIS — J438 Other emphysema: Secondary | ICD-10-CM | POA: Diagnosis not present

## 2023-08-24 DIAGNOSIS — G4733 Obstructive sleep apnea (adult) (pediatric): Secondary | ICD-10-CM | POA: Insufficient documentation

## 2023-08-24 DIAGNOSIS — I5032 Chronic diastolic (congestive) heart failure: Secondary | ICD-10-CM

## 2023-08-24 DIAGNOSIS — I10 Essential (primary) hypertension: Secondary | ICD-10-CM

## 2023-08-24 MED ORDER — VENTOLIN HFA 108 (90 BASE) MCG/ACT IN AERS: 1.0000 | INHALATION_SPRAY | Freq: Four times a day (QID) | RESPIRATORY_TRACT | 3 refills | Status: AC | PRN

## 2023-08-24 MED ORDER — NICOTINE 7 MG/24HR TD PT24
7.0000 mg | MEDICATED_PATCH | Freq: Every day | TRANSDERMAL | 0 refills | Status: DC
Start: 2023-10-05 — End: 2023-11-25

## 2023-08-24 MED ORDER — NICOTINE 14 MG/24HR TD PT24: 14.0000 mg | MEDICATED_PATCH | Freq: Every day | TRANSDERMAL | 0 refills | Status: DC

## 2023-08-24 MED ORDER — TRELEGY ELLIPTA 100-62.5-25 MCG/ACT IN AEPB: 1.0000 | INHALATION_SPRAY | Freq: Every day | RESPIRATORY_TRACT | 11 refills | Status: DC

## 2023-08-24 NOTE — Assessment & Plan Note (Signed)
 Continue metoprolol  25 mg daily, spironolactone  25 mg daily, furosemide  40 mg daily isosorbide  mononitrate 30 mg daily Has been referred to the clinical pharmacist for medication assistance for Jardiance 

## 2023-08-24 NOTE — Patient Instructions (Addendum)
 1. Other emphysema (HCC) (Primary)  - Fluticasone-Umeclidin-Vilant (TRELEGY ELLIPTA ) 100-62.5-25 MCG/ACT AEPB; Inhale 1 puff into the lungs daily.  Dispense: 1 each; Refill: 11 - VENTOLIN  HFA 108 (90 Base) MCG/ACT inhaler; Inhale 1-2 puffs into the lungs every 6 (six) hours as needed for wheezing or shortness of breath.  Dispense: 18 each; Refill: 3  2. Tobacco use  - nicotine  (NICODERM CQ  - DOSED IN MG/24 HOURS) 14 mg/24hr patch; Place 1 patch (14 mg total) onto the skin daily.  Dispense: 42 patch; Refill: 0.  Apply daily for 6 weeks then nicotine  (NICODERM CQ  - DOSED IN MG/24 HR) 7 mg/24hr patch; Place 1 patch (7 mg total) onto the skin daily.  Dispense: 14 patch; Refill: 0  3. OSA (obstructive sleep apnea)   4. Hyperlipidemia, unspecified hyperlipidemia type  - Lipid panel; Future   It is important that you exercise regularly at least 30 minutes 5 times a week as tolerated  Think about what you will eat, plan ahead. Choose  clean, green, fresh or frozen over canned, processed or packaged foods which are more sugary, salty and fatty. 70 to 75% of food eaten should be vegetables and fruit. Three meals at set times with snacks allowed between meals, but they must be fruit or vegetables. Aim to eat over a 12 hour period , example 7 am to 7 pm, and STOP after  your last meal of the day. Drink water,generally about 64 ounces per day, no other drink is as healthy. Fruit juice is best enjoyed in a healthy way, by EATING the fruit.  Thanks for choosing [facility] we consider it a privelige to serve you.

## 2023-08-24 NOTE — Assessment & Plan Note (Signed)
 Uses BiPAP at bedtime Since the equipment is not functioning properly patient advised to contact the vendor

## 2023-08-24 NOTE — Assessment & Plan Note (Signed)
 Medications refilled Smoking cessation encouraged Missed last appointment with pulmonologist he plans on rescheduling the appointment   - Fluticasone-Umeclidin-Vilant (TRELEGY ELLIPTA ) 100-62.5-25 MCG/ACT AEPB; Inhale 1 puff into the lungs daily.  Dispense: 1 each; Refill: 11 - VENTOLIN  HFA 108 (90 Base) MCG/ACT inhaler; Inhale 1-2 puffs into the lungs every 6 (six) hours as needed for wheezing or shortness of breath.  Dispense: 18 each; Refill: 3  2. Tobacco use  - nicotine  (NICODERM CQ  - DOSED IN MG/24 HOURS) 14 mg/24hr patch; Place 1 patch (14 mg total) onto the skin daily.  Dispense: 42 patch; Refill: 0 - nicotine  (NICODERM CQ  - DOSED IN MG/24 HR) 7 mg/24hr patch; Place 1 patch (7 mg total) onto the skin daily.  Dispense: 14 patch; Refill: 0

## 2023-08-24 NOTE — Assessment & Plan Note (Signed)
 Lab Results  Component Value Date   CHOL 150 07/29/2023   HDL 36 (L) 07/29/2023   LDLCALC 85 07/29/2023   TRIG 167 (H) 07/29/2023   CHOLHDL 4.2 07/29/2023  LDL goal is less than 55 Checking lipid panel in 2 weeks Continue Crestor  40 mg daily and Zetia  10 mg daily Avoid fatty fried foods

## 2023-08-24 NOTE — Assessment & Plan Note (Signed)
 Continue metoprolol  25 mg daily, spironolactone  25 mg daily, furosemide  40 mg daily isosorbide  mononitrate 30 mg daily, losartan  50 mg daily Has been referred to the clinical pharmacist for medication assistance for Jardiance  DASH diet and commitment to daily physical activity for a minimum of 30 minutes discussed and encouraged, as a part of hypertension management.      08/24/2023    2:43 PM 08/24/2023    2:39 PM 07/29/2023    9:48 AM 07/17/2023    4:06 AM 07/17/2023    2:30 AM 07/17/2023    2:00 AM 07/17/2023   12:15 AM  BP/Weight  Systolic BP 120 142 126 135 142 128 133  Diastolic BP 65 91 75 89 92 93 81  Wt. (Lbs)  236.8 242      BMI  32.12 kg/m2 32.82 kg/m2

## 2023-08-24 NOTE — Progress Notes (Signed)
 Established Patient Office Visit  Subjective:  Patient ID: Logan Crawford, male    DOB: 17-Sep-1975  Age: 48 y.o. MRN: [id number]  CC:  Chief Complaint  Patient presents with   Medical Management of Chronic Issues    Patient stated that he has been SOB, and he needs a refill on his Trelegy     HPI Logan Crawford is a 48 y.o. male  has a past medical history of (HFpEF) heart failure with preserved ejection fraction (HCC), Anginal pain (HCC), Aortic atherosclerosis (HCC), Bilateral pleural effusion (02/20/2022), CAD (coronary artery disease), CKD (chronic kidney disease), stage III (HCC), Complete heart block (HCC) (02/19/2022), Complication of anesthesia (02/19/2022), COPD (chronic obstructive pulmonary disease) (HCC), Genital warts, GERD (gastroesophageal reflux disease), Hiatal hernia, History of 2019 novel coronavirus disease (COVID-19) (11/25/2019), History of left atrial appendage closure (02/19/2022), Hyperlipidemia, Hypertension, Ischemic cardiomyopathy, Leukocytosis, Long term current use of clopidogrel , Long-term use of aspirin  therapy, 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  Patient presents for follow-up for his chronic medical conditions  Hyperlipidemia.  Currently on Crestor  40 mg daily recently started on Zetia  10 mg daily.   Emphysema.  Smokes half pack of cigarettes daily on albuterol  inhaler and Trelegy.  He has been out of Trelegy for almost a week.  Patient complains of shortness of breath, wheezing he denies cough, fever, chills  Obstructive sleep apnea.  Uses BiPAP machine at bedtime, states that his machine has not been working  well.         Past Medical History:  Diagnosis Date   (HFpEF) heart failure with preserved ejection fraction (HCC)    a.) TTE 11/26/19: EF 50-55, G1DD; b.) TTE 01/24/20: EF 50, LVH, mid-apical antsep HK, G1DD; c.) TTE 05/18/21: EF 50-55, antsep/sep/apical HK, LVH, mild MR, AoV calc; d.) TTE 07/16/21: EF 60-65, LVH, G2DD, mild MD; e.) TTE 02/18/22: EF 55, sep/inf HK, LVH, G1DD, mild MR; f.) TTE 03/19/22: EF 65-70, antsep/periapical HK, LVH, G1DD, mild MR, AoV calc; g.) TTE 02/12/2023: EF 55-60, LVH, G2DD, mild MR   Anginal pain (HCC)    Aortic atherosclerosis (HCC)    Bilateral pleural effusion 02/20/2022   a.) s/p CABG and LAA closure (L > R) --> progressive during admission --> ultimately required LEFT thoracentesis on 03/10/2022 (750 cc serous yield)   CAD (coronary artery disease)    a.)PCI 01/24/20: 90 mLAD (2.75x22 Res Onyx); b.)PCI 05/20/21: 85 mLAD (2.75x24 + 2.5x8 Onyx); c.)LHC 11/18/21: 80 ISR + 60 mLAD - CVTS vs stent POBA; d.)LHC 02/13/22: 70 ISR p-mLAD, 80/50 ISR mLAD, 99 ISR dLAD, 80 jailed D2, diff HG RCA Dz; e.)3v CABG 02/19/22; f.)LHC 02/03/23: 30 LM, 70 p-mLAD, 80/70 mLAD, 99 dLAD, 80 D2, 70/70 pRCA, 70/95 mRCA, 40 dRCA, 60 RV branch, 60 RPDA, 100 SVG-LAD - Rx mgmt   CKD (chronic kidney disease), stage III (HCC)    Complete heart block (HCC) 02/19/2022   a.) during CABG/LAA closure --> hyperkalemic CHB in OR before heart surgery had functionally begun (not on bypass yet); etiology unknown; ?? due to medication administration or contaminant given fairly normal preop renal function (Cr 1.3). Persisted post-bypass event with extensive dialysis equivalent via CPB pump --> Tx'd with dantrolene    Complication of anesthesia 02/19/2022   a.) perioperative HYPERkalemia -  concern for atypical MH initially - Tx'd intraoperaitvely with dantrolene . Preop K+ 4.6 and CR 1.3 --> K+ peaked at 7.8 intra/postop --> rec'd furosemide  + insulin  + calcium  + extended CBP (remained on pump at least 1  hour after procedure). Consult with MH society determined unlikely MH (see Argwala, MD note 02/22/2022). No confirmatory CHCT testing.   COPD (chronic obstructive pulmonary disease) (HCC)    Genital warts    GERD (gastroesophageal reflux disease)    Hiatal hernia    History of 2019 novel coronavirus disease (COVID-19) 11/25/2019   History of left atrial appendage closure 02/19/2022   a.) performed simultaneously with cardiac revascularization (CABG) procedure on 02/19/2022   Hyperlipidemia    Hypertension    Ischemic cardiomyopathy    a.) TTE 11/26/19: EF 50-55; b.)TTE 01/24/20: EF 50; c.)LHC 01/24/20: EF 45-50; d.)MV 11/29/20: EF 43; e.)TTE 05/18/21: EF 50-55; f.)LHC 05/20/21: EF 50-55; g.)TTE 07/16/21: EF 60-65; h.) TTE 11/16/21: EF 60-65; i.) LHC 11/18/21: EF 55-65; j.) TTE 02/12/22: EF 60-65; k.) TTE 02/18/22: EF 55; l.) TTE 03/19/22: EF 65-70; m.) TTE 05/01/22: EF 55; n.) MV 01/27/23: EF 45-50; o.) TTE 02/12/2023: EF 55-60   Leukocytosis    Long term current use of clopidogrel     Long-term use of aspirin  therapy    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   a.) surgical PCR (+) 02/15/2022 prior to CABG   NSTEMI (non-ST elevated myocardial infarction) (HCC) 01/23/2020   a.) troponins were trended: 3520 --> 2990 ng/L; b.) LHC/PCI 01/24/2020: 60% o-pRCA, 40% p-mRCA, 90% mLAD (2.75 x 22 mm Resolute Onyx DES), 40% dLAD   NSTEMI (non-ST elevated myocardial infarction) (HCC) 05/18/2021   a.) troponins were trended: 101 --> 110 --> 175 ng/L; b.) LHC/PCI 05/20/2021: 40% dLAD, 40% p-mRCA, 50% o-pRCA, 40% RPDA, 30% oLM, 85% mLAD with distal edge dissection (overlapping 2.75 x 34 mm and 2.5 x 8 mm Onyx Frontier DES), 60% D2   OSA on CPAP    Polysubstance abuse (HCC)    a.) cocaine, THC, opiates, tobacco   Postoperative cardiogenic shock (HCC) 02/19/2022   a.) required vasopressor/ionotropic agent support + IABP   Recurrent cellulitis of thigh    S/P CABG x 3 02/19/2022   a.) LIMA-D1, SVG-LAD,  SVG-PDA + LAA closure; b.) c/b periop HYPERkalemia --> initial concerns for atypical malignant hyperthemia --> rec'd dantrolene . Consult with MH society determined MH unlikely (see Argwala, MD note 02/22/2022); b.) c/b postop cardiogenic shock req vasopressor/inotrophic support + IABP; c.) c/b dev of BILATERAL pleural effusions (L > R) req LEFT thoracentesis (750 cc yield)   Scrotal lesion (sebaceous cyst)    Tobacco use 11/25/2019    Past Surgical History:  Procedure Laterality Date   CORONARY ARTERY BYPASS GRAFT N/A 02/19/2022   Procedure: CORONARY ARTERY BYPASS GRAFTING (CABG) X THREE, USING ENDOSCOPICALLY HARVESTED RIGHT GREATER SAPHENOUS VEIN  AND LEFT ATRIAL APPENDAGE CLIPPING;  Surgeon: Murriel Toribio DEL, MD;  Location: [facility];  Service: Open Heart Surgery;  Laterality: N/A;   CORONARY STENT INTERVENTION N/A 01/24/2020   Procedure: CORONARY STENT INTERVENTION;  Surgeon: Darron Deatrice LABOR, MD;  Location: [facility];  Service: Cardiovascular;  Laterality: N/A;   CORONARY STENT INTERVENTION N/A 05/20/2021   Procedure: CORONARY STENT INTERVENTION;  Surgeon: Darron Deatrice LABOR, MD;  Location: [facility];  Service: Cardiovascular;  Laterality: N/A;   CORONARY ULTRASOUND/IVUS N/A 05/20/2021   Procedure: Intravascular Ultrasound/IVUS;  Surgeon: Darron Deatrice LABOR, MD;  Location: [facility];  Service: Cardiovascular;  Laterality: N/A;   IRRIGATION AND DEBRIDEMENT KNEE Left    LEFT ATRIAL APPENDAGE OCCLUSION Left 02/19/2022   Procedure: LEFT ATRIAL APPENDAGE OCCLUSION; Location: [facility]; Surgeon: Toribio Colder, MD   LEFT HEART CATH AND CORONARY ANGIOGRAPHY N/A 01/24/2020   Procedure: LEFT HEART CATH AND CORONARY ANGIOGRAPHY;  Surgeon: Darron Deatrice LABOR, MD;  Location: [facility];  Service: Cardiovascular;  Laterality: N/A;   LEFT HEART CATH AND CORONARY ANGIOGRAPHY N/A 11/18/2021   Procedure: LEFT HEART CATH AND CORONARY ANGIOGRAPHY;  Surgeon: Darron Deatrice LABOR,  MD;  Location: [facility];  Service: Cardiovascular;  Laterality: N/A;   LEFT HEART CATH AND CORONARY ANGIOGRAPHY N/A 02/13/2022   Procedure: LEFT HEART CATH AND CORONARY ANGIOGRAPHY;  Surgeon: Anner Alm ORN, MD;  Location: [facility];  Service: Cardiovascular;  Laterality: N/A;   LEFT HEART CATH AND CORS/GRAFTS ANGIOGRAPHY N/A 05/20/2021   Procedure: LEFT HEART CATH AND CORS/GRAFTS ANGIOGRAPHY;  Surgeon: Darron Deatrice LABOR, MD;  Location: [facility];  Service: Cardiovascular;  Laterality: N/A;   LEFT HEART CATH AND CORS/GRAFTS ANGIOGRAPHY N/A 02/03/2023   Procedure: LEFT HEART CATH AND CORS/GRAFTS ANGIOGRAPHY;  Surgeon: Mady Bruckner, MD;  Location: [facility];  Service: Cardiovascular;  Laterality: N/A;   SCROTAL EXPLORATION N/A 03/06/2023   Procedure: EXCISION OF SCROTUM LESION, SEBACEOUS CYST;  Surgeon: Francisca Redell BROCKS, MD;  Location: [facility];  Service: Urology;  Laterality: N/A;   TYMPANOSTOMY TUBE PLACEMENT Bilateral     Family History  Problem Relation Age of Onset   Coronary artery disease Mother    Arrhythmia Mother        Pacemaker placed in 2021   Heart failure Mother    Breast cancer Mother    Coronary artery disease Father    Colon cancer Father    Colon polyps Father     Social History   Socioeconomic History   Marital status: Single    Spouse name: Not on file   Number of children: 3   Years of education: Not on file   Highest education level: Not on file  Occupational History   Not on file  Tobacco Use   Smoking status: Every Day    Current packs/day: 1.00    Average packs/day: 1 pack/day for 10.5 years (10.5 ttl pk-yrs)    Types: Cigarettes    Start date: 02/12/2013    Passive exposure: Past   Smokeless tobacco: Never   Tobacco comments:    2-3 cigarettes daily- 08/06/2022        1/2 pack daily as of 01/2023  Vaping Use   Vaping status: Never Used  Substance and Sexual Activity   Alcohol use: Yes     Alcohol/week: 1.0 standard drink of alcohol    Types: 1 Shots of liquor per week    Comment: socially   Drug use: Yes    Comment: states takes gummies occasionally   Sexual activity: Yes  Other Topics Concern   Not on file  Social History Narrative   Lives with his uncle    Social Drivers of Corporate investment banker Strain: Not on file  Food Insecurity: No Food Insecurity (07/21/2023)   Hunger Vital Sign    Worried About Running Out of Food in the Last Year: Never true    Ran Out of Food in the Last Year: Never true  Transportation Needs: No Transportation Needs (07/21/2023)   PRAPARE - Administrator, Civil Service (Medical):  No    Lack of Transportation (Non-Medical): No  Physical Activity: Not on file  Stress: Not on file  Social Connections: Unknown (08/05/2022)   Received from [facility]   Social Network    Social Network: Not on file  Intimate Partner Violence: Unknown (08/05/2022)   Received from [facility]   HITS    Physically Hurt: Not on file    Insult or Talk Down To: Not on file    Threaten Physical Harm: Not on file    Scream or Curse: Not on file    Outpatient Medications Prior to Visit  Medication Sig Dispense Refill   aspirin  EC 81 MG tablet Take 1 tablet (81 mg total) by mouth daily. Swallow whole. 90 tablet 3   clopidogrel  (PLAVIX ) 75 MG tablet Take 1 tablet (75 mg total) by mouth daily. 90 tablet 3   ezetimibe  (ZETIA ) 10 MG tablet Take 1 tablet (10 mg total) by mouth daily. 90 tablet 3   famotidine  (PEPCID ) 20 MG tablet Take 1 tablet (20 mg total) by mouth at bedtime as needed for heartburn or indigestion. 60 tablet 0   furosemide  (LASIX ) 40 MG tablet Take 1 tablet (40 mg total) by mouth daily. 90 tablet 3   hydrocortisone  (ANUSOL -HC) 25 MG suppository Place 1 suppository (25 mg total) rectally 2 (two) times daily. 12 suppository 0   isosorbide  mononitrate (IMDUR ) 30 MG 24 hr tablet Take 1 tablet (30 mg total) by mouth daily. 30 tablet  11   losartan  (COZAAR ) 50 MG tablet Take 1 tablet (50 mg total) by mouth daily. 90 tablet 3   metoprolol  succinate (TOPROL -XL) 25 MG 24 hr tablet TAKE 2 TABLETS BY MOUTH DAILY. STOP TAKING METOPROLOL  IF YOU CONTINUE TO USE COCAINE 180 tablet 3   nitroGLYCERIN  (NITROSTAT ) 0.4 MG SL tablet Place 1 tablet (0.4 mg total) under the tongue every 5 (five) minutes as needed for chest pain (if chest pain not resolved after 3 doses, please call 911). 25 tablet PRN   pantoprazole  (PROTONIX ) 40 MG tablet Take 1 tablet by mouth once daily 90 tablet 0   polyethylene glycol (MIRALAX ) 17 g packet Take 17 g by mouth daily as needed. 30 each 1   spironolactone  (ALDACTONE ) 25 MG tablet Take 1 tablet (25 mg total) by mouth daily. 90 tablet 3   rosuvastatin  (CRESTOR ) 40 MG tablet Take 1 tablet by mouth once daily 90 tablet 0   VENTOLIN  HFA 108 (90 Base) MCG/ACT inhaler Inhale 1-2 puffs into the lungs every 6 (six) hours as needed for wheezing or shortness of breath.     empagliflozin  (JARDIANCE ) 10 MG TABS tablet Take 1 tablet (10 mg total) by mouth daily. (Patient not taking: Reported on 08/24/2023) 90 tablet 3   NON FORMULARY Pt uses a bi-pap nightly     triamcinolone  (KENALOG ) 0.025 % cream APPLY TOPICALLY TWICE DAILY (Patient not taking: Reported on 07/29/2023) 30 g 0   Fluticasone-Umeclidin-Vilant (TRELEGY ELLIPTA ) 100-62.5-25 MCG/ACT AEPB Inhale 1 puff into the lungs daily. (Patient not taking: Reported on 08/24/2023) 28 each 0   No facility-administered medications prior to visit.    Allergies  Allergen Reactions   Bee Pollen     Patient states he is not allergic to bees but has seasonal allergies to pollen    ROS Review of Systems  Constitutional:  Negative for appetite change, chills, fatigue and fever.  HENT:  Negative for congestion, postnasal drip, rhinorrhea and sneezing.   Respiratory:  Positive for shortness of breath  and wheezing. Negative for cough.   Cardiovascular:  Negative for chest pain,  palpitations and leg swelling.  Gastrointestinal:  Negative for abdominal pain, constipation, nausea and vomiting.  Genitourinary:  Negative for difficulty urinating, dysuria, flank pain and frequency.  Musculoskeletal:  Negative for arthralgias, back pain, joint swelling and myalgias.  Skin:  Negative for color change, pallor, rash and wound.  Neurological:  Negative for dizziness, facial asymmetry, weakness, numbness and headaches.  Psychiatric/Behavioral:  Negative for behavioral problems, confusion, self-injury and suicidal ideas.       Objective:    Physical Exam Vitals and nursing note reviewed.  Constitutional:      General: He is not in acute distress.    Appearance: Normal appearance. He is obese. He is not ill-appearing, toxic-appearing or diaphoretic.   Eyes:     General: No scleral icterus.       Right eye: No discharge.        Left eye: No discharge.     Extraocular Movements: Extraocular movements intact.     Conjunctiva/sclera: Conjunctivae normal.    Cardiovascular:     Rate and Rhythm: Normal rate and regular rhythm.     Pulses: Normal pulses.     Heart sounds: Normal heart sounds. No murmur heard.    No friction rub. No gallop.  Pulmonary:     Effort: Pulmonary effort is normal. No respiratory distress.     Breath sounds: Normal breath sounds. No stridor. No wheezing, rhonchi or rales.  Chest:     Chest wall: No tenderness.  Abdominal:     General: There is no distension.     Palpations: Abdomen is soft.     Tenderness: There is no abdominal tenderness. There is no right CVA tenderness, left CVA tenderness or guarding.   Musculoskeletal:        General: No swelling, tenderness, deformity or signs of injury.     Right lower leg: No edema.     Left lower leg: No edema.   Skin:    General: Skin is warm and dry.     Capillary Refill: Capillary refill takes less than 2 seconds.     Coloration: Skin is not jaundiced or pale.     Findings: No bruising,  erythema or lesion.   Neurological:     Mental Status: He is alert and oriented to person, place, and time.     Motor: No weakness.     Gait: Gait normal.   Psychiatric:        Mood and Affect: Mood normal.        Behavior: Behavior normal.        Thought Content: Thought content normal.        Judgment: Judgment normal.     BP 120/65   Pulse (!) 101   Temp 98.1 F (36.7 C) (Oral)   Wt 236 lb 12.8 oz (107.4 kg)   SpO2 95%   BMI 32.12 kg/m  Wt Readings from Last 3 Encounters:  08/24/23 236 lb 12.8 oz (107.4 kg)  07/29/23 242 lb (109.8 kg)  06/16/23 238 lb 6.4 oz (108.1 kg)    No results found for: TSH Lab Results  Component Value Date   WBC 8.1 07/17/2023   HGB 15.3 07/17/2023   HCT 46.8 07/17/2023   MCV 81.7 07/17/2023   PLT 305 07/17/2023   Lab Results  Component Value Date   NA 140 07/17/2023   K 3.6 07/17/2023   CO2 25 07/17/2023  GLUCOSE 110 (H) 07/17/2023   BUN 7 07/17/2023   CREATININE 1.29 (H) 07/17/2023   BILITOT 0.7 07/17/2023   ALKPHOS 82 07/17/2023   AST 30 07/17/2023   ALT 25 07/17/2023   PROT 7.4 07/17/2023   ALBUMIN  3.8 07/17/2023   CALCIUM  9.0 07/17/2023   ANIONGAP 10 07/17/2023   EGFR 61 01/21/2023   Lab Results  Component Value Date   CHOL 150 07/29/2023   Lab Results  Component Value Date   HDL 36 (L) 07/29/2023   Lab Results  Component Value Date   LDLCALC 85 07/29/2023   Lab Results  Component Value Date   TRIG 167 (H) 07/29/2023   Lab Results  Component Value Date   CHOLHDL 4.2 07/29/2023   Lab Results  Component Value Date   HGBA1C 5.2 04/14/2022      Assessment & Plan:   Problem List Items Addressed This Visit       Cardiovascular and Mediastinum   Hypertension   Continue metoprolol  25 mg daily, spironolactone  25 mg daily, furosemide  40 mg daily isosorbide  mononitrate 30 mg daily, losartan  50 mg daily Has been referred to the clinical pharmacist for medication assistance for Jardiance  DASH diet and  commitment to daily physical activity for a minimum of 30 minutes discussed and encouraged, as a part of hypertension management.      08/24/2023    2:43 PM 08/24/2023    2:39 PM 07/29/2023    9:48 AM 07/17/2023    4:06 AM 07/17/2023    2:30 AM 07/17/2023    2:00 AM 07/17/2023   12:15 AM  BP/Weight  Systolic BP 120 142 126 135 142 128 133  Diastolic BP 65 91 75 89 92 93 81  Wt. (Lbs)  236.8 242      BMI  32.12 kg/m2 32.82 kg/m2               Chronic diastolic CHF (congestive heart failure) (HCC)   Continue metoprolol  25 mg daily, spironolactone  25 mg daily, furosemide  40 mg daily isosorbide  mononitrate 30 mg daily Has been referred to the clinical pharmacist for medication assistance for Jardiance          Respiratory   Other emphysema (HCC) - Primary   Medications refilled Smoking cessation encouraged Missed last appointment with pulmonologist he plans on rescheduling the appointment   - Fluticasone-Umeclidin-Vilant (TRELEGY ELLIPTA ) 100-62.5-25 MCG/ACT AEPB; Inhale 1 puff into the lungs daily.  Dispense: 1 each; Refill: 11 - VENTOLIN  HFA 108 (90 Base) MCG/ACT inhaler; Inhale 1-2 puffs into the lungs every 6 (six) hours as needed for wheezing or shortness of breath.  Dispense: 18 each; Refill: 3  2. Tobacco use  - nicotine  (NICODERM CQ  - DOSED IN MG/24 HOURS) 14 mg/24hr patch; Place 1 patch (14 mg total) onto the skin daily.  Dispense: 42 patch; Refill: 0 - nicotine  (NICODERM CQ  - DOSED IN MG/24 HR) 7 mg/24hr patch; Place 1 patch (7 mg total) onto the skin daily.  Dispense: 14 patch; Refill: 0       Relevant Medications   Fluticasone-Umeclidin-Vilant (TRELEGY ELLIPTA ) 100-62.5-25 MCG/ACT AEPB   VENTOLIN  HFA 108 (90 Base) MCG/ACT inhaler   nicotine  (NICODERM CQ  - DOSED IN MG/24 HOURS) 14 mg/24hr patch   nicotine  (NICODERM CQ  - DOSED IN MG/24 HR) 7 mg/24hr patch (Start on 10/05/2023)   OSA (obstructive sleep apnea)   Uses BiPAP at bedtime Since the equipment is not  functioning properly patient advised to contact the vendor  Other   Tobacco use   Smokes about 0.5 pack/day  Asked about quitting: confirms that he currently smokes cigarettes Advise to quit smoking: Educated about QUITTING to reduce the risk of cancer, cardio and cerebrovascular disease. Assess willingness: willing to quit at this time, and work on cutting back. Assist with counseling and pharmacotherapy: Counseled for 5 minutes and literature provided. Arrange for follow up: follow up in 3 months and continue to offer help.  Nicotine  patch ordered - nicotine  (NICODERM CQ  - DOSED IN MG/24 HOURS) 14 mg/24hr patch; Place 1 patch (14 mg total) onto the skin daily.  Dispense: 42 patch; Refill: 0.  Apply daily for 6 weeks then nicotine  (NICODERM CQ  - DOSED IN MG/24 HR) 7 mg/24hr patch; Place 1 patch (7 mg total) onto the skin daily.  Dispense: 14 patch; Refill: 0        Relevant Medications   nicotine  (NICODERM CQ  - DOSED IN MG/24 HOURS) 14 mg/24hr patch   nicotine  (NICODERM CQ  - DOSED IN MG/24 HR) 7 mg/24hr patch (Start on 10/05/2023)   Hyperlipidemia   Lab Results  Component Value Date   CHOL 150 07/29/2023   HDL 36 (L) 07/29/2023   LDLCALC 85 07/29/2023   TRIG 167 (H) 07/29/2023   CHOLHDL 4.2 07/29/2023  LDL goal is less than 55 Checking lipid panel in 2 weeks Continue Crestor  40 mg daily and Zetia  10 mg daily Avoid fatty fried foods      Relevant Orders   Lipid panel   Cocaine abuse (HCC)   Denies use recent use of cocaine Encouraged to continue to abstain from use of illicit drugs       Meds ordered this encounter  Medications   Fluticasone-Umeclidin-Vilant (TRELEGY ELLIPTA ) 100-62.5-25 MCG/ACT AEPB    Sig: Inhale 1 puff into the lungs daily.    Dispense:  1 each    Refill:  11   VENTOLIN  HFA 108 (90 Base) MCG/ACT inhaler    Sig: Inhale 1-2 puffs into the lungs every 6 (six) hours as needed for wheezing or shortness of breath.    Dispense:  18 each     Refill:  3   nicotine  (NICODERM CQ  - DOSED IN MG/24 HOURS) 14 mg/24hr patch    Sig: Place 1 patch (14 mg total) onto the skin daily.    Dispense:  42 patch    Refill:  0   nicotine  (NICODERM CQ  - DOSED IN MG/24 HR) 7 mg/24hr patch    Sig: Place 1 patch (7 mg total) onto the skin daily.    Dispense:  14 patch    Refill:  0    Follow-up: Return in about 2 months (around 10/24/2023) for smoking cessation.    Jakyla Reza R Dorie Ohms, FNP

## 2023-08-24 NOTE — Assessment & Plan Note (Signed)
 Denies use recent use of cocaine Encouraged to continue to abstain from use of illicit drugs

## 2023-08-24 NOTE — Assessment & Plan Note (Addendum)
 Smokes about 0.5 pack/day  Asked about quitting: confirms that he currently smokes cigarettes Advise to quit smoking: Educated about QUITTING to reduce the risk of cancer, cardio and cerebrovascular disease. Assess willingness: willing to quit at this time, and work on cutting back. Assist with counseling and pharmacotherapy: Counseled for 5 minutes and literature provided. Arrange for follow up: follow up in 3 months and continue to offer help.  Nicotine  patch ordered - nicotine  (NICODERM CQ  - DOSED IN MG/24 HOURS) 14 mg/24hr patch; Place 1 patch (14 mg total) onto the skin daily.  Dispense: 42 patch; Refill: 0.  Apply daily for 6 weeks then nicotine  (NICODERM CQ  - DOSED IN MG/24 HR) 7 mg/24hr patch; Place 1 patch (7 mg total) onto the skin daily.  Dispense: 14 patch; Refill: 0

## 2023-08-26 ENCOUNTER — Other Ambulatory Visit: Payer: Self-pay | Admitting: Pharmacist

## 2023-08-26 ENCOUNTER — Other Ambulatory Visit: Payer: Self-pay | Admitting: Emergency Medicine

## 2023-08-26 ENCOUNTER — Other Ambulatory Visit (HOSPITAL_COMMUNITY): Payer: Self-pay

## 2023-08-26 DIAGNOSIS — J438 Other emphysema: Secondary | ICD-10-CM

## 2023-08-26 DIAGNOSIS — K219 Gastro-esophageal reflux disease without esophagitis: Secondary | ICD-10-CM

## 2023-08-26 DIAGNOSIS — Z951 Presence of aortocoronary bypass graft: Secondary | ICD-10-CM

## 2023-08-26 DIAGNOSIS — E782 Mixed hyperlipidemia: Secondary | ICD-10-CM

## 2023-08-26 MED ORDER — LOSARTAN POTASSIUM 50 MG PO TABS
50.0000 mg | ORAL_TABLET | Freq: Every day | ORAL | 3 refills | Status: DC
  Filled 2023-08-26 – 2023-08-31 (×2): qty 90, 90d supply, fill #0

## 2023-08-26 MED ORDER — FUROSEMIDE 40 MG PO TABS
40.0000 mg | ORAL_TABLET | Freq: Every day | ORAL | 3 refills | Status: AC
Start: 2023-08-26 — End: ?
  Filled 2023-08-26 – 2023-08-31 (×2): qty 90, 90d supply, fill #0

## 2023-08-26 MED ORDER — TRELEGY ELLIPTA 100-62.5-25 MCG/ACT IN AEPB
1.0000 | INHALATION_SPRAY | Freq: Every day | RESPIRATORY_TRACT | 11 refills | Status: AC
  Filled 2023-08-26: qty 60, 30d supply, fill #0

## 2023-08-26 MED ORDER — METOPROLOL SUCCINATE ER 25 MG PO TB24
25.0000 mg | ORAL_TABLET | Freq: Two times a day (BID) | ORAL | 3 refills | Status: DC
  Filled 2023-08-26 – 2023-08-31 (×2): qty 180, 90d supply, fill #0

## 2023-08-26 MED ORDER — ROSUVASTATIN CALCIUM 40 MG PO TABS
40.0000 mg | ORAL_TABLET | Freq: Every day | ORAL | 3 refills | Status: AC
Start: 2023-08-26 — End: ?
  Filled 2023-08-26 – 2023-08-31 (×2): qty 90, 90d supply, fill #0

## 2023-08-26 MED ORDER — PANTOPRAZOLE SODIUM 40 MG PO TBEC
40.0000 mg | DELAYED_RELEASE_TABLET | Freq: Every day | ORAL | 0 refills | Status: DC
  Filled 2023-08-26 – 2023-08-31 (×2): qty 90, 90d supply, fill #0

## 2023-08-26 MED ORDER — CLOPIDOGREL BISULFATE 75 MG PO TABS
75.0000 mg | ORAL_TABLET | Freq: Every day | ORAL | 3 refills | Status: DC
  Filled 2023-08-26 – 2023-08-31 (×2): qty 90, 90d supply, fill #0

## 2023-08-26 MED ORDER — EZETIMIBE 10 MG PO TABS
10.0000 mg | ORAL_TABLET | Freq: Every day | ORAL | 3 refills | Status: DC
  Filled 2023-08-26 – 2023-08-31 (×2): qty 90, 90d supply, fill #0

## 2023-08-26 MED ORDER — ASPIRIN 81 MG PO TBEC
81.0000 mg | DELAYED_RELEASE_TABLET | Freq: Every day | ORAL | 3 refills | Status: DC
  Filled 2023-08-26: qty 90, 90d supply, fill #0

## 2023-08-26 MED ORDER — EMPAGLIFLOZIN 10 MG PO TABS
10.0000 mg | ORAL_TABLET | Freq: Every day | ORAL | 3 refills | Status: AC
  Filled 2023-08-26 – 2023-08-31 (×2): qty 90, 90d supply, fill #0
  Filled 2023-10-28: qty 84, 84d supply, fill #0
  Filled 2023-10-28: qty 6, 6d supply, fill #0
  Filled 2023-10-28 (×2): qty 90, 90d supply, fill #0

## 2023-08-26 MED ORDER — SPIRONOLACTONE 25 MG PO TABS
25.0000 mg | ORAL_TABLET | Freq: Every day | ORAL | 3 refills | Status: AC
  Filled 2023-08-26: qty 90, 90d supply, fill #0

## 2023-08-26 NOTE — Progress Notes (Signed)
 Care Coordination Call  Spoke with patient. He continues to note cost concerns with Jardiance . Appears he has a Medicaid plan, prior authorization may be required.   He notes that he just realized he is out of losartan , he is unsure how long he has gone without. Discussed medication adherence strategies; he is interested in utilizing adherence packaging.   Denies concerns with increased swelling today. Denies shortness of breath.   Plan: - Will collaborate with tech team to determine if PA is required for Jardiance  - Will collaborate with prescribers to have prescriptions sent to [facility] [place] for adherence packaging.   Catie IVAR Centers, PharmD, [facility] Clinical Pharmacist [phone]

## 2023-08-26 NOTE — Progress Notes (Signed)
 Orders Signed This Visit (7)   aspirin  EC 81 MG tablet        Take 1 tablet (81 mg total) by mouth daily. Swallow whole., Starting Wed 08/26/2023, Normal     clopidogrel  (PLAVIX ) 75 MG tablet        Take 1 tablet (75 mg total) by mouth daily., Starting Wed 08/26/2023, Normal     empagliflozin  (JARDIANCE ) 10 MG TABS tablet        Take 1 tablet (10 mg total) by mouth daily., Starting Wed 08/26/2023, Normal     furosemide  (LASIX ) 40 MG tablet        Take 1 tablet (40 mg total) by mouth daily., Starting Wed 08/26/2023, Normal     losartan  (COZAAR ) 50 MG tablet        Take 1 tablet (50 mg total) by mouth daily., Starting Wed 08/26/2023, Normal     metoprolol  succinate (TOPROL -XL) 25 MG 24 hr tablet        Take 1 tablet (25 mg total) by mouth 2 (two) times daily., Starting Wed 08/26/2023, Normal     spironolactone  (ALDACTONE ) 25 MG tablet        Take 1 tablet (25 mg total) by mouth daily., Starting Wed 08/26/2023, Normal   Prescriptions sent to:   [facility]

## 2023-08-27 ENCOUNTER — Telehealth (HOSPITAL_COMMUNITY): Payer: Self-pay | Admitting: Cardiology

## 2023-08-27 ENCOUNTER — Telehealth: Payer: Self-pay

## 2023-08-27 ENCOUNTER — Other Ambulatory Visit (HOSPITAL_COMMUNITY): Payer: Self-pay

## 2023-08-27 ENCOUNTER — Other Ambulatory Visit: Payer: Self-pay

## 2023-08-27 NOTE — Telephone Encounter (Signed)
 Advanced Heart Failure Patient Advocate Encounter  Prior authorization for Jardiance  has been submitted and approved. Test billing returns $4 for 90 day supply.  Key: [id number] Effective: 08/13/2023 to 08/26/2024  Rachel DEL, CPhT Rx Patient Advocate Phone: [phone]

## 2023-08-27 NOTE — Telephone Encounter (Signed)
-----   Message from Toribio Fuel sent at 08/27/2023  3:54 PM EDT ----- Can you help with this? ----- Message ----- From: Rudy Dorothyann DASEN, RPH-CPP Sent: 08/26/2023   3:06 PM EDT To: Toribio JONELLE Fuel, MD; Barnie Hila, #  Hi Dr. Fuel and Barnie,   This patient is interested in adherence packaging available through the [facility] at [facility]. Could you all please send his aspirin , clopidogrel , furosemide , losartan , Jardiance , spironolactone , and metoprolol  prescriptions to this preferred pharmacy to start the process? Thanks!  Catie IVAR Rudy, PharmD, [facility] Clinical Pharmacist [phone]

## 2023-08-27 NOTE — Telephone Encounter (Signed)
 Meds have been sent by pharmacy team

## 2023-08-27 NOTE — Progress Notes (Signed)
 PA needed for Jardiance . Will collaborate with HF team.   Catie IVAR Centers, PharmD, [facility] Clinical Pharmacist [phone]

## 2023-08-31 ENCOUNTER — Other Ambulatory Visit: Payer: Self-pay

## 2023-09-03 ENCOUNTER — Ambulatory Visit: Admitting: Urology

## 2023-09-03 VITALS — BP 102/69 | HR 80 | Ht 72.0 in | Wt 242.7 lb

## 2023-09-03 DIAGNOSIS — R399 Unspecified symptoms and signs involving the genitourinary system: Secondary | ICD-10-CM

## 2023-09-03 DIAGNOSIS — R3121 Asymptomatic microscopic hematuria: Secondary | ICD-10-CM | POA: Diagnosis not present

## 2023-09-03 DIAGNOSIS — N99115 Postprocedural fossa navicularis urethral stricture: Secondary | ICD-10-CM

## 2023-09-03 LAB — BLADDER SCAN AMB NON-IMAGING

## 2023-09-03 NOTE — Progress Notes (Signed)
   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  This was dilated in clinic, but he did not tolerate cystoscopy well and was unable to advance scope past the prostate into the bladder secondary to patient discomfort.  He temporarily performed intermittent catheterizations, but has done well from that perspective.  He also underwent excision of multiple sebaceous cyst of the scrotum in January 2025, these have ultimately healed and no complaints from that perspective.  He also was recently found to have microscopic hematuria with Clotilda Cornwall, PA, also atypical culture positive and treated with antibiotics.  She ordered a CT urogram which was benign.  I personally viewed and interpreted those images that showed no hydronephrosis, filling defects or stones.  I offered cystoscopy, but he deferred based on his discomfort from previously.  We discussed the low, but nonzero, risk of missing an additional pathology by deferring cystoscopy and he understands these risks.  His primary urinary complaint is frequency secondary to Lasix .  Noncompliant with CPAP for sleep apnea.  Need to have realistic expectations.  PVR normal again today at 25ml.  His urinary frequency is likely component of untreated sleep apnea, diabetes with glucosuria, and diuretics.  Follow-up with urology as needed  Logan JAYSON Burnet, MD  [facility] [address], [address] [place], [place] [address] [phone]

## 2023-09-03 NOTE — Patient Instructions (Signed)

## 2023-09-04 ENCOUNTER — Other Ambulatory Visit (HOSPITAL_COMMUNITY): Payer: Self-pay

## 2023-09-07 ENCOUNTER — Other Ambulatory Visit: Payer: Self-pay

## 2023-09-07 DIAGNOSIS — E785 Hyperlipidemia, unspecified: Secondary | ICD-10-CM

## 2023-09-08 ENCOUNTER — Ambulatory Visit: Payer: Self-pay | Admitting: Nurse Practitioner

## 2023-09-08 ENCOUNTER — Other Ambulatory Visit: Payer: Self-pay | Admitting: Nurse Practitioner

## 2023-09-08 ENCOUNTER — Other Ambulatory Visit (HOSPITAL_COMMUNITY): Payer: Self-pay

## 2023-09-08 DIAGNOSIS — E781 Pure hyperglyceridemia: Secondary | ICD-10-CM

## 2023-09-08 LAB — LIPID PANEL
Chol/HDL Ratio: 2.7 ratio (ref 0.0–5.0)
Cholesterol, Total: 106 mg/dL (ref 100–199)
HDL: 39 mg/dL — ABNORMAL LOW (ref >39)
LDL Chol Calc (NIH): 40 mg/dL (ref 0–99)
Triglycerides: 158 mg/dL — ABNORMAL HIGH (ref 0–149)
VLDL Cholesterol Cal: 27 mg/dL (ref 5–40)

## 2023-09-08 MED ORDER — FENOFIBRATE 48 MG PO TABS
48.0000 mg | ORAL_TABLET | Freq: Every day | ORAL | 1 refills | Status: DC
  Filled 2023-09-08: qty 90, 90d supply, fill #0

## 2023-09-09 ENCOUNTER — Ambulatory Visit: Admitting: Physician Assistant

## 2023-09-09 ENCOUNTER — Encounter: Payer: Self-pay | Admitting: Physician Assistant

## 2023-09-09 VITALS — BP 151/82

## 2023-09-09 DIAGNOSIS — L918 Other hypertrophic disorders of the skin: Secondary | ICD-10-CM | POA: Diagnosis not present

## 2023-09-09 DIAGNOSIS — A630 Anogenital (venereal) warts: Secondary | ICD-10-CM | POA: Diagnosis not present

## 2023-09-09 DIAGNOSIS — D2372 Other benign neoplasm of skin of left lower limb, including hip: Secondary | ICD-10-CM | POA: Diagnosis not present

## 2023-09-09 NOTE — Patient Instructions (Signed)

## 2023-09-09 NOTE — Progress Notes (Signed)
   New Patient Visit   Subjective  Logan Crawford is a 48 y.o. male who presents for the following: Genital warts and scrotal cysts. He has had these for a few years and has self treated them with OTC cryotherapy. His GI doctor said he also has warts in/near anus after having colonoscopy. He denies any known lesion(s) in this area. Has had the scrotal cysts removed already but does say he gets them from time to time. Does pick at these. Also thinks he has a wart on his left inner upper thigh. Does have known axillary skin tags and a lesion on his left calf.       The following portions of the chart were reviewed this encounter and updated as appropriate: medications, allergies, medical history  Review of Systems:  No other skin or systemic complaints except as noted in HPI or Assessment and Plan.  Objective  Well appearing patient in no apparent distress; mood and affect are within normal limits.   A focused examination was performed of the following areas: Genital areas, buttocks, left leg  Relevant exam findings are noted in the Assessment and Plan.  Left Medial Thigh x 1, penis x 4 (5) Verrucous papules Left Buttock Flesh colored papule   Assessment & Plan   CONDYLOMA ACUMINATUM  - also known as genital warts, is a sexually transmitted infection caused by certain types of the human papillomavirus (HPV).  -  Discussed contagious nature and treatment options -  Plan treatment today with cryotherapy.    DERMATOFIBROMA Exam: Firm pink/brown papulenodule with dimple sign of left calf. Treatment Plan: A dermatofibroma is a benign growth possibly related to trauma, such as an insect bite, cut from shaving, or inflamed acne-type bump.  Treatment options to remove include shave or excision with resulting scar and risk of recurrence.  Since benign-appearing and not bothersome, will observe for now.   Acrochordons (Skin Tags) - Fleshy, skin-colored pedunculated papules - axillae.   - Benign appearing.  - Observe. - If desired, they can be removed with an in office procedure that is not covered by insurance. - Reassured patient that lesion peri-rectally is a skin tag and not condyloma.   CONDYLOMA (5) Left Medial Thigh x 1, penis x 4 (5) Destruction of lesion - Left Medial Thigh x 1, penis x 4 (5) Complexity: simple   Destruction method: cryotherapy   Informed consent: discussed and consent obtained   Timeout:  patient name, date of birth, surgical site, and procedure verified Lesion destroyed using liquid nitrogen: Yes   Region frozen until ice ball extended beyond lesion: Yes   Outcome: patient tolerated procedure well with no complications   Post-procedure details: wound care instructions given    SKIN TAG Left Buttock Destruction of lesion - Left Buttock Complexity: simple   Destruction method: cryotherapy   Informed consent: discussed and consent obtained   Timeout:  patient name, date of birth, surgical site, and procedure verified Lesion destroyed using liquid nitrogen: Yes   Region frozen until ice ball extended beyond lesion: Yes   Outcome: patient tolerated procedure well with no complications   Post-procedure details: wound care instructions given     Return in about 4 weeks (around 10/07/2023) for Follow up warts.  I, Roseline Hutchinson, CMA, am acting as scribe for SANDRIDGE,BRENDA K, PA-C .   Documentation: I have reviewed the above documentation for accuracy and completeness, and I agree with the above.  SANDRIDGE,BRENDA K, PA-C

## 2023-09-18 ENCOUNTER — Other Ambulatory Visit (HOSPITAL_COMMUNITY): Payer: Self-pay

## 2023-09-22 ENCOUNTER — Other Ambulatory Visit (HOSPITAL_COMMUNITY): Payer: Self-pay

## 2023-09-24 ENCOUNTER — Other Ambulatory Visit (HOSPITAL_COMMUNITY): Payer: Self-pay

## 2023-09-30 ENCOUNTER — Other Ambulatory Visit: Payer: Self-pay

## 2023-09-30 ENCOUNTER — Telehealth: Payer: Self-pay

## 2023-09-30 NOTE — Progress Notes (Deleted)
 09/30/2023 Name: Logan Crawford MRN: [id number] DOB: 02/11/1976  No chief complaint on file.   Logan Crawford is a 48 y.o. year old male who presented for a telephone visit.   They were referred to the pharmacist by their PCP for assistance in managing diabetes and medication access. 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    Subjective: Patient was last seen by PCP, Lorice Shall, NP, on 08/24/23. At last visit, patient reported difficulty obtaining Jardiance . He had also run out of trelegy and was having ShOB. He was contacted by the pharmacist on 09/15/23 and PA for Jardiance  was completed.  Today, patient presents in *** good spirits and presents without *** any assistance. ***Patient is accompanied by ***.    Care Team: Primary Care Provider: Paseda, Folashade R, FNP ; Next Scheduled Visit: 10/28/23 Cardiologist: Dr. Bensimhon; Next Scheduled Visit: Needs to be scheduled ~ Oct 2025  Medication Access/Adherence  Current Pharmacy:  [facility] [address] [place] [address] Phone: [phone] Fax: [phone]   Patient reports affordability concerns with their medications: {YES/NO:21197} Patient reports access/transportation concerns to their pharmacy: {YES/NO:21197} Patient reports adherence concerns with their medications:  {YES/NO:21197} ***  *** Patient denies adherence with medications, reports missing *** medications *** times per week, on average.   Heart Failure (EF 55-60% in Dec 2024):  Current medications:  ACEi/ARB/ARNI: losartan  50 mg dail SGLT2i: Jardiance  10 mg daily Beta blocker: metoprolol  succinate 25 mg BID Mineralocorticoid Receptor Antagonist: spironolactone  25 mg daily Diuretic regimen: furosemide  40 mg daily Nitrate: isosorbide  mononitrate 30 mg daily  (angina)  Current home blood pressure readings: *** Current home weights: ***  Patient {Actions; denies-reports:120008} volume overload signs or symptoms including ***shortness of breath, lower extremity edema, increased use of pillows at night   Current physical activity: ***  Current medication access support: *** COPD:  Current medications: Trelegy Ellipta  1 puff daily Medications tried in the past:   Reports *** exacerbations in the past year  mMRC score: *** CAT score: ***  Current medication access support: *** Tobacco Abuse:  Currently using nicotine  patch *** mg once daily  Tobacco Use History: Age when started using tobacco on a daily basis *** Number of cigarettes per day *** Smokes first cigarette *** minutes after waking {Does/does not:3044014::Does,Does not} wake at night to smoke Triggers include   Quit Attempt History: Most recent quit attempt *** Longest time ever been tobacco free *** Methods tried in the past include {CHL AMB PCMH MEDICATIONS FOR SMOKING CESSATION:20759}.  Rates IMPORTANCE of quitting tobacco on 1-10 scale of ***. Rates READINESS of quitting tobacco on 1-10 scale of ***. Rates CONFIDENCE of quitting tobacco on 1-10 scale of ***. Motivators to quitting include ***; barriers include {smoking cessation barriers:18118}   Current medication access support: ***   Objective:  BP Readings from Last 3 Encounters:  09/09/23 (!) 151/82  09/03/23 102/69  08/24/23 120/65    Lab Results  Component Value Date   HGBA1C 5.2 04/14/2022   HGBA1C 5.8 (H) 05/18/2021   HGBA1C 5.7 (H) 11/26/2019       Latest Ref Rng & Units 07/17/2023   12:22 AM 06/11/2023    2:21 PM 03/17/2023   12:21 AM  BMP  Glucose 70 - 99 mg/dL 889   889   BUN 6 - 20 mg/dL 7  9   Creatinine 0.61 - 1.24 mg/dL 8.70  8.49  8.53   Sodium 135 - 145 mmol/L 140   134   Potassium 3.5 - 5.1 mmol/L 3.6   3.4   Chloride 98 - 111 mmol/L 105   104   CO2 22 - 32 mmol/L 25    19   Calcium  8.9 - 10.3 mg/dL 9.0   8.9     Lab Results  Component Value Date   CHOL 106 09/07/2023   HDL 39 (L) 09/07/2023   LDLCALC 40 09/07/2023   TRIG 158 (H) 09/07/2023   CHOLHDL 2.7 09/07/2023    Medications Reviewed Today   Medications were not reviewed in this encounter       Assessment/Plan:   {Pharmacy A/P Choices:26421}  Written patient instructions provided. Patient verbalized understanding of treatment plan.   Follow Up Plan:  Pharmacist *** PCP clinic visit 10/28/23   Lorain Baseman, PharmD [facility] [phone]

## 2023-09-30 NOTE — Telephone Encounter (Signed)
 Attempted to contact patient for scheduled appointment for medication management x2. Left HIPAA compliant message for patient to return my call at their convenience.   Lorain Baseman, PharmD [facility] [phone]

## 2023-10-05 ENCOUNTER — Encounter: Payer: Self-pay | Admitting: Physician Assistant

## 2023-10-05 ENCOUNTER — Ambulatory Visit: Admitting: Physician Assistant

## 2023-10-05 VITALS — BP 122/77

## 2023-10-05 DIAGNOSIS — A630 Anogenital (venereal) warts: Secondary | ICD-10-CM | POA: Diagnosis not present

## 2023-10-05 NOTE — Progress Notes (Addendum)
   Follow-Up Visit   Subjective  Logan Crawford is a 48 y.o. male who presents for the following: Condyloma follow up - treated with LN2 09/09/2023. He thinks he only has one area left.   The following portions of the chart were reviewed this encounter and updated as appropriate: medications, allergies, medical history  Review of Systems:  No other skin or systemic complaints except as noted in HPI or Assessment and Plan.  Objective  Well appearing patient in no apparent distress; mood and affect are within normal limits.  A focused examination was performed of the following areas: Genital area to include penis and scrotum.    Relevant exam findings are noted in the Assessment and Plan.  Pubic Flesh colored verrucous papule  Assessment & Plan    CONDYLOMA - PENIS  - one verrucous papule.   CONDYLOMA Pubic Destruction of lesion - Pubic Complexity: simple   Destruction method: cryotherapy   Informed consent: discussed and consent obtained   Timeout:  patient name, date of birth, surgical site, and procedure verified Lesion destroyed using liquid nitrogen: Yes   Region frozen until ice ball extended beyond lesion: Yes   Outcome: patient tolerated procedure well with no complications   Post-procedure details: wound care instructions given     Return in about 4 weeks (around 11/02/2023) for Follow up.  I, Roseline Hutchinson, CMA, am acting as scribe for Harriet Sutphen K, PA-C .   Documentation: I have reviewed the above documentation for accuracy and completeness, and I agree with the above.  Jacques Fife K, PA-C

## 2023-10-05 NOTE — Patient Instructions (Signed)

## 2023-10-09 NOTE — Addendum Note (Signed)
 Addended by: ORMAN AMERICA on: 10/09/2023 04:54 PM   Modules accepted: Level of Service

## 2023-10-28 ENCOUNTER — Ambulatory Visit (INDEPENDENT_AMBULATORY_CARE_PROVIDER_SITE_OTHER): Payer: Self-pay | Admitting: Nurse Practitioner

## 2023-10-28 ENCOUNTER — Other Ambulatory Visit (HOSPITAL_COMMUNITY): Payer: Self-pay

## 2023-10-28 ENCOUNTER — Other Ambulatory Visit (HOSPITAL_BASED_OUTPATIENT_CLINIC_OR_DEPARTMENT_OTHER): Payer: Self-pay

## 2023-10-28 ENCOUNTER — Encounter: Payer: Self-pay | Admitting: Nurse Practitioner

## 2023-10-28 VITALS — BP 116/73 | HR 70 | Wt 244.4 lb

## 2023-10-28 DIAGNOSIS — I5032 Chronic diastolic (congestive) heart failure: Secondary | ICD-10-CM

## 2023-10-28 DIAGNOSIS — J438 Other emphysema: Secondary | ICD-10-CM | POA: Diagnosis not present

## 2023-10-28 DIAGNOSIS — I2510 Atherosclerotic heart disease of native coronary artery without angina pectoris: Secondary | ICD-10-CM | POA: Diagnosis not present

## 2023-10-28 DIAGNOSIS — Z720 Tobacco use: Secondary | ICD-10-CM

## 2023-10-28 DIAGNOSIS — E785 Hyperlipidemia, unspecified: Secondary | ICD-10-CM

## 2023-10-28 NOTE — Assessment & Plan Note (Signed)
  Emphysema likely due to smoking. Shortness of breath present. Missed pulmonology appointment. - Continue albuterol  inhaler as needed, two puffs every six hours. - Continue Trelegy, one puff daily. - Advised to reschedule missed pulmonology appointment. - Instructed to monitor cough and report if it worsens or if fever and chills develop.

## 2023-10-28 NOTE — Assessment & Plan Note (Addendum)
  Chronic diastolic heart failure with non-adherence to Jardiance . Shortness of breath noted, no chest pain or edema. - Instructed to pick up Jardiance  10 mg tablets from [facility]. continue metoprolol  25 mg daily, losartan  50 mg daily, spironolactone  25 mg daily, furosemide  40 mg daily, Farxiga  10 mg daily.  - Advised to stay active and exercise as tolerated. - Recommended a heart-healthy, low-salt, low-fat diet.

## 2023-10-28 NOTE — Patient Instructions (Signed)
 Please cone fasting to your next appointment  Pick up your prescription for Jardiance  at [facility] as discussed    It is important that you exercise regularly at least 30 minutes 5 times a week as tolerated  Think about what you will eat, plan ahead. Choose  clean, green, fresh or frozen over canned, processed or packaged foods which are more sugary, salty and fatty. 70 to 75% of food eaten should be vegetables and fruit. Three meals at set times with snacks allowed between meals, but they must be fruit or vegetables. Aim to eat over a 12 hour period , example 7 am to 7 pm, and STOP after  your last meal of the day. Drink water,generally about 64 ounces per day, no other drink is as healthy. Fruit juice is best enjoyed in a healthy way, by EATING the fruit.  Thanks for choosing [facility] we consider it a privelige to serve you.

## 2023-10-28 NOTE — Assessment & Plan Note (Signed)
 Continue Plavix  75 mg daily, aspirin  81 mg daily, Crestor  40 mg daily, Zetia  10 mg daily .

## 2023-10-28 NOTE — Assessment & Plan Note (Signed)
 Nicotine  dependence, current, with use of nicotine  patch Nicotine  dependence with reduced smoking due to nicotine  patch. Occasional smoking when patch unavailable. - Continue 14 mg nicotine  patches. - Encouraged complete smoking cessation. - Advised to ensure availability of patches to prevent smoking relapse.

## 2023-10-28 NOTE — Progress Notes (Signed)
 Established Patient Office Visit  Subjective:  Patient ID: Logan Crawford, male    DOB: 11-25-1975  Age: 48 y.o. MRN: [id number]  CC:  Chief Complaint  Patient presents with   Follow-up    HPI    Discussed the use of AI scribe software for clinical note transcription with the patient, who gave verbal consent to proceed.  History of Present Illness Logan Crawford is a 48 year old male with heart failure who presents for medication management and follow-up.  He has not taken his prescribed medication, Jardiance , for the past six months. he has not picked up his prescription from [facility], where it is currently waiting for him. He usually gets his medications from [facility] but has experienced high costs there and is considering switching to [employer] for mail delivery services.  He experiences shortness of breath but has no chest pain or leg swelling. He uses an albuterol  inhaler as needed, two puffs every six hours, and takes Trelegy, one puff daily, for his respiratory condition. He has a history of smoking for fifteen years and has been diagnosed with emphysema. He quit smoking three months ago with the help of nicotine  patches, which he now receives at a reduced cost through his prescription. Occasionally, he smokes if he forgets his patch.  Missed his last appointment with pulmonologist and plan to reschedule the appointment      Past Medical History:  Diagnosis Date   (HFpEF) heart failure with preserved ejection fraction (HCC)    a.) TTE 11/26/19: EF 50-55, G1DD; b.) TTE 01/24/20: EF 50, LVH, mid-apical antsep HK, G1DD; c.) TTE 05/18/21: EF 50-55, antsep/sep/apical HK, LVH, mild MR, AoV calc; d.) TTE 07/16/21: EF 60-65, LVH, G2DD, mild MD; e.) TTE 02/18/22: EF 55, sep/inf HK, LVH, G1DD, mild MR; f.) TTE 03/19/22: EF 65-70, antsep/periapical HK, LVH, G1DD, mild MR, AoV calc; g.) TTE 02/12/2023: EF 55-60, LVH, G2DD, mild MR   Anginal pain (HCC)     Aortic atherosclerosis (HCC)    Bilateral pleural effusion 02/20/2022   a.) s/p CABG and LAA closure (L > R) --> progressive during admission --> ultimately required LEFT thoracentesis on 03/10/2022 (750 cc serous yield)   CAD (coronary artery disease)    a.)PCI 01/24/20: 90 mLAD (2.75x22 Res Onyx); b.)PCI 05/20/21: 85 mLAD (2.75x24 + 2.5x8 Onyx); c.)LHC 11/18/21: 80 ISR + 60 mLAD - CVTS vs stent POBA; d.)LHC 02/13/22: 70 ISR p-mLAD, 80/50 ISR mLAD, 99 ISR dLAD, 80 jailed D2, diff HG RCA Dz; e.)3v CABG 02/19/22; f.)LHC 02/03/23: 30 LM, 70 p-mLAD, 80/70 mLAD, 99 dLAD, 80 D2, 70/70 pRCA, 70/95 mRCA, 40 dRCA, 60 RV branch, 60 RPDA, 100 SVG-LAD - Rx mgmt   CKD (chronic kidney disease), stage III (HCC)    Complete heart block (HCC) 02/19/2022   a.) during CABG/LAA closure --> hyperkalemic CHB in OR before heart surgery had functionally begun (not on bypass yet); etiology unknown; ?? due to medication administration or contaminant given fairly normal preop renal function (Cr 1.3). Persisted post-bypass event with extensive dialysis equivalent via CPB pump --> Tx'd with dantrolene    Complication of anesthesia 02/19/2022   a.) perioperative HYPERkalemia - concern for atypical MH initially - Tx'd intraoperaitvely with dantrolene . Preop K+ 4.6 and CR 1.3 --> K+ peaked at 7.8 intra/postop --> rec'd furosemide  + insulin  + calcium  + extended CBP (remained on pump at least 1 hour after procedure). Consult with MH society determined unlikely MH (see Argwala, MD note 02/22/2022). No confirmatory  CHCT testing.   COPD (chronic obstructive pulmonary disease) (HCC)    Genital warts    GERD (gastroesophageal reflux disease)    Hiatal hernia    History of 2019 novel coronavirus disease (COVID-19) 11/25/2019   History of left atrial appendage closure 02/19/2022   a.) performed simultaneously with cardiac revascularization (CABG) procedure on 02/19/2022   Hyperlipidemia    Hypertension    Ischemic cardiomyopathy    a.)  TTE 11/26/19: EF 50-55; b.)TTE 01/24/20: EF 50; c.)LHC 01/24/20: EF 45-50; d.)MV 11/29/20: EF 43; e.)TTE 05/18/21: EF 50-55; f.)LHC 05/20/21: EF 50-55; g.)TTE 07/16/21: EF 60-65; h.) TTE 11/16/21: EF 60-65; i.) LHC 11/18/21: EF 55-65; j.) TTE 02/12/22: EF 60-65; k.) TTE 02/18/22: EF 55; l.) TTE 03/19/22: EF 65-70; m.) TTE 05/01/22: EF 55; n.) MV 01/27/23: EF 45-50; o.) TTE 02/12/2023: EF 55-60   Leukocytosis    Long term current use of clopidogrel     Long-term use of aspirin  therapy    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   a.) surgical PCR (+) 02/15/2022 prior to CABG   NSTEMI (non-ST elevated myocardial infarction) (HCC) 01/23/2020   a.) troponins were trended: 3520 --> 2990 ng/L; b.) LHC/PCI 01/24/2020: 60% o-pRCA, 40% p-mRCA, 90% mLAD (2.75 x 22 mm Resolute Onyx DES), 40% dLAD   NSTEMI (non-ST elevated myocardial infarction) (HCC) 05/18/2021   a.) troponins were trended: 101 --> 110 --> 175 ng/L; b.) LHC/PCI 05/20/2021: 40% dLAD, 40% p-mRCA, 50% o-pRCA, 40% RPDA, 30% oLM, 85% mLAD with distal edge dissection (overlapping 2.75 x 34 mm and 2.5 x 8 mm Onyx Frontier DES), 60% D2   OSA on CPAP    Polysubstance abuse (HCC)    a.) cocaine, THC, opiates, tobacco   Postoperative cardiogenic shock (HCC) 02/19/2022   a.) required vasopressor/ionotropic agent support + IABP   Recurrent cellulitis of thigh    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 Consult with MH society determined MH unlikely (see Argwala, MD note 02/22/2022); b.) c/b postop cardiogenic shock req vasopressor/inotrophic support + IABP; c.) c/b dev of BILATERAL pleural effusions (L > R) req LEFT thoracentesis (750 cc yield)   Scrotal lesion (sebaceous cyst)    Tobacco use 11/25/2019    Past Surgical History:  Procedure Laterality Date   CORONARY ARTERY BYPASS GRAFT N/A 02/19/2022   Procedure:  CORONARY ARTERY BYPASS GRAFTING (CABG) X THREE, USING ENDOSCOPICALLY HARVESTED RIGHT GREATER SAPHENOUS VEIN  AND LEFT ATRIAL APPENDAGE CLIPPING;  Surgeon: Murriel Toribio DEL, MD;  Location: [facility] OR;  Service: Open Heart Surgery;  Laterality: N/A;   CORONARY STENT INTERVENTION N/A 01/24/2020   Procedure: CORONARY STENT INTERVENTION;  Surgeon: Darron Deatrice LABOR, MD;  Location: [facility];  Service: Cardiovascular;  Laterality: N/A;   CORONARY STENT INTERVENTION N/A 05/20/2021   Procedure: CORONARY STENT INTERVENTION;  Surgeon: Darron Deatrice LABOR, MD;  Location: [facility];  Service: Cardiovascular;  Laterality: N/A;   CORONARY ULTRASOUND/IVUS N/A 05/20/2021   Procedure: Intravascular Ultrasound/IVUS;  Surgeon: Darron Deatrice LABOR, MD;  Location: [facility];  Service: Cardiovascular;  Laterality: N/A;   IRRIGATION AND DEBRIDEMENT KNEE Left    LEFT ATRIAL APPENDAGE OCCLUSION Left 02/19/2022   Procedure: LEFT ATRIAL APPENDAGE OCCLUSION; Location: [facility]; Surgeon: Toribio Murriel, MD   LEFT HEART CATH AND CORONARY ANGIOGRAPHY N/A 01/24/2020   Procedure: LEFT HEART CATH AND CORONARY ANGIOGRAPHY;  Surgeon: Darron Deatrice  A, MD;  Location: [facility];  Service: Cardiovascular;  Laterality: N/A;   LEFT HEART CATH AND CORONARY ANGIOGRAPHY N/A 11/18/2021   Procedure: LEFT HEART CATH AND CORONARY ANGIOGRAPHY;  Surgeon: Darron Deatrice LABOR, MD;  Location: [facility];  Service: Cardiovascular;  Laterality: N/A;   LEFT HEART CATH AND CORONARY ANGIOGRAPHY N/A 02/13/2022   Procedure: LEFT HEART CATH AND CORONARY ANGIOGRAPHY;  Surgeon: Anner Alm ORN, MD;  Location: [facility];  Service: Cardiovascular;  Laterality: N/A;   LEFT HEART CATH AND CORS/GRAFTS ANGIOGRAPHY N/A 05/20/2021   Procedure: LEFT HEART CATH AND CORS/GRAFTS ANGIOGRAPHY;  Surgeon: Darron Deatrice LABOR, MD;  Location: [facility];  Service: Cardiovascular;  Laterality: N/A;   LEFT HEART CATH  AND CORS/GRAFTS ANGIOGRAPHY N/A 02/03/2023   Procedure: LEFT HEART CATH AND CORS/GRAFTS ANGIOGRAPHY;  Surgeon: Mady Bruckner, MD;  Location: [facility];  Service: Cardiovascular;  Laterality: N/A;   SCROTAL EXPLORATION N/A 03/06/2023   Procedure: EXCISION OF SCROTUM LESION, SEBACEOUS CYST;  Surgeon: Francisca Redell BROCKS, MD;  Location: [facility];  Service: Urology;  Laterality: N/A;   TYMPANOSTOMY TUBE PLACEMENT Bilateral     Family History  Problem Relation Age of Onset   Coronary artery disease Mother    Arrhythmia Mother        Pacemaker placed in 2021   Heart failure Mother    Breast cancer Mother    Coronary artery disease Father    Colon cancer Father    Colon polyps Father     Social History   Socioeconomic History   Marital status: Single    Spouse name: Not on file   Number of children: 3   Years of education: Not on file   Highest education level: Not on file  Occupational History   Not on file  Tobacco Use   Smoking status: Every Day    Current packs/day: 1.00    Average packs/day: 1 pack/day for 10.7 years (10.7 ttl pk-yrs)    Types: Cigarettes    Start date: 02/12/2013    Passive exposure: Past   Smokeless tobacco: Never   Tobacco comments:    2-3 cigarettes daily- 08/06/2022        1/2 pack daily as of 01/2023  Vaping Use   Vaping status: Never Used  Substance and Sexual Activity   Alcohol use: Yes    Alcohol/week: 1.0 standard drink of alcohol    Types: 1 Shots of liquor per week    Comment: socially   Drug use: Yes    Comment: states takes gummies occasionally   Sexual activity: Yes  Other Topics Concern   Not on file  Social History Narrative   Lives with his uncle    Social Drivers of Corporate investment banker Strain: Not on file  Food Insecurity: No Food Insecurity (07/21/2023)   Hunger Vital Sign    Worried About Running Out of Food in the Last Year: Never true    Ran Out of Food in the Last Year: Never true  Transportation  Needs: No Transportation Needs (07/21/2023)   PRAPARE - Administrator, Civil Service (Medical): No    Lack of Transportation (Non-Medical): No  Physical Activity: Not on file  Stress: Not on file  Social Connections: Unknown (08/05/2022)   [facility]   Social Network    Social Network: Not on file  Intimate Partner Violence: Unknown (08/05/2022)   [facility]  HITS    Physically Hurt: Not on file    Insult or Talk Down To: Not on file    Threaten Physical Harm: Not on file    Scream or Curse: Not on file    Outpatient Medications Prior to Visit  Medication Sig Dispense Refill   aspirin  EC 81 MG tablet Take 1 tablet (81 mg total) by mouth daily. Swallow whole. 90 tablet 3   clopidogrel  (PLAVIX ) 75 MG tablet Take 1 tablet (75 mg total) by mouth daily. 90 tablet 3   ezetimibe  (ZETIA ) 10 MG tablet Take 1 tablet (10 mg total) by mouth daily. 90 tablet 3   famotidine  (PEPCID ) 20 MG tablet Take 1 tablet (20 mg total) by mouth at bedtime as needed for heartburn or indigestion. 60 tablet 0   fenofibrate  (TRICOR ) 48 MG tablet Take 1 tablet (48 mg total) by mouth daily. 90 tablet 1   Fluticasone -Umeclidin-Vilant (TRELEGY ELLIPTA ) 100-62.5-25 MCG/ACT AEPB Inhale 1 puff into the lungs daily. 60 each 11   furosemide  (LASIX ) 40 MG tablet Take 1 tablet (40 mg total) by mouth daily. 90 tablet 3   hydrocortisone  (ANUSOL -HC) 25 MG suppository Place 1 suppository (25 mg total) rectally 2 (two) times daily. 12 suppository 0   isosorbide  mononitrate (IMDUR ) 30 MG 24 hr tablet Take 1 tablet (30 mg total) by mouth daily. 30 tablet 11   losartan  (COZAAR ) 50 MG tablet Take 1 tablet (50 mg total) by mouth daily. 90 tablet 3   metoprolol  succinate (TOPROL -XL) 25 MG 24 hr tablet Take 1 tablet (25 mg total) by mouth 2 (two) times daily. 180 tablet 3   nicotine  (NICODERM CQ  - DOSED IN MG/24 HOURS) 14 mg/24hr patch Place 1 patch (14 mg total) onto the skin daily. 42 patch 0    nitroGLYCERIN  (NITROSTAT ) 0.4 MG SL tablet Place 1 tablet (0.4 mg total) under the tongue every 5 (five) minutes as needed for chest pain (if chest pain not resolved after 3 doses, please call 911). 25 tablet PRN   NON FORMULARY Pt uses a bi-pap nightly     pantoprazole  (PROTONIX ) 40 MG tablet Take 1 tablet (40 mg total) by mouth daily. 90 tablet 0   polyethylene glycol (MIRALAX ) 17 g packet Take 17 g by mouth daily as needed. 30 each 1   rosuvastatin  (CRESTOR ) 40 MG tablet Take 1 tablet (40 mg total) by mouth daily. 90 tablet 3   spironolactone  (ALDACTONE ) 25 MG tablet Take 1 tablet (25 mg total) by mouth daily. 90 tablet 3   VENTOLIN  HFA 108 (90 Base) MCG/ACT inhaler Inhale 1-2 puffs into the lungs every 6 (six) hours as needed for wheezing or shortness of breath. 18 each 3   empagliflozin  (JARDIANCE ) 10 MG TABS tablet Take 1 tablet (10 mg total) by mouth daily. (Patient not taking: Reported on 10/28/2023) 90 tablet 3   nicotine  (NICODERM CQ  - DOSED IN MG/24 HR) 7 mg/24hr patch Place 1 patch (7 mg total) onto the skin daily. 14 patch 0   No facility-administered medications prior to visit.    Allergies  Allergen Reactions   Bee Pollen     Patient states he is not allergic to bees but has seasonal allergies to pollen    ROS Review of Systems  Constitutional:  Negative for appetite change, chills, fatigue and fever.  HENT:  Negative for congestion, postnasal drip, rhinorrhea and sneezing.   Respiratory:  Positive for shortness of breath. Negative for cough and wheezing.   Cardiovascular:  Negative  for chest pain, palpitations and leg swelling.  Gastrointestinal:  Negative for abdominal pain, constipation, nausea and vomiting.  Genitourinary:  Negative for difficulty urinating, dysuria, flank pain and frequency.  Musculoskeletal:  Negative for arthralgias, back pain, joint swelling and myalgias.  Skin:  Negative for color change, pallor, rash and wound.  Neurological:  Negative for  dizziness, facial asymmetry, weakness, numbness and headaches.  Psychiatric/Behavioral:  Negative for behavioral problems, confusion, self-injury and suicidal ideas.       Objective:    Physical Exam Vitals and nursing note reviewed.  Constitutional:      General: He is not in acute distress.    Appearance: Normal appearance. He is obese. He is not ill-appearing, toxic-appearing or diaphoretic.  Eyes:     General: No scleral icterus.       Right eye: No discharge.        Left eye: No discharge.     Extraocular Movements: Extraocular movements intact.     Conjunctiva/sclera: Conjunctivae normal.  Cardiovascular:     Rate and Rhythm: Normal rate and regular rhythm.     Pulses: Normal pulses.     Heart sounds: Normal heart sounds. No murmur heard.    No friction rub. No gallop.  Pulmonary:     Effort: Pulmonary effort is normal. No respiratory distress.     Breath sounds: Normal breath sounds. No stridor. No wheezing, rhonchi or rales.  Chest:     Chest wall: No tenderness.  Abdominal:     General: There is no distension.     Palpations: Abdomen is soft.     Tenderness: There is no abdominal tenderness. There is no right CVA tenderness, left CVA tenderness or guarding.  Musculoskeletal:        General: No swelling, tenderness, deformity or signs of injury.     Right lower leg: No edema.     Left lower leg: No edema.  Skin:    General: Skin is warm and dry.     Capillary Refill: Capillary refill takes 2 to 3 seconds.     Coloration: Skin is not jaundiced or pale.     Findings: No bruising, erythema or lesion.  Neurological:     Mental Status: He is alert and oriented to person, place, and time.     Motor: No weakness.     Gait: Gait normal.  Psychiatric:        Mood and Affect: Mood normal.        Behavior: Behavior normal.        Thought Content: Thought content normal.        Judgment: Judgment normal.     BP 116/73   Pulse 70   Wt 244 lb 6.4 oz (110.9 kg)   SpO2  98%   BMI 33.15 kg/m  Wt Readings from Last 3 Encounters:  10/28/23 244 lb 6.4 oz (110.9 kg)  09/03/23 242 lb 11.2 oz (110.1 kg)  08/24/23 236 lb 12.8 oz (107.4 kg)    No results found for: TSH Lab Results  Component Value Date   WBC 8.1 07/17/2023   HGB 15.3 07/17/2023   HCT 46.8 07/17/2023   MCV 81.7 07/17/2023   PLT 305 07/17/2023   Lab Results  Component Value Date   NA 140 07/17/2023   K 3.6 07/17/2023   CO2 25 07/17/2023   GLUCOSE 110 (H) 07/17/2023   BUN 7 07/17/2023   CREATININE 1.29 (H) 07/17/2023   BILITOT 0.7 07/17/2023   ALKPHOS 82  07/17/2023   AST 30 07/17/2023   ALT 25 07/17/2023   PROT 7.4 07/17/2023   ALBUMIN  3.8 07/17/2023   CALCIUM  9.0 07/17/2023   ANIONGAP 10 07/17/2023   EGFR 61 01/21/2023   Lab Results  Component Value Date   CHOL 106 09/07/2023   Lab Results  Component Value Date   HDL 39 (L) 09/07/2023   Lab Results  Component Value Date   LDLCALC 40 09/07/2023   Lab Results  Component Value Date   TRIG 158 (H) 09/07/2023   Lab Results  Component Value Date   CHOLHDL 2.7 09/07/2023   Lab Results  Component Value Date   HGBA1C 5.2 04/14/2022      Assessment & Plan:   Problem List Items Addressed This Visit       Cardiovascular and Mediastinum   CAD (coronary artery disease)   Continue Plavix  75 mg daily, aspirin  81 mg daily, Crestor  40 mg daily, Zetia  10 mg daily .      Chronic diastolic congestive heart failure (HCC) - Primary    Chronic diastolic heart failure with non-adherence to Jardiance . Shortness of breath noted, no chest pain or edema. - Instructed to pick up Jardiance  10 mg tablets from [facility]. continue metoprolol  25 mg daily, losartan  50 mg daily, spironolactone  25 mg daily, furosemide  40 mg daily, Farxiga  10 mg daily.  - Advised to stay active and exercise as tolerated. - Recommended a heart-healthy, low-salt, low-fat diet.        Respiratory   Other emphysema (HCC)    Emphysema  likely due to smoking. Shortness of breath present. Missed pulmonology appointment. - Continue albuterol  inhaler as needed, two puffs every six hours. - Continue Trelegy, one puff daily. - Advised to reschedule missed pulmonology appointment. - Instructed to monitor cough and report if it worsens or if fever and chills develop.        Other   Tobacco use   Nicotine  dependence, current, with use of nicotine  patch Nicotine  dependence with reduced smoking due to nicotine  patch. Occasional smoking when patch unavailable. - Continue 14 mg nicotine  patches. - Encouraged complete smoking cessation. - Advised to ensure availability of patches to prevent smoking relapse.      Hyperlipidemia   Lab Results  Component Value Date   CHOL 106 09/07/2023   HDL 39 (L) 09/07/2023   LDLCALC 40 09/07/2023   TRIG 158 (H) 09/07/2023   CHOLHDL 2.7 09/07/2023  Continue Crestor  40 mg daily, Zetia  10 mg daily       No orders of the defined types were placed in this encounter.   Follow-up: Return in about 4 months (around 02/27/2024) for CPE.    Rachell Druckenmiller R Sherrill Buikema, FNP

## 2023-10-28 NOTE — Assessment & Plan Note (Signed)
 Lab Results  Component Value Date   CHOL 106 09/07/2023   HDL 39 (L) 09/07/2023   LDLCALC 40 09/07/2023   TRIG 158 (H) 09/07/2023   CHOLHDL 2.7 09/07/2023  Continue Crestor  40 mg daily, Zetia  10 mg daily

## 2023-10-29 ENCOUNTER — Other Ambulatory Visit: Payer: Self-pay

## 2023-10-29 ENCOUNTER — Other Ambulatory Visit (HOSPITAL_BASED_OUTPATIENT_CLINIC_OR_DEPARTMENT_OTHER): Payer: Self-pay

## 2023-10-29 ENCOUNTER — Other Ambulatory Visit (HOSPITAL_COMMUNITY): Payer: Self-pay

## 2023-11-02 ENCOUNTER — Other Ambulatory Visit (HOSPITAL_COMMUNITY): Payer: Self-pay

## 2023-11-03 ENCOUNTER — Encounter: Payer: Self-pay | Admitting: Physician Assistant

## 2023-11-03 ENCOUNTER — Ambulatory Visit: Admitting: Physician Assistant

## 2023-11-03 VITALS — BP 141/96

## 2023-11-03 DIAGNOSIS — A630 Anogenital (venereal) warts: Secondary | ICD-10-CM | POA: Diagnosis not present

## 2023-11-03 NOTE — Progress Notes (Signed)
   Follow-Up Visit   Subjective  Logan Crawford is a 48 y.o. male ESTABLISHED PATIENT who presents for the following: Condyloma follow up.   Patient present today for follow up visit for Condyloma. Patient was last evaluated on 10/05/23. At this visit patient had cryotherapy. Patient reports sxs are better. Patient reports medication changes, he is back taking Jardiance  .  Patient states he has a few skin tags on his neck he would like looked at today  The following portions of the chart were reviewed this encounter and updated as appropriate: medications, allergies, medical history  Review of Systems:  No other skin or systemic complaints except as noted in HPI or Assessment and Plan.  Objective  Well appearing patient in no apparent distress; mood and affect are within normal limits.     A focused examination was performed of the following areas: Groin  Relevant exam findings are noted in the Assessment and Plan.  penis (4) Flesh colored verrucous papules on penis   Assessment & Plan    CONDYLOMA ACCUMATUM  - some resolved - some new  - crytherapy today with f/u in 3-4 weeks    CONDYLOMA (4) penis (4) Destruction of lesion - penis (4) Complexity: simple   Destruction method: cryotherapy   Informed consent: discussed and consent obtained   Timeout:  patient name, date of birth, surgical site, and procedure verified Lesion destroyed using liquid nitrogen: Yes   Region frozen until ice ball extended beyond lesion: Yes   Outcome: patient tolerated procedure well with no complications   Post-procedure details: wound care instructions given     Return in about 3 weeks (around 11/24/2023) for Condyloma follow up.  I, Doyce Pan, CMA, am acting as scribe for Ariany Kesselman K, PA-C.   Documentation: I have reviewed the above documentation for accuracy and completeness, and I agree with the above.  Jettie Mannor K, PA-C

## 2023-11-03 NOTE — Patient Instructions (Signed)

## 2023-11-14 NOTE — Progress Notes (Deleted)
 Cardiology Clinic Note   Date: 11/14/2023 ID: Refujio, Haymer [birth year], MRN [id number]  Primary Cardiologist:  Redell Cave, MD  Chief Complaint   Logan Crawford is a 48 y.o. male who presents to the clinic today for ***  Patient Profile   Logan Crawford is followed by *** for the history outlined below.      Past medical history significant for: CAD. LHC 01/24/2020 (NSTEMI): Ostial to proximal RCA 60%.  Proximal to mid RCA 40%.  Mid LAD 90%.  PCI with DES 2.75 x 22 mm to mid LAD.  Mildly reduced LV systolic function with EF 45% with moderate to severe anteroapical hypokinesis.  Moderately elevated LVEDP. LHC 05/20/2021 (NSTEMI): Distal LAD 40%.  Proximal to mid RCA 40%.  Ostial proximal RCA 50%.  RPDA 40%.  Ostial LM 30%.  Mid LAD 85% distal edge in-stent restenosis with extensive plaque distal to the stent involving the whole mid segment.  D2 60%.  PCI with DES 2.75 x 34 mm to mid LAD.  Balloon angioplasty to ostial D2. LHC 11/18/2021 (unstable angina): Ostial 30%.  Distal LAD 40%.  Ostial to proximal RCA 50%.  Proximal mid RCA 40%.  D2 30%.  RPDA 40%.  Mid LAD #1 80%, #3 60%, mid RCA 70%.  Patent overlapping LAD stents with evidence of significant in-stent restenosis in the proximal segment with borderline stenosis distal to the stent.  Recommended CABG versus balloon angioplasty given patient's poor compliance with medical therapy and continued cocaine use. LHC 02/13/2022: Proximal and distal LAD stent: Proximal to mid LAD 70%, jailed D2 80%.  Mid LAD #1 80%, #2 50%, distal LAD had distal stent edge 99%.  Mid RCA #1 70%, #2 95%.  RVEF 60%.  RPDA 60%.  Recommend transfer to [facility] for CTS consult. CABG x 3 02/19/2022: LIMA to diagonal, SVG to LAD, SVG to RCA.  LAA closure. Nuclear stress test 01/27/2023: Abnormal myocardial perfusion test, intermediate risk study.  Moderate in size, moderate severity reversible defect involving the apex concerning for ischemia but cannot  exclude an element of artifact. LHC 02/03/2023 (abnormal stress test): Severe two-vessel native CAD with up to 99% in-stent restenosis of the LAD and multifocal RCA disease of up to 95%.  Overall.  Similar to pre-CABG native CAD.  Widely patent LIMA to D2 and SVG to RCA.  Occluded SVG to LAD.  Recommend addition of isosorbide .  If refractory symptoms consider PTCA/PCI of LAD in-stent restenosis. Chronic diastolic heart failure. Echo 02/12/2023: EF 55 to 60%.  No RWMA.  Mild LVH.  Grade II DD.  Normal RV size/function.  Mild MR.   Hypertension. Hyperlipidemia. Lipid panel 09/07/2023: LDL 40, HDL 39, TG 158, total 106. GERD. Emphysema. OSA.  CKD stage IIIa. Tobacco abuse. Polysubstance abuse. Medication nonadherence.  In summary, patient has an extensive cardiac history with multiple PCI and CABG as detailed.  He was admitted to the hospital in October 2021 with progressive weakness, fatigue, dyspnea, cough, diarrhea.  He was found to be COVID-positive.  Troponin peaked at 32.  Echo showed EF 50 to 55%, no RWMA, Grade I DD, normal RV size and function, no valvular abnormalities.  He was readmitted November 2021 with NSTEMI, troponin peaked at 3520.  Echo at that time showed EF 50%, moderate hypokinesis of the mid apical anteroseptal wall, mild LVH, diastolic dysfunction, normal RV size/function, mild aortic valve sclerosis without stenosis.  He underwent successful PCI with DES to mid LAD.  Lexiscan  October 2022 showed  a small region of ischemia in the apical wall MI is overall low risk.    He was admitted to the hospital March 2023 with chest pain, troponin peaked at 125.  UDS positive for cocaine.  Echo showed normal LV/RV function, hypokinesis of the mid to distal anteroseptal/septal and apical region, moderate LVH, Grade I DD, mild MR, aortic valve sclerosis without stenosis.  LHC demonstrated in-stent restenosis as detailed above patient underwent PCI with DES and balloon angioplasty.    He was  admitted September 2023 with unstable angina.  He had not been taking his medications.  UDS positive for cocaine.  Echo showed EF 60 to 65%, no RWMA, moderate concentric LVH, Grade I DD, normal RV size/function, trivial MR, aortic valve sclerosis without stenosis.  LHC showed significant in-stent restenosis in the proximal segment of overlapping LAD stents.  IC NTG improved overall appearance of coronary arteries and intervention was deferred secondary to patient's poor compliance and continued cocaine use.  Antianginal therapy was optimized with plan for revascularization if patient remains symptomatic.  He was admitted to the hospital December 2023 with NSTEMI in the setting of continued cocaine use and nonadherence with antiplatelet therapy.  Echo showed normal LV/RV function, no RWMA, mild MR.  LHC showed 2 potential culprit lesion including mid to distal RCA and mid to distal LAD.  He was transferred to [facility] and underwent CABG x 3.  Intraoperative course complicated by hyperkalemia bradycardic arrest prior to initiation of cardiopulmonary bypass with requirement of IABP as well as inotropic and vasopressor support which was continued postoperatively.  Posthospitalization course complicated by left pleural effusion requiring thoracentesis.    Patient was seen for preop evaluation in November 2024.  He reported occasional chest pain and more dyspnea with minimal exertion over a couple of months.  Patient reported inadvertently not taking metoprolol  and losartan  x 2 months he restarted losartan  but wanted to be sure he should take metoprolol .  He reported using cocaine the month prior.  EKG demonstrated more prominent inverted T waves in lateral leads concerning for possible ischemia.  He had an abnormal, intermediate risk nuclear stress test as detailed above.  He then underwent LHC which demonstrated severe two-vessel native CAD and occluded SVG to LAD as detailed above.  He was started on isosorbide .   Upon follow-up in December 2024 he reported improvement in symptoms since starting isosorbide .  He was cleared for his surgery which was performed on 03/06/2023.  Patient was last seen in the office by me on 05/14/2023 for routine follow-up.  He was doing well at that time.  He was exercising and using his BiPAP regularly.  He was working on quitting smoking and had not used cocaine for several months.  Patient was seen by Dr. Cherrie in advanced heart failure clinic on 06/01/2023 for routine follow-up.  Patient reported postprandial chest pain that was felt to be noncardiac.  He was euvolemic.  Abdominal ultrasound was ordered to evaluate patient's postprandial abdominal pain.  There was no evidence of gallstones and demonstrated findings suspicious for fatty liver.     History of Present Illness    Today, patient ***  CAD Extensive history with multiple PCI. CABG x 26 January 2022. Patient presented for pre-op evaluation and EKG showed more prominent T-Wave inversion in lateral leads when compared to prior EKG. In December 2024, he complained of more dyspnea with activity Lexiscan  showed evidence of ischemia. He underwent LHC which showed severe two-vessel native CAD with up to  99% in-stent restenosis of the LAD and multifocal RCA disease up to 95%.  Patent LIMA to D2 and SVG to RCA, occluded SVG to LAD.  Patient*** -Continue to advance physical activity as tolerated. -Continue aspirin , Plavix , rosuvastatin , Toprol , isosorbide , as needed SL NTG.   Chronic diastolic heart failure Echo December 2024 showed normal LV/RV function, mild LVH, Grade II DD, mild MR.  Patient***.  Euvolemic and well compensated on exam. -Continue Jardiance , furosemide , spironolactone , isosorbide , Toprol  and losartan .   Hypertension BP today***. No dizziness or headaches reported.   -Continue losartan , Toprol , spironolactone .   Hyperlipidemia LDL 40 July 2025, at goal. -Continue rosuvastatin .   Polysubstance  abuse Patient*** -Encouraged continued abstinence of illicit drugs.  -Congratulated on cutting back on cigarettes. Encouraged complete cessation.    OSA Patient*** -Continue adherence with BiPAP.   ROS: All other systems reviewed and are otherwise negative except as noted in History of Present Illness.  EKGs/Labs Reviewed        07/17/2023: ALT 25; AST 30; BUN 7; Creatinine, Ser 1.29; Potassium 3.6; Sodium 140   07/17/2023: Hemoglobin 15.3; WBC 8.1   No results found for requested labs within last 365 days.   No results found for requested labs within last 365 days.  ***  Risk Assessment/Calculations    {Does this patient have ATRIAL FIBRILLATION?:[phone]} No BP recorded.  {Refresh Note OR Click here to enter BP  :1}***        Physical Exam    VS:  There were no vitals taken for this visit. , BMI There is no height or weight on file to calculate BMI.  GEN: Well nourished, well developed, in no acute distress. Neck: No JVD or carotid bruits. Cardiac: *** RRR. *** No murmur. No rubs or gallops.   Respiratory:  Respirations regular and unlabored. Clear to auscultation without rales, wheezing or rhonchi. GI: Soft, nontender, nondistended. Extremities: Radials/DP/PT 2+ and equal bilaterally. No clubbing or cyanosis. No edema ***  Skin: Warm and dry, no rash. Neuro: Strength intact.  Assessment & Plan   ***  Disposition: ***     {Are you ordering a CV Procedure (e.g. stress test, cath, DCCV, TEE, etc)?   Press F2        :[id number]}   Signed, Barnie HERO. Deyja Sochacki, DNP, NP-C

## 2023-11-17 ENCOUNTER — Ambulatory Visit: Admitting: Student

## 2023-11-17 NOTE — Progress Notes (Unsigned)
 Cardiology Clinic Note   Date: 11/18/2023 ID: Lorn, Butcher June 03, 1975, MRN [id number]  Primary Cardiologist:  Redell Cave, MD  Chief Complaint   Logan Crawford is a 48 y.o. male who presents to the clinic today for routine follow up.   Patient Profile   Sullivan C Golubski is followed by Dr. Cave for the history outlined below.      Past medical history significant for: CAD. LHC 01/24/2020 (NSTEMI): Ostial to proximal RCA 60%.  Proximal to mid RCA 40%.  Mid LAD 90%.  PCI with DES 2.75 x 22 mm to mid LAD.  Mildly reduced LV systolic function with EF 45% with moderate to severe anteroapical hypokinesis.  Moderately elevated LVEDP. LHC 05/20/2021 (NSTEMI): Distal LAD 40%.  Proximal to mid RCA 40%.  Ostial proximal RCA 50%.  RPDA 40%.  Ostial LM 30%.  Mid LAD 85% distal edge in-stent restenosis with extensive plaque distal to the stent involving the whole mid segment.  D2 60%.  PCI with DES 2.75 x 34 mm to mid LAD.  Balloon angioplasty to ostial D2. LHC 11/18/2021 (unstable angina): Ostial 30%.  Distal LAD 40%.  Ostial to proximal RCA 50%.  Proximal mid RCA 40%.  D2 30%.  RPDA 40%.  Mid LAD #1 80%, #3 60%, mid RCA 70%.  Patent overlapping LAD stents with evidence of significant in-stent restenosis in the proximal segment with borderline stenosis distal to the stent.  Recommended CABG versus balloon angioplasty given patient's poor compliance with medical therapy and continued cocaine use. LHC 02/13/2022: Proximal and distal LAD stent: Proximal to mid LAD 70%, jailed D2 80%.  Mid LAD #1 80%, #2 50%, distal LAD had distal stent edge 99%.  Mid RCA #1 70%, #2 95%.  RVEF 60%.  RPDA 60%.  Recommend transfer to [facility] for CTS consult. CABG x 3 02/19/2022: LIMA to diagonal, SVG to LAD, SVG to RCA.  LAA closure. Nuclear stress test 01/27/2023: Abnormal myocardial perfusion test, intermediate risk study.  Moderate in size, moderate severity reversible defect involving the apex  concerning for ischemia but cannot exclude an element of artifact. LHC 02/03/2023 (abnormal stress test): Severe two-vessel native CAD with up to 99% in-stent restenosis of the LAD and multifocal RCA disease of up to 95%.  Overall.  Similar to pre-CABG native CAD.  Widely patent LIMA to D2 and SVG to RCA.  Occluded SVG to LAD.  Recommend addition of isosorbide .  If refractory symptoms consider PTCA/PCI of LAD in-stent restenosis. Chronic diastolic heart failure. Echo 02/12/2023: EF 55 to 60%.  No RWMA.  Mild LVH.  Grade II DD.  Normal RV size/function.  Mild MR.   Hypertension. Hyperlipidemia. Lipid panel 09/07/2023: LDL 40, HDL 39, TG 158, total 106. GERD. Emphysema. OSA.  CKD stage IIIa. Tobacco abuse. Polysubstance abuse. Medication nonadherence.  In summary, patient has an extensive cardiac history with multiple PCI and CABG as detailed.  He was admitted to the hospital in October 2021 with progressive weakness, fatigue, dyspnea, cough, diarrhea.  He was found to be COVID-positive.  Troponin peaked at 32.  Echo showed EF 50 to 55%, no RWMA, Grade I DD, normal RV size and function, no valvular abnormalities.  He was readmitted November 2021 with NSTEMI, troponin peaked at 3520.  Echo at that time showed EF 50%, moderate hypokinesis of the mid apical anteroseptal wall, mild LVH, diastolic dysfunction, normal RV size/function, mild aortic valve sclerosis without stenosis.  He underwent successful PCI with DES to mid LAD.  Lexiscan  October 2022 showed a small region of ischemia in the apical wall MI is overall low risk.    He was admitted to the hospital March 2023 with chest pain, troponin peaked at 125.  UDS positive for cocaine.  Echo showed normal LV/RV function, hypokinesis of the mid to distal anteroseptal/septal and apical region, moderate LVH, Grade I DD, mild MR, aortic valve sclerosis without stenosis.  LHC demonstrated in-stent restenosis as detailed above patient underwent PCI with DES  and balloon angioplasty.    He was admitted September 2023 with unstable angina.  He had not been taking his medications.  UDS positive for cocaine.  Echo showed EF 60 to 65%, no RWMA, moderate concentric LVH, Grade I DD, normal RV size/function, trivial MR, aortic valve sclerosis without stenosis.  LHC showed significant in-stent restenosis in the proximal segment of overlapping LAD stents.  IC NTG improved overall appearance of coronary arteries and intervention was deferred secondary to patient's poor compliance and continued cocaine use.  Antianginal therapy was optimized with plan for revascularization if patient remains symptomatic.  He was admitted to the hospital December 2023 with NSTEMI in the setting of continued cocaine use and nonadherence with antiplatelet therapy.  Echo showed normal LV/RV function, no RWMA, mild MR.  LHC showed 2 potential culprit lesion including mid to distal RCA and mid to distal LAD.  He was transferred to [facility] and underwent CABG x 3.  Intraoperative course complicated by hyperkalemia bradycardic arrest prior to initiation of cardiopulmonary bypass with requirement of IABP as well as inotropic and vasopressor support which was continued postoperatively.  Posthospitalization course complicated by left pleural effusion requiring thoracentesis.    Patient was seen for preop evaluation in November 2024.  He reported occasional chest pain and more dyspnea with minimal exertion over a couple of months.  Patient reported inadvertently not taking metoprolol  and losartan  x 2 months he restarted losartan  but wanted to be sure he should take metoprolol .  He reported using cocaine the month prior.  EKG demonstrated more prominent inverted T waves in lateral leads concerning for possible ischemia.  He had an abnormal, intermediate risk nuclear stress test as detailed above.  He then underwent LHC which demonstrated severe two-vessel native CAD and occluded SVG to LAD as detailed  above.  He was started on isosorbide .  Upon follow-up in December 2024 he reported improvement in symptoms since starting isosorbide .  He was cleared for his surgery which was performed on 03/06/2023.  Patient was last seen in the office by me on 05/14/2023 for routine follow-up.  He was doing well at that time.  He was exercising and using his BiPAP regularly.  He was working on quitting smoking and had not used cocaine for several months.  Patient was seen by Dr. Cherrie in advanced heart failure clinic on 06/01/2023 for routine follow-up.  Patient reported postprandial chest pain that was felt to be noncardiac.  He was euvolemic.  Abdominal ultrasound was ordered to evaluate patient's postprandial abdominal pain.  There was no evidence of gallstones and demonstrated findings suspicious for fatty liver.     History of Present Illness    Today, patient reports increased fatigue with episodes of chest pain over the last couple of weeks. He reports he had been going to the gym regularly with rare, brief episodes of chest pain. Since the beginning of September he began feeling more fatigued to the point that he stopped going to the gym. He then started having episodes  of exertional heavy, dull central chest discomfort that eases with rest and is associated with shortness of breath. Episodes are occurring 3-4 times a week. They typically last minutes but he had an episode one day ago after getting out of the shower that lasted a few hours. He has taken prn NTG a few times with some relief. He was inconsistently using BiPAP but began using it regularly when symptoms started. He continues to smoke with a pack lasting 3-4 days. He has not used cocaine for at least 7-8 months. He denies lower extremity edema but reports abdominal fullness. He eats a high sodium diet with mainly processed foods and meals from restaurants.     ROS: All other systems reviewed and are otherwise negative except as noted in History of  Present Illness.  EKGs/Labs Reviewed    EKG Interpretation Date/Time:  Wednesday November 18 2023 14:56:21 EDT Ventricular Rate:  71 PR Interval:  166 QRS Duration:  74 QT Interval:  424 QTC Calculation: 460 R Axis:   17  Text Interpretation: Normal sinus rhythm Inferior infarct , age undetermined Cannot rule out Anterior infarct , age undetermined ST & Marked T wave abnormality, consider lateral ischemia When compared with ECG of 01/21/2023 Similar abnormalities seen however, changed from 07/19/2023 Reconfirmed by Loistine Sober [phone]) on 11/18/2023 3:12:53 PM   07/17/2023: ALT 25; AST 30; BUN 7; Creatinine, Ser 1.29; Potassium 3.6; Sodium 140   07/17/2023: Hemoglobin 15.3; WBC 8.1    Physical Exam    VS:  BP 96/74   Pulse 71   Ht 6' (1.829 m)   Wt 242 lb (109.8 kg)   SpO2 95%   BMI 32.82 kg/m  , BMI Body mass index is 32.82 kg/m.  GEN: Well nourished, well developed, in no acute distress. Neck: No JVD or carotid bruits. Cardiac:  RRR.  No murmur. No rubs or gallops.   Respiratory:  Respirations regular and unlabored. Clear to auscultation without rales, wheezing or rhonchi. GI: Soft, nontender, nondistended. Extremities: Radials/DP/PT 2+ and equal bilaterally. No clubbing or cyanosis. No edema.  Skin: Warm and dry, no rash. Neuro: Strength intact.  Assessment & Plan   CAD/unstable angina Extensive history with multiple PCI. CABG x 26 January 2022. Patient presented for pre-op evaluation and EKG showed more prominent T-Wave inversion in lateral leads when compared to prior EKG. In December 2024, he complained of more dyspnea with activity Lexiscan  showed evidence of ischemia. He underwent LHC which showed severe two-vessel native CAD with up to 99% in-stent restenosis of the LAD and multifocal RCA disease up to 95%.  Patent LIMA to D2 and SVG to RCA, occluded SVG to LAD.  Patient reports episodes of dull, heavy central exertional chest pain occurring 3-4 times a week  since the beginning of September with associated shortness of breath and fatigue. He has taken prn SL NTG a few times with relief of symptoms. Symptoms will ease with rest. EKG today is similar to EKG in November prior to abnormal stress test and LHC but different from improved EKG from May. Given symptoms patient agrees to proceed with LHC. Strict ED precautions given.  - Continue aspirin , Plavix , rosuvastatin , Toprol , isosorbide , as needed SL NTG. - Schedule LHC.  - CBC and BMP today.    Chronic diastolic heart failure Echo December 2024 showed normal LV/RV function, mild LVH, Grade II DD, mild MR.  Patient reports increased dyspnea associated with chest discomfort as above. No lower extremity edema. He does report some abdominal fullness.  His diet mostly consists of processed and restaurant foods.  Euvolemic and well compensated on exam. -Continue Jardiance , furosemide , spironolactone , isosorbide , Toprol  and losartan . - Would benefit from repeat echo. Can discuss at follow up.    Hypertension BP today 96/74 on intake and 100/70 on my recheck. No dizziness or headaches reported.   -Continue losartan , Toprol , spironolactone .   Hyperlipidemia LDL 40 July 2025, at goal. -Continue rosuvastatin .   Polysubstance abuse Patient has not used cocaine for 7-8 months at least. He continues to smoke with a pack lasting 3-4 days.  -Encouraged continued abstinence of illicit drugs.  -Encouraged complete cessation.    OSA Patient reports inconsistence adherence to BiPAP. He has started using it more regularly since chest pain started.  - Encouraged consistent adherence with BiPAP.   Disposition: CBC and BMP today. LHC. Return in 3-4 weeks or sooner as needed.      Informed Consent   Shared Decision Making/Informed Consent The risks [stroke (1 in 1000), death (1 in 1000), kidney failure [usually temporary] (1 in 500), bleeding (1 in 200), allergic reaction [possibly serious] (1 in 200)], benefits  (diagnostic support and management of coronary artery disease) and alternatives of a cardiac catheterization were discussed in detail with Mr. Tappan and he is willing to proceed.      Signed, Barnie HERO. Lakiya Cottam, DNP, NP-C

## 2023-11-17 NOTE — H&P (View-Only) (Signed)
 Cardiology Clinic Note   Date: 11/18/2023 ID: Fernie, Grimm December 30, 1975, MRN [id number]  Primary Cardiologist:  Redell Cave, MD  Chief Complaint   Logan Crawford is a 48 y.o. male who presents to the clinic today for routine follow up.   Patient Profile   Logan Crawford is followed by Dr. Cave for the history outlined below.      Past medical history significant for: CAD. LHC 01/24/2020 (NSTEMI): Ostial to proximal RCA 60%.  Proximal to mid RCA 40%.  Mid LAD 90%.  PCI with DES 2.75 x 22 mm to mid LAD.  Mildly reduced LV systolic function with EF 45% with moderate to severe anteroapical hypokinesis.  Moderately elevated LVEDP. LHC 05/20/2021 (NSTEMI): Distal LAD 40%.  Proximal to mid RCA 40%.  Ostial proximal RCA 50%.  RPDA 40%.  Ostial LM 30%.  Mid LAD 85% distal edge in-stent restenosis with extensive plaque distal to the stent involving the whole mid segment.  D2 60%.  PCI with DES 2.75 x 34 mm to mid LAD.  Balloon angioplasty to ostial D2. LHC 11/18/2021 (unstable angina): Ostial 30%.  Distal LAD 40%.  Ostial to proximal RCA 50%.  Proximal mid RCA 40%.  D2 30%.  RPDA 40%.  Mid LAD #1 80%, #3 60%, mid RCA 70%.  Patent overlapping LAD stents with evidence of significant in-stent restenosis in the proximal segment with borderline stenosis distal to the stent.  Recommended CABG versus balloon angioplasty given patient's poor compliance with medical therapy and continued cocaine use. LHC 02/13/2022: Proximal and distal LAD stent: Proximal to mid LAD 70%, jailed D2 80%.  Mid LAD #1 80%, #2 50%, distal LAD had distal stent edge 99%.  Mid RCA #1 70%, #2 95%.  RVEF 60%.  RPDA 60%.  Recommend transfer to [facility] for CTS consult. CABG x 3 02/19/2022: LIMA to diagonal, SVG to LAD, SVG to RCA.  LAA closure. Nuclear stress test 01/27/2023: Abnormal myocardial perfusion test, intermediate risk study.  Moderate in size, moderate severity reversible defect involving the apex  concerning for ischemia but cannot exclude an element of artifact. LHC 02/03/2023 (abnormal stress test): Severe two-vessel native CAD with up to 99% in-stent restenosis of the LAD and multifocal RCA disease of up to 95%.  Overall.  Similar to pre-CABG native CAD.  Widely patent LIMA to D2 and SVG to RCA.  Occluded SVG to LAD.  Recommend addition of isosorbide .  If refractory symptoms consider PTCA/PCI of LAD in-stent restenosis. Chronic diastolic heart failure. Echo 02/12/2023: EF 55 to 60%.  No RWMA.  Mild LVH.  Grade II DD.  Normal RV size/function.  Mild MR.   Hypertension. Hyperlipidemia. Lipid panel 09/07/2023: LDL 40, HDL 39, TG 158, total 106. GERD. Emphysema. OSA.  CKD stage IIIa. Tobacco abuse. Polysubstance abuse. Medication nonadherence.  In summary, patient has an extensive cardiac history with multiple PCI and CABG as detailed.  He was admitted to the hospital in October 2021 with progressive weakness, fatigue, dyspnea, cough, diarrhea.  He was found to be COVID-positive.  Troponin peaked at 32.  Echo showed EF 50 to 55%, no RWMA, Grade I DD, normal RV size and function, no valvular abnormalities.  He was readmitted November 2021 with NSTEMI, troponin peaked at 3520.  Echo at that time showed EF 50%, moderate hypokinesis of the mid apical anteroseptal wall, mild LVH, diastolic dysfunction, normal RV size/function, mild aortic valve sclerosis without stenosis.  He underwent successful PCI with DES to mid LAD.  Lexiscan  October 2022 showed a small region of ischemia in the apical wall MI is overall low risk.    He was admitted to the hospital March 2023 with chest pain, troponin peaked at 125.  UDS positive for cocaine.  Echo showed normal LV/RV function, hypokinesis of the mid to distal anteroseptal/septal and apical region, moderate LVH, Grade I DD, mild MR, aortic valve sclerosis without stenosis.  LHC demonstrated in-stent restenosis as detailed above patient underwent PCI with DES  and balloon angioplasty.    He was admitted September 2023 with unstable angina.  He had not been taking his medications.  UDS positive for cocaine.  Echo showed EF 60 to 65%, no RWMA, moderate concentric LVH, Grade I DD, normal RV size/function, trivial MR, aortic valve sclerosis without stenosis.  LHC showed significant in-stent restenosis in the proximal segment of overlapping LAD stents.  IC NTG improved overall appearance of coronary arteries and intervention was deferred secondary to patient's poor compliance and continued cocaine use.  Antianginal therapy was optimized with plan for revascularization if patient remains symptomatic.  He was admitted to the hospital December 2023 with NSTEMI in the setting of continued cocaine use and nonadherence with antiplatelet therapy.  Echo showed normal LV/RV function, no RWMA, mild MR.  LHC showed 2 potential culprit lesion including mid to distal RCA and mid to distal LAD.  He was transferred to [facility] and underwent CABG x 3.  Intraoperative course complicated by hyperkalemia bradycardic arrest prior to initiation of cardiopulmonary bypass with requirement of IABP as well as inotropic and vasopressor support which was continued postoperatively.  Posthospitalization course complicated by left pleural effusion requiring thoracentesis.    Patient was seen for preop evaluation in November 2024.  He reported occasional chest pain and more dyspnea with minimal exertion over a couple of months.  Patient reported inadvertently not taking metoprolol  and losartan  x 2 months he restarted losartan  but wanted to be sure he should take metoprolol .  He reported using cocaine the month prior.  EKG demonstrated more prominent inverted T waves in lateral leads concerning for possible ischemia.  He had an abnormal, intermediate risk nuclear stress test as detailed above.  He then underwent LHC which demonstrated severe two-vessel native CAD and occluded SVG to LAD as detailed  above.  He was started on isosorbide .  Upon follow-up in December 2024 he reported improvement in symptoms since starting isosorbide .  He was cleared for his surgery which was performed on 03/06/2023.  Patient was last seen in the office by me on 05/14/2023 for routine follow-up.  He was doing well at that time.  He was exercising and using his BiPAP regularly.  He was working on quitting smoking and had not used cocaine for several months.  Patient was seen by Dr. Cherrie in advanced heart failure clinic on 06/01/2023 for routine follow-up.  Patient reported postprandial chest pain that was felt to be noncardiac.  He was euvolemic.  Abdominal ultrasound was ordered to evaluate patient's postprandial abdominal pain.  There was no evidence of gallstones and demonstrated findings suspicious for fatty liver.     History of Present Illness    Today, patient reports increased fatigue with episodes of chest pain over the last couple of weeks. He reports he had been going to the gym regularly with rare, brief episodes of chest pain. Since the beginning of September he began feeling more fatigued to the point that he stopped going to the gym. He then started having episodes  of exertional heavy, dull central chest discomfort that eases with rest and is associated with shortness of breath. Episodes are occurring 3-4 times a week. They typically last minutes but he had an episode one day ago after getting out of the shower that lasted a few hours. He has taken prn NTG a few times with some relief. He was inconsistently using BiPAP but began using it regularly when symptoms started. He continues to smoke with a pack lasting 3-4 days. He has not used cocaine for at least 7-8 months. He denies lower extremity edema but reports abdominal fullness. He eats a high sodium diet with mainly processed foods and meals from restaurants.     ROS: All other systems reviewed and are otherwise negative except as noted in History of  Present Illness.  EKGs/Labs Reviewed    EKG Interpretation Date/Time:  Wednesday November 18 2023 14:56:21 EDT Ventricular Rate:  71 PR Interval:  166 QRS Duration:  74 QT Interval:  424 QTC Calculation: 460 R Axis:   17  Text Interpretation: Normal sinus rhythm Inferior infarct , age undetermined Cannot rule out Anterior infarct , age undetermined ST & Marked T wave abnormality, consider lateral ischemia When compared with ECG of 01/21/2023 Similar abnormalities seen however, changed from 07/19/2023 Reconfirmed by Loistine Sober [phone]) on 11/18/2023 3:12:53 PM   07/17/2023: ALT 25; AST 30; BUN 7; Creatinine, Ser 1.29; Potassium 3.6; Sodium 140   07/17/2023: Hemoglobin 15.3; WBC 8.1    Physical Exam    VS:  BP 96/74   Pulse 71   Ht 6' (1.829 m)   Wt 242 lb (109.8 kg)   SpO2 95%   BMI 32.82 kg/m  , BMI Body mass index is 32.82 kg/m.  GEN: Well nourished, well developed, in no acute distress. Neck: No JVD or carotid bruits. Cardiac:  RRR.  No murmur. No rubs or gallops.   Respiratory:  Respirations regular and unlabored. Clear to auscultation without rales, wheezing or rhonchi. GI: Soft, nontender, nondistended. Extremities: Radials/DP/PT 2+ and equal bilaterally. No clubbing or cyanosis. No edema.  Skin: Warm and dry, no rash. Neuro: Strength intact.  Assessment & Plan   CAD/unstable angina Extensive history with multiple PCI. CABG x 26 January 2022. Patient presented for pre-op evaluation and EKG showed more prominent T-Wave inversion in lateral leads when compared to prior EKG. In December 2024, he complained of more dyspnea with activity Lexiscan  showed evidence of ischemia. He underwent LHC which showed severe two-vessel native CAD with up to 99% in-stent restenosis of the LAD and multifocal RCA disease up to 95%.  Patent LIMA to D2 and SVG to RCA, occluded SVG to LAD.  Patient reports episodes of dull, heavy central exertional chest pain occurring 3-4 times a week  since the beginning of September with associated shortness of breath and fatigue. He has taken prn SL NTG a few times with relief of symptoms. Symptoms will ease with rest. EKG today is similar to EKG in November prior to abnormal stress test and LHC but different from improved EKG from May. Given symptoms patient agrees to proceed with LHC. Strict ED precautions given.  - Continue aspirin , Plavix , rosuvastatin , Toprol , isosorbide , as needed SL NTG. - Schedule LHC.  - CBC and BMP today.    Chronic diastolic heart failure Echo December 2024 showed normal LV/RV function, mild LVH, Grade II DD, mild MR.  Patient reports increased dyspnea associated with chest discomfort as above. No lower extremity edema. He does report some abdominal fullness.  His diet mostly consists of processed and restaurant foods.  Euvolemic and well compensated on exam. -Continue Jardiance , furosemide , spironolactone , isosorbide , Toprol  and losartan . - Would benefit from repeat echo. Can discuss at follow up.    Hypertension BP today 96/74 on intake and 100/70 on my recheck. No dizziness or headaches reported.   -Continue losartan , Toprol , spironolactone .   Hyperlipidemia LDL 40 July 2025, at goal. -Continue rosuvastatin .   Polysubstance abuse Patient has not used cocaine for 7-8 months at least. He continues to smoke with a pack lasting 3-4 days.  -Encouraged continued abstinence of illicit drugs.  -Encouraged complete cessation.    OSA Patient reports inconsistence adherence to BiPAP. He has started using it more regularly since chest pain started.  - Encouraged consistent adherence with BiPAP.   Disposition: CBC and BMP today. LHC. Return in 3-4 weeks or sooner as needed.      Informed Consent   Shared Decision Making/Informed Consent The risks [stroke (1 in 1000), death (1 in 1000), kidney failure [usually temporary] (1 in 500), bleeding (1 in 200), allergic reaction [possibly serious] (1 in 200)], benefits  (diagnostic support and management of coronary artery disease) and alternatives of a cardiac catheterization were discussed in detail with Mr. Mcenroe and he is willing to proceed.      Signed, Barnie HERO. Keiri Solano, DNP, NP-C

## 2023-11-18 ENCOUNTER — Encounter: Payer: Self-pay | Admitting: Student

## 2023-11-18 ENCOUNTER — Ambulatory Visit: Attending: Student | Admitting: Student

## 2023-11-18 VITALS — BP 96/74 | HR 71 | Ht 72.0 in | Wt 242.0 lb

## 2023-11-18 DIAGNOSIS — I10 Essential (primary) hypertension: Secondary | ICD-10-CM | POA: Diagnosis not present

## 2023-11-18 DIAGNOSIS — G4733 Obstructive sleep apnea (adult) (pediatric): Secondary | ICD-10-CM

## 2023-11-18 DIAGNOSIS — I5032 Chronic diastolic (congestive) heart failure: Secondary | ICD-10-CM

## 2023-11-18 DIAGNOSIS — I25110 Atherosclerotic heart disease of native coronary artery with unstable angina pectoris: Secondary | ICD-10-CM | POA: Diagnosis not present

## 2023-11-18 DIAGNOSIS — Z01810 Encounter for preprocedural cardiovascular examination: Secondary | ICD-10-CM

## 2023-11-18 DIAGNOSIS — F1910 Other psychoactive substance abuse, uncomplicated: Secondary | ICD-10-CM | POA: Diagnosis present

## 2023-11-18 DIAGNOSIS — E785 Hyperlipidemia, unspecified: Secondary | ICD-10-CM | POA: Diagnosis present

## 2023-11-18 DIAGNOSIS — I110 Hypertensive heart disease with heart failure: Secondary | ICD-10-CM | POA: Diagnosis not present

## 2023-11-18 NOTE — Patient Instructions (Signed)
 Medication Instructions:  Your physician recommends that you continue on your current medications as directed. Please refer to the Current Medication list given to you today.   *If you need a refill on your cardiac medications before your next appointment, please call your pharmacy*  Lab Work: Your provider would like for you to have following labs drawn today BMet, CBC.   If you have labs (blood work) drawn today and your tests are completely normal, you will receive your results only by: MyChart Message (if you have MyChart) OR A paper copy in the mail If you have any lab test that is abnormal or we need to change your treatment, we will call you to review the results.  Testing/Procedures:  [facility] DEPT OF [facility] [place] [address] [place], [address] [place] [address] Dept: [phone] Loc: [phone]  ABDULLOH ULLOM  11/18/2023  You are scheduled for a Cardiac Catheterization on Friday, September 26 with Dr. [name].  1. Please arrive at the [facility] Entrance of [facility], [address], [place] [address] at 11:00 AM (This is 1 hour(s) prior to your procedure time).  Proceed to the Check-In Desk directly inside the entrance.  Procedure Parking: Use the entrance off of the [address] side of the hospital. Turn right upon entering and follow the driveway to parking that is directly in front of the Heart & Vascular Center. There is no valet parking available at this entrance, however there is an awning directly in front of the Heart & Vascular Center for drop off/ pick up for patients.  Special note: Every effort is made to have your procedure done on time. Please understand that emergencies sometimes delay scheduled procedures.  2. Diet: NPO: Nothing to eat OR drink after midnight. (For TEE and Cath the same day)   3. Hydration: You need to be well hydrated before your  procedure. On September 26, you may drink approved liquids (see below) until 2 hours before the procedure, with 16 oz of water  as your last intake.   List of approved liquids water , clear juice, clear tea, black coffee, fruit juices, non-citric and without pulp, carbonated beverages, Gatorade, Kool -Aid, plain Jello-O and plain ice popsicles.  4. Labs: You will need to have blood drawn on Wednesday, September 24 at [facility], Go to 1st desk on your right to register.  Address: [address] [place], [place] [address]  Open: 8am - 5pm  Phone: [phone]. You do not need to be fasting.  5. Medication instructions in preparation for your procedure:   Contrast Allergy: No   Current Outpatient Medications (Endocrine & Metabolic):    empagliflozin  (JARDIANCE ) 10 MG TABS tablet, Take 1 tablet (10 mg total) by mouth daily.  Current Outpatient Medications (Cardiovascular):    ezetimibe  (ZETIA ) 10 MG tablet, Take 1 tablet (10 mg total) by mouth daily.   fenofibrate  (TRICOR ) 48 MG tablet, Take 1 tablet (48 mg total) by mouth daily.   furosemide  (LASIX ) 40 MG tablet, Take 1 tablet (40 mg total) by mouth daily.   isosorbide  mononitrate (IMDUR ) 30 MG 24 hr tablet, Take 1 tablet (30 mg total) by mouth daily.   losartan  (COZAAR ) 50 MG tablet, Take 1 tablet (50 mg total) by mouth daily.   metoprolol  succinate (TOPROL -XL) 25 MG 24 hr tablet, Take 1 tablet (25 mg total) by mouth 2 (two) times daily.   nitroGLYCERIN  (NITROSTAT )  0.4 MG SL tablet, Place 1 tablet (0.4 mg total) under the tongue every 5 (five) minutes as needed for chest pain (if chest pain not resolved after 3 doses, please call 911).   rosuvastatin  (CRESTOR ) 40 MG tablet, Take 1 tablet (40 mg total) by mouth daily.   spironolactone  (ALDACTONE ) 25 MG tablet, Take 1 tablet (25 mg total) by mouth daily.  Current Outpatient Medications (Respiratory):    Fluticasone -Umeclidin-Vilant (TRELEGY ELLIPTA ) 100-62.5-25 MCG/ACT AEPB,  Inhale 1 puff into the lungs daily.   VENTOLIN  HFA 108 (90 Base) MCG/ACT inhaler, Inhale 1-2 puffs into the lungs every 6 (six) hours as needed for wheezing or shortness of breath.  Current Outpatient Medications (Analgesics):    aspirin  EC 81 MG tablet, Take 1 tablet (81 mg total) by mouth daily. Swallow whole.  Current Outpatient Medications (Hematological):    clopidogrel  (PLAVIX ) 75 MG tablet, Take 1 tablet (75 mg total) by mouth daily.  Current Outpatient Medications (Other):    famotidine  (PEPCID ) 20 MG tablet, Take 1 tablet (20 mg total) by mouth at bedtime as needed for heartburn or indigestion.   hydrocortisone  (ANUSOL -HC) 25 MG suppository, Place 1 suppository (25 mg total) rectally 2 (two) times daily.   nicotine  (NICODERM CQ  - DOSED IN MG/24 HOURS) 14 mg/24hr patch, Place 1 patch (14 mg total) onto the skin daily.   nicotine  (NICODERM CQ  - DOSED IN MG/24 HR) 7 mg/24hr patch, Place 1 patch (7 mg total) onto the skin daily.   NON FORMULARY, Pt uses a bi-pap nightly   pantoprazole  (PROTONIX ) 40 MG tablet, Take 1 tablet (40 mg total) by mouth daily.   polyethylene glycol (MIRALAX ) 17 g packet, Take 17 g by mouth daily as needed. *For reference purposes while preparing patient instructions.   Delete this med list prior to printing instructions for patient.*   Stop taking, Lasix  (Furosemide )  and Spironolactone  Friday, September 26,  On the morning of your procedure, take your Plavix /Clopidogrel  and any morning medicines NOT listed above.  You may use sips of water .  6. Plan to go home the same day, you will only stay overnight if medically necessary. 7. Bring a current list of your medications and current insurance cards. 8. You MUST have a responsible person to drive you home. 9. Someone MUST be with you the first 24 hours after you arrive home or your discharge will be delayed. 10. Please wear clothes that are easy to get on and off and wear slip-on shoes.  Thank you for  allowing us  to care for you!   -- [facility]   Follow-Up: At [facility], you and your health needs are our priority.  As part of our continuing mission to provide you with exceptional heart care, our providers are all part of one team.  This team includes your primary Cardiologist (physician) and Advanced Practice Providers or APPs (Physician Assistants and Nurse Practitioners) who all work together to provide you with the care you need, when you need it.  Your next appointment:   3 - 4 week(s)  Provider:   Barnie Hila, NP    We recommend signing up for the patient portal called MyChart.  Sign up information is provided on this After Visit Summary.  MyChart is used to connect with patients for Virtual Visits (Telemedicine).  Patients are able to view lab/test results, encounter notes, upcoming appointments, etc.  Non-urgent messages can be sent to your provider as well.   To learn more about what  you can do with MyChart, go to [email or web address].

## 2023-11-19 ENCOUNTER — Ambulatory Visit: Payer: Self-pay | Admitting: Student

## 2023-11-19 LAB — BASIC METABOLIC PANEL WITH GFR
BUN/Creatinine Ratio: 6 — ABNORMAL LOW (ref 9–20)
BUN: 9 mg/dL (ref 6–24)
CO2: 23 mmol/L (ref 20–29)
Calcium: 10 mg/dL (ref 8.7–10.2)
Chloride: 101 mmol/L (ref 96–106)
Creatinine, Ser: 1.57 mg/dL — ABNORMAL HIGH (ref 0.76–1.27)
Glucose: 76 mg/dL (ref 70–99)
Potassium: 4.3 mmol/L (ref 3.5–5.2)
Sodium: 140 mmol/L (ref 134–144)
eGFR: 54 mL/min/1.73 — ABNORMAL LOW (ref >59)

## 2023-11-19 LAB — CBC
Hematocrit: 51.1 % — ABNORMAL HIGH (ref 37.5–51.0)
Hemoglobin: 16.1 g/dL (ref 13.0–17.7)
MCH: 28 pg (ref 26.6–33.0)
MCHC: 31.5 g/dL (ref 31.5–35.7)
MCV: 89 fL (ref 79–97)
Platelets: 369 x10E3/uL (ref 150–450)
RBC: 5.75 x10E6/uL (ref 4.14–5.80)
RDW: 16.6 % — ABNORMAL HIGH (ref 11.6–15.4)
WBC: 10.4 x10E3/uL (ref 3.4–10.8)

## 2023-11-20 ENCOUNTER — Encounter: Admission: RE | Disposition: A | Payer: Self-pay | Source: Home / Self Care | Attending: Cardiovascular Disease

## 2023-11-20 ENCOUNTER — Inpatient Hospital Stay
Admission: RE | Admit: 2023-11-20 | Discharge: 2023-11-25 | DRG: 321 | Disposition: A | Discharge status: Home / Self Care

## 2023-11-20 ENCOUNTER — Observation Stay

## 2023-11-20 ENCOUNTER — Encounter: Payer: Self-pay | Admitting: Cardiovascular Disease

## 2023-11-20 ENCOUNTER — Other Ambulatory Visit: Payer: Self-pay

## 2023-11-20 DIAGNOSIS — Z8616 Personal history of COVID-19: Secondary | ICD-10-CM

## 2023-11-20 DIAGNOSIS — L02214 Cutaneous abscess of groin: Secondary | ICD-10-CM | POA: Diagnosis present

## 2023-11-20 DIAGNOSIS — N1831 Chronic kidney disease, stage 3a: Secondary | ICD-10-CM | POA: Diagnosis present

## 2023-11-20 DIAGNOSIS — F17210 Nicotine dependence, cigarettes, uncomplicated: Secondary | ICD-10-CM | POA: Diagnosis present

## 2023-11-20 DIAGNOSIS — I255 Ischemic cardiomyopathy: Secondary | ICD-10-CM | POA: Diagnosis present

## 2023-11-20 DIAGNOSIS — L729 Follicular cyst of the skin and subcutaneous tissue, unspecified: Secondary | ICD-10-CM | POA: Diagnosis present

## 2023-11-20 DIAGNOSIS — Z9109 Other allergy status, other than to drugs and biological substances: Secondary | ICD-10-CM

## 2023-11-20 DIAGNOSIS — Z951 Presence of aortocoronary bypass graft: Secondary | ICD-10-CM

## 2023-11-20 DIAGNOSIS — I25110 Atherosclerotic heart disease of native coronary artery with unstable angina pectoris: Secondary | ICD-10-CM | POA: Diagnosis present

## 2023-11-20 DIAGNOSIS — Z955 Presence of coronary angioplasty implant and graft: Secondary | ICD-10-CM

## 2023-11-20 DIAGNOSIS — L03115 Cellulitis of right lower limb: Secondary | ICD-10-CM | POA: Diagnosis present

## 2023-11-20 DIAGNOSIS — Z79899 Other long term (current) drug therapy: Secondary | ICD-10-CM

## 2023-11-20 DIAGNOSIS — R079 Chest pain, unspecified: Secondary | ICD-10-CM | POA: Diagnosis present

## 2023-11-20 DIAGNOSIS — Z7902 Long term (current) use of antithrombotics/antiplatelets: Secondary | ICD-10-CM

## 2023-11-20 DIAGNOSIS — I10 Essential (primary) hypertension: Secondary | ICD-10-CM | POA: Diagnosis present

## 2023-11-20 DIAGNOSIS — Z7901 Long term (current) use of anticoagulants: Secondary | ICD-10-CM

## 2023-11-20 DIAGNOSIS — E669 Obesity, unspecified: Secondary | ICD-10-CM | POA: Diagnosis present

## 2023-11-20 DIAGNOSIS — I5032 Chronic diastolic (congestive) heart failure: Secondary | ICD-10-CM | POA: Diagnosis present

## 2023-11-20 DIAGNOSIS — I2582 Chronic total occlusion of coronary artery: Secondary | ICD-10-CM | POA: Diagnosis present

## 2023-11-20 DIAGNOSIS — I2510 Atherosclerotic heart disease of native coronary artery without angina pectoris: Secondary | ICD-10-CM | POA: Diagnosis present

## 2023-11-20 DIAGNOSIS — Z6833 Body mass index (BMI) 33.0-33.9, adult: Secondary | ICD-10-CM

## 2023-11-20 DIAGNOSIS — T82855A Stenosis of coronary artery stent, initial encounter: Principal | ICD-10-CM | POA: Diagnosis present

## 2023-11-20 DIAGNOSIS — I358 Other nonrheumatic aortic valve disorders: Secondary | ICD-10-CM | POA: Diagnosis present

## 2023-11-20 DIAGNOSIS — I700 Atherosclerosis of aorta: Secondary | ICD-10-CM | POA: Diagnosis present

## 2023-11-20 DIAGNOSIS — Z8249 Family history of ischemic heart disease and other diseases of the circulatory system: Secondary | ICD-10-CM

## 2023-11-20 DIAGNOSIS — Z800 Family history of malignant neoplasm of digestive organs: Secondary | ICD-10-CM

## 2023-11-20 DIAGNOSIS — Z91148 Patient's other noncompliance with medication regimen for other reason: Secondary | ICD-10-CM

## 2023-11-20 DIAGNOSIS — I252 Old myocardial infarction: Secondary | ICD-10-CM

## 2023-11-20 DIAGNOSIS — Y828 Other medical devices associated with adverse incidents: Secondary | ICD-10-CM | POA: Diagnosis present

## 2023-11-20 DIAGNOSIS — Z803 Family history of malignant neoplasm of breast: Secondary | ICD-10-CM

## 2023-11-20 DIAGNOSIS — Z7984 Long term (current) use of oral hypoglycemic drugs: Secondary | ICD-10-CM

## 2023-11-20 DIAGNOSIS — G4733 Obstructive sleep apnea (adult) (pediatric): Secondary | ICD-10-CM | POA: Diagnosis present

## 2023-11-20 DIAGNOSIS — I200 Unstable angina: Principal | ICD-10-CM | POA: Diagnosis present

## 2023-11-20 DIAGNOSIS — E66811 Obesity, class 1: Secondary | ICD-10-CM | POA: Diagnosis present

## 2023-11-20 DIAGNOSIS — Z83719 Family history of colon polyps, unspecified: Secondary | ICD-10-CM

## 2023-11-20 DIAGNOSIS — I25710 Atherosclerosis of autologous vein coronary artery bypass graft(s) with unstable angina pectoris: Secondary | ICD-10-CM | POA: Diagnosis present

## 2023-11-20 DIAGNOSIS — E785 Hyperlipidemia, unspecified: Secondary | ICD-10-CM | POA: Diagnosis present

## 2023-11-20 DIAGNOSIS — I110 Hypertensive heart disease with heart failure: Secondary | ICD-10-CM | POA: Diagnosis present

## 2023-11-20 DIAGNOSIS — F1910 Other psychoactive substance abuse, uncomplicated: Secondary | ICD-10-CM | POA: Diagnosis present

## 2023-11-20 DIAGNOSIS — Z7951 Long term (current) use of inhaled steroids: Secondary | ICD-10-CM

## 2023-11-20 DIAGNOSIS — K219 Gastro-esophageal reflux disease without esophagitis: Secondary | ICD-10-CM | POA: Diagnosis present

## 2023-11-20 DIAGNOSIS — Y831 Surgical operation with implant of artificial internal device as the cause of abnormal reaction of the patient, or of later complication, without mention of misadventure at the time of the procedure: Secondary | ICD-10-CM | POA: Diagnosis present

## 2023-11-20 DIAGNOSIS — Z7982 Long term (current) use of aspirin: Secondary | ICD-10-CM

## 2023-11-20 DIAGNOSIS — L03314 Cellulitis of groin: Secondary | ICD-10-CM | POA: Diagnosis present

## 2023-11-20 DIAGNOSIS — J438 Other emphysema: Secondary | ICD-10-CM | POA: Diagnosis present

## 2023-11-20 HISTORY — PX: LEFT HEART CATH AND CORS/GRAFTS ANGIOGRAPHY: CATH118250

## 2023-11-20 HISTORY — PX: CORONARY IMAGING/OCT: CATH118326

## 2023-11-20 HISTORY — PX: CORONARY STENT INTERVENTION: CATH118234

## 2023-11-20 LAB — MRSA NEXT GEN BY PCR, NASAL: MRSA by PCR Next Gen: NOT DETECTED

## 2023-11-20 LAB — URINE DRUG SCREEN, QUALITATIVE (ARMC ONLY)
Amphetamines, Ur Screen: NOT DETECTED
Barbiturates, Ur Screen: NOT DETECTED
Benzodiazepine, Ur Scrn: POSITIVE — AB
Cannabinoid 50 Ng, Ur ~~LOC~~: NOT DETECTED
Cocaine Metabolite,Ur ~~LOC~~: NOT DETECTED
MDMA (Ecstasy)Ur Screen: NOT DETECTED
Methadone Scn, Ur: NOT DETECTED
Opiate, Ur Screen: NOT DETECTED
Phencyclidine (PCP) Ur S: NOT DETECTED
Tricyclic, Ur Screen: NOT DETECTED

## 2023-11-20 LAB — CREATININE, SERUM
Creatinine, Ser: 1.27 mg/dL — ABNORMAL HIGH (ref 0.61–1.24)
GFR, Estimated: >60 mL/min (ref >60)

## 2023-11-20 LAB — POCT ACTIVATED CLOTTING TIME
Activated Clotting Time: 262 s
Activated Clotting Time: 412 s

## 2023-11-20 LAB — CBC
HCT: 43.3 % (ref 39.0–52.0)
Hemoglobin: 14.6 g/dL (ref 13.0–17.0)
MCH: 27.7 pg (ref 26.0–34.0)
MCHC: 33.7 g/dL (ref 30.0–36.0)
MCV: 82.2 fL (ref 80.0–100.0)
Platelets: 274 K/uL (ref 150–400)
RBC: 5.27 MIL/uL (ref 4.22–5.81)
RDW: 15.6 % — ABNORMAL HIGH (ref 11.5–15.5)
WBC: 12 K/uL — ABNORMAL HIGH (ref 4.0–10.5)
nRBC: 0 % (ref 0.0–0.2)

## 2023-11-20 SURGERY — LEFT HEART CATH AND CORS/GRAFTS ANGIOGRAPHY
Anesthesia: Moderate Sedation

## 2023-11-20 MED ORDER — CEFAZOLIN SODIUM-DEXTROSE 2-4 GM/100ML-% IV SOLN
2.0000 g | Freq: Three times a day (TID) | INTRAVENOUS | Status: DC
  Administered 2023-11-21 (×3): 2 g via INTRAVENOUS
  Filled 2023-11-20 (×4): qty 100

## 2023-11-20 MED ORDER — VERAPAMIL HCL 2.5 MG/ML IV SOLN
INTRAVENOUS | Status: DC | PRN
  Administered 2023-11-20: 2.5 mg

## 2023-11-20 MED ORDER — SODIUM CHLORIDE 0.9% FLUSH
3.0000 mL | Freq: Two times a day (BID) | INTRAVENOUS | Status: DC
  Administered 2023-11-20 – 2023-11-25 (×9): 3 mL via INTRAVENOUS

## 2023-11-20 MED ORDER — ROSUVASTATIN CALCIUM 20 MG PO TABS
40.0000 mg | ORAL_TABLET | Freq: Every day | ORAL | Status: AC
Start: 2023-11-21 — End: ?
  Administered 2023-11-21 – 2023-11-25 (×4): 40 mg via ORAL
  Filled 2023-11-20 (×2): qty 2
  Filled 2023-11-20: qty 4
  Filled 2023-11-20 (×2): qty 2

## 2023-11-20 MED ORDER — FREE WATER: 500.0000 mL | Freq: Once | Status: DC

## 2023-11-20 MED ORDER — FENTANYL CITRATE (PF) 100 MCG/2ML IJ SOLN
INTRAMUSCULAR | Status: DC | PRN
  Administered 2023-11-20 (×3): 25 ug via INTRAVENOUS

## 2023-11-20 MED ORDER — ASPIRIN 81 MG PO CHEW
CHEWABLE_TABLET | ORAL | Status: AC
  Filled 2023-11-20: qty 1

## 2023-11-20 MED ORDER — IOHEXOL 300 MG/ML  SOLN
INTRAMUSCULAR | Status: DC | PRN
  Administered 2023-11-20: 220 mL

## 2023-11-20 MED ORDER — CLOPIDOGREL BISULFATE 75 MG PO TABS
ORAL_TABLET | ORAL | Status: AC
  Filled 2023-11-20: qty 4

## 2023-11-20 MED ORDER — VERAPAMIL HCL 2.5 MG/ML IV SOLN
INTRAVENOUS | Status: AC
  Filled 2023-11-20: qty 2

## 2023-11-20 MED ORDER — NITROGLYCERIN IN D5W 200-5 MCG/ML-% IV SOLN
INTRAVENOUS | Status: AC
  Filled 2023-11-20: qty 250

## 2023-11-20 MED ORDER — ONDANSETRON HCL 4 MG/2ML IJ SOLN: 4.0000 mg | Freq: Four times a day (QID) | INTRAMUSCULAR | Status: DC | PRN

## 2023-11-20 MED ORDER — SODIUM CHLORIDE 0.9 % IV SOLN: INTRAVENOUS | Status: AC

## 2023-11-20 MED ORDER — NITROGLYCERIN IN D5W 200-5 MCG/ML-% IV SOLN: 0.0000 ug/min | INTRAVENOUS | Status: DC

## 2023-11-20 MED ORDER — SODIUM CHLORIDE 0.9 % IV SOLN
250.0000 mL | INTRAVENOUS | Status: AC | PRN
  Administered 2023-11-20: 250 mL via INTRAVENOUS

## 2023-11-20 MED ORDER — HEPARIN (PORCINE) IN NACL 1000-0.9 UT/500ML-% IV SOLN
INTRAVENOUS | Status: AC
  Filled 2023-11-20: qty 1000

## 2023-11-20 MED ORDER — SODIUM CHLORIDE 3% (HYPERTONIC SALINE) BOLUS VIA INFUSION
INTRAVENOUS | Status: DC | PRN
  Administered 2023-11-20: 250 mL via INTRAVENOUS

## 2023-11-20 MED ORDER — HEPARIN SODIUM (PORCINE) 1000 UNIT/ML IJ SOLN
INTRAMUSCULAR | Status: AC
  Filled 2023-11-20: qty 10

## 2023-11-20 MED ORDER — FENTANYL CITRATE (PF) 100 MCG/2ML IJ SOLN
INTRAMUSCULAR | Status: AC
  Filled 2023-11-20: qty 2

## 2023-11-20 MED ORDER — NITROGLYCERIN IN D5W 200-5 MCG/ML-% IV SOLN
INTRAVENOUS | Status: DC | PRN
  Administered 2023-11-20: 10 ug/min via INTRAVENOUS

## 2023-11-20 MED ORDER — FAMOTIDINE 20 MG PO TABS
20.0000 mg | ORAL_TABLET | Freq: Every evening | ORAL | Status: DC | PRN
  Administered 2023-11-20: 20 mg via ORAL
  Filled 2023-11-20: qty 1

## 2023-11-20 MED ORDER — SODIUM CHLORIDE 0.9% FLUSH: 3.0000 mL | INTRAVENOUS | Status: DC | PRN

## 2023-11-20 MED ORDER — LOSARTAN POTASSIUM 25 MG PO TABS
25.0000 mg | ORAL_TABLET | Freq: Every day | ORAL | Status: DC
  Administered 2023-11-21 – 2023-11-25 (×3): 25 mg via ORAL
  Filled 2023-11-20 (×3): qty 1

## 2023-11-20 MED ORDER — SODIUM CHLORIDE 0.9% FLUSH
3.0000 mL | Freq: Two times a day (BID) | INTRAVENOUS | Status: DC
  Administered 2023-11-20 – 2023-11-25 (×10): 3 mL via INTRAVENOUS

## 2023-11-20 MED ORDER — ISOSORBIDE MONONITRATE ER 30 MG PO TB24
60.0000 mg | ORAL_TABLET | Freq: Every day | ORAL | Status: DC
Start: 2023-11-21 — End: 2023-11-22
  Administered 2023-11-21: 60 mg via ORAL
  Filled 2023-11-20: qty 1

## 2023-11-20 MED ORDER — PANTOPRAZOLE SODIUM 40 MG PO TBEC
40.0000 mg | DELAYED_RELEASE_TABLET | Freq: Every day | ORAL | Status: DC
  Administered 2023-11-21 – 2023-11-25 (×4): 40 mg via ORAL
  Filled 2023-11-20 (×5): qty 1

## 2023-11-20 MED ORDER — CLOPIDOGREL BISULFATE 75 MG PO TABS
ORAL_TABLET | ORAL | Status: DC | PRN
  Administered 2023-11-20: 300 mg via ORAL

## 2023-11-20 MED ORDER — EMPAGLIFLOZIN 10 MG PO TABS
10.0000 mg | ORAL_TABLET | Freq: Every day | ORAL | Status: DC
Start: 2023-11-21 — End: 2023-11-21

## 2023-11-20 MED ORDER — SPIRONOLACTONE 25 MG PO TABS
25.0000 mg | ORAL_TABLET | Freq: Every day | ORAL | Status: DC
  Administered 2023-11-21 – 2023-11-25 (×3): 25 mg via ORAL
  Filled 2023-11-20 (×3): qty 1

## 2023-11-20 MED ORDER — LIDOCAINE HCL 1 % IJ SOLN
INTRAMUSCULAR | Status: AC
  Filled 2023-11-20: qty 20

## 2023-11-20 MED ORDER — HYDROCORTISONE ACETATE 25 MG RE SUPP
25.0000 mg | Freq: Two times a day (BID) | RECTAL | Status: DC
  Filled 2023-11-20 (×2): qty 1

## 2023-11-20 MED ORDER — FUROSEMIDE 40 MG PO TABS
40.0000 mg | ORAL_TABLET | Freq: Every day | ORAL | Status: DC
Start: 2023-11-21 — End: 2023-11-22
  Administered 2023-11-21: 40 mg via ORAL
  Filled 2023-11-20: qty 1
  Filled 2023-11-20: qty 2

## 2023-11-20 MED ORDER — NITROGLYCERIN 1 MG/10 ML FOR IR/CATH LAB
INTRA_ARTERIAL | Status: AC
  Filled 2023-11-20: qty 10

## 2023-11-20 MED ORDER — EZETIMIBE 10 MG PO TABS
10.0000 mg | ORAL_TABLET | Freq: Every day | ORAL | Status: DC
  Administered 2023-11-21 – 2023-11-25 (×4): 10 mg via ORAL
  Filled 2023-11-20 (×5): qty 1

## 2023-11-20 MED ORDER — ASPIRIN 81 MG PO CHEW
81.0000 mg | CHEWABLE_TABLET | ORAL | Status: AC
  Administered 2023-11-20: 81 mg via ORAL

## 2023-11-20 MED ORDER — IOHEXOL 300 MG/ML  SOLN
100.0000 mL | Freq: Once | INTRAMUSCULAR | Status: AC | PRN
Start: 2023-11-20 — End: 2023-11-20
  Administered 2023-11-20: 100 mL via INTRAVENOUS

## 2023-11-20 MED ORDER — MIDAZOLAM HCL 2 MG/2ML IJ SOLN
INTRAMUSCULAR | Status: AC
  Filled 2023-11-20: qty 2

## 2023-11-20 MED ORDER — SODIUM CHLORIDE 0.9% FLUSH
3.0000 mL | INTRAVENOUS | Status: DC | PRN
  Administered 2023-11-24: 3 mL via INTRAVENOUS

## 2023-11-20 MED ORDER — CLOPIDOGREL BISULFATE 75 MG PO TABS
75.0000 mg | ORAL_TABLET | Freq: Every day | ORAL | Status: DC
Start: 2023-11-21 — End: 2023-11-25
  Administered 2023-11-21 – 2023-11-25 (×4): 75 mg via ORAL
  Filled 2023-11-20 (×4): qty 1

## 2023-11-20 MED ORDER — ENOXAPARIN SODIUM 40 MG/0.4ML IJ SOSY
40.0000 mg | PREFILLED_SYRINGE | INTRAMUSCULAR | Status: DC
  Administered 2023-11-21 – 2023-11-25 (×4): 40 mg via SUBCUTANEOUS
  Filled 2023-11-20 (×4): qty 0.4

## 2023-11-20 MED ORDER — ACETAMINOPHEN 325 MG PO TABS
650.0000 mg | ORAL_TABLET | ORAL | Status: DC | PRN
  Administered 2023-11-21: 650 mg via ORAL
  Filled 2023-11-20 (×2): qty 2

## 2023-11-20 MED ORDER — MIDAZOLAM HCL 2 MG/2ML IJ SOLN
INTRAMUSCULAR | Status: DC | PRN
  Administered 2023-11-20 (×2): 1 mg via INTRAVENOUS

## 2023-11-20 MED ORDER — ALBUTEROL SULFATE (2.5 MG/3ML) 0.083% IN NEBU: 2.5000 mg | INHALATION_SOLUTION | Freq: Four times a day (QID) | RESPIRATORY_TRACT | Status: DC | PRN

## 2023-11-20 MED ORDER — NITROGLYCERIN 1 MG/10 ML FOR IR/CATH LAB
INTRA_ARTERIAL | Status: DC | PRN
  Administered 2023-11-20 (×4): 200 ug via INTRACORONARY

## 2023-11-20 MED ORDER — FENOFIBRATE 54 MG PO TABS
54.0000 mg | ORAL_TABLET | Freq: Every day | ORAL | Status: DC
  Administered 2023-11-21 – 2023-11-25 (×4): 54 mg via ORAL
  Filled 2023-11-20 (×5): qty 1

## 2023-11-20 MED ORDER — BUDESON-GLYCOPYRROL-FORMOTEROL 160-9-4.8 MCG/ACT IN AERO
2.0000 | INHALATION_SPRAY | Freq: Two times a day (BID) | RESPIRATORY_TRACT | Status: DC
  Administered 2023-11-20 – 2023-11-25 (×10): 2 via RESPIRATORY_TRACT
  Filled 2023-11-20: qty 5.9

## 2023-11-20 MED ORDER — METOPROLOL SUCCINATE ER 25 MG PO TB24
25.0000 mg | ORAL_TABLET | Freq: Two times a day (BID) | ORAL | Status: DC
Start: 2023-11-20 — End: 2023-11-22
  Administered 2023-11-20 – 2023-11-21 (×3): 25 mg via ORAL
  Filled 2023-11-20 (×3): qty 1

## 2023-11-20 MED ORDER — HEPARIN (PORCINE) IN NACL 1000-0.9 UT/500ML-% IV SOLN
INTRAVENOUS | Status: DC | PRN
  Administered 2023-11-20: 1000 mL

## 2023-11-20 MED ORDER — ASPIRIN 81 MG PO TBEC
81.0000 mg | DELAYED_RELEASE_TABLET | Freq: Every day | ORAL | Status: DC
  Administered 2023-11-21 – 2023-11-25 (×4): 81 mg via ORAL
  Filled 2023-11-20 (×4): qty 1

## 2023-11-20 MED ORDER — HEPARIN SODIUM (PORCINE) 1000 UNIT/ML IJ SOLN
INTRAMUSCULAR | Status: DC | PRN
  Administered 2023-11-20: 5000 [IU] via INTRAVENOUS
  Administered 2023-11-20: 2000 [IU] via INTRAVENOUS
  Administered 2023-11-20: 5000 [IU] via INTRAVENOUS

## 2023-11-20 MED ORDER — LIDOCAINE HCL (PF) 1 % IJ SOLN
INTRAMUSCULAR | Status: DC | PRN
  Administered 2023-11-20: 2 mL

## 2023-11-20 MED ORDER — SODIUM CHLORIDE 0.9 % IV SOLN: 250.0000 mL | INTRAVENOUS | Status: AC | PRN

## 2023-11-20 SURGICAL SUPPLY — 19 items
BALLOON MINITREK RX 2.0X20 (BALLOONS) IMPLANT
BALLOON ~~LOC~~ TREK NEO RX 3.0X20 (BALLOONS) IMPLANT
CATH 5F 110X4 TIG (CATHETERS) IMPLANT
CATH DRAGONFLY OPSTAR (CATHETERS) IMPLANT
CATH INFINITI 5FR JL4 (CATHETERS) IMPLANT
CATH LAUNCHER 6FR EBU 4 (CATHETERS) IMPLANT
CATH VISTA GUIDE 6FR JL3.5 MPK (CATHETERS) IMPLANT
DEVICE RAD TR BAND REGULAR (VASCULAR PRODUCTS) IMPLANT
DRAPE BRACHIAL (DRAPES) IMPLANT
GLIDESHEATH SLEND SS 6F .021 (SHEATH) IMPLANT
GUIDEWIRE INQWIRE 1.5J.035X260 (WIRE) IMPLANT
KIT ENCORE 26 ADVANTAGE (KITS) IMPLANT
PACK CARDIAC CATH (CUSTOM PROCEDURE TRAY) ×2 IMPLANT
SET ATX-X65L (MISCELLANEOUS) IMPLANT
STATION PROTECTION PRESSURIZED (MISCELLANEOUS) IMPLANT
STENT ONYX FRONTIER 2.25X26 (Permanent Stent) IMPLANT
STENT ONYX FRONTIER 2.5X30 (Permanent Stent) IMPLANT
TUBING CIL FLEX 10 FLL-RA (TUBING) IMPLANT
WIRE RUNTHROUGH .014X180CM (WIRE) IMPLANT

## 2023-11-20 NOTE — Interval H&P Note (Signed)
 History and Physical Interval Note:  11/20/2023 12:25 PM  Logan Crawford  has presented today for surgery, with the diagnosis of L Cath w graft   Chest pain.  The various methods of treatment have been discussed with the patient and family. After consideration of risks, benefits and other options for treatment, the patient has consented to  Procedure(s): LEFT HEART CATH AND CORS/GRAFTS ANGIOGRAPHY (Left) as a surgical intervention.  The patient's history has been reviewed, patient examined, no change in status, stable for surgery.  I have reviewed the patient's chart and labs.  Questions were answered to the patient's satisfaction.     [name]

## 2023-11-20 NOTE — Progress Notes (Addendum)
 48 y.o male with extensive cardiac hx of CAD with multiple PCI, CABG x 3, chronic Diastolic Heart Failure, HTN, HLD, OSA, CKD stage IIIa, GERD, Emphysema, polysubstance abuse and medication nonadherence who presented to the hospital for elective LHC with graft as a surgical intervention for chest pain.   Patient was admitted to cardiology service. Overnight the RN reached out to me to evaluate a painful right groin mass. On review of chart, patient was evaluated for Right groin lesion/cellulitis on 11/26/2019. He was discharged on Keflex  500 mg p.o. every 6 hours to complete 10-day course. He was instructed that he may need outpatient follow-up with general surgery versus dermatology if lesion remains for consideration of excisional biopsy for possible underlying cyst.  I discussed these findings with the primary team who agreed with further evaluation of recurrent right groin lesion. A STAT CT abd/pelvis was obtained which showed: IMPRESSION: 1. Infiltration the subcutaneous fat of the right groin extending to the base of the penis medially. This is likely cellulitis. No loculated collections. 2. No evidence of bowel obstruction or inflammation. Appendix is normal.  Right Groin Cellulitis Worrisome for possible early onset Fournier's gangrene on Jardiance ? -Follow cultures, trend Lactic and PCT -Monitor WBC/ Fever curve -IV antibiotics Cefazolin  transition to po Keflex  upon discharge. -Patient may need further evaluation by general surgery given recurring lesion if appropriate  -Discussed above finding and management with primary team who agrees to follow up. No critical care needs at this time. We would be glad to assist further with formal consult if needed.    Almarie Nose, DNP, CCRN, FNP-C, AGACNP-BC Acute Care & Family Nurse Practitioner  [facility]  See Amion for personal pager PCCM on call pager [phone] until 7 am

## 2023-11-20 NOTE — Plan of Care (Signed)
  Problem: Education: Goal: Understanding of CV disease, CV risk reduction, and recovery process will improve Outcome: Progressing   Problem: Activity: Goal: Ability to return to baseline activity level will improve Outcome: Progressing   Problem: Cardiovascular: Goal: Ability to achieve and maintain adequate cardiovascular perfusion will improve Outcome: Progressing Goal: Vascular access site(s) Level 0-1 will be maintained Outcome: Progressing   Problem: Clinical Measurements: Goal: Ability to maintain clinical measurements within normal limits will improve Outcome: Progressing Goal: Will remain free from infection Outcome: Progressing Goal: Diagnostic test results will improve Outcome: Progressing   Problem: Nutrition: Goal: Adequate nutrition will be maintained Outcome: Progressing   Problem: Coping: Goal: Level of anxiety will decrease Outcome: Progressing

## 2023-11-21 ENCOUNTER — Encounter: Payer: Self-pay | Admitting: Cardiovascular Disease

## 2023-11-21 DIAGNOSIS — I259 Chronic ischemic heart disease, unspecified: Secondary | ICD-10-CM

## 2023-11-21 DIAGNOSIS — K219 Gastro-esophageal reflux disease without esophagitis: Secondary | ICD-10-CM

## 2023-11-21 DIAGNOSIS — I2510 Atherosclerotic heart disease of native coronary artery without angina pectoris: Secondary | ICD-10-CM

## 2023-11-21 DIAGNOSIS — E785 Hyperlipidemia, unspecified: Secondary | ICD-10-CM

## 2023-11-21 DIAGNOSIS — L729 Follicular cyst of the skin and subcutaneous tissue, unspecified: Secondary | ICD-10-CM | POA: Diagnosis not present

## 2023-11-21 DIAGNOSIS — J438 Other emphysema: Secondary | ICD-10-CM

## 2023-11-21 DIAGNOSIS — N1831 Chronic kidney disease, stage 3a: Secondary | ICD-10-CM

## 2023-11-21 DIAGNOSIS — I10 Essential (primary) hypertension: Secondary | ICD-10-CM

## 2023-11-21 DIAGNOSIS — G4733 Obstructive sleep apnea (adult) (pediatric): Secondary | ICD-10-CM | POA: Diagnosis not present

## 2023-11-21 DIAGNOSIS — L03314 Cellulitis of groin: Secondary | ICD-10-CM | POA: Diagnosis not present

## 2023-11-21 DIAGNOSIS — I5032 Chronic diastolic (congestive) heart failure: Secondary | ICD-10-CM

## 2023-11-21 DIAGNOSIS — E669 Obesity, unspecified: Secondary | ICD-10-CM

## 2023-11-21 DIAGNOSIS — Z951 Presence of aortocoronary bypass graft: Secondary | ICD-10-CM

## 2023-11-21 DIAGNOSIS — F1910 Other psychoactive substance abuse, uncomplicated: Secondary | ICD-10-CM

## 2023-11-21 DIAGNOSIS — I25118 Atherosclerotic heart disease of native coronary artery with other forms of angina pectoris: Secondary | ICD-10-CM

## 2023-11-21 LAB — BASIC METABOLIC PANEL WITH GFR
Anion gap: 11 (ref 5–15)
BUN: 13 mg/dL (ref 6–20)
CO2: 20 mmol/L — ABNORMAL LOW (ref 22–32)
Calcium: 8.3 mg/dL — ABNORMAL LOW (ref 8.9–10.3)
Chloride: 106 mmol/L (ref 98–111)
Creatinine, Ser: 1.21 mg/dL (ref 0.61–1.24)
GFR, Estimated: >60 mL/min (ref >60)
Glucose, Bld: 97 mg/dL (ref 70–99)
Potassium: 3.8 mmol/L (ref 3.5–5.1)
Sodium: 137 mmol/L (ref 135–145)

## 2023-11-21 LAB — SURGICAL PCR SCREEN
MRSA, PCR: NEGATIVE
Staphylococcus aureus: POSITIVE — AB

## 2023-11-21 LAB — CBC
HCT: 42.4 % (ref 39.0–52.0)
Hemoglobin: 14.4 g/dL (ref 13.0–17.0)
MCH: 27.6 pg (ref 26.0–34.0)
MCHC: 34 g/dL (ref 30.0–36.0)
MCV: 81.2 fL (ref 80.0–100.0)
Platelets: 296 K/uL (ref 150–400)
RBC: 5.22 MIL/uL (ref 4.22–5.81)
RDW: 15.7 % — ABNORMAL HIGH (ref 11.5–15.5)
WBC: 12.3 K/uL — ABNORMAL HIGH (ref 4.0–10.5)
nRBC: 0 % (ref 0.0–0.2)

## 2023-11-21 LAB — PROCALCITONIN: Procalcitonin: <0.1 ng/mL

## 2023-11-21 LAB — LACTIC ACID, PLASMA: Lactic Acid, Venous: 1.2 mmol/L (ref 0.5–1.9)

## 2023-11-21 MED ORDER — SODIUM CHLORIDE 0.9 % IV SOLN
1.0000 g | INTRAVENOUS | Status: DC
  Administered 2023-11-21 – 2023-11-24 (×4): 1 g via INTRAVENOUS
  Filled 2023-11-21 (×4): qty 10

## 2023-11-21 MED ORDER — VANCOMYCIN HCL 2000 MG/400ML IV SOLN
2000.0000 mg | INTRAVENOUS | Status: DC
  Filled 2023-11-21: qty 400

## 2023-11-21 MED ORDER — NICOTINE 14 MG/24HR TD PT24
14.0000 mg | MEDICATED_PATCH | Freq: Every day | TRANSDERMAL | Status: DC
  Administered 2023-11-21 – 2023-11-25 (×5): 14 mg via TRANSDERMAL
  Filled 2023-11-21 (×5): qty 1

## 2023-11-21 MED ORDER — CHLORHEXIDINE GLUCONATE CLOTH 2 % EX PADS
6.0000 | MEDICATED_PAD | Freq: Every day | CUTANEOUS | Status: DC
Start: 2023-11-21 — End: 2023-11-25
  Administered 2023-11-22 – 2023-11-24 (×3): 6 via TOPICAL

## 2023-11-21 MED ORDER — VANCOMYCIN HCL 2000 MG/400ML IV SOLN
2000.0000 mg | Freq: Once | INTRAVENOUS | Status: AC
  Administered 2023-11-21: 2000 mg via INTRAVENOUS
  Filled 2023-11-21: qty 400

## 2023-11-21 NOTE — Progress Notes (Signed)
 Rounding Note   Patient Name: Logan Crawford Date of Encounter: 11/21/2023  [facility] Cardiologist: Redell Cave, MD   Subjective No acute events overnight, sleeping comfortably, denies chest pain or shortness of breath.  Scheduled Meds:  aspirin  EC  81 mg Oral Daily   budesonide -glycopyrrolate -formoterol   2 puff Inhalation BID   Chlorhexidine  Gluconate Cloth  6 each Topical Daily   clopidogrel   75 mg Oral Daily   enoxaparin  (LOVENOX ) injection  40 mg Subcutaneous Q24H   ezetimibe   10 mg Oral Daily   fenofibrate   54 mg Oral Daily   free water   500 mL Oral Once   furosemide   40 mg Oral Daily   isosorbide  mononitrate  60 mg Oral Daily   losartan   25 mg Oral Daily   metoprolol  succinate  25 mg Oral BID   nicotine   14 mg Transdermal Daily   pantoprazole   40 mg Oral Daily   rosuvastatin   40 mg Oral Daily   sodium chloride  flush  3 mL Intravenous Q12H   sodium chloride  flush  3 mL Intravenous Q12H   spironolactone   25 mg Oral Daily   Continuous Infusions:  sodium chloride       ceFAZolin  (ANCEF ) IV 2 g (11/21/23 1422)   nitroGLYCERIN  Stopped (11/21/23 0701)   vancomycin      [START ON 11/22/2023] vancomycin      PRN Meds: sodium chloride , acetaminophen , albuterol , famotidine , ondansetron  (ZOFRAN ) IV, sodium chloride  flush, sodium chloride  flush   Vital Signs  Vitals:   11/21/23 1000 11/21/23 1100 11/21/23 1200 11/21/23 1300  BP: 127/74 125/85 134/86 122/81  Pulse: 89 77 76 84  Resp: 18 14 (!) 21 20  Temp:      TempSrc:      SpO2: 92% 95% 96% 96%  Weight:      Height:        Intake/Output Summary (Last 24 hours) at 11/21/2023 1653 Last data filed at 11/21/2023 1300 Gross per 24 hour  Intake 1692.93 ml  Output 3350 ml  Net -1657.07 ml      11/20/2023   11:38 AM 11/18/2023    2:52 PM 10/28/2023    2:27 PM  Last 3 Weights  Weight (lbs) 241 lb 3.2 oz 242 lb 244 lb 6.4 oz  Weight (kg) 109.408 kg 109.77 kg 110.859 kg      Telemetry Sinus rhythm,  heart rate 85- Personally Reviewed  ECG   - Personally Reviewed  Physical Exam  GEN: No acute distress.   Neck: No JVD Cardiac: RRR, no murmurs, rubs, or gallops.  Respiratory: Clear to auscultation bilaterally. GI: Soft, nontender, non-distended  MS: No edema; No deformity. Neuro:  Nonfocal  Psych: Normal affect   Labs High Sensitivity Troponin:  No results for input(s): TROPONINIHS in the last 720 hours.   Chemistry Recent Labs  Lab 11/18/23 1548 11/20/23 1652 11/21/23 0558  NA 140  --  137  K 4.3  --  3.8  CL 101  --  106  CO2 23  --  20*  GLUCOSE 76  --  97  BUN 9  --  13  CREATININE 1.57* 1.27* 1.21  CALCIUM  10.0  --  8.3*  GFRNONAA  --  >60 >60  ANIONGAP  --   --  11    Lipids No results for input(s): CHOL, TRIG, HDL, LABVLDL, LDLCALC, CHOLHDL in the last 168 hours.  Hematology Recent Labs  Lab 11/18/23 1548 11/20/23 1652 11/21/23 0558  WBC 10.4 12.0* 12.3*  RBC 5.75  5.27 5.22  HGB 16.1 14.6 14.4  HCT 51.1* 43.3 42.4  MCV 89 82.2 81.2  MCH 28.0 27.7 27.6  MCHC 31.5 33.7 34.0  RDW 16.6* 15.6* 15.7*  PLT 369 274 296   Thyroid No results for input(s): TSH, FREET4 in the last 168 hours.  BNPNo results for input(s): BNP, PROBNP in the last 168 hours.  DDimer No results for input(s): DDIMER in the last 168 hours.   Radiology  CT ABDOMEN PELVIS W CONTRAST Result Date: 11/20/2023 CLINICAL DATA:  Right lower quadrant abdominal pain. Right groin abscess. EXAM: CT ABDOMEN AND PELVIS WITH CONTRAST TECHNIQUE: Multidetector CT imaging of the abdomen and pelvis was performed using the standard protocol following bolus administration of intravenous contrast. RADIATION DOSE REDUCTION: This exam was performed according to the departmental dose-optimization program which includes automated exposure control, adjustment of the mA and/or kV according to patient size and/or use of iterative reconstruction technique. CONTRAST:  OMNIPAQUE  IOHEXOL   300 MG/ML  SOLN COMPARISON:  06/05/2023 FINDINGS: Lower chest: Atelectasis in the lung bases. Hepatobiliary: No focal liver abnormality is seen. No gallstones, gallbladder wall thickening, or biliary dilatation. Pancreas: Unremarkable. No pancreatic ductal dilatation or surrounding inflammatory changes. Spleen: Normal in size without focal abnormality. Adrenals/Urinary Tract: Adrenal glands are unremarkable. Kidneys are normal, without renal calculi, focal lesion, or hydronephrosis. Bladder is unremarkable. Stomach/Bowel: Stomach, small bowel, and colon are not abnormally distended. No wall thickening or inflammatory changes. Appendix is normal. Vascular/Lymphatic: Aortic atherosclerosis. No enlarged abdominal or pelvic lymph nodes. Reproductive: Prostate is unremarkable. Other: Soft tissue infiltration in the right groin region extending medially to the base of the penis. Mild skin thickening. This is likely to represent cellulitis. No loculated collection to suggest any abscesses. Right groin lymph nodes are not pathologically enlarged. No soft tissue gas. No radiopaque soft tissue foreign body. Musculoskeletal: No acute bony abnormalities. IMPRESSION: 1. Infiltration the subcutaneous fat of the right groin extending to the base of the penis medially. This is likely cellulitis. No loculated collections. 2. No evidence of bowel obstruction or inflammation. Appendix is normal. Electronically Signed   By: Elsie Gravely M.D.   On: 11/20/2023 21:52   CARDIAC CATHETERIZATION Result Date: 11/20/2023   Prox RCA-1 lesion is 70% stenosed.   Prox RCA-2 lesion is 70% stenosed.   Mid RCA-1 lesion is 70% stenosed.   Dist RCA lesion is 40% stenosed.   Mid LAD-1 lesion is 80% stenosed.   Mid LAD-2 lesion is 70% stenosed.   Dist LAD-1 lesion is 99% stenosed.   Origin to Insertion lesion is 100% stenosed.   RPDA lesion is 60% stenosed.   RV Branch lesion is 60% stenosed.   Mid RCA-2 lesion is 100% stenosed.   2nd Diag lesion  is 95% stenosed.   Ost LM lesion is 40% stenosed.   Dist LAD-3 lesion is 60% stenosed.   Dist LAD-2 lesion is 70% stenosed.   Prox LAD to Mid LAD lesion is 70% stenosed.   A drug-eluting stent was successfully placed using a STENT ONYX FRONTIER 2.5X30.   A drug-eluting stent was successfully placed using a STENT ONYX FRONTIER 2.25X26.   Balloon angioplasty was performed using a BALLOON [place] TREK NEO RX 3.0X20.   Balloon angioplasty was performed using a BALLOON [place] TREK NEO RX 3.0X20.   Post intervention, there is a 0% residual stenosis.   Post intervention, there is a 0% residual stenosis.   Post intervention, there is a 0% residual stenosis.   Post intervention,  there is a 0% residual stenosis.   Post intervention, there is a 0% residual stenosis.   SVG and is normal in caliber.   SVG.   LIMA and is normal in caliber.   The graft exhibits no disease.   The graft exhibits no disease.   In the absence of any other complications or medical issues, we expect the patient to be ready for discharge from an interventional cardiology perspective on 11/21/2023.   Recommend dual antiplatelet therapy with Aspirin  81mg  daily and Clopidogrel  75mg  daily. 1.  Severe underlying three-vessel coronary artery disease with patent LIMA to diagonal and patent SVG to right PDA.  The SVG to LAD is chronically occluded.  Native LAD has extensive stenting from the proximal to the distal segment with severe in-stent restenosis extending distal to the stent.  The LAD was previously getting flow from the SVG to diagonal but that is no longer the case due to progression of ostial diagonal disease. 2.  Left ventricular angiography was not performed due to chronic kidney disease.  Normal left ventricular end-diastolic pressure. 3. Successful complex angioplasty and 2 overlapped drug-eluting stent placement to the distal LAD.  OCT was used but was not diagnostic due to poor distal flow.  Balloon angioplasty for in-stent restenosis was done to the mid  and proximal stent.  Extensive coronary spasm noted with every stent placement that improved with intracoronary nitroglycerin . Recommendations: I added nitroglycerin  drip at the end of the case.  Not entirely sure why the patient is having such extensive coronary spasm.  I increased the dose of Imdur .  Will check urine drug screen. The LAD stents are high risk for target vessel failure in the future due to extensive stenting.  Unfortunately, the vein graft to the LAD has been occluded and no options for further bypass given very diffuse distal disease.    Cardiac Studies Echo 12/24 EF 55 to 60%  Patient Profile   48 y.o. male with history of CAD/CABG x 3 in 2023, DES mid LAD 2021, HFpEF, hypertension, hyperlipidemia, CKD 3 presenting for an elective cath with symptoms of chest pain.  Left heart cath showed significant native disease, SVG-LAD occluded, patent LIMA to D2, patent SVG to distal RCA.  Patient underwent DES x 2 to LAD on 9/26.  Assessment & Plan   Chest pain, CAD/CABG, s/p DES x 2 to LAD on 9/26. - Currently denies chest pain after stent placement - Continue aspirin , Plavix  75, Zetia  10, Crestor  40. - Toprol -XL 25 mg daily.  2.  Hyperlipidemia - Crestor  40 mg daily, fenofibrate .  3.  Hypertension - BP controlled - Torsemide 25 mg daily, Aldactone  25, losartan  25.  4.  Possible groin infection - CT consistent with cellulitis - Started on antibiotics - Hospitalist input appreciated    Signed, Redell Cave, MD  11/21/2023, 4:53 PM

## 2023-11-21 NOTE — Consult Note (Addendum)
 Initial Consultation Note   Patient: Logan Crawford FMW:[id number] DOB: [birth year] PCP: Paseda, Folashade R, FNP DOA: 11/20/2023 DOS: the patient was seen and examined on 11/21/2023 Primary service: Darron Deatrice LABOR, MD   Referring physician: Darron Deatrice LABOR, MD  Reason for consult: Cellulitis, take over his primary  HPI/Course: [name] is a 48 y.o. male with medical history significant of hypertension, hyperlipidemia, CKD 3A, GERD, CAD, CHF, obesity, OSA, substance use who presented for left heart cath and developed changes concerning for groin cellulitis.  Patient was admitted by cardiology yesterday due to unstable angina with worsening chest pain and fatigue in the setting of known advanced CAD status post percutaneous intervention and CABG.  Patient underwent successful catheterization with stent placement x 3 and angioplasty x 2-3.  Postoperatively and nurse noted a painful right groin mass and as patient was in the ICU this was discussed with the nurse practitioner.  Patient has history of this right groin mass that was last noted in the chart in 2021.  At that time he had had a painful intermittently draining lesion there for years.  Was treated for cellulitis with plan for follow-up for possible excision.  Patient reports that he was ultimately seen by urology and had this cyst excised from the base of his penis last year.  He reports a lesion today has developed over the past couple days and is in a different location than the previous lesion.  CT ordered by primary team and consultation with CCM NP overnight noted to show infiltration of subcutaneous fat of the right groin extending to the penis medially likely representing cellulitis.  No evidence of loculation to indicate abscess and no gas.  Patient started on Ancef  overnight and hospitalist team has been requested to take over as primary today for management of this patient's new issue.  Patient reports persistent  pain and swelling in the area for the past couple days.  Has seemed to be worsening.  Patient denies fever, chills, chest pain, shortness of breath, abdominal pain, constipation, diarrhea, nausea, vomiting.  Review of Systems: As per HPI otherwise all other systems reviewed and are negative.  Past Medical History:  Diagnosis Date   (HFpEF) heart failure with preserved ejection fraction (HCC)    a.) TTE 11/26/19: EF 50-55, G1DD; b.) TTE 01/24/20: EF 50, LVH, mid-apical antsep HK, G1DD; c.) TTE 05/18/21: EF 50-55, antsep/sep/apical HK, LVH, mild MR, AoV calc; d.) TTE 07/16/21: EF 60-65, LVH, G2DD, mild MD; e.) TTE 02/18/22: EF 55, sep/inf HK, LVH, G1DD, mild MR; f.) TTE 03/19/22: EF 65-70, antsep/periapical HK, LVH, G1DD, mild MR, AoV calc; g.) TTE 02/12/2023: EF 55-60, LVH, G2DD, mild MR   Anginal pain    Aortic atherosclerosis    Bilateral pleural effusion 02/20/2022   a.) s/p CABG and LAA closure (L > R) --> progressive during admission --> ultimately required LEFT thoracentesis on 03/10/2022 (750 cc serous yield)   CAD (coronary artery disease)    a.)PCI 01/24/20: 90 mLAD (2.75x22 Res Onyx); b.)PCI 05/20/21: 85 mLAD (2.75x24 + 2.5x8 Onyx); c.)LHC 11/18/21: 80 ISR + 60 mLAD - CVTS vs stent POBA; d.)LHC 02/13/22: 70 ISR p-mLAD, 80/50 ISR mLAD, 99 ISR dLAD, 80 jailed D2, diff HG RCA Dz; e.)3v CABG 02/19/22; f.)LHC 02/03/23: 30 LM, 70 p-mLAD, 80/70 mLAD, 99 dLAD, 80 D2, 70/70 pRCA, 70/95 mRCA, 40 dRCA, 60 RV branch, 60 RPDA, 100 SVG-LAD - Rx mgmt   CKD (chronic kidney disease), stage III (HCC)    Complete heart  block (HCC) 02/19/2022   a.) during CABG/LAA closure --> hyperkalemic CHB in OR before heart surgery had functionally begun (not on bypass yet); etiology unknown; ?? due to medication administration or contaminant given fairly normal preop renal function (Cr 1.3). Persisted post-bypass event with extensive dialysis equivalent via CPB pump --> Tx'd with dantrolene    Complication of anesthesia  02/19/2022   a.) perioperative HYPERkalemia - concern for atypical MH initially - Tx'd intraoperaitvely with dantrolene . Preop K+ 4.6 and CR 1.3 --> K+ peaked at 7.8 intra/postop --> rec'd furosemide  + insulin  + calcium  + extended CBP (remained on pump at least 1 hour after procedure). Consult with MH society determined unlikely MH (see Argwala, MD note 02/22/2022). No confirmatory CHCT testing.   COPD (chronic obstructive pulmonary disease) (HCC)    COVID-19 virus infection 11/25/2019   Genital warts    GERD (gastroesophageal reflux disease)    Hiatal hernia    History of 2019 novel coronavirus disease (COVID-19) 11/25/2019   History of left atrial appendage closure 02/19/2022   a.) performed simultaneously with cardiac revascularization (CABG) procedure on 02/19/2022   Hyperlipidemia    Hypertension    Ischemic cardiomyopathy    a.) TTE 11/26/19: EF 50-55; b.)TTE 01/24/20: EF 50; c.)LHC 01/24/20: EF 45-50; d.)MV 11/29/20: EF 43; e.)TTE 05/18/21: EF 50-55; f.)LHC 05/20/21: EF 50-55; g.)TTE 07/16/21: EF 60-65; h.) TTE 11/16/21: EF 60-65; i.) LHC 11/18/21: EF 55-65; j.) TTE 02/12/22: EF 60-65; k.) TTE 02/18/22: EF 55; l.) TTE 03/19/22: EF 65-70; m.) TTE 05/01/22: EF 55; n.) MV 01/27/23: EF 45-50; o.) TTE 02/12/2023: EF 55-60   Leukocytosis    Long term current use of clopidogrel     Long-term use of aspirin  therapy    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   a.) surgical PCR (+) 02/15/2022 prior to CABG   NSTEMI (non-ST elevated myocardial infarction) (HCC) 01/23/2020   a.) troponins were trended: 3520 --> 2990 ng/L; b.) LHC/PCI 01/24/2020: 60% o-pRCA, 40% p-mRCA, 90% mLAD (2.75 x 22 mm Resolute Onyx DES), 40% dLAD   NSTEMI (non-ST elevated myocardial infarction) (HCC) 05/18/2021   a.) troponins were trended: 101 --> 110 --> 175 ng/L; b.) LHC/PCI 05/20/2021: 40% dLAD, 40% p-mRCA, 50% o-pRCA, 40% RPDA, 30% oLM, 85% mLAD with distal edge dissection (overlapping 2.75 x 34 mm and 2.5 x 8 mm Onyx  [place] DES), 60% D2   OSA on CPAP    Polysubstance abuse (HCC)    a.) cocaine, THC, opiates, tobacco   Postoperative cardiogenic shock 02/19/2022   a.) required vasopressor/ionotropic agent support + IABP   Recurrent cellulitis of thigh    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 Consult with MH society determined MH unlikely (see Argwala, MD note 02/22/2022); b.) c/b postop cardiogenic shock req vasopressor/inotrophic support + IABP; c.) c/b dev of BILATERAL pleural effusions (L > R) req LEFT thoracentesis (750 cc yield)   Scrotal lesion (sebaceous cyst)    Tobacco use 11/25/2019    Past Surgical History:  Procedure Laterality Date   CORONARY ARTERY BYPASS GRAFT N/A 02/19/2022   Procedure: CORONARY ARTERY BYPASS GRAFTING (CABG) X THREE, USING ENDOSCOPICALLY HARVESTED RIGHT GREATER SAPHENOUS VEIN  AND LEFT ATRIAL APPENDAGE CLIPPING;  Surgeon: Murriel Toribio DEL, MD;  Location: [facility];  Service: Open Heart Surgery;  Laterality: N/A;   CORONARY STENT INTERVENTION N/A 01/24/2020   Procedure: CORONARY STENT INTERVENTION;  Surgeon: Darron Grass  A, MD;  Location: [facility];  Service: Cardiovascular;  Laterality: N/A;   CORONARY STENT INTERVENTION N/A 05/20/2021   Procedure: CORONARY STENT INTERVENTION;  Surgeon: Darron Deatrice LABOR, MD;  Location: [facility];  Service: Cardiovascular;  Laterality: N/A;   CORONARY ULTRASOUND/IVUS N/A 05/20/2021   Procedure: Intravascular Ultrasound/IVUS;  Surgeon: Darron Deatrice LABOR, MD;  Location: [facility];  Service: Cardiovascular;  Laterality: N/A;   IRRIGATION AND DEBRIDEMENT KNEE Left    LEFT ATRIAL APPENDAGE OCCLUSION Left 02/19/2022   Procedure: LEFT ATRIAL APPENDAGE OCCLUSION; Location: [facility]; Surgeon: Toribio Colder, MD   LEFT HEART CATH AND CORONARY ANGIOGRAPHY N/A 01/24/2020    Procedure: LEFT HEART CATH AND CORONARY ANGIOGRAPHY;  Surgeon: Darron Deatrice LABOR, MD;  Location: [facility];  Service: Cardiovascular;  Laterality: N/A;   LEFT HEART CATH AND CORONARY ANGIOGRAPHY N/A 11/18/2021   Procedure: LEFT HEART CATH AND CORONARY ANGIOGRAPHY;  Surgeon: Darron Deatrice LABOR, MD;  Location: [facility];  Service: Cardiovascular;  Laterality: N/A;   LEFT HEART CATH AND CORONARY ANGIOGRAPHY N/A 02/13/2022   Procedure: LEFT HEART CATH AND CORONARY ANGIOGRAPHY;  Surgeon: Anner Alm ORN, MD;  Location: [facility];  Service: Cardiovascular;  Laterality: N/A;   LEFT HEART CATH AND CORS/GRAFTS ANGIOGRAPHY N/A 05/20/2021   Procedure: LEFT HEART CATH AND CORS/GRAFTS ANGIOGRAPHY;  Surgeon: Darron Deatrice LABOR, MD;  Location: [facility];  Service: Cardiovascular;  Laterality: N/A;   LEFT HEART CATH AND CORS/GRAFTS ANGIOGRAPHY N/A 02/03/2023   Procedure: LEFT HEART CATH AND CORS/GRAFTS ANGIOGRAPHY;  Surgeon: Mady Bruckner, MD;  Location: [facility];  Service: Cardiovascular;  Laterality: N/A;   SCROTAL EXPLORATION N/A 03/06/2023   Procedure: EXCISION OF SCROTUM LESION, SEBACEOUS CYST;  Surgeon: Francisca Redell BROCKS, MD;  Location: [facility];  Service: Urology;  Laterality: N/A;   TYMPANOSTOMY TUBE PLACEMENT Bilateral     Social History  reports that he has been smoking cigarettes. He started smoking about 10 years ago. He has a 10.8 pack-year smoking history. He has been exposed to tobacco smoke. He has never used smokeless tobacco. He reports current alcohol use of about 1.0 standard drink of alcohol per week. He reports current drug use.  Allergies  Allergen Reactions   Bee Pollen     Patient states he is not allergic to bees but has seasonal allergies to pollen    Family History  Problem Relation Age of Onset   Coronary artery disease Mother    Arrhythmia Mother        Pacemaker placed in 2021   Heart failure Mother    Breast cancer  Mother    Coronary artery disease Father    Colon cancer Father    Colon polyps Father   Reviewed on admission  Prior to Admission medications   Medication Sig Start Date End Date Taking? Authorizing Provider  aspirin  EC 81 MG tablet Take 1 tablet (81 mg total) by mouth daily. Swallow whole. 08/26/23  Yes Wittenborn, Barnie, NP  clopidogrel  (PLAVIX ) 75 MG tablet Take 1 tablet (75 mg total) by mouth daily. 08/26/23  Yes Wittenborn, Barnie, NP  empagliflozin  (JARDIANCE ) 10 MG TABS tablet Take 1 tablet (10 mg total) by mouth daily. 08/26/23  Yes Wittenborn, Barnie, NP  ezetimibe  (ZETIA ) 10 MG tablet Take 1 tablet (10 mg total) by mouth daily. 08/26/23 08/25/24 Yes Paseda, Folashade R, FNP  Fluticasone -Umeclidin-Vilant (TRELEGY ELLIPTA ) 100-62.5-25 MCG/ACT AEPB Inhale 1 puff into the lungs daily.  08/26/23  Yes Paseda, Folashade R, FNP  furosemide  (LASIX ) 40 MG tablet Take 1 tablet (40 mg total) by mouth daily. 08/26/23  Yes Wittenborn, Barnie, NP  isosorbide  mononitrate (IMDUR ) 30 MG 24 hr tablet Take 1 tablet (30 mg total) by mouth daily. 04/10/23 04/09/24 Yes Bensimhon, Toribio SAUNDERS, MD  losartan  (COZAAR ) 50 MG tablet Take 1 tablet (50 mg total) by mouth daily. 08/26/23  Yes Wittenborn, Barnie, NP  metoprolol  succinate (TOPROL -XL) 25 MG 24 hr tablet Take 1 tablet (25 mg total) by mouth 2 (two) times daily. 08/26/23  Yes Wittenborn, Deborah, NP  nicotine  (NICODERM CQ  - DOSED IN MG/24 HOURS) 14 mg/24hr patch Place 1 patch (14 mg total) onto the skin daily. Patient taking differently: Place 14 mg onto the skin daily as needed (nicotine  dependence). 08/24/23  Yes Paseda, Folashade R, FNP  nitroGLYCERIN  (NITROSTAT ) 0.4 MG SL tablet Place 1 tablet (0.4 mg total) under the tongue every 5 (five) minutes as needed for chest pain (if chest pain not resolved after 3 doses, please call 911). 02/03/23  Yes End, Lonni, MD  pantoprazole  (PROTONIX ) 40 MG tablet Take 1 tablet (40 mg total) by mouth daily. 08/26/23  Yes Paseda,  Folashade R, FNP  rosuvastatin  (CRESTOR ) 40 MG tablet Take 1 tablet (40 mg total) by mouth daily. 08/26/23  Yes Paseda, Folashade R, FNP  spironolactone  (ALDACTONE ) 25 MG tablet Take 1 tablet (25 mg total) by mouth daily. 08/26/23  Yes Wittenborn, Barnie, NP  VENTOLIN  HFA 108 (90 Base) MCG/ACT inhaler Inhale 1-2 puffs into the lungs every 6 (six) hours as needed for wheezing or shortness of breath. 08/24/23  Yes Paseda, Folashade R, FNP  famotidine  (PEPCID ) 20 MG tablet Take 1 tablet (20 mg total) by mouth at bedtime as needed for heartburn or indigestion. Patient not taking: Reported on 11/19/2023 10/20/22   Paseda, Folashade R, FNP  fenofibrate  (TRICOR ) 48 MG tablet Take 1 tablet (48 mg total) by mouth daily. 09/08/23 09/07/24  Paseda, Folashade R, FNP  hydrocortisone  (ANUSOL -HC) 25 MG suppository Place 1 suppository (25 mg total) rectally 2 (two) times daily. Patient not taking: Reported on 11/19/2023 07/29/23   Paseda, Folashade R, FNP  nicotine  (NICODERM CQ  - DOSED IN MG/24 HR) 7 mg/24hr patch Place 1 patch (7 mg total) onto the skin daily. Patient not taking: Reported on 11/19/2023 10/05/23   Paseda, Folashade R, FNP  NON FORMULARY Pt uses a bi-pap nightly    [provider]  polyethylene glycol (MIRALAX ) 17 g packet Take 17 g by mouth daily as needed. 07/29/23   Paseda, Folashade R, FNP    Physical Exam: Vitals:   11/21/23 0800 11/21/23 0900 11/21/23 1000 11/21/23 1100  BP: (!) 141/85 126/80 127/74 125/85  Pulse: 83 78 89 77  Resp: 15 20 18 14   Temp:      TempSrc:      SpO2: 92% 96% 92% 95%  Weight:      Height:        Physical Exam Constitutional:      General: He is not in acute distress.    Appearance: Normal appearance.  HENT:     Head: Normocephalic and atraumatic.     Mouth/Throat:     Mouth: Mucous membranes are moist.     Pharynx: Oropharynx is clear.  Eyes:     Extraocular Movements: Extraocular movements intact.     Pupils: Pupils are equal, round, and reactive to  light.  Cardiovascular:     Rate and Rhythm: Normal rate  and regular rhythm.     Pulses: Normal pulses.     Heart sounds: Normal heart sounds.  Pulmonary:     Effort: Pulmonary effort is normal. No respiratory distress.     Breath sounds: Normal breath sounds.  Abdominal:     General: Bowel sounds are normal. There is no distension.     Palpations: Abdomen is soft.     Tenderness: There is no abdominal tenderness.  Genitourinary:    Comments: Fluctuance and induration at right inguinal region extending to base of penis. Musculoskeletal:        General: No swelling or deformity.  Skin:    General: Skin is warm and dry.  Neurological:     General: No focal deficit present.     Mental Status: Mental status is at baseline.    Labs on Admission: I have personally reviewed following labs and imaging studies  CBC: Recent Labs  Lab 11/18/23 1548 11/20/23 1652 11/21/23 0558  WBC 10.4 12.0* 12.3*  HGB 16.1 14.6 14.4  HCT 51.1* 43.3 42.4  MCV 89 82.2 81.2  PLT 369 274 296    Basic Metabolic Panel: Recent Labs  Lab 11/18/23 1548 11/20/23 1652 11/21/23 0558  NA 140  --  137  K 4.3  --  3.8  CL 101  --  106  CO2 23  --  20*  GLUCOSE 76  --  97  BUN 9  --  13  CREATININE 1.57* 1.27* 1.21  CALCIUM  10.0  --  8.3*    GFR: Estimated Creatinine Clearance: 94.3 mL/min (by C-G formula based on SCr of 1.21 mg/dL).  Liver Function Tests: No results for input(s): AST, ALT, ALKPHOS, BILITOT, PROT, ALBUMIN  in the last 168 hours.  Urine analysis:    Component Value Date/Time   COLORURINE YELLOW 03/25/2022 1018   APPEARANCEUR Clear 07/06/2023 1352   LABSPEC 1.015 03/25/2022 1018   LABSPEC 1.015 06/30/2013 1513   PHURINE 6.0 03/25/2022 1018   GLUCOSEU 3+ (A) 07/06/2023 1352   GLUCOSEU Negative 06/30/2013 1513   HGBUR SMALL (A) 03/25/2022 1018   BILIRUBINUR Negative 07/06/2023 1352   BILIRUBINUR Negative 06/30/2013 1513   KETONESUR NEGATIVE 03/25/2022 1018    PROTEINUR Negative 07/06/2023 1352   PROTEINUR NEGATIVE 03/25/2022 1018   UROBILINOGEN 0.2 03/17/2022 1158   NITRITE Negative 07/06/2023 1352   NITRITE NEGATIVE 03/25/2022 1018   LEUKOCYTESUR Negative 07/06/2023 1352   LEUKOCYTESUR NEGATIVE 03/25/2022 1018   LEUKOCYTESUR Negative 06/30/2013 1513    Radiological Exams on Admission: CT ABDOMEN PELVIS W CONTRAST Result Date: 11/20/2023 CLINICAL DATA:  Right lower quadrant abdominal pain. Right groin abscess. EXAM: CT ABDOMEN AND PELVIS WITH CONTRAST TECHNIQUE: Multidetector CT imaging of the abdomen and pelvis was performed using the standard protocol following bolus administration of intravenous contrast. RADIATION DOSE REDUCTION: This exam was performed according to the departmental dose-optimization program which includes automated exposure control, adjustment of the mA and/or kV according to patient size and/or use of iterative reconstruction technique. CONTRAST:  OMNIPAQUE  IOHEXOL  300 MG/ML  SOLN COMPARISON:  06/05/2023 FINDINGS: Lower chest: Atelectasis in the lung bases. Hepatobiliary: No focal liver abnormality is seen. No gallstones, gallbladder wall thickening, or biliary dilatation. Pancreas: Unremarkable. No pancreatic ductal dilatation or surrounding inflammatory changes. Spleen: Normal in size without focal abnormality. Adrenals/Urinary Tract: Adrenal glands are unremarkable. Kidneys are normal, without renal calculi, focal lesion, or hydronephrosis. Bladder is unremarkable. Stomach/Bowel: Stomach, small bowel, and colon are not abnormally distended. No wall thickening or inflammatory changes. Appendix  is normal. Vascular/Lymphatic: Aortic atherosclerosis. No enlarged abdominal or pelvic lymph nodes. Reproductive: Prostate is unremarkable. Other: Soft tissue infiltration in the right groin region extending medially to the base of the penis. Mild skin thickening. This is likely to represent cellulitis. No loculated collection to suggest  any abscesses. Right groin lymph nodes are not pathologically enlarged. No soft tissue gas. No radiopaque soft tissue foreign body. Musculoskeletal: No acute bony abnormalities. IMPRESSION: 1. Infiltration the subcutaneous fat of the right groin extending to the base of the penis medially. This is likely cellulitis. No loculated collections. 2. No evidence of bowel obstruction or inflammation. Appendix is normal. Electronically Signed   By: Elsie Gravely M.D.   On: 11/20/2023 21:52   CARDIAC CATHETERIZATION Result Date: 11/20/2023   Prox RCA-1 lesion is 70% stenosed.   Prox RCA-2 lesion is 70% stenosed.   Mid RCA-1 lesion is 70% stenosed.   Dist RCA lesion is 40% stenosed.   Mid LAD-1 lesion is 80% stenosed.   Mid LAD-2 lesion is 70% stenosed.   Dist LAD-1 lesion is 99% stenosed.   Origin to Insertion lesion is 100% stenosed.   RPDA lesion is 60% stenosed.   RV Branch lesion is 60% stenosed.   Mid RCA-2 lesion is 100% stenosed.   2nd Diag lesion is 95% stenosed.   Ost LM lesion is 40% stenosed.   Dist LAD-3 lesion is 60% stenosed.   Dist LAD-2 lesion is 70% stenosed.   Prox LAD to Mid LAD lesion is 70% stenosed.   A drug-eluting stent was successfully placed using a STENT ONYX FRONTIER 2.5X30.   A drug-eluting stent was successfully placed using a STENT ONYX FRONTIER 2.25X26.   Balloon angioplasty was performed using a BALLOON [place] TREK NEO RX 3.0X20.   Balloon angioplasty was performed using a BALLOON [place] TREK NEO RX 3.0X20.   Post intervention, there is a 0% residual stenosis.   Post intervention, there is a 0% residual stenosis.   Post intervention, there is a 0% residual stenosis.   Post intervention, there is a 0% residual stenosis.   Post intervention, there is a 0% residual stenosis.   SVG and is normal in caliber.   SVG.   LIMA and is normal in caliber.   The graft exhibits no disease.   The graft exhibits no disease.   In the absence of any other complications or medical issues, we expect the patient to  be ready for discharge from an interventional cardiology perspective on 11/21/2023.   Recommend dual antiplatelet therapy with Aspirin  81mg  daily and Clopidogrel  75mg  daily. 1.  Severe underlying three-vessel coronary artery disease with patent LIMA to diagonal and patent SVG to right PDA.  The SVG to LAD is chronically occluded.  Native LAD has extensive stenting from the proximal to the distal segment with severe in-stent restenosis extending distal to the stent.  The LAD was previously getting flow from the SVG to diagonal but that is no longer the case due to progression of ostial diagonal disease. 2.  Left ventricular angiography was not performed due to chronic kidney disease.  Normal left ventricular end-diastolic pressure. 3. Successful complex angioplasty and 2 overlapped drug-eluting stent placement to the distal LAD.  OCT was used but was not diagnostic due to poor distal flow.  Balloon angioplasty for in-stent restenosis was done to the mid and proximal stent.  Extensive coronary spasm noted with every stent placement that improved with intracoronary nitroglycerin . Recommendations: I added nitroglycerin  drip at the end  of the case.  Not entirely sure why the patient is having such extensive coronary spasm.  I increased the dose of Imdur .  Will check urine drug screen. The LAD stents are high risk for target vessel failure in the future due to extensive stenting.  Unfortunately, the vein graft to the LAD has been occluded and no options for further bypass given very diffuse distal disease.   EKG: Independently reviewed. Most recent EKG was yesterday which showed sinus rhythm at 80 beats minute.  Nonspecific T wave changes.  Assessment/Plan Principal Problem:   Unstable angina (HCC) Active Problems:   Chest pain   CAD (coronary artery disease)   Stage 3a chronic kidney disease (HCC)   Polysubstance abuse (HCC)   Hypertension   Hyperlipidemia   Chronic diastolic CHF (congestive heart failure)  (HCC)   Obesity with body mass index (BMI) of 30.0 to 39.9   GERD (gastroesophageal reflux disease)   S/P CABG x 3   Other emphysema (HCC)   Scrotal cyst   OSA (obstructive sleep apnea)   Cellulitis of groin, right   Right groin cellulitis History of right groin cyst/lesion > Noted to have painful right groin mass overnight.  CT evaluation showed evidence of cellulitis without evidence of loculation or gas.  Leukocytosis to 12.3.  Procalcitonin negative. > During previous evaluation it was noted that patient had had some mass with some drainage there for years.  Was treated for cellulitis in the past with plan for possible excision for this lesion/cyst/other. > Patient started on Ancef . Not much improved today, will add Vanc and MRSA swab. - Continue monitor in stepdown unit for now - Will consult with urology  - Vancomycin  and ceftriaxone for now - MRSA screen - Trend fever curve and WBC - Supportive care  CAD Status post recent PCI History of CABG > Initially presented for left heart cath and possible intervention due to unstable angina in the setting of advanced CAD. > Underwent successful left heart cath yesterday with stent placement x 3 and angioplasty x 2-3. - Appreciate cardiology recommendations and assistance - Continue home ASA, Plavix  - Continue rosuvastatin , Zetia , fenofibrate  - Continue home Imdur  - Continue home losartan , metoprolol   Chronic diastolic CHF > Last echo was in 2024 for and showed EF 55-60%, G2 DD, normal RV function. - Continue home Lasix , spironolactone , metoprolol , losartan  and - Jardiance  has been held - I's and O's, daily weights  Hypertension - Continue home Lasix , spironolactone , metoprolol , losartan   Hyperlipidemia - Continue home rosuvastatin , Zetia , fenofibrate   CKD 3A - Creatinine stable, trend renal function and electrolytes  GERD - Continue Pepcid , PPI  OSA - Continue home BiPAP  Obesity - Noted  Substance use >  Reportedly no cocaine use for 7-8 months.  DVT prophylaxis: SCDs for now, post-cath and likely surgical evaluation Code Status:   Full Family Communication:  None on admission Disposition Plan:   Patient is from:  Home  Anticipated DC to:  Home  Anticipated DC date:  1 to 2 days  Anticipated DC barriers: None  Consultants:  Cardiology Current status:  Observation, stepdown  [facility] will take over as primary for this patient.  Marsa KATHEE Scurry MD [facility]  How to contact the [facility] Attending or Consulting provider 7A - 7P or covering provider during after hours 7P -7A, for this patient?   Check the care team in [facility] and look for a) attending/consulting [facility] provider listed and b) the [facility] team listed Log into [email or web address] and use  [facility] universal password to access. If you do not have the password, please contact the hospital operator. Locate the [facility] provider you are looking for under [facility] and page to a number that you can be directly reached. If you still have difficulty reaching the provider, please page the [facility] (Director on Call) for the Hospitalists listed on amion for assistance.  11/21/2023, 12:40 PM

## 2023-11-21 NOTE — Progress Notes (Addendum)
 The patient was found to have right groin cellulitis with concerns for Fournier's gangrene.  He was started on cefazolin  by critical care. I discontinued Jardiance . Recommend transferring to the hospitalist service in the morning and consulting general surgery.

## 2023-11-21 NOTE — Consult Note (Signed)
 Subjective:    Consult requested by Dr.  [name] is a 48 yo male who is admitted for unstable angina requiring acute stent insertion.  He is a patient of Dr. Francisca and had excision of scrotal sebaceous cysts earlier this year.  He was seen in July for voiding symptmos and microhematuria.  He deferred cystoscopy at that time.   I was consulted for the finding of right inguinal tenderness and swelling with CT evidence of cellulitis without a clear abscess.  There was a concern that the inflammation was involving the penile tissues.   He has had no fever and is voiding well.  He was started on Ancef  but hasn't noticed a decline in the swelling.  He has a mild leukocytosis.   ROS:  Review of Systems  Constitutional:  Negative for chills and fever.  Genitourinary: Negative.     Allergies  Allergen Reactions   Bee Pollen     Patient states he is not allergic to bees but has seasonal allergies to pollen    Past Medical History:  Diagnosis Date   (HFpEF) heart failure with preserved ejection fraction (HCC)    a.) TTE 11/26/19: EF 50-55, G1DD; b.) TTE 01/24/20: EF 50, LVH, mid-apical antsep HK, G1DD; c.) TTE 05/18/21: EF 50-55, antsep/sep/apical HK, LVH, mild MR, AoV calc; d.) TTE 07/16/21: EF 60-65, LVH, G2DD, mild MD; e.) TTE 02/18/22: EF 55, sep/inf HK, LVH, G1DD, mild MR; f.) TTE 03/19/22: EF 65-70, antsep/periapical HK, LVH, G1DD, mild MR, AoV calc; g.) TTE 02/12/2023: EF 55-60, LVH, G2DD, mild MR   Anginal pain    Aortic atherosclerosis    Bilateral pleural effusion 02/20/2022   a.) s/p CABG and LAA closure (L > R) --> progressive during admission --> ultimately required LEFT thoracentesis on 03/10/2022 (750 cc serous yield)   CAD (coronary artery disease)    a.)PCI 01/24/20: 90 mLAD (2.75x22 Res Onyx); b.)PCI 05/20/21: 85 mLAD (2.75x24 + 2.5x8 Onyx); c.)LHC 11/18/21: 80 ISR + 60 mLAD - CVTS vs stent POBA; d.)LHC 02/13/22: 70 ISR p-mLAD, 80/50 ISR mLAD, 99 ISR dLAD,  80 jailed D2, diff HG RCA Dz; e.)3v CABG 02/19/22; f.)LHC 02/03/23: 30 LM, 70 p-mLAD, 80/70 mLAD, 99 dLAD, 80 D2, 70/70 pRCA, 70/95 mRCA, 40 dRCA, 60 RV branch, 60 RPDA, 100 SVG-LAD - Rx mgmt   CKD (chronic kidney disease), stage III (HCC)    Complete heart block (HCC) 02/19/2022   a.) during CABG/LAA closure --> hyperkalemic CHB in OR before heart surgery had functionally begun (not on bypass yet); etiology unknown; ?? due to medication administration or contaminant given fairly normal preop renal function (Cr 1.3). Persisted post-bypass event with extensive dialysis equivalent via CPB pump --> Tx'd with dantrolene    Complication of anesthesia 02/19/2022   a.) perioperative HYPERkalemia - concern for atypical MH initially - Tx'd intraoperaitvely with dantrolene . Preop K+ 4.6 and CR 1.3 --> K+ peaked at 7.8 intra/postop --> rec'd furosemide  + insulin  + calcium  + extended CBP (remained on pump at least 1 hour after procedure). Consult with MH society determined unlikely MH (see Argwala, MD note 02/22/2022). No confirmatory CHCT testing.   COPD (chronic obstructive pulmonary disease) (HCC)    COVID-19 virus infection 11/25/2019   Genital warts    GERD (gastroesophageal reflux disease)    Hiatal hernia    History of 2019 novel coronavirus disease (COVID-19) 11/25/2019   History of left atrial appendage closure 02/19/2022   a.) performed simultaneously with cardiac revascularization (CABG) procedure on 02/19/2022  Hyperlipidemia    Hypertension    Ischemic cardiomyopathy    a.) TTE 11/26/19: EF 50-55; b.)TTE 01/24/20: EF 50; c.)LHC 01/24/20: EF 45-50; d.)MV 11/29/20: EF 43; e.)TTE 05/18/21: EF 50-55; f.)LHC 05/20/21: EF 50-55; g.)TTE 07/16/21: EF 60-65; h.) TTE 11/16/21: EF 60-65; i.) LHC 11/18/21: EF 55-65; j.) TTE 02/12/22: EF 60-65; k.) TTE 02/18/22: EF 55; l.) TTE 03/19/22: EF 65-70; m.) TTE 05/01/22: EF 55; n.) MV 01/27/23: EF 45-50; o.) TTE 02/12/2023: EF 55-60   Leukocytosis    Long term  current use of clopidogrel     Long-term use of aspirin  therapy    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   a.) surgical PCR (+) 02/15/2022 prior to CABG   NSTEMI (non-ST elevated myocardial infarction) (HCC) 01/23/2020   a.) troponins were trended: 3520 --> 2990 ng/L; b.) LHC/PCI 01/24/2020: 60% o-pRCA, 40% p-mRCA, 90% mLAD (2.75 x 22 mm Resolute Onyx DES), 40% dLAD   NSTEMI (non-ST elevated myocardial infarction) (HCC) 05/18/2021   a.) troponins were trended: 101 --> 110 --> 175 ng/L; b.) LHC/PCI 05/20/2021: 40% dLAD, 40% p-mRCA, 50% o-pRCA, 40% RPDA, 30% oLM, 85% mLAD with distal edge dissection (overlapping 2.75 x 34 mm and 2.5 x 8 mm Onyx Frontier DES), 60% D2   OSA on CPAP    Polysubstance abuse (HCC)    a.) cocaine, THC, opiates, tobacco   Postoperative cardiogenic shock 02/19/2022   a.) required vasopressor/ionotropic agent support + IABP   Recurrent cellulitis of thigh    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 Consult with MH society determined MH unlikely (see Argwala, MD note 02/22/2022); b.) c/b postop cardiogenic shock req vasopressor/inotrophic support + IABP; c.) c/b dev of BILATERAL pleural effusions (L > R) req LEFT thoracentesis (750 cc yield)   Scrotal lesion (sebaceous cyst)    Tobacco use 11/25/2019    Past Surgical History:  Procedure Laterality Date   CORONARY ARTERY BYPASS GRAFT N/A 02/19/2022   Procedure: CORONARY ARTERY BYPASS GRAFTING (CABG) X THREE, USING ENDOSCOPICALLY HARVESTED RIGHT GREATER SAPHENOUS VEIN  AND LEFT ATRIAL APPENDAGE CLIPPING;  Surgeon: Murriel Toribio DEL, MD;  Location: [facility] OR;  Service: Open Heart Surgery;  Laterality: N/A;   CORONARY STENT INTERVENTION N/A 01/24/2020   Procedure: CORONARY STENT INTERVENTION;  Surgeon: Darron Deatrice LABOR, MD;  Location: [facility] INVASIVE CV LAB;  Service: Cardiovascular;  Laterality: N/A;    CORONARY STENT INTERVENTION N/A 05/20/2021   Procedure: CORONARY STENT INTERVENTION;  Surgeon: Darron Deatrice LABOR, MD;  Location: [facility] INVASIVE CV LAB;  Service: Cardiovascular;  Laterality: N/A;   CORONARY ULTRASOUND/IVUS N/A 05/20/2021   Procedure: Intravascular Ultrasound/IVUS;  Surgeon: Darron Deatrice LABOR, MD;  Location: [facility] INVASIVE CV LAB;  Service: Cardiovascular;  Laterality: N/A;   IRRIGATION AND DEBRIDEMENT KNEE Left    LEFT ATRIAL APPENDAGE OCCLUSION Left 02/19/2022   Procedure: LEFT ATRIAL APPENDAGE OCCLUSION; Location: [facility]; Surgeon: Toribio Murriel, MD   LEFT HEART CATH AND CORONARY ANGIOGRAPHY N/A 01/24/2020   Procedure: LEFT HEART CATH AND CORONARY ANGIOGRAPHY;  Surgeon: Darron Deatrice LABOR, MD;  Location: [facility] INVASIVE CV LAB;  Service: Cardiovascular;  Laterality: N/A;   LEFT HEART CATH AND CORONARY ANGIOGRAPHY N/A 11/18/2021   Procedure: LEFT HEART CATH AND CORONARY ANGIOGRAPHY;  Surgeon: Darron Deatrice LABOR, MD;  Location: [facility] INVASIVE CV [facility];  Service: Cardiovascular;  Laterality: N/A;   LEFT HEART CATH AND CORONARY ANGIOGRAPHY N/A 02/13/2022  Procedure: LEFT HEART CATH AND CORONARY ANGIOGRAPHY;  Surgeon: Anner Alm ORN, MD;  Location: [facility] INVASIVE CV LAB;  Service: Cardiovascular;  Laterality: N/A;   LEFT HEART CATH AND CORS/GRAFTS ANGIOGRAPHY N/A 05/20/2021   Procedure: LEFT HEART CATH AND CORS/GRAFTS ANGIOGRAPHY;  Surgeon: Darron Deatrice LABOR, MD;  Location: [facility] INVASIVE CV [facility];  Service: Cardiovascular;  Laterality: N/A;   LEFT HEART CATH AND CORS/GRAFTS ANGIOGRAPHY N/A 02/03/2023   Procedure: LEFT HEART CATH AND CORS/GRAFTS ANGIOGRAPHY;  Surgeon: Mady Bruckner, MD;  Location: [facility] INVASIVE CV LAB;  Service: Cardiovascular;  Laterality: N/A;   SCROTAL EXPLORATION N/A 03/06/2023   Procedure: EXCISION OF SCROTUM LESION, SEBACEOUS CYST;  Surgeon: Francisca Redell BROCKS, MD;  Location: [facility] ORS;  Service: Urology;  Laterality: N/A;   TYMPANOSTOMY TUBE PLACEMENT Bilateral      Social History   Socioeconomic History   Marital status: Single    Spouse name: Not on file   Number of children: 3   Years of education: Not on file   Highest education level: Not on file  Occupational History   Not on file  Tobacco Use   Smoking status: Some Days    Current packs/day: 1.00    Average packs/day: 1 pack/day for 10.8 years (10.8 ttl pk-yrs)    Types: Cigarettes    Start date: 02/12/2013    Passive exposure: Past   Smokeless tobacco: Never   Tobacco comments:    2-3 cigarettes daily- 08/06/2022        1/2 pack daily as of 01/2023  Vaping Use   Vaping status: Never Used  Substance and Sexual Activity   Alcohol use: Yes    Alcohol/week: 1.0 standard drink of alcohol    Types: 1 Shots of liquor per week    Comment: socially   Drug use: Yes    Comment: states takes gummies occasionally   Sexual activity: Yes  Other Topics Concern   Not on file  Social History Narrative   Lives with his uncle    Social Drivers of Corporate investment banker Strain: Not on file  Food Insecurity: No Food Insecurity (11/20/2023)   Hunger Vital Sign    Worried About Running Out of Food in the Last Year: Never true    Ran Out of Food in the Last Year: Never true  Transportation Needs: No Transportation Needs (11/20/2023)   PRAPARE - Administrator, Civil Service (Medical): No    Lack of Transportation (Non-Medical): No  Physical Activity: Not on file  Stress: Not on file  Social Connections: Unknown (08/05/2022)   Received from [facility]   Social Network    Social Network: Not on file  Intimate Partner Violence: Not At Risk (11/20/2023)   Humiliation, Afraid, Rape, and Kick questionnaire    Fear of Current or Ex-Partner: No    Emotionally Abused: No    Physically Abused: No    Sexually Abused: No    Family History  Problem Relation Age of Onset   Coronary artery disease Mother    Arrhythmia Mother        Pacemaker placed in 2021   Heart failure  Mother    Breast cancer Mother    Coronary artery disease Father    Colon cancer Father    Colon polyps Father     Anti-infectives: Anti-infectives (From admission, onward)    Start     Dose/Rate Route Frequency Ordered Stop   11/20/23 2315  ceFAZolin  (ANCEF ) IVPB 2g/100 mL premix  2 g 200 mL/hr over 30 Minutes Intravenous Every 8 hours 11/20/23 2225 11/27/23 2159       Current Facility-Administered Medications  Medication Dose Route Frequency Provider Last Rate Last Admin   0.9 %  sodium chloride  infusion  250 mL Intravenous PRN Arida, Muhammad A, MD       acetaminophen  (TYLENOL ) tablet 650 mg  650 mg Oral Q4H PRN Darron Deatrice LABOR, MD   650 mg at 11/21/23 1059   albuterol  (PROVENTIL ) (2.5 MG/3ML) 0.083% nebulizer solution 2.5 mg  2.5 mg Inhalation Q6H PRN Darron Deatrice LABOR, MD       aspirin  EC tablet 81 mg  81 mg Oral Daily Arida, Muhammad A, MD   81 mg at 11/21/23 1047   budesonide -glycopyrrolate -formoterol  (BREZTRI ) 160-9-4.8 MCG/ACT inhaler 2 puff  2 puff Inhalation BID Arida, Muhammad A, MD   2 puff at 11/21/23 1046   ceFAZolin  (ANCEF ) IVPB 2g/100 mL premix  2 g Intravenous Q8H Kathrene Almarie Bake, NP 200 mL/hr at 11/21/23 1422 2 g at 11/21/23 1422   Chlorhexidine  Gluconate Cloth 2 % PADS 6 each  6 each Topical Daily Darron Deatrice A, MD       clopidogrel  (PLAVIX ) tablet 75 mg  75 mg Oral Daily Arida, Muhammad A, MD   75 mg at 11/21/23 1047   enoxaparin  (LOVENOX ) injection 40 mg  40 mg Subcutaneous Q24H Arida, Muhammad A, MD   40 mg at 11/21/23 1046   ezetimibe  (ZETIA ) tablet 10 mg  10 mg Oral Daily Arida, Muhammad A, MD   10 mg at 11/21/23 1100   famotidine  (PEPCID ) tablet 20 mg  20 mg Oral QHS PRN Darron Deatrice LABOR, MD   20 mg at 11/20/23 1816   fenofibrate  tablet 54 mg  54 mg Oral Daily Arida, Muhammad A, MD   54 mg at 11/21/23 1046   free water  500 mL  500 mL Oral Once Arida, Muhammad A, MD       furosemide  (LASIX ) tablet 40 mg  40 mg Oral Daily Arida, Muhammad  A, MD   40 mg at 11/21/23 1046   isosorbide  mononitrate (IMDUR ) 24 hr tablet 60 mg  60 mg Oral Daily Arida, Muhammad A, MD   60 mg at 11/21/23 1046   losartan  (COZAAR ) tablet 25 mg  25 mg Oral Daily Arida, Muhammad A, MD   25 mg at 11/21/23 1046   metoprolol  succinate (TOPROL -XL) 24 hr tablet 25 mg  25 mg Oral BID Arida, Muhammad A, MD   25 mg at 11/21/23 1047   nicotine  (NICODERM CQ  - dosed in mg/24 hours) patch 14 mg  14 mg Transdermal Daily Melvin, Alexander B, MD   14 mg at 11/21/23 1419   nitroGLYCERIN  50 mg in dextrose  5 % 250 mL (0.2 mg/mL) infusion  0-200 mcg/min Intravenous Titrated Darron Deatrice LABOR, MD   Stopped at 11/21/23 0701   ondansetron  (ZOFRAN ) injection 4 mg  4 mg Intravenous Q6H PRN Darron Deatrice LABOR, MD       pantoprazole  (PROTONIX ) EC tablet 40 mg  40 mg Oral Daily Arida, Muhammad A, MD   40 mg at 11/21/23 1420   rosuvastatin  (CRESTOR ) tablet 40 mg  40 mg Oral Daily Arida, Muhammad A, MD   40 mg at 11/21/23 1046   sodium chloride  flush (NS) 0.9 % injection 3 mL  3 mL Intravenous Q12H Darron Deatrice A, MD   3 mL at 11/20/23 2200   sodium chloride  flush (NS) 0.9 % injection 3 mL  3 mL Intravenous PRN Darron Deatrice LABOR, MD       sodium chloride  flush (NS) 0.9 % injection 3 mL  3 mL Intravenous Q12H Darron Deatrice A, MD   3 mL at 11/21/23 1100   sodium chloride  flush (NS) 0.9 % injection 3 mL  3 mL Intravenous PRN Arida, Muhammad A, MD       spironolactone  (ALDACTONE ) tablet 25 mg  25 mg Oral Daily Arida, Muhammad A, MD   25 mg at 11/21/23 1100     Objective: Vital signs in last 24 hours: BP 122/81   Pulse 84   Temp 98.2 F (36.8 C) (Oral)   Resp 20   Ht 6' (1.829 m)   Wt 109.4 kg   SpO2 96%   BMI 32.71 kg/m   Intake/Output from previous day: 09/26 0701 - 09/27 0700 In: 1726.1 [P.O.:740; I.V.:786.1; IV Piggyback:200.1] Out: 2125 [Urine:2125] Intake/Output this shift: Total I/O In: 480.1 [P.O.:480; I.V.:0.1] Out: 1225 [Urine:1225]   Physical  Exam Constitutional:      Appearance: Normal appearance.  Abdominal:     General: Abdomen is flat.     Palpations: Abdomen is soft.     Comments: There is some swelling and induration in the right inguinal area without associated erythema, crepitus, fluctuance or superficial necrosis.  The is mild tenderness.   Genitourinary:    Comments: There is no erythema, swelling or tenderness of the penis or scrotum.  Neurological:     Mental Status: He is alert.     Lab Results:  Results for orders placed or performed during the hospital encounter of 11/20/23 (from the past 24 hours)  MRSA Next Gen by PCR, Nasal     Status: None   Collection Time: 11/20/23  4:37 PM   Specimen: Nasal Mucosa; Nasal Swab  Result Value Ref Range   MRSA by PCR Next Gen NOT DETECTED NOT DETECTED  CBC     Status: Abnormal   Collection Time: 11/20/23  4:52 PM  Result Value Ref Range   WBC 12.0 (H) 4.0 - 10.5 K/uL   RBC 5.27 4.22 - 5.81 MIL/uL   Hemoglobin 14.6 13.0 - 17.0 g/dL   HCT 56.6 60.9 - 47.9 %   MCV 82.2 80.0 - 100.0 fL   MCH 27.7 26.0 - 34.0 pg   MCHC 33.7 30.0 - 36.0 g/dL   RDW 84.3 (H) 88.4 - 84.4 %   Platelets 274 150 - 400 K/uL   nRBC 0.0 0.0 - 0.2 %  Creatinine, serum     Status: Abnormal   Collection Time: 11/20/23  4:52 PM  Result Value Ref Range   Creatinine, Ser 1.27 (H) 0.61 - 1.24 mg/dL   GFR, Estimated >39 >39 mL/min  Urine Drug Screen, Qualitative ([facility] only)     Status: Abnormal   Collection Time: 11/20/23  7:21 PM  Result Value Ref Range   Tricyclic, Ur Screen NONE DETECTED NONE DETECTED   Amphetamines, Ur Screen NONE DETECTED NONE DETECTED   MDMA (Ecstasy)Ur Screen NONE DETECTED NONE DETECTED   Cocaine Metabolite,Ur [place] NONE DETECTED NONE DETECTED   Opiate, Ur Screen NONE DETECTED NONE DETECTED   Phencyclidine (PCP) Ur S NONE DETECTED NONE DETECTED   Cannabinoid 50 Ng, Ur [place] NONE DETECTED NONE DETECTED   Barbiturates, Ur Screen NONE DETECTED NONE DETECTED   Benzodiazepine, Ur  Scrn POSITIVE (A) NONE DETECTED   Methadone Scn, Ur NONE DETECTED NONE DETECTED  Culture, blood (Routine X 2) w Reflex to ID Panel  Status: None (Preliminary result)   Collection Time: 11/20/23 10:39 PM   Specimen: BLOOD RIGHT HAND  Result Value Ref Range   Specimen Description BLOOD RIGHT HAND    Special Requests      BOTTLES DRAWN AEROBIC ONLY Blood Culture results may not be optimal due to an inadequate volume of blood received in culture bottles   Culture      NO GROWTH < 12 HOURS Performed at [facility], [address] [place], [place] [address]    Report Status PENDING   Culture, blood (Routine X 2) w Reflex to ID Panel     Status: None (Preliminary result)   Collection Time: 11/20/23 10:41 PM   Specimen: BLOOD LEFT HAND  Result Value Ref Range   Specimen Description BLOOD LEFT HAND    Special Requests      BOTTLES DRAWN AEROBIC ONLY Blood Culture results may not be optimal due to an inadequate volume of blood received in culture bottles   Culture      NO GROWTH < 12 HOURS Performed at [facility], [address] [place], [place] [address]    Report Status PENDING   Basic metabolic panel     Status: Abnormal   Collection Time: 11/21/23  5:58 AM  Result Value Ref Range   Sodium 137 135 - 145 mmol/L   Potassium 3.8 3.5 - 5.1 mmol/L   Chloride 106 98 - 111 mmol/L   CO2 20 (L) 22 - 32 mmol/L   Glucose, Bld 97 70 - 99 mg/dL   BUN 13 6 - 20 mg/dL   Creatinine, Ser 8.78 0.61 - 1.24 mg/dL   Calcium  8.3 (L) 8.9 - 10.3 mg/dL   GFR, Estimated >39 >39 mL/min   Anion gap 11 5 - 15  CBC     Status: Abnormal   Collection Time: 11/21/23  5:58 AM  Result Value Ref Range   WBC 12.3 (H) 4.0 - 10.5 K/uL   RBC 5.22 4.22 - 5.81 MIL/uL   Hemoglobin 14.4 13.0 - 17.0 g/dL   HCT 57.5 60.9 - 47.9 %   MCV 81.2 80.0 - 100.0 fL   MCH 27.6 26.0 - 34.0 pg   MCHC 34.0 30.0 - 36.0 g/dL   RDW 84.2 (H) 88.4 - 84.4 %   Platelets 296 150 - 400 K/uL   nRBC 0.0 0.0 - 0.2  %  Lactic acid, plasma     Status: None   Collection Time: 11/21/23  5:58 AM  Result Value Ref Range   Lactic Acid, Venous 1.2 0.5 - 1.9 mmol/L  Procalcitonin     Status: None   Collection Time: 11/21/23  5:58 AM  Result Value Ref Range   Procalcitonin <0.10 ng/mL    BMET Recent Labs    11/18/23 1548 11/20/23 1652 11/21/23 0558  NA 140  --  137  K 4.3  --  3.8  CL 101  --  106  CO2 23  --  20*  GLUCOSE 76  --  97  BUN 9  --  13  CREATININE 1.57* 1.27* 1.21  CALCIUM  10.0  --  8.3*   PT/INR No results for input(s): LABPROT, INR in the last 72 hours. ABG No results for input(s): PHART, HCO3 in the last 72 hours.  Invalid input(s): PCO2, PO2  Studies/Results: CT ABDOMEN PELVIS W CONTRAST Result Date: 11/20/2023 CLINICAL DATA:  Right lower quadrant abdominal pain. Right groin abscess. EXAM: CT ABDOMEN AND PELVIS WITH CONTRAST TECHNIQUE: Multidetector  CT imaging of the abdomen and pelvis was performed using the standard protocol following bolus administration of intravenous contrast. RADIATION DOSE REDUCTION: This exam was performed according to the departmental dose-optimization program which includes automated exposure control, adjustment of the mA and/or kV according to patient size and/or use of iterative reconstruction technique. CONTRAST:  OMNIPAQUE  IOHEXOL  300 MG/ML  SOLN COMPARISON:  06/05/2023 FINDINGS: Lower chest: Atelectasis in the lung bases. Hepatobiliary: No focal liver abnormality is seen. No gallstones, gallbladder wall thickening, or biliary dilatation. Pancreas: Unremarkable. No pancreatic ductal dilatation or surrounding inflammatory changes. Spleen: Normal in size without focal abnormality. Adrenals/Urinary Tract: Adrenal glands are unremarkable. Kidneys are normal, without renal calculi, focal lesion, or hydronephrosis. Bladder is unremarkable. Stomach/Bowel: Stomach, small bowel, and colon are not abnormally distended. No wall thickening or  inflammatory changes. Appendix is normal. Vascular/Lymphatic: Aortic atherosclerosis. No enlarged abdominal or pelvic lymph nodes. Reproductive: Prostate is unremarkable. Other: Soft tissue infiltration in the right groin region extending medially to the base of the penis. Mild skin thickening. This is likely to represent cellulitis. No loculated collection to suggest any abscesses. Right groin lymph nodes are not pathologically enlarged. No soft tissue gas. No radiopaque soft tissue foreign body. Musculoskeletal: No acute bony abnormalities. IMPRESSION: 1. Infiltration the subcutaneous fat of the right groin extending to the base of the penis medially. This is likely cellulitis. No loculated collections. 2. No evidence of bowel obstruction or inflammation. Appendix is normal. Electronically Signed   By: Elsie Gravely M.D.   On: 11/20/2023 21:52   CARDIAC CATHETERIZATION Result Date: 11/20/2023   Prox RCA-1 lesion is 70% stenosed.   Prox RCA-2 lesion is 70% stenosed.   Mid RCA-1 lesion is 70% stenosed.   Dist RCA lesion is 40% stenosed.   Mid LAD-1 lesion is 80% stenosed.   Mid LAD-2 lesion is 70% stenosed.   Dist LAD-1 lesion is 99% stenosed.   Origin to Insertion lesion is 100% stenosed.   RPDA lesion is 60% stenosed.   RV Branch lesion is 60% stenosed.   Mid RCA-2 lesion is 100% stenosed.   2nd Diag lesion is 95% stenosed.   Ost LM lesion is 40% stenosed.   Dist LAD-3 lesion is 60% stenosed.   Dist LAD-2 lesion is 70% stenosed.   Prox LAD to Mid LAD lesion is 70% stenosed.   A drug-eluting stent was successfully placed using a STENT ONYX FRONTIER 2.5X30.   A drug-eluting stent was successfully placed using a STENT ONYX FRONTIER 2.25X26.   Balloon angioplasty was performed using a BALLOON [place] TREK NEO RX 3.0X20.   Balloon angioplasty was performed using a BALLOON [place] TREK NEO RX 3.0X20.   Post intervention, there is a 0% residual stenosis.   Post intervention, there is a 0% residual stenosis.   Post  intervention, there is a 0% residual stenosis.   Post intervention, there is a 0% residual stenosis.   Post intervention, there is a 0% residual stenosis.   SVG and is normal in caliber.   SVG.   LIMA and is normal in caliber.   The graft exhibits no disease.   The graft exhibits no disease.   In the absence of any other complications or medical issues, we expect the patient to be ready for discharge from an interventional cardiology perspective on 11/21/2023.   Recommend dual antiplatelet therapy with Aspirin  81mg  daily and Clopidogrel  75mg  daily. 1.  Severe underlying three-vessel coronary artery disease with patent LIMA to diagonal and patent SVG  to right PDA.  The SVG to LAD is chronically occluded.  Native LAD has extensive stenting from the proximal to the distal segment with severe in-stent restenosis extending distal to the stent.  The LAD was previously getting flow from the SVG to diagonal but that is no longer the case due to progression of ostial diagonal disease. 2.  Left ventricular angiography was not performed due to chronic kidney disease.  Normal left ventricular end-diastolic pressure. 3. Successful complex angioplasty and 2 overlapped drug-eluting stent placement to the distal LAD.  OCT was used but was not diagnostic due to poor distal flow.  Balloon angioplasty for in-stent restenosis was done to the mid and proximal stent.  Extensive coronary spasm noted with every stent placement that improved with intracoronary nitroglycerin . Recommendations: I added nitroglycerin  drip at the end of the case.  Not entirely sure why the patient is having such extensive coronary spasm.  I increased the dose of Imdur .  Will check urine drug screen. The LAD stents are high risk for target vessel failure in the future due to extensive stenting.  Unfortunately, the vein graft to the LAD has been occluded and no options for further bypass given very diffuse distal disease.   I have discussed his case with Dr.  Seena and reviewed the pertinent notes, labs and imaging as well as Dr. Marval prior notes.   Assessment/Plan: Right inguinal cellulitis with possible evolving abscess.   Since he is s/p stenting and is on lovenox  and plavix , I would be reluctant to proceed with I&D unless he doesn't respond to antibiotics.  He is currently on ancef .   It might be worthwhile to discuss antibiotic selection with ID since he hasn't had improvement in the lesion since yesterday per his report.         No follow-ups on file.    CC: Dr. Marsa Seena.      Vikkie Goeden 11/21/2023

## 2023-11-21 NOTE — Progress Notes (Signed)
 Pharmacy Antibiotic Note  Logan Crawford is a 48 y.o. male admitted on 11/20/2023 with cellulitis and ruling out MRSA.  Pharmacy has been consulted for Vancomycin  dosing.  Plan: Vancomycin  2000 mg IV Q 24 hrs. Goal AUC 400-550. Expected AUC: 510.1 SCr used: 1.21 Expected Cmin: 9.2   Height: 6' (182.9 cm) Weight: 109.4 kg (241 lb 3.2 oz) IBW/kg (Calculated) : 77.6  Temp (24hrs), Avg:98.3 F (36.8 C), Min:97.8 F (36.6 C), Max:99.2 F (37.3 C)  Recent Labs  Lab 11/18/23 1548 11/20/23 1652 11/21/23 0558  WBC 10.4 12.0* 12.3*  CREATININE 1.57* 1.27* 1.21  LATICACIDVEN  --   --  1.2    Estimated Creatinine Clearance: 94.3 mL/min (by C-G formula based on SCr of 1.21 mg/dL).    Allergies  Allergen Reactions   Bee Pollen     Patient states he is not allergic to bees but has seasonal allergies to pollen    Antimicrobials this admission: Cefazolin  9/27 >>  Vancomycin  9/27 >>   Dose adjustments this admission:  Microbiology results:   Thank you for allowing pharmacy to be a part of this patient's care.  Olam Fritter, PharmD, BCPS 11/21/2023 4:38 PM

## 2023-11-22 ENCOUNTER — Observation Stay

## 2023-11-22 DIAGNOSIS — I200 Unstable angina: Secondary | ICD-10-CM | POA: Diagnosis not present

## 2023-11-22 DIAGNOSIS — I2510 Atherosclerotic heart disease of native coronary artery without angina pectoris: Secondary | ICD-10-CM | POA: Diagnosis not present

## 2023-11-22 DIAGNOSIS — L03314 Cellulitis of groin: Secondary | ICD-10-CM | POA: Diagnosis not present

## 2023-11-22 LAB — CBC
HCT: 41.6 % (ref 39.0–52.0)
Hemoglobin: 13.9 g/dL (ref 13.0–17.0)
MCH: 27.6 pg (ref 26.0–34.0)
MCHC: 33.4 g/dL (ref 30.0–36.0)
MCV: 82.7 fL (ref 80.0–100.0)
Platelets: 302 K/uL (ref 150–400)
RBC: 5.03 MIL/uL (ref 4.22–5.81)
RDW: 16.1 % — ABNORMAL HIGH (ref 11.5–15.5)
WBC: 10.9 K/uL — ABNORMAL HIGH (ref 4.0–10.5)
nRBC: 0 % (ref 0.0–0.2)

## 2023-11-22 MED ORDER — OXYCODONE HCL 5 MG PO TABS
5.0000 mg | ORAL_TABLET | ORAL | Status: AC | PRN
  Administered 2023-11-22: 5 mg via ORAL
  Filled 2023-11-22: qty 1

## 2023-11-22 MED ORDER — OXYCODONE HCL 5 MG PO TABS
5.0000 mg | ORAL_TABLET | ORAL | Status: AC | PRN
  Administered 2023-11-22 – 2023-11-23 (×2): 5 mg via ORAL
  Filled 2023-11-22 (×2): qty 1

## 2023-11-22 MED ORDER — ISOSORBIDE MONONITRATE ER 30 MG PO TB24
30.0000 mg | ORAL_TABLET | Freq: Every day | ORAL | Status: DC
  Administered 2023-11-23 – 2023-11-25 (×2): 30 mg via ORAL
  Filled 2023-11-22 (×2): qty 1

## 2023-11-22 MED ORDER — METOPROLOL SUCCINATE ER 25 MG PO TB24
25.0000 mg | ORAL_TABLET | Freq: Every day | ORAL | Status: DC
  Administered 2023-11-23 – 2023-11-25 (×2): 25 mg via ORAL
  Filled 2023-11-22 (×2): qty 1

## 2023-11-22 NOTE — Plan of Care (Signed)
  Problem: Education: Goal: Understanding of CV disease, CV risk reduction, and recovery process will improve Outcome: Progressing Goal: Individualized Educational Video(s) Outcome: Progressing   Problem: Activity: Goal: Ability to return to baseline activity level will improve Outcome: Progressing   Problem: Cardiovascular: Goal: Ability to achieve and maintain adequate cardiovascular perfusion will improve Outcome: Progressing Goal: Vascular access site(s) Level 0-1 will be maintained Outcome: Progressing   Problem: Health Behavior/Discharge Planning: Goal: Ability to safely manage health-related needs after discharge will improve Outcome: Progressing   Problem: Education: Goal: Knowledge of General Education information will improve Description: Including pain rating scale, medication(s)/side effects and non-pharmacologic comfort measures Outcome: Progressing   Problem: Health Behavior/Discharge Planning: Goal: Ability to manage health-related needs will improve Outcome: Progressing   Problem: Clinical Measurements: Goal: Ability to maintain clinical measurements within normal limits will improve Outcome: Progressing Goal: Will remain free from infection Outcome: Progressing Goal: Diagnostic test results will improve Outcome: Progressing Goal: Respiratory complications will improve Outcome: Progressing Goal: Cardiovascular complication will be avoided Outcome: Progressing   Problem: Activity: Goal: Risk for activity intolerance will decrease Outcome: Progressing   Problem: Nutrition: Goal: Adequate nutrition will be maintained Outcome: Progressing   Problem: Coping: Goal: Level of anxiety will decrease Outcome: Progressing   Problem: Elimination: Goal: Will not experience complications related to bowel motility Outcome: Progressing Goal: Will not experience complications related to urinary retention Outcome: Progressing   Problem: Pain Managment: Goal:  General experience of comfort will improve and/or be controlled Outcome: Progressing   Problem: Safety: Goal: Ability to remain free from injury will improve Outcome: Progressing   Problem: Skin Integrity: Goal: Risk for impaired skin integrity will decrease Outcome: Progressing   Problem: Clinical Measurements: Goal: Ability to avoid or minimize complications of infection will improve Outcome: Progressing   Problem: Skin Integrity: Goal: Skin integrity will improve Outcome: Progressing

## 2023-11-22 NOTE — Progress Notes (Signed)
 2 Days Post-Op  Subjective: Logan Crawford reports persistent tenderness and swelling in the right groin.  He has no associated signs or symptoms.   ROS:  Review of Systems  Constitutional:  Negative for chills and fever.    Anti-infectives: Anti-infectives (From admission, onward)    Start     Dose/Rate Route Frequency Ordered Stop   11/22/23 1800  vancomycin  (VANCOREADY) IVPB 2000 mg/400 mL        2,000 mg 200 mL/hr over 120 Minutes Intravenous Every 24 hours 11/21/23 1634     11/21/23 2200  cefTRIAXone (ROCEPHIN) 1 g in sodium chloride  0.9 % 100 mL IVPB        1 g 200 mL/hr over 30 Minutes Intravenous Every 24 hours 11/21/23 1818     11/21/23 1730  vancomycin  (VANCOREADY) IVPB 2000 mg/400 mL        2,000 mg 200 mL/hr over 120 Minutes Intravenous  Once 11/21/23 1632 11/21/23 1916   11/20/23 2315  ceFAZolin  (ANCEF ) IVPB 2g/100 mL premix  Status:  Discontinued        2 g 200 mL/hr over 30 Minutes Intravenous Every 8 hours 11/20/23 2225 11/21/23 1818       Current Facility-Administered Medications  Medication Dose Route Frequency Provider Last Rate Last Admin   acetaminophen  (TYLENOL ) tablet 650 mg  650 mg Oral Q4H PRN Logan Grass A, MD   650 mg at 11/21/23 1059   albuterol  (PROVENTIL ) (2.5 MG/3ML) 0.083% nebulizer solution 2.5 mg  2.5 mg Inhalation Q6H PRN Arida, Muhammad A, MD       aspirin  EC tablet 81 mg  81 mg Oral Daily Arida, Muhammad A, MD   81 mg at 11/22/23 0919   budesonide -glycopyrrolate -formoterol  (BREZTRI ) 160-9-4.8 MCG/ACT inhaler 2 puff  2 puff Inhalation BID Arida, Muhammad A, MD   2 puff at 11/22/23 0921   cefTRIAXone (ROCEPHIN) 1 g in sodium chloride  0.9 % 100 mL IVPB  1 g Intravenous Q24H Melvin, Crawford B, MD   Stopped at 11/21/23 2201   Chlorhexidine  Gluconate Cloth 2 % PADS 6 each  6 each Topical Daily Arida, Muhammad A, MD       clopidogrel  (PLAVIX ) tablet 75 mg  75 mg Oral Daily Arida, Muhammad A, MD   75 mg at 11/22/23 0919   enoxaparin  (LOVENOX )  injection 40 mg  40 mg Subcutaneous Q24H Arida, Muhammad A, MD   40 mg at 11/22/23 0919   ezetimibe  (ZETIA ) tablet 10 mg  10 mg Oral Daily Arida, Muhammad A, MD   10 mg at 11/22/23 9051   famotidine  (PEPCID ) tablet 20 mg  20 mg Oral QHS PRN Logan Grass LABOR, MD   20 mg at 11/20/23 1816   fenofibrate  tablet 54 mg  54 mg Oral Daily Arida, Muhammad A, MD   54 mg at 11/22/23 0947   free water  500 mL  500 mL Oral Once Arida, Muhammad A, MD       furosemide  (LASIX ) tablet 40 mg  40 mg Oral Daily Arida, Muhammad A, MD   40 mg at 11/21/23 1046   isosorbide  mononitrate (IMDUR ) 24 hr tablet 60 mg  60 mg Oral Daily Arida, Muhammad A, MD   60 mg at 11/21/23 1046   losartan  (COZAAR ) tablet 25 mg  25 mg Oral Daily Arida, Muhammad A, MD   25 mg at 11/21/23 1046   metoprolol  succinate (TOPROL -XL) 24 hr tablet 25 mg  25 mg Oral BID Arida, Muhammad A, MD   25 mg at 11/21/23  2131   nicotine  (NICODERM CQ  - dosed in mg/24 hours) patch 14 mg  14 mg Transdermal Daily Melvin, Crawford B, MD   14 mg at 11/22/23 0919   nitroGLYCERIN  50 mg in dextrose  5 % 250 mL (0.2 mg/mL) infusion  0-200 mcg/min Intravenous Titrated Logan Deatrice LABOR, MD   Stopped at 11/21/23 0701   ondansetron  (ZOFRAN ) injection 4 mg  4 mg Intravenous Q6H PRN Logan Deatrice LABOR, MD       pantoprazole  (PROTONIX ) EC tablet 40 mg  40 mg Oral Daily Arida, Muhammad A, MD   40 mg at 11/22/23 0919   rosuvastatin  (CRESTOR ) tablet 40 mg  40 mg Oral Daily Arida, Muhammad A, MD   40 mg at 11/22/23 0919   sodium chloride  flush (NS) 0.9 % injection 3 mL  3 mL Intravenous Q12H Logan Deatrice A, MD   3 mL at 11/22/23 0919   sodium chloride  flush (NS) 0.9 % injection 3 mL  3 mL Intravenous PRN Logan Deatrice LABOR, MD       sodium chloride  flush (NS) 0.9 % injection 3 mL  3 mL Intravenous Q12H Logan Deatrice A, MD   3 mL at 11/22/23 0919   sodium chloride  flush (NS) 0.9 % injection 3 mL  3 mL Intravenous PRN Logan Deatrice LABOR, MD       spironolactone  (ALDACTONE ) tablet  25 mg  25 mg Oral Daily Arida, Muhammad A, MD   25 mg at 11/21/23 1100   vancomycin  (VANCOREADY) IVPB 2000 mg/400 mL  2,000 mg Intravenous Q24H Logan Marsa NOVAK, MD         Objective: Vital signs in last 24 hours: Temp:  [97.1 F (36.2 C)-98.1 F (36.7 C)] 97.7 F (36.5 C) (09/28 0758) Pulse Rate:  [68-89] 68 (09/28 0758) Resp:  [14-21] 17 (09/28 0758) BP: (94-134)/(58-90) 110/70 (09/28 0956) SpO2:  [92 %-99 %] 96 % (09/28 0758) FiO2 (%):  [21 %] 21 % (09/28 0000)  Intake/Output from previous day: 09/27 0701 - 09/28 0700 In: 1060.1 [P.O.:960; I.V.:0.1; IV Piggyback:100] Out: 1925 [Urine:1925] Intake/Output this shift: No intake/output data recorded.   Physical Exam Vitals reviewed.  Constitutional:      Appearance: Normal appearance.  Skin:    Comments: There is more distinct erythema and tenderness in the right inguinal area concerning for Crawford subcutaneous abscess but it remains confined and is well away from the genitalia.   Neurological:     Mental Status: He is alert.     Lab Results:  Recent Labs    11/20/23 1652 11/21/23 0558  WBC 12.0* 12.3*  HGB 14.6 14.4  HCT 43.3 42.4  PLT 274 296   BMET Recent Labs    11/20/23 1652 11/21/23 0558  NA  --  137  K  --  3.8  CL  --  106  CO2  --  20*  GLUCOSE  --  97  BUN  --  13  CREATININE 1.27* 1.21  CALCIUM   --  8.3*   PT/INR No results for input(s): LABPROT, INR in the last 72 hours. ABG No results for input(s): PHART, HCO3 in the last 72 hours.  Invalid input(s): PCO2, PO2  Studies/Results: CT ABDOMEN PELVIS W CONTRAST Result Date: 11/20/2023 CLINICAL DATA:  Right lower quadrant abdominal pain. Right groin abscess. EXAM: CT ABDOMEN AND PELVIS WITH CONTRAST TECHNIQUE: Multidetector CT imaging of the abdomen and pelvis was performed using the standard protocol following bolus administration of intravenous contrast. RADIATION DOSE REDUCTION: This exam was  performed according to the  departmental dose-optimization program which includes automated exposure control, adjustment of the mA and/or kV according to patient size and/or use of iterative reconstruction technique. CONTRAST:  OMNIPAQUE  IOHEXOL  300 MG/ML  SOLN COMPARISON:  06/05/2023 FINDINGS: Lower chest: Atelectasis in the lung bases. Hepatobiliary: No focal liver abnormality is seen. No gallstones, gallbladder wall thickening, or biliary dilatation. Pancreas: Unremarkable. No pancreatic ductal dilatation or surrounding inflammatory changes. Spleen: Normal in size without focal abnormality. Adrenals/Urinary Tract: Adrenal glands are unremarkable. Kidneys are normal, without renal calculi, focal lesion, or hydronephrosis. Bladder is unremarkable. Stomach/Bowel: Stomach, small bowel, and colon are not abnormally distended. No wall thickening or inflammatory changes. Appendix is normal. Vascular/Lymphatic: Aortic atherosclerosis. No enlarged abdominal or pelvic lymph nodes. Reproductive: Prostate is unremarkable. Other: Soft tissue infiltration in the right groin region extending medially to the base of the penis. Mild skin thickening. This is likely to represent cellulitis. No loculated collection to suggest any abscesses. Right groin lymph nodes are not pathologically enlarged. No soft tissue gas. No radiopaque soft tissue foreign body. Musculoskeletal: No acute bony abnormalities. IMPRESSION: 1. Infiltration the subcutaneous fat of the right groin extending to the base of the penis medially. This is likely cellulitis. No loculated collections. 2. No evidence of bowel obstruction or inflammation. Appendix is normal. Electronically Signed   By: Elsie Gravely M.D.   On: 11/20/2023 21:52   CARDIAC CATHETERIZATION Result Date: 11/20/2023   Prox RCA-1 lesion is 70% stenosed.   Prox RCA-2 lesion is 70% stenosed.   Mid RCA-1 lesion is 70% stenosed.   Dist RCA lesion is 40% stenosed.   Mid LAD-1 lesion is 80% stenosed.   Mid LAD-2 lesion  is 70% stenosed.   Dist LAD-1 lesion is 99% stenosed.   Origin to Insertion lesion is 100% stenosed.   RPDA lesion is 60% stenosed.   RV Branch lesion is 60% stenosed.   Mid RCA-2 lesion is 100% stenosed.   2nd Diag lesion is 95% stenosed.   Ost LM lesion is 40% stenosed.   Dist LAD-3 lesion is 60% stenosed.   Dist LAD-2 lesion is 70% stenosed.   Prox LAD to Mid LAD lesion is 70% stenosed.   Crawford drug-eluting stent was successfully placed using Crawford STENT ONYX FRONTIER 2.5X30.   Crawford drug-eluting stent was successfully placed using Crawford STENT ONYX FRONTIER 2.25X26.   Balloon angioplasty was performed using Crawford BALLOON [place] TREK NEO RX 3.0X20.   Balloon angioplasty was performed using Crawford BALLOON [place] TREK NEO RX 3.0X20.   Post intervention, there is Crawford 0% residual stenosis.   Post intervention, there is Crawford 0% residual stenosis.   Post intervention, there is Crawford 0% residual stenosis.   Post intervention, there is Crawford 0% residual stenosis.   Post intervention, there is Crawford 0% residual stenosis.   SVG and is normal in caliber.   SVG.   LIMA and is normal in caliber.   The graft exhibits no disease.   The graft exhibits no disease.   In the absence of any other complications or medical issues, we expect the patient to be ready for discharge from an interventional cardiology perspective on 11/21/2023.   Recommend dual antiplatelet therapy with Aspirin  81mg  daily and Clopidogrel  75mg  daily. 1.  Severe underlying three-vessel coronary artery disease with patent LIMA to diagonal and patent SVG to right PDA.  The SVG to LAD is chronically occluded.  Native LAD has extensive stenting from the proximal to the distal segment with  severe in-stent restenosis extending distal to the stent.  The LAD was previously getting flow from the SVG to diagonal but that is no longer the case due to progression of ostial diagonal disease. 2.  Left ventricular angiography was not performed due to chronic kidney disease.  Normal left ventricular end-diastolic pressure. 3.  Successful complex angioplasty and 2 overlapped drug-eluting stent placement to the distal LAD.  OCT was used but was not diagnostic due to poor distal flow.  Balloon angioplasty for in-stent restenosis was done to the mid and proximal stent.  Extensive coronary spasm noted with every stent placement that improved with intracoronary nitroglycerin . Recommendations: I added nitroglycerin  drip at the end of the case.  Not entirely sure why the patient is having such extensive coronary spasm.  I increased the dose of Imdur .  Will check urine drug screen. The LAD stents are high risk for target vessel failure in the future due to extensive stenting.  Unfortunately, the vein graft to the LAD has been occluded and no options for further bypass given very diffuse distal disease.     Assessment and Plan: Right inguinal phlegmon with probable abscess.  The inflammatory process is consolidating and is more suggestive of an abscess.  It is away from the genitalia and with the inguinal location, it would be worthwhile to have general surgery see for consideration of I&D.       LOS: 0 days    Norleen Seltzer 9/28/2025Patient ID: Logan Crawford, male   DOB: 20-May-1975, 48 y.o.   MRN: [id number]

## 2023-11-22 NOTE — Progress Notes (Signed)
 PROGRESS NOTE    [name]  FMW:[id number] DOB: May 02, 1975 DOA: 11/20/2023 PCP: Paseda, Folashade R, FNP  156A/156A-AA  LOS: 0 days   Brief hospital course:   Assessment & Plan: Logan Crawford is a 48 y.o. male with medical history significant of hypertension, hyperlipidemia, CKD 3A, GERD, CAD, CHF, obesity, OSA, substance use who presented for left heart cath and developed changes concerning for groin cellulitis.   Patient was admitted by cardiology yesterday due to unstable angina with worsening chest pain and fatigue in the setting of known advanced CAD status post percutaneous intervention and CABG.   Patient underwent successful catheterization with stent placement x 3 and angioplasty x 2-3.  Postoperatively and nurse noted a painful right groin mass and as patient was in the ICU this was discussed with the nurse practitioner.   Patient has history of this right groin mass that was last noted in the chart in 2021.  At that time he had had a painful intermittently draining lesion there for years.  Was treated for cellulitis with plan for follow-up for possible excision.  Patient reports that he was ultimately seen by urology and had this cyst excised from the base of his penis last year.   He reports a lesion today has developed over the past couple days and is in a different location than the previous lesion.  CT ordered by primary team and consultation with CCM NP overnight noted to show infiltration of subcutaneous fat of the right groin extending to the penis medially likely representing cellulitis.  No evidence of loculation to indicate abscess and no gas.   Patient started on Ancef  overnight and hospitalist team has been requested to take over as primary today for management of this patient's new issue.   Right groin cellulitis History of right groin cyst/lesion > Noted to have painful right groin mass overnight.  CT evaluation showed evidence of cellulitis without  evidence of loculation or gas.  Leukocytosis to 12.3.  Procalcitonin negative. > During previous evaluation it was noted that patient had had some mass with some drainage there for years.  Was treated for cellulitis in the past with plan for possible excision for this lesion/cyst/other. > Patient started on Ancef  and escalated to Vanc and ceftriaxone --urology consulted who rec GenSurg for I/D --US  groin today still neg for fluid collection, so will hold off formal GenSurg consult --d/c IV vanc --cont ceftriaxone  CAD Status post recent PCI History of CABG > Initially presented for left heart cath and possible intervention due to unstable angina in the setting of advanced CAD. > Underwent successful left heart cath yesterday with stent placement x 3 and angioplasty x 2-3. --cont ASA and plavix  - Continue rosuvastatin , Zetia , fenofibrate    Chronic diastolic CHF > Last echo was in 2024 for and showed EF 55-60%, G2 DD, normal RV function. 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   DVT prophylaxis: Lovenox  SQ Code Status: Full code  Family Communication:  Level of care: Telemetry Medical Dispo:   The patient is from: home Anticipated d/c is to: home Anticipated d/c date is:  tomorrow   Subjective and Interval History:  Pt reported pain in his right groin.  US  today showed no fluid collection.   Objective: Vitals:   11/22/23 0723 11/22/23 0758 11/22/23 0956 11/22/23 1630  BP:  (!) 94/58 110/70 110/62  Pulse:  68  72  Resp:  17  16  Temp:  97.7 F (36.5 C)  98 F (36.7 C)  TempSrc:      SpO2:  96%  98%  Weight: 109.4 kg      Height:        Intake/Output Summary (Last 24 hours) at 11/22/2023 1812 Last data filed at 11/22/2023 1300 Gross per 24 hour  Intake 940 ml  Output --  Net 940 ml   Filed Weights   11/20/23 1138 11/22/23 0723  Weight: 109.4 kg 109.4 kg    Examination:   Constitutional: NAD, AAOx3 HEENT: conjunctivae and lids normal, EOMI CV: No cyanosis.   RESP: normal respiratory effort, on RA Extremities: No effusions, edema in BLE SKIN: right groin with a firm area of erythema, no fluid underneath Neuro: II - XII grossly intact.   Psych: Normal mood and affect.  Appropriate judgement and reason   Data Reviewed: I have personally reviewed labs and imaging studies  Time spent: 50 minutes  Ellouise Haber, MD [facility] If 7PM-7AM, please contact night-coverage 11/22/2023, 6:12 PM

## 2023-11-22 NOTE — Progress Notes (Addendum)
 Rounding Note   Patient Name: Logan Crawford Date of Encounter: 11/22/2023  [facility] Cardiologist: Redell Cave, MD   Subjective No acute events overnight, started on antibiotic due to groin cellulitis/abscess.  Still has right groin pain redness.  Denies chest pain or shortness of breath.  Scheduled Meds:  aspirin  EC  81 mg Oral Daily   budesonide -glycopyrrolate -formoterol   2 puff Inhalation BID   Chlorhexidine  Gluconate Cloth  6 each Topical Daily   clopidogrel   75 mg Oral Daily   enoxaparin  (LOVENOX ) injection  40 mg Subcutaneous Q24H   ezetimibe   10 mg Oral Daily   fenofibrate   54 mg Oral Daily   free water   500 mL Oral Once   furosemide   40 mg Oral Daily   isosorbide  mononitrate  60 mg Oral Daily   losartan   25 mg Oral Daily   metoprolol  succinate  25 mg Oral BID   nicotine   14 mg Transdermal Daily   pantoprazole   40 mg Oral Daily   rosuvastatin   40 mg Oral Daily   sodium chloride  flush  3 mL Intravenous Q12H   sodium chloride  flush  3 mL Intravenous Q12H   spironolactone   25 mg Oral Daily   Continuous Infusions:  cefTRIAXone (ROCEPHIN)  IV Stopped (11/21/23 2201)   nitroGLYCERIN  Stopped (11/21/23 0701)   vancomycin      PRN Meds: acetaminophen , albuterol , famotidine , ondansetron  (ZOFRAN ) IV, sodium chloride  flush, sodium chloride  flush   Vital Signs  Vitals:   11/22/23 0511 11/22/23 0723 11/22/23 0758 11/22/23 0956  BP: 107/71  (!) 94/58 110/70  Pulse: 73  68   Resp: 18  17   Temp: 98.1 F (36.7 C)  97.7 F (36.5 C)   TempSrc:      SpO2: 96%  96%   Weight:  109.4 kg    Height:        Intake/Output Summary (Last 24 hours) at 11/22/2023 1405 Last data filed at 11/22/2023 0900 Gross per 24 hour  Intake 820 ml  Output 700 ml  Net 120 ml      11/22/2023    7:23 AM 11/20/2023   11:38 AM 11/18/2023    2:52 PM  Last 3 Weights  Weight (lbs) 241 lb 2.2 oz 241 lb 3.2 oz 242 lb  Weight (kg) 109.38 kg 109.408 kg 109.77 kg       Telemetry Sinus rhythm, heart rate 85- Personally Reviewed  ECG   - Personally Reviewed  Physical Exam  GEN: No acute distress.   Neck: No JVD Cardiac: RRR, no murmurs, rubs, or gallops.  Respiratory: Clear to auscultation bilaterally. GI: Soft, nontender, non-distended. Right groin erythema and tenderness MS: No edema; No deformity. Neuro:  Nonfocal  Psych: Normal affect   Labs High Sensitivity Troponin:  No results for input(s): TROPONINIHS in the last 720 hours.   Chemistry Recent Labs  Lab 11/18/23 1548 11/20/23 1652 11/21/23 0558  NA 140  --  137  K 4.3  --  3.8  CL 101  --  106  CO2 23  --  20*  GLUCOSE 76  --  97  BUN 9  --  13  CREATININE 1.57* 1.27* 1.21  CALCIUM  10.0  --  8.3*  GFRNONAA  --  >60 >60  ANIONGAP  --   --  11    Lipids No results for input(s): CHOL, TRIG, HDL, LABVLDL, LDLCALC, CHOLHDL in the last 168 hours.  Hematology Recent Labs  Lab 11/20/23 1652 11/21/23 0558 11/22/23 1055  WBC 12.0* 12.3* 10.9*  RBC 5.27 5.22 5.03  HGB 14.6 14.4 13.9  HCT 43.3 42.4 41.6  MCV 82.2 81.2 82.7  MCH 27.7 27.6 27.6  MCHC 33.7 34.0 33.4  RDW 15.6* 15.7* 16.1*  PLT 274 296 302   Thyroid No results for input(s): TSH, FREET4 in the last 168 hours.  BNPNo results for input(s): BNP, PROBNP in the last 168 hours.  DDimer No results for input(s): DDIMER in the last 168 hours.   Radiology  US  RT LOWER EXTREM LTD SOFT TISSUE NON VASCULAR Result Date: 11/22/2023 CLINICAL DATA:  [id number] Mass of right inguinal region [id number] EXAM: ULTRASOUND RIGHT LOWER EXTREMITY LIMITED TECHNIQUE: Ultrasound examination of the lower extremity soft tissues was performed in the area of clinical concern. COMPARISON:  November 20, 2023 FINDINGS: Targeted ultrasound was performed of the site of palpable concern in the RIGHT inguinal region. There is skin thickening with interdigitating subcutaneous edema noted in the RIGHT inguinal area. Visualized inguinal  lymph nodes are unremarkable in appearance with preserved echogenic hila and thin smooth cortices. No discrete focal drainable fluid collection is identified. IMPRESSION: 1. There is skin thickening with extensive interdigitating subcutaneous edema noted in the RIGHT inguinal area. This is nonspecific but could reflect cellulitis. No discrete focal drainable fluid collection is identified. Electronically Signed   By: Corean Salter M.D.   On: 11/22/2023 11:48   CT ABDOMEN PELVIS W CONTRAST Result Date: 11/20/2023 CLINICAL DATA:  Right lower quadrant abdominal pain. Right groin abscess. EXAM: CT ABDOMEN AND PELVIS WITH CONTRAST TECHNIQUE: Multidetector CT imaging of the abdomen and pelvis was performed using the standard protocol following bolus administration of intravenous contrast. RADIATION DOSE REDUCTION: This exam was performed according to the departmental dose-optimization program which includes automated exposure control, adjustment of the mA and/or kV according to patient size and/or use of iterative reconstruction technique. CONTRAST:  OMNIPAQUE  IOHEXOL  300 MG/ML  SOLN COMPARISON:  06/05/2023 FINDINGS: Lower chest: Atelectasis in the lung bases. Hepatobiliary: No focal liver abnormality is seen. No gallstones, gallbladder wall thickening, or biliary dilatation. Pancreas: Unremarkable. No pancreatic ductal dilatation or surrounding inflammatory changes. Spleen: Normal in size without focal abnormality. Adrenals/Urinary Tract: Adrenal glands are unremarkable. Kidneys are normal, without renal calculi, focal lesion, or hydronephrosis. Bladder is unremarkable. Stomach/Bowel: Stomach, small bowel, and colon are not abnormally distended. No wall thickening or inflammatory changes. Appendix is normal. Vascular/Lymphatic: Aortic atherosclerosis. No enlarged abdominal or pelvic lymph nodes. Reproductive: Prostate is unremarkable. Other: Soft tissue infiltration in the right groin region extending  medially to the base of the penis. Mild skin thickening. This is likely to represent cellulitis. No loculated collection to suggest any abscesses. Right groin lymph nodes are not pathologically enlarged. No soft tissue gas. No radiopaque soft tissue foreign body. Musculoskeletal: No acute bony abnormalities. IMPRESSION: 1. Infiltration the subcutaneous fat of the right groin extending to the base of the penis medially. This is likely cellulitis. No loculated collections. 2. No evidence of bowel obstruction or inflammation. Appendix is normal. Electronically Signed   By: Elsie Gravely M.D.   On: 11/20/2023 21:52    Cardiac Studies Echo 12/24 EF 55 to 60%  Patient Profile   48 y.o. male with history of CAD/CABG x 3 in 2023, DES mid LAD 2021, HFpEF, hypertension, hyperlipidemia, CKD 3 presenting for an elective cath with symptoms of chest pain.  Left heart cath showed significant native disease, SVG-LAD occluded, patent LIMA to D2, patent SVG to distal RCA.  Patient underwent DES  x 2 to LAD on 9/26.  Assessment & Plan   Chest pain, CAD/CABG, s/p DES x 2 to LAD on 9/26. - Currently denies chest pain after stent placement - Continue aspirin , Plavix  75, Zetia  10, Crestor  40. - Toprol -XL 25 mg daily.  2.  Hyperlipidemia - Crestor  40 mg daily, fenofibrate .  3.  Hypertension - BP controlled - Torsemide 25 mg daily, Aldactone  25, losartan  25.  4.  Possible groin infection/abscess - Started on antibiotics, may need I&D/surgical consult - Management as per hospitalist team    Signed, Redell Cave, MD  11/22/2023, 2:05 PM

## 2023-11-23 ENCOUNTER — Other Ambulatory Visit: Payer: Self-pay

## 2023-11-23 ENCOUNTER — Observation Stay
Admission: RE | Admit: 2023-11-23 | Discharge: 2023-11-23 | Disposition: A | Source: Home / Self Care | Attending: Cardiology | Admitting: Cardiovascular Disease

## 2023-11-23 ENCOUNTER — Encounter: Payer: Self-pay | Admitting: Cardiovascular Disease

## 2023-11-23 DIAGNOSIS — T82855A Stenosis of coronary artery stent, initial encounter: Secondary | ICD-10-CM | POA: Diagnosis present

## 2023-11-23 DIAGNOSIS — I25710 Atherosclerosis of autologous vein coronary artery bypass graft(s) with unstable angina pectoris: Secondary | ICD-10-CM | POA: Diagnosis present

## 2023-11-23 DIAGNOSIS — J438 Other emphysema: Secondary | ICD-10-CM | POA: Diagnosis present

## 2023-11-23 DIAGNOSIS — L03115 Cellulitis of right lower limb: Secondary | ICD-10-CM | POA: Diagnosis present

## 2023-11-23 DIAGNOSIS — Z7901 Long term (current) use of anticoagulants: Secondary | ICD-10-CM | POA: Diagnosis not present

## 2023-11-23 DIAGNOSIS — Z79899 Other long term (current) drug therapy: Secondary | ICD-10-CM | POA: Diagnosis not present

## 2023-11-23 DIAGNOSIS — Y831 Surgical operation with implant of artificial internal device as the cause of abnormal reaction of the patient, or of later complication, without mention of misadventure at the time of the procedure: Secondary | ICD-10-CM | POA: Diagnosis present

## 2023-11-23 DIAGNOSIS — R079 Chest pain, unspecified: Secondary | ICD-10-CM

## 2023-11-23 DIAGNOSIS — I5032 Chronic diastolic (congestive) heart failure: Secondary | ICD-10-CM | POA: Diagnosis present

## 2023-11-23 DIAGNOSIS — Z6833 Body mass index (BMI) 33.0-33.9, adult: Secondary | ICD-10-CM | POA: Diagnosis not present

## 2023-11-23 DIAGNOSIS — L03314 Cellulitis of groin: Secondary | ICD-10-CM | POA: Diagnosis present

## 2023-11-23 DIAGNOSIS — I358 Other nonrheumatic aortic valve disorders: Secondary | ICD-10-CM | POA: Diagnosis present

## 2023-11-23 DIAGNOSIS — E785 Hyperlipidemia, unspecified: Secondary | ICD-10-CM | POA: Diagnosis present

## 2023-11-23 DIAGNOSIS — Y828 Other medical devices associated with adverse incidents: Secondary | ICD-10-CM | POA: Diagnosis present

## 2023-11-23 DIAGNOSIS — I25110 Atherosclerotic heart disease of native coronary artery with unstable angina pectoris: Secondary | ICD-10-CM | POA: Diagnosis present

## 2023-11-23 DIAGNOSIS — G4733 Obstructive sleep apnea (adult) (pediatric): Secondary | ICD-10-CM | POA: Diagnosis present

## 2023-11-23 DIAGNOSIS — F1910 Other psychoactive substance abuse, uncomplicated: Secondary | ICD-10-CM | POA: Diagnosis present

## 2023-11-23 DIAGNOSIS — Z7984 Long term (current) use of oral hypoglycemic drugs: Secondary | ICD-10-CM | POA: Diagnosis not present

## 2023-11-23 DIAGNOSIS — F17210 Nicotine dependence, cigarettes, uncomplicated: Secondary | ICD-10-CM | POA: Diagnosis present

## 2023-11-23 DIAGNOSIS — I110 Hypertensive heart disease with heart failure: Secondary | ICD-10-CM | POA: Diagnosis present

## 2023-11-23 DIAGNOSIS — K219 Gastro-esophageal reflux disease without esophagitis: Secondary | ICD-10-CM | POA: Diagnosis present

## 2023-11-23 DIAGNOSIS — Z7982 Long term (current) use of aspirin: Secondary | ICD-10-CM | POA: Diagnosis not present

## 2023-11-23 DIAGNOSIS — Z7902 Long term (current) use of antithrombotics/antiplatelets: Secondary | ICD-10-CM | POA: Diagnosis not present

## 2023-11-23 DIAGNOSIS — I255 Ischemic cardiomyopathy: Secondary | ICD-10-CM | POA: Diagnosis present

## 2023-11-23 DIAGNOSIS — I700 Atherosclerosis of aorta: Secondary | ICD-10-CM | POA: Diagnosis present

## 2023-11-23 DIAGNOSIS — I200 Unstable angina: Secondary | ICD-10-CM | POA: Diagnosis present

## 2023-11-23 DIAGNOSIS — L02214 Cutaneous abscess of groin: Secondary | ICD-10-CM | POA: Diagnosis present

## 2023-11-23 DIAGNOSIS — Z8616 Personal history of COVID-19: Secondary | ICD-10-CM | POA: Diagnosis not present

## 2023-11-23 LAB — CBC
HCT: 43.5 % (ref 39.0–52.0)
Hemoglobin: 14.6 g/dL (ref 13.0–17.0)
MCH: 27.4 pg (ref 26.0–34.0)
MCHC: 33.6 g/dL (ref 30.0–36.0)
MCV: 81.8 fL (ref 80.0–100.0)
Platelets: 328 K/uL (ref 150–400)
RBC: 5.32 MIL/uL (ref 4.22–5.81)
RDW: 15.8 % — ABNORMAL HIGH (ref 11.5–15.5)
WBC: 10.8 K/uL — ABNORMAL HIGH (ref 4.0–10.5)
nRBC: 0 % (ref 0.0–0.2)

## 2023-11-23 LAB — ECHOCARDIOGRAM COMPLETE
AR max vel: 3.24 cm2
AV Area VTI: 3.62 cm2
AV Area mean vel: 3.49 cm2
AV Mean grad: 2 mmHg
AV Peak grad: 4 mmHg
Ao pk vel: 1 m/s
Area-P 1/2: 3.83 cm2
Height: 72 in
MV VTI: 2.38 cm2
S' Lateral: 2.8 cm
Weight: 3918.9 [oz_av]

## 2023-11-23 LAB — GLUCOSE, CAPILLARY: Glucose-Capillary: 167 mg/dL — ABNORMAL HIGH (ref 70–99)

## 2023-11-23 MED ORDER — DIPHENHYDRAMINE HCL 25 MG PO CAPS
50.0000 mg | ORAL_CAPSULE | Freq: Four times a day (QID) | ORAL | Status: DC | PRN
  Administered 2023-11-23 – 2023-11-25 (×3): 50 mg via ORAL
  Filled 2023-11-23 (×3): qty 2

## 2023-11-23 MED ORDER — OXYCODONE HCL 5 MG PO TABS
5.0000 mg | ORAL_TABLET | ORAL | Status: DC | PRN
Start: 2023-11-23 — End: 2023-11-25
  Administered 2023-11-23 – 2023-11-25 (×4): 5 mg via ORAL
  Filled 2023-11-23 (×4): qty 1

## 2023-11-23 NOTE — Progress Notes (Signed)
 PROGRESS NOTE    Logan Crawford  FMW:[id number] DOB: 05-18-1975 DOA: 11/20/2023 PCP: Paseda, Folashade R, FNP  156A/156A-AA  LOS: 0 days   Brief hospital course:   Assessment & Plan: Logan Crawford is a 48 y.o. male with medical history significant of hypertension, hyperlipidemia, CKD 3A, GERD, CAD, CHF, obesity, OSA, substance use who presented for left heart cath and developed changes concerning for groin cellulitis.   Patient was admitted by cardiology yesterday due to unstable angina with worsening chest pain and fatigue in the setting of known advanced CAD status post percutaneous intervention and CABG.   Patient underwent successful catheterization with stent placement x 3 and angioplasty x 2-3.  Postoperatively and nurse noted a painful right groin mass and as patient was in the ICU this was discussed with the nurse practitioner.   Patient has history of this right groin mass that was last noted in the chart in 2021.  At that time he had had a painful intermittently draining lesion there for years.  Was treated for cellulitis with plan for follow-up for possible excision.  Patient reports that he was ultimately seen by urology and had this cyst excised from the base of his penis last year.   He reports a lesion today has developed over the past couple days and is in a different location than the previous lesion.  CT ordered by primary team and consultation with CCM NP overnight noted to show infiltration of subcutaneous fat of the right groin extending to the penis medially likely representing cellulitis.  No evidence of loculation to indicate abscess and no gas.   Patient started on Ancef  overnight and hospitalist team has been requested to take over as primary today for management of this patient's new issue.   Right groin cellulitis History of right groin cyst/lesion > Noted to have painful right groin mass overnight.  CT evaluation showed evidence of cellulitis without  evidence of loculation or gas.  Leukocytosis to 12.3.  Procalcitonin negative. > During previous evaluation it was noted that patient had had some mass with some drainage there for years.  Was treated for cellulitis in the past with plan for possible excision for this lesion/cyst/other. > Patient started on Ancef  and escalated to Vanc and ceftriaxone.  Vanc d/c'ed. --urology consulted who rec GenSurg for I/D --US  groin 9/28 still neg for fluid collection. Plan: --cont ceftriaxone --I/D tomorrow  CAD Status post recent PCI History of CABG > Initially presented for left heart cath and possible intervention due to unstable angina in the setting of advanced CAD. > Underwent successful left heart cath yesterday with stent placement x 3 and angioplasty x 2-3. --cont ASA and plavix  indefinitely - Continue rosuvastatin , Zetia , fenofibrate  --outpatient cardio f/u on 10/20   Chronic diastolic CHF > Last echo was in 2024 for and showed EF 55-60%, G2 DD, normal RV function. 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   DVT prophylaxis: Lovenox  SQ Code Status: Full code  Family Communication:  Level of care: Telemetry Medical Dispo:   The patient is from: home Anticipated d/c is to: home Anticipated d/c date is: 1-2 days  Subjective and Interval History:  Around under right groin about the same today.  GenSurg consulted today for possible I/D.   Objective: Vitals:   11/23/23 0500 11/23/23 0517 11/23/23 0844 11/23/23 1726  BP:  (!) 133/91 121/78 103/82  Pulse:  65 76 72  Resp:  18 18 17   Temp:  97.6 F (36.4 C) 97.6 F (36.4 C) 98.1 F (36.7 C)  TempSrc:    Oral   SpO2:  100% 96% 97%  Weight: 111.1 kg     Height:        Intake/Output Summary (Last 24 hours) at 11/23/2023 1753 Last data filed at 11/23/2023 1300 Gross per 24 hour  Intake 720 ml  Output --  Net 720 ml   Filed Weights   11/20/23 1138 11/22/23 0723 11/23/23 0500  Weight: 109.4 kg 109.4 kg 111.1 kg    Examination:   Constitutional: NAD, AAOx3 HEENT: conjunctivae and lids normal, EOMI CV: No cyanosis.   RESP: normal respiratory effort, on RA Extremities: No effusions, edema in BLE SKIN: right groin with an area of firmness, no fluctuance  Neuro: II - XII grossly intact.   Psych: Normal mood and affect.  Appropriate judgement and reason   Data Reviewed: I have personally reviewed labs and imaging studies  Time spent: 35 minutes  Logan Haber, MD [facility] If 7PM-7AM, please contact night-coverage 11/23/2023, 5:53 PM

## 2023-11-23 NOTE — Progress Notes (Signed)
 Urology Inpatient Progress Note  Subjective: No acute events overnight. He is afebrile, VSS. White count stable, 10.8.  Blood cultures pending with no growth at 3 days.  On antibiotics as below. He has noticed increased erythema and pain in his right inguinal region today. Ultrasound yesterday showed skin thickening and subcutaneous edema without drainable fluid collection.  Anti-infectives: Anti-infectives (From admission, onward)    Start     Dose/Rate Route Frequency Ordered Stop   11/22/23 1800  vancomycin  (VANCOREADY) IVPB 2000 mg/400 mL  Status:  Discontinued        2,000 mg 200 mL/hr over 120 Minutes Intravenous Every 24 hours 11/21/23 1634 11/22/23 1534   11/21/23 2200  cefTRIAXone (ROCEPHIN) 1 g in sodium chloride  0.9 % 100 mL IVPB        1 g 200 mL/hr over 30 Minutes Intravenous Every 24 hours 11/21/23 1818     11/21/23 1730  vancomycin  (VANCOREADY) IVPB 2000 mg/400 mL        2,000 mg 200 mL/hr over 120 Minutes Intravenous  Once 11/21/23 1632 11/21/23 1916   11/20/23 2315  ceFAZolin  (ANCEF ) IVPB 2g/100 mL premix  Status:  Discontinued        2 g 200 mL/hr over 30 Minutes Intravenous Every 8 hours 11/20/23 2225 11/21/23 1818       Current Facility-Administered Medications  Medication Dose Route Frequency Provider Last Rate Last Admin   acetaminophen  (TYLENOL ) tablet 650 mg  650 mg Oral Q4H PRN Darron Grass A, MD   650 mg at 11/21/23 1059   albuterol  (PROVENTIL ) (2.5 MG/3ML) 0.083% nebulizer solution 2.5 mg  2.5 mg Inhalation Q6H PRN Darron Grass LABOR, MD       aspirin  EC tablet 81 mg  81 mg Oral Daily Arida, Muhammad A, MD   81 mg at 11/23/23 9048   budesonide -glycopyrrolate -formoterol  (BREZTRI ) 160-9-4.8 MCG/ACT inhaler 2 puff  2 puff Inhalation BID Darron Grass A, MD   2 puff at 11/23/23 0800   cefTRIAXone (ROCEPHIN) 1 g in sodium chloride  0.9 % 100 mL IVPB  1 g Intravenous Q24H Melvin, Alexander B, MD 200 mL/hr at 11/22/23 2150 1 g at 11/22/23 2150   Chlorhexidine   Gluconate Cloth 2 % PADS 6 each  6 each Topical Daily Darron Grass LABOR, MD   6 each at 11/23/23 0957   clopidogrel  (PLAVIX ) tablet 75 mg  75 mg Oral Daily Arida, Muhammad A, MD   75 mg at 11/23/23 0950   diphenhydrAMINE  (BENADRYL ) capsule 50 mg  50 mg Oral Q6H PRN Duncan, Hazel V, MD   50 mg at 11/23/23 0536   enoxaparin  (LOVENOX ) injection 40 mg  40 mg Subcutaneous Q24H Arida, Muhammad A, MD   40 mg at 11/23/23 0950   ezetimibe  (ZETIA ) tablet 10 mg  10 mg Oral Daily Arida, Muhammad A, MD   10 mg at 11/23/23 9047   famotidine  (PEPCID ) tablet 20 mg  20 mg Oral QHS PRN Darron Grass LABOR, MD   20 mg at 11/20/23 1816   fenofibrate  tablet 54 mg  54 mg Oral Daily Darron Grass A, MD   54 mg at 11/23/23 9048   free water  500 mL  500 mL Oral Once Arida, Muhammad A, MD       isosorbide  mononitrate (IMDUR ) 24 hr tablet 30 mg  30 mg Oral Daily Agbor-Etang, Brian, MD   30 mg at 11/23/23 9048   losartan  (COZAAR ) tablet 25 mg  25 mg Oral Daily Arida, Muhammad A, MD   25  mg at 11/23/23 0950   metoprolol  succinate (TOPROL -XL) 24 hr tablet 25 mg  25 mg Oral Daily Darliss Rogue, MD   25 mg at 11/23/23 0951   nicotine  (NICODERM CQ  - dosed in mg/24 hours) patch 14 mg  14 mg Transdermal Daily Melvin, Alexander B, MD   14 mg at 11/23/23 0950   nitroGLYCERIN  50 mg in dextrose  5 % 250 mL (0.2 mg/mL) infusion  0-200 mcg/min Intravenous Titrated Darron Deatrice LABOR, MD   Stopped at 11/21/23 0701   ondansetron  (ZOFRAN ) injection 4 mg  4 mg Intravenous Q6H PRN Darron Deatrice LABOR, MD       oxyCODONE  (Oxy IR/ROXICODONE ) immediate release tablet 5 mg  5 mg Oral Q4H PRN Duncan, Hazel V, MD   5 mg at 11/22/23 2144   pantoprazole  (PROTONIX ) EC tablet 40 mg  40 mg Oral Daily Arida, Muhammad A, MD   40 mg at 11/23/23 0951   rosuvastatin  (CRESTOR ) tablet 40 mg  40 mg Oral Daily Arida, Muhammad A, MD   40 mg at 11/23/23 0950   sodium chloride  flush (NS) 0.9 % injection 3 mL  3 mL Intravenous Q12H Darron Deatrice A, MD   3 mL at  11/22/23 2151   sodium chloride  flush (NS) 0.9 % injection 3 mL  3 mL Intravenous PRN Darron Deatrice LABOR, MD       sodium chloride  flush (NS) 0.9 % injection 3 mL  3 mL Intravenous Q12H Darron Deatrice A, MD   3 mL at 11/23/23 0952   sodium chloride  flush (NS) 0.9 % injection 3 mL  3 mL Intravenous PRN Darron Deatrice LABOR, MD       spironolactone  (ALDACTONE ) tablet 25 mg  25 mg Oral Daily Arida, Muhammad A, MD   25 mg at 11/23/23 0950     Objective: Vital signs in last 24 hours: Temp:  [97.6 F (36.4 C)-99 F (37.2 C)] 97.6 F (36.4 C) (09/29 0844) Pulse Rate:  [65-76] 76 (09/29 0844) Resp:  [16-18] 18 (09/29 0844) BP: (110-133)/(62-91) 121/78 (09/29 0844) SpO2:  [96 %-100 %] 96 % (09/29 0844) FiO2 (%):  [21 %] 21 % (09/28 2210) Weight:  [111.1 kg] 111.1 kg (09/29 0500)  Intake/Output from previous day: 09/28 0701 - 09/29 0700 In: 600 [P.O.:600] Out: -  Intake/Output this shift: Total I/O In: 240 [P.O.:240] Out: -   Physical Exam Vitals and nursing note reviewed.  Constitutional:      General: He is not in acute distress.    Appearance: He is not ill-appearing, toxic-appearing or diaphoretic.  HENT:     Head: Normocephalic and atraumatic.  Pulmonary:     Effort: Pulmonary effort is normal. No respiratory distress.  Genitourinary:      Comments: Area of well-demarcated erythema and induration of the right inguinal region, circled above.  No fluctuance, crepitus, or drainage. Skin:    General: Skin is warm and dry.  Neurological:     Mental Status: He is alert and oriented to person, place, and time.  Psychiatric:        Mood and Affect: Mood normal.        Behavior: Behavior normal.     Lab Results:  Recent Labs    11/22/23 1055 11/23/23 0450  WBC 10.9* 10.8*  HGB 13.9 14.6  HCT 41.6 43.5  PLT 302 328   BMET Recent Labs    11/20/23 1652 11/21/23 0558  NA  --  137  K  --  3.8  CL  --  106  CO2  --  20*  GLUCOSE  --  97  BUN  --  13  CREATININE  1.27* 1.21  CALCIUM   --  8.3*   Studies/Results: US  RT LOWER EXTREM LTD SOFT TISSUE NON VASCULAR Result Date: 11/22/2023 CLINICAL DATA:  444066 Mass of right inguinal region [id number] EXAM: ULTRASOUND RIGHT LOWER EXTREMITY LIMITED TECHNIQUE: Ultrasound examination of the lower extremity soft tissues was performed in the area of clinical concern. COMPARISON:  November 20, 2023 FINDINGS: Targeted ultrasound was performed of the site of palpable concern in the RIGHT inguinal region. There is skin thickening with interdigitating subcutaneous edema noted in the RIGHT inguinal area. Visualized inguinal lymph nodes are unremarkable in appearance with preserved echogenic hila and thin smooth cortices. No discrete focal drainable fluid collection is identified. IMPRESSION: 1. There is skin thickening with extensive interdigitating subcutaneous edema noted in the RIGHT inguinal area. This is nonspecific but could reflect cellulitis. No discrete focal drainable fluid collection is identified. Electronically Signed   By: Corean Salter M.D.   On: 11/22/2023 11:48   Assessment & Plan: 48 y.o. male with unstable angina s/p left heart cath 2 days ago and right groin cellulitis without abscess on antibiotics.  No evidence of interval development of a drainable fluid collection on physical exam today.  Recommend continued IV antibiotics.  Will continue to follow.  If he does organize an abscess, agree with engaging general surgery at that time.  Lucie Hones, PA-C 11/23/2023

## 2023-11-23 NOTE — Consult Note (Signed)
 [facility]-General Surgery  SURGICAL CONSULTATION NOTE    HISTORY OF PRESENT ILLNESS (HPI):  48 y.o. male is s/p left heart cath with stent placement and angioplasty on 11/20/23.  During his stay, patient has been experiencing right inguinal pain concerning for cellulitis. Patient states he has had bilateral cyst on inguinal region for several weeks. Both places were looked at by dermatologist, no treatment was needed at that time since they were both asymptomatic.  He started presenting with symptoms on the right inguinal region about four days ago. Area started getting swollen and tender to touch. States the left inguinal area has remained asymptomatic.  Patient has been on several different antibiotics during this admission including ancef , vancomycin , and ceftriaxone. Symptoms are not improving.  He denies any drainage. Any urinary hesitancy or dysuria. Denies any radiating pain to genitalia. He denies any fevers or chills.  CT of abdomen and pelvis from November 20, 2023 showed a subcutaneous fat on the right region extending to the base of the penis, likely cellulitis with no loculated collection.  An ultrasound was done yesterday which showed skin thickening and subcutaneous edema, no discrete focal fluid collection.  Latest labs indicate leukocytosis of 10.8.  Otherwise normal lab values including lactic acid.  Surgery is consulted by physician Dr. Awanda in this context for evaluation and management of abscess vs cellulitis of right inguinal region.   PAST MEDICAL HISTORY (PMH):  Past Medical History:  Diagnosis Date   (HFpEF) heart failure with preserved ejection fraction (HCC)    a.) TTE 11/26/19: EF 50-55, G1DD; b.) TTE 01/24/20: EF 50, LVH, mid-apical antsep HK, G1DD; c.) TTE 05/18/21: EF 50-55, antsep/sep/apical HK, LVH, mild MR, AoV calc; d.) TTE 07/16/21: EF 60-65, LVH, G2DD, mild MD; e.) TTE 02/18/22: EF 55, sep/inf HK, LVH, G1DD, mild MR; f.) TTE 03/19/22: EF 65-70,  antsep/periapical HK, LVH, G1DD, mild MR, AoV calc; g.) TTE 02/12/2023: EF 55-60, LVH, G2DD, mild MR   Anginal pain    Aortic atherosclerosis    Bilateral pleural effusion 02/20/2022   a.) s/p CABG and LAA closure (L > R) --> progressive during admission --> ultimately required LEFT thoracentesis on 03/10/2022 (750 cc serous yield)   CAD (coronary artery disease)    a.)PCI 01/24/20: 90 mLAD (2.75x22 Res Onyx); b.)PCI 05/20/21: 85 mLAD (2.75x24 + 2.5x8 Onyx); c.)LHC 11/18/21: 80 ISR + 60 mLAD - CVTS vs stent POBA; d.)LHC 02/13/22: 70 ISR p-mLAD, 80/50 ISR mLAD, 99 ISR dLAD, 80 jailed D2, diff HG RCA Dz; e.)3v CABG 02/19/22; f.)LHC 02/03/23: 30 LM, 70 p-mLAD, 80/70 mLAD, 99 dLAD, 80 D2, 70/70 pRCA, 70/95 mRCA, 40 dRCA, 60 RV branch, 60 RPDA, 100 SVG-LAD - Rx mgmt   CKD (chronic kidney disease), stage III (HCC)    Complete heart block (HCC) 02/19/2022   a.) during CABG/LAA closure --> hyperkalemic CHB in OR before heart surgery had functionally begun (not on bypass yet); etiology unknown; ?? due to medication administration or contaminant given fairly normal preop renal function (Cr 1.3). Persisted post-bypass event with extensive dialysis equivalent via CPB pump --> Tx'd with dantrolene    Complication of anesthesia 02/19/2022   a.) perioperative HYPERkalemia - concern for atypical MH initially - Tx'd intraoperaitvely with dantrolene . Preop K+ 4.6 and CR 1.3 --> K+ peaked at 7.8 intra/postop --> rec'd furosemide  + insulin  + calcium  + extended CBP (remained on pump at least 1 hour after procedure). Consult with MH society determined unlikely MH (see Argwala, MD note 02/22/2022). No confirmatory CHCT testing.  COPD (chronic obstructive pulmonary disease) (HCC)    COVID-19 virus infection 11/25/2019   Genital warts    GERD (gastroesophageal reflux disease)    Hiatal hernia    History of 2019 novel coronavirus disease (COVID-19) 11/25/2019   History of left atrial appendage closure 02/19/2022   a.)  performed simultaneously with cardiac revascularization (CABG) procedure on 02/19/2022   Hyperlipidemia    Hypertension    Ischemic cardiomyopathy    a.) TTE 11/26/19: EF 50-55; b.)TTE 01/24/20: EF 50; c.)LHC 01/24/20: EF 45-50; d.)MV 11/29/20: EF 43; e.)TTE 05/18/21: EF 50-55; f.)LHC 05/20/21: EF 50-55; g.)TTE 07/16/21: EF 60-65; h.) TTE 11/16/21: EF 60-65; i.) LHC 11/18/21: EF 55-65; j.) TTE 02/12/22: EF 60-65; k.) TTE 02/18/22: EF 55; l.) TTE 03/19/22: EF 65-70; m.) TTE 05/01/22: EF 55; n.) MV 01/27/23: EF 45-50; o.) TTE 02/12/2023: EF 55-60   Leukocytosis    Long term current use of clopidogrel     Long-term use of aspirin  therapy    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   a.) surgical PCR (+) 02/15/2022 prior to CABG   NSTEMI (non-ST elevated myocardial infarction) (HCC) 01/23/2020   a.) troponins were trended: 3520 --> 2990 ng/L; b.) LHC/PCI 01/24/2020: 60% o-pRCA, 40% p-mRCA, 90% mLAD (2.75 x 22 mm Resolute Onyx DES), 40% dLAD   NSTEMI (non-ST elevated myocardial infarction) (HCC) 05/18/2021   a.) troponins were trended: 101 --> 110 --> 175 ng/L; b.) LHC/PCI 05/20/2021: 40% dLAD, 40% p-mRCA, 50% o-pRCA, 40% RPDA, 30% oLM, 85% mLAD with distal edge dissection (overlapping 2.75 x 34 mm and 2.5 x 8 mm Onyx Frontier DES), 60% D2   OSA on CPAP    Polysubstance abuse (HCC)    a.) cocaine, THC, opiates, tobacco   Postoperative cardiogenic shock 02/19/2022   a.) required vasopressor/ionotropic agent support + IABP   Recurrent cellulitis of thigh    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 Consult with MH society determined MH unlikely (see Argwala, MD note 02/22/2022); b.) c/b postop cardiogenic shock req vasopressor/inotrophic support + IABP; c.) c/b dev of BILATERAL pleural effusions (L > R) req LEFT thoracentesis (750 cc yield)   Scrotal lesion (sebaceous cyst)     Tobacco use 11/25/2019     PAST SURGICAL HISTORY (PSH):  Past Surgical History:  Procedure Laterality Date   CORONARY ARTERY BYPASS GRAFT N/A 02/19/2022   Procedure: CORONARY ARTERY BYPASS GRAFTING (CABG) X THREE, USING ENDOSCOPICALLY HARVESTED RIGHT GREATER SAPHENOUS VEIN  AND LEFT ATRIAL APPENDAGE CLIPPING;  Surgeon: Murriel Toribio DEL, MD;  Location: [facility];  Service: Open Heart Surgery;  Laterality: N/A;   CORONARY IMAGING/OCT N/A 11/20/2023   Procedure: CORONARY IMAGING/OCT;  Surgeon: Darron Deatrice LABOR, MD;  Location: [facility];  Service: Cardiovascular;  Laterality: N/A;   CORONARY STENT INTERVENTION N/A 01/24/2020   Procedure: CORONARY STENT INTERVENTION;  Surgeon: Darron Deatrice LABOR, MD;  Location: [facility];  Service: Cardiovascular;  Laterality: N/A;   CORONARY STENT INTERVENTION N/A 05/20/2021   Procedure: CORONARY STENT INTERVENTION;  Surgeon: Darron Deatrice LABOR, MD;  Location: [facility];  Service: Cardiovascular;  Laterality: N/A;   CORONARY STENT INTERVENTION N/A 11/20/2023   Procedure: CORONARY STENT INTERVENTION;  Surgeon: Darron Deatrice LABOR, MD;  Location: [facility];  Service: Cardiovascular;  Laterality: N/A;   CORONARY ULTRASOUND/IVUS N/A 05/20/2021   Procedure: Intravascular Ultrasound/IVUS;  Surgeon: Darron Deatrice  A, MD;  Location: [facility];  Service: Cardiovascular;  Laterality: N/A;   IRRIGATION AND DEBRIDEMENT KNEE Left    LEFT ATRIAL APPENDAGE OCCLUSION Left 02/19/2022   Procedure: LEFT ATRIAL APPENDAGE OCCLUSION; Location: [facility]; Surgeon: Toribio Colder, MD   LEFT HEART CATH AND CORONARY ANGIOGRAPHY N/A 01/24/2020   Procedure: LEFT HEART CATH AND CORONARY ANGIOGRAPHY;  Surgeon: Darron Deatrice LABOR, MD;  Location: [facility];  Service: Cardiovascular;  Laterality: N/A;   LEFT HEART CATH AND CORONARY ANGIOGRAPHY N/A 11/18/2021   Procedure: LEFT HEART CATH AND CORONARY ANGIOGRAPHY;  Surgeon: Darron Deatrice LABOR, MD;  Location: [facility];  Service: Cardiovascular;  Laterality: N/A;   LEFT HEART CATH AND CORONARY ANGIOGRAPHY N/A 02/13/2022   Procedure: LEFT HEART CATH AND CORONARY ANGIOGRAPHY;  Surgeon: Anner Alm ORN, MD;  Location: [facility];  Service: Cardiovascular;  Laterality: N/A;   LEFT HEART CATH AND CORS/GRAFTS ANGIOGRAPHY N/A 05/20/2021   Procedure: LEFT HEART CATH AND CORS/GRAFTS ANGIOGRAPHY;  Surgeon: Darron Deatrice LABOR, MD;  Location: [facility];  Service: Cardiovascular;  Laterality: N/A;   LEFT HEART CATH AND CORS/GRAFTS ANGIOGRAPHY N/A 02/03/2023   Procedure: LEFT HEART CATH AND CORS/GRAFTS ANGIOGRAPHY;  Surgeon: Mady Bruckner, MD;  Location: [facility];  Service: Cardiovascular;  Laterality: N/A;   LEFT HEART CATH AND CORS/GRAFTS ANGIOGRAPHY Left 11/20/2023   Procedure: LEFT HEART CATH AND CORS/GRAFTS ANGIOGRAPHY;  Surgeon: Darron Deatrice LABOR, MD;  Location: [facility];  Service: Cardiovascular;  Laterality: Left;   SCROTAL EXPLORATION N/A 03/06/2023   Procedure: EXCISION OF SCROTUM LESION, SEBACEOUS CYST;  Surgeon: Francisca Redell BROCKS, MD;  Location: [facility];  Service: Urology;  Laterality: N/A;   TYMPANOSTOMY TUBE PLACEMENT Bilateral      MEDICATIONS:  Prior to Admission medications   Medication Sig Start Date End Date Taking? Authorizing Provider  aspirin  EC 81 MG tablet Take 1 tablet (81 mg total) by mouth daily. Swallow whole. 08/26/23  Yes Wittenborn, Barnie, NP  clopidogrel  (PLAVIX ) 75 MG tablet Take 1 tablet (75 mg total) by mouth daily. 08/26/23  Yes Wittenborn, Barnie, NP  empagliflozin  (JARDIANCE ) 10 MG TABS tablet Take 1 tablet (10 mg total) by mouth daily. 08/26/23  Yes Wittenborn, Barnie, NP  ezetimibe  (ZETIA ) 10 MG tablet Take 1 tablet (10 mg total) by mouth daily. 08/26/23 08/25/24 Yes Paseda, Folashade R, FNP  Fluticasone -Umeclidin-Vilant (TRELEGY ELLIPTA ) 100-62.5-25 MCG/ACT AEPB Inhale 1 puff into the lungs daily. 08/26/23   Yes Paseda, Folashade R, FNP  furosemide  (LASIX ) 40 MG tablet Take 1 tablet (40 mg total) by mouth daily. 08/26/23  Yes Wittenborn, Barnie, NP  isosorbide  mononitrate (IMDUR ) 30 MG 24 hr tablet Take 1 tablet (30 mg total) by mouth daily. 04/10/23 04/09/24 Yes Bensimhon, Toribio SAUNDERS, MD  losartan  (COZAAR ) 50 MG tablet Take 1 tablet (50 mg total) by mouth daily. 08/26/23  Yes Wittenborn, Barnie, NP  metoprolol  succinate (TOPROL -XL) 25 MG 24 hr tablet Take 1 tablet (25 mg total) by mouth 2 (two) times daily. 08/26/23  Yes Wittenborn, Deborah, NP  nicotine  (NICODERM CQ  - DOSED IN MG/24 HOURS) 14 mg/24hr patch Place 1 patch (14 mg total) onto the skin daily. Patient taking differently: Place 14 mg onto the skin daily as needed (nicotine  dependence). 08/24/23  Yes Paseda, Folashade R, FNP  nitroGLYCERIN  (NITROSTAT ) 0.4 MG SL tablet Place 1 tablet (0.4 mg total) under the tongue every 5 (five) minutes as needed for chest pain (if chest pain  not resolved after 3 doses, please call 911). 02/03/23  Yes End, Lonni, MD  pantoprazole  (PROTONIX ) 40 MG tablet Take 1 tablet (40 mg total) by mouth daily. 08/26/23  Yes Paseda, Folashade R, FNP  rosuvastatin  (CRESTOR ) 40 MG tablet Take 1 tablet (40 mg total) by mouth daily. 08/26/23  Yes Paseda, Folashade R, FNP  spironolactone  (ALDACTONE ) 25 MG tablet Take 1 tablet (25 mg total) by mouth daily. 08/26/23  Yes Wittenborn, Barnie, NP  VENTOLIN  HFA 108 (90 Base) MCG/ACT inhaler Inhale 1-2 puffs into the lungs every 6 (six) hours as needed for wheezing or shortness of breath. 08/24/23  Yes Paseda, Folashade R, FNP  famotidine  (PEPCID ) 20 MG tablet Take 1 tablet (20 mg total) by mouth at bedtime as needed for heartburn or indigestion. Patient not taking: Reported on 11/19/2023 10/20/22   Paseda, Folashade R, FNP  fenofibrate  (TRICOR ) 48 MG tablet Take 1 tablet (48 mg total) by mouth daily. 09/08/23 09/07/24  Paseda, Folashade R, FNP  hydrocortisone  (ANUSOL -HC) 25 MG suppository Place 1  suppository (25 mg total) rectally 2 (two) times daily. Patient not taking: Reported on 11/19/2023 07/29/23   Paseda, Folashade R, FNP  nicotine  (NICODERM CQ  - DOSED IN MG/24 HR) 7 mg/24hr patch Place 1 patch (7 mg total) onto the skin daily. Patient not taking: Reported on 11/19/2023 10/05/23   Paseda, Folashade R, FNP  NON FORMULARY Pt uses a bi-pap nightly    [provider]  polyethylene glycol (MIRALAX ) 17 g packet Take 17 g by mouth daily as needed. 07/29/23   Paseda, Folashade R, FNP     ALLERGIES:  Allergies  Allergen Reactions   Bee Pollen     Patient states he is not allergic to bees but has seasonal allergies to pollen     SOCIAL HISTORY:  Social History   Socioeconomic History   Marital status: Single    Spouse name: Not on file   Number of children: 3   Years of education: Not on file   Highest education level: Not on file  Occupational History   Not on file  Tobacco Use   Smoking status: Some Days    Current packs/day: 1.00    Average packs/day: 1 pack/day for 10.8 years (10.8 ttl pk-yrs)    Types: Cigarettes    Start date: 02/12/2013    Passive exposure: Past   Smokeless tobacco: Never   Tobacco comments:    2-3 cigarettes daily- 08/06/2022        1/2 pack daily as of 01/2023  Vaping Use   Vaping status: Never Used  Substance and Sexual Activity   Alcohol use: Yes    Alcohol/week: 1.0 standard drink of alcohol    Types: 1 Shots of liquor per week    Comment: socially   Drug use: Yes    Comment: states takes gummies occasionally   Sexual activity: Yes  Other Topics Concern   Not on file  Social History Narrative   Lives with his uncle    Social Drivers of Corporate investment banker Strain: Not on file  Food Insecurity: No Food Insecurity (11/20/2023)   Hunger Vital Sign    Worried About Running Out of Food in the Last Year: Never true    Ran Out of Food in the Last Year: Never true  Transportation Needs: No Transportation Needs (11/20/2023)    PRAPARE - Administrator, Civil Service (Medical): No    Lack of Transportation (Non-Medical): No  Physical Activity:  Not on file  Stress: Not on file  Social Connections: Unknown (08/05/2022)   Received [facility]   Social Network    Social Network: Not on file  Intimate Partner Violence: Not At Risk (11/20/2023)   Humiliation, Afraid, Rape, and Kick questionnaire    Fear of Current or Ex-Partner: No    Emotionally Abused: No    Physically Abused: No    Sexually Abused: No     FAMILY HISTORY:  Family History  Problem Relation Age of Onset   Coronary artery disease Mother    Arrhythmia Mother        Pacemaker placed in 2021   Heart failure Mother    Breast cancer Mother    Coronary artery disease Father    Colon cancer Father    Colon polyps Father       REVIEW OF SYSTEMS:  Review of Systems  Constitutional:  Negative for chills and fever.  Respiratory:  Negative for cough and wheezing.   Cardiovascular:  Negative for chest pain and palpitations.  Genitourinary:  Negative for dysuria, frequency and urgency.    VITAL SIGNS:  Temp:  [97.6 F (36.4 C)-99 F (37.2 C)] 98.1 F (36.7 C) (09/29 1726) Pulse Rate:  [65-76] 72 (09/29 1726) Resp:  [17-18] 17 (09/29 1726) BP: (103-133)/(78-91) 103/82 (09/29 1726) SpO2:  [96 %-100 %] 97 % (09/29 1726) FiO2 (%):  [21 %] 21 % (09/28 2210) Weight:  [111.1 kg] 111.1 kg (09/29 0500)     Height: 6' (182.9 cm) Weight: 111.1 kg BMI (Calculated): 33.21   INTAKE/OUTPUT:  09/28 0701 - 09/29 0700 In: 600 [P.O.:600] Out: -   PHYSICAL EXAM:  Physical Exam Constitutional:      Appearance: Normal appearance.  HENT:     Head: Normocephalic and atraumatic.  Cardiovascular:     Rate and Rhythm: Normal rate and regular rhythm.  Pulmonary:     Effort: Pulmonary effort is normal.     Breath sounds: Normal breath sounds.  Abdominal:     Comments: Focal, indurated erythematous area, tender to touch on right inguinal  region.  Palpable cyst on left inguinal region. No signs of infection.   Neurological:     Mental Status: He is alert.      Labs:     Latest Ref Rng & Units 11/23/2023    4:50 AM 11/22/2023   10:55 AM 11/21/2023    5:58 AM  CBC  WBC 4.0 - 10.5 K/uL 10.8  10.9  12.3   Hemoglobin 13.0 - 17.0 g/dL 85.3  86.0  85.5   Hematocrit 39.0 - 52.0 % 43.5  41.6  42.4   Platelets 150 - 400 K/uL 328  302  296       Latest Ref Rng & Units 11/21/2023    5:58 AM 11/20/2023    4:52 PM 11/18/2023    3:48 PM  CMP  Glucose 70 - 99 mg/dL 97   76   BUN 6 - 20 mg/dL 13   9   Creatinine 9.38 - 1.24 mg/dL 8.78  8.72  8.42   Sodium 135 - 145 mmol/L 137   140   Potassium 3.5 - 5.1 mmol/L 3.8   4.3   Chloride 98 - 111 mmol/L 106   101   CO2 22 - 32 mmol/L 20   23   Calcium  8.9 - 10.3 mg/dL 8.3   89.9     Imaging studies:  CLINICAL DATA:  Right lower quadrant abdominal pain. Right  groin abscess.   EXAM: CT ABDOMEN AND PELVIS WITH CONTRAST   TECHNIQUE: Multidetector CT imaging of the abdomen and pelvis was performed using the standard protocol following bolus administration of intravenous contrast.   RADIATION DOSE REDUCTION: This exam was performed according to the departmental dose-optimization program which includes automated exposure control, adjustment of the mA and/or kV according to patient size and/or use of iterative reconstruction technique.   CONTRAST:  OMNIPAQUE  IOHEXOL  300 MG/ML  SOLN   COMPARISON:  06/05/2023   FINDINGS: Lower chest: Atelectasis in the lung bases.   Hepatobiliary: No focal liver abnormality is seen. No gallstones, gallbladder wall thickening, or biliary dilatation.   Pancreas: Unremarkable. No pancreatic ductal dilatation or surrounding inflammatory changes.   Spleen: Normal in size without focal abnormality.   Adrenals/Urinary Tract: Adrenal glands are unremarkable. Kidneys are normal, without renal calculi, focal lesion, or  hydronephrosis. Bladder is unremarkable.   Stomach/Bowel: Stomach, small bowel, and colon are not abnormally distended. No wall thickening or inflammatory changes. Appendix is normal.   Vascular/Lymphatic: Aortic atherosclerosis. No enlarged abdominal or pelvic lymph nodes.   Reproductive: Prostate is unremarkable.   Other: Soft tissue infiltration in the right groin region extending medially to the base of the penis. Mild skin thickening. This is likely to represent cellulitis. No loculated collection to suggest any abscesses. Right groin lymph nodes are not pathologically enlarged. No soft tissue gas. No radiopaque soft tissue foreign body.   Musculoskeletal: No acute bony abnormalities.   IMPRESSION: 1. Infiltration the subcutaneous fat of the right groin extending to the base of the penis medially. This is likely cellulitis. No loculated collections. 2. No evidence of bowel obstruction or inflammation. Appendix is normal.     Electronically Signed   By: Elsie Gravely M.D.   On: 11/20/2023 21:52  CLINICAL DATA:  [id number] Mass of right inguinal region [id number]   EXAM: ULTRASOUND RIGHT LOWER EXTREMITY LIMITED   TECHNIQUE: Ultrasound examination of the lower extremity soft tissues was performed in the area of clinical concern.   COMPARISON:  November 20, 2023   FINDINGS: Targeted ultrasound was performed of the site of palpable concern in the RIGHT inguinal region. There is skin thickening with interdigitating subcutaneous edema noted in the RIGHT inguinal area. Visualized inguinal lymph nodes are unremarkable in appearance with preserved echogenic hila and thin smooth cortices. No discrete focal drainable fluid collection is identified.   IMPRESSION: 1. There is skin thickening with extensive interdigitating subcutaneous edema noted in the RIGHT inguinal area. This is nonspecific but could reflect cellulitis. No discrete focal drainable fluid collection is  identified.     Electronically Signed   By: Corean Salter M.D.   On: 11/22/2023 11:48   Assessment/Plan: 48 y.o. male with abscess vs cellulitis of right inguinal region, complicated by pertinent comorbidities including CAD s/p cardiac stent placement on dual antiplatelet therapy, stage IIIa chronic kidney disease, CHF, hypertension, hyperlipidemia, history of polysubstance abuse, obesity, tobacco user, and GERD.   - Since symptoms are not improving with IV antibiotics, will recommend incision and drainage in the OR to alleviate pain and discomfort.  Patient agrees to proceed with incision and drainage. Will schedule for tomorrow.  - Continue dual antiplatelet therapy per cardiology recommendations  - NPO after midnight for procedure  - Continue IV Rocephin  - DVT prophylaxis with Lovenox    Thank you for the opportunity to participate in this patient's care.   -- Gilmer Cea PA-C

## 2023-11-23 NOTE — Plan of Care (Signed)
  Problem: Education: Goal: Understanding of CV disease, CV risk reduction, and recovery process will improve Outcome: Progressing Goal: Individualized Educational Video(s) Outcome: Progressing   Problem: Activity: Goal: Ability to return to baseline activity level will improve Outcome: Progressing   Problem: Cardiovascular: Goal: Ability to achieve and maintain adequate cardiovascular perfusion will improve Outcome: Progressing Goal: Vascular access site(s) Level 0-1 will be maintained Outcome: Progressing   Problem: Health Behavior/Discharge Planning: Goal: Ability to safely manage health-related needs after discharge will improve Outcome: Progressing   Problem: Education: Goal: Knowledge of General Education information will improve Description: Including pain rating scale, medication(s)/side effects and non-pharmacologic comfort measures Outcome: Progressing   Problem: Health Behavior/Discharge Planning: Goal: Ability to manage health-related needs will improve Outcome: Progressing   Problem: Clinical Measurements: Goal: Ability to maintain clinical measurements within normal limits will improve Outcome: Progressing Goal: Will remain free from infection Outcome: Progressing Goal: Diagnostic test results will improve Outcome: Progressing Goal: Respiratory complications will improve Outcome: Progressing Goal: Cardiovascular complication will be avoided Outcome: Progressing   Problem: Activity: Goal: Risk for activity intolerance will decrease Outcome: Progressing   Problem: Nutrition: Goal: Adequate nutrition will be maintained Outcome: Progressing   Problem: Coping: Goal: Level of anxiety will decrease Outcome: Progressing   Problem: Elimination: Goal: Will not experience complications related to bowel motility Outcome: Progressing Goal: Will not experience complications related to urinary retention Outcome: Progressing   Problem: Pain Managment: Goal:  General experience of comfort will improve and/or be controlled Outcome: Progressing   Problem: Safety: Goal: Ability to remain free from injury will improve Outcome: Progressing   Problem: Skin Integrity: Goal: Risk for impaired skin integrity will decrease Outcome: Progressing   Problem: Clinical Measurements: Goal: Ability to avoid or minimize complications of infection will improve Outcome: Progressing   Problem: Skin Integrity: Goal: Skin integrity will improve Outcome: Progressing

## 2023-11-23 NOTE — Progress Notes (Signed)
*  PRELIMINARY RESULTS* Echocardiogram 2D Echocardiogram has been performed.  Logan Crawford 11/23/2023, 1:49 PM

## 2023-11-23 NOTE — Progress Notes (Addendum)
 I have seen and examined the patient. I agree with the above note with the addition of : No chest pain or shortness of breath.  By physical exam, heart is regular with no murmurs.  Lungs are clear to auscultation with no significant lower extremity edema.  He does have erythema and swelling in the right groin.  I personally reviewed his echocardiogram which was done today and showed normal LV systolic function with moderate asymmetrical left ventricular hypertrophy and no significant valvular abnormalities.  Recommendations: Coronary artery disease status post CABG: Presented with unstable angina symptoms and was found to have progression of native coronary artery disease with absent Flow to the LAD with known chronically occluded SVG to LAD.  I performed successful angioplasty and 2 overlapped drug-eluting stents to the LAD with balloon angioplasty for in-stent restenosis to the mid and proximal stents.  Difficult procedure overall.  Recommend continuing aspirin  and clopidogrel  indefinitely.  Continue treatment of risk factors.  Right groin infection with possible early abscess: Currently on antibiotics.  If I&D is required, dual antiplatelet therapy cannot be safely interrupted at this time.  We will sign off.  The patient has an outpatient follow-up scheduled in our office on October 20.  [name] MD, [facility] 11/23/2023 3:27 PM         Rounding Note   Patient Name: Logan Crawford Date of Encounter: 11/23/2023  [facility] Cardiologist: Redell Cave, MD   Subjective No chest pain or dyspnea.   Scheduled Meds:  aspirin  EC  81 mg Oral Daily   budesonide -glycopyrrolate -formoterol   2 puff Inhalation BID   Chlorhexidine  Gluconate Cloth  6 each Topical Daily   clopidogrel   75 mg Oral Daily   enoxaparin  (LOVENOX ) injection  40 mg Subcutaneous Q24H   ezetimibe   10 mg Oral Daily   fenofibrate   54 mg Oral Daily   free water   500 mL Oral Once   isosorbide   mononitrate  30 mg Oral Daily   losartan   25 mg Oral Daily   metoprolol  succinate  25 mg Oral Daily   nicotine   14 mg Transdermal Daily   pantoprazole   40 mg Oral Daily   rosuvastatin   40 mg Oral Daily   sodium chloride  flush  3 mL Intravenous Q12H   sodium chloride  flush  3 mL Intravenous Q12H   spironolactone   25 mg Oral Daily   Continuous Infusions:  cefTRIAXone (ROCEPHIN)  IV 1 g (11/22/23 2150)   nitroGLYCERIN  Stopped (11/21/23 0701)   PRN Meds: acetaminophen , albuterol , diphenhydrAMINE , famotidine , ondansetron  (ZOFRAN ) IV, oxyCODONE , sodium chloride  flush, sodium chloride  flush   Vital Signs  Vitals:   11/22/23 2024 11/23/23 0500 11/23/23 0517 11/23/23 0844  BP: 129/80  (!) 133/91 121/78  Pulse: 66  65 76  Resp: 18  18 18   Temp: 99 F (37.2 C)  97.6 F (36.4 C) 97.6 F (36.4 C)  TempSrc:    Oral  SpO2: 100%  100% 96%  Weight:  111.1 kg    Height:        Intake/Output Summary (Last 24 hours) at 11/23/2023 1154 Last data filed at 11/23/2023 0900 Gross per 24 hour  Intake 600 ml  Output --  Net 600 ml      11/23/2023    5:00 AM 11/22/2023    7:23 AM 11/20/2023   11:38 AM  Last 3 Weights  Weight (lbs) 244 lb 14.9 oz 241 lb 2.2 oz 241 lb 3.2 oz  Weight (kg) 111.1 kg 109.38 kg 109.408  kg      Telemetry Sinus rhythm, heart rate 60s to 70s bpm - Personally Reviewed  ECG  No new tracings - Personally Reviewed  Physical Exam  GEN: No acute distress.   Neck: No JVD Cardiac: RRR, no murmurs, rubs, or gallops.  Respiratory: Clear to auscultation bilaterally. GI: Soft, nontender, non-distended.  MS: No edema; No deformity. Neuro:  Nonfocal  Psych: Normal affect   Labs High Sensitivity Troponin:  No results for input(s): TROPONINIHS in the last 720 hours.   Chemistry Recent Labs  Lab 11/18/23 1548 11/20/23 1652 11/21/23 0558  NA 140  --  137  K 4.3  --  3.8  CL 101  --  106  CO2 23  --  20*  GLUCOSE 76  --  97  BUN 9  --  13  CREATININE 1.57* 1.27*  1.21  CALCIUM  10.0  --  8.3*  GFRNONAA  --  >60 >60  ANIONGAP  --   --  11    Lipids No results for input(s): CHOL, TRIG, HDL, LABVLDL, LDLCALC, CHOLHDL in the last 168 hours.  Hematology Recent Labs  Lab 11/21/23 0558 11/22/23 1055 11/23/23 0450  WBC 12.3* 10.9* 10.8*  RBC 5.22 5.03 5.32  HGB 14.4 13.9 14.6  HCT 42.4 41.6 43.5  MCV 81.2 82.7 81.8  MCH 27.6 27.6 27.4  MCHC 34.0 33.4 33.6  RDW 15.7* 16.1* 15.8*  PLT 296 302 328   Thyroid No results for input(s): TSH, FREET4 in the last 168 hours.  BNPNo results for input(s): BNP, PROBNP in the last 168 hours.  DDimer No results for input(s): DDIMER in the last 168 hours.   Radiology  US  RT LOWER EXTREM LTD SOFT TISSUE NON VASCULAR Result Date: 11/22/2023 IMPRESSION: 1. There is skin thickening with extensive interdigitating subcutaneous edema noted in the RIGHT inguinal area. This is nonspecific but could reflect cellulitis. No discrete focal drainable fluid collection is identified. Electronically Signed   By: Corean Salter M.D.   On: 11/22/2023 11:48    Cardiac Studies  [facility] 11/20/2023:   Prox RCA-1 lesion is 70% stenosed.   Prox RCA-2 lesion is 70% stenosed.   Mid RCA-1 lesion is 70% stenosed.   Dist RCA lesion is 40% stenosed.   Mid LAD-1 lesion is 80% stenosed.   Mid LAD-2 lesion is 70% stenosed.   Dist LAD-1 lesion is 99% stenosed.   Origin to Insertion lesion is 100% stenosed.   RPDA lesion is 60% stenosed.   RV Branch lesion is 60% stenosed.   Mid RCA-2 lesion is 100% stenosed.   2nd Diag lesion is 95% stenosed.   Ost LM lesion is 40% stenosed.   Dist LAD-3 lesion is 60% stenosed.   Dist LAD-2 lesion is 70% stenosed.   Prox LAD to Mid LAD lesion is 70% stenosed.   A drug-eluting stent was successfully placed using a STENT ONYX FRONTIER 2.5X30.   A drug-eluting stent was successfully placed using a STENT ONYX FRONTIER 2.25X26.   Balloon angioplasty was performed using a BALLOON [place]  TREK NEO RX 3.0X20.   Balloon angioplasty was performed using a BALLOON [place] TREK NEO RX 3.0X20.   Post intervention, there is a 0% residual stenosis.   Post intervention, there is a 0% residual stenosis.   Post intervention, there is a 0% residual stenosis.   Post intervention, there is a 0% residual stenosis.   Post intervention, there is a 0% residual stenosis.   SVG and is normal in  caliber.   SVG.   LIMA and is normal in caliber.   The graft exhibits no disease.   The graft exhibits no disease.   In the absence of any other complications or medical issues, we expect the patient to be ready for discharge from an interventional cardiology perspective on 11/21/2023.   Recommend dual antiplatelet therapy with Aspirin  81mg  daily and Clopidogrel  75mg  daily.   1.  Severe underlying three-vessel coronary artery disease with patent LIMA to diagonal and patent SVG to right PDA.  The SVG to LAD is chronically occluded.  Native LAD has extensive stenting from the proximal to the distal segment with severe in-stent restenosis extending distal to the stent.  The LAD was previously getting flow from the SVG to diagonal but that is no longer the case due to progression of ostial diagonal disease. 2.  Left ventricular angiography was not performed due to chronic kidney disease.  Normal left ventricular end-diastolic pressure. 3. Successful complex angioplasty and 2 overlapped drug-eluting stent placement to the distal LAD.  OCT was used but was not diagnostic due to poor distal flow.  Balloon angioplasty for in-stent restenosis was done to the mid and proximal stent.  Extensive coronary spasm noted with every stent placement that improved with intracoronary nitroglycerin .   Recommendations: I added nitroglycerin  drip at the end of the case.  Not entirely sure why the patient is having such extensive coronary spasm.  I increased the dose of Imdur .  Will check urine drug screen. The LAD stents are high risk for  target vessel failure in the future due to extensive stenting.  Unfortunately, the vein graft to the LAD has been occluded and no options for further bypass given very diffuse distal disease.  Patient Profile   48 y.o. male with history of CAD/CABG x 3 in 2023, DES mid LAD 2021, HFpEF, hypertension, hyperlipidemia, CKD 3 presenting for an elective cath with symptoms of chest pain.  Left heart cath showed significant native disease, SVG-LAD occluded, patent LIMA to D2, patent SVG to distal RCA.  Patient underwent DES x 2 to LAD on 9/26.  Assessment & Plan   CAD/CABG, s/p DES x 2 to LAD on 9/26 via left radial arteriotomy: - Currently denies chest pain after stent placement - Continue aspirin , Plavix  75, Zetia  10, Crestor  40. - Toprol -XL 25 mg daily - Post-cath instructions Cardiac rehab  2.  Hyperlipidemia - LDL 40 - Crestor  40 mg daily, fenofibrate .  3.  Hypertension - BP controlled - Torsemide 25 mg daily, Aldactone  25, losartan  25.  4.  Right groin infection/abscess - Started on antibiotics, may need I&D/surgical consult - Management as per hospitalist team    Signed, Bernardino Bring, PA-C  11/23/2023, 11:54 AM

## 2023-11-23 NOTE — H&P (View-Only) (Signed)
 [facility]-General Surgery  SURGICAL CONSULTATION NOTE    HISTORY OF PRESENT ILLNESS (HPI):  48 y.o. male is s/p left heart cath with stent placement and angioplasty on 11/20/23.  During his stay, patient has been experiencing right inguinal pain concerning for cellulitis. Patient states he has had bilateral cyst on inguinal region for several weeks. Both places were looked at by dermatologist, no treatment was needed at that time since they were both asymptomatic.  He started presenting with symptoms on the right inguinal region about four days ago. Area started getting swollen and tender to touch. States the left inguinal area has remained asymptomatic.  Patient has been on several different antibiotics during this admission including ancef , vancomycin , and ceftriaxone. Symptoms are not improving.  He denies any drainage. Any urinary hesitancy or dysuria. Denies any radiating pain to genitalia. He denies any fevers or chills.  CT of abdomen and pelvis from November 20, 2023 showed a subcutaneous fat on the right region extending to the base of the penis, likely cellulitis with no loculated collection.  An ultrasound was done yesterday which showed skin thickening and subcutaneous edema, no discrete focal fluid collection.  Latest labs indicate leukocytosis of 10.8.  Otherwise normal lab values including lactic acid.  Surgery is consulted by physician Dr. Awanda in this context for evaluation and management of abscess vs cellulitis of right inguinal region.   PAST MEDICAL HISTORY (PMH):  Past Medical History:  Diagnosis Date   (HFpEF) heart failure with preserved ejection fraction (HCC)    a.) TTE 11/26/19: EF 50-55, G1DD; b.) TTE 01/24/20: EF 50, LVH, mid-apical antsep HK, G1DD; c.) TTE 05/18/21: EF 50-55, antsep/sep/apical HK, LVH, mild MR, AoV calc; d.) TTE 07/16/21: EF 60-65, LVH, G2DD, mild MD; e.) TTE 02/18/22: EF 55, sep/inf HK, LVH, G1DD, mild MR; f.) TTE 03/19/22: EF 65-70,  antsep/periapical HK, LVH, G1DD, mild MR, AoV calc; g.) TTE 02/12/2023: EF 55-60, LVH, G2DD, mild MR   Anginal pain    Aortic atherosclerosis    Bilateral pleural effusion 02/20/2022   a.) s/p CABG and LAA closure (L > R) --> progressive during admission --> ultimately required LEFT thoracentesis on 03/10/2022 (750 cc serous yield)   CAD (coronary artery disease)    a.)PCI 01/24/20: 90 mLAD (2.75x22 Res Onyx); b.)PCI 05/20/21: 85 mLAD (2.75x24 + 2.5x8 Onyx); c.)LHC 11/18/21: 80 ISR + 60 mLAD - CVTS vs stent POBA; d.)LHC 02/13/22: 70 ISR p-mLAD, 80/50 ISR mLAD, 99 ISR dLAD, 80 jailed D2, diff HG RCA Dz; e.)3v CABG 02/19/22; f.)LHC 02/03/23: 30 LM, 70 p-mLAD, 80/70 mLAD, 99 dLAD, 80 D2, 70/70 pRCA, 70/95 mRCA, 40 dRCA, 60 RV branch, 60 RPDA, 100 SVG-LAD - Rx mgmt   CKD (chronic kidney disease), stage III (HCC)    Complete heart block (HCC) 02/19/2022   a.) during CABG/LAA closure --> hyperkalemic CHB in OR before heart surgery had functionally begun (not on bypass yet); etiology unknown; ?? due to medication administration or contaminant given fairly normal preop renal function (Cr 1.3). Persisted post-bypass event with extensive dialysis equivalent via CPB pump --> Tx'd with dantrolene    Complication of anesthesia 02/19/2022   a.) perioperative HYPERkalemia - concern for atypical MH initially - Tx'd intraoperaitvely with dantrolene . Preop K+ 4.6 and CR 1.3 --> K+ peaked at 7.8 intra/postop --> rec'd furosemide  + insulin  + calcium  + extended CBP (remained on pump at least 1 hour after procedure). Consult with MH society determined unlikely MH (see Argwala, MD note 02/22/2022). No confirmatory CHCT testing.  COPD (chronic obstructive pulmonary disease) (HCC)    COVID-19 virus infection 11/25/2019   Genital warts    GERD (gastroesophageal reflux disease)    Hiatal hernia    History of 2019 novel coronavirus disease (COVID-19) 11/25/2019   History of left atrial appendage closure 02/19/2022   a.)  performed simultaneously with cardiac revascularization (CABG) procedure on 02/19/2022   Hyperlipidemia    Hypertension    Ischemic cardiomyopathy    a.) TTE 11/26/19: EF 50-55; b.)TTE 01/24/20: EF 50; c.)LHC 01/24/20: EF 45-50; d.)MV 11/29/20: EF 43; e.)TTE 05/18/21: EF 50-55; f.)LHC 05/20/21: EF 50-55; g.)TTE 07/16/21: EF 60-65; h.) TTE 11/16/21: EF 60-65; i.) LHC 11/18/21: EF 55-65; j.) TTE 02/12/22: EF 60-65; k.) TTE 02/18/22: EF 55; l.) TTE 03/19/22: EF 65-70; m.) TTE 05/01/22: EF 55; n.) MV 01/27/23: EF 45-50; o.) TTE 02/12/2023: EF 55-60   Leukocytosis    Long term current use of clopidogrel     Long-term use of aspirin  therapy    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   a.) surgical PCR (+) 02/15/2022 prior to CABG   NSTEMI (non-ST elevated myocardial infarction) (HCC) 01/23/2020   a.) troponins were trended: 3520 --> 2990 ng/L; b.) LHC/PCI 01/24/2020: 60% o-pRCA, 40% p-mRCA, 90% mLAD (2.75 x 22 mm Resolute Onyx DES), 40% dLAD   NSTEMI (non-ST elevated myocardial infarction) (HCC) 05/18/2021   a.) troponins were trended: 101 --> 110 --> 175 ng/L; b.) LHC/PCI 05/20/2021: 40% dLAD, 40% p-mRCA, 50% o-pRCA, 40% RPDA, 30% oLM, 85% mLAD with distal edge dissection (overlapping 2.75 x 34 mm and 2.5 x 8 mm Onyx Frontier DES), 60% D2   OSA on CPAP    Polysubstance abuse (HCC)    a.) cocaine, THC, opiates, tobacco   Postoperative cardiogenic shock 02/19/2022   a.) required vasopressor/ionotropic agent support + IABP   Recurrent cellulitis of thigh    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 Consult with MH society determined MH unlikely (see Argwala, MD note 02/22/2022); b.) c/b postop cardiogenic shock req vasopressor/inotrophic support + IABP; c.) c/b dev of BILATERAL pleural effusions (L > R) req LEFT thoracentesis (750 cc yield)   Scrotal lesion (sebaceous cyst)     Tobacco use 11/25/2019     PAST SURGICAL HISTORY (PSH):  Past Surgical History:  Procedure Laterality Date   CORONARY ARTERY BYPASS GRAFT N/A 02/19/2022   Procedure: CORONARY ARTERY BYPASS GRAFTING (CABG) X THREE, USING ENDOSCOPICALLY HARVESTED RIGHT GREATER SAPHENOUS VEIN  AND LEFT ATRIAL APPENDAGE CLIPPING;  Surgeon: Murriel Toribio DEL, MD;  Location: [facility];  Service: Open Heart Surgery;  Laterality: N/A;   CORONARY IMAGING/OCT N/A 11/20/2023   Procedure: CORONARY IMAGING/OCT;  Surgeon: Darron Deatrice LABOR, MD;  Location: [facility];  Service: Cardiovascular;  Laterality: N/A;   CORONARY STENT INTERVENTION N/A 01/24/2020   Procedure: CORONARY STENT INTERVENTION;  Surgeon: Darron Deatrice LABOR, MD;  Location: [facility];  Service: Cardiovascular;  Laterality: N/A;   CORONARY STENT INTERVENTION N/A 05/20/2021   Procedure: CORONARY STENT INTERVENTION;  Surgeon: Darron Deatrice LABOR, MD;  Location: [facility];  Service: Cardiovascular;  Laterality: N/A;   CORONARY STENT INTERVENTION N/A 11/20/2023   Procedure: CORONARY STENT INTERVENTION;  Surgeon: Darron Deatrice LABOR, MD;  Location: [facility];  Service: Cardiovascular;  Laterality: N/A;   CORONARY ULTRASOUND/IVUS N/A 05/20/2021   Procedure: Intravascular Ultrasound/IVUS;  Surgeon: Darron Deatrice  A, MD;  Location: [facility];  Service: Cardiovascular;  Laterality: N/A;   IRRIGATION AND DEBRIDEMENT KNEE Left    LEFT ATRIAL APPENDAGE OCCLUSION Left 02/19/2022   Procedure: LEFT ATRIAL APPENDAGE OCCLUSION; Location: [facility]; Surgeon: Toribio Colder, MD   LEFT HEART CATH AND CORONARY ANGIOGRAPHY N/A 01/24/2020   Procedure: LEFT HEART CATH AND CORONARY ANGIOGRAPHY;  Surgeon: Darron Deatrice LABOR, MD;  Location: [facility];  Service: Cardiovascular;  Laterality: N/A;   LEFT HEART CATH AND CORONARY ANGIOGRAPHY N/A 11/18/2021   Procedure: LEFT HEART CATH AND CORONARY ANGIOGRAPHY;  Surgeon: Darron Deatrice LABOR, MD;  Location: [facility];  Service: Cardiovascular;  Laterality: N/A;   LEFT HEART CATH AND CORONARY ANGIOGRAPHY N/A 02/13/2022   Procedure: LEFT HEART CATH AND CORONARY ANGIOGRAPHY;  Surgeon: Anner Alm ORN, MD;  Location: [facility];  Service: Cardiovascular;  Laterality: N/A;   LEFT HEART CATH AND CORS/GRAFTS ANGIOGRAPHY N/A 05/20/2021   Procedure: LEFT HEART CATH AND CORS/GRAFTS ANGIOGRAPHY;  Surgeon: Darron Deatrice LABOR, MD;  Location: [facility];  Service: Cardiovascular;  Laterality: N/A;   LEFT HEART CATH AND CORS/GRAFTS ANGIOGRAPHY N/A 02/03/2023   Procedure: LEFT HEART CATH AND CORS/GRAFTS ANGIOGRAPHY;  Surgeon: Mady Bruckner, MD;  Location: [facility];  Service: Cardiovascular;  Laterality: N/A;   LEFT HEART CATH AND CORS/GRAFTS ANGIOGRAPHY Left 11/20/2023   Procedure: LEFT HEART CATH AND CORS/GRAFTS ANGIOGRAPHY;  Surgeon: Darron Deatrice LABOR, MD;  Location: [facility];  Service: Cardiovascular;  Laterality: Left;   SCROTAL EXPLORATION N/A 03/06/2023   Procedure: EXCISION OF SCROTUM LESION, SEBACEOUS CYST;  Surgeon: Francisca Redell BROCKS, MD;  Location: [facility];  Service: Urology;  Laterality: N/A;   TYMPANOSTOMY TUBE PLACEMENT Bilateral      MEDICATIONS:  Prior to Admission medications   Medication Sig Start Date End Date Taking? Authorizing Provider  aspirin  EC 81 MG tablet Take 1 tablet (81 mg total) by mouth daily. Swallow whole. 08/26/23  Yes Wittenborn, Barnie, NP  clopidogrel  (PLAVIX ) 75 MG tablet Take 1 tablet (75 mg total) by mouth daily. 08/26/23  Yes Wittenborn, Barnie, NP  empagliflozin  (JARDIANCE ) 10 MG TABS tablet Take 1 tablet (10 mg total) by mouth daily. 08/26/23  Yes Wittenborn, Barnie, NP  ezetimibe  (ZETIA ) 10 MG tablet Take 1 tablet (10 mg total) by mouth daily. 08/26/23 08/25/24 Yes Paseda, Folashade R, FNP  Fluticasone -Umeclidin-Vilant (TRELEGY ELLIPTA ) 100-62.5-25 MCG/ACT AEPB Inhale 1 puff into the lungs daily. 08/26/23   Yes Paseda, Folashade R, FNP  furosemide  (LASIX ) 40 MG tablet Take 1 tablet (40 mg total) by mouth daily. 08/26/23  Yes Wittenborn, Barnie, NP  isosorbide  mononitrate (IMDUR ) 30 MG 24 hr tablet Take 1 tablet (30 mg total) by mouth daily. 04/10/23 04/09/24 Yes Bensimhon, Toribio SAUNDERS, MD  losartan  (COZAAR ) 50 MG tablet Take 1 tablet (50 mg total) by mouth daily. 08/26/23  Yes Wittenborn, Barnie, NP  metoprolol  succinate (TOPROL -XL) 25 MG 24 hr tablet Take 1 tablet (25 mg total) by mouth 2 (two) times daily. 08/26/23  Yes Wittenborn, Deborah, NP  nicotine  (NICODERM CQ  - DOSED IN MG/24 HOURS) 14 mg/24hr patch Place 1 patch (14 mg total) onto the skin daily. Patient taking differently: Place 14 mg onto the skin daily as needed (nicotine  dependence). 08/24/23  Yes Paseda, Folashade R, FNP  nitroGLYCERIN  (NITROSTAT ) 0.4 MG SL tablet Place 1 tablet (0.4 mg total) under the tongue every 5 (five) minutes as needed for chest pain (if chest pain  not resolved after 3 doses, please call 911). 02/03/23  Yes End, Lonni, MD  pantoprazole  (PROTONIX ) 40 MG tablet Take 1 tablet (40 mg total) by mouth daily. 08/26/23  Yes Paseda, Folashade R, FNP  rosuvastatin  (CRESTOR ) 40 MG tablet Take 1 tablet (40 mg total) by mouth daily. 08/26/23  Yes Paseda, Folashade R, FNP  spironolactone  (ALDACTONE ) 25 MG tablet Take 1 tablet (25 mg total) by mouth daily. 08/26/23  Yes Wittenborn, Barnie, NP  VENTOLIN  HFA 108 (90 Base) MCG/ACT inhaler Inhale 1-2 puffs into the lungs every 6 (six) hours as needed for wheezing or shortness of breath. 08/24/23  Yes Paseda, Folashade R, FNP  famotidine  (PEPCID ) 20 MG tablet Take 1 tablet (20 mg total) by mouth at bedtime as needed for heartburn or indigestion. Patient not taking: Reported on 11/19/2023 10/20/22   Paseda, Folashade R, FNP  fenofibrate  (TRICOR ) 48 MG tablet Take 1 tablet (48 mg total) by mouth daily. 09/08/23 09/07/24  Paseda, Folashade R, FNP  hydrocortisone  (ANUSOL -HC) 25 MG suppository Place 1  suppository (25 mg total) rectally 2 (two) times daily. Patient not taking: Reported on 11/19/2023 07/29/23   Paseda, Folashade R, FNP  nicotine  (NICODERM CQ  - DOSED IN MG/24 HR) 7 mg/24hr patch Place 1 patch (7 mg total) onto the skin daily. Patient not taking: Reported on 11/19/2023 10/05/23   Paseda, Folashade R, FNP  NON FORMULARY Pt uses a bi-pap nightly    [provider]  polyethylene glycol (MIRALAX ) 17 g packet Take 17 g by mouth daily as needed. 07/29/23   Paseda, Folashade R, FNP     ALLERGIES:  Allergies  Allergen Reactions   Bee Pollen     Patient states he is not allergic to bees but has seasonal allergies to pollen     SOCIAL HISTORY:  Social History   Socioeconomic History   Marital status: Single    Spouse name: Not on file   Number of children: 3   Years of education: Not on file   Highest education level: Not on file  Occupational History   Not on file  Tobacco Use   Smoking status: Some Days    Current packs/day: 1.00    Average packs/day: 1 pack/day for 10.8 years (10.8 ttl pk-yrs)    Types: Cigarettes    Start date: 02/12/2013    Passive exposure: Past   Smokeless tobacco: Never   Tobacco comments:    2-3 cigarettes daily- 08/06/2022        1/2 pack daily as of 01/2023  Vaping Use   Vaping status: Never Used  Substance and Sexual Activity   Alcohol use: Yes    Alcohol/week: 1.0 standard drink of alcohol    Types: 1 Shots of liquor per week    Comment: socially   Drug use: Yes    Comment: states takes gummies occasionally   Sexual activity: Yes  Other Topics Concern   Not on file  Social History Narrative   Lives with his uncle    Social Drivers of Corporate investment banker Strain: Not on file  Food Insecurity: No Food Insecurity (11/20/2023)   Hunger Vital Sign    Worried About Running Out of Food in the Last Year: Never true    Ran Out of Food in the Last Year: Never true  Transportation Needs: No Transportation Needs (11/20/2023)    PRAPARE - Administrator, Civil Service (Medical): No    Lack of Transportation (Non-Medical): No  Physical Activity:  Not on file  Stress: Not on file  Social Connections: Unknown (08/05/2022)   Received [facility]   Social Network    Social Network: Not on file  Intimate Partner Violence: Not At Risk (11/20/2023)   Humiliation, Afraid, Rape, and Kick questionnaire    Fear of Current or Ex-Partner: No    Emotionally Abused: No    Physically Abused: No    Sexually Abused: No     FAMILY HISTORY:  Family History  Problem Relation Age of Onset   Coronary artery disease Mother    Arrhythmia Mother        Pacemaker placed in 2021   Heart failure Mother    Breast cancer Mother    Coronary artery disease Father    Colon cancer Father    Colon polyps Father       REVIEW OF SYSTEMS:  Review of Systems  Constitutional:  Negative for chills and fever.  Respiratory:  Negative for cough and wheezing.   Cardiovascular:  Negative for chest pain and palpitations.  Genitourinary:  Negative for dysuria, frequency and urgency.    VITAL SIGNS:  Temp:  [97.6 F (36.4 C)-99 F (37.2 C)] 98.1 F (36.7 C) (09/29 1726) Pulse Rate:  [65-76] 72 (09/29 1726) Resp:  [17-18] 17 (09/29 1726) BP: (103-133)/(78-91) 103/82 (09/29 1726) SpO2:  [96 %-100 %] 97 % (09/29 1726) FiO2 (%):  [21 %] 21 % (09/28 2210) Weight:  [111.1 kg] 111.1 kg (09/29 0500)     Height: 6' (182.9 cm) Weight: 111.1 kg BMI (Calculated): 33.21   INTAKE/OUTPUT:  09/28 0701 - 09/29 0700 In: 600 [P.O.:600] Out: -   PHYSICAL EXAM:  Physical Exam Constitutional:      Appearance: Normal appearance.  HENT:     Head: Normocephalic and atraumatic.  Cardiovascular:     Rate and Rhythm: Normal rate and regular rhythm.  Pulmonary:     Effort: Pulmonary effort is normal.     Breath sounds: Normal breath sounds.  Abdominal:     Comments: Focal, indurated erythematous area, tender to touch on right inguinal  region.  Palpable cyst on left inguinal region. No signs of infection.   Neurological:     Mental Status: He is alert.      Labs:     Latest Ref Rng & Units 11/23/2023    4:50 AM 11/22/2023   10:55 AM 11/21/2023    5:58 AM  CBC  WBC 4.0 - 10.5 K/uL 10.8  10.9  12.3   Hemoglobin 13.0 - 17.0 g/dL 85.3  86.0  85.5   Hematocrit 39.0 - 52.0 % 43.5  41.6  42.4   Platelets 150 - 400 K/uL 328  302  296       Latest Ref Rng & Units 11/21/2023    5:58 AM 11/20/2023    4:52 PM 11/18/2023    3:48 PM  CMP  Glucose 70 - 99 mg/dL 97   76   BUN 6 - 20 mg/dL 13   9   Creatinine 9.38 - 1.24 mg/dL 8.78  8.72  8.42   Sodium 135 - 145 mmol/L 137   140   Potassium 3.5 - 5.1 mmol/L 3.8   4.3   Chloride 98 - 111 mmol/L 106   101   CO2 22 - 32 mmol/L 20   23   Calcium  8.9 - 10.3 mg/dL 8.3   89.9     Imaging studies:  CLINICAL DATA:  Right lower quadrant abdominal pain. Right  groin abscess.   EXAM: CT ABDOMEN AND PELVIS WITH CONTRAST   TECHNIQUE: Multidetector CT imaging of the abdomen and pelvis was performed using the standard protocol following bolus administration of intravenous contrast.   RADIATION DOSE REDUCTION: This exam was performed according to the departmental dose-optimization program which includes automated exposure control, adjustment of the mA and/or kV according to patient size and/or use of iterative reconstruction technique.   CONTRAST:  OMNIPAQUE  IOHEXOL  300 MG/ML  SOLN   COMPARISON:  06/05/2023   FINDINGS: Lower chest: Atelectasis in the lung bases.   Hepatobiliary: No focal liver abnormality is seen. No gallstones, gallbladder wall thickening, or biliary dilatation.   Pancreas: Unremarkable. No pancreatic ductal dilatation or surrounding inflammatory changes.   Spleen: Normal in size without focal abnormality.   Adrenals/Urinary Tract: Adrenal glands are unremarkable. Kidneys are normal, without renal calculi, focal lesion, or  hydronephrosis. Bladder is unremarkable.   Stomach/Bowel: Stomach, small bowel, and colon are not abnormally distended. No wall thickening or inflammatory changes. Appendix is normal.   Vascular/Lymphatic: Aortic atherosclerosis. No enlarged abdominal or pelvic lymph nodes.   Reproductive: Prostate is unremarkable.   Other: Soft tissue infiltration in the right groin region extending medially to the base of the penis. Mild skin thickening. This is likely to represent cellulitis. No loculated collection to suggest any abscesses. Right groin lymph nodes are not pathologically enlarged. No soft tissue gas. No radiopaque soft tissue foreign body.   Musculoskeletal: No acute bony abnormalities.   IMPRESSION: 1. Infiltration the subcutaneous fat of the right groin extending to the base of the penis medially. This is likely cellulitis. No loculated collections. 2. No evidence of bowel obstruction or inflammation. Appendix is normal.     Electronically Signed   By: Elsie Gravely M.D.   On: 11/20/2023 21:52  CLINICAL DATA:  [id number] Mass of right inguinal region [id number]   EXAM: ULTRASOUND RIGHT LOWER EXTREMITY LIMITED   TECHNIQUE: Ultrasound examination of the lower extremity soft tissues was performed in the area of clinical concern.   COMPARISON:  November 20, 2023   FINDINGS: Targeted ultrasound was performed of the site of palpable concern in the RIGHT inguinal region. There is skin thickening with interdigitating subcutaneous edema noted in the RIGHT inguinal area. Visualized inguinal lymph nodes are unremarkable in appearance with preserved echogenic hila and thin smooth cortices. No discrete focal drainable fluid collection is identified.   IMPRESSION: 1. There is skin thickening with extensive interdigitating subcutaneous edema noted in the RIGHT inguinal area. This is nonspecific but could reflect cellulitis. No discrete focal drainable fluid collection is  identified.     Electronically Signed   By: Corean Salter M.D.   On: 11/22/2023 11:48   Assessment/Plan: 48 y.o. male with abscess vs cellulitis of right inguinal region, complicated by pertinent comorbidities including CAD s/p cardiac stent placement on dual antiplatelet therapy, stage IIIa chronic kidney disease, CHF, hypertension, hyperlipidemia, history of polysubstance abuse, obesity, tobacco user, and GERD.   - Since symptoms are not improving with IV antibiotics, will recommend incision and drainage in the OR to alleviate pain and discomfort.  Patient agrees to proceed with incision and drainage. Will schedule for tomorrow.  - Continue dual antiplatelet therapy per cardiology recommendations  - NPO after midnight for procedure  - Continue IV Rocephin  - DVT prophylaxis with Lovenox    Thank you for the opportunity to participate in this patient's care.   -- Gilmer Cea PA-C

## 2023-11-24 ENCOUNTER — Other Ambulatory Visit: Payer: Self-pay

## 2023-11-24 ENCOUNTER — Encounter: Payer: Self-pay | Admitting: Cardiovascular Disease

## 2023-11-24 ENCOUNTER — Inpatient Hospital Stay: Admitting: Anesthesiology

## 2023-11-24 ENCOUNTER — Encounter: Admission: RE | Disposition: A | Payer: Self-pay | Source: Home / Self Care | Attending: Cardiovascular Disease

## 2023-11-24 ENCOUNTER — Ambulatory Visit: Admitting: Physician Assistant

## 2023-11-24 DIAGNOSIS — I200 Unstable angina: Secondary | ICD-10-CM | POA: Diagnosis not present

## 2023-11-24 HISTORY — PX: IRRIGATION AND DEBRIDEMENT ABSCESS: SHX5252

## 2023-11-24 LAB — CBC
HCT: 44.3 % (ref 39.0–52.0)
Hemoglobin: 14.6 g/dL (ref 13.0–17.0)
MCH: 27.9 pg (ref 26.0–34.0)
MCHC: 33 g/dL (ref 30.0–36.0)
MCV: 84.5 fL (ref 80.0–100.0)
Platelets: 358 K/uL (ref 150–400)
RBC: 5.24 MIL/uL (ref 4.22–5.81)
RDW: 15.9 % — ABNORMAL HIGH (ref 11.5–15.5)
WBC: 11.3 K/uL — ABNORMAL HIGH (ref 4.0–10.5)
nRBC: 0 % (ref 0.0–0.2)

## 2023-11-24 SURGERY — IRRIGATION AND DEBRIDEMENT ABSCESS
Anesthesia: General | Laterality: Right

## 2023-11-24 MED ORDER — OXYCODONE HCL 5 MG/5ML PO SOLN: 5.0000 mg | Freq: Once | ORAL | Status: DC | PRN

## 2023-11-24 MED ORDER — LACTATED RINGERS IV SOLN: INTRAVENOUS | Status: DC | PRN

## 2023-11-24 MED ORDER — FENTANYL CITRATE (PF) 100 MCG/2ML IJ SOLN
INTRAMUSCULAR | Status: DC | PRN
  Administered 2023-11-24 (×4): 25 ug via INTRAVENOUS

## 2023-11-24 MED ORDER — OXYCODONE HCL 5 MG PO TABS: 5.0000 mg | ORAL_TABLET | Freq: Once | ORAL | Status: DC | PRN

## 2023-11-24 MED ORDER — FENTANYL CITRATE (PF) 100 MCG/2ML IJ SOLN: 25.0000 ug | INTRAMUSCULAR | Status: DC | PRN

## 2023-11-24 MED ORDER — PROPOFOL 1000 MG/100ML IV EMUL
INTRAVENOUS | Status: AC
  Filled 2023-11-24: qty 100

## 2023-11-24 MED ORDER — PHENYLEPHRINE 80 MCG/ML (10ML) SYRINGE FOR IV PUSH (FOR BLOOD PRESSURE SUPPORT)
PREFILLED_SYRINGE | INTRAVENOUS | Status: AC
  Filled 2023-11-24: qty 10

## 2023-11-24 MED ORDER — POLYETHYLENE GLYCOL 3350 17 G PO PACK: 17.0000 g | PACK | Freq: Every day | ORAL | Status: DC | PRN

## 2023-11-24 MED ORDER — PHENYLEPHRINE 80 MCG/ML (10ML) SYRINGE FOR IV PUSH (FOR BLOOD PRESSURE SUPPORT)
PREFILLED_SYRINGE | INTRAVENOUS | Status: DC | PRN
  Administered 2023-11-24: 120 ug via INTRAVENOUS
  Administered 2023-11-24: 80 ug via INTRAVENOUS
  Administered 2023-11-24 (×2): 120 ug via INTRAVENOUS
  Administered 2023-11-24: 80 ug via INTRAVENOUS

## 2023-11-24 MED ORDER — BUPIVACAINE-EPINEPHRINE 0.25% -1:200000 IJ SOLN
INTRAMUSCULAR | Status: DC | PRN
  Administered 2023-11-24: 16 mL

## 2023-11-24 MED ORDER — MIDAZOLAM HCL 2 MG/2ML IJ SOLN
INTRAMUSCULAR | Status: AC
  Filled 2023-11-24: qty 2

## 2023-11-24 MED ORDER — PROPOFOL 10 MG/ML IV BOLUS
INTRAVENOUS | Status: DC | PRN
  Administered 2023-11-24: 40 mg via INTRAVENOUS

## 2023-11-24 MED ORDER — FENTANYL CITRATE (PF) 100 MCG/2ML IJ SOLN
INTRAMUSCULAR | Status: AC
  Filled 2023-11-24: qty 2

## 2023-11-24 MED ORDER — EPHEDRINE SULFATE-NACL 50-0.9 MG/10ML-% IV SOSY
PREFILLED_SYRINGE | INTRAVENOUS | Status: DC | PRN
  Administered 2023-11-24 (×3): 5 mg via INTRAVENOUS

## 2023-11-24 MED ORDER — LIDOCAINE HCL (CARDIAC) PF 100 MG/5ML IV SOSY
PREFILLED_SYRINGE | INTRAVENOUS | Status: DC | PRN
  Administered 2023-11-24: 80 mg via INTRAVENOUS

## 2023-11-24 MED ORDER — PROPOFOL 10 MG/ML IV BOLUS
INTRAVENOUS | Status: AC
  Filled 2023-11-24: qty 20

## 2023-11-24 MED ORDER — PROPOFOL 500 MG/50ML IV EMUL
INTRAVENOUS | Status: DC | PRN
  Administered 2023-11-24: 100 ug/kg/min via INTRAVENOUS

## 2023-11-24 MED ORDER — MIDAZOLAM HCL 2 MG/2ML IJ SOLN
INTRAMUSCULAR | Status: DC | PRN
Start: 2023-11-24 — End: 2023-11-24
  Administered 2023-11-24: 2 mg via INTRAVENOUS

## 2023-11-24 MED ORDER — EPHEDRINE 5 MG/ML INJ
INTRAVENOUS | Status: AC
Start: 2023-11-24 — End: 2023-11-24
  Filled 2023-11-24: qty 5

## 2023-11-24 MED ORDER — BUPIVACAINE-EPINEPHRINE (PF) 0.25% -1:200000 IJ SOLN
INTRAMUSCULAR | Status: AC
  Filled 2023-11-24: qty 30

## 2023-11-24 MED ORDER — 0.9 % SODIUM CHLORIDE (POUR BTL) OPTIME
TOPICAL | Status: DC | PRN
  Administered 2023-11-24: 250 mL

## 2023-11-24 SURGICAL SUPPLY — 29 items
BLADE CLIPPER SURG (BLADE) ×1 IMPLANT
BNDG GAUZE DERMACEA FLUFF 4 (GAUZE/BANDAGES/DRESSINGS) IMPLANT
CHLORAPREP W/TINT 26 (MISCELLANEOUS) IMPLANT
DRAIN PENROSE 12X.25 LTX STRL (MISCELLANEOUS) IMPLANT
DRAIN PENROSE 5/8X18 LTX STRL (DRAIN) IMPLANT
DRAPE LAPAROTOMY 77X122 PED (DRAPES) ×1 IMPLANT
ELECTRODE REM PT RTRN 9FT ADLT (ELECTROSURGICAL) ×1 IMPLANT
GAUZE 4X4 16PLY ~~LOC~~+RFID DBL (SPONGE) IMPLANT
GAUZE SPONGE 4X4 12PLY STRL (GAUZE/BANDAGES/DRESSINGS) IMPLANT
GLOVE BIO SURGEON STRL SZ 6.5 (GLOVE) ×1 IMPLANT
GLOVE BIOGEL PI IND STRL 6.5 (GLOVE) ×1 IMPLANT
GLOVE SURG SYN 6.5 PF PI (GLOVE) ×2 IMPLANT
GOWN STRL REUS W/ TWL LRG LVL3 (GOWN DISPOSABLE) ×3 IMPLANT
KIT TURNOVER KIT A (KITS) ×1 IMPLANT
MANIFOLD NEPTUNE II (INSTRUMENTS) ×1 IMPLANT
NDL HYPO 22X1.5 SAFETY MO (MISCELLANEOUS) ×1 IMPLANT
NEEDLE HYPO 22X1.5 SAFETY MO (MISCELLANEOUS) ×1 IMPLANT
PACK BASIN MINOR ARMC (MISCELLANEOUS) ×1 IMPLANT
PAD ABD DERMACEA PRESS 5X9 (GAUZE/BANDAGES/DRESSINGS) IMPLANT
SOLN 0.9% NACL 1000 ML (IV SOLUTION) ×1 IMPLANT
SOLN 0.9% NACL POUR BTL 1000ML (IV SOLUTION) ×1 IMPLANT
SOLUTION PREP PVP 2OZ (MISCELLANEOUS) IMPLANT
SPONGE T-LAP 18X18 ~~LOC~~+RFID (SPONGE) ×1 IMPLANT
SUTURE EHLN 3-0 FS-10 30 BLK (SUTURE) IMPLANT
SWAB CULTURE AMIES ANAERIB BLU (MISCELLANEOUS) IMPLANT
SYR 10ML LL (SYRINGE) ×1 IMPLANT
SYR BULB IRRIG 60ML STRL (SYRINGE) ×1 IMPLANT
TRAP FLUID SMOKE EVACUATOR (MISCELLANEOUS) ×1 IMPLANT
WATER STERILE IRR 500ML POUR (IV SOLUTION) ×1 IMPLANT

## 2023-11-24 NOTE — Transfer of Care (Signed)
 Immediate Anesthesia Transfer of Care Note  Patient: Logan Crawford  Procedure(s) Performed: IRRIGATION AND DEBRIDEMENT ABSCESS (Right)  Patient Location: PACU  Anesthesia Type:General  Level of Consciousness: drowsy and patient cooperative  Airway & Oxygen Therapy: Patient Spontanous Breathing and Patient connected to face mask oxygen  Post-op Assessment: Report given to RN and Post -op Vital signs reviewed and stable  Post vital signs: stable  Last Vitals:  Vitals Value Taken Time  BP 92/61 11/24/23 12:51  Temp 36.3 C 11/24/23 12:50  Pulse 78 11/24/23 12:55  Resp 13 11/24/23 12:55  SpO2 98 % 11/24/23 12:55  Vitals shown include unfiled device data.  Last Pain:  Vitals:   11/24/23 1250  TempSrc:   PainSc: 0-No pain      Patients Stated Pain Goal: 0 (11/22/23 0732)  Complications: No notable events documented.

## 2023-11-24 NOTE — Anesthesia Preprocedure Evaluation (Addendum)
 Anesthesia Evaluation  Patient identified by MRN, date of birth, ID band Patient awake    Reviewed: Allergy & Precautions, NPO status , Patient's Chart, lab work & pertinent test results  History of Anesthesia Complications (+) MALIGNANT HYPERTHERMIA and history of anesthetic complications  Airway Mallampati: III  TM Distance: >3 FB Neck ROM: full    Dental  (+) Chipped   Pulmonary sleep apnea , COPD, Current Smoker   Pulmonary exam normal        Cardiovascular Exercise Tolerance: Good hypertension, (-) angina + CAD, + Past MI, + Cardiac Stents and +CHF  Normal cardiovascular exam+ dysrhythmias      Neuro/Psych  Neuromuscular disease    GI/Hepatic Neg liver ROS, hiatal hernia,GERD  Controlled,,  Endo/Other  negative endocrine ROS    Renal/GU Renal disease  negative genitourinary   Musculoskeletal   Abdominal   Peds  Hematology negative hematology ROS (+)   Anesthesia Other Findings Patient has cardiac clearance for this procedure.  I personally comunicated with his cardiologist who reports that we can safely proceed with the patients anesthetic today.  Past Medical History: No date: (HFpEF) heart failure with preserved ejection fraction (HCC)     Comment:  a.) TTE 11/26/19: EF 50-55, G1DD; b.) TTE 01/24/20: EF               50, LVH, mid-apical antsep HK, G1DD; c.) TTE 05/18/21: EF              50-55, antsep/sep/apical HK, LVH, mild MR, AoV calc; d.)               TTE 07/16/21: EF 60-65, LVH, G2DD, mild MD; e.) TTE               02/18/22: EF 55, sep/inf HK, LVH, G1DD, mild MR; f.) TTE               03/19/22: EF 65-70, antsep/periapical HK, LVH, G1DD, mild              MR, AoV calc; g.) TTE 02/12/2023: EF 55-60, LVH, G2DD,               mild MR No date: Anginal pain No date: Aortic atherosclerosis 02/20/2022: Bilateral pleural effusion     Comment:  a.) s/p CABG and LAA closure (L > R) --> progressive                during admission --> ultimately required LEFT               thoracentesis on 03/10/2022 (750 cc serous yield) No date: CAD (coronary artery disease)     Comment:  a.)PCI 01/24/20: 90 mLAD (2.75x22 Res Onyx); b.)PCI               05/20/21: 85 mLAD (2.75x24 + 2.5x8 Onyx); c.)LHC 11/18/21:               80 ISR + 60 mLAD - CVTS vs stent POBA; d.)LHC 02/13/22:               70 ISR p-mLAD, 80/50 ISR mLAD, 99 ISR dLAD, 80 jailed D2,              diff HG RCA Dz; e.)3v CABG 02/19/22; f.)LHC 02/03/23: 30               LM, 70 p-mLAD, 80/70 mLAD, 99 dLAD, 80 D2, 70/70 pRCA,  70/95 mRCA, 40 dRCA, 60 RV branch, 60 RPDA, 100 SVG-LAD -              Rx mgmt No date: CKD (chronic kidney disease), stage III (HCC) 02/19/2022: Complete heart block (HCC)     Comment:  a.) during CABG/LAA closure --> hyperkalemic CHB in OR               before heart surgery had functionally begun (not on               bypass yet); etiology unknown; ?? due to medication               administration or contaminant given fairly normal preop               renal function (Cr 1.3). Persisted post-bypass event with              extensive dialysis equivalent via CPB pump --> Tx'd with               dantrolene  02/19/2022: Complication of anesthesia     Comment:  a.) perioperative HYPERkalemia - concern for atypical MH              initially - Tx'd intraoperaitvely with dantrolene . Preop               K+ 4.6 and CR 1.3 --> K+ peaked at 7.8 intra/postop -->               rec'd furosemide  + insulin  + calcium  + extended CBP               (remained on pump at least 1 hour after procedure).               Consult with MH society determined unlikely MH (see               Argwala, MD note 02/22/2022). No confirmatory CHCT               testing. No date: COPD (chronic obstructive pulmonary disease) (HCC) 11/25/2019: COVID-19 virus infection No date: Genital warts No date: GERD (gastroesophageal reflux disease) No date:  Hiatal hernia 11/25/2019: History of 2019 novel coronavirus disease (COVID-19) 02/19/2022: History of left atrial appendage closure     Comment:  a.) performed simultaneously with cardiac               revascularization (CABG) procedure on 02/19/2022 No date: Hyperlipidemia No date: Hypertension No date: Ischemic cardiomyopathy     Comment:  a.) TTE 11/26/19: EF 50-55; b.)TTE 01/24/20: EF 50;               c.)LHC 01/24/20: EF 45-50; d.)MV 11/29/20: EF 43; e.)TTE               05/18/21: EF 50-55; f.)LHC 05/20/21: EF 50-55; g.)TTE               07/16/21: EF 60-65; h.) TTE 11/16/21: EF 60-65; i.) LHC               11/18/21: EF 55-65; j.) TTE 02/12/22: EF 60-65; k.) TTE               02/18/22: EF 55; l.) TTE 03/19/22: EF 65-70; m.) TTE               05/01/22: EF 55; n.) MV 01/27/23: EF 45-50; o.) TTE  02/12/2023: EF 55-60 No date: Leukocytosis No date: Long term current use of clopidogrel  No date: Long-term use of aspirin  therapy 02/18/2022: Nose colonized with MRSA     Comment:  a.) surgical PCR (+) 02/15/2022 prior to CABG 01/23/2020: NSTEMI (non-ST elevated myocardial infarction) [facility])     Comment:  a.) troponins were trended: 3520 --> 2990 ng/L; b.)               LHC/PCI 01/24/2020: 60% o-pRCA, 40% p-mRCA, 90% mLAD               (2.75 x 22 mm Resolute Onyx DES), 40% dLAD 05/18/2021: NSTEMI (non-ST elevated myocardial infarction) (HCC)     Comment:  a.) troponins were trended: 101 --> 110 --> 175 ng/L;               b.) LHC/PCI 05/20/2021: 40% dLAD, 40% p-mRCA, 50% o-pRCA,              40% RPDA, 30% oLM, 85% mLAD with distal edge dissection               (overlapping 2.75 x 34 mm and 2.5 x 8 mm Onyx Frontier               DES), 60% D2 No date: OSA on CPAP No date: Polysubstance abuse (HCC)     Comment:  a.) cocaine, THC, opiates, tobacco 02/19/2022: Postoperative cardiogenic shock     Comment:  a.) required vasopressor/ionotropic agent support + IABP No date: Recurrent  cellulitis of thigh 02/19/2022: S/P CABG x 3     Comment:  a.) LIMA-D1, SVG-LAD, SVG-PDA + LAA closure; b.) c/b               periop HYPERkalemia --> initial concerns for atypical               malignant hyperthemia --> rec'd dantrolene . Consult with               MH society determined MH unlikely (see Argwala, MD note               02/22/2022); b.) c/b postop cardiogenic shock req               vasopressor/inotrophic support + IABP; c.) c/b dev of               BILATERAL pleural effusions (L > R) req LEFT               thoracentesis (750 cc yield) No date: Scrotal lesion (sebaceous cyst) 11/25/2019: Tobacco use  Past Surgical History: 02/19/2022: CORONARY ARTERY BYPASS GRAFT; N/A     Comment:  Procedure: CORONARY ARTERY BYPASS GRAFTING (CABG) X               THREE, USING ENDOSCOPICALLY HARVESTED RIGHT GREATER               SAPHENOUS VEIN  AND LEFT ATRIAL APPENDAGE CLIPPING;                Surgeon: Murriel Toribio DEL, MD;  Location: [facility] OR;  Service:              Open Heart Surgery;  Laterality: N/A; 11/20/2023: CORONARY IMAGING/OCT; N/A     Comment:  Procedure: CORONARY IMAGING/OCT;  Surgeon: Darron Deatrice LABOR, MD;  Location: [facility] INVASIVE CV [facility];  Service: Cardiovascular;  Laterality: N/A; 01/24/2020: CORONARY STENT INTERVENTION; N/A     Comment:  Procedure: CORONARY STENT INTERVENTION;  Surgeon: Darron Deatrice LABOR, MD;  Location: [facility] IN[facility] CV [facility];                Service: Cardiovascular;  Laterality: N/A; 05/20/2021: CORONARY STENT INTERVENTION; N/A     Comment:  Procedure: CORONARY STENT INTERVENTION;  Surgeon: Darron Deatrice LABOR, MD;  Location: [facility] IN[facility] CV [facility];                Service: Cardiovascular;  Laterality: N/A; 11/20/2023: CORONARY STENT INTERVENTION; N/A     Comment:  Procedure: CORONARY STENT INTERVENTION;  Surgeon: Darron Deatrice LABOR, MD;  Location: [facility] IN[facility] CV [facility];                Service:  Cardiovascular;  Laterality: N/A; 05/20/2021: CORONARY ULTRASOUND/IVUS; N/A     Comment:  Procedure: Intravascular Ultrasound/IVUS;  Surgeon:               Darron Deatrice LABOR, MD;  Location: [facility];                Service: Cardiovascular;  Laterality: N/A; No date: IRRIGATION AND DEBRIDEMENT KNEE; Left 02/19/2022: LEFT ATRIAL APPENDAGE OCCLUSION; Left     Comment:  Procedure: LEFT ATRIAL APPENDAGE OCCLUSION; Location:               [facility]; Surgeon: Toribio Colder, MD 01/24/2020: LEFT HEART CATH AND CORONARY ANGIOGRAPHY; N/A     Comment:  Procedure: LEFT HEART CATH AND CORONARY ANGIOGRAPHY;                Surgeon: Darron Deatrice LABOR, MD;  Location: [facility] INV[facility]               CV LAB;  Service: Cardiovascular;  Laterality: N/A; 11/18/2021: LEFT HEART CATH AND CORONARY ANGIOGRAPHY; N/A     Comment:  Procedure: LEFT HEART CATH AND CORONARY ANGIOGRAPHY;                Surgeon: Darron Deatrice LABOR, MD;  Location: [facility]               CV LAB;  Service: Cardiovascular;  Laterality: N/A; 02/13/2022: LEFT HEART CATH AND CORONARY ANGIOGRAPHY; N/A     Comment:  Procedure: LEFT HEART CATH AND CORONARY ANGIOGRAPHY;                Surgeon: Anner Alm ORN, MD;  Location: [facility]               CV LAB;  Service: Cardiovascular;  Laterality: N/A; 05/20/2021: LEFT HEART CATH AND CORS/GRAFTS ANGIOGRAPHY; N/A     Comment:  Procedure: LEFT HEART CATH AND CORS/GRAFTS ANGIOGRAPHY;               Surgeon: Darron Deatrice LABOR, MD;  Location: [facility]               CV LAB;  Service: Cardiovascular;  Laterality: N/A; 02/03/2023: LEFT HEART CATH AND CORS/GRAFTS ANGIOGRAPHY; N/A     Comment:  Procedure: LEFT HEART CATH AND CORS/GRAFTS ANGIOGRAPHY;               Surgeon: Mady Bruckner, MD;  Location: [facility]  CV LAB;  Service: Cardiovascular;  Laterality: N/A; 11/20/2023: LEFT HEART CATH AND CORS/GRAFTS ANGIOGRAPHY; Left     Comment:  Procedure: LEFT HEART CATH AND  CORS/GRAFTS ANGIOGRAPHY;               Surgeon: Darron Deatrice LABOR, MD;  Location: [facility]               CV LAB;  Service: Cardiovascular;  Laterality: Left; 03/06/2023: SCROTAL EXPLORATION; N/A     Comment:  Procedure: EXCISION OF SCROTUM LESION, SEBACEOUS CYST;                Surgeon: Francisca Redell BROCKS, MD;  Location: [facility] ORS;                Service: Urology;  Laterality: N/A; No date: TYMPANOSTOMY TUBE PLACEMENT; Bilateral  BMI    Body Mass Index: 33.22 kg/m      Reproductive/Obstetrics negative OB ROS                              Anesthesia Physical Anesthesia Plan  ASA: 3  Anesthesia Plan: General   Post-op Pain Management:    Induction: Intravenous  PONV Risk Score and Plan: Propofol  infusion and TIVA  Airway Management Planned: Natural Airway and Nasal Cannula  Additional Equipment:   Intra-op Plan:   Post-operative Plan:   Informed Consent: I have reviewed the patients History and Physical, chart, labs and discussed the procedure including the risks, benefits and alternatives for the proposed anesthesia with the patient or authorized representative who has indicated his/her understanding and acceptance.     Dental Advisory Given  Plan Discussed with: Anesthesiologist, CRNA and Surgeon  Anesthesia Plan Comments: (Possible atypical MH history. Plan for a non triggering anesthetic.  Patient consented for risks of anesthesia including but not limited to:  - adverse reactions to medications - risk of airway placement if required - damage to eyes, teeth, lips or other oral mucosa - nerve damage due to positioning  - sore throat or hoarseness - Damage to heart, brain, nerves, lungs, other parts of body or loss of life  Patient voiced understanding and assent.)         Anesthesia Quick Evaluation

## 2023-11-24 NOTE — Op Note (Signed)
 Preoperative diagnosis: Right groin abscess   Postoperative diagnosis: Right groin abscess  Procedure: Incision and drainage of right groin abscess.  Anesthesia: Sedation and local  Surgeon: Dr. Rodolph, MD  Wound Classification: Contaminated  Indications: Patient is a 48 y.o. male  presented with an abscess on the right groin not responding to antibiotic therapy  Findings:  1. Abscess on the right groin 2. Abundant purulent secretions drained and cultured 3. Adequate hemostasis.   Description of procedure: The patient was placed in the supine position and light sedation was induced. The right groin was prepped and draped in the usual sterile fashion. A timeout was completed verifying correct patient, procedure, site, positioning, and implant(s) and/or special equipment prior to beginning this procedure.  A skin incision was made on the right groin. The wound was opened and purulent secretions was drained. With a hemostat blunt dissection of septas was done to be able to drain the abscess completely. The cavity flushed with saline until clear saline was aspirated. The wound was packed with an wet to dry packing and wound left opened. Sterile dressing left in place. Local anesthesia infiltrated away of the infected area to block the area.  The patient tolerated the procedure well and was taken to the postanesthesia care unit in satisfactory condition.   Specimen: Tissue culture  Complications: None  Estimated Blood Loss: Minimal

## 2023-11-24 NOTE — Plan of Care (Signed)
  Problem: Activity: Goal: Ability to return to baseline activity level will improve Outcome: Progressing   Problem: Cardiovascular: Goal: Vascular access site(s) Level 0-1 will be maintained Outcome: Progressing   Problem: Health Behavior/Discharge Planning: Goal: Ability to safely manage health-related needs after discharge will improve Outcome: Progressing   Problem: Education: Goal: Knowledge of General Education information will improve Description: Including pain rating scale, medication(s)/side effects and non-pharmacologic comfort measures Outcome: Progressing   Problem: Health Behavior/Discharge Planning: Goal: Ability to manage health-related needs will improve Outcome: Progressing   Problem: Clinical Measurements: Goal: Will remain free from infection Outcome: Progressing Goal: Diagnostic test results will improve Outcome: Progressing Goal: Cardiovascular complication will be avoided Outcome: Progressing   Problem: Activity: Goal: Risk for activity intolerance will decrease Outcome: Progressing   Problem: Nutrition: Goal: Adequate nutrition will be maintained Outcome: Progressing   Problem: Coping: Goal: Level of anxiety will decrease Outcome: Progressing

## 2023-11-24 NOTE — Interval H&P Note (Signed)
 History and Physical Interval Note:  11/24/2023 11:54 AM  Logan Crawford  has presented today for surgery, with the diagnosis of right groin abscess.  The various methods of treatment have been discussed with the patient and family. After consideration of risks, benefits and other options for treatment, the patient has consented to  Procedure(s) with comments: IRRIGATION AND DEBRIDEMENT ABSCESS (Right) - right groin as a surgical intervention.  The patient's history has been reviewed, patient examined, no change in status, stable for surgery.  I have reviewed the patient's chart and labs.  Questions were answered to the patient's satisfaction.     Lucas Sjogren

## 2023-11-24 NOTE — Plan of Care (Signed)
  Problem: Nutrition: Goal: Adequate nutrition will be maintained Outcome: Progressing   Problem: Activity: Goal: Risk for activity intolerance will decrease Outcome: Progressing   Problem: Pain Managment: Goal: General experience of comfort will improve and/or be controlled Outcome: Progressing   Problem: Coping: Goal: Level of anxiety will decrease Outcome: Progressing

## 2023-11-24 NOTE — Progress Notes (Signed)
 PROGRESS NOTE    Logan Crawford  FMW:[id number] DOB: 04/27/1975 DOA: 11/20/2023 PCP: Paseda, Folashade R, FNP  156A/156A-AA  LOS: 1 day   Brief hospital course:   Assessment & Plan: Logan Crawford is a 48 y.o. male with medical history significant of hypertension, hyperlipidemia, CKD 3A, GERD, CAD, CHF, obesity, OSA, substance use who presented for left heart cath and developed changes concerning for groin cellulitis.   Patient was admitted by cardiology yesterday due to unstable angina with worsening chest pain and fatigue in the setting of known advanced CAD status post percutaneous intervention and CABG.   Patient underwent successful catheterization with stent placement x 3 and angioplasty x 2-3.  Postoperatively and nurse noted a painful right groin mass and as patient was in the ICU this was discussed with the nurse practitioner.   Patient has history of this right groin mass that was last noted in the chart in 2021.  At that time he had had a painful intermittently draining lesion there for years.  Was treated for cellulitis with plan for follow-up for possible excision.  Patient reports that he was ultimately seen by urology and had this cyst excised from the base of his penis last year.   He reports a lesion today has developed over the past couple days and is in a different location than the previous lesion.  CT ordered by primary team and consultation with CCM NP overnight noted to show infiltration of subcutaneous fat of the right groin extending to the penis medially likely representing cellulitis.  No evidence of loculation to indicate abscess and no gas.   Patient started on Ancef  overnight and hospitalist team has been requested to take over as primary today for management of this patient's new issue.   Right groin cellulitis History of right groin cyst/lesion > Noted to have painful right groin mass overnight.  CT evaluation showed evidence of cellulitis without  evidence of loculation or gas.  Leukocytosis to 12.3.  Procalcitonin negative. > During previous evaluation it was noted that patient had had some mass with some drainage there for years.  Was treated for cellulitis in the past with plan for possible excision for this lesion/cyst/other. > Patient started on Ancef  and escalated to Vanc and ceftriaxone.  Vanc d/c'ed. --urology consulted who rec GenSurg for I/D --US  groin 9/28 still neg for fluid collection. --surgical I/D today, found purulent secretions.  Abscess cx sent. --cont ceftriaxone --GenSurg to clear for discharge, and also to give wound care/dressing change instruction for after discharge.  CAD Status post recent PCI History of CABG > Initially presented for left heart cath and possible intervention due to unstable angina in the setting of advanced CAD. > Underwent successful left heart cath yesterday with stent placement x 3 and angioplasty x 2-3. --cont ASA and plavix  indefinitely - Continue rosuvastatin , Zetia , fenofibrate  --outpatient cardio f/u on 10/20   Chronic diastolic CHF > Last echo was in 2024 for and showed EF 55-60%, G2 DD, normal RV function. 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   DVT prophylaxis: Lovenox  SQ Code Status: Full code  Family Communication:  Level of care: Telemetry Medical Dispo:   The patient is from: home Anticipated d/c is to: home Anticipated d/c date is: likely tomorrow   Subjective and Interval History:  Pt underwent I/D of right groin abscess today.     Objective: Vitals:   11/24/23 1321 11/24/23 1330 11/24/23 1544 11/24/23  1920  BP: 107/76 109/74 106/73 120/71  Pulse: 63 72 72 73  Resp: 10 13 17 16   Temp: (!) 97 F (36.1 C)  98.6 F (37 C) 98.4 F (36.9 C)  TempSrc:      SpO2: 96% 95% 99% 96%  Weight:      Height:        Intake/Output Summary (Last 24 hours) at 11/24/2023 2001 Last data filed at 11/24/2023 1900 Gross per 24 hour  Intake 780 ml  Output 0 ml  Net 780 ml   Filed Weights   11/23/23 0500 11/24/23 0445 11/24/23 1116  Weight: 111.1 kg 111.1 kg 111.1 kg    Examination:   Constitutional: NAD, AAOx3 HEENT: conjunctivae and lids normal, EOMI CV: No cyanosis.   RESP: normal respiratory effort, on RA Neuro: II - XII grossly intact.   Psych: Normal mood and affect.  Appropriate judgement and reason   Data Reviewed: I have personally reviewed labs and imaging studies  Time spent: 35 minutes  Ellouise Haber, MD [facility] If 7PM-7AM, please contact night-coverage 11/24/2023, 8:01 PM

## 2023-11-24 NOTE — Plan of Care (Signed)
  Problem: Education: Goal: Understanding of CV disease, CV risk reduction, and recovery process will improve Outcome: Progressing Goal: Individualized Educational Video(s) Outcome: Progressing   Problem: Activity: Goal: Ability to return to baseline activity level will improve Outcome: Progressing   Problem: Cardiovascular: Goal: Ability to achieve and maintain adequate cardiovascular perfusion will improve Outcome: Progressing Goal: Vascular access site(s) Level 0-1 will be maintained Outcome: Progressing   Problem: Health Behavior/Discharge Planning: Goal: Ability to safely manage health-related needs after discharge will improve Outcome: Progressing   Problem: Education: Goal: Knowledge of General Education information will improve Description: Including pain rating scale, medication(s)/side effects and non-pharmacologic comfort measures Outcome: Progressing   Problem: Health Behavior/Discharge Planning: Goal: Ability to manage health-related needs will improve Outcome: Progressing   Problem: Clinical Measurements: Goal: Ability to maintain clinical measurements within normal limits will improve Outcome: Progressing Goal: Will remain free from infection Outcome: Progressing Goal: Diagnostic test results will improve Outcome: Progressing Goal: Respiratory complications will improve Outcome: Progressing Goal: Cardiovascular complication will be avoided Outcome: Progressing   Problem: Activity: Goal: Risk for activity intolerance will decrease Outcome: Progressing   Problem: Nutrition: Goal: Adequate nutrition will be maintained Outcome: Progressing   Problem: Coping: Goal: Level of anxiety will decrease Outcome: Progressing   Problem: Elimination: Goal: Will not experience complications related to bowel motility Outcome: Progressing Goal: Will not experience complications related to urinary retention Outcome: Progressing   Problem: Pain Managment: Goal:  General experience of comfort will improve and/or be controlled Outcome: Progressing   Problem: Safety: Goal: Ability to remain free from injury will improve Outcome: Progressing   Problem: Skin Integrity: Goal: Risk for impaired skin integrity will decrease Outcome: Progressing   Problem: Clinical Measurements: Goal: Ability to avoid or minimize complications of infection will improve Outcome: Progressing   Problem: Skin Integrity: Goal: Skin integrity will improve Outcome: Progressing

## 2023-11-25 ENCOUNTER — Encounter: Payer: Self-pay | Admitting: General Surgery

## 2023-11-25 DIAGNOSIS — I200 Unstable angina: Secondary | ICD-10-CM | POA: Diagnosis not present

## 2023-11-25 LAB — CBC
HCT: 43.6 % (ref 39.0–52.0)
Hemoglobin: 14.1 g/dL (ref 13.0–17.0)
MCH: 27.3 pg (ref 26.0–34.0)
MCHC: 32.3 g/dL (ref 30.0–36.0)
MCV: 84.5 fL (ref 80.0–100.0)
Platelets: 331 K/uL (ref 150–400)
RBC: 5.16 MIL/uL (ref 4.22–5.81)
RDW: 16 % — ABNORMAL HIGH (ref 11.5–15.5)
WBC: 8.6 K/uL (ref 4.0–10.5)
nRBC: 0 % (ref 0.0–0.2)

## 2023-11-25 LAB — CULTURE, BLOOD (ROUTINE X 2)
Culture: NO GROWTH
Culture: NO GROWTH

## 2023-11-25 MED ORDER — ASPIRIN 81 MG PO TBEC: 81.0000 mg | DELAYED_RELEASE_TABLET | Freq: Every day | ORAL | 3 refills | Status: AC

## 2023-11-25 MED ORDER — OXYCODONE HCL 5 MG PO TABS: 5.0000 mg | ORAL_TABLET | Freq: Four times a day (QID) | ORAL | 0 refills | Status: AC | PRN

## 2023-11-25 MED ORDER — METOPROLOL SUCCINATE ER 25 MG PO TB24: 25.0000 mg | ORAL_TABLET | Freq: Every day | ORAL | 1 refills | Status: AC

## 2023-11-25 MED ORDER — ACETAMINOPHEN 325 MG PO TABS: 650.0000 mg | ORAL_TABLET | ORAL | 0 refills | Status: AC | PRN

## 2023-11-25 MED ORDER — LOSARTAN POTASSIUM 25 MG PO TABS: 25.0000 mg | ORAL_TABLET | Freq: Every day | ORAL | 1 refills | Status: AC

## 2023-11-25 MED ORDER — SULFAMETHOXAZOLE-TRIMETHOPRIM 800-160 MG PO TABS
1.0000 | ORAL_TABLET | Freq: Two times a day (BID) | ORAL | 0 refills | Status: AC
Start: 2023-11-25 — End: 2023-11-30

## 2023-11-25 MED ORDER — CLOPIDOGREL BISULFATE 75 MG PO TABS: 75.0000 mg | ORAL_TABLET | Freq: Every day | ORAL | 3 refills | Status: AC

## 2023-11-25 NOTE — Plan of Care (Signed)
  Problem: Skin Integrity: Goal: Skin integrity will improve Outcome: Progressing   Problem: Pain Managment: Goal: General experience of comfort will improve and/or be controlled Outcome: Progressing   Problem: Nutrition: Goal: Adequate nutrition will be maintained Outcome: Progressing

## 2023-11-25 NOTE — Discharge Instructions (Signed)
 Wound care: May shower with soapy water  and pat dry (do not rub incisions), but no baths or submerging incision underwater until follow-up. (no swimming) Change wet to dry dressing daily. Use Vashe wound solution and gauze to pack the area.   Call office [phone]) at any time if any questions, worsening pain, fevers/chills, bleeding, drainage from incision site, or other concerns.

## 2023-11-25 NOTE — Anesthesia Postprocedure Evaluation (Signed)
 Anesthesia Post Note  Patient: Logan Crawford  Procedure(s) Performed: IRRIGATION AND DEBRIDEMENT ABSCESS (Right)  Patient location during evaluation: PACU Anesthesia Type: General Level of consciousness: awake and alert Pain management: pain level controlled Vital Signs Assessment: post-procedure vital signs reviewed and stable Respiratory status: spontaneous breathing, nonlabored ventilation and respiratory function stable Cardiovascular status: blood pressure returned to baseline and stable Postop Assessment: no apparent nausea or vomiting Anesthetic complications: no   No notable events documented.   Last Vitals:  Vitals:   11/24/23 1920 11/25/23 0630  BP: 120/71 132/82  Pulse: 73 68  Resp: 16 18  Temp: 36.9 C 36.7 C  SpO2: 96% 97%    Last Pain:  Vitals:   11/25/23 0840  TempSrc:   PainSc: 5                  Fairy POUR Patti Shorb

## 2023-11-25 NOTE — Discharge Summary (Signed)
 Physician Discharge Summary   Patient: Logan Crawford MRN: [id number] DOB: 05-10-1975  Admit date:     11/20/2023  Discharge date: 11/25/23  Discharge Physician: Laree Lock   PCP: Paseda, Folashade R, FNP   Recommendations at discharge:   Follow up with PCP in 1 week - Repeat BMP, CBC  Follow up with General Surgery outpatient in 2 weeks Follow up with Cardiology outpatient on 10/20  Discharge Diagnoses: Principal Problem:   Unstable angina [facility]) Active Problems:   Chest pain   CAD (coronary artery disease)   Stage 3a chronic kidney disease (HCC)   Polysubstance abuse (HCC)   Hypertension   Hyperlipidemia   Chronic diastolic CHF (congestive heart failure) (HCC)   Obesity with body mass index (BMI) of 30.0 to 39.9   GERD (gastroesophageal reflux disease)   S/P CABG x 3   Other emphysema (HCC)   Scrotal cyst   OSA (obstructive sleep apnea)   Cellulitis of groin, right   Hospital Course: Logan Crawford is a 48 y.o. male with medical history significant of hypertension, hyperlipidemia, CKD 3A, GERD, CAD, CHF, obesity, OSA, substance use who presented for left heart cath and developed changes concerning for groin cellulitis.   Patient was admitted by cardiology yesterday due to unstable angina with worsening chest pain and fatigue in the setting of known advanced CAD status post percutaneous intervention and CABG. Patient underwent successful catheterization with stent placement x 3 and angioplasty x 2-3  Postoperatively and nurse noted a painful right groin mass. Hospital course as below  Right groin cellulitis History of right groin cyst/lesion - Noted to have painful right groin mass,  CT evaluation showed evidence of cellulitis without evidence of loculation or gas.  Leukocytosis to 12.3.  Procalcitonin negative. - During previous evaluation it was noted that patient had had some mass with some drainage there for years.  Was treated for cellulitis in the past  with plan for possible excision for this lesion/cyst/other. - Urology was consulted, who rec General Surgery for I&D - surgical I/D 09/30, found purulent secretions.  Abscess cx pending - Was started on Ancef  and escalated to Vanc and ceftriaxone. Vanc d/c'ed. Discharged on Bactrim  to complete 10 days - wound care/dressing change instruction for after discharge.Follow up outpatient   CAD Status post recent PCI History of CABG - Initially presented for left heart cath and possible intervention due to unstable angina in the setting of advanced CAD. - Underwent successful left heart cath 09/29 with stent placement x 3 and angioplasty x 2-3. - cont ASA and plavix  indefinitely - Continue rosuvastatin , Zetia , fenofibrate  - outpatient cardio f/u on 10/20   Chronic diastolic CHF - Last echo was in 2024 for and showed EF 55-60%, G2 DD, normal RV function. - home lasix  held - cont aldactone , Toprol  and Losartan  - outpatient cardio f/u   Hypertension - cont Imdur , losartan , Toprol  and Aldactone    Hyperlipidemia - Continue home rosuvastatin , Zetia , fenofibrate    GERD - cont PPI   OSA - Continue home BiPAP   Obesity class 1, BMI 32.7   Substance use - Reportedly no cocaine use for 7-8 months  Consultants: General Surgery, Cardiology, Urology Disposition: Home Diet recommendation:  Discharge Diet Orders (From admission, onward)     Start     Ordered   11/25/23 0000  Diet - low sodium heart healthy        11/25/23 1357            DISCHARGE MEDICATION: Allergies  as of 11/25/2023       Reactions   Bee Pollen    Patient states he is not allergic to bees but has seasonal allergies to pollen        Medication List     PAUSE taking these medications    furosemide  40 MG tablet Wait to take this until your doctor or other care provider tells you to start again. Commonly known as: LASIX  Take 1 tablet (40 mg total) by mouth daily.       TAKE these medications     acetaminophen  325 MG tablet Commonly known as: TYLENOL  Take 2 tablets (650 mg total) by mouth every 4 (four) hours as needed for headache or mild pain (pain score 1-3).   aspirin  EC 81 MG tablet Take 1 tablet (81 mg total) by mouth daily. Swallow whole.   clopidogrel  75 MG tablet Commonly known as: PLAVIX  Take 1 tablet (75 mg total) by mouth daily.   ezetimibe  10 MG tablet Commonly known as: Zetia  Take 1 tablet (10 mg total) by mouth daily.   famotidine  20 MG tablet Commonly known as: PEPCID  Take 1 tablet (20 mg total) by mouth at bedtime as needed for heartburn or indigestion.   fenofibrate  48 MG tablet Commonly known as: Tricor  Take 1 tablet (48 mg total) by mouth daily.   hydrocortisone  25 MG suppository Commonly known as: ANUSOL -HC Place 1 suppository (25 mg total) rectally 2 (two) times daily.   isosorbide  mononitrate 30 MG 24 hr tablet Commonly known as: IMDUR  Take 1 tablet (30 mg total) by mouth daily.   Jardiance  10 MG Tabs tablet Generic drug: empagliflozin  Take 1 tablet (10 mg total) by mouth daily.   losartan  25 MG tablet Commonly known as: COZAAR  Take 1 tablet (25 mg total) by mouth daily. Start taking on: November 26, 2023 What changed:  medication strength how much to take   metoprolol  succinate 25 MG 24 hr tablet Commonly known as: TOPROL -XL Take 1 tablet (25 mg total) by mouth daily. What changed: when to take this   nicotine  14 mg/24hr patch Commonly known as: NICODERM CQ  - dosed in mg/24 hours Place 1 patch (14 mg total) onto the skin daily. What changed:  when to take this reasons to take this Another medication with the same name was removed. Continue taking this medication, and follow the directions you see here.   nitroGLYCERIN  0.4 MG SL tablet Commonly known as: NITROSTAT  Place 1 tablet (0.4 mg total) under the tongue every 5 (five) minutes as needed for chest pain (if chest pain not resolved after 3 doses, please call 911).   NON  FORMULARY Pt uses a bi-pap nightly   oxyCODONE  5 MG immediate release tablet Commonly known as: Oxy IR/ROXICODONE  Take 1 tablet (5 mg total) by mouth every 6 (six) hours as needed for severe pain (pain score 7-10).   pantoprazole  40 MG tablet Commonly known as: PROTONIX  Take 1 tablet (40 mg total) by mouth daily.   polyethylene glycol 17 g packet Commonly known as: MiraLax  Take 17 g by mouth daily as needed.   rosuvastatin  40 MG tablet Commonly known as: CRESTOR  Take 1 tablet (40 mg total) by mouth daily.   spironolactone  25 MG tablet Commonly known as: ALDACTONE  Take 1 tablet (25 mg total) by mouth daily.   sulfamethoxazole -trimethoprim  800-160 MG tablet Commonly known as: Bactrim  DS Take 1 tablet by mouth 2 (two) times daily for 5 days.   Trelegy Ellipta  100-62.5-25 MCG/ACT Aepb Generic drug: Fluticasone -Umeclidin-Vilant  Inhale 1 puff into the lungs daily.   Ventolin  HFA 108 (90 Base) MCG/ACT inhaler Generic drug: albuterol  Inhale 1-2 puffs into the lungs every 6 (six) hours as needed for wheezing or shortness of breath.               Discharge Care Instructions  (From admission, onward)           Start     Ordered   11/25/23 0000  Discharge wound care:       Comments: Vashe solution and gauze. Recommend daily dressing changes at home   11/25/23 1357            Follow-up Information     Rodolph Romano, MD Follow up in 2 week(s).   Specialty: General Surgery Why: right inguinal abscess I&D 2 week f/u Contact information: [address] [place] [address] [phone]                Discharge Exam: Filed Weights   11/24/23 0445 11/24/23 1116 11/25/23 0342  Weight: 111.1 kg 111.1 kg 112.9 kg    Constitutional: NAD, AAOx3 CV: No cyanosis.   RESP: normal respiratory effort, on RA Neuro: II - XII grossly intact.   Psych: Normal mood and affect.  Appropriate judgement and reason  Condition at discharge: good  The  results of significant diagnostics from this hospitalization (including imaging, microbiology, ancillary and laboratory) are listed below for reference.   Imaging Studies: ECHOCARDIOGRAM COMPLETE Result Date: 11/23/2023    ECHOCARDIOGRAM REPORT   Patient Name:   KIA STAVROS Date of Exam: 11/23/2023 Medical Rec #:  [id number]        Height:       72.0 in Accession #:    [id number]       Weight:       244.9 lb Date of Birth:  1975-04-05       BSA:          2.322 m Patient Age:    47 years         BP:           121/78 mmHg Patient Gender: M                HR:           76 bpm. Exam Location:  [facility] Procedure: 2D Echo, Cardiac Doppler and Color Doppler (Both Spectral and Color            Flow Doppler were utilized during procedure). Indications:     Chest pain R07.9  History:         Patient has prior history of Echocardiogram examinations, most                  recent 02/12/2023. CAD; Risk Factors:Hypertension.  Sonographer:     Christopher Furnace Referring Phys:  [id number] REDELL CAVE Diagnosing Phys: [name] MD IMPRESSIONS  1. Left ventricular ejection fraction, by estimation, is 60 to 65%. The left ventricle has normal function. The left ventricle has no regional wall motion abnormalities. There is moderate asymmetric left ventricular hypertrophy of the septal segment. Left ventricular diastolic parameters are consistent with Grade I diastolic dysfunction (impaired relaxation).  2. Right ventricular systolic function is normal. The right ventricular size is normal. Tricuspid regurgitation signal is inadequate for assessing PA pressure.  3. The mitral valve is normal in structure. No evidence of mitral valve regurgitation. No evidence of mitral stenosis.  4. The aortic valve is normal  in structure. Aortic valve regurgitation is not visualized. Aortic valve sclerosis/calcification is present, without any evidence of aortic stenosis. FINDINGS  Left Ventricle: Left ventricular ejection fraction, by estimation,  is 60 to 65%. The left ventricle has normal function. The left ventricle has no regional wall motion abnormalities. The left ventricular internal cavity size was normal in size. There is  moderate asymmetric left ventricular hypertrophy of the septal segment. Left ventricular diastolic parameters are consistent with Grade I diastolic dysfunction (impaired relaxation). Right Ventricle: The right ventricular size is normal. No increase in right ventricular wall thickness. Right ventricular systolic function is normal. Tricuspid regurgitation signal is inadequate for assessing PA pressure. Left Atrium: Left atrial size was normal in size. Right Atrium: Right atrial size was normal in size. Pericardium: There is no evidence of pericardial effusion. Mitral Valve: The mitral valve is normal in structure. No evidence of mitral valve regurgitation. No evidence of mitral valve stenosis. MV peak gradient, 3.1 mmHg. The mean mitral valve gradient is 1.0 mmHg. Tricuspid Valve: The tricuspid valve is normal in structure. Tricuspid valve regurgitation is not demonstrated. No evidence of tricuspid stenosis. Aortic Valve: The aortic valve is normal in structure. Aortic valve regurgitation is not visualized. Aortic valve sclerosis/calcification is present, without any evidence of aortic stenosis. Aortic valve mean gradient measures 2.0 mmHg. Aortic valve peak  gradient measures 4.0 mmHg. Aortic valve area, by VTI measures 3.62 cm. Pulmonic Valve: The pulmonic valve was normal in structure. Pulmonic valve regurgitation is not visualized. No evidence of pulmonic stenosis. Aorta: The aortic root is normal in size and structure. Venous: The inferior vena cava was not well visualized. IAS/Shunts: No atrial level shunt detected by color flow Doppler.  LEFT VENTRICLE PLAX 2D LVIDd:         4.20 cm   Diastology LVIDs:         2.80 cm   LV e' medial:    4.24 cm/s LV PW:         0.90 cm   LV E/e' medial:  17.2 LV IVS:        1.20 cm   LV e'  lateral:   5.98 cm/s LVOT diam:     2.00 cm   LV E/e' lateral: 12.2 LV SV:         64 LV SV Index:   28 LVOT Area:     3.14 cm  RIGHT VENTRICLE RV Basal diam:  2.60 cm RV Mid diam:    2.20 cm RV S prime:     8.59 cm/s TAPSE (M-mode): 1.5 cm LEFT ATRIUM             Index        RIGHT ATRIUM           Index LA diam:        3.60 cm 1.55 cm/m   RA Area:     12.00 cm LA Vol (A2C):   28.1 ml 12.10 ml/m  RA Volume:   25.10 ml  10.81 ml/m LA Vol (A4C):   40.1 ml 17.27 ml/m LA Biplane Vol: 36.9 ml 15.89 ml/m  AORTIC VALVE AV Area (Vmax):    3.24 cm AV Area (Vmean):   3.49 cm AV Area (VTI):     3.62 cm AV Vmax:           100.00 cm/s AV Vmean:          71.450 cm/s AV VTI:  0.178 m AV Peak Grad:      4.0 mmHg AV Mean Grad:      2.0 mmHg LVOT Vmax:         103.00 cm/s LVOT Vmean:        79.300 cm/s LVOT VTI:          0.205 m LVOT/AV VTI ratio: 1.15  AORTA Ao Root diam: 3.00 cm MITRAL VALVE               TRICUSPID VALVE MV Area (PHT): 3.83 cm    TR Peak grad:   12.4 mmHg MV Area VTI:   2.38 cm    TR Vmax:        176.00 cm/s MV Peak grad:  3.1 mmHg MV Mean grad:  1.0 mmHg    SHUNTS MV Vmax:       0.88 m/s    Systemic VTI:  0.20 m MV Vmean:      56.2 cm/s   Systemic Diam: 2.00 cm MV Decel Time: 198 msec MV E velocity: 72.80 cm/s MV A velocity: 86.50 cm/s MV E/A ratio:  0.84 [name] MD Electronically signed by [name] MD Signature Date/Time: 11/23/2023/2:11:23 PM    Final    US  RT LOWER EXTREM LTD SOFT TISSUE NON VASCULAR Result Date: 11/22/2023 CLINICAL DATA:  [id number] Mass of right inguinal region [id number] EXAM: ULTRASOUND RIGHT LOWER EXTREMITY LIMITED TECHNIQUE: Ultrasound examination of the lower extremity soft tissues was performed in the area of clinical concern. COMPARISON:  November 20, 2023 FINDINGS: Targeted ultrasound was performed of the site of palpable concern in the RIGHT inguinal region. There is skin thickening with interdigitating subcutaneous edema noted in the RIGHT inguinal  area. Visualized inguinal lymph nodes are unremarkable in appearance with preserved echogenic hila and thin smooth cortices. No discrete focal drainable fluid collection is identified. IMPRESSION: 1. There is skin thickening with extensive interdigitating subcutaneous edema noted in the RIGHT inguinal area. This is nonspecific but could reflect cellulitis. No discrete focal drainable fluid collection is identified. Electronically Signed   By: Corean Salter M.D.   On: 11/22/2023 11:48   CT ABDOMEN PELVIS W CONTRAST Result Date: 11/20/2023 CLINICAL DATA:  Right lower quadrant abdominal pain. Right groin abscess. EXAM: CT ABDOMEN AND PELVIS WITH CONTRAST TECHNIQUE: Multidetector CT imaging of the abdomen and pelvis was performed using the standard protocol following bolus administration of intravenous contrast. RADIATION DOSE REDUCTION: This exam was performed according to the departmental dose-optimization program which includes automated exposure control, adjustment of the mA and/or kV according to patient size and/or use of iterative reconstruction technique. CONTRAST:  OMNIPAQUE  IOHEXOL  300 MG/ML  SOLN COMPARISON:  06/05/2023 FINDINGS: Lower chest: Atelectasis in the lung bases. Hepatobiliary: No focal liver abnormality is seen. No gallstones, gallbladder wall thickening, or biliary dilatation. Pancreas: Unremarkable. No pancreatic ductal dilatation or surrounding inflammatory changes. Spleen: Normal in size without focal abnormality. Adrenals/Urinary Tract: Adrenal glands are unremarkable. Kidneys are normal, without renal calculi, focal lesion, or hydronephrosis. Bladder is unremarkable. Stomach/Bowel: Stomach, small bowel, and colon are not abnormally distended. No wall thickening or inflammatory changes. Appendix is normal. Vascular/Lymphatic: Aortic atherosclerosis. No enlarged abdominal or pelvic lymph nodes. Reproductive: Prostate is unremarkable. Other: Soft tissue infiltration in the right  groin region extending medially to the base of the penis. Mild skin thickening. This is likely to represent cellulitis. No loculated collection to suggest any abscesses. Right groin lymph nodes are not pathologically enlarged. No soft tissue gas. No radiopaque soft tissue  foreign body. Musculoskeletal: No acute bony abnormalities. IMPRESSION: 1. Infiltration the subcutaneous fat of the right groin extending to the base of the penis medially. This is likely cellulitis. No loculated collections. 2. No evidence of bowel obstruction or inflammation. Appendix is normal. Electronically Signed   By: Elsie Gravely M.D.   On: 11/20/2023 21:52   CARDIAC CATHETERIZATION Result Date: 11/20/2023   Prox RCA-1 lesion is 70% stenosed.   Prox RCA-2 lesion is 70% stenosed.   Mid RCA-1 lesion is 70% stenosed.   Dist RCA lesion is 40% stenosed.   Mid LAD-1 lesion is 80% stenosed.   Mid LAD-2 lesion is 70% stenosed.   Dist LAD-1 lesion is 99% stenosed.   Origin to Insertion lesion is 100% stenosed.   RPDA lesion is 60% stenosed.   RV Branch lesion is 60% stenosed.   Mid RCA-2 lesion is 100% stenosed.   2nd Diag lesion is 95% stenosed.   Ost LM lesion is 40% stenosed.   Dist LAD-3 lesion is 60% stenosed.   Dist LAD-2 lesion is 70% stenosed.   Prox LAD to Mid LAD lesion is 70% stenosed.   A drug-eluting stent was successfully placed using a STENT ONYX FRONTIER 2.5X30.   A drug-eluting stent was successfully placed using a STENT ONYX FRONTIER 2.25X26.   Balloon angioplasty was performed using a BALLOON [place] TREK NEO RX 3.0X20.   Balloon angioplasty was performed using a BALLOON [place] TREK NEO RX 3.0X20.   Post intervention, there is a 0% residual stenosis.   Post intervention, there is a 0% residual stenosis.   Post intervention, there is a 0% residual stenosis.   Post intervention, there is a 0% residual stenosis.   Post intervention, there is a 0% residual stenosis.   SVG and is normal in caliber.   SVG.   LIMA and is normal in caliber.    The graft exhibits no disease.   The graft exhibits no disease.   In the absence of any other complications or medical issues, we expect the patient to be ready for discharge from an interventional cardiology perspective on 11/21/2023.   Recommend dual antiplatelet therapy with Aspirin  81mg  daily and Clopidogrel  75mg  daily. 1.  Severe underlying three-vessel coronary artery disease with patent LIMA to diagonal and patent SVG to right PDA.  The SVG to LAD is chronically occluded.  Native LAD has extensive stenting from the proximal to the distal segment with severe in-stent restenosis extending distal to the stent.  The LAD was previously getting flow from the SVG to diagonal but that is no longer the case due to progression of ostial diagonal disease. 2.  Left ventricular angiography was not performed due to chronic kidney disease.  Normal left ventricular end-diastolic pressure. 3. Successful complex angioplasty and 2 overlapped drug-eluting stent placement to the distal LAD.  OCT was used but was not diagnostic due to poor distal flow.  Balloon angioplasty for in-stent restenosis was done to the mid and proximal stent.  Extensive coronary spasm noted with every stent placement that improved with intracoronary nitroglycerin . Recommendations: I added nitroglycerin  drip at the end of the case.  Not entirely sure why the patient is having such extensive coronary spasm.  I increased the dose of Imdur .  Will check urine drug screen. The LAD stents are high risk for target vessel failure in the future due to extensive stenting.  Unfortunately, the vein graft to the LAD has been occluded and no options for further bypass given very diffuse  distal disease.    Microbiology: Results for orders placed or performed during the hospital encounter of 11/20/23  MRSA Next Gen by PCR, Nasal     Status: None   Collection Time: 11/20/23  4:37 PM   Specimen: Nasal Mucosa; Nasal Swab  Result Value Ref Range Status   MRSA by  PCR Next Gen NOT DETECTED NOT DETECTED Final    Comment: (NOTE) The GeneXpert MRSA Assay (FDA approved for NASAL specimens only), is one component of a comprehensive MRSA colonization surveillance program. It is not intended to diagnose MRSA infection nor to guide or monitor treatment for MRSA infections. Test performance is not FDA approved in patients less than 77 years old. Performed at [facility], [address] [place], [place] [address]   Culture, blood (Routine X 2) w Reflex to ID Panel     Status: None   Collection Time: 11/20/23 10:39 PM   Specimen: BLOOD RIGHT HAND  Result Value Ref Range Status   Specimen Description BLOOD RIGHT HAND  Final   Special Requests   Final    BOTTLES DRAWN AEROBIC ONLY Blood Culture results may not be optimal due to an inadequate volume of blood received in culture bottles   Culture   Final    NO GROWTH 5 DAYS Performed at [facility], [address] [place], [place] [address]    Report Status 11/25/2023 FINAL  Final  Culture, blood (Routine X 2) w Reflex to ID Panel     Status: None   Collection Time: 11/20/23 10:41 PM   Specimen: BLOOD LEFT HAND  Result Value Ref Range Status   Specimen Description BLOOD LEFT HAND  Final   Special Requests   Final    BOTTLES DRAWN AEROBIC ONLY Blood Culture results may not be optimal due to an inadequate volume of blood received in culture bottles   Culture   Final    NO GROWTH 5 DAYS Performed at [facility], [address], [place], [place] [address]    Report Status 11/25/2023 FINAL  Final  Surgical pcr screen     Status: Abnormal   Collection Time: 11/21/23  5:19 PM   Specimen: Nasal Mucosa; Nasal Swab  Result Value Ref Range Status   MRSA, PCR NEGATIVE NEGATIVE Final   Staphylococcus aureus POSITIVE (A) NEGATIVE Final    Comment: (NOTE) The Xpert SA Assay (FDA approved for NASAL specimens in patients 77 years of age and older), is one component of a  comprehensive surveillance program. It is not intended to diagnose infection nor to guide or monitor treatment. Performed at [facility], [address] [place], [place] [address]   Aerobic/Anaerobic Culture w Gram Stain (surgical/deep wound)     Status: None (Preliminary result)   Collection Time: 11/24/23 12:36 PM   Specimen: Groin, Right; Abscess  Result Value Ref Range Status   Specimen Description RIGHT GROIN ABCESS  Final   Special Requests TISSUE  Final   Gram Stain RARE WBC SEEN NO ORGANISMS SEEN   Final   Culture   Final    TOO YOUNG TO READ Performed at [facility], [address] [place], [place] [address]    Report Status PENDING  Incomplete  Aerobic/Anaerobic Culture w Gram Stain (surgical/deep wound)     Status: None (Preliminary result)   Collection Time: 11/24/23 12:38 PM   Specimen: Groin, Right; Abscess  Result Value Ref Range Status   Specimen Description   Final  GROIN Performed at [facility], [address] [place], [place] [address]    Special Requests   Final    RIGHT GROIN ABCESS Performed at [facility], [address] [place], [place] [address]    Gram Stain RARE WBC SEEN NO ORGANISMS SEEN   Final   Culture   Final    TOO YOUNG TO READ Performed at [facility], [address] [place], [place] [address]    Report Status PENDING  Incomplete    Labs: CBC: Recent Labs  Lab 11/21/23 0558 11/22/23 1055 11/23/23 0450 11/24/23 0528 11/25/23 0610  WBC 12.3* 10.9* 10.8* 11.3* 8.6  HGB 14.4 13.9 14.6 14.6 14.1  HCT 42.4 41.6 43.5 44.3 43.6  MCV 81.2 82.7 81.8 84.5 84.5  PLT 296 302 328 358 331   Basic Metabolic Panel: Recent Labs  Lab 11/18/23 1548 11/20/23 1652 11/21/23 0558  NA 140  --  137  K 4.3  --  3.8  CL 101  --  106  CO2 23  --  20*  GLUCOSE 76  --  97  BUN 9  --  13  CREATININE 1.57* 1.27* 1.21  CALCIUM  10.0  --  8.3*   Liver Function Tests: No results for input(s):  AST, ALT, ALKPHOS, BILITOT, PROT, ALBUMIN  in the last 168 hours. CBG: Recent Labs  Lab 11/20/23 1639  GLUCAP 167*    Discharge time spent: greater than 30 minutes.  Signed: Laree Lock, MD [facility] 11/25/2023

## 2023-11-25 NOTE — Progress Notes (Signed)
 [facility]- General Surgery  SURGICAL PROGRESS NOTE  Hospital Day(s): 2.   Post op day(s): 1 Day Post-Op.   Interval History:  Patient is status postop day 1 of incision and drainage of right groin abscess.  States he is feeling much better since the procedure. Area is less tender and less indurated. Does not have any complaints.   Vital signs in last 24 hours: [min-max] current  Temp:  [97 F (36.1 C)-98.6 F (37 C)] 98.1 F (36.7 C) (10/01 0630) Pulse Rate:  [63-77] 68 (10/01 0630) Resp:  [10-18] 18 (10/01 0630) BP: (75-132)/(53-82) 132/82 (10/01 0630) SpO2:  [94 %-99 %] 97 % (10/01 0630) FiO2 (%):  [21 %] 21 % (09/30 2317) Weight:  [111.1 kg-112.9 kg] 112.9 kg (10/01 0342)     Height: 6' (182.9 cm) Weight: 112.9 kg BMI (Calculated): 33.75   Intake/Output last 2 shifts:  09/30 0701 - 10/01 0700 In: 340 [P.O.:240; I.V.:100] Out: 0    Physical Exam:  Constitutional: alert, cooperative and no distress  Respiratory: breathing non-labored at rest  Cardiovascular: regular rate and sinus rhythm  Integumentary: re-packed site on right groin, it appears clean and dry. No purulent drainage or other sign of infection. Less TTP and less indurated.   Labs:     Latest Ref Rng & Units 11/25/2023    6:10 AM 11/24/2023    5:28 AM 11/23/2023    4:50 AM  CBC  WBC 4.0 - 10.5 K/uL 8.6  11.3  10.8   Hemoglobin 13.0 - 17.0 g/dL 85.8  85.3  85.3   Hematocrit 39.0 - 52.0 % 43.6  44.3  43.5   Platelets 150 - 400 K/uL 331  358  328       Latest Ref Rng & Units 11/21/2023    5:58 AM 11/20/2023    4:52 PM 11/18/2023    3:48 PM  CMP  Glucose 70 - 99 mg/dL 97   76   BUN 6 - 20 mg/dL 13   9   Creatinine 9.38 - 1.24 mg/dL 8.78  8.72  8.42   Sodium 135 - 145 mmol/L 137   140   Potassium 3.5 - 5.1 mmol/L 3.8   4.3   Chloride 98 - 111 mmol/L 106   101   CO2 22 - 32 mmol/L 20   23   Calcium  8.9 - 10.3 mg/dL 8.3   89.9     Imaging studies: No new pertinent imaging  studies   Assessment/Plan:  48 y.o. male with right groin abscess 1 Day Post-Op s/p Incision and drainage of right groin abscess, complicated by pertinent comorbidities including CAD s/p cardiac stent placement on dual antiplatelet therapy, stage IIIa chronic kidney disease, CHF, hypertension, hyperlipidemia, history of polysubstance abuse, obesity, tobacco user, and GERD.    - Stable vital signs, no fever and not tachycardic    - Leukocytosis has resolved    - Dr. Cesar repacked site this morning with Vashe solution and gauze showing patient how to do it. Recommend daily dressing changes at home. No signs of infection. Area healing as expected. Pain improving.   - No contraindication for discharge from surgical standpoint. Will provide wound instructions and set up a two week follow-up apt outpatient with Dr. Cesar.   -- Gilmer Cea PA-C

## 2023-11-25 NOTE — Progress Notes (Signed)
 Pt. Is being discharged to home. He is alert and oriented.  Able to make needs known to staff. Pt is ambulatory at will.  Discharge instructions have been educated on with understanding. IV's to right and left forearm have been removed. Catheter and tip intact. Both sites are  covered with gauge and tape. Staff to wheel chair pt down to lobby for pick.

## 2023-11-26 ENCOUNTER — Telehealth: Payer: Self-pay

## 2023-11-26 NOTE — Transitions of Care (Post Inpatient/ED Visit) (Signed)
 11/26/2023  Name: Logan Crawford MRN: [id number] DOB: [birth year]  Today's TOC FU Call Status:   Patient's Name and Date of Birth confirmed.  Transition Care Management Follow-up Telephone Call Date of Discharge: 11/25/23 How have you been since you were released from the hospital?: Better Any questions or concerns?: No  Items Reviewed: Did you receive and understand the discharge instructions provided?: Yes Medications obtained,verified, and reconciled?: Yes (Medications Reviewed) Any new allergies since your discharge?: No Dietary orders reviewed?: Yes Type of Diet Ordered:: low salt heart healthy Do you have support at home?: Yes People in Home [RPT]: friend(s) Name of Support/Comfort Primary Source: terrea Fear  Medications Reviewed Today: Medications Reviewed Today     Reviewed by Orvetta Danielski E, RN (Registered Nurse) on 11/26/23 at [id number]  Med List Status: <None>   Medication Order Taking? Sig Documenting Provider Last Dose Status Informant  acetaminophen  (TYLENOL ) 325 MG tablet [id number] Yes Take 2 tablets (650 mg total) by mouth every 4 (four) hours as needed for headache or mild pain (pain score 1-3). Jerelene Critchley, MD  Active   aspirin  EC 81 MG tablet [id number] Yes Take 1 tablet (81 mg total) by mouth daily. Swallow whole. Jerelene Critchley, MD  Active   clopidogrel  (PLAVIX ) 75 MG tablet [id number] Yes Take 1 tablet (75 mg total) by mouth daily. Jerelene Critchley, MD  Active   empagliflozin  (JARDIANCE ) 10 MG TABS tablet [id number] Yes Take 1 tablet (10 mg total) by mouth daily. Loistine Sober, NP  Active Self  ezetimibe  (ZETIA ) 10 MG tablet [id number] Yes Take 1 tablet (10 mg total) by mouth daily. Paseda, Folashade R, FNP  Active Self  famotidine  (PEPCID ) 20 MG tablet [id number] Yes Take 1 tablet (20 mg total) by mouth at bedtime as needed for heartburn or indigestion. Paseda, Folashade R, FNP  Active Self  fenofibrate  (TRICOR ) 48 MG tablet [id number] Yes Take 1  tablet (48 mg total) by mouth daily. Paseda, Folashade R, FNP  Active Self           Med Note SOILA LYLE JAYSON [place] Nov 19, 2023  9:46 AM) Has not started yet, will speak with MD before starting  Fluticasone -Umeclidin-Vilant (TRELEGY ELLIPTA ) 100-62.5-25 MCG/ACT AEPB [id number] Yes Inhale 1 puff into the lungs daily. Paseda, Folashade R, FNP  Active Self  furosemide  (LASIX ) 40 MG tablet [id number]  Take 1 tablet (40 mg total) by mouth daily. Loistine Sober, NP  Active Self  hydrocortisone  (ANUSOL -HC) 25 MG suppository [id number]  Place 1 suppository (25 mg total) rectally 2 (two) times daily.  Patient not taking: Reported on 11/26/2023   Paseda, Folashade R, FNP  Active Self  isosorbide  mononitrate (IMDUR ) 30 MG 24 hr tablet [id number] Yes Take 1 tablet (30 mg total) by mouth daily. Bensimhon, Daniel R, MD  Active Self  losartan  (COZAAR ) 25 MG tablet [id number] Yes Take 1 tablet (25 mg total) by mouth daily. Ponnala, Shruthi, MD  Active   metoprolol  succinate (TOPROL -XL) 25 MG 24 hr tablet [id number] Yes Take 1 tablet (25 mg total) by mouth daily. Jerelene Critchley, MD  Active   nicotine  (NICODERM CQ  - DOSED IN MG/24 HOURS) 14 mg/24hr patch [id number] Yes Place 1 patch (14 mg total) onto the skin daily.  Patient taking differently: Place 14 mg onto the skin daily as needed (nicotine  dependence).   Paseda, Folashade R, FNP  Active Self  nitroGLYCERIN  (NITROSTAT ) 0.4 MG SL tablet [id number] Yes Place 1 tablet (0.4 mg total) under the tongue every 5 (five)  minutes as needed for chest pain (if chest pain not resolved after 3 doses, please call 911). Mady Bruckner, MD  Active Self  JESSE SCHLOSSMAN [id number]  Pt uses a bi-pap nightly [provider]  Active Self  oxyCODONE  (OXY IR/ROXICODONE ) 5 MG immediate release tablet [id number] Yes Take 1 tablet (5 mg total) by mouth every 6 (six) hours as needed for severe pain (pain score 7-10). Jerelene Critchley, MD  Active   pantoprazole  (PROTONIX ) 40 MG  tablet [id number] Yes Take 1 tablet (40 mg total) by mouth daily. Paseda, Folashade R, FNP  Active Self  polyethylene glycol (MIRALAX ) 17 g packet [id number] Yes Take 17 g by mouth daily as needed. Paseda, Folashade R, FNP  Active Self  rosuvastatin  (CRESTOR ) 40 MG tablet [id number] Yes Take 1 tablet (40 mg total) by mouth daily. Paseda, Folashade R, FNP  Active Self  spironolactone  (ALDACTONE ) 25 MG tablet [id number] Yes Take 1 tablet (25 mg total) by mouth daily. Loistine Sober, NP  Active Self  sulfamethoxazole -trimethoprim  (BACTRIM  DS) 800-160 MG tablet [id number] Yes Take 1 tablet by mouth 2 (two) times daily for 5 days. Jerelene Critchley, MD  Active   VENTOLIN  HFA 108 (90 Base) MCG/ACT inhaler [id number] Yes Inhale 1-2 puffs into the lungs every 6 (six) hours as needed for wheezing or shortness of breath. Paseda, Folashade R, FNP  Active Self            Home Care and Equipment/Supplies: Were Home Health Services Ordered?: No Any new equipment or medical supplies ordered?: No  Functional Questionnaire: Do you need assistance with bathing/showering or dressing?: No Do you need assistance with meal preparation?: No Do you need assistance with eating?: No Do you have difficulty maintaining continence: No Do you need assistance with getting out of bed/getting out of a chair/moving?: No Do you have difficulty managing or taking your medications?: No  Follow up appointments reviewed: PCP Follow-up appointment confirmed?: Yes Date of PCP follow-up appointment?: 12/02/23 Follow-up Provider: folashade [facility] Follow-up appointment confirmed?: Yes Date of Specialist follow-up appointment?: 12/01/23 Follow-Up Specialty Provider:: brenda Sandridge Do you need transportation to your follow-up appointment?: No Do you understand care options if your condition(s) worsen?: Yes-patient verbalized understanding  Discussed and offered 30 day TOC program.  Patient  declined.  The  patient has been provided with contact information for the care management team and has been advised to call with any health -related questions or concerns.  The patient verbalized understanding with current plan of care.  The patient is directed to their insurance card regarding availability of benefits coverage.    Arvin Seip RN, BSN, CCM [employer], Population Health Case Manager Phone: [phone]

## 2023-11-26 NOTE — Patient Instructions (Signed)
 Visit Information  Thank you for taking time to visit with me today. Please don't hesitate to contact me if I can be of assistance to you   Patient instructions: Take medications as prescribed Take antibiotic until completed Notify provider of any new/ ongoing symptoms\ Keep follow up appointments with provider Continue dressing changes to right groin as advised by provider Seek emergency medical services for severe symptoms:  shortness of breath/ chest pain Notify provider for signs of infection: redness, drainage, increase pain, swelling, odor, fever >100.5   Patient verbalizes understanding of instructions and care plan provided today and agrees to view in MyChart. Active MyChart status and patient understanding of how to access instructions and care plan via MyChart confirmed with patient.     The patient has been provided with contact information for the care management team and has been advised to call with any health related questions or concerns.   Please call the care guide team at [phone] if you need to cancel or reschedule your appointment.   Please call the Suicide and Crisis Lifeline: 988 call the [place]  National Suicide Prevention Lifeline: (705) 102-7779 or TTY: (740) 416-8284 TTY (774) 446-0266) to talk to a trained counselor call 1-800-273-TALK (toll free, 24 hour hotline) if you are experiencing a Mental Health or Behavioral Health Crisis or need someone to talk to.  Arvin Seip RN, BSN, CCM [employer], Population Health Case Manager Phone: [phone]

## 2023-11-28 ENCOUNTER — Ambulatory Visit: Payer: Self-pay

## 2023-11-29 LAB — AEROBIC/ANAEROBIC CULTURE W GRAM STAIN (SURGICAL/DEEP WOUND)

## 2023-12-01 ENCOUNTER — Ambulatory Visit: Admitting: Physician Assistant

## 2023-12-01 ENCOUNTER — Encounter: Payer: Self-pay | Admitting: Physician Assistant

## 2023-12-01 VITALS — BP 106/66 | HR 79

## 2023-12-01 DIAGNOSIS — L732 Hidradenitis suppurativa: Secondary | ICD-10-CM | POA: Diagnosis not present

## 2023-12-01 DIAGNOSIS — A630 Anogenital (venereal) warts: Secondary | ICD-10-CM

## 2023-12-01 NOTE — Patient Instructions (Addendum)

## 2023-12-01 NOTE — Progress Notes (Signed)
   Follow-Up Visit   Subjective  Logan Crawford is a 48 y.o. male ESTABLISHED PATIENT who presents for the following: Follow up of condyloma.  Patient present today for follow up visit for condyloma. Patient was last evaluated on 11/03/23. At this visit patient received cryotherapy . Patient reports sxs are better. Patient denies medication changes.  Recently underwent surgery for a boil in his groin. Has similar acne bumps in the inguinal region. Has had similar issues in years past.   The following portions of the chart were reviewed this encounter and updated as appropriate: medications, allergies, medical history  Review of Systems:  No other skin or systemic complaints except as noted in HPI or Assessment and Plan.  Objective  Well appearing patient in no apparent distress; mood and affect are within normal limits.    A focused examination was performed of the following areas: Face, axillae, groin, penis and scrotum.   Relevant exam findings are noted in the Assessment and Plan.    Assessment & Plan   HIDRADENITIS SUPPURATIVA Exam: open comedones and scars    Hidradenitis Suppurativa is a chronic; persistent; non-curable, but treatable condition due to abnormal inflamed sweat glands in the body folds (axilla, inframammary, groin, medial thighs), causing recurrent painful draining cysts and scarring. It can be associated with severe scarring acne and cysts; also abscesses and scarring of scalp. The goal is control and prevention of flares, as it is not curable. Scars are permanent and can be thickened. Treatment may include daily use of topical medication and oral antibiotics.  Oral isotretinoin may also be helpful.  For some cases, Humira or Cosentyx (biologic injections) may be prescribed to decrease the inflammatory process and prevent flares.  When indicated, inflamed cysts may also be treated surgically.  Treatment Plan: - Dial soap and BP wash daily    CONDYLOMA -  RESLOVED  -  no further treatment warranted   CONDYLOMA   HIDRADENITIS SUPPURATIVA    Return if symptoms worsen or fail to improve.  I, Doyce Pan, CMA, am acting as scribe for Marquetta Weiskopf K, PA-C.   Documentation: I have reviewed the above documentation for accuracy and completeness, and I agree with the above.  Jarom Govan K, PA-C

## 2023-12-02 ENCOUNTER — Encounter: Payer: Self-pay | Admitting: Nurse Practitioner

## 2023-12-02 ENCOUNTER — Ambulatory Visit: Payer: Self-pay | Admitting: Nurse Practitioner

## 2023-12-02 VITALS — BP 128/74 | HR 85 | Ht 72.0 in | Wt 240.0 lb

## 2023-12-02 DIAGNOSIS — Z720 Tobacco use: Secondary | ICD-10-CM

## 2023-12-02 DIAGNOSIS — Z951 Presence of aortocoronary bypass graft: Secondary | ICD-10-CM

## 2023-12-02 DIAGNOSIS — E782 Mixed hyperlipidemia: Secondary | ICD-10-CM

## 2023-12-02 DIAGNOSIS — Z23 Encounter for immunization: Secondary | ICD-10-CM

## 2023-12-02 DIAGNOSIS — Z09 Encounter for follow-up examination after completed treatment for conditions other than malignant neoplasm: Secondary | ICD-10-CM

## 2023-12-02 DIAGNOSIS — N1831 Chronic kidney disease, stage 3a: Secondary | ICD-10-CM

## 2023-12-02 DIAGNOSIS — I200 Unstable angina: Secondary | ICD-10-CM

## 2023-12-02 DIAGNOSIS — L03314 Cellulitis of groin: Secondary | ICD-10-CM

## 2023-12-02 DIAGNOSIS — I5032 Chronic diastolic (congestive) heart failure: Secondary | ICD-10-CM | POA: Diagnosis not present

## 2023-12-02 DIAGNOSIS — B351 Tinea unguium: Secondary | ICD-10-CM

## 2023-12-02 MED ORDER — EZETIMIBE 10 MG PO TABS
10.0000 mg | ORAL_TABLET | Freq: Every day | ORAL | 3 refills | Status: AC
Start: 2023-12-02 — End: 2024-12-01

## 2023-12-02 NOTE — Assessment & Plan Note (Signed)
 Lab Results  Component Value Date   CHOL 106 09/07/2023   HDL 39 (L) 09/07/2023   LDLCALC 40 09/07/2023   TRIG 158 (H) 09/07/2023   CHOLHDL 2.7 09/07/2023   LDL cholesterol well controlled at 40. Triglycerides slightly elevated at 158. Ezetimibe  (Zetia ) recently ran out.   - Refill ezetimibe  (Zetia ) 10 mg daily, continue Crestor  40 mg daily - Encourage lifestyle modifications including reducing simple carbohydrates and saturated fats, and increasing physical activity.

## 2023-12-02 NOTE — Assessment & Plan Note (Signed)
 Unstable angina status post coronary stent placement Recently hospitalized for unstable angina, underwent cardiac catheterization with placement of two new stents and reopening of three previously closed stents. Reports significant improvement post-procedure. - Continue aspirin  81 mg daily and clopidogrel  75 mg and Crestor  40 mg daily  for CAD. Continue nitro as needed - Follow up with cardiology on October 20th.

## 2023-12-02 NOTE — Assessment & Plan Note (Signed)
 Right groin soft tissue infection, post-procedure Developed cellulitis or cyst-like infection in the right groin post-cardiac procedure. Treated with incision and drainage, followed by Bactrim  for 5 days. Wound is healing with daily dressing changes. - Continue daily dressing changes with Vashe solution. Follow-up with general surgery as planned

## 2023-12-02 NOTE — Patient Instructions (Signed)
 1. Need for immunization against influenza (Primary)  - Flu vaccine trivalent PF, 6mos and older(Flulaval,Afluria,Fluarix,Fluzone)  2. Pain of toe, unspecified laterality  - Ambulatory referral to Podiatry   It is important that you exercise regularly at least 30 minutes 5 times a week as tolerated  Think about what you will eat, plan ahead. Choose  clean, green, fresh or frozen over canned, processed or packaged foods which are more sugary, salty and fatty. 70 to 75% of food eaten should be vegetables and fruit. Three meals at set times with snacks allowed between meals, but they must be fruit or vegetables. Aim to eat over a 12 hour period , example 7 am to 7 pm, and STOP after  your last meal of the day. Drink water ,generally about 64 ounces per day, no other drink is as healthy. Fruit juice is best enjoyed in a healthy way, by EATING the fruit.  Thanks for choosing [facility] we consider it a privelige to serve you.

## 2023-12-02 NOTE — Progress Notes (Signed)
 Established Patient Office Visit  Subjective:  Patient ID: Logan Crawford, male    DOB: January 23, 1976  Age: 48 y.o. MRN: [id number]  CC:  Chief Complaint  Patient presents with   Hospitalization Follow-up    Discuss stopping Jardiance     HPI    Discussed the use of AI scribe software for clinical note transcription with the patient, who gave verbal consent to proceed.  History of Present Illness Logan Crawford is a 48 year old male  has a past medical history of (HFpEF) heart failure with preserved ejection fraction (HCC), Anginal pain, Aortic atherosclerosis, Bilateral pleural effusion (02/20/2022), CAD (coronary artery disease), CKD (chronic kidney disease), stage III (HCC), Complete heart block (HCC) (02/19/2022), Complication of anesthesia (02/19/2022), COPD (chronic obstructive pulmonary disease) (HCC), COVID-19 virus infection (11/25/2019), Genital warts, GERD (gastroesophageal reflux disease), Hiatal hernia, History of 2019 novel coronavirus disease (COVID-19) (11/25/2019), History of left atrial appendage closure (02/19/2022), Hyperlipidemia, Hypertension, Ischemic cardiomyopathy, Leukocytosis, Long term current use of clopidogrel , Long-term use of aspirin  therapy, 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  who presents for hospital discharge follow-up after treatment for unstable angina.  He was hospitalized from September 26 to October 1 for unstable angina Patient underwent successful catheterization with stent placement x 3 and angioplasty x 2-3   He feels significantly better post-procedure. he continues to take aspirin  81 mg daily, Plavix  75 mg  daily, and rosuvastatin . He has not started fenofibrate  for hypertriglyceridemia,that was previously prescribed.   During the hospitalization, he was found to  an infection in the right groin area, initially thought to be cellulitis but later considered a cyst by a dermatologist. He was treated with incision and drainage, was treated with IV antibiotics followed by a course of Bactrim  for 5 days.  he has completed the antibiotic course. The wound is healing, and he no longer needs to pack it with gauze. Was asked to inquire if he needs to stop Jardiance  which was suspected to contribute to the groin infections.  He has a history of congestive heart failure and continues on aldactone ,, metoprolol , and losartan , isosorbide  mononitrate Lasix  was held at discharge   Recent labs done at [facility] showed stable blood counts and kidney function, with eGFR at 43, and creatinine was 1.89.  He has quit smoking since his recent hospitalization and is currently using a nicotine  patch. He has been engaging in physical activity at the gym and is making dietary changes, including eating more grilled chicken and fish, to manage his cholesterol levels.  He reports new onset pain in his big toes, which he describes as having a 'fungus or something' under the nails, a problem he has had for years but which has recently become painful.  No chest pain or shortness of breath.  Assessment and Plan Assessment & Plan       Past Medical History:  Diagnosis Date   (HFpEF) heart failure with preserved ejection fraction (HCC)    a.) TTE 11/26/19: EF 50-55, G1DD; b.) TTE 01/24/20: EF 50, LVH, mid-apical antsep HK, G1DD; c.) TTE 05/18/21: EF 50-55, antsep/sep/apical HK, LVH, mild MR, AoV calc; d.) TTE 07/16/21: EF 60-65, LVH, G2DD, mild MD; e.) TTE 02/18/22: EF 55, sep/inf HK, LVH, G1DD, mild MR; f.) TTE 03/19/22: EF  65-70, antsep/periapical HK, LVH, G1DD, mild MR, AoV calc; g.) TTE 02/12/2023: EF 55-60, LVH, G2DD,  mild MR   Anginal pain    Aortic atherosclerosis    Bilateral pleural effusion 02/20/2022   a.) s/p CABG and LAA closure (L > R) --> progressive during admission --> ultimately required LEFT thoracentesis on 03/10/2022 (750 cc serous yield)   CAD (coronary artery disease)    a.)PCI 01/24/20: 90 mLAD (2.75x22 Res Onyx); b.)PCI 05/20/21: 85 mLAD (2.75x24 + 2.5x8 Onyx); c.)LHC 11/18/21: 80 ISR + 60 mLAD - CVTS vs stent POBA; d.)LHC 02/13/22: 70 ISR p-mLAD, 80/50 ISR mLAD, 99 ISR dLAD, 80 jailed D2, diff HG RCA Dz; e.)3v CABG 02/19/22; f.)LHC 02/03/23: 30 LM, 70 p-mLAD, 80/70 mLAD, 99 dLAD, 80 D2, 70/70 pRCA, 70/95 mRCA, 40 dRCA, 60 RV branch, 60 RPDA, 100 SVG-LAD - Rx mgmt   CKD (chronic kidney disease), stage III (HCC)    Complete heart block (HCC) 02/19/2022   a.) during CABG/LAA closure --> hyperkalemic CHB in OR before heart surgery had functionally begun (not on bypass yet); etiology unknown; ?? due to medication administration or contaminant given fairly normal preop renal function (Cr 1.3). Persisted post-bypass event with extensive dialysis equivalent via CPB pump --> Tx'd with dantrolene    Complication of anesthesia 02/19/2022   a.) perioperative HYPERkalemia - concern for atypical MH initially - Tx'd intraoperaitvely with dantrolene . Preop K+ 4.6 and CR 1.3 --> K+ peaked at 7.8 intra/postop --> rec'd furosemide  + insulin  + calcium  + extended CBP (remained on pump at least 1 hour after procedure). Consult with MH society determined unlikely MH (see Argwala, MD note 02/22/2022). No confirmatory CHCT testing.   COPD (chronic obstructive pulmonary disease) (HCC)    COVID-19 virus infection 11/25/2019   Genital warts    GERD (gastroesophageal reflux disease)    Hiatal hernia    History of 2019 novel coronavirus disease (COVID-19) 11/25/2019   History of left atrial appendage closure 02/19/2022   a.) performed simultaneously with cardiac revascularization (CABG) procedure on 02/19/2022    Hyperlipidemia    Hypertension    Ischemic cardiomyopathy    a.) TTE 11/26/19: EF 50-55; b.)TTE 01/24/20: EF 50; c.)LHC 01/24/20: EF 45-50; d.)MV 11/29/20: EF 43; e.)TTE 05/18/21: EF 50-55; f.)LHC 05/20/21: EF 50-55; g.)TTE 07/16/21: EF 60-65; h.) TTE 11/16/21: EF 60-65; i.) LHC 11/18/21: EF 55-65; j.) TTE 02/12/22: EF 60-65; k.) TTE 02/18/22: EF 55; l.) TTE 03/19/22: EF 65-70; m.) TTE 05/01/22: EF 55; n.) MV 01/27/23: EF 45-50; o.) TTE 02/12/2023: EF 55-60   Leukocytosis    Long term current use of clopidogrel     Long-term use of aspirin  therapy    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   a.) surgical PCR (+) 02/15/2022 prior to CABG   NSTEMI (non-ST elevated myocardial infarction) (HCC) 01/23/2020   a.) troponins were trended: 3520 --> 2990 ng/L; b.) LHC/PCI 01/24/2020: 60% o-pRCA, 40% p-mRCA, 90% mLAD (2.75 x 22 mm Resolute Onyx DES), 40% dLAD   NSTEMI (non-ST elevated myocardial infarction) (HCC) 05/18/2021   a.) troponins were trended: 101 --> 110 --> 175 ng/L; b.) LHC/PCI 05/20/2021: 40% dLAD, 40% p-mRCA, 50% o-pRCA, 40% RPDA, 30% oLM, 85% mLAD with distal edge dissection (overlapping 2.75 x 34 mm and 2.5 x 8 mm Onyx Frontier DES), 60% D2   OSA on CPAP    Polysubstance abuse (HCC)    a.) cocaine, THC, opiates, tobacco   Postoperative cardiogenic shock 02/19/2022   a.) required vasopressor/ionotropic agent support + IABP   Recurrent cellulitis  of thigh    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 Consult with MH society determined MH unlikely (see Argwala, MD note 02/22/2022); b.) c/b postop cardiogenic shock req vasopressor/inotrophic support + IABP; c.) c/b dev of BILATERAL pleural effusions (L > R) req LEFT thoracentesis (750 cc yield)   Scrotal lesion (sebaceous cyst)    Tobacco use 11/25/2019    Past Surgical History:  Procedure Laterality Date    CORONARY ARTERY BYPASS GRAFT N/A 02/19/2022   Procedure: CORONARY ARTERY BYPASS GRAFTING (CABG) X THREE, USING ENDOSCOPICALLY HARVESTED RIGHT GREATER SAPHENOUS VEIN  AND LEFT ATRIAL APPENDAGE CLIPPING;  Surgeon: Murriel Toribio DEL, MD;  Location: [facility] OR;  Service: Open Heart Surgery;  Laterality: N/A;   CORONARY IMAGING/OCT N/A 11/20/2023   Procedure: CORONARY IMAGING/OCT;  Surgeon: Darron Deatrice LABOR, MD;  Location: [facility];  Service: Cardiovascular;  Laterality: N/A;   CORONARY STENT INTERVENTION N/A 01/24/2020   Procedure: CORONARY STENT INTERVENTION;  Surgeon: Darron Deatrice LABOR, MD;  Location: [facility];  Service: Cardiovascular;  Laterality: N/A;   CORONARY STENT INTERVENTION N/A 05/20/2021   Procedure: CORONARY STENT INTERVENTION;  Surgeon: Darron Deatrice LABOR, MD;  Location: [facility];  Service: Cardiovascular;  Laterality: N/A;   CORONARY STENT INTERVENTION N/A 11/20/2023   Procedure: CORONARY STENT INTERVENTION;  Surgeon: Darron Deatrice LABOR, MD;  Location: [facility];  Service: Cardiovascular;  Laterality: N/A;   CORONARY ULTRASOUND/IVUS N/A 05/20/2021   Procedure: Intravascular Ultrasound/IVUS;  Surgeon: Darron Deatrice LABOR, MD;  Location: [facility];  Service: Cardiovascular;  Laterality: N/A;   IRRIGATION AND DEBRIDEMENT ABSCESS Right 11/24/2023   Procedure: IRRIGATION AND DEBRIDEMENT ABSCESS;  Surgeon: Rodolph Romano, MD;  Location: [facility];  Service: General;  Laterality: Right;  right groin   IRRIGATION AND DEBRIDEMENT KNEE Left    LEFT ATRIAL APPENDAGE OCCLUSION Left 02/19/2022   Procedure: LEFT ATRIAL APPENDAGE OCCLUSION; Location: [facility]; Surgeon: Toribio Murriel, MD   LEFT HEART CATH AND CORONARY ANGIOGRAPHY N/A 01/24/2020   Procedure: LEFT HEART CATH AND CORONARY ANGIOGRAPHY;  Surgeon: Darron Deatrice LABOR, MD;  Location: [facility];  Service: Cardiovascular;  Laterality: N/A;   LEFT HEART CATH AND CORONARY ANGIOGRAPHY  N/A 11/18/2021   Procedure: LEFT HEART CATH AND CORONARY ANGIOGRAPHY;  Surgeon: Darron Deatrice LABOR, MD;  Location: [facility];  Service: Cardiovascular;  Laterality: N/A;   LEFT HEART CATH AND CORONARY ANGIOGRAPHY N/A 02/13/2022   Procedure: LEFT HEART CATH AND CORONARY ANGIOGRAPHY;  Surgeon: Anner Alm ORN, MD;  Location: [facility];  Service: Cardiovascular;  Laterality: N/A;   LEFT HEART CATH AND CORS/GRAFTS ANGIOGRAPHY N/A 05/20/2021   Procedure: LEFT HEART CATH AND CORS/GRAFTS ANGIOGRAPHY;  Surgeon: Darron Deatrice LABOR, MD;  Location: [facility];  Service: Cardiovascular;  Laterality: N/A;   LEFT HEART CATH AND CORS/GRAFTS ANGIOGRAPHY N/A 02/03/2023   Procedure: LEFT HEART CATH AND CORS/GRAFTS ANGIOGRAPHY;  Surgeon: Mady Bruckner, MD;  Location: [facility];  Service: Cardiovascular;  Laterality: N/A;   LEFT HEART CATH AND CORS/GRAFTS ANGIOGRAPHY Left 11/20/2023   Procedure: LEFT HEART CATH AND CORS/GRAFTS ANGIOGRAPHY;  Surgeon: Darron Deatrice LABOR, MD;  Location: [facility];  Service: Cardiovascular;  Laterality: Left;   SCROTAL EXPLORATION N/A 03/06/2023   Procedure: EXCISION OF SCROTUM LESION, SEBACEOUS CYST;  Surgeon: Francisca Redell BROCKS, MD;  Location: [facility];  Service: Urology;  Laterality: N/A;   TYMPANOSTOMY TUBE PLACEMENT Bilateral     Family History  Problem Relation Age of Onset   Coronary artery disease Mother    Arrhythmia Mother        Pacemaker placed in 2021   Heart failure Mother    Breast cancer Mother    Coronary artery disease Father    Colon cancer Father    Colon polyps Father     Social History   Socioeconomic History   Marital status: Single    Spouse name: Not on file   Number of children: 3   Years of education: Not on file   Highest education level: Not on file  Occupational History   Not on file  Tobacco Use   Smoking status: Former    Current packs/day: 1.00    Average packs/day: 1 pack/day for  10.8 years (10.8 ttl pk-yrs)    Types: Cigarettes    Start date: 02/12/2013    Passive exposure: Past   Smokeless tobacco: Never   Tobacco comments:    2-3 cigarettes daily- 08/06/2022        1/2 pack daily as of 01/2023  Vaping Use   Vaping status: Never Used  Substance and Sexual Activity   Alcohol use: Yes    Alcohol/week: 1.0 standard drink of alcohol    Types: 1 Shots of liquor per week    Comment: socially   Drug use: Yes    Comment: states takes gummies occasionally   Sexual activity: Yes  Other Topics Concern   Not on file  Social History Narrative   Lives with his uncle    Social Drivers of Corporate investment banker Strain: Not on file  Food Insecurity: No Food Insecurity (11/20/2023)   Hunger Vital Sign    Worried About Running Out of Food in the Last Year: Never true    Ran Out of Food in the Last Year: Never true  Transportation Needs: No Transportation Needs (11/20/2023)   PRAPARE - Administrator, Civil Service (Medical): No    Lack of Transportation (Non-Medical): No  Physical Activity: Not on file  Stress: Not on file  Social Connections: Unknown (08/05/2022)   Received from [facility]   Social Network    Social Network: Not on file  Intimate Partner Violence: Not At Risk (11/20/2023)   Humiliation, Afraid, Rape, and Kick questionnaire    Fear of Current or Ex-Partner: No    Emotionally Abused: No    Physically Abused: No    Sexually Abused: No    Outpatient Medications Prior to Visit  Medication Sig Dispense Refill   acetaminophen  (TYLENOL ) 325 MG tablet Take 2 tablets (650 mg total) by mouth every 4 (four) hours as needed for headache or mild pain (pain score 1-3). 30 tablet 0   aspirin  EC 81 MG tablet Take 1 tablet (81 mg total) by mouth daily. Swallow whole. 90 tablet 3   clopidogrel  (PLAVIX ) 75 MG tablet Take 1 tablet (75 mg total) by mouth daily. 90 tablet 3   empagliflozin  (JARDIANCE ) 10 MG TABS tablet Take 1 tablet (10 mg total)  by mouth daily. 90 tablet 3   ezetimibe  (ZETIA ) 10 MG tablet Take 1 tablet (10 mg total) by mouth daily. 90 tablet 3   famotidine  (PEPCID ) 20 MG tablet Take 1 tablet (20 mg total) by mouth at bedtime as needed for heartburn or indigestion. 60 tablet 0   Fluticasone -Umeclidin-Vilant (  TRELEGY ELLIPTA ) 100-62.5-25 MCG/ACT AEPB Inhale 1 puff into the lungs daily. 60 each 11   isosorbide  mononitrate (IMDUR ) 30 MG 24 hr tablet Take 1 tablet (30 mg total) by mouth daily. 30 tablet 11   losartan  (COZAAR ) 25 MG tablet Take 1 tablet (25 mg total) by mouth daily. 90 tablet 1   metoprolol  succinate (TOPROL -XL) 25 MG 24 hr tablet Take 1 tablet (25 mg total) by mouth daily. 90 tablet 1   nicotine  (NICODERM CQ  - DOSED IN MG/24 HOURS) 14 mg/24hr patch Place 1 patch (14 mg total) onto the skin daily. (Patient taking differently: Place 14 mg onto the skin daily as needed (nicotine  dependence).) 42 patch 0   nitroGLYCERIN  (NITROSTAT ) 0.4 MG SL tablet Place 1 tablet (0.4 mg total) under the tongue every 5 (five) minutes as needed for chest pain (if chest pain not resolved after 3 doses, please call 911). 25 tablet PRN   NON FORMULARY Pt uses a bi-pap nightly     pantoprazole  (PROTONIX ) 40 MG tablet Take 1 tablet (40 mg total) by mouth daily. 90 tablet 0   polyethylene glycol (MIRALAX ) 17 g packet Take 17 g by mouth daily as needed. 30 each 1   rosuvastatin  (CRESTOR ) 40 MG tablet Take 1 tablet (40 mg total) by mouth daily. 90 tablet 3   spironolactone  (ALDACTONE ) 25 MG tablet Take 1 tablet (25 mg total) by mouth daily. 90 tablet 3   VENTOLIN  HFA 108 (90 Base) MCG/ACT inhaler Inhale 1-2 puffs into the lungs every 6 (six) hours as needed for wheezing or shortness of breath. 18 each 3   fenofibrate  (TRICOR ) 48 MG tablet Take 1 tablet (48 mg total) by mouth daily. (Patient not taking: Reported on 12/02/2023) 90 tablet 1   furosemide  (LASIX ) 40 MG tablet Take 1 tablet (40 mg total) by mouth daily. (Patient not taking: Reported  on 12/02/2023) 90 tablet 3   hydrocortisone  (ANUSOL -HC) 25 MG suppository Place 1 suppository (25 mg total) rectally 2 (two) times daily. (Patient not taking: Reported on 12/02/2023) 12 suppository 0   oxyCODONE  (OXY IR/ROXICODONE ) 5 MG immediate release tablet Take 1 tablet (5 mg total) by mouth every 6 (six) hours as needed for severe pain (pain score 7-10). (Patient not taking: Reported on 12/02/2023) 6 tablet 0   No facility-administered medications prior to visit.    Allergies  Allergen Reactions   Bee Pollen     Patient states he is not allergic to bees but has seasonal allergies to pollen    ROS Review of Systems  Constitutional:  Negative for appetite change, chills, fatigue and fever.  HENT:  Negative for congestion, postnasal drip, rhinorrhea and sneezing.   Respiratory:  Negative for cough, shortness of breath and wheezing.   Cardiovascular:  Negative for chest pain, palpitations and leg swelling.  Gastrointestinal:  Negative for abdominal pain, constipation, nausea and vomiting.  Genitourinary:  Negative for difficulty urinating, dysuria, flank pain and frequency.  Musculoskeletal:  Negative for arthralgias, back pain, joint swelling and myalgias.  Skin:  Positive for wound. Negative for color change, pallor and rash.  Neurological:  Negative for dizziness, facial asymmetry, weakness, numbness and headaches.  Psychiatric/Behavioral:  Negative for behavioral problems, confusion, self-injury and suicidal ideas.       Objective:    Physical Exam Vitals and nursing note reviewed.  Constitutional:      General: He is not in acute distress.    Appearance: Normal appearance. He is obese. He is not ill-appearing, toxic-appearing or  diaphoretic.  Eyes:     General: No scleral icterus.       Right eye: No discharge.        Left eye: No discharge.     Extraocular Movements: Extraocular movements intact.     Conjunctiva/sclera: Conjunctivae normal.  Cardiovascular:     Rate and  Rhythm: Normal rate and regular rhythm.     Pulses: Normal pulses.     Heart sounds: Normal heart sounds. No murmur heard.    No friction rub. No gallop.  Pulmonary:     Effort: Pulmonary effort is normal. No respiratory distress.     Breath sounds: Normal breath sounds. No stridor. No wheezing, rhonchi or rales.  Chest:     Chest wall: No tenderness.  Abdominal:     General: There is no distension.     Palpations: Abdomen is soft.     Tenderness: There is no abdominal tenderness. There is no right CVA tenderness, left CVA tenderness or guarding.  Musculoskeletal:        General: No swelling, tenderness, deformity or signs of injury.     Right lower leg: No edema.     Left lower leg: No edema.  Skin:    General: Skin is warm and dry.     Capillary Refill: Capillary refill takes less than 2 seconds.     Coloration: Skin is not jaundiced or pale.     Findings: No bruising, erythema or lesion.     Comments: Wound on right groin healing well, no redness,or drainage noted   Bilateral great toes nails appears thickened  Neurological:     Mental Status: He is alert and oriented to person, place, and time.     Motor: No weakness.     Coordination: Coordination normal.     Gait: Gait normal.  Psychiatric:        Mood and Affect: Mood normal.        Behavior: Behavior normal.        Thought Content: Thought content normal.        Judgment: Judgment normal.     BP 128/74   Pulse 85   Ht 6' (1.829 m)   Wt 240 lb (108.9 kg)   SpO2 92%   BMI 32.55 kg/m  Wt Readings from Last 3 Encounters:  12/02/23 240 lb (108.9 kg)  11/25/23 248 lb 14.4 oz (112.9 kg)  11/18/23 242 lb (109.8 kg)    No results found for: TSH Lab Results  Component Value Date   WBC 8.6 11/25/2023   HGB 14.1 11/25/2023   HCT 43.6 11/25/2023   MCV 84.5 11/25/2023   PLT 331 11/25/2023   Lab Results  Component Value Date   NA 137 11/21/2023   K 3.8 11/21/2023   CO2 20 (L) 11/21/2023   GLUCOSE 97  11/21/2023   BUN 13 11/21/2023   CREATININE 1.21 11/21/2023   BILITOT 0.7 07/17/2023   ALKPHOS 82 07/17/2023   AST 30 07/17/2023   ALT 25 07/17/2023   PROT 7.4 07/17/2023   ALBUMIN  3.8 07/17/2023   CALCIUM  8.3 (L) 11/21/2023   ANIONGAP 11 11/21/2023   EGFR 54 (L) 11/18/2023   Lab Results  Component Value Date   CHOL 106 09/07/2023   Lab Results  Component Value Date   HDL 39 (L) 09/07/2023   Lab Results  Component Value Date   LDLCALC 40 09/07/2023   Lab Results  Component Value Date   TRIG 158 (H) 09/07/2023  Lab Results  Component Value Date   CHOLHDL 2.7 09/07/2023   Lab Results  Component Value Date   HGBA1C 5.2 04/14/2022      Assessment & Plan:   Problem List Items Addressed This Visit       Cardiovascular and Mediastinum   Unstable angina (HCC) - Primary   Unstable angina status post coronary stent placement Recently hospitalized for unstable angina, underwent cardiac catheterization with placement of two new stents and reopening of three previously closed stents. Reports significant improvement post-procedure. - Continue aspirin  81 mg daily and clopidogrel  75 mg and Crestor  40 mg daily  for CAD. Continue nitro as needed - Follow up with cardiology on October 20th.       Chronic diastolic CHF (congestive heart failure) (HCC)    CHF management includes aldactone , tracleer, and losartan . Lasix  was held due to recent hospitalization. Jardiance  was discontinued due to groin infections, and cardiology will be consulted for alternative management. - Continue aldactone  25 mg daily, metoprolo 25 mg daily , and losartan  25 mg daily, isosorbide  mononitrate 30 mg.  Since blood pressure is currently well-controlled - Hold Lasix   and jardiance  until cardiology advises on resumption. - Consult cardiology regarding alternative to Jardiance .         Musculoskeletal and Integument   Onychomycosis of great toe   Onychomycosis with toe pain Chronic  onychomycosis with recent onset of pain in both big toes. - Refer to podiatry for evaluation and management of onychomycosis and toe pain.      Relevant Orders   Ambulatory referral to Podiatry     Genitourinary   Stage 3a chronic kidney disease [facility])   Lab Results  Component Value Date   NA 137 11/21/2023   K 3.8 11/21/2023   CO2 20 (L) 11/21/2023   GLUCOSE 97 11/21/2023   BUN 13 11/21/2023   CREATININE 1.21 11/21/2023   CALCIUM  8.3 (L) 11/21/2023   EGFR 54 (L) 11/18/2023   GFRNONAA >60 11/21/2023   CKD stage 3 with recent labs showing eGFR of 43 and creatinine of 1.89. - Stay well hydrated. - Avoid NSAIDs such as Aleve and ibuprofen. Continue losartan  25 mg daily         Other   Tobacco use   Tobacco use, currently using nicotine  patch Recently quit smoking and is using a nicotine  patch. Smoking cessation is crucial for cardiovascular and pulmonary health. - Continue nicotine  patch for smoking cessation support.          Hyperlipidemia   Lab Results  Component Value Date   CHOL 106 09/07/2023   HDL 39 (L) 09/07/2023   LDLCALC 40 09/07/2023   TRIG 158 (H) 09/07/2023   CHOLHDL 2.7 09/07/2023   LDL cholesterol well controlled at 40. Triglycerides slightly elevated at 158. Ezetimibe  (Zetia ) recently ran out.   - Refill ezetimibe  (Zetia ) 10 mg daily, continue Crestor  40 mg daily - Encourage lifestyle modifications including reducing simple carbohydrates and saturated fats, and increasing physical activity.      Hospital discharge follow-up   Hospital chart reviewed, including discharge summary Medications reconciled and reviewed with the patient in detail       Cellulitis of groin, right   Right groin soft tissue infection, post-procedure Developed cellulitis or cyst-like infection in the right groin post-cardiac procedure. Treated with incision and drainage, followed by Bactrim  for 5 days. Wound is healing with daily dressing changes. - Continue daily  dressing changes with Vashe solution. Follow-up with general surgery as planned  Need for immunization against influenza   Relevant Orders   Flu vaccine trivalent PF, 6mos and older(Flulaval,Afluria,Fluarix,Fluzone) (Completed)    No orders of the defined types were placed in this encounter.   Follow-up: No follow-ups on file.    Jishnu Jenniges R Cornelis Kluver, FNP

## 2023-12-02 NOTE — Assessment & Plan Note (Signed)
 Onychomycosis with toe pain Chronic onychomycosis with recent onset of pain in both big toes. - Refer to podiatry for evaluation and management of onychomycosis and toe pain.

## 2023-12-02 NOTE — Assessment & Plan Note (Signed)
 Hospital chart reviewed, including discharge summary Medications reconciled and reviewed with the patient in detail

## 2023-12-02 NOTE — Assessment & Plan Note (Signed)
 Lab Results  Component Value Date   NA 137 11/21/2023   K 3.8 11/21/2023   CO2 20 (L) 11/21/2023   GLUCOSE 97 11/21/2023   BUN 13 11/21/2023   CREATININE 1.21 11/21/2023   CALCIUM  8.3 (L) 11/21/2023   EGFR 54 (L) 11/18/2023   GFRNONAA >60 11/21/2023   CKD stage 3 with recent labs showing eGFR of 43 and creatinine of 1.89. - Stay well hydrated. - Avoid NSAIDs such as Aleve and ibuprofen. Continue losartan  25 mg daily

## 2023-12-02 NOTE — Assessment & Plan Note (Addendum)
 Tobacco use, currently using nicotine  patch Recently quit smoking and is using a nicotine  patch. Smoking cessation is crucial for cardiovascular and pulmonary health. - Continue nicotine  patch for smoking cessation support.

## 2023-12-02 NOTE — Assessment & Plan Note (Signed)
  CHF management includes aldactone , tracleer, and losartan . Lasix  was held due to recent hospitalization. Jardiance  was discontinued due to groin infections, and cardiology will be consulted for alternative management. - Continue aldactone  25 mg daily, metoprolo 25 mg daily , and losartan  25 mg daily, isosorbide  mononitrate 30 mg.  Since blood pressure is currently well-controlled - Hold Lasix   and jardiance  until cardiology advises on resumption. - Consult cardiology regarding alternative to Jardiance .

## 2023-12-11 NOTE — Progress Notes (Unsigned)
 Cardiology Clinic Note   Date: 12/14/2023 ID: Hamza, Empson 11/17/1975, MRN [id number]  Primary Cardiologist:  Redell Cave, MD  Chief Complaint   Logan Crawford is a 48 y.o. male who presents to the clinic today for hospital follow up.   Patient Profile   Logan Crawford is followed by Dr. Cave for the history outlined below.       Past medical history significant for: CAD. LHC 01/24/2020 (NSTEMI): Ostial to proximal RCA 60%.  Proximal to mid RCA 40%.  Mid LAD 90%.  PCI with DES 2.75 x 22 mm to mid LAD.  Mildly reduced LV systolic function with EF 45% with moderate to severe anteroapical hypokinesis.  Moderately elevated LVEDP. LHC 05/20/2021 (NSTEMI): Distal LAD 40%.  Proximal to mid RCA 40%.  Ostial proximal RCA 50%.  RPDA 40%.  Ostial LM 30%.  Mid LAD 85% distal edge in-stent restenosis with extensive plaque distal to the stent involving the whole mid segment.  D2 60%.  PCI with DES 2.75 x 34 mm to mid LAD.  Balloon angioplasty to ostial D2. LHC 11/18/2021 (unstable angina): Ostial 30%.  Distal LAD 40%.  Ostial to proximal RCA 50%.  Proximal mid RCA 40%.  D2 30%.  RPDA 40%.  Mid LAD #1 80%, #3 60%, mid RCA 70%.  Patent overlapping LAD stents with evidence of significant in-stent restenosis in the proximal segment with borderline stenosis distal to the stent.  Recommended CABG versus balloon angioplasty given patient's poor compliance with medical therapy and continued cocaine use. LHC 02/13/2022: Proximal and distal LAD stent: Proximal to mid LAD 70%, jailed D2 80%.  Mid LAD #1 80%, #2 50%, distal LAD had distal stent edge 99%.  Mid RCA #1 70%, #2 95%.  RVEF 60%.  RPDA 60%.  Recommend transfer to [facility] for CTS consult. CABG x 3 02/19/2022: LIMA to diagonal, SVG to LAD, SVG to RCA.  LAA closure. Nuclear stress test 01/27/2023: Abnormal myocardial perfusion test, intermediate risk study.  Moderate in size, moderate severity reversible defect involving the apex  concerning for ischemia but cannot exclude an element of artifact. LHC 02/03/2023 (abnormal stress test): Severe two-vessel native CAD with up to 99% in-stent restenosis of the LAD and multifocal RCA disease of up to 95%.  Overall.  Similar to pre-CABG native CAD.  Widely patent LIMA to D2 and SVG to RCA.  Occluded SVG to LAD.  Recommend addition of isosorbide .  If refractory symptoms consider PTCA/PCI of LAD in-stent restenosis. LHC 11/20/2023 (unstable angina): Proximal RCA #1 70%, #2 70%.  Mid RCA #1 40%, #2 100%.  Distal RCA 40%.  Mid LAD #1 80%, #2 70%.  Distal LAD #1 99%, #2 70%, #3 60%.  SVG to LAD chronically occluded.  RPDA 60%.  RV branch 60%.  D2 95%.  Ostial LAD 40%.  Proximal to mid LAD 70%.  PCI with balloon angioplasty proximal to mid LAD, balloon angioplasty to mid LAD #1 and #2.  PCI with DES 2.5 x 30 mid LAD #2 to distal LAD #1, overlapping DES 2.25 x 26 to distal LAD #1 and #2 which also treats mid LAD #2.  Recommendation for increased dose of isosorbide  secondary to extensive coronary spasm. Chronic diastolic heart failure. Echo 11/23/2023: EF 60 to 65%.  No RWMA.  Moderate asymmetric LVH of the septal segment.  Grade I DD.  Normal RV size/function.  Aortic valve sclerosis/calcification without stenosis. Hypertension. Hyperlipidemia. Lipid panel 09/07/2023: LDL 40, HDL 39, TG 158, total 106.  GERD. Emphysema. OSA.  CKD stage IIIa. Tobacco abuse. Polysubstance abuse. Medication nonadherence.  In summary, patient has an extensive cardiac history with multiple PCI and CABG as detailed.  He was admitted to the hospital in October 2021 with progressive weakness, fatigue, dyspnea, cough, diarrhea.  He was found to be COVID-positive.  Troponin peaked at 32.  Echo showed EF 50 to 55%, no RWMA, Grade I DD, normal RV size and function, no valvular abnormalities.  He was readmitted November 2021 with NSTEMI, troponin peaked at 3520.  Echo at that time showed EF 50%, moderate hypokinesis of the  mid apical anteroseptal wall, mild LVH, diastolic dysfunction, normal RV size/function, mild aortic valve sclerosis without stenosis.  He underwent successful PCI with DES to mid LAD.  Lexiscan  October 2022 showed a small region of ischemia in the apical wall MI is overall low risk.    He was admitted to the hospital March 2023 with chest pain, troponin peaked at 125.  UDS positive for cocaine.  Echo showed normal LV/RV function, hypokinesis of the mid to distal anteroseptal/septal and apical region, moderate LVH, Grade I DD, mild MR, aortic valve sclerosis without stenosis.  LHC demonstrated in-stent restenosis as detailed above patient underwent PCI with DES and balloon angioplasty.    He was admitted September 2023 with unstable angina.  He had not been taking his medications.  UDS positive for cocaine.  Echo showed EF 60 to 65%, no RWMA, moderate concentric LVH, Grade I DD, normal RV size/function, trivial MR, aortic valve sclerosis without stenosis.  LHC showed significant in-stent restenosis in the proximal segment of overlapping LAD stents.  IC NTG improved overall appearance of coronary arteries and intervention was deferred secondary to patient's poor compliance and continued cocaine use.  Antianginal therapy was optimized with plan for revascularization if patient remains symptomatic.  He was admitted to the hospital December 2023 with NSTEMI in the setting of continued cocaine use and nonadherence with antiplatelet therapy.  Echo showed normal LV/RV function, no RWMA, mild MR.  LHC showed 2 potential culprit lesion including mid to distal RCA and mid to distal LAD.  He was transferred to [facility] and underwent CABG x 3.  Intraoperative course complicated by hyperkalemia bradycardic arrest prior to initiation of cardiopulmonary bypass with requirement of IABP as well as inotropic and vasopressor support which was continued postoperatively.  Posthospitalization course complicated by left pleural  effusion requiring thoracentesis.    Patient was seen for preop evaluation in November 2024.  He reported occasional chest pain and more dyspnea with minimal exertion over a couple of months.  Patient reported inadvertently not taking metoprolol  and losartan  x 2 months he restarted losartan  but wanted to be sure he should take metoprolol .  He reported using cocaine the month prior.  EKG demonstrated more prominent inverted T waves in lateral leads concerning for possible ischemia.  He had an abnormal, intermediate risk nuclear stress test as detailed above.  He then underwent LHC which demonstrated severe two-vessel native CAD and occluded SVG to LAD as detailed above.  He was started on isosorbide .  Upon follow-up in December 2024 he reported improvement in symptoms since starting isosorbide .  He was cleared for his surgery which was performed on 03/06/2023.  Patient was seen in the office on 05/14/2023 for routine follow-up.  He was doing well at that time.  He was exercising and using his BiPAP regularly.  He was working on quitting smoking and had not used cocaine for several months.  Patient was seen by Dr. Cherrie in advanced heart failure clinic on 06/01/2023 for routine follow-up.  Patient reported postprandial chest pain that was felt to be noncardiac.  He was euvolemic.  Abdominal ultrasound was ordered to evaluate patient's postprandial abdominal pain.  There was no evidence of gallstones and demonstrated findings suspicious for fatty liver.   Patient was last seen in the office by me on 11/18/2023 for routine follow-up.  He reported increased fatigue and episodes of chest pain x 2 weeks.  He had been going to the gym regularly with rare, brief episodes of chest pain.  At the beginning of September he began feeling more fatigued to the point with that he stopped going to the gym.  He then began having episodes of exertional heavy/dull central chest discomfort that eases with rest and associated with  dyspnea.  Episodes occur 3-4 times a week lasting minutes.  He had an episode the day prior after getting out of the shower that lasted a few hours.  He had taken NTG a few times with relief.  He denied using cocaine for at least 17 months.  He underwent LHC with balloon angioplasty and PCI with overlapping DES on 11/20/2023. His hospital admission was complicated by a groin infection requiring I&D of the right groin abscess. Jardiance  was stopped.      History of Present Illness    Today, patient is doing well since hospital discharge. Patient denies shortness of breath, dyspnea on exertion, lower extremity edema, orthopnea or PND. No chest pain, pressure, or tightness. No palpitations. He has returned to the gym and is building back up his exercise tolerance. His groin infection has healed. He did not stop Jardiance , as he wanted to check with us  first. He is interested in doing cardiac rehab again.     ROS: All other systems reviewed and are otherwise negative except as noted in History of Present Illness.  EKGs/Labs Reviewed    EKG Interpretation Date/Time:  Monday December 14 2023 14:59:40 EDT Ventricular Rate:  89 PR Interval:  152 QRS Duration:  78 QT Interval:  396 QTC Calculation: 481 R Axis:   40  Text Interpretation: Normal sinus rhythm Possible Inferior infarct , age undetermined Cannot rule out Anterior infarct , age undetermined T wave abnormality, consider lateral ischemia When compared with ECG of 20-Nov-2023 14:19, No signficant change Confirmed by Loistine Sober [phone]) on 12/14/2023 3:04:44 PM   07/17/2023: ALT 25; AST 30 11/21/2023: BUN 13; Creatinine, Ser 1.21; Potassium 3.8; Sodium 137   11/25/2023: Hemoglobin 14.1; WBC 8.6    Physical Exam    VS:  BP 100/70   Pulse 89   Ht 6' (1.829 m)   Wt 240 lb 6.4 oz (109 kg)   SpO2 98%   BMI 32.60 kg/m  , BMI Body mass index is 32.6 kg/m.  GEN: Well nourished, well developed, in no acute distress. Neck: No JVD or  carotid bruits. Cardiac:  RRR.  No murmur. No rubs or gallops.   Respiratory:  Respirations regular and unlabored. Clear to auscultation without rales, wheezing or rhonchi. GI: Soft, nontender, nondistended. Extremities: Radials/DP/PT 2+ and equal bilaterally. No clubbing or cyanosis. No edema   Skin: Warm and dry, no rash. Neuro: Strength intact.  Assessment & Plan   CAD Extensive history with multiple PCI. CABG x 26 January 2022. Patient presented for pre-op evaluation and EKG showed more prominent T-Wave inversion in lateral leads when compared to prior EKG. In December 2024, he complained of  more dyspnea with activity Lexiscan  showed evidence of ischemia. He underwent LHC which showed severe two-vessel native CAD with up to 99% in-stent restenosis of the LAD and multifocal RCA disease up to 95%.  Patent LIMA to D2 and SVG to RCA, occluded SVG to LAD.  LHC on 9/26 for unstable angina with balloon angioplasty x 2 and PCI with overlapping stent x 2 (as detailed in patient profile). Patient denies any further chest pain or dyspnea since hospital discharge. He is back to gym and working back up his exercise tolerance. He is interested in cardiac rehab.  - Continue aspirin , Plavix , rosuvastatin , Toprol , isosorbide , as needed SL NTG.   Chronic diastolic heart failure Echo 11/23/2023 demonstrated normal LV/RV function, moderate asymmetric LVH, Grade I DD, aortic valve sclerosis without stenosis.  Patient denies lower extremity edema, orthopnea or PND. He continued to take Jardiance , as he was concerned with stopping it without cardiology weighing in. Euvolemic and well compensated on exam. - Continue spironolactone , isosorbide , Toprol  and losartan .   - Stop Jardiance . Will consider restarting it at follow up.    Hypertension BP today 100/70. No dizziness or headaches reported.   -Continue losartan , Toprol , spironolactone .   Hyperlipidemia LDL 40 July 2025, at goal. -Continue rosuvastatin .    Tobacco abuse Patient stopped smoking for several weeks. He has started smoking a couple of cigarettes a day when nicotine  patches ran out. He is encouraged to restart using nicotine  patches so he can completely quit smoking. - Refill nicotine  patches.  - Encouraged complete cessation.    OSA Patient reports inconsistence adherence to BiPAP. He has started using it more regularly.  - Encouraged consistent adherence with BiPAP.  Disposition: Stop Jardiance . Refill nicotine  patches. Return in 3 months or sooner as needed.     Cardiac Rehabilitation Eligibility Assessment  The patient is ready to start cardiac rehabilitation from a cardiac standpoint.        Signed, Barnie HERO. Axzel Rockhill, DNP, NP-C

## 2023-12-14 ENCOUNTER — Ambulatory Visit: Attending: Student | Admitting: Student

## 2023-12-14 ENCOUNTER — Encounter: Payer: Self-pay | Admitting: Student

## 2023-12-14 VITALS — BP 100/70 | HR 89 | Ht 72.0 in | Wt 240.4 lb

## 2023-12-14 DIAGNOSIS — I2510 Atherosclerotic heart disease of native coronary artery without angina pectoris: Secondary | ICD-10-CM | POA: Diagnosis not present

## 2023-12-14 DIAGNOSIS — I10 Essential (primary) hypertension: Secondary | ICD-10-CM | POA: Diagnosis present

## 2023-12-14 DIAGNOSIS — G4733 Obstructive sleep apnea (adult) (pediatric): Secondary | ICD-10-CM | POA: Insufficient documentation

## 2023-12-14 DIAGNOSIS — E785 Hyperlipidemia, unspecified: Secondary | ICD-10-CM | POA: Diagnosis present

## 2023-12-14 DIAGNOSIS — Z720 Tobacco use: Secondary | ICD-10-CM | POA: Insufficient documentation

## 2023-12-14 DIAGNOSIS — I5032 Chronic diastolic (congestive) heart failure: Secondary | ICD-10-CM | POA: Diagnosis not present

## 2023-12-14 MED ORDER — NICOTINE 14 MG/24HR TD PT24: 14.0000 mg | MEDICATED_PATCH | Freq: Every day | TRANSDERMAL | 0 refills | Status: DC

## 2023-12-14 NOTE — Patient Instructions (Signed)
 Medication Instructions:   Your physician recommends that you continue on your current medications as directed. Please refer to the Current Medication list given to you today.    *If you need a refill on your cardiac medications before your next appointment, please call your pharmacy*  Lab Work:  None ordered at this time   If you have labs (blood work) drawn today and your tests are completely normal, you will receive your results only by:  MyChart Message (if you have MyChart) OR  A paper copy in the mail If you have any lab test that is abnormal or we need to change your treatment, we will call you to review the results.  Testing/Procedures:  None ordered at this time   Referrals:  None ordered at this time   Follow-Up:  At [facility], you and your health needs are our priority.  As part of our continuing mission to provide you with exceptional heart care, our providers are all part of one team.  This team includes your primary Cardiologist (physician) and Advanced Practice Providers or APPs (Physician Assistants and Nurse Practitioners) who all work together to provide you with the care you need, when you need it.  Your next appointment:   3 month(s)  Provider:    Redell Cave, MD or Barnie Hila, NP    We recommend signing up for the patient portal called MyChart.  Sign up information is provided on this After Visit Summary.  MyChart is used to connect with patients for Virtual Visits (Telemedicine).  Patients are able to view lab/test results, encounter notes, upcoming appointments, etc.  Non-urgent messages can be sent to your provider as well.   To learn more about what you can do with MyChart, go to [email or web address].

## 2023-12-17 ENCOUNTER — Other Ambulatory Visit: Payer: Self-pay | Admitting: Nurse Practitioner

## 2023-12-17 DIAGNOSIS — K219 Gastro-esophageal reflux disease without esophagitis: Secondary | ICD-10-CM

## 2024-01-05 ENCOUNTER — Encounter

## 2024-01-06 ENCOUNTER — Other Ambulatory Visit: Payer: Self-pay

## 2024-01-08 MED ORDER — ISOSORBIDE MONONITRATE ER 30 MG PO TB24
30.0000 mg | ORAL_TABLET | Freq: Every day | ORAL | 3 refills | Status: AC
Start: 2024-01-08 — End: ?

## 2024-01-25 ENCOUNTER — Telehealth: Payer: Self-pay

## 2024-01-25 NOTE — Telephone Encounter (Signed)
 Copied from CRM #[id number]. Topic: General - Phone/Fax/Address >> Jan 20, 2024 12:55 PM Delon DASEN wrote: [facility] is stating that [facility] is not showing as participating in the [facility] network.    Please advise . KH

## 2024-01-28 ENCOUNTER — Ambulatory Visit: Admitting: Podiatry

## 2024-02-10 ENCOUNTER — Other Ambulatory Visit: Payer: Self-pay | Admitting: Student

## 2024-02-10 ENCOUNTER — Other Ambulatory Visit: Payer: Self-pay | Admitting: Internal Medicine

## 2024-02-10 DIAGNOSIS — Z720 Tobacco use: Secondary | ICD-10-CM

## 2024-03-01 ENCOUNTER — Other Ambulatory Visit (HOSPITAL_COMMUNITY): Payer: Self-pay

## 2024-03-01 ENCOUNTER — Ambulatory Visit (INDEPENDENT_AMBULATORY_CARE_PROVIDER_SITE_OTHER): Payer: Self-pay | Admitting: Nurse Practitioner

## 2024-03-01 ENCOUNTER — Encounter: Payer: Self-pay | Admitting: Nurse Practitioner

## 2024-03-01 VITALS — BP 119/78 | HR 76 | Temp 97.1°F | Wt 239.0 lb

## 2024-03-01 DIAGNOSIS — G4700 Insomnia, unspecified: Secondary | ICD-10-CM | POA: Diagnosis not present

## 2024-03-01 DIAGNOSIS — Z125 Encounter for screening for malignant neoplasm of prostate: Secondary | ICD-10-CM | POA: Diagnosis not present

## 2024-03-01 DIAGNOSIS — I5032 Chronic diastolic (congestive) heart failure: Secondary | ICD-10-CM

## 2024-03-01 DIAGNOSIS — Z87891 Personal history of nicotine dependence: Secondary | ICD-10-CM

## 2024-03-01 DIAGNOSIS — E785 Hyperlipidemia, unspecified: Secondary | ICD-10-CM

## 2024-03-01 DIAGNOSIS — K219 Gastro-esophageal reflux disease without esophagitis: Secondary | ICD-10-CM

## 2024-03-01 DIAGNOSIS — I2510 Atherosclerotic heart disease of native coronary artery without angina pectoris: Secondary | ICD-10-CM

## 2024-03-01 DIAGNOSIS — H6121 Impacted cerumen, right ear: Secondary | ICD-10-CM | POA: Diagnosis not present

## 2024-03-01 DIAGNOSIS — Z0000 Encounter for general adult medical examination without abnormal findings: Secondary | ICD-10-CM | POA: Diagnosis not present

## 2024-03-01 DIAGNOSIS — J438 Other emphysema: Secondary | ICD-10-CM | POA: Diagnosis not present

## 2024-03-01 MED ORDER — HYDROXYZINE PAMOATE 25 MG PO CAPS: 25.0000 mg | ORAL_CAPSULE | Freq: Every evening | ORAL | 0 refills | Status: AC | PRN

## 2024-03-01 MED ORDER — HYDROXYZINE PAMOATE 25 MG PO CAPS
25.0000 mg | ORAL_CAPSULE | Freq: Every evening | ORAL | 0 refills | Status: DC | PRN
  Filled 2024-03-01: qty 30, 30d supply, fill #0

## 2024-03-01 NOTE — Progress Notes (Signed)
 "  Complete physical exam  Patient: Logan Crawford   DOB: 11/06/1975   49 y.o. Male  MRN: [id number]  Subjective:    Chief Complaint  Patient presents with   Annual Exam    Need something to help sleep x 6-8 months.       Discussed the use of AI scribe software for clinical note transcription with the patient, who gave verbal consent to proceed.  History of Present Illness Logan Crawford is a 49 year old male  has a past medical history of (HFpEF) heart failure with preserved ejection fraction (HCC), Anginal pain, Aortic atherosclerosis, Bilateral pleural effusion (02/20/2022), CAD (coronary artery disease), CKD (chronic kidney disease), stage III (HCC), Complete heart block (HCC) (02/19/2022), Complication of anesthesia (02/19/2022), COPD (chronic obstructive pulmonary disease) (HCC), COVID-19 virus infection (11/25/2019), Genital warts, GERD (gastroesophageal reflux disease), Hiatal hernia, History of 2019 novel coronavirus disease (COVID-19) (11/25/2019), History of left atrial appendage closure (02/19/2022), Hyperlipidemia, Hypertension, Ischemic cardiomyopathy, Leukocytosis, Long term current use of clopidogrel , Long-term use of aspirin  therapy, 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 who presents for an annual physical exam.  He is currently on multiple medications for blood pressure management, including spironolactone  25 mg daily, metoprolol  25 mg daily, losartan  25 mg daily, and isosorbide  mononitrate 30 mg daily. He has discontinued Lasix  and is uncertain about his use of Jardiance . For cholesterol, he takes Zetia  10 mg daily and rosuvastatin  40 mg  daily. He uses pantoprazole  40mg  daily for acid reflux and Tums as needed for heartburn.   He uses a Trelegy inhaler one puff daily and an albuterol  inhaler as needed for emphysema. He has chronic shortness of breath but no new shortness of breath, chest pain, or swelling.  He has a history of coronary artery disease with stents placed in September 2025. He experiences occasional chest pain and has been prescribed nitroglycerin , though he mentions he has only one pill left. He also takes Plavix  75 mg daily, aspirin  81 mg daily,    He has sleep apnea and uses a BiPAP machine, which is not working well currently. He has difficulty sleeping and has tried melatonin without success. He mentions using Xanax given by a friend, which helps him sleep but makes it difficult to wake up.  He quit smoking two to three months ago but occasionally uses nicotine  patches. He tries to stay active by going to the gym but has not been feeling well recently. He attempts to maintain a healthy diet by avoiding salty, fatty, and fried foods, though he admits to not always adhering to this.  He experienced a recent illness with severe coughing and was unable to attend his last appointments. No current drug use and no wounds.  He experiences bloating and stomach pain, which he attributes to acid reflux. His stomach sometimes appears distended, and he experiences pain in the epigastric l area.     Assessment & Plan         Most recent fall risk assessment:    12/02/2023    2:29 PM  Fall Risk   Falls in the past year? 0  Number falls in past yr: 0  Injury with Fall? 0   Risk for fall due to : No Fall Risks  Follow up  Falls evaluation completed     Data saved with a previous flowsheet row definition     Most recent depression screenings:    03/01/2024    2:14 PM 11/26/2023    9:39 AM  PHQ 2/9 Scores  PHQ - 2 Score 0 0  PHQ- 9 Score 0         Patient Care Team: Loveta Dellis R, FNP as PCP -  General (Nurse Practitioner) Darliss Rogue, MD as PCP - Cardiology (Cardiology)   Show/hide medication list[1]  Review of Systems  Constitutional:  Negative for appetite change, chills, fatigue and fever.  HENT:  Negative for congestion, postnasal drip, rhinorrhea and sneezing.   Eyes:  Negative for pain, discharge and itching.  Respiratory:  Negative for cough, shortness of breath and wheezing.   Cardiovascular:  Negative for chest pain, palpitations and leg swelling.  Gastrointestinal:  Positive for abdominal pain. Negative for constipation, nausea and vomiting.  Genitourinary:  Negative for difficulty urinating, dysuria, flank pain and frequency.  Musculoskeletal:  Negative for arthralgias, back pain, joint swelling and myalgias.  Skin:  Negative for color change, pallor, rash and wound.  Neurological:  Negative for dizziness, facial asymmetry, weakness, numbness and headaches.  Psychiatric/Behavioral:  Positive for sleep disturbance. Negative for behavioral problems, confusion, self-injury and suicidal ideas.        Objective:     BP 119/78 (BP Location: Left Arm, Patient Position: Sitting, Cuff Size: Normal)   Pulse 76   Temp (!) 97.1 F (36.2 C) (Temporal)   Wt 239 lb (108.4 kg)   SpO2 96%   BMI 32.41 kg/m    Physical Exam Vitals and nursing note reviewed. Exam conducted with a chaperone present.  Constitutional:      General: He is not in acute distress.    Appearance: Normal appearance. He is obese. He is not ill-appearing, toxic-appearing or diaphoretic.  HENT:     Right Ear: Tympanic membrane, ear canal and external ear normal. There is impacted cerumen.     Left Ear: Tympanic membrane, ear canal and external ear normal. There is no impacted cerumen.     Nose: Nose normal. No congestion or rhinorrhea.     Mouth/Throat:     Mouth: Mucous membranes are moist.     Pharynx: Oropharynx is clear. No oropharyngeal exudate or posterior oropharyngeal erythema.   Eyes:     General: No scleral icterus.       Right eye: No discharge.        Left eye: No discharge.     Extraocular Movements: Extraocular movements intact.     Conjunctiva/sclera: Conjunctivae normal.  Neck:     Vascular: No carotid bruit.  Cardiovascular:     Rate and Rhythm: Normal rate and regular rhythm.     Pulses: Normal pulses.     Heart sounds: Normal heart sounds. No murmur heard.    No friction rub. No gallop.  Pulmonary:     Effort: Pulmonary effort is normal. No respiratory distress.     Breath sounds: Normal breath sounds. No stridor. No wheezing, rhonchi or rales.  Chest:     Chest wall: No tenderness.  Abdominal:     General: Bowel sounds are normal. There is no distension.     Palpations: Abdomen is soft. There is no mass.     Tenderness: There is abdominal tenderness. There is no right CVA tenderness, left CVA tenderness, guarding or rebound.     Hernia: No hernia is present.  Comments: Mild mid epigastric tenderness   Musculoskeletal:        General: No swelling, tenderness, deformity or signs of injury.     Cervical back: Normal range of motion and neck supple. No rigidity or tenderness.     Right lower leg: No edema.     Left lower leg: No edema.  Lymphadenopathy:     Cervical: No cervical adenopathy.  Skin:    General: Skin is warm and dry.     Capillary Refill: Capillary refill takes less than 2 seconds.     Coloration: Skin is not jaundiced or pale.     Findings: No bruising, erythema, lesion or rash.  Neurological:     Mental Status: He is alert and oriented to person, place, and time.     Cranial Nerves: No cranial nerve deficit.     Motor: No weakness.     Gait: Gait normal.  Psychiatric:        Mood and Affect: Mood normal.        Behavior: Behavior normal.        Thought Content: Thought content normal.        Judgment: Judgment normal.     No results found for any visits on 03/01/24.     Assessment & Plan:    Routine Health  Maintenance and Physical Exam  Immunization History  Administered Date(s) Administered   Influenza, Seasonal, Injecte, Preservative Fre 12/02/2023   Influenza,inj,Quad PF,6+ Mos 03/01/2022   Pneumococcal Polysaccharide-23 06/12/2016    Health Maintenance  Topic Date Due   COVID-19 Vaccine (1) Never done   Hepatitis B Vaccines 19-59 Average Risk (1 of 3 - 19+ 3-dose series) Never done   Zoster Vaccines- Shingrix (1 of 2) Never done   Pneumococcal Vaccine: 50+ Years (2 of 2 - PCV) 06/12/2017   DTaP/Tdap/Td (1 - Tdap) 08/23/2024 (Originally 12/15/1992)   Influenza Vaccine  Completed   Hepatitis C Screening  Completed   HIV Screening  Completed   HPV VACCINES  Aged Out   Meningococcal B Vaccine  Aged Out   Colonoscopy  Discontinued   Fecal DNA (Cologuard)  Discontinued    Discussed health benefits of physical activity, and encouraged him to engage in regular exercise appropriate for his age and condition.  Problem List Items Addressed This Visit       Cardiovascular and Mediastinum   CAD (coronary artery disease)   Coronary artery disease with prior stent placement Intermittent chest pain post-stent placement. On dual antiplatelet therapy and other cardiac medications. - Continue aspirin  81 mg daily, Plavix  75 mg daily, rosuvastatin  40 mg daily, metoprolol  25  daily, and isosorbide  mononitrate 30 mg daily. -  refill nitroglycerin  prescription at the pharmacy       Chronic diastolic congestive heart failure (HCC)   Chronic diastolic heart failure - Continue spironolactone  25 mg daily and isosorbide  mononitrate 30 mg daily.  Metoprolol  25 mg daily       Relevant Orders   CBC   CMP14+EGFR     Respiratory   Other emphysema (HCC)   Other emphysema Reports wheezing, managed with inhalers. - Refilled albuterol  inhaler. - Continue Trelegy Ellipta  daily.         Digestive   GERD (gastroesophageal reflux disease)   Gastroesophageal reflux disease Occasional heartburn  and bloating, managed with pantoprazole . - Continue pantoprazole  40 mg daily daily. - Use tums as needed for breakthrough symptoms. - Advised to avoid late-night eating and trigger foods.  Nervous and Auditory   Impacted cerumen, right ear   Procedure note: ear wax lavage done by the CMA Verbal consent obtained right ear irrigated with combination of water  and peroxide Wax successfully removed Pt tolerated well No immediate complication noted         Other   History of tobacco use disorder   Nicotine  dependence in remission Quit smoking recently, occasionally uses nicotine  patches. - Continue nicotine  patches as needed.       Hyperlipidemia     Hyperlipidemia Managed with Zetia  10 mg daily and rosuvastatin  40 mg daily. Lipid levels need reassessment. - Ordered lipid panel.  r.      Relevant Orders   Lipid panel   Annual physical exam - Primary   General Health Maintenance Encouraged lifestyle modifications for overall health. - Encouraged moderate exercise 30 minutes, 5 days a week. - Advised heart healthy low-salt, low-fat diet. - Encouraged 7-8 hours of sleep nightly. - Advised to use seatbelt  - Encouraged Pneumovax 20 and shingles vaccines. - Ordered PSA screening for prostate cancer      Insomnia    Difficulty sleeping, BiPAP issues, advised against Xanax use. - Consult pulmonologist regarding BiPAP machine issues. - Prescribed hydroxyzine  25 mg as needed for sleep at bedtime - Advised against using Xanax without prescription. Sleep hygiene discussed         Relevant Medications   hydrOXYzine  (VISTARIL ) 25 MG capsule   Other Visit Diagnoses       Screening for prostate cancer       Relevant Orders   PSA      Return in about 4 months (around 06/29/2024) for HTN, HYPERLIPIDEMIA.     Rotunda Worden R Annalee Meyerhoff, FNP     [1]  Outpatient Medications Prior to Visit  Medication Sig   acetaminophen  (TYLENOL ) 325 MG tablet Take 2 tablets (650  mg total) by mouth every 4 (four) hours as needed for headache or mild pain (pain score 1-3).   aspirin  EC 81 MG tablet Take 1 tablet (81 mg total) by mouth daily. Swallow whole.   clopidogrel  (PLAVIX ) 75 MG tablet Take 1 tablet (75 mg total) by mouth daily.   EQ NICOTINE  14 MG/24HR patch APPLY 1 PATCH TOPICALLY ONCE DAILY   ezetimibe  (ZETIA ) 10 MG tablet Take 1 tablet (10 mg total) by mouth daily.   Fluticasone -Umeclidin-Vilant (TRELEGY ELLIPTA ) 100-62.5-25 MCG/ACT AEPB Inhale 1 puff into the lungs daily.   isosorbide  mononitrate (IMDUR ) 30 MG 24 hr tablet Take 1 tablet (30 mg total) by mouth daily.   losartan  (COZAAR ) 25 MG tablet Take 1 tablet (25 mg total) by mouth daily.   metoprolol  succinate (TOPROL -XL) 25 MG 24 hr tablet Take 1 tablet (25 mg total) by mouth daily.   nitroGLYCERIN  (NITROSTAT ) 0.4 MG SL tablet PLACE 1 TABLET UNDER THE TONGUE EVERY 5 MINUTES AS NEEDED FOR CHEST PAIN. IF CHEST PAIN NOT RESOLVED AFTER 3 DOSES, PLASE CELL 911)   NON FORMULARY Pt uses a bi-pap nightly   pantoprazole  (PROTONIX ) 40 MG tablet Take 1 tablet by mouth once daily   polyethylene glycol (MIRALAX ) 17 g packet Take 17 g by mouth daily as needed.   rosuvastatin  (CRESTOR ) 40 MG tablet Take 1 tablet (40 mg total) by mouth daily.   spironolactone  (ALDACTONE ) 25 MG tablet Take 1 tablet (25 mg total) by mouth daily.   VENTOLIN  HFA 108 (90 Base) MCG/ACT inhaler Inhale 1-2 puffs into the lungs every 6 (six) hours as needed for wheezing or shortness of  breath.   empagliflozin  (JARDIANCE ) 10 MG TABS tablet Take 1 tablet (10 mg total) by mouth daily. (Patient not taking: Reported on 03/01/2024)   famotidine  (PEPCID ) 20 MG tablet Take 1 tablet (20 mg total) by mouth at bedtime as needed for heartburn or indigestion. (Patient not taking: Reported on 03/01/2024)   [Paused] furosemide  (LASIX ) 40 MG tablet Take 1 tablet (40 mg total) by mouth daily. (Patient not taking: Reported on 03/01/2024)   hydrocortisone  (ANUSOL -HC) 25 MG  suppository Place 1 suppository (25 mg total) rectally 2 (two) times daily. (Patient not taking: Reported on 03/01/2024)   oxyCODONE  (OXY IR/ROXICODONE ) 5 MG immediate release tablet Take 1 tablet (5 mg total) by mouth every 6 (six) hours as needed for severe pain (pain score 7-10). (Patient not taking: Reported on 03/01/2024)   No facility-administered medications prior to visit.   "

## 2024-03-01 NOTE — Assessment & Plan Note (Signed)
 Coronary artery disease with prior stent placement Intermittent chest pain post-stent placement. On dual antiplatelet therapy and other cardiac medications. - Continue aspirin  81 mg daily, Plavix  75 mg daily, rosuvastatin  40 mg daily, metoprolol  25  daily, and isosorbide  mononitrate 30 mg daily. -  refill nitroglycerin  prescription at the pharmacy

## 2024-03-01 NOTE — Assessment & Plan Note (Signed)
 Chronic diastolic heart failure - Continue spironolactone  25 mg daily and isosorbide  mononitrate 30 mg daily.  Metoprolol  25 mg daily

## 2024-03-01 NOTE — Patient Instructions (Addendum)
 Please maintain simple sleep hygiene. - Maintain dark and non-noisy environment in the bedroom. - Please use the bedroom for sleep and sexual activity only. - Do not use electronic devices in the bedroom. - Please take dinner at least 2 hours before bedtime. - Please avoid caffeinated products in the evening, including coffee, soft drinks. - Please try to maintain the regular sleep-wake cycle - Go to bed and wake up at the same time.   Please consider getting Pneumovax 20 vaccine at local pharmacy.     1. Annual physical exam (Primary)  2. Other emphysema (HCC)  3. Hyperlipidemia, unspecified hyperlipidemia type - Lipid panel  4. Chronic diastolic congestive heart failure (HCC) - CBC - CMP14+EGFR  5. Insomnia, unspecified type - hydrOXYzine  (VISTARIL ) 25 MG capsule; Take 1 capsule (25 mg total) by mouth at bedtime as needed.  Dispense: 30 capsule; Refill: 0  6. Screening for prostate cancer - PSA  7. Impacted cerumen, right ear  8. Coronary artery disease involving native coronary artery of native heart without angina pectoris     It is important that you exercise regularly at least 30 minutes 5 times a week as tolerated  Think about what you will eat, plan ahead. Choose  clean, green, fresh or frozen over canned, processed or packaged foods which are more sugary, salty and fatty. 70 to 75% of food eaten should be vegetables and fruit. Three meals at set times with snacks allowed between meals, but they must be fruit or vegetables. Aim to eat over a 12 hour period , example 7 am to 7 pm, and STOP after  your last meal of the day. Drink water ,generally about 64 ounces per day, no other drink is as healthy. Fruit juice is best enjoyed in a healthy way, by EATING the fruit.  Thanks for choosing [facility] we consider it a privelige to serve you.

## 2024-03-01 NOTE — Assessment & Plan Note (Signed)
" °  Difficulty sleeping, BiPAP issues, advised against Xanax use. - Consult pulmonologist regarding BiPAP machine issues. - Prescribed hydroxyzine  25 mg as needed for sleep at bedtime - Advised against using Xanax without prescription. Sleep hygiene discussed    "

## 2024-03-01 NOTE — Assessment & Plan Note (Signed)
 General Health Maintenance Encouraged lifestyle modifications for overall health. - Encouraged moderate exercise 30 minutes, 5 days a week. - Advised heart healthy low-salt, low-fat diet. - Encouraged 7-8 hours of sleep nightly. - Advised to use seatbelt  - Encouraged Pneumovax 20 and shingles vaccines. - Ordered PSA screening for prostate cancer

## 2024-03-01 NOTE — Assessment & Plan Note (Signed)
" ° °  Hyperlipidemia Managed with Zetia  10 mg daily and rosuvastatin  40 mg daily. Lipid levels need reassessment. - Ordered lipid panel.  r. "

## 2024-03-01 NOTE — Assessment & Plan Note (Signed)
 Other emphysema Reports wheezing, managed with inhalers. - Refilled albuterol  inhaler. - Continue Trelegy Ellipta  daily.

## 2024-03-01 NOTE — Assessment & Plan Note (Signed)
 Procedure note: ear wax lavage done by the CMA Verbal consent obtained right ear irrigated with combination of water  and peroxide Wax successfully removed Pt tolerated well No immediate complication noted

## 2024-03-01 NOTE — Assessment & Plan Note (Signed)
 Nicotine  dependence in remission Quit smoking recently, occasionally uses nicotine  patches. - Continue nicotine  patches as needed.

## 2024-03-01 NOTE — Assessment & Plan Note (Signed)
 Gastroesophageal reflux disease Occasional heartburn and bloating, managed with pantoprazole . - Continue pantoprazole  40 mg daily daily. - Use tums as needed for breakthrough symptoms. - Advised to avoid late-night eating and trigger foods.

## 2024-03-02 ENCOUNTER — Ambulatory Visit: Payer: Self-pay | Admitting: Nurse Practitioner

## 2024-03-02 LAB — CMP14+EGFR
ALT: 15 IU/L (ref 0–44)
AST: 20 IU/L (ref 0–40)
Albumin: 3.9 g/dL — ABNORMAL LOW (ref 4.1–5.1)
Alkaline Phosphatase: 99 IU/L (ref 47–123)
BUN/Creatinine Ratio: 5 — ABNORMAL LOW (ref 9–20)
BUN: 8 mg/dL (ref 6–24)
Bilirubin Total: 0.4 mg/dL (ref 0.0–1.2)
CO2: 20 mmol/L (ref 20–29)
Calcium: 9.5 mg/dL (ref 8.7–10.2)
Chloride: 106 mmol/L (ref 96–106)
Creatinine, Ser: 1.46 mg/dL — ABNORMAL HIGH (ref 0.76–1.27)
Globulin, Total: 2.8 g/dL (ref 1.5–4.5)
Glucose: 91 mg/dL (ref 70–99)
Potassium: 4.8 mmol/L (ref 3.5–5.2)
Sodium: 140 mmol/L (ref 134–144)
Total Protein: 6.7 g/dL (ref 6.0–8.5)
eGFR: 58 mL/min/1.73 — ABNORMAL LOW

## 2024-03-02 LAB — PSA: Prostate Specific Ag, Serum: 0.6 ng/mL (ref 0.0–4.0)

## 2024-03-02 LAB — LIPID PANEL
Chol/HDL Ratio: 3.4 ratio (ref 0.0–5.0)
Cholesterol, Total: 106 mg/dL (ref 100–199)
HDL: 31 mg/dL — ABNORMAL LOW
LDL Chol Calc (NIH): 51 mg/dL (ref 0–99)
Triglycerides: 139 mg/dL (ref 0–149)
VLDL Cholesterol Cal: 24 mg/dL (ref 5–40)

## 2024-03-02 LAB — CBC
Hematocrit: 45.9 % (ref 37.5–51.0)
Hemoglobin: 14.6 g/dL (ref 13.0–17.7)
MCH: 26.3 pg — ABNORMAL LOW (ref 26.6–33.0)
MCHC: 31.8 g/dL (ref 31.5–35.7)
MCV: 83 fL (ref 79–97)
Platelets: 393 x10E3/uL (ref 150–450)
RBC: 5.56 x10E6/uL (ref 4.14–5.80)
RDW: 15.8 % — ABNORMAL HIGH (ref 11.6–15.4)
WBC: 9.3 x10E3/uL (ref 3.4–10.8)

## 2024-03-16 NOTE — Progress Notes (Unsigned)
 "  Cardiology Clinic Note   Date: 03/16/2024 ID: Jhamal, Plucinski 18-Dec-1975, MRN [id number]  Primary Cardiologist:  Redell Cave, MD  Chief Complaint   EMIT KUENZEL is a 49 y.o. male who presents to the clinic today for ***  Patient Profile   KAMARION ZAGAMI is followed by *** for the history outlined below.      Past medical history significant for: CAD. LHC 01/24/2020 (NSTEMI): Ostial to proximal RCA 60%.  Proximal to mid RCA 40%.  Mid LAD 90%.  PCI with DES 2.75 x 22 mm to mid LAD.  Mildly reduced LV systolic function with EF 45% with moderate to severe anteroapical hypokinesis.  Moderately elevated LVEDP. LHC 05/20/2021 (NSTEMI): Distal LAD 40%.  Proximal to mid RCA 40%.  Ostial proximal RCA 50%.  RPDA 40%.  Ostial LM 30%.  Mid LAD 85% distal edge in-stent restenosis with extensive plaque distal to the stent involving the whole mid segment.  D2 60%.  PCI with DES 2.75 x 34 mm to mid LAD.  Balloon angioplasty to ostial D2. LHC 11/18/2021 (unstable angina): Ostial 30%.  Distal LAD 40%.  Ostial to proximal RCA 50%.  Proximal mid RCA 40%.  D2 30%.  RPDA 40%.  Mid LAD #1 80%, #3 60%, mid RCA 70%.  Patent overlapping LAD stents with evidence of significant in-stent restenosis in the proximal segment with borderline stenosis distal to the stent.  Recommended CABG versus balloon angioplasty given patient's poor compliance with medical therapy and continued cocaine use. LHC 02/13/2022: Proximal and distal LAD stent: Proximal to mid LAD 70%, jailed D2 80%.  Mid LAD #1 80%, #2 50%, distal LAD had distal stent edge 99%.  Mid RCA #1 70%, #2 95%.  RVEF 60%.  RPDA 60%.  Recommend transfer to [facility] for CTS consult. CABG x 3 02/19/2022: LIMA to diagonal, SVG to LAD, SVG to RCA.  LAA closure. Nuclear stress test 01/27/2023: Abnormal myocardial perfusion test, intermediate risk study.  Moderate in size, moderate severity reversible defect involving the apex concerning for ischemia but cannot  exclude an element of artifact. LHC 02/03/2023 (abnormal stress test): Severe two-vessel native CAD with up to 99% in-stent restenosis of the LAD and multifocal RCA disease of up to 95%.  Overall.  Similar to pre-CABG native CAD.  Widely patent LIMA to D2 and SVG to RCA.  Occluded SVG to LAD.  Recommend addition of isosorbide .  If refractory symptoms consider PTCA/PCI of LAD in-stent restenosis. LHC 11/20/2023 (unstable angina): Proximal RCA #1 70%, #2 70%.  Mid RCA #1 40%, #2 100%.  Distal RCA 40%.  Mid LAD #1 80%, #2 70%.  Distal LAD #1 99%, #2 70%, #3 60%.  SVG to LAD chronically occluded.  RPDA 60%.  RV branch 60%.  D2 95%.  Ostial LAD 40%.  Proximal to mid LAD 70%.  PCI with balloon angioplasty proximal to mid LAD, balloon angioplasty to mid LAD #1 and #2.  PCI with DES 2.5 x 30 mid LAD #2 to distal LAD #1, overlapping DES 2.25 x 26 to distal LAD #1 and #2 which also treats mid LAD #2.  Recommendation for increased dose of isosorbide  secondary to extensive coronary spasm. Chronic diastolic heart failure. Echo 11/23/2023: EF 60 to 65%.  No RWMA.  Moderate asymmetric LVH of the septal segment.  Grade I DD.  Normal RV size/function.  Aortic valve sclerosis/calcification without stenosis. Hypertension. Hyperlipidemia. Lipid panel 09/07/2023: LDL 40, HDL 39, TG 158, total 106. GERD. Emphysema. OSA.  CKD stage IIIa. Tobacco abuse. Polysubstance abuse. Medication nonadherence.  In summary, patient has an extensive cardiac history with multiple PCI and CABG as detailed.  He was admitted to the hospital in October 2021 with progressive weakness, fatigue, dyspnea, cough, diarrhea.  He was found to be COVID-positive.  Troponin peaked at 32.  Echo showed EF 50 to 55%, no RWMA, Grade I DD, normal RV size and function, no valvular abnormalities.  He was readmitted November 2021 with NSTEMI, troponin peaked at 3520.  Echo at that time showed EF 50%, moderate hypokinesis of the mid apical anteroseptal wall, mild  LVH, diastolic dysfunction, normal RV size/function, mild aortic valve sclerosis without stenosis.  He underwent successful PCI with DES to mid LAD.  Lexiscan  October 2022 showed a small region of ischemia in the apical wall MI is overall low risk.    He was admitted to the hospital March 2023 with chest pain, troponin peaked at 125.  UDS positive for cocaine.  Echo showed normal LV/RV function, hypokinesis of the mid to distal anteroseptal/septal and apical region, moderate LVH, Grade I DD, mild MR, aortic valve sclerosis without stenosis.  LHC demonstrated in-stent restenosis as detailed above patient underwent PCI with DES and balloon angioplasty.    He was admitted September 2023 with unstable angina.  He had not been taking his medications.  UDS positive for cocaine.  Echo showed EF 60 to 65%, no RWMA, moderate concentric LVH, Grade I DD, normal RV size/function, trivial MR, aortic valve sclerosis without stenosis.  LHC showed significant in-stent restenosis in the proximal segment of overlapping LAD stents.  IC NTG improved overall appearance of coronary arteries and intervention was deferred secondary to patient's poor compliance and continued cocaine use.  Antianginal therapy was optimized with plan for revascularization if patient remains symptomatic.  He was admitted to the hospital December 2023 with NSTEMI in the setting of continued cocaine use and nonadherence with antiplatelet therapy.  Echo showed normal LV/RV function, no RWMA, mild MR.  LHC showed 2 potential culprit lesion including mid to distal RCA and mid to distal LAD.  He was transferred to [facility] and underwent CABG x 3.  Intraoperative course complicated by hyperkalemia bradycardic arrest prior to initiation of cardiopulmonary bypass with requirement of IABP as well as inotropic and vasopressor support which was continued postoperatively.  Posthospitalization course complicated by left pleural effusion requiring thoracentesis.     Patient was seen for preop evaluation in November 2024.  He reported occasional chest pain and more dyspnea with minimal exertion over a couple of months.  Patient reported inadvertently not taking metoprolol  and losartan  x 2 months he restarted losartan  but wanted to be sure he should take metoprolol .  He reported using cocaine the month prior.  EKG demonstrated more prominent inverted T waves in lateral leads concerning for possible ischemia.  He had an abnormal, intermediate risk nuclear stress test as detailed above.  He then underwent LHC which demonstrated severe two-vessel native CAD and occluded SVG to LAD as detailed above.  He was started on isosorbide .  Upon follow-up in December 2024 he reported improvement in symptoms since starting isosorbide .  He was cleared for his surgery which was performed on 03/06/2023.  Patient was seen in the office on 05/14/2023 for routine follow-up.  He was doing well at that time.  He was exercising and using his BiPAP regularly.  He was working on quitting smoking and had not used cocaine for several months.  Patient was seen  by Dr. Cherrie in advanced heart failure clinic on 06/01/2023 for routine follow-up.  Patient reported postprandial chest pain that was felt to be noncardiac.  He was euvolemic.  Abdominal ultrasound was ordered to evaluate patient's postprandial abdominal pain.  There was no evidence of gallstones and demonstrated findings suspicious for fatty liver.   Patient was seen for routine follow up in September 2025.  He reported increased fatigue and episodes of chest pain x 2 weeks.  He had been going to the gym regularly with rare, brief episodes of chest pain.  At the beginning of September he began feeling more fatigued to the point with that he stopped going to the gym.  He then began having episodes of exertional heavy/dull central chest discomfort that eases with rest and associated with dyspnea.  Episodes occur 3-4 times a week lasting  minutes.  He had an episode the day prior after getting out of the shower that lasted a few hours.  He had taken NTG a few times with relief.  He denied using cocaine for at least 17 months.  He underwent LHC with balloon angioplasty and PCI with overlapping DES on 11/20/2023. His hospital admission was complicated by a groin infection requiring I&D of the right groin abscess. Jardiance  was stopped.  Upon follow up in October he had not yet stopped Jardiance  and was instructed to do so. He had returned to the gym and was slowly building up his exercise tolerance.      History of Present Illness    Today, patient ***  CAD Extensive history with multiple PCI. CABG x 26 January 2022. Patient presented for pre-op evaluation and EKG showed more prominent T-Wave inversion in lateral leads when compared to prior EKG. In December 2024, he complained of more dyspnea with activity Lexiscan  showed evidence of ischemia. He underwent LHC which showed severe two-vessel native CAD with up to 99% in-stent restenosis of the LAD and multifocal RCA disease up to 95%.  Patent LIMA to D2 and SVG to RCA, occluded SVG to LAD.  LHC on 9/26 for unstable angina with balloon angioplasty x 2 and PCI with overlapping stent x 2 (as detailed in patient profile). Patient *** - Continue aspirin , Plavix , rosuvastatin , Toprol , isosorbide , as needed SL NTG.   Chronic diastolic heart failure Echo 11/23/2023 demonstrated normal LV/RV function, moderate asymmetric LVH, Grade I DD, aortic valve sclerosis without stenosis.  Patient *** Euvolemic and well compensated on exam.  - Continue spironolactone , isosorbide , Toprol  and losartan .   - Restart Jardiance  ***    Hypertension BP today ***. No dizziness or headaches reported.   -Continue losartan , Toprol , spironolactone .   Hyperlipidemia LDL 40 July 2025, at goal. -Continue rosuvastatin .   Tobacco abuse Patient *** - Encouraged complete cessation.    OSA Patient reports*** -  Encouraged consistent adherence with BiPAP.  ROS: All other systems reviewed and are otherwise negative except as noted in History of Present Illness.  EKGs/Labs Reviewed        03/01/2024: ALT 15; AST 20; BUN 8; Creatinine, Ser 1.46; Potassium 4.8; Sodium 140   03/01/2024: Hemoglobin 14.6; WBC 9.3   No results found for requested labs within last 365 days.   No results found for requested labs within last 365 days.  ***  Risk Assessment/Calculations    {Does this patient have ATRIAL FIBRILLATION?[phone]} No BP recorded.  {Refresh Note OR Click here to enter BP  :1}***        Physical Exam    VS:  There were no vitals taken for this visit. , BMI There is no height or weight on file to calculate BMI.  GEN: Well nourished, well developed, in no acute distress. Neck: No JVD or carotid bruits. Cardiac: *** RRR. *** No murmur. No rubs or gallops.   Respiratory:  Respirations regular and unlabored. Clear to auscultation without rales, wheezing or rhonchi. GI: Soft, nontender, nondistended. Extremities: Radials/DP/PT 2+ and equal bilaterally. No clubbing or cyanosis. No edema ***  Skin: Warm and dry, no rash. Neuro: Strength intact.  Assessment & Plan   ***  Disposition: ***     {Are you ordering a CV Procedure (e.g. stress test, cath, DCCV, TEE, etc)?   Press F2        :[id number]}   Signed, Barnie HERO. Faige Seely, DNP, NP-C  "

## 2024-03-21 ENCOUNTER — Ambulatory Visit: Attending: Student | Admitting: Student

## 2024-03-24 NOTE — Progress Notes (Signed)
 "  Cardiology Clinic Note   Date: 03/29/2024 ID: Logan Crawford, DOB [birth year], MRN [id number]  Primary Cardiologist:  Redell Cave, MD  Chief Complaint   Logan Crawford is a 49 y.o. male who presents to the clinic today for routine follow up.   Patient Profile   Logan Crawford is followed by Dr. Cave for the history outlined below.      Past medical history significant for: CAD. LHC 01/24/2020 (NSTEMI): Ostial to proximal RCA 60%.  Proximal to mid RCA 40%.  Mid LAD 90%.  PCI with DES 2.75 x 22 mm to mid LAD.  Mildly reduced LV systolic function with EF 45% with moderate to severe anteroapical hypokinesis.  Moderately elevated LVEDP. LHC 05/20/2021 (NSTEMI): Distal LAD 40%.  Proximal to mid RCA 40%.  Ostial proximal RCA 50%.  RPDA 40%.  Ostial LM 30%.  Mid LAD 85% distal edge in-stent restenosis with extensive plaque distal to the stent involving the whole mid segment.  D2 60%.  PCI with DES 2.75 x 34 mm to mid LAD.  Balloon angioplasty to ostial D2. LHC 11/18/2021 (unstable angina): Ostial 30%.  Distal LAD 40%.  Ostial to proximal RCA 50%.  Proximal mid RCA 40%.  D2 30%.  RPDA 40%.  Mid LAD #1 80%, #3 60%, mid RCA 70%.  Patent overlapping LAD stents with evidence of significant in-stent restenosis in the proximal segment with borderline stenosis distal to the stent.  Recommended CABG versus balloon angioplasty given patient's poor compliance with medical therapy and continued cocaine use. LHC 02/13/2022: Proximal and distal LAD stent: Proximal to mid LAD 70%, jailed D2 80%.  Mid LAD #1 80%, #2 50%, distal LAD had distal stent edge 99%.  Mid RCA #1 70%, #2 95%.  RVEF 60%.  RPDA 60%.  Recommend transfer to [facility] for CTS consult. CABG x 3 02/19/2022: LIMA to diagonal, SVG to LAD, SVG to RCA.  LAA closure. Nuclear stress test 01/27/2023: Abnormal myocardial perfusion test, intermediate risk study.  Moderate in size, moderate severity reversible defect involving the apex  concerning for ischemia but cannot exclude an element of artifact. LHC 02/03/2023 (abnormal stress test): Severe two-vessel native CAD with up to 99% in-stent restenosis of the LAD and multifocal RCA disease of up to 95%.  Overall.  Similar to pre-CABG native CAD.  Widely patent LIMA to D2 and SVG to RCA.  Occluded SVG to LAD.  Recommend addition of isosorbide .  If refractory symptoms consider PTCA/PCI of LAD in-stent restenosis. LHC 11/20/2023 (unstable angina): Proximal RCA #1 70%, #2 70%.  Mid RCA #1 40%, #2 100%.  Distal RCA 40%.  Mid LAD #1 80%, #2 70%.  Distal LAD #1 99%, #2 70%, #3 60%.  SVG to LAD chronically occluded.  RPDA 60%.  RV branch 60%.  D2 95%.  Ostial LAD 40%.  Proximal to mid LAD 70%.  PCI with balloon angioplasty proximal to mid LAD, balloon angioplasty to mid LAD #1 and #2.  PCI with DES 2.5 x 30 mid LAD #2 to distal LAD #1, overlapping DES 2.25 x 26 to distal LAD #1 and #2 which also treats mid LAD #2.  Recommendation for increased dose of isosorbide  secondary to extensive coronary spasm. Chronic diastolic heart failure. Echo 11/23/2023: EF 60 to 65%.  No RWMA.  Moderate asymmetric LVH of the septal segment.  Grade I DD.  Normal RV size/function.  Aortic valve sclerosis/calcification without stenosis. Hypertension. Hyperlipidemia. Lipid panel 03/01/2024: LDL 51, HDL 31, TG 139, total 106.  GERD. Emphysema. OSA.  CKD stage IIIa. Tobacco abuse. Polysubstance abuse in remission. Medication nonadherence.  In summary, patient has an extensive cardiac history with multiple PCI and CABG as detailed.  He was admitted to the hospital in October 2021 with progressive weakness, fatigue, dyspnea, cough, diarrhea.  He was found to be COVID-positive.  Troponin peaked at 32.  Echo showed EF 50 to 55%, no RWMA, Grade I DD, normal RV size and function, no valvular abnormalities.  He was readmitted November 2021 with NSTEMI, troponin peaked at 3520.  Echo at that time showed EF 50%, moderate  hypokinesis of the mid apical anteroseptal wall, mild LVH, diastolic dysfunction, normal RV size/function, mild aortic valve sclerosis without stenosis.  He underwent successful PCI with DES to mid LAD.  Lexiscan  October 2022 showed a small region of ischemia in the apical wall MI is overall low risk.    He was admitted to the hospital March 2023 with chest pain, troponin peaked at 125.  UDS positive for cocaine.  Echo showed normal LV/RV function, hypokinesis of the mid to distal anteroseptal/septal and apical region, moderate LVH, Grade I DD, mild MR, aortic valve sclerosis without stenosis.  LHC demonstrated in-stent restenosis as detailed above patient underwent PCI with DES and balloon angioplasty.    He was admitted September 2023 with unstable angina.  He had not been taking his medications.  UDS positive for cocaine.  Echo showed EF 60 to 65%, no RWMA, moderate concentric LVH, Grade I DD, normal RV size/function, trivial MR, aortic valve sclerosis without stenosis.  LHC showed significant in-stent restenosis in the proximal segment of overlapping LAD stents.  IC NTG improved overall appearance of coronary arteries and intervention was deferred secondary to patient's poor compliance and continued cocaine use.  Antianginal therapy was optimized with plan for revascularization if patient remains symptomatic.  He was admitted to the hospital December 2023 with NSTEMI in the setting of continued cocaine use and nonadherence with antiplatelet therapy.  Echo showed normal LV/RV function, no RWMA, mild MR.  LHC showed 2 potential culprit lesion including mid to distal RCA and mid to distal LAD.  He was transferred to [facility] and underwent CABG x 3.  Intraoperative course complicated by hyperkalemia bradycardic arrest prior to initiation of cardiopulmonary bypass with requirement of IABP as well as inotropic and vasopressor support which was continued postoperatively.  Posthospitalization course complicated  by left pleural effusion requiring thoracentesis.    Patient was seen for preop evaluation in November 2024.  He reported occasional chest pain and more dyspnea with minimal exertion over a couple of months.  Patient reported inadvertently not taking metoprolol  and losartan  x 2 months he restarted losartan  but wanted to be sure he should take metoprolol .  He reported using cocaine the month prior.  EKG demonstrated more prominent inverted T waves in lateral leads concerning for possible ischemia.  He had an abnormal, intermediate risk nuclear stress test as detailed above.  He then underwent LHC which demonstrated severe two-vessel native CAD and occluded SVG to LAD as detailed above.  He was started on isosorbide .  Upon follow-up in December 2024 he reported improvement in symptoms since starting isosorbide .  He was cleared for his surgery which was performed on 03/06/2023.  Patient was seen in the office on 05/14/2023 for routine follow-up.  He was doing well at that time.  He was exercising and using his BiPAP regularly.  He was working on quitting smoking and had not used cocaine for  several months.  Patient was seen by Dr. Cherrie in advanced heart failure clinic on 06/01/2023 for routine follow-up.  Patient reported postprandial chest pain that was felt to be noncardiac.  He was euvolemic.  Abdominal ultrasound was ordered to evaluate patient's postprandial abdominal pain.  There was no evidence of gallstones and demonstrated findings suspicious for fatty liver.   Patient was seen for routine follow up in September 2025.  He reported increased fatigue and episodes of chest pain x 2 weeks.  He had been going to the gym regularly with rare, brief episodes of chest pain.  At the beginning of September he began feeling more fatigued to the point with that he stopped going to the gym.  He then began having episodes of exertional heavy/dull central chest discomfort that eases with rest and associated with  dyspnea.  Episodes occur 3-4 times a week lasting minutes.  He had an episode the day prior after getting out of the shower that lasted a few hours.  He had taken NTG a few times with relief.  He denied using cocaine for at least 17 months.  He underwent LHC with balloon angioplasty and PCI with overlapping DES on 11/20/2023. His hospital admission was complicated by a groin infection requiring I&D of the right groin abscess. Jardiance  was stopped.  Upon follow up in October he had not yet stopped Jardiance  and was instructed to do so. He had returned to the gym and was slowly building up his exercise tolerance.      History of Present Illness    Today, patient is here alone. He reports a little more fatigue than following his heart cath in September. He has been mostly sedentary in the last 2-3 weeks secondary to the snow. He is also fighting a head cold with increased nasal drainage and a mild cough. Prior to the bad weather he was going to the gym 2-3 times a week with good tolerance. Patient denies shortness of breath, dyspnea on exertion, lower extremity edema, orthopnea or PND. No chest pain, pressure, or tightness. No palpitations. He has been unable to use his BiPAP secondary to the head cold. He reports some increased reflux symptoms and realized he has not been taking Pepcid .     ROS: All other systems reviewed and are otherwise negative except as noted in History of Present Illness.  EKGs/Labs Reviewed       EKG is not performed today.   03/01/2024: ALT 15; AST 20; BUN 8; Creatinine, Ser 1.46; Potassium 4.8; Sodium 140   03/01/2024: Hemoglobin 14.6; WBC 9.3    Physical Exam    VS:  BP 126/84 (BP Location: Left Arm, Patient Position: Sitting, Cuff Size: Normal)   Pulse 94   Ht 6' (1.829 m)   Wt 242 lb 12.8 oz (110.1 kg)   SpO2 98%   BMI 32.93 kg/m  , BMI Body mass index is 32.93 kg/m.  GEN: Well nourished, well developed, in no acute distress. Neck: No JVD or carotid  bruits. Cardiac:  RRR.  No murmur. No rubs or gallops.   Respiratory:  Respirations regular and unlabored. Clear to auscultation without rales, wheezing or rhonchi. GI: Soft, nontender, nondistended. Extremities: Radials/DP/PT 2+ and equal bilaterally. No clubbing or cyanosis. No edema   Skin: Warm and dry, no rash. Neuro: Strength intact.  Assessment & Plan   CAD Extensive history with multiple PCI. CABG x 26 January 2022. Patient presented for pre-op evaluation and EKG showed more prominent T-Wave inversion in  lateral leads when compared to prior EKG. In December 2024, he complained of more dyspnea with activity Lexiscan  showed evidence of ischemia. He underwent LHC which showed severe two-vessel native CAD with up to 99% in-stent restenosis of the LAD and multifocal RCA disease up to 95%.  Patent LIMA to D2 and SVG to RCA, occluded SVG to LAD.  LHC on 9/26 for unstable angina with balloon angioplasty x 2 and PCI with overlapping stent x 2 (as detailed in patient profile). Patient reports mildly increased fatigue over the last couple of days. He has been mostly sedentary for the last 2-3 weeks secondary to the weather. He is also fighting a head cold with increased nasal drainage and mild cough. He has increased reflux symptoms and realized he has not been taking Pepcid . Prior to the bad weather he was going to the gym 2-3 times a week with good tolerance.  - One time Rx for Pepcid  20 mg daily. He will follow up with PCP for refills.  - Continue aspirin , Plavix , rosuvastatin , Toprol , isosorbide , as needed SL NTG.   Chronic diastolic heart failure Echo 11/23/2023 demonstrated normal LV/RV function, moderate asymmetric LVH, Grade I DD, aortic valve sclerosis without stenosis.  Patient denies lower extremity edema, orthopnea or PND. He has not been weighing at home.  Euvolemic and well compensated on exam.  - Encouraged weighing at least 3-4 times a week.  - Continue spironolactone , isosorbide ,  Toprol  and losartan .     Hypertension BP today 126/84. No dizziness or headaches reported.   -Continue losartan , Toprol , spironolactone .   Hyperlipidemia LDL 51 January 2026, at goal. -Continue rosuvastatin .   Tobacco abuse Patient reports significantly reducing cigarettes. He is only smoking a few a day.  - Encouraged complete cessation.    OSA Patient reports he was using BiPAP until he started getting a head cold.  - Encouraged consistent adherence with BiPAP.  CKD Patient is being followed by nephrology in [place]. His last Crt was 1.46 and he was instructed to increased hydration. He states he has been drinking more.  - BMP today.   Disposition: BMP today. One time Rx for Pepcid  20 mg daily. Return in 6 months or sooner as needed.          Signed, Barnie HERO. Marquay Kruse, DNP, NP-C  "

## 2024-03-29 ENCOUNTER — Ambulatory Visit: Admitting: Student

## 2024-03-29 ENCOUNTER — Encounter: Payer: Self-pay | Admitting: Student

## 2024-03-29 ENCOUNTER — Other Ambulatory Visit (HOSPITAL_COMMUNITY): Payer: Self-pay

## 2024-03-29 VITALS — BP 126/84 | HR 94 | Ht 72.0 in | Wt 242.8 lb

## 2024-03-29 DIAGNOSIS — N183 Chronic kidney disease, stage 3 unspecified: Secondary | ICD-10-CM | POA: Diagnosis not present

## 2024-03-29 DIAGNOSIS — I10 Essential (primary) hypertension: Secondary | ICD-10-CM | POA: Diagnosis not present

## 2024-03-29 DIAGNOSIS — E785 Hyperlipidemia, unspecified: Secondary | ICD-10-CM

## 2024-03-29 DIAGNOSIS — Z720 Tobacco use: Secondary | ICD-10-CM

## 2024-03-29 DIAGNOSIS — Z79899 Other long term (current) drug therapy: Secondary | ICD-10-CM

## 2024-03-29 DIAGNOSIS — G4733 Obstructive sleep apnea (adult) (pediatric): Secondary | ICD-10-CM | POA: Diagnosis not present

## 2024-03-29 DIAGNOSIS — K219 Gastro-esophageal reflux disease without esophagitis: Secondary | ICD-10-CM

## 2024-03-29 DIAGNOSIS — I5032 Chronic diastolic (congestive) heart failure: Secondary | ICD-10-CM | POA: Diagnosis not present

## 2024-03-29 DIAGNOSIS — I25810 Atherosclerosis of coronary artery bypass graft(s) without angina pectoris: Secondary | ICD-10-CM

## 2024-03-29 MED ORDER — FAMOTIDINE 20 MG PO TABS
20.0000 mg | ORAL_TABLET | Freq: Every day | ORAL | 0 refills | Status: AC
  Filled 2024-03-29: qty 30, 30d supply, fill #0

## 2024-03-29 NOTE — Patient Instructions (Signed)
 Medication Instructions:  Your physician recommends the following medication changes.   START TAKING: Pepcid  20 mg by mouth daily    *If you need a refill on your cardiac medications before your next appointment, please call your pharmacy*  Lab Work: Your provider would like for you to have following labs drawn today BMP.     Testing/Procedures: No test ordered today   Follow-Up: At [facility], you and your health needs are our priority.  As part of our continuing mission to provide you with exceptional heart care, our providers are all part of one team.  This team includes your primary Cardiologist (physician) and Advanced Practice Providers or APPs (Physician Assistants and Nurse Practitioners) who all work together to provide you with the care you need, when you need it.  Your next appointment:   6 month(s)  Provider:   Barnie Hila, NP

## 2024-03-30 ENCOUNTER — Ambulatory Visit: Payer: Self-pay | Admitting: Student

## 2024-03-30 LAB — BASIC METABOLIC PANEL WITH GFR
BUN/Creatinine Ratio: 8 — ABNORMAL LOW (ref 9–20)
BUN: 12 mg/dL (ref 6–24)
CO2: 19 mmol/L — ABNORMAL LOW (ref 20–29)
Calcium: 9.3 mg/dL (ref 8.7–10.2)
Chloride: 104 mmol/L (ref 96–106)
Creatinine, Ser: 1.46 mg/dL — ABNORMAL HIGH (ref 0.76–1.27)
Glucose: 166 mg/dL — ABNORMAL HIGH (ref 70–99)
Potassium: 4.9 mmol/L (ref 3.5–5.2)
Sodium: 139 mmol/L (ref 134–144)
eGFR: 58 mL/min/{1.73_m2} — ABNORMAL LOW

## 2024-06-29 ENCOUNTER — Ambulatory Visit: Admitting: Cardiovascular Disease

## 2024-06-29 ENCOUNTER — Ambulatory Visit: Payer: Self-pay | Admitting: Nurse Practitioner
